# Patient Record
Sex: Male | Born: 1975 | Race: White | Hispanic: No | Marital: Single | Smoking: Former smoker
Health system: Southern US, Community
[De-identification: ages and names within clinical notes are randomized; demographics above are authoritative.]

## PROBLEM LIST (undated history)

## (undated) DIAGNOSIS — F329 Major depressive disorder, single episode, unspecified: Secondary | ICD-10-CM

## (undated) DIAGNOSIS — I10 Essential (primary) hypertension: Secondary | ICD-10-CM

## (undated) DIAGNOSIS — J449 Chronic obstructive pulmonary disease, unspecified: Secondary | ICD-10-CM

## (undated) DIAGNOSIS — K219 Gastro-esophageal reflux disease without esophagitis: Secondary | ICD-10-CM

## (undated) DIAGNOSIS — D649 Anemia, unspecified: Secondary | ICD-10-CM

## (undated) DIAGNOSIS — I82409 Acute embolism and thrombosis of unspecified deep veins of unspecified lower extremity: Secondary | ICD-10-CM

## (undated) DIAGNOSIS — F32A Depression, unspecified: Secondary | ICD-10-CM

## (undated) DIAGNOSIS — Z720 Tobacco use: Secondary | ICD-10-CM

## (undated) DIAGNOSIS — Z8719 Personal history of other diseases of the digestive system: Secondary | ICD-10-CM

## (undated) DIAGNOSIS — G2581 Restless legs syndrome: Secondary | ICD-10-CM

## (undated) DIAGNOSIS — Z87442 Personal history of urinary calculi: Secondary | ICD-10-CM

## (undated) DIAGNOSIS — N186 End stage renal disease: Secondary | ICD-10-CM

## (undated) DIAGNOSIS — I2699 Other pulmonary embolism without acute cor pulmonale: Secondary | ICD-10-CM

## (undated) DIAGNOSIS — F1010 Alcohol abuse, uncomplicated: Secondary | ICD-10-CM

## (undated) DIAGNOSIS — I712 Thoracic aortic aneurysm, without rupture: Secondary | ICD-10-CM

## (undated) DIAGNOSIS — J42 Unspecified chronic bronchitis: Secondary | ICD-10-CM

## (undated) DIAGNOSIS — Q613 Polycystic kidney, unspecified: Secondary | ICD-10-CM

## (undated) DIAGNOSIS — B20 Human immunodeficiency virus [HIV] disease: Secondary | ICD-10-CM

## (undated) DIAGNOSIS — G8929 Other chronic pain: Secondary | ICD-10-CM

## (undated) DIAGNOSIS — Z992 Dependence on renal dialysis: Secondary | ICD-10-CM

## (undated) DIAGNOSIS — R55 Syncope and collapse: Secondary | ICD-10-CM

## (undated) DIAGNOSIS — G629 Polyneuropathy, unspecified: Secondary | ICD-10-CM

## (undated) DIAGNOSIS — M549 Dorsalgia, unspecified: Secondary | ICD-10-CM

## (undated) HISTORY — DX: Polycystic kidney, unspecified: Q61.3

## (undated) HISTORY — PX: VASCULAR SURGERY: SHX849

## (undated) HISTORY — DX: Other pulmonary embolism without acute cor pulmonale: I26.99

## (undated) HISTORY — DX: Alcohol abuse, uncomplicated: F10.10

## (undated) HISTORY — DX: Anemia, unspecified: D64.9

## (undated) HISTORY — DX: Tobacco use: Z72.0

## (undated) HISTORY — DX: Human immunodeficiency virus (HIV) disease: B20

## (undated) HISTORY — PX: NEPHRECTOMY: SHX65

## (undated) HISTORY — DX: Essential (primary) hypertension: I10

## (undated) HISTORY — DX: Thoracic aortic aneurysm, without rupture: I71.2

---

## 1998-06-12 ENCOUNTER — Emergency Department (HOSPITAL_COMMUNITY): Admission: EM | Admit: 1998-06-12 | Discharge: 1998-06-12 | Payer: Self-pay | Admitting: Emergency Medicine

## 1998-08-24 ENCOUNTER — Emergency Department (HOSPITAL_COMMUNITY): Admission: EM | Admit: 1998-08-24 | Discharge: 1998-08-24 | Payer: Self-pay | Admitting: Emergency Medicine

## 1998-08-24 ENCOUNTER — Encounter: Payer: Self-pay | Admitting: Emergency Medicine

## 2000-06-01 ENCOUNTER — Emergency Department (HOSPITAL_COMMUNITY): Admission: EM | Admit: 2000-06-01 | Discharge: 2000-06-01 | Payer: Self-pay | Admitting: Emergency Medicine

## 2000-06-01 ENCOUNTER — Encounter: Payer: Self-pay | Admitting: Emergency Medicine

## 2000-06-13 ENCOUNTER — Encounter: Payer: Self-pay | Admitting: Emergency Medicine

## 2000-06-13 ENCOUNTER — Emergency Department (HOSPITAL_COMMUNITY): Admission: EM | Admit: 2000-06-13 | Discharge: 2000-06-13 | Payer: Self-pay | Admitting: Emergency Medicine

## 2000-11-04 ENCOUNTER — Emergency Department (HOSPITAL_COMMUNITY): Admission: EM | Admit: 2000-11-04 | Discharge: 2000-11-04 | Payer: Self-pay | Admitting: Emergency Medicine

## 2000-11-04 ENCOUNTER — Encounter: Payer: Self-pay | Admitting: Emergency Medicine

## 2001-06-11 ENCOUNTER — Emergency Department (HOSPITAL_COMMUNITY): Admission: EM | Admit: 2001-06-11 | Discharge: 2001-06-11 | Payer: Self-pay | Admitting: Emergency Medicine

## 2001-06-11 ENCOUNTER — Encounter: Payer: Self-pay | Admitting: Emergency Medicine

## 2001-06-22 ENCOUNTER — Emergency Department (HOSPITAL_COMMUNITY): Admission: EM | Admit: 2001-06-22 | Discharge: 2001-06-22 | Payer: Self-pay | Admitting: Emergency Medicine

## 2001-07-05 ENCOUNTER — Emergency Department (HOSPITAL_COMMUNITY): Admission: EM | Admit: 2001-07-05 | Discharge: 2001-07-05 | Payer: Self-pay | Admitting: Emergency Medicine

## 2001-07-21 ENCOUNTER — Encounter: Payer: Self-pay | Admitting: Emergency Medicine

## 2001-07-21 ENCOUNTER — Emergency Department (HOSPITAL_COMMUNITY): Admission: EM | Admit: 2001-07-21 | Discharge: 2001-07-21 | Payer: Self-pay | Admitting: Emergency Medicine

## 2003-11-15 ENCOUNTER — Emergency Department (HOSPITAL_COMMUNITY): Admission: EM | Admit: 2003-11-15 | Discharge: 2003-11-15 | Payer: Self-pay | Admitting: Emergency Medicine

## 2003-11-28 ENCOUNTER — Emergency Department (HOSPITAL_COMMUNITY): Admission: EM | Admit: 2003-11-28 | Discharge: 2003-11-28 | Payer: Self-pay | Admitting: Emergency Medicine

## 2005-10-19 ENCOUNTER — Emergency Department (HOSPITAL_COMMUNITY): Admission: EM | Admit: 2005-10-19 | Discharge: 2005-10-19 | Payer: Self-pay | Admitting: Emergency Medicine

## 2005-11-04 ENCOUNTER — Emergency Department (HOSPITAL_COMMUNITY): Admission: EM | Admit: 2005-11-04 | Discharge: 2005-11-04 | Payer: Self-pay | Admitting: Emergency Medicine

## 2007-10-28 ENCOUNTER — Emergency Department (HOSPITAL_COMMUNITY): Admission: EM | Admit: 2007-10-28 | Discharge: 2007-10-28 | Payer: Self-pay | Admitting: Emergency Medicine

## 2009-06-05 ENCOUNTER — Emergency Department (HOSPITAL_COMMUNITY): Admission: EM | Admit: 2009-06-05 | Discharge: 2009-06-05 | Payer: Self-pay | Admitting: Emergency Medicine

## 2009-07-01 ENCOUNTER — Emergency Department (HOSPITAL_COMMUNITY): Admission: EM | Admit: 2009-07-01 | Discharge: 2009-07-01 | Payer: Self-pay | Admitting: Emergency Medicine

## 2009-08-08 ENCOUNTER — Emergency Department (HOSPITAL_COMMUNITY): Admission: EM | Admit: 2009-08-08 | Discharge: 2009-08-08 | Payer: Self-pay | Admitting: Emergency Medicine

## 2010-02-28 ENCOUNTER — Ambulatory Visit: Payer: Self-pay | Admitting: Internal Medicine

## 2010-02-28 DIAGNOSIS — B20 Human immunodeficiency virus [HIV] disease: Secondary | ICD-10-CM

## 2010-02-28 LAB — CONVERTED CEMR LAB
ALT: 20 units/L (ref 0–53)
AST: 33 units/L (ref 0–37)
BUN: 30 mg/dL — ABNORMAL HIGH (ref 6–23)
Basophils Relative: 1 % (ref 0–1)
Bilirubin Urine: NEGATIVE
Calcium: 9.1 mg/dL (ref 8.4–10.5)
Chlamydia, Swab/Urine, PCR: NEGATIVE
Chloride: 101 meq/L (ref 96–112)
Cholesterol: 178 mg/dL (ref 0–200)
Creatinine, Ser: 1.84 mg/dL — ABNORMAL HIGH (ref 0.40–1.50)
Eosinophils Absolute: 0.1 10*3/uL (ref 0.0–0.7)
Eosinophils Relative: 1 % (ref 0–5)
HCT: 45.2 % (ref 39.0–52.0)
HCV Ab: NEGATIVE
HDL: 77 mg/dL (ref >39)
HIV 1 RNA Quant: 21300 copies/mL — ABNORMAL HIGH (ref <48)
HIV-2 Ab: UNDETERMINED — AB
HIV: REACTIVE
Hepatitis B Surface Ag: NEGATIVE
Lymphs Abs: 1.5 10*3/uL (ref 0.7–4.0)
MCHC: 33.8 g/dL (ref 30.0–36.0)
MCV: 98.7 fL (ref >=78.0)
Neutrophils Relative %: 60 % (ref 43–77)
Platelets: 168 10*3/uL (ref 150–400)
Protein, ur: >300 mg/dL — AB
RDW: 13.3 % (ref 11.5–15.5)
Total Bilirubin: 0.5 mg/dL (ref 0.3–1.2)
Total CHOL/HDL Ratio: 2.3
Urine Glucose: NEGATIVE mg/dL
Urobilinogen, UA: 1 (ref 0.0–1.0)
VLDL: 34 mg/dL (ref 0–40)

## 2010-03-07 ENCOUNTER — Emergency Department (HOSPITAL_COMMUNITY): Admission: EM | Admit: 2010-03-07 | Discharge: 2010-03-08 | Payer: Self-pay | Admitting: Emergency Medicine

## 2010-03-13 ENCOUNTER — Emergency Department (HOSPITAL_COMMUNITY): Admission: EM | Admit: 2010-03-13 | Discharge: 2010-03-13 | Payer: Self-pay | Admitting: Emergency Medicine

## 2010-03-16 DIAGNOSIS — B20 Human immunodeficiency virus [HIV] disease: Secondary | ICD-10-CM

## 2010-03-16 DIAGNOSIS — Z21 Asymptomatic human immunodeficiency virus [HIV] infection status: Secondary | ICD-10-CM

## 2010-03-16 HISTORY — DX: Human immunodeficiency virus (HIV) disease: B20

## 2010-03-16 HISTORY — DX: Asymptomatic human immunodeficiency virus (hiv) infection status: Z21

## 2010-03-23 ENCOUNTER — Ambulatory Visit: Payer: Self-pay | Admitting: Internal Medicine

## 2010-03-23 DIAGNOSIS — M25569 Pain in unspecified knee: Secondary | ICD-10-CM

## 2010-03-23 DIAGNOSIS — Q613 Polycystic kidney, unspecified: Secondary | ICD-10-CM

## 2010-03-27 ENCOUNTER — Encounter: Payer: Self-pay | Admitting: Internal Medicine

## 2010-04-30 ENCOUNTER — Encounter (INDEPENDENT_AMBULATORY_CARE_PROVIDER_SITE_OTHER): Payer: Self-pay | Admitting: *Deleted

## 2010-05-04 ENCOUNTER — Emergency Department (HOSPITAL_COMMUNITY): Admission: EM | Admit: 2010-05-04 | Discharge: 2010-05-04 | Payer: Self-pay | Admitting: Emergency Medicine

## 2010-05-16 ENCOUNTER — Encounter (INDEPENDENT_AMBULATORY_CARE_PROVIDER_SITE_OTHER): Payer: Self-pay | Admitting: *Deleted

## 2010-06-12 ENCOUNTER — Emergency Department (HOSPITAL_COMMUNITY): Admission: EM | Admit: 2010-06-12 | Discharge: 2010-06-12 | Payer: Self-pay | Admitting: Emergency Medicine

## 2010-06-16 ENCOUNTER — Emergency Department (HOSPITAL_COMMUNITY): Admission: EM | Admit: 2010-06-16 | Discharge: 2010-06-17 | Payer: Self-pay | Admitting: Emergency Medicine

## 2010-06-20 ENCOUNTER — Ambulatory Visit: Payer: Self-pay | Admitting: Internal Medicine

## 2010-06-20 ENCOUNTER — Telehealth: Payer: Self-pay

## 2010-06-20 LAB — CONVERTED CEMR LAB: HIV-1 RNA Quant, Log: 4.76 — ABNORMAL HIGH (ref <1.68)

## 2010-06-21 ENCOUNTER — Encounter: Payer: Self-pay | Admitting: Internal Medicine

## 2010-06-21 LAB — CONVERTED CEMR LAB
AST: 23 units/L (ref 0–37)
Alkaline Phosphatase: 65 units/L (ref 39–117)
Basophils Absolute: 0 10*3/uL (ref 0.0–0.1)
Basophils Relative: 0 % (ref 0–1)
Glucose, Bld: 84 mg/dL (ref 70–99)
Hemoglobin: 15.6 g/dL (ref 13.0–17.0)
MCHC: 33.8 g/dL (ref 30.0–36.0)
Monocytes Absolute: 0.7 10*3/uL (ref 0.1–1.0)
Neutro Abs: 2.9 10*3/uL (ref 1.7–7.7)
Neutrophils Relative %: 56 % (ref 43–77)
Platelets: 155 10*3/uL (ref 150–400)
RDW: 14.5 % (ref 11.5–15.5)
Sodium: 137 meq/L (ref 135–145)
Total Bilirubin: 0.6 mg/dL (ref 0.3–1.2)
Total Protein: 7.6 g/dL (ref 6.0–8.3)

## 2010-07-04 ENCOUNTER — Ambulatory Visit: Payer: Self-pay | Admitting: Internal Medicine

## 2010-07-16 ENCOUNTER — Ambulatory Visit (HOSPITAL_COMMUNITY): Admission: RE | Admit: 2010-07-16 | Discharge: 2010-07-16 | Payer: Self-pay | Admitting: Internal Medicine

## 2010-07-16 ENCOUNTER — Telehealth: Payer: Self-pay | Admitting: Internal Medicine

## 2010-07-23 ENCOUNTER — Telehealth: Payer: Self-pay | Admitting: Internal Medicine

## 2010-08-10 ENCOUNTER — Telehealth: Payer: Self-pay | Admitting: Internal Medicine

## 2010-08-13 ENCOUNTER — Ambulatory Visit: Payer: Self-pay | Admitting: Internal Medicine

## 2010-08-13 DIAGNOSIS — F322 Major depressive disorder, single episode, severe without psychotic features: Secondary | ICD-10-CM | POA: Insufficient documentation

## 2010-08-13 DIAGNOSIS — R21 Rash and other nonspecific skin eruption: Secondary | ICD-10-CM

## 2010-08-13 LAB — CONVERTED CEMR LAB

## 2010-08-14 ENCOUNTER — Encounter: Payer: Self-pay | Admitting: Internal Medicine

## 2010-08-15 ENCOUNTER — Telehealth (INDEPENDENT_AMBULATORY_CARE_PROVIDER_SITE_OTHER): Payer: Self-pay | Admitting: *Deleted

## 2010-08-29 ENCOUNTER — Telehealth: Payer: Self-pay | Admitting: Internal Medicine

## 2010-09-03 ENCOUNTER — Emergency Department (HOSPITAL_COMMUNITY): Admission: EM | Admit: 2010-09-03 | Discharge: 2010-09-03 | Payer: Self-pay | Admitting: Emergency Medicine

## 2010-09-03 ENCOUNTER — Telehealth: Payer: Self-pay | Admitting: Internal Medicine

## 2010-09-04 ENCOUNTER — Telehealth: Payer: Self-pay | Admitting: Internal Medicine

## 2010-09-07 ENCOUNTER — Telehealth: Payer: Self-pay | Admitting: Internal Medicine

## 2010-09-15 DIAGNOSIS — I712 Thoracic aortic aneurysm, without rupture, unspecified: Secondary | ICD-10-CM

## 2010-09-15 DIAGNOSIS — I2699 Other pulmonary embolism without acute cor pulmonale: Secondary | ICD-10-CM

## 2010-09-15 HISTORY — DX: Thoracic aortic aneurysm, without rupture: I71.2

## 2010-09-15 HISTORY — DX: Other pulmonary embolism without acute cor pulmonale: I26.99

## 2010-09-15 HISTORY — DX: Thoracic aortic aneurysm, without rupture, unspecified: I71.20

## 2010-09-22 ENCOUNTER — Inpatient Hospital Stay (HOSPITAL_COMMUNITY): Admission: AC | Admit: 2010-09-22 | Discharge: 2010-09-30 | Payer: Self-pay | Admitting: Emergency Medicine

## 2010-09-22 ENCOUNTER — Encounter: Payer: Self-pay | Admitting: Internal Medicine

## 2010-09-22 ENCOUNTER — Ambulatory Visit: Payer: Self-pay | Admitting: Cardiovascular Disease

## 2010-09-22 ENCOUNTER — Ambulatory Visit: Payer: Self-pay | Admitting: Internal Medicine

## 2010-09-23 ENCOUNTER — Encounter: Payer: Self-pay | Admitting: Internal Medicine

## 2010-09-27 ENCOUNTER — Telehealth: Payer: Self-pay | Admitting: Internal Medicine

## 2010-09-28 ENCOUNTER — Telehealth (INDEPENDENT_AMBULATORY_CARE_PROVIDER_SITE_OTHER): Payer: Self-pay | Admitting: *Deleted

## 2010-09-30 ENCOUNTER — Encounter: Payer: Self-pay | Admitting: Internal Medicine

## 2010-09-30 DIAGNOSIS — I2699 Other pulmonary embolism without acute cor pulmonale: Secondary | ICD-10-CM

## 2010-10-01 ENCOUNTER — Ambulatory Visit: Payer: Self-pay | Admitting: Internal Medicine

## 2010-10-01 DIAGNOSIS — I10 Essential (primary) hypertension: Secondary | ICD-10-CM

## 2010-10-01 DIAGNOSIS — I712 Thoracic aortic aneurysm, without rupture, unspecified: Secondary | ICD-10-CM | POA: Insufficient documentation

## 2010-10-01 DIAGNOSIS — F17200 Nicotine dependence, unspecified, uncomplicated: Secondary | ICD-10-CM

## 2010-10-01 LAB — CONVERTED CEMR LAB: INR: 2.9

## 2010-10-02 ENCOUNTER — Ambulatory Visit: Payer: Self-pay | Admitting: Internal Medicine

## 2010-10-02 LAB — CONVERTED CEMR LAB
HIV 1 RNA Quant: 173 copies/mL — ABNORMAL HIGH (ref <20)
HIV-1 RNA Quant, Log: 2.24 — ABNORMAL HIGH (ref <1.30)

## 2010-10-03 ENCOUNTER — Encounter: Payer: Self-pay | Admitting: Internal Medicine

## 2010-10-04 ENCOUNTER — Encounter: Payer: Self-pay | Admitting: Internal Medicine

## 2010-10-04 DIAGNOSIS — J984 Other disorders of lung: Secondary | ICD-10-CM

## 2010-10-04 LAB — CONVERTED CEMR LAB
ALT: 66 units/L — ABNORMAL HIGH (ref 0–53)
AST: 49 units/L — ABNORMAL HIGH (ref 0–37)
Albumin: 4.1 g/dL (ref 3.5–5.2)
Basophils Absolute: 0 10*3/uL (ref 0.0–0.1)
CO2: 22 meq/L (ref 19–32)
Calcium: 8.7 mg/dL (ref 8.4–10.5)
Chloride: 104 meq/L (ref 96–112)
Eosinophils Relative: 3 % (ref 0–5)
Lymphocytes Relative: 50 % — ABNORMAL HIGH (ref 12–46)
Lymphs Abs: 1.6 10*3/uL (ref 0.7–4.0)
Neutro Abs: 1.1 10*3/uL — ABNORMAL LOW (ref 1.7–7.7)
Neutrophils Relative %: 35 % — ABNORMAL LOW (ref 43–77)
Platelets: 235 10*3/uL (ref 150–400)
Potassium: 5.1 meq/L (ref 3.5–5.3)
RDW: 18.5 % — ABNORMAL HIGH (ref 11.5–15.5)
Total Protein: 7.7 g/dL (ref 6.0–8.3)
WBC: 3.2 10*3/uL — ABNORMAL LOW (ref 4.0–10.5)

## 2010-10-05 ENCOUNTER — Telehealth: Payer: Self-pay | Admitting: Licensed Clinical Social Worker

## 2010-10-08 ENCOUNTER — Telehealth (INDEPENDENT_AMBULATORY_CARE_PROVIDER_SITE_OTHER): Payer: Self-pay | Admitting: *Deleted

## 2010-10-08 ENCOUNTER — Ambulatory Visit: Payer: Self-pay | Admitting: Internal Medicine

## 2010-10-08 ENCOUNTER — Telehealth: Payer: Self-pay | Admitting: Internal Medicine

## 2010-10-08 ENCOUNTER — Encounter: Payer: Self-pay | Admitting: Internal Medicine

## 2010-10-09 ENCOUNTER — Encounter: Payer: Self-pay | Admitting: Licensed Clinical Social Worker

## 2010-10-16 ENCOUNTER — Ambulatory Visit: Payer: Self-pay | Admitting: Internal Medicine

## 2010-10-17 ENCOUNTER — Telehealth: Payer: Self-pay | Admitting: Internal Medicine

## 2010-10-26 ENCOUNTER — Telehealth: Payer: Self-pay | Admitting: Internal Medicine

## 2010-10-26 ENCOUNTER — Encounter: Payer: Self-pay | Admitting: Internal Medicine

## 2010-11-07 ENCOUNTER — Ambulatory Visit: Payer: Self-pay | Admitting: Internal Medicine

## 2010-11-07 DIAGNOSIS — S335XXA Sprain of ligaments of lumbar spine, initial encounter: Secondary | ICD-10-CM

## 2010-11-07 DIAGNOSIS — F1010 Alcohol abuse, uncomplicated: Secondary | ICD-10-CM | POA: Insufficient documentation

## 2010-11-07 DIAGNOSIS — K029 Dental caries, unspecified: Secondary | ICD-10-CM | POA: Insufficient documentation

## 2010-11-07 LAB — CONVERTED CEMR LAB: INR: 2.4

## 2010-11-12 ENCOUNTER — Ambulatory Visit: Payer: Self-pay | Admitting: Internal Medicine

## 2010-11-13 ENCOUNTER — Telehealth: Payer: Self-pay | Admitting: Internal Medicine

## 2010-11-23 ENCOUNTER — Encounter: Payer: Self-pay | Admitting: Internal Medicine

## 2010-12-03 ENCOUNTER — Ambulatory Visit: Payer: Self-pay | Admitting: Internal Medicine

## 2010-12-03 LAB — CONVERTED CEMR LAB

## 2010-12-07 ENCOUNTER — Emergency Department (HOSPITAL_COMMUNITY)
Admission: EM | Admit: 2010-12-07 | Discharge: 2010-12-07 | Payer: Self-pay | Source: Home / Self Care | Admitting: Emergency Medicine

## 2010-12-17 ENCOUNTER — Ambulatory Visit: Payer: Self-pay | Admitting: Internal Medicine

## 2010-12-17 DIAGNOSIS — N186 End stage renal disease: Secondary | ICD-10-CM | POA: Insufficient documentation

## 2010-12-19 ENCOUNTER — Telehealth (INDEPENDENT_AMBULATORY_CARE_PROVIDER_SITE_OTHER): Payer: Self-pay | Admitting: *Deleted

## 2010-12-23 LAB — CONVERTED CEMR LAB
BUN: 46 mg/dL — ABNORMAL HIGH (ref 6–23)
CO2: 23 meq/L (ref 19–32)
Creatinine, Urine: 56.5 mg/dL
Glucose, Bld: 100 mg/dL — ABNORMAL HIGH (ref 70–99)
Hemoglobin: 11.2 g/dL — ABNORMAL LOW (ref 13.0–17.0)
Lymphs Abs: 1.6 10*3/uL (ref 0.7–4.0)
MCV: 113.1 fL — ABNORMAL HIGH (ref 78.0–100.0)
Monocytes Absolute: 0.3 10*3/uL (ref 0.1–1.0)
Monocytes Relative: 8 % (ref 3–12)
Neutro Abs: 2.3 10*3/uL (ref 1.7–7.7)
Neutrophils Relative %: 54 % (ref 43–77)
RBC: 2.91 M/uL — ABNORMAL LOW (ref 4.22–5.81)
Sodium: 136 meq/L (ref 135–145)
Total Bilirubin: 0.3 mg/dL (ref 0.3–1.2)
Total Protein, Urine: 127
Total Protein: 7.4 g/dL (ref 6.0–8.3)
WBC: 4.3 10*3/uL (ref 4.0–10.5)

## 2010-12-28 ENCOUNTER — Encounter: Payer: Self-pay | Admitting: Internal Medicine

## 2011-01-03 ENCOUNTER — Other Ambulatory Visit: Payer: Self-pay | Admitting: Internal Medicine

## 2011-01-03 DIAGNOSIS — R911 Solitary pulmonary nodule: Secondary | ICD-10-CM

## 2011-01-05 DIAGNOSIS — I2699 Other pulmonary embolism without acute cor pulmonale: Secondary | ICD-10-CM

## 2011-01-05 DIAGNOSIS — Z7901 Long term (current) use of anticoagulants: Secondary | ICD-10-CM | POA: Insufficient documentation

## 2011-01-07 ENCOUNTER — Ambulatory Visit: Admission: RE | Admit: 2011-01-07 | Discharge: 2011-01-07 | Payer: Self-pay | Source: Home / Self Care

## 2011-01-07 LAB — CONVERTED CEMR LAB: INR: 3.4

## 2011-01-11 ENCOUNTER — Ambulatory Visit: Admission: RE | Admit: 2011-01-11 | Discharge: 2011-01-11 | Payer: Self-pay | Source: Home / Self Care

## 2011-01-16 LAB — CONVERTED CEMR LAB
BUN: 24 mg/dL — ABNORMAL HIGH (ref 6–23)
Chloride: 109 meq/L (ref 96–112)
Glucose, Bld: 94 mg/dL (ref 70–99)
Phosphorus: 4.5 mg/dL (ref 2.3–4.6)
Potassium: 5 meq/L (ref 3.5–5.3)
Sodium: 138 meq/L (ref 135–145)

## 2011-01-17 ENCOUNTER — Telehealth (INDEPENDENT_AMBULATORY_CARE_PROVIDER_SITE_OTHER): Payer: Self-pay | Admitting: *Deleted
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
## 2011-01-17 NOTE — Consult Note (Signed)
Summary: [facility]   Imported By: Bonner Puna 11/14/2010 15:41:32  _____________________________________________________________________  External Attachment:    Type:   Image     Comment:   External Document

## 2011-01-17 NOTE — Miscellaneous (Signed)
Summary: HIPAA Restrictions  HIPAA Restrictions   Imported By: Bonner Puna 03/23/2010 15:48:53  _____________________________________________________________________  External Attachment:    Type:   Image     Comment:   External Document

## 2011-01-17 NOTE — Miscellaneous (Signed)
Summary: clinical update/ryan white NCADAP apprv til 03/16/11  Clinical Lists Changes  Observations: Added new observation of AIDSDAP: Yes 2011 (05/16/2010 16:51)

## 2011-01-17 NOTE — Progress Notes (Signed)
Summary: regarding ADAP meds  Phone Note Call from Patient   Caller: Patient Summary of Call: Pt. called because he did not receive a call from [facility] about ADAP meds, explained to pt. that going foward they would be shipped to [facility] on [address].  Gave pt. pharmacy number and told him to call me back if he had any problems. Initial call taken by: Myrtis Hopping CMA [name]),  December 19, 2010 4:46 PM

## 2011-01-17 NOTE — Miscellaneous (Signed)
  Clinical Lists Changes  Observations: Added new observation of SOCIAL HX: Homeless since 8-9 months- todays date 09/21/10- as stopped job before 1 year due to his L knee problem as per above. Chronic alcoholic- used to drink a gallon of vodka/day but cut down to 1/5th of a gallon to a pint /day of vodka now. Smokes 1 PPD since 12-13 yrs now. Gets food stamps for food. Grandmother from mom side provides some money. Denies any cocaine use, but had marijuana last in July/11 (09/22/2010 3:53)      Complete Medication List: 1)  Combivir 150-300 Mg Tabs (Lamivudine-zidovudine) .... Take 1 tablet by mouth two times a day 2)  Sustiva 600 Mg Tabs (Efavirenz) .... Take 1 tablet by mouth at bedtime 3)  Vicodin 5-500 Mg Tabs (Hydrocodone-acetaminophen) .... Take 1 tablet by mouth every 8 hours as needed 4)  Campral 333 Mg Tbec (Acamprosate calcium) .... Take 1 tablet by mouth three times a day 5)  Celexa 20 Mg Tabs (Citalopram hydrobromide) .... Take 1 tablet by mouth once a day 6)  Zestril 10 Mg Tabs (Lisinopril) .... Take 1 tablet by mouth once a day 7)  Baclofen 20 Mg Tabs (Baclofen) .... Take 1 tablet by mouth once a day at bedtime 8)  Trazodone Hcl 100 Mg Tabs (Trazodone hcl) .... Take one tablet one to three times per day 9)  Claritin 10 Mg Tabs (Loratadine) .... Take 1 tablet by mouth once a day 10)  Clotrimazole 1 % Crea (Clotrimazole) .... Apply two times a day 11)  Triamcinolone Acetonide 0.1 % Crea (Triamcinolone acetonide) .... Apply two times a day   Social History: Homeless since 8-9 months- todays date 09/21/10- as stopped job before 1 year due to his L knee problem as per above. Chronic alcoholic- used to drink a gallon of vodka/day but cut down to 1/5th of a gallon to a pint /day of vodka now. Smokes 1 PPD since 12-13 yrs now. Gets food stamps for food. Grandmother from mom side provides some money. Denies any cocaine use, but had marijuana last in July/11

## 2011-01-17 NOTE — Progress Notes (Signed)
Summary: ncadap meds arrived for Aug--unable to reach pt  Phone Note Refill Request      Prescriptions: SUSTIVA 600 MG TABS (EFAVIRENZ) Take 1 tablet by mouth at bedtime  #30 x 0   Entered by:   Canary Brim  BS,CPht II,MPH   Authorized by:   Aldona Bar MD   Signed by:   Canary Brim  BS,CPht II,MPH on 08/10/2010   Method used:   Samples Given   RxID:   [id number] COMBIVIR 150-300 MG TABS (LAMIVUDINE-ZIDOVUDINE) Take 1 tablet by mouth two times a day  #60 x 0   Entered by:   Canary Brim  BS,CPht II,MPH   Authorized by:   Aldona Bar MD   Signed by:   Canary Brim  BS,CPht II,MPH on 08/10/2010   Method used:   Samples Given   RxID:   [id number]  Patient Assist Medication Verification: Medication name: combivir 150/300mg  RX # [id number] Tech approval:MLD  Patient Assist Medication Verification: Medication name:Sustiva 600mg  RX # [id number] Tech approval:MLD Tried to reach patient.  Was unable to leave a message . Canary Brim  BS,CPht II,MPH  August 10, 2010 12:17 PM

## 2011-01-17 NOTE — Progress Notes (Signed)
Summary: refill Vicodin  Phone Note Call from Patient   Caller: Patient Summary of Call: Pt.requesting refill of Vicodin.  Went to [facility] ortho clinic but did not have CD from MRI and scheduled another appt. for 5 weeks,but did not prescribe anything for pain.  Too early, last filled 08/13/10 Initial call taken by: Myrtis Hopping CMA [name]),  September 03, 2010 4:18 PM  Follow-up for Phone Call        Pt. said he has been having to take at least two per day for pain and thought it was a 10 day supply.  I told him it was as needed, but he is out. Follow-up by: Myrtis Hopping CMA [name]),  September 04, 2010 9:24 AM

## 2011-01-17 NOTE — Assessment & Plan Note (Signed)
Summary: new 042 intake    Infectious Disease New Patient Intake Referring MD/Agency: [facility]  Address: [place], [place]   Return Appointment Date: 03/23/2010  With Physician: Tomma Lightning Medical Records: [place] / Payor: No Insurance Employer: Unemployed       Do you have a Primary physician: No Are family members aware of patient's diagnosis?  If so, are they supportive? None Describe patient's current social support (family, friends, support groups): good  Medical History Medical Problems OTHER than HIV: Yes  Problems:  HIV INFECTION (ICD-042)  Medication Allergies: No   Medications: No   Medical Hx Comments: Pt reports he was diagnosed with polycystic kidneys at [facility]. 4 years ago.  Family History Kidney Disease:   Family Side: Paternal  Comments: grandfather   Tobacco use: current Amt: 1 packs per day. Counseled to quit/cut down: yes  Behavioral Health Assessment Have you ever been diagnosed with depression or mental illness? No  Do you drink alcohol? Yes Frequency: daily  Alcohol Beverage Type(s): 1/4 of a fifth daily  Do you use recreational drugs? No Drugs: previous hx of marijuana Do you feel you have a problem with drugs and/or alcohol? No   Have you ever been in a treatment facility for any addiction? No Behavioral Health Comments: Pt did ask about support groups.  THP info. given  HIV Intake Information When did you first test positive for HIV? 11/16/2009 Type of test Conducted: WB   Where was this test performed?  Name of Agency: [facility] Dept  City/State: [place] Was this your first time ever being tested or HIV? Yes Risk Factor(s) for HIV: Heterosexual contact  Method of Exposure to HIV: Heterosexual Intercourse Have you ever been hospitalized for any HIV-related condition? No  Have you ever been under the care of a physician for being HIV positive? No  Newly Diagnosed Patients Has a Disease  Intervention Specialist from the Health Department contacted the patient? Yes.   The patient has been informed that the [facility] will contact ALL newly reported cases. Health Department Contact:  [phone]   (SSN is needed for confirmation)  Reported Today: 12/22/2009 Health Department Contact:  [phone]            (SSN is needed for confirmation)  Person Reporting: [facility] Do you have any Non-HIV related medical conditions or other prior hospitalizations or surgeries? No  HIV Medications Information The patient is currently NOT taking any HIV medications.  Infection History  Patient has been diagnosed with the following opportunistic infections: Have you received literature/education prior to this visit about HIV/AIDS? Yes Do you understand the meaning of a Viral Load? No Do you understand the meaning of a CD4 count? No Initial CD4 Result: 553 Date: 12/22/2009 Lab Values Education/Handout Given Yes Medication Education/Handout Given Yes  Sexual History Are you in a current relationship? No Are you currently sexually active? No Safe Sex Counseling/Pamphlet Given  Evaluation and Follow-Up INTAKE CHECK LIST: HIV Education, Safe Sex Counseling, Case Management Referral, HIV Material Given  Prevention For Positives: 02/28/2010   Safe sex practices discussed with patient. Condoms offered. Are you in need of condoms at this time? No Our patient has been informed that condoms are always available in this clinic.   Brochure Provided for Above Organizations? Yes Name of Agency: [facility] Does patient have problems that warrant Social Worker referral? No             Prevention For Positives: 02/28/2010  Safe sex practices discussed with patient. Condoms offered.        02/28/2010   Patient was screened for substance abuse and depression. Referal was made as indicated.                     Immunization History:  Hepatitis B Immunization History:    Hepatitis B # 1:   historical (12/22/2009)  Influenza Immunization History:    Influenza:  historical (12/22/2009)  Hepatitis A Immunization History:    Hepatitis A # 1:  historical (12/22/2009)  Pneumovax Immunization History:    Pneumovax:  historical (12/22/2009)  PPD Results    Date of reading: 12/22/2009    Results: < 70mm    Interpretation: negative PPD Given by [facility]

## 2011-01-17 NOTE — Miscellaneous (Signed)
Summary: Orders Update  Clinical Lists Changes  Orders: Added new Referral order of Misc. Referral (Misc. Ref) - Signed 

## 2011-01-17 NOTE — Progress Notes (Signed)
  Phone Note Outgoing Call   Summary of Call: Najee was not at home.  I will try and get him next week.   Follow-up for Phone Call        Called again but patient was not at home.  I will send him a letter to call soc work re: smoking cessation.

## 2011-01-17 NOTE — Progress Notes (Signed)
Summary: ncadap meds arrived for Sept--left msg for pt to call office  Phone Note Refill Request      Prescriptions: SUSTIVA 600 MG TABS (EFAVIRENZ) Take 1 tablet by mouth at bedtime  #30 x 0   Entered by:   Canary Brim  BS,CPht II,MPH   Authorized by:   Aldona Bar MD   Signed by:   Canary Brim  BS,CPht II,MPH on 09/07/2010   Method used:   Samples Given   RxID:   [id number] COMBIVIR 150-300 MG TABS (LAMIVUDINE-ZIDOVUDINE) Take 1 tablet by mouth two times a day  #60 x 0   Entered by:   Canary Brim  BS,CPht II,MPH   Authorized by:   Aldona Bar MD   Signed by:   Canary Brim  BS,CPht II,MPH on 09/07/2010   Method used:   Samples Given   RxID:   [id number]  Patient Assist Medication Verification: Medication name:combivir 150/300mg  RX # [id number] Tech approval:MLD  Patient Assist Medication Verification: Medication name:Sustiva 600mg  RX # [id number] Tech approval:MLD Call placed to patient with message that assistance medications are ready for pick-up. Left message on patient's vm to call office Canary Brim  BS,CPht II,MPH  September 07, 2010 9:47 AM

## 2011-01-17 NOTE — Progress Notes (Signed)
Summary: seen at ED  Phone Note Call from Patient   Caller: Patient Summary of Call: Pt. ended up going to the [facility] ED for large abcess on left knee and it had to be packed.  Will return in a few days to have packing removed.  Wanted Dr. Tomma Lightning to know he was given Vicodin 5-325 mg. #10. Will print ED report. Initial call taken by: Myrtis Hopping CMA [name]),  September 04, 2010 3:36 PM

## 2011-01-17 NOTE — Miscellaneous (Signed)
Summary: Orders Update  Clinical Lists Changes  Orders: Added new Referral order of Nephrology Referral (Nephro) - Signed

## 2011-01-17 NOTE — Progress Notes (Signed)
Summary: ortho. referral  Phone Note Outgoing Call   Call placed by: Kennyth Lose Summary of Call: Called pt. to let him know about orthopedic appt. at [facility] clinic for 08/31/10 at 10:00 AM and also for him to speak with Dorian Pod case mngr. at [facility] if he needs help with transportation. Initial call taken by: Myrtis Hopping CMA [name]),  August 15, 2010 8:57 AM

## 2011-01-17 NOTE — Assessment & Plan Note (Signed)
Summary: side effects from meds/jc   CC:  pt. c/o nausea, diarrhea, and rash on palms, left knee, and and groin area morning after starting HIV meds.  History of Present Illness: Pt was hospitalized last week for severe depression. He feels better from that perspective.  He recently devloped a rash on his palms and his leg and groin area.  The rash on his palms is new and is not pruritic.  The rash on his legs and groin area has been there for a while but has worsened. The rashes were present before starting his Combivir and Sustiva which he just started a couple of days ago.  He also c/o some nausea but was started on meds for depression. No abdominal pain or fever.  Preventive Screening-Counseling & Management  Alcohol-Tobacco     Alcohol drinks/day: daily      Alcohol type: vodka     Smoking Status: current     Smoking Cessation Counseling: yes     Packs/Day: 0.5  Caffeine-Diet-Exercise     Caffeine use/day: coffee one a day     Does Patient Exercise: no  Safety-Violence-Falls     Seat Belt Use: yes     Helmet Use: yes      Sexual History:  n/a.        Drug Use:  never.     Updated Prior Medication List: COMBIVIR 150-300 MG TABS (LAMIVUDINE-ZIDOVUDINE) Take 1 tablet by mouth two times a day SUSTIVA 600 MG TABS (EFAVIRENZ) Take 1 tablet by mouth at bedtime VICODIN 5-500 MG TABS (HYDROCODONE-ACETAMINOPHEN) Take 1 tablet by mouth every 8 hours as needed CAMPRAL 333 MG TBEC (ACAMPROSATE CALCIUM) Take 1 tablet by mouth three times a day CELEXA 20 MG TABS (CITALOPRAM HYDROBROMIDE) Take 1 tablet by mouth once a day ZESTRIL 10 MG TABS (LISINOPRIL) Take 1 tablet by mouth once a day BACLOFEN 20 MG TABS (BACLOFEN) Take 1 tablet by mouth once a day at bedtime TRAZODONE HCL 100 MG TABS (TRAZODONE HCL) take one tablet one to three times per day CLARITIN 10 MG TABS (LORATADINE) Take 1 tablet by mouth once a day LOTRISONE 1-0.05 % CREA (CLOTRIMAZOLE-BETAMETHASONE) apply two times a  day  Current Allergies: No known allergies  Review of Systems  The patient denies anorexia, fever, weight loss, abdominal pain, melena, and hematochezia.    Vital Signs:  Patient profile:   35 year old male Height:      75 inches (190.50 cm) Weight:      191.8 pounds (87.18 kg) BMI:     24.06 Temp:     97.6 degrees F (36.44 degrees C) oral Pulse rate:   72 / minute BP sitting:   147 / 94  (left arm)  Vitals Entered By: Myrtis Hopping CMA [name]) (August 13, 2010 3:02 PM) CC: pt. c/o nausea, diarrhea, and rash on palms, left knee, and groin area morning after starting HIV meds Is Patient Diabetic? No Pain Assessment Patient in pain? yes     Location: left knee Intensity: 6 Type: aching Onset of pain  Constant Nutritional Status BMI of 19 -24 = normal Nutritional Status Detail appetite "ok"  Does patient need assistance? Functional Status Self care Ambulation Normal Comments pt. started HIV meds 08/08/10, [facility] 07/30/10-08/06/10   Physical Exam  General:  alert, well-developed, well-nourished, and well-hydrated.   Head:  normocephalic and atraumatic.   Mouth:  pharynx pink and moist.   Skin:  erythema of palms bilaterally raised erythematous rash  on leg and groin area   Impression & Recommendations:  Problem # 1:  SKIN RASH (ICD-782.1) looks fungal will treat with lotrisone check RPR His updated medication list for this problem includes:    Lotrisone 1-0.05 % Crea (Clotrimazole-betamethasone) .[name]... 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 Patient Level III [id number])  Problem # 4:  KNEE PAIN, LEFT (ICD-719.46) await referralto Ortho His updated medication list  for this problem includes:    Vicodin 5-500 Mg Tabs (Hydrocodone-acetaminophen) .[name]... Take 1 tablet by mouth every 8 hours as needed    Baclofen 20 Mg Tabs (Baclofen) .[name]... Take 1 tablet by mouth once a day at bedtime  Medications Added to Medication List This Visit: 1)  Campral 333 Mg Tbec (Acamprosate calcium) .... Take 1 tablet by mouth three times a day 2)  Celexa 20 Mg Tabs (Citalopram hydrobromide) .... Take 1 tablet by mouth once a day 3)  Zestril 10 Mg Tabs (Lisinopril) .... Take 1 tablet by mouth once a day 4)  Baclofen 20 Mg Tabs (Baclofen) .... Take 1 tablet by mouth once a day at bedtime 5)  Trazodone Hcl 100 Mg Tabs (Trazodone hcl) .... Take one tablet one to three times per day 6)  Claritin 10 Mg Tabs (Loratadine) .... Take 1 tablet by mouth once a day 7)  Lotrisone 1-0.05 % Crea (Clotrimazole-betamethasone) .... Apply two times a day  Patient Instructions: 1)  re-schedule lab app tfor 6 weeks from now (orders in lab) and f/u with me 2 weeks later Prescriptions: LOTRISONE 1-0.05 % CREA (CLOTRIMAZOLE-BETAMETHASONE) apply two times a day  #60gm x 0   Entered and Authorized by:   Aldona Bar MD   Signed by:   Aldona Bar MD on 08/13/2010   Method used:   Print then Give to Patient   RxID:   [id number] CLARITIN 10 MG TABS (LORATADINE) Take 1 tablet by mouth once a day  #30 x 0   Entered and Authorized by:   Aldona Bar MD   Signed by:   Aldona Bar MD on 08/13/2010   Method used:   Print then Give to Patient   RxID:   [id number] VICODIN 5-500 MG TABS (HYDROCODONE-ACETAMINOPHEN) Take 1 tablet by mouth every 8 hours as needed  #30 x 0   Entered and Authorized by:   Aldona Bar MD   Signed by:   Aldona Bar MD on 08/13/2010   Method used:   Print then Give to Patient   RxID:   [id number]
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
## 2011-01-17 NOTE — Miscellaneous (Signed)
Summary: Orders Update  Clinical Lists Changes  Problems: Added new problem of CHRONIC KIDNEY DISEASE STAGE IV (SEVERE) (ICD-585.4) Orders: Added new Test order of T-CMP with Estimated GFR ([phone]) - Signed Added new Test order of T-CBC w/Diff [phone]) - Signed Added new Test order of T-Phosphorus [id number]) - Signed Added new Test order of T-Urine Protein [phone]) - Signed Added new Test order of T-Urine Creatinine [id number]) - Signed     Process Orders Check Orders Results:     [facility]: [id number] not required for this insurance Tests Sent for requisitioning (December 23, 2010 10:46 PM):     12/17/2010: [facility] -- T-CMP with Estimated GFR [[id number]] (signed)     12/17/2010: [facility] -- T-CBC w/Diff [id number] (signed)     12/17/2010: [facility] -- T-Phosphorus [id number] (signed)     12/17/2010: [facility] -- T-Urine Protein [phone] (signed)     12/17/2010: [facility] -- T-Urine Creatinine [[id number]] (signed)   Labs not ordered by me. Will fax results to Dr. Clover Mealy.

## 2011-01-17 NOTE — Assessment & Plan Note (Signed)
Summary: 82MONTH F/U/VS   CC:  follow-up visit, lab results, and pt. c/o left leg pain.  History of Present Illness: Pt continues to have left knee pain.  It keeps popping in and out.  He is trying to get an orange card so he can get a MRI. He is ready to start meds.  Preventive Screening-Counseling & Management  Alcohol-Tobacco     Alcohol drinks/day: daily      Alcohol type: vodka     Smoking Status: current     Smoking Cessation Counseling: yes     Packs/Day: 0.5  Caffeine-Diet-Exercise     Caffeine use/day: coffee one a day     Does Patient Exercise: no  Safety-Violence-Falls     Seat Belt Use: yes     Helmet Use: yes      Sexual History:  n/a.        Drug Use:  never.    Comments: pt. declined condoms   Updated Prior Medication List: COMBIVIR 150-300 MG TABS (LAMIVUDINE-ZIDOVUDINE) Take 1 tablet by mouth two times a day SUSTIVA 600 MG TABS (EFAVIRENZ) Take 1 tablet by mouth at bedtime  Current Allergies (reviewed today): No known allergies  Review of Systems  The patient denies anorexia, fever, and weight loss.    Vital Signs:  Patient profile:   35 year old male Height:      75 inches (190.50 cm) Weight:      194.4 pounds (88.36 kg) BMI:     24.39 Temp:     97.9 degrees F (36.61 degrees C) oral Pulse rate:   76 / minute BP sitting:   137 / 95  (right arm)  Vitals Entered By: Myrtis Hopping CMA [name]) (July 04, 2010 10:15 AM) CC: follow-up visit, lab results, pt. c/o left leg pain Is Patient Diabetic? No Pain Assessment Patient in pain? yes     Location: left leg Intensity: 6 Type: aching Onset of pain  Constant Nutritional Status BMI of 19 -24 = normal Nutritional Status Detail appetite "ok"  Does patient need assistance? Functional Status Self care Ambulation Normal Comments pt. still waiting for MRI, has been approved for [employer], just needs to get Discount Card   Physical Exam  General:  alert, well-developed,  well-nourished, and well-hydrated.   Head:  normocephalic and atraumatic.   Mouth:  pharynx pink and moist.   Lungs:  normal breath sounds.          Medication Adherence: 07/04/2010   Adherence to medications reviewed with patient. Counseling to provide adequate adherence provided                                Impression & Recommendations:  Problem # 1:  HIV INFECTION (ICD-042) CD4ct down to 380.  Pt ready to start antiretroviral therapy.  Will avoid Viread due to history of kidney disease and abnormal creatinine. Pt does not want to be on epzicom.  will start Sustiva and Combivir.  Potential side effects discussed.  He will return in 6 weeks for labs. Diagnostics Reviewed:  HIV: REACTIVE (02/28/2010)   HIV-Western blot: Positive (02/28/2010)   CD4: 380 (06/21/2010)   WBC: 5.2 (06/21/2010)   Hgb: 15.6 (06/21/2010)   HCT: 46.1 (06/21/2010)   Platelets: 155 (06/21/2010) HIV genotype: * (02/28/2010)   HIV-1 RNA: 58200 (06/20/2010)   HBSAg: NEG (02/28/2010)  Orders: Est. Patient Level III ([id number])Future Orders: T-CD4SP ([facility]) (CD4SP) ... 08/15/2010 T-HIV  Viral Load [phone]) ... 08/15/2010 T-Comprehensive Metabolic Panel ([id number]) ... 08/15/2010 T-CBC w/Diff [id number]) ... 08/15/2010  Problem # 2:  KNEE PAIN, LEFT (ICD-719.46) awaiting MRI  Medications Added to Medication List This Visit: 1)  Combivir 150-300 Mg Tabs (Lamivudine-zidovudine) .... Take 1 tablet by mouth two times a day 2)  Sustiva 600 Mg Tabs (Efavirenz) .... Take 1 tablet by mouth at bedtime  Patient Instructions: 1)  Please schedule a follow-up appointment in 8 weeks, 2 weeks after labs.
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
## 2011-01-17 NOTE — Assessment & Plan Note (Signed)
Summary: COU/CH  Anticoagulant Therapy Managed by: Dorene Grebe. Ceasar Lund  PharmD [place] Attending: Bertha Stakes MD Indication 1: Pulmonary  embolus Indication 2: Encounter for therapeutic drug monitoring  V58.83 Start date: 09/22/2010 Duration: 6 months  Patient Assessment Reviewed by: [name] PharmD  December 03, 2010 Medication review: verified warfarin dosage & schedule,verified previous prescription medications, verified doses & any changes, verified new medications, reviewed OTC medications, reviewed OTC health products-vitamins supplements etc Complications: none Dietary changes: none   Health status changes: none   Lifestyle changes: none   Recent/future hospitalizations: none   Recent/future procedures: none   Recent/future dental: none Patient Assessment Part 2:  Have you MISSED ANY DOSES or CHANGED TABLETS?  No missed Warfarin doses or changed tablets.  Have you had any BRUISING or BLEEDING ( nose or gum bleeds,blood in urine or stool)?  No reported bruising or bleeding in nose, gums, urine, stool.  Have you STARTED or STOPPED any MEDICATIONS, including OTC meds,herbals or supplements?  No other medications or herbal supplements were started or stopped.  Have you CHANGED your DIET, especially green vegetables,or ALCOHOL intake?  No changes in diet or alcohol intake.  Have you had any ILLNESSES or HOSPITALIZATIONS?  No reported illnesses or hospitalizations  Have you had any signs of CLOTTING?(chest discomfort,dizziness,shortness of breath,arms tingling,slurred speech,swelling or redness in leg)    No chest discomfort, dizziness, shortness of breath, tingling in arm, slurred speech, swelling, or redness in leg.     Treatment  Target INR: 2.0-3.0 INR: 1.9  Date: 12/03/2010 Regimen In:  80.0mg /week INR reflects regimen in: 1.9  New  Tablet strength: : 5mg  Regimen Out:     Sunday: 2 Tablet     Monday: 3 Tablet     Tuesday: 2 Tablet     Wednesday: 3  Tablet     Thursday: 2 Tablet      Friday: 3 Tablet     Saturday: 2 Tablet Total Weekly: 85.0mg /week mg  Next INR Due: 12/17/2010 Adjusted by: Dorene Grebe. Elie Confer III PharmD CACP   Return to anticoagulation clinic:  12/17/2010 Time of next visit: 1500    Allergies: No Known Drug Allergies

## 2011-01-17 NOTE — Assessment & Plan Note (Signed)
Summary: 261/CFB  Anticoagulant Therapy Managed by: Dorene Grebe. Brian [name]  PharmD [place] Attending: Lynnae January MD, Benjamine Mola Indication 1: Pulmonary  embolus Indication 2: Encounter for therapeutic drug monitoring  V58.83 Start date: 09/22/2010 Duration: 6 months  Patient Assessment Reviewed by: [name] PharmD  October 01, 2010 Medication review: verified warfarin dosage & schedule,verified previous prescription medications, verified doses & any changes, verified new medications, reviewed OTC medications, reviewed OTC health products-vitamins supplements etc Complications: none Dietary changes: none   Health status changes: none   Lifestyle changes: none   Recent/future hospitalizations: none   Recent/future procedures: none   Recent/future dental: none Patient Assessment Part 2:  Have you MISSED ANY DOSES or CHANGED TABLETS?  No missed Warfarin doses or changed tablets.  Have you had any BRUISING or BLEEDING ( nose or gum bleeds,blood in urine or stool)?  No reported bruising or bleeding in nose, gums, urine, stool.  Have you STARTED or STOPPED any MEDICATIONS, including OTC meds,herbals or supplements?  No other medications or herbal supplements were started or stopped.  Have you CHANGED your DIET, especially green vegetables,or ALCOHOL intake?  No changes in diet or alcohol intake.  Have you had any ILLNESSES or HOSPITALIZATIONS?  No reported illnesses or hospitalizations  Have you had any signs of CLOTTING?(chest discomfort,dizziness,shortness of breath,arms tingling,slurred speech,swelling or redness in leg)    No chest discomfort, dizziness, shortness of breath, tingling in arm, slurred speech, swelling, or redness in leg.     Treatment  Target INR: 2.0-3.0 INR: 2.9  Date: 10/01/2010 INR reflects regimen in: 2.9  New  Tablet strength: : 5mg  Regimen Out:     Sunday: 2 & 1/2 Tablet     Monday: 2 Tablet     Tuesday: 2 & 1/2 Tablet     Wednesday: 2 Tablet  Thursday: 2 & 1/2 Tablet      Friday: 2 Tablet     Saturday: 2 & 1/2 Tablet Total Weekly: 80.0mg/week mg  Next INR Due: 10/08/2010 Adjusted by: Mikhala Kenan B. Khadijatou Borak III PharmD CACP   Return to anticoagulation clinic:  10/08/2010 Time of next visit: 1415    Allergies: No Known Drug Allergies Prescriptions: COUMADIN 5 MG TABS (WARFARIN SODIUM) take 3 tablets (15mg) at 6.00pm until your appointment with Dr. Dijon Cosens on 10/01/10 for dose adjustment  #90 x 1   Entered by:   Jay Amma Crear PharmD   Authorized by:   Elizabeth Butcher MD   Signed by:   Jay Simon Aaberg PharmD on 10/01/2010   Method used:   Electronically to        [id number]* (retail)       [address]       [place], [place]  [address]       Ph: [phone]       Fax: [phone]   RxID:   [id number] COUMADIN 5 MG TABS (WARFARIN SODIUM) take 3 tablets (15mg) at 6.00pm until your appointment with Dr. Marcell Chavarin on 10/01/10 for dose adjustment  #90 x 1   Entered by:   Jay Elveria Lauderbaugh PharmD   Authorized by:   Elizabeth Butcher MD   Signed by:   Jay Angelisa Winthrop PharmD on 10/01/2010   Method used:   Electronically to        [id number]* (retail)       [address]       [place], [place]  [address]       Ph: [phone]       Fax:  [phone]

## 2011-01-17 NOTE — Miscellaneous (Signed)
Summary: clinical update/ryan white NCADAP app completed  Clinical Lists Changes  Observations: Added new observation of CASE MGM: Dorian Pod [facility] (04/30/2010 12:51) Added new observation of RWTITLE: B (04/30/2010 12:51) Added new observation of AIDSDAP: Pending (04/30/2010 12:51) Added new observation of INCOMESOURCE: None (04/30/2010 12:51) Added new observation of HOUSEINCOME: 0  (04/30/2010 12:51) Added new observation of #CHILD<18 IN: No  (04/30/2010 12:51) Added new observation of FAMILYSIZE: 1  (04/30/2010 12:51) Added new observation of HOUSING: Unstable  (04/30/2010 12:51) Added new observation of FINASSESSDT: 04/30/2010  (04/30/2010 12:51) Added new observation of REC_MAIL: No  (04/30/2010 12:51) Added new observation of RW VITAL STA: Active  (04/30/2010 12:51) Added new observation of PATNTCOUNTY: [place]  (04/30/2010 12:51)

## 2011-01-17 NOTE — Progress Notes (Signed)
Summary: meds picked up  Phone Note Call from Patient   Caller: Patient Summary of Call: Pts. Mom, Hassan Rowan picked up pts. 042 meds, because pt. is currently in the hospital Initial call taken by: Myrtis Hopping CMA [name]),  September 28, 2010 3:29 PM

## 2011-01-17 NOTE — Miscellaneous (Signed)
Summary: Orders Update  Clinical Lists Changes  Orders: Added new Referral order of Orthopedic Referral (Ortho) - Signed

## 2011-01-17 NOTE — Progress Notes (Signed)
Summary: RX for Triamcinolone and Clotrimazole  ---- Converted from flag ---- ---- 08/29/2010 11:12 AM, Andree Moro wrote: Dr. Tomma Lightning, I spoke with you earlier about this patient and the anti-fungal cream he was prescribed.  It is Lotrisone, so would it be possible to have the prescription separated into the two different components? Thank you! Dorian Pod ------------------------------  Phone Note Other Incoming   Summary of Call: please change his lotrisone to clotrimazole cream and triamcinolone cream so he can get them on the $4 Rx plan and notify pt Initial call taken by: Aldona Bar MD,  August 29, 2010 2:56 PM    New/Updated Medications: CLOTRIMAZOLE 1 % CREA (CLOTRIMAZOLE) apply two times a day TRIAMCINOLONE ACETONIDE 0.1 % CREA (TRIAMCINOLONE ACETONIDE) apply two times a day Prescriptions: TRIAMCINOLONE ACETONIDE 0.1 % CREA (TRIAMCINOLONE ACETONIDE) apply two times a day  #60 gm x 3   Entered by:   Myrtis Hopping CMA ( [place])   Authorized by:   Aldona Bar MD   Signed by:   Myrtis Hopping CMA ( [place]) on 08/30/2010   Method used:   Electronically to        [facility] [phone]* (retail)       [place], [place]  [address]       Ph: [phone]       Fax: [phone]   RxIDPO:[id number] CLOTRIMAZOLE 1 % CREA (CLOTRIMAZOLE) apply two times a day  #60 gm x 3   Entered by:   Myrtis Hopping CMA ( [place])   Authorized by:   Aldona Bar MD   Signed by:   Myrtis Hopping CMA ( [place]) on 08/30/2010   Method used:   Electronically to        [facility] [phone]* (retail)       [address]       [place], [place]  [address]       Ph: [phone]       Fax: [phone]   RxIDTA:[id number]

## 2011-01-17 NOTE — Progress Notes (Signed)
Summary: c/o side effects from meds  Phone Note Call from Patient   Caller: Patient Summary of Call: Pt. called just started HIV meds two days ago c/o nausea, chills, diarrhea and rash on hands and groin area.  Gave pt. appt. for Monday 08/13/10.  Also said he has been hospitalized at behavioral health and put on several meds, told him to bring all of them with him. Initial call taken by: Myrtis Hopping CMA [name]),  August 10, 2010 4:45 PM

## 2011-01-17 NOTE — Letter (Signed)
Summary: Soc. Work  [facility]  [address]   [place], [place] [address]   Phone: [phone]  Fax: [phone]    10/09/2010     Brian [name] [address] [place], [place]  [address]  Dear [name],  Your doctor asked that I contact you to provide counseling to help you quit smoking.  Please call me at your earliest convenience so I can speak with you about various tips for quitting smoking and help you consider a plan for quitting.    My phone number is [phone].  I look forward to meeting you.   Sincerely,    Katy Fitch [facility] Smoking Cessation Counselor
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
## 2011-01-17 NOTE — Progress Notes (Signed)
Summary: Questions about RW card  Phone Note Other Incoming   Caller: patient Summary of Call: Pt here today for labs and has questions about his RW card . He is waiting for the card to [facility]. It looks like he has been approved and Verdis Frederickson may be waiting on the card from [facility].  Pt does not have a phone so he will call Verdis Frederickson on Thursday morning for status of card.  Orland Mustard RN  June 20, 2010 9:27 AM   Follow-up for Phone Call        card has been taken care of.  Deb Hill will interoffice me a hard copy of his card once she has inputed it into the EMR system. Follow-up by: Canary Brim  BS,CPht II,MPH,  June 27, 2010 4:55 PM

## 2011-01-17 NOTE — Assessment & Plan Note (Signed)
Summary: COU/CH  Anticoagulant Therapy Managed by: Dorene Grebe. Ceasar Lund  PharmD [place] Attending: Verneda Skill MD Indication 1: Pulmonary  embolus Indication 2: Encounter for therapeutic drug monitoring  V58.83 Start date: 09/22/2010 Duration: 6 months  Patient Assessment Reviewed by: [name] PharmD  December 17, 2010 Medication review: verified warfarin dosage & schedule,verified previous prescription medications, verified doses & any changes, verified new medications, reviewed OTC medications, reviewed OTC health products-vitamins supplements etc Complications: none Dietary changes: none   Health status changes: none   Lifestyle changes: none   Recent/future hospitalizations: none   Recent/future procedures: none   Recent/future dental: none Patient Assessment Part 2:  Have you MISSED ANY DOSES or CHANGED TABLETS?  No missed Warfarin doses or changed tablets.  Have you had any BRUISING or BLEEDING ( nose or gum bleeds,blood in urine or stool)?  No reported bruising or bleeding in nose, gums, urine, stool.  Have you STARTED or STOPPED any MEDICATIONS, including OTC meds,herbals or supplements?  No other medications or herbal supplements were started or stopped.  Have you CHANGED your DIET, especially green vegetables,or ALCOHOL intake?  No changes in diet or alcohol intake.  Have you had any ILLNESSES or HOSPITALIZATIONS?  No reported illnesses or hospitalizations  Have you had any signs of CLOTTING?(chest discomfort,dizziness,shortness of breath,arms tingling,slurred speech,swelling or redness in leg)    No chest discomfort, dizziness, shortness of breath, tingling in arm, slurred speech, swelling, or redness in leg.     Treatment  Target INR: 2.0-3.0 INR: 3.4  Date: 12/17/2010 Regimen In:  85.0mg /week INR reflects regimen in: 3.4  New  Tablet strength: : 5mg  Regimen Out:     Sunday: 2 & 1/2 Tablet     Monday: 2 Tablet     Tuesday: 2 & 1/2 Tablet  Wednesday: 2 Tablet     Thursday: 2 & 1/2 Tablet      Friday: 2 Tablet     Saturday: 2 & 1/2 Tablet Total Weekly: 80.0mg/week mg  Next INR Due: 01/07/2011 Adjusted by: Shylah Dossantos B. Harlym Gehling III PharmD CACP   Return to anticoagulation clinic:  01/07/2011 Time of next visit: 1545    Allergies: No Known Drug Allergies Prescriptions: COUMADIN 5 MG TABS (WARFARIN SODIUM) take 3 tablets (15mg) at 6.00pm until your appointment with Dr. Gretna Bergin on 10/01/10 for dose adjustment  #90 x 2   Entered by:   Jay Demarious Kapur PharmD   Authorized by:   Stewart Rogers MD   Signed by:   Jay Verdis Koval PharmD on 12/17/2010   Method used:   Electronically to        [id number]* (retail)       [address], [place]  [address]       Ph: [phone]       Fax: [phone]   RxID:   [id number]

## 2011-01-17 NOTE — Progress Notes (Signed)
Summary: [facility]   Phone Note Other Incoming   Caller: [facility]  Summary of Call: Pt has been a no show for several appointments.   He was a no show on yesterday and was called the day prior and said he was coming. They may  not be able to reschedule him again.  Myrtis Hopping CMA [name])  October 17, 2010 10:30 AM
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
## 2011-01-17 NOTE — Miscellaneous (Signed)
Summary: Orders Update/per Shirl Harris @[facility]  Clinical Lists Changes  Orders: Added new Test order of T-Renal Function Panel [phone]) - Signed     Process Orders Check Orders Results:     [facility]: [id number] not required for this insurance Order queued for requisitioning for [facility]: January 11, 2011 11:05 AM Tests Sent for requisitioning (January 11, 2011 11:35 AM):     01/11/2011: [facility] -- T-Renal Function Panel [phone] (signed)

## 2011-01-17 NOTE — Progress Notes (Signed)
Summary: [facility]/pt assist meds arrived for Aug  Phone Note Refill Request      Prescriptions: SUSTIVA 600 MG TABS (EFAVIRENZ) Take 1 tablet by mouth at bedtime  #30 x 0   Entered by:   Canary Brim  BS,CPht II,MPH   Authorized by:   Aldona Bar MD   Signed by:   Canary Brim  BS,CPht II,MPH on 07/16/2010   Method used:   Samples Given   RxID:   [id number] COMBIVIR 150-300 MG TABS (LAMIVUDINE-ZIDOVUDINE) Take 1 tablet by mouth two times a day  #60 x 0   Entered by:   Canary Brim  BS,CPht II,MPH   Authorized by:   Aldona Bar MD   Signed by:   Canary Brim  BS,CPht II,MPH on 07/16/2010   Method used:   Samples Given   RxID:   [id number]  Patient Assist Medication Verification: Medication name: combivir 150/300mg  RX # [id number] Tech approval:MLD  Patient Assist Medication Verification: Medication name:Sustiva 600mg  RX # [id number] Tech approval:MLD  Tried to contact patient.  The number we have on file is no longer valid at the time the call was placed. Canary Brim  BS,CPht II,MPH  July 16, 2010 10:21 AM

## 2011-01-17 NOTE — Progress Notes (Signed)
Summary: refill/gg  Phone Note Refill Request  on October 26, 2010 2:44 PM  Refills Requested: Medication #1:  VICODIN 5-500 MG TABS Take 1 tablet by mouth daily as needed for pain   Last Refilled: 10/08/2010 # pt has to take 3 a day.  He was told by kidney doctor not to take any OTC pills.    Method Requested: Telephone to Pharmacy Initial call taken by: Gevena Cotton RN,  October 26, 2010 2:44 PM  Follow-up for Phone Call        Was seen by Dr Wendee Beavers in Oct but need for chornic narcn not documented. Will have to deny rquest and ask pt to make appt if he feels he needs eval for ongoing narcs, Follow-up by: Larey Dresser MD,  October 29, 2010 3:27 PM

## 2011-01-17 NOTE — Progress Notes (Signed)
Summary: requesting labs    Caller: Patient Summary of Call: Pt. called and left message saying he was not due back for labs until 2/12, but that Nephrologist wanted lab work before next visit.  Called pt. to find out why Kidney Specialist could not order labs that are needed.  Left message to return my call Initial call taken by: Myrtis Hopping CMA [name]),  November 13, 2010 9:02 AM  Follow-up for Phone Call       Follow-up by: Aldona Bar MD,  November 13, 2010 4:01 PM
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
## 2011-01-17 NOTE — Medication Information (Signed)
Summary: Brian [name]   Imported By: Garlan Fillers 10/17/2010 14:32:50  _____________________________________________________________________  External Attachment:    Type:   Image     Comment:   External Document

## 2011-01-17 NOTE — Progress Notes (Signed)
Summary: requesting pain medication RX Vicodin  Phone Note Call from Patient   Caller: Patient Summary of Call: Gave pt. MRI results and will refer to [facility] ortho. clinic.  He was requesting something for pain called to [facility] at [facility] he has a discount card there. Initial call taken by: Myrtis Hopping CMA [name]),  July 23, 2010 12:31 PM  Follow-up for Phone Call        what has he tried Follow-up by: Aldona Bar MD,  July 23, 2010 2:11 PM  Additional Follow-up for Phone Call Additional follow up Details #1::        He said in the past he has been given Percocet at the hospital.  He was taken Vicodin also. Additional Follow-up by: Myrtis Hopping CMA [name]),  July 23, 2010 2:17 PM    Additional Follow-up for Phone Call Additional follow up Details #2::    vicodin #30 no refills Follow-up by: Aldona Bar MD,  July 23, 2010 2:46 PM  New/Updated Medications: VICODIN 5-500 MG TABS (HYDROCODONE-ACETAMINOPHEN) Take 1 tablet by mouth every 8 hours as needed Prescriptions: VICODIN 5-500 MG TABS (HYDROCODONE-ACETAMINOPHEN) Take 1 tablet by mouth every 8 hours as needed  #30 x 0   Entered by:   Myrtis Hopping CMA ( [place])   Authorized by:   Aldona Bar MD   Signed by:   Myrtis Hopping CMA ( [place]) on 07/23/2010   Method used:   Telephoned to ...       [facility] [address] [phone]* (retail)       [address]       [place], [place]  [address]       Ph: [phone] or [phone]       Fax: [phone]   RxID:   [id number]
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
## 2011-01-17 NOTE — Miscellaneous (Signed)
  Clinical Lists Changes  Observations: Added new observation of FAMILY HX: Mom has CAD. Grandfather had multiple MI's and also CABG Father has OSA (09/22/2010 3:55)      Complete Medication List: 1)  Combivir 150-300 Mg Tabs (Lamivudine-zidovudine) .... Take 1 tablet by mouth two times a day 2)  Sustiva 600 Mg Tabs (Efavirenz) .... Take 1 tablet by mouth at bedtime 3)  Vicodin 5-500 Mg Tabs (Hydrocodone-acetaminophen) .... Take 1 tablet by mouth every 8 hours as needed 4)  Campral 333 Mg Tbec (Acamprosate calcium) .... Take 1 tablet by mouth three times a day 5)  Celexa 20 Mg Tabs (Citalopram hydrobromide) .... Take 1 tablet by mouth once a day 6)  Zestril 10 Mg Tabs (Lisinopril) .... Take 1 tablet by mouth once a day 7)  Baclofen 20 Mg Tabs (Baclofen) .... Take 1 tablet by mouth once a day at bedtime 8)  Trazodone Hcl 100 Mg Tabs (Trazodone hcl) .... Take one tablet one to three times per day 9)  Claritin 10 Mg Tabs (Loratadine) .... Take 1 tablet by mouth once a day 10)  Clotrimazole 1 % Crea (Clotrimazole) .... Apply two times a day 11)  Triamcinolone Acetonide 0.1 % Crea (Triamcinolone acetonide) .... Apply two times a day   Family History: Mom has CAD. Grandfather had multiple MI's and also CABG Father has OSA

## 2011-01-17 NOTE — Miscellaneous (Signed)
Summary: HIPPA RESTRICTION  HIPPA RESTRICTION   Imported By: Garlan Fillers 03/02/2010 16:38:20  _____________________________________________________________________  External Attachment:    Type:   Image     Comment:   External Document

## 2011-01-17 NOTE — Progress Notes (Signed)
Summary: med refill/gp  Phone Note Refill Request Message from:  Patient on October 08, 2010 3:25 PM  Refills Requested: Medication #1:  PERCOCET 10-325 MG TABS take 1 tab every 4 to 6 hours as needed pain Pt. is here in the clinic  now; he saw [name].   Method Requested: Pick up at Office Initial call taken by: Morrison Old RN,  October 08, 2010 3:25 PM    New/Updated Medications: VICODIN 5-500 MG TABS (HYDROCODONE-ACETAMINOPHEN) Take 1 tablet by mouth daily as needed for pain Will change to vicodin. Given a written prescription.   Appended Document: med refill/gp Vicodin Rx was given to pt. yesterday.

## 2011-01-17 NOTE — Assessment & Plan Note (Addendum)
Summary: COU/CH  Anticoagulant Therapy Managed by: Dorene Grebe. Elie Confer III  PharmD [place] Attending: Lazarus Salines, Beth Indication 1: Pulmonary  embolus Indication 2: Encounter for therapeutic drug monitoring  V58.83 Start date: 09/22/2010 Duration: 6 months  Patient Assessment Reviewed by: [name] PharmD  January 07, 2011 Medication review: verified warfarin dosage & schedule,verified previous prescription medications, verified doses & any changes, verified new medications, reviewed OTC medications, reviewed OTC health products-vitamins supplements etc Complications: none Dietary changes: none   Health status changes: none   Lifestyle changes: none   Recent/future hospitalizations: none   Recent/future procedures: none   Recent/future dental: none Patient Assessment Part 2:  Have you MISSED ANY DOSES or CHANGED TABLETS?  No missed Warfarin doses or changed tablets.  Have you had any BRUISING or BLEEDING ( nose or gum bleeds,blood in urine or stool)?  No reported bruising or bleeding in nose, gums, urine, stool.  Have you STARTED or STOPPED any MEDICATIONS, including OTC meds,herbals or supplements?  No other medications or herbal supplements were started or stopped.  Have you CHANGED your DIET, especially green vegetables,or ALCOHOL intake?  No changes in diet or alcohol intake.  Have you had any ILLNESSES or HOSPITALIZATIONS?  No reported illnesses or hospitalizations  Have you had any signs of CLOTTING?(chest discomfort,dizziness,shortness of breath,arms tingling,slurred speech,swelling or redness in leg)    No chest discomfort, dizziness, shortness of breath, tingling in arm, slurred speech, swelling, or redness in leg.     Treatment  Target INR: 2.0-3.0 INR: 3.4  Date: 01/07/2011 Regimen In:  80.0mg /week INR reflects regimen in: 3.4  New  Tablet strength: : 5mg  Regimen Out:     Sunday: 2 Tablet     Monday: 2 & 1/2 Tablet     Tuesday: 2 Tablet     Wednesday: 2  Tablet     Thursday: 2 & 1/2 Tablet      Friday: 2 Tablet     Saturday: 2 Tablet Total Weekly: 75.0mg /week mg  Next INR Due: 01/28/2011 Adjusted by: Dorene Grebe. Elie Confer III PharmD CACP   Return to anticoagulation clinic:  01/28/2011 Time of next visit: 1500    Allergies: No Known Drug Allergies

## 2011-01-17 NOTE — Consult Note (Signed)
Summary: New Referral : [facility] Dept.  New Referral : [facility]   Imported By: Bonner Puna 04/03/2010 15:48:10  _____________________________________________________________________  External Attachment:    Type:   Image     Comment:   External Document

## 2011-01-17 NOTE — Progress Notes (Signed)
Summary: pt. notified ADAP meds arrived  Phone Note Outgoing Call   Call placed by: Kennyth Lose Summary of Call: Left message ADAP meds, Sustiva and Combiver are ready for pick up Initial call taken by: Myrtis Hopping CMA [name]),  October 08, 2010 2:46 PM

## 2011-01-17 NOTE — Assessment & Plan Note (Signed)
Summary: 2wk f/u [mkj]   CC:  follow-up visit, lab results, c/o sinus congestion starting to clear up, and pt. has stopped all mental health meds..  History of Present Illness: Pt feeling fairly well. He did not get into the rehab program yet.  He is currently living with his Mother. No missed doses. Pt has some sinus congestion but it has improved.  Preventive Screening-Counseling & Management  Alcohol-Tobacco     Alcohol drinks/day: weekends     Alcohol type: vodka     Smoking Status: current     Smoking Cessation Counseling: yes     Packs/Day: 0.5  Caffeine-Diet-Exercise     Caffeine use/day: coffee one a day     Does Patient Exercise: no  Hep-HIV-STD-Contraception     HIV Risk: risk noted  Safety-Violence-Falls     Seat Belt Use: yes     Helmet Use: yes      Sexual History:  n/a.        Drug Use:  never.    Comments: pt. given condoms   Updated Prior Medication List: COMBIVIR 150-300 MG TABS (LAMIVUDINE-ZIDOVUDINE) Take 1 tablet by mouth two times a day SUSTIVA 600 MG TABS (EFAVIRENZ) Take 1 tablet by mouth at bedtime CLOTRIMAZOLE 1 % CREA (CLOTRIMAZOLE) apply two times a day TRIAMCINOLONE ACETONIDE 0.1 % CREA (TRIAMCINOLONE ACETONIDE) apply two times a day VICODIN 5-500 MG TABS (HYDROCODONE-ACETAMINOPHEN) Take 1 tablet by mouth daily as needed for pain COUMADIN 5 MG TABS (WARFARIN SODIUM) take 3 tablets (15mg ) at 6.00pm until your appointment with Dr. Elie Confer on 10/01/10 for dose adjustment AMLODIPINE BESYLATE 10 MG TABS (AMLODIPINE BESYLATE) take one tab by mouth daily  Current Allergies (reviewed today): No known allergies  Past History:  Past Medical History: Last updated: 10/03/2010 Ascending thoracic fusiform aortic aneurysm      Incidental finding CT 10/11     4.2 x 4.2 cm     Needs CT scan 4/11  Hypercoag state     PE 10/11 - receiving 6 months coumadin     Hypercoag panel negative     H/O PE about 2004  Alcoholism  HIV - Dr Casilda Carls    Review  of Systems  The patient denies anorexia, fever, and weight loss.    Vital Signs:  Patient profile:   35 year old male Height:      75 inches (190.50 cm) Weight:      187.8 pounds (85.36 kg) BMI:     23.56 Temp:     97.8 degrees F (36.56 degrees C) oral Pulse rate:   89 / minute BP sitting:   135 / 88  (left arm)  Vitals Entered By: Myrtis Hopping CMA [name]) (October 16, 2010 2:56 PM) CC: follow-up visit, lab results, c/o sinus congestion starting to clear up, pt. has stopped all mental health meds. Is Patient Diabetic? No Pain Assessment Patient in pain? yes     Location: lower back, left knee Intensity: 4 Type: aching Onset of pain  Constant Nutritional Status BMI of 19 -24 = normal Nutritional Status Detail appetite "better"  Have you ever been in a relationship where you felt threatened, hurt or afraid?No   Does patient need assistance? Functional Status Self care Ambulation Normal Comments no missed doses of HAART meds per pt.   Physical Exam  General:  alert, well-developed, well-nourished, and well-hydrated.   Head:  normocephalic and atraumatic.   Mouth:  pharynx pink and moist.   Lungs:  normal breath sounds.  Impression & Recommendations:  Problem # 1:  HIV INFECTION (ICD-042) Pt.s most recent CD4ct was 580 and VL 173 .  Pt instructed to continue the current antiretroviral regimen.  Pt encouraged to take medication regularly and not miss doeses.  Pt will f/u in 3 months for repeat blood work and will see me 2 weeks later.  Third Hep B vaccine given.  Diagnostics Reviewed:  HIV: REACTIVE (02/28/2010)   HIV-Western blot: Positive (02/28/2010)   CD4: 580 (10/04/2010)   WBC: 3.2 (10/03/2010)   Hgb: 11.3 (10/03/2010)   HCT: 33.9 (10/03/2010)   Platelets: 235 (10/03/2010) HIV genotype: * (02/28/2010)   HIV-1 RNA: 173 (10/02/2010)   HBSAg: NEG (02/28/2010)  Orders: Est. Patient Level III (99213)Future Orders: T-CD4SP ([facility]) (CD4SP) ...  01/14/2011 T-HIV Viral Load [phone]) ... 01/14/2011 T-Comprehensive Metabolic Panel ([id number]) ... 01/14/2011 T-CBC w/Diff [id number]) ... 01/14/2011  Other Orders: Hepatitis B Vaccine >85yrs [id number]) Admin 1st Vaccine [id number])  Patient Instructions: 1)  Please schedule a follow-up appointment in 3 months, 2 weeks after labs.      Immunizations Administered:  Hepatitis B Vaccine # 3:    Vaccine Type: HepB Adult    Site: left deltoid    Mfr: Merck    Dose: 0.5 ml    Route: IM    Given by: Myrtis Hopping CMA ( [place])    Exp. Date: 12/01/2012    Lot #: [id number]    VIS given: 07/02/06 version given October 16, 2010.

## 2011-01-21 ENCOUNTER — Other Ambulatory Visit (INDEPENDENT_AMBULATORY_CARE_PROVIDER_SITE_OTHER): Payer: Self-pay

## 2011-01-21 ENCOUNTER — Encounter: Payer: Self-pay | Admitting: Adult Health

## 2011-01-21 ENCOUNTER — Other Ambulatory Visit: Payer: Self-pay | Admitting: Adult Health

## 2011-01-21 DIAGNOSIS — B20 Human immunodeficiency virus [HIV] disease: Secondary | ICD-10-CM

## 2011-01-21 LAB — CONVERTED CEMR LAB
HIV 1 RNA Quant: <20 copies/mL (ref <20)
HIV-1 RNA Quant, Log: <1.3 (ref <1.30)

## 2011-01-22 ENCOUNTER — Ambulatory Visit (HOSPITAL_COMMUNITY)
Admission: RE | Admit: 2011-01-22 | Discharge: 2011-01-22 | Disposition: A | Discharge status: Home / Self Care | Payer: Self-pay | Source: Ambulatory Visit | Attending: Internal Medicine | Admitting: Internal Medicine

## 2011-01-22 ENCOUNTER — Encounter (HOSPITAL_COMMUNITY): Payer: Self-pay

## 2011-01-22 DIAGNOSIS — J984 Other disorders of lung: Secondary | ICD-10-CM | POA: Insufficient documentation

## 2011-01-22 DIAGNOSIS — I77819 Aortic ectasia, unspecified site: Secondary | ICD-10-CM | POA: Insufficient documentation

## 2011-01-22 DIAGNOSIS — J438 Other emphysema: Secondary | ICD-10-CM | POA: Insufficient documentation

## 2011-01-22 DIAGNOSIS — Z86711 Personal history of pulmonary embolism: Secondary | ICD-10-CM | POA: Insufficient documentation

## 2011-01-22 DIAGNOSIS — R911 Solitary pulmonary nodule: Secondary | ICD-10-CM

## 2011-01-22 LAB — T-HELPER CELL (CD4) - (RCID CLINIC ONLY): CD4 T Cell Abs: 620 uL (ref 400–2700)

## 2011-01-23 NOTE — Assessment & Plan Note (Signed)
Summary: COU/CH  Anticoagulant Therapy Managed by: Dorene Grebe. Ceasar Lund  PharmD [place] Attending: Verneda Skill MD Indication 1: Pulmonary  embolus Indication 2: Encounter for therapeutic drug monitoring  V58.83 Start date: 09/22/2010 Duration: 6 months  Patient Assessment Reviewed by: [name] PharmD  January 15, 2011 Medication review: verified warfarin dosage & schedule,verified previous prescription medications, verified doses & any changes, verified new medications, reviewed OTC medications, reviewed OTC health products-vitamins supplements etc Complications: none Dietary changes: none   Health status changes: none   Lifestyle changes: none   Recent/future hospitalizations: none   Recent/future procedures: none   Recent/future dental: none Patient Assessment Part 2:  Have you MISSED ANY DOSES or CHANGED TABLETS?  No missed Warfarin doses or changed tablets.  Have you had any BRUISING or BLEEDING ( nose or gum bleeds,blood in urine or stool)?  No reported bruising or bleeding in nose, gums, urine, stool.  Have you STARTED or STOPPED any MEDICATIONS, including OTC meds,herbals or supplements?  No other medications or herbal supplements were started or stopped.  Have you CHANGED your DIET, especially green vegetables,or ALCOHOL intake?  No changes in diet or alcohol intake.  Have you had any ILLNESSES or HOSPITALIZATIONS?  No reported illnesses or hospitalizations  Have you had any signs of CLOTTING?(chest discomfort,dizziness,shortness of breath,arms tingling,slurred speech,swelling or redness in leg)    No chest discomfort, dizziness, shortness of breath, tingling in arm, slurred speech, swelling, or redness in leg.     Treatment  Target INR: 2.0-3.0 INR: 3.9  Date: 10/08/2010 Regimen In:  80.0mg /week INR reflects regimen in: 3.9  New  Tablet strength: : 5mg  Regimen Out:     Sunday: 2 Tablet     Monday: 2 Tablet     Tuesday: 2 Tablet     Wednesday: 2 & 1/2  Tablet     Thursday: 2 Tablet      Friday: 2 Tablet     Saturday: 2 Tablet Total Weekly: 72.5mg /week mg  Next INR Due: 01/28/2011 Adjusted by: Dorene Grebe. Elie Confer III PharmD CACP   Return to anticoagulation clinic:  01/28/2011 Time of next visit: 1000    Allergies: No Known Drug Allergies

## 2011-01-23 NOTE — Assessment & Plan Note (Signed)
Summary: COU/CH  Anticoagulant Therapy Managed by: Dorene Grebe. Ceasar Lund  PharmD [place] Attending: Verneda Skill MD Indication 1: Pulmonary  embolus Indication 2: Encounter for therapeutic drug monitoring  V58.83 Start date: 09/22/2010 Duration: 6 months  Patient Assessment Reviewed by: [name] PharmD  January 15, 2011 Medication review: verified warfarin dosage & schedule,verified previous prescription medications, verified doses & any changes, verified new medications, reviewed OTC medications, reviewed OTC health products-vitamins supplements etc Complications: none Dietary changes: none   Health status changes: none   Lifestyle changes: none   Recent/future hospitalizations: none   Recent/future procedures: none   Recent/future dental: none Patient Assessment Part 2:  Have you MISSED ANY DOSES or CHANGED TABLETS?  No missed Warfarin doses or changed tablets.  Have you had any BRUISING or BLEEDING ( nose or gum bleeds,blood in urine or stool)?  No reported bruising or bleeding in nose, gums, urine, stool.  Have you STARTED or STOPPED any MEDICATIONS, including OTC meds,herbals or supplements?  No other medications or herbal supplements were started or stopped.  Have you CHANGED your DIET, especially green vegetables,or ALCOHOL intake?  No changes in diet or alcohol intake.  Have you had any ILLNESSES or HOSPITALIZATIONS?  No reported illnesses or hospitalizations  Have you had any signs of CLOTTING?(chest discomfort,dizziness,shortness of breath,arms tingling,slurred speech,swelling or redness in leg)    No chest discomfort, dizziness, shortness of breath, tingling in arm, slurred speech, swelling, or redness in leg.     Treatment  Target INR: 2.0-3.0 INR: 2.1  Date: 11/12/2010 Regimen In:  80.0mg /week INR reflects regimen in: 2.1  New  Tablet strength: : 5mg  Regimen Out:     Sunday: 2 Tablet     Monday: 2 & 1/2 Tablet     Tuesday: 2 Tablet     Wednesday: 2  & 1/2 Tablet     Thursday: 2 Tablet      Friday: 2 & 1/2 Tablet     Saturday: 2 Tablet Total Weekly: 77.5mg /week mg  Next INR Due: 12/03/2010 Adjusted by: Dorene Grebe. Elie Confer III PharmD CACP   Return to anticoagulation clinic:  12/03/2010 Time of next visit: 1000    Allergies: No Known Drug Allergies

## 2011-01-23 NOTE — Progress Notes (Signed)
Summary: ADAP submitted and approved  Phone Note Outgoing Call   Action Taken: Phone Call Completed Reason for Call: Get patient information Summary of Call: ADAP application completed and submitted to [facility] by Andree Moro on 12/26/2010.  ADAP appoved 01/08/2011.

## 2011-01-28 ENCOUNTER — Ambulatory Visit (INDEPENDENT_AMBULATORY_CARE_PROVIDER_SITE_OTHER): Payer: Self-pay | Admitting: Pharmacist

## 2011-01-28 DIAGNOSIS — Z7901 Long term (current) use of anticoagulants: Secondary | ICD-10-CM

## 2011-01-28 DIAGNOSIS — I2699 Other pulmonary embolism without acute cor pulmonale: Secondary | ICD-10-CM

## 2011-01-28 LAB — PROTIME-INR: INR: 2.3 — AB (ref <=1.1)

## 2011-01-28 NOTE — Patient Instructions (Signed)
Patient instructed to take medications as defined in the Anti-coagulation Track section of this encounter.  Patient instructed to take today's dose.  Patient verbalized understanding of these instructions.    

## 2011-01-28 NOTE — Progress Notes (Signed)
Anti-Coagulation Progress Note  Brian [name] is a 35 y.o. male who is currently on an anti-coagulation regimen.    RECENT RESULTS: Recent results are below, the most recent result is correlated with a dose of 75 mg. per week: Lab Results  Component Value Date   INR 2.3* 01/28/2011   INR 3.4 01/07/2011   INR 3.4 12/17/2010    ANTI-COAG DOSE:   Latest dosing instructions   Total Sun Mon Tue Wed Thu Fri Sat   72.5 10 mg 12.5 mg 10 mg 12.5 mg 10 mg 7.5 mg 10 mg    (5 mg2) (5 mg2.5) (5 mg2) (5 mg2.5) (5 mg2) (5 mg1.5) (5 mg2)         ANTICOAG SUMMARY: Anticoagulation Episode Summary              Current INR goal 2.0-3.0 Next INR check 03/04/2011   INR from last check 2.3 (01/28/2011)     Weekly max dose (mg)  Target end date 03/23/2011   Indications PULMONARY EMBOLISM, Long term current use of anticoagulant   INR check location [facility] Preferred lab    Send INR reminders to [facility]   Comments        Provider Role Specialty Phone number   Larey Dresser  Internal Medicine [phone]        ANTICOAG TODAY: Anticoagulation Summary as of 01/28/2011              INR goal 2.0-3.0     Selected INR 2.3 (01/28/2011) Next INR check 03/04/2011   Weekly max dose (mg)  Target end date 03/23/2011   Indications PULMONARY EMBOLISM, Long term current use of anticoagulant    Anticoagulation Episode Summary              INR check location [facility] Preferred lab    Send INR reminders to [facility]   Comments        Provider Role Specialty Phone number   Larey Dresser  Internal Medicine [phone]        PATIENT INSTRUCTIONS: Patient Instructions  Patient instructed to take medications as defined in the Anti-coagulation Track section of this encounter.  Patient instructed to take today's dose.  Patient verbalized understanding of these instructions.        FOLLOW-UP Return in 5 weeks (on 03/04/2011).  Jorene Guest, III Pharm.D., CACP

## 2011-01-30 ENCOUNTER — Encounter: Payer: Self-pay | Admitting: Infectious Diseases

## 2011-01-31 LAB — CONVERTED CEMR LAB
ALT: 13 units/L (ref 0–53)
Albumin: 4 g/dL (ref 3.5–5.2)
Alkaline Phosphatase: 54 units/L (ref 39–117)
Basophils Absolute: 0 10*3/uL (ref 0.0–0.1)
Basophils Relative: 0 % (ref 0–1)
CO2: 22 meq/L (ref 19–32)
Eosinophils Absolute: 0.1 10*3/uL (ref 0.0–0.7)
Glucose, Bld: 108 mg/dL — ABNORMAL HIGH (ref 70–99)
MCHC: 35 g/dL (ref 30.0–36.0)
MCV: 110.8 fL — ABNORMAL HIGH (ref 78.0–100.0)
Monocytes Relative: 9 % (ref 3–12)
Neutro Abs: 2.6 10*3/uL (ref 1.7–7.7)
Neutrophils Relative %: 57 % (ref 43–77)
Platelets: 184 10*3/uL (ref 150–400)
Potassium: 4.8 meq/L (ref 3.5–5.3)
RBC: 2.68 M/uL — ABNORMAL LOW (ref 4.22–5.81)
Sodium: 138 meq/L (ref 135–145)
Total Protein: 7 g/dL (ref 6.0–8.3)

## 2011-02-04 ENCOUNTER — Ambulatory Visit (INDEPENDENT_AMBULATORY_CARE_PROVIDER_SITE_OTHER): Payer: Self-pay | Admitting: Adult Health

## 2011-02-04 ENCOUNTER — Ambulatory Visit: Payer: Self-pay | Admitting: Internal Medicine

## 2011-02-04 DIAGNOSIS — M25569 Pain in unspecified knee: Secondary | ICD-10-CM

## 2011-02-04 DIAGNOSIS — B20 Human immunodeficiency virus [HIV] disease: Secondary | ICD-10-CM

## 2011-02-06 NOTE — Consult Note (Signed)
Summary: [facility]   Imported By: Bonner Puna 01/28/2011 08:59:29  _____________________________________________________________________  External Attachment:    Type:   Image     Comment:   External Document

## 2011-02-07 ENCOUNTER — Ambulatory Visit (INDEPENDENT_AMBULATORY_CARE_PROVIDER_SITE_OTHER): Payer: Self-pay | Admitting: Internal Medicine

## 2011-02-07 ENCOUNTER — Encounter: Payer: Self-pay | Admitting: Internal Medicine

## 2011-02-07 VITALS — BP 129/83 | HR 92 | Temp 97.6°F | Ht 75.0 in | Wt 193.6 lb

## 2011-02-07 DIAGNOSIS — J984 Other disorders of lung: Secondary | ICD-10-CM

## 2011-02-07 DIAGNOSIS — I2699 Other pulmonary embolism without acute cor pulmonale: Secondary | ICD-10-CM

## 2011-02-07 DIAGNOSIS — B20 Human immunodeficiency virus [HIV] disease: Secondary | ICD-10-CM

## 2011-02-07 DIAGNOSIS — I10 Essential (primary) hypertension: Secondary | ICD-10-CM

## 2011-02-07 DIAGNOSIS — M25569 Pain in unspecified knee: Secondary | ICD-10-CM

## 2011-02-07 DIAGNOSIS — I712 Thoracic aortic aneurysm, without rupture: Secondary | ICD-10-CM

## 2011-02-07 DIAGNOSIS — F17200 Nicotine dependence, unspecified, uncomplicated: Secondary | ICD-10-CM

## 2011-02-07 DIAGNOSIS — S83519A Sprain of anterior cruciate ligament of unspecified knee, initial encounter: Secondary | ICD-10-CM | POA: Insufficient documentation

## 2011-02-07 DIAGNOSIS — N184 Chronic kidney disease, stage 4 (severe): Secondary | ICD-10-CM

## 2011-02-07 DIAGNOSIS — S83509A Sprain of unspecified cruciate ligament of unspecified knee, initial encounter: Secondary | ICD-10-CM

## 2011-02-07 MED ORDER — HYDROCODONE-ACETAMINOPHEN 5-500 MG PO TABS: 1.0000 | ORAL_TABLET | Freq: Two times a day (BID) | ORAL | Status: DC | PRN

## 2011-02-07 MED ORDER — EMTRICITABINE-TENOFOVIR DF 200-300 MG PO TABS: ORAL_TABLET | ORAL | Status: DC

## 2011-02-07 MED ORDER — LISINOPRIL 40 MG PO TABS: 40.0000 mg | ORAL_TABLET | Freq: Every day | ORAL | Status: DC

## 2011-02-07 NOTE — Assessment & Plan Note (Addendum)
Continue aggressive BP control. On follow up consider adding betablocker. Repeat CT Angio 6-12 months.

## 2011-02-07 NOTE — Patient Instructions (Signed)
Please return to clinic in 3 months. Take all medications as directed.

## 2011-02-07 NOTE — Progress Notes (Signed)
  Subjective:    Patient ID: Brian [name], male    DOB: [birth year], 35 y.o.   MRN: [id number]  HPI  35 yr old man with  Past Medical History  Diagnosis Date  . Chronic kidney disease, stage IV (severe)   . Hypertension   . Alcohol abuse   . Tobacco abuse   . Pulmonary nodule 10/11    Repeat CT Angio 01/2011>>Resolution of previously seen bibasilar pulmonary nodules.  These may have been related to some degree of pulmonary infarct given the large burden of pulmonary emboli seen previously..  . Thoracic aortic aneurysm 10/11    4cm fusiform aneurysm in ascending aorta found on evaluation during hospitalization (10/11)  Repeat CT Angio 2/12>> Stable dilatation of the ascending aorta when compared to the prior exam..  . Pulmonary embolism 10/11    Unprovoked. Hypercoag panel negative. Will receive 6 months anticoagulation.   . Polycystic kidney disease   . HIV (human immunodeficiency virus infection) 4/11   comes to the clinic complaining of chronic left knee pain of 8 years which in 07/2010 MRI left knee showed acl tear and stress fracture. Patient reports that he popped it out of place while taking a shower. Left knee pops in and out intermitently. Denies any swelling, erythema, fever or chills.   Kidney doctor changed norvasc to lisinopril 40mg  daily. Patient returns in April to follow up.  ID doctor changed combivir to truvada MWF.  Review of Systems  All other systems reviewed and are negative.       Objective:   Physical Exam  Constitutional: He is oriented to person, place, and time. He appears well-developed and well-nourished.  HENT:  Mouth/Throat: Oropharynx is clear and moist.  Eyes: EOM are normal. Pupils are equal, round, and reactive to light.  Neck: Neck supple.  Cardiovascular: Normal rate, regular rhythm and normal heart sounds.   Pulmonary/Chest: Effort normal and breath sounds normal.  Abdominal: Soft. Bowel sounds are normal.  Musculoskeletal:       Left  knee pain ttp medial, and anterior aspect. No swelling, or erythema  Neurological: He is alert and oriented to person, place, and time.  Psychiatric: He has a normal mood and affect.          Assessment & Plan:

## 2011-02-08 ENCOUNTER — Encounter (INDEPENDENT_AMBULATORY_CARE_PROVIDER_SITE_OTHER): Payer: Self-pay | Admitting: *Deleted

## 2011-02-08 NOTE — Assessment & Plan Note (Signed)
Stable. Continue coumadin for total 6 months treatment. Patient instructed to continue going to Dr. Elie Confer for INR check and titration of coumadin.

## 2011-02-08 NOTE — Assessment & Plan Note (Signed)
Discussed smoking cessation and methods that can be used to help quit.

## 2011-02-08 NOTE — Assessment & Plan Note (Signed)
Stable. Continue per ID.

## 2011-02-08 NOTE — Assessment & Plan Note (Signed)
At goal. Continue aggressive BP control. Recheck bmet on follow up.

## 2011-02-08 NOTE — Assessment & Plan Note (Signed)
Referred to Ortho as described above.

## 2011-02-08 NOTE — Assessment & Plan Note (Signed)
Stable. Continue management per Nephrology.

## 2011-02-08 NOTE — Assessment & Plan Note (Signed)
Resolved. CT angio 01/2011 showed no evidence of pulmonary nodule.

## 2011-02-08 NOTE — Assessment & Plan Note (Signed)
2/2 ACL Tear. Patient will be referred to Ortho for possible surgical intervention. Control pain with vicodin. Patient signed pain contract today. No evidence of infection (erythema, swelling)  on exam.

## 2011-02-12 NOTE — Miscellaneous (Signed)
  Clinical Lists Changes  Observations: Added new observation of HOUSING: nonpermanent (12/15/2010 11:36)

## 2011-02-12 NOTE — Miscellaneous (Signed)
Summary: [place] [facility]  [place] [facility]   Imported By: Bonner Puna 02/05/2011 15:11:28  _____________________________________________________________________  External Attachment:    Type:   Image     Comment:   External Document

## 2011-02-22 ENCOUNTER — Ambulatory Visit (HOSPITAL_COMMUNITY)
Admission: RE | Admit: 2011-02-22 | Discharge: 2011-02-22 | Disposition: A | Discharge status: Home / Self Care | Payer: Self-pay | Source: Ambulatory Visit | Attending: Orthopaedic Surgery | Admitting: Orthopaedic Surgery

## 2011-02-22 ENCOUNTER — Inpatient Hospital Stay (HOSPITAL_COMMUNITY)
Admission: EM | Admit: 2011-02-22 | Discharge: 2011-02-27 | DRG: 603 | Disposition: A | Discharge status: Home / Self Care | Payer: Self-pay | Attending: Internal Medicine | Admitting: Internal Medicine

## 2011-02-22 DIAGNOSIS — Z21 Asymptomatic human immunodeficiency virus [HIV] infection status: Secondary | ICD-10-CM | POA: Diagnosis present

## 2011-02-22 DIAGNOSIS — Z5900 Homelessness unspecified: Secondary | ICD-10-CM

## 2011-02-22 DIAGNOSIS — M7989 Other specified soft tissue disorders: Secondary | ICD-10-CM | POA: Insufficient documentation

## 2011-02-22 DIAGNOSIS — F1010 Alcohol abuse, uncomplicated: Secondary | ICD-10-CM | POA: Diagnosis present

## 2011-02-22 DIAGNOSIS — A4902 Methicillin resistant Staphylococcus aureus infection, unspecified site: Secondary | ICD-10-CM | POA: Diagnosis present

## 2011-02-22 DIAGNOSIS — L0390 Cellulitis, unspecified: Secondary | ICD-10-CM

## 2011-02-22 DIAGNOSIS — Z8614 Personal history of Methicillin resistant Staphylococcus aureus infection: Secondary | ICD-10-CM

## 2011-02-22 DIAGNOSIS — N184 Chronic kidney disease, stage 4 (severe): Secondary | ICD-10-CM | POA: Diagnosis present

## 2011-02-22 DIAGNOSIS — L03119 Cellulitis of unspecified part of limb: Principal | ICD-10-CM | POA: Diagnosis present

## 2011-02-22 DIAGNOSIS — Z7901 Long term (current) use of anticoagulants: Secondary | ICD-10-CM | POA: Insufficient documentation

## 2011-02-22 DIAGNOSIS — Z86711 Personal history of pulmonary embolism: Secondary | ICD-10-CM

## 2011-02-22 DIAGNOSIS — R490 Dysphonia: Secondary | ICD-10-CM | POA: Diagnosis present

## 2011-02-22 DIAGNOSIS — D638 Anemia in other chronic diseases classified elsewhere: Secondary | ICD-10-CM | POA: Diagnosis present

## 2011-02-22 DIAGNOSIS — M79609 Pain in unspecified limb: Secondary | ICD-10-CM

## 2011-02-22 DIAGNOSIS — L0291 Cutaneous abscess, unspecified: Secondary | ICD-10-CM

## 2011-02-22 DIAGNOSIS — F17200 Nicotine dependence, unspecified, uncomplicated: Secondary | ICD-10-CM | POA: Diagnosis present

## 2011-02-22 DIAGNOSIS — Q613 Polycystic kidney, unspecified: Secondary | ICD-10-CM

## 2011-02-22 DIAGNOSIS — I129 Hypertensive chronic kidney disease with stage 1 through stage 4 chronic kidney disease, or unspecified chronic kidney disease: Secondary | ICD-10-CM | POA: Diagnosis present

## 2011-02-22 DIAGNOSIS — Z86718 Personal history of other venous thrombosis and embolism: Secondary | ICD-10-CM

## 2011-02-22 DIAGNOSIS — L02419 Cutaneous abscess of limb, unspecified: Principal | ICD-10-CM | POA: Diagnosis present

## 2011-02-22 LAB — DIFFERENTIAL
Basophils Relative: 0 % (ref 0–1)
Eosinophils Absolute: 0.1 10*3/uL (ref 0.0–0.7)
Eosinophils Relative: 2 % (ref 0–5)
Lymphocytes Relative: 21 % (ref 12–46)
Neutro Abs: 4.8 10*3/uL (ref 1.7–7.7)

## 2011-02-22 LAB — CBC
HCT: 25.2 % — ABNORMAL LOW (ref 39.0–52.0)
Hemoglobin: 8.7 g/dL — ABNORMAL LOW (ref 13.0–17.0)
MCHC: 34.5 g/dL (ref 30.0–36.0)
MCV: 111.5 fL — ABNORMAL HIGH (ref 78.0–100.0)

## 2011-02-22 LAB — POCT I-STAT, CHEM 8
Calcium, Ion: 1.06 mmol/L — ABNORMAL LOW (ref 1.12–1.32)
Chloride: 110 mEq/L (ref 96–112)
Glucose, Bld: 99 mg/dL (ref 70–99)
HCT: 27 % — ABNORMAL LOW (ref 39.0–52.0)

## 2011-02-23 ENCOUNTER — Encounter: Payer: Self-pay | Admitting: Internal Medicine

## 2011-02-23 ENCOUNTER — Emergency Department (HOSPITAL_COMMUNITY): Payer: Self-pay

## 2011-02-23 LAB — CBC
MCH: 39.1 pg — ABNORMAL HIGH (ref 26.0–34.0)
MCV: 111.6 fL — ABNORMAL HIGH (ref 78.0–100.0)
Platelets: 127 10*3/uL — ABNORMAL LOW (ref 150–400)
RBC: 2.07 MIL/uL — ABNORMAL LOW (ref 4.22–5.81)

## 2011-02-23 LAB — COMPREHENSIVE METABOLIC PANEL
ALT: 15 U/L (ref 0–53)
Alkaline Phosphatase: 41 U/L (ref 39–117)
CO2: 23 mEq/L (ref 19–32)
Calcium: 8 mg/dL — ABNORMAL LOW (ref 8.4–10.5)
GFR calc non Af Amer: 22 mL/min — ABNORMAL LOW (ref >60)
Glucose, Bld: 115 mg/dL — ABNORMAL HIGH (ref 70–99)
Potassium: 5 mEq/L (ref 3.5–5.1)
Sodium: 133 mEq/L — ABNORMAL LOW (ref 135–145)

## 2011-02-23 NOTE — H&P (Signed)
Hospital Admission Note Date: 02/23/2011  Patient name: Brian [name] Medical record number: [id number] Date of birth: [birth year] Age: 35 y.o. Gender: male PCP: Ludwig Lean, MD  Medical Service:  Attending physician:  Larey Dresser, MD Resident (R2/R3): Emmaline Kluver, MD  Pager: [phone] Med Student (MS4): Doreatha Martin, MS4 Pager: [phone]  Chief Complaint: Left lower leg swelling and pain.  History of Present Illness: Pt is a 35 yo M with a history of HIV, CD4 620 (01/2011), and a history of DVT with PE, currently on coumadin presents to the ED with left leg pain and swelling.  Pt says that the leg has been very painful for two days, and that the swelling has gotten much worse over the past day.  He says that it is painful to the touch, warm, and red.  The pain is severe, and radiates from the distal foot area up to his knee and thigh.  He hasn't noticed any breaking of the skin or drainage in the foot, but did have a significantly sized boil on his L upper-inner thigh for over a week that recently ruptured and drained a significant amount of pus.  He has also noticed a more focal erythematous region on his upper L calf that has a small break in the skin, and has had a small amount of pustular drainage.  Walking and pressure make his symptoms worse, and he of note, has a history of ACL tear (unrepaired) and trauma to his left knee, that he was at an orthopaedic office visit yesterday to be evaluated for surgery, when they insisted he visit the ED for this pain and swelling.  He has not had any chest pain, SOB, nausea, fevers, chills associated with this.  There was no history of trauma to his LE.  He has had regular, non-bloody bowel movements.  He has a history of polycystic kidney disease, and had a bout of bloody urine a month ago that resolved, and has not had any blood, urinary pain, or difficulties since.    Current Outpatient Prescriptions  Medication Sig Dispense Refill  .  clotrimazole (LOTRIMIN) 1 % cream Apply topically 2 (two) times daily.        [name] efavirenz (SUSTIVA) 600 MG tablet Take 600 mg by mouth at bedtime.        [name] emtricitabine-tenofovir (TRUVADA) 200-300 MG per tablet Take one tablet by mouth Monday, Wednesday, and Friday  15 tablet  3  . HYDROcodone-acetaminophen (VICODIN) 5-500 MG per tablet Take 1 tablet by mouth 2 (two) times daily as needed for pain.  60 tablet  1  . lisinopril (PRINIVIL,ZESTRIL) 40 MG tablet Take 1 tablet (40 mg total) by mouth daily.  30 tablet  3  . triamcinolone (KENALOG) 0.1 % cream Apply topically 2 (two) times daily.        [name] warfarin (COUMADIN) 5 MG tablet Take 5 mg by mouth as directed.          Allergies: Review of patient's allergies indicates no known allergies.  Past Medical History  Diagnosis Date  . 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 Clover Mealy   . Hypertension   . Alcohol abuse   . Tobacco abuse   . Pulmonary nodule 10/11    Repeat CT Angio 01/2011>>Resolution of previously seen bibasilar pulmonary nodules.  These may have been related to some degree of pulmonary infarct given the large burden of pulmonary emboli seen previously..  . Thoracic aortic aneurysm 10/11  4cm fusiform aneurysm in ascending aorta found on evaluation during hospitalization (10/11)  Repeat CT Angio 2/12>> Stable dilatation of the ascending aorta when compared to the prior exam..  . Pulmonary embolism 10/11    Unprovoked. Hypercoag panel negative. Will receive 6 months anticoagulation.   . Polycystic kidney disease    History of MRSA infection   . HIV (human immunodeficiency virus infection) CD4 620 (01/2011) 4/11   L torn ACL, currently being evaluated by orthopaedics for possible surgery.     No past surgical history on file.  Family History  Problem Relation Age of Onset  . Coronary artery disease Mother   . Sleep apnea Diabetes Father   . Heart attack Maternal  Grandmother     History   Social History  . Marital Status: Single    Spouse Name: N/A    Number of Children: N/A  . Years of Education: N/A   Occupational History  . Dropped out of high school in 9th grade.  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    Social History Main Topics  . Smoking status: Current Everyday Smoker -- 0.5 packs/day for 13 years    Types: Cigarettes  . Smokeless tobacco: Not on file   Comment: See D. Tessitore  . Alcohol Use: Yes      Used to drink a gallon of vodka/day but cut down to1/2 gallon weekly now.   . Drug Use: Yes     Hx of Marijuana use.  [name] Sexually Active: Heterosexual, not currently sexually active   Other Topics Concern  .  Homeless, sometimes staying in shelter, and awaiting disability.   Social History Narrative   NCADAP apprv til 3/31/12Fax labs to [place] ( [place]  November 23, 2010 2:20 PMRyan White benefits approved: patient eligible for 100% discount for out patient labs and office visits.           Patient eligible for 100% discount for other services.Financial assistance [facility] and has [place]  July 09, 2010 6:10 PMDeborah [facility]  July 05, 2010 3:08 PMPT SAYS OK TO GIVE INFORMATION AND SPEAK TO [name], IN REFERENCE TO MEDICAL CARE.  EFFECTIVE 10-01-10 [name]    Review of Systems: Per HPI, otherwise a 10pt review of systems is negative.  Physical Exam: VS:  T: 98.5  HR: 92  R18  137/81  100% RA  General appearance: alert, cooperative and no distress Head: Normocephalic, without obvious abnormality, atraumatic Eyes: conjunctivae/corneas clear. PERRL, EOM's intact. Fundi benign. Neck: no adenopathy, no JVD, supple, symmetrical, trachea midline and thyroid not enlarged, symmetric, no tenderness/mass/nodules Back: negative Lungs: clear to auscultation bilaterally Heart: regular rate and  rhythm, S1, S2 normal, no murmur, click, rub or gallop Abdomen: abnormal findings:  mild tenderness in the LLQ Male genitalia: normal Extremities: edema in the left exremity, that is greatest at the ankle and decreases as it ascends up to the knee.  There is edema in the popliteal space as well as the distal thigh, that is mild.   and The left foot is warm to touch, erythematous, with pain on palpation and pressure.  The edema and erythema extends up the calf, decreasing in intensity as one moves proximally.  There are some very small puncal breaks in the skin on the medial L calf, without drainage.  There is a open 82mm diameter wound in the upper mid left  thigh, that has no drainage, pt said it did previously drain pus. Pulses: Pulses difficult to palpate in LE 2/2 swelling, otherwise 2+ radial, DP, and tib ant. Lymph nodes: Inguinal adenopathy: there are palpable, tender lymph nodes in the L inguinal canal Neurologic: Alert and oriented X 3, normal strength and tone. Normal symmetric reflexes. Normal coordination and gait  Lab results:   WBC                                      6.9               4.0-10.5         K/uL  RBC                                      2.26       l      4.22-5.81        MIL/uL  Hemoglobin (HGB)                         8.7        l      13.0-17.0        g/dL  Hematocrit (HCT)                         25.2       l      39.0-52.0        %  MCV                                      111.5      h      78.0-100.0       fL  MCH -                                    38.5       h      26.0-34.0        pg  MCHC                                     34.5              30.0-36.0        g/dL  RDW                                      13.1              11.5-15.5        %  Platelet Count (PLT)                     152               150-400          K/uL  Neutrophils, %  69                43-77            %  Lymphocytes, %                           21                12-46             %  Monocytes, %                             8                 3-12             %  Eosinophils, %                           2                 0-5              %  Basophils, %                             0                 0-1              %  Neutrophils, Absolute                    4.8               1.7-7.7          K/uL  Lymphocytes, Absolute                    1.4               0.7-4.0          K/uL  Monocytes, Absolute                      0.6               0.1-1.0          K/uL  Eosinophils, Absolute                    0.1               0.0-0.7          K/uL  Basophils, Absolute                      0.0               0.0-0.1          K/uL  Smear Review                             SEE NOTE.    MORPHOLOGY UNREMARKABLE   TCO2                                     14  0-100            mmol/L  Ionized Calcium                          1.06       l      1.12-1.32        mmol/L  Hemoglobin (HGB)                         9.2        l      13.0-17.0        g/dL  Hematocrit (HCT)                         27.0       l      39.0-52.0        %  Sodium (NA)                              134        l      135-145          mEq/L  Potassium (K)                            5.2        h      3.5-5.1          mEq/L  Chloride                                 110               96-112           mEq/L  Glucose                                  99                70-99            mg/dL  BUN                                      44         h      6-23             mg/dL  Creatinine                               3.9        h      0.4-1.5          Mg/dL    INR of 2.19  Imaging results:   Other results:  Negative LE dopplers for DVT or superficial VTE  Assessment & Plan by Problem:  Pt is a 35 yo M with a history of HIV, CD4 620 (01/2011), and a history of DVT with PE, and history of MRSA infection, currently on coumadin presents to the ED with left leg pain and swelling.  1.  Left leg pain/swelling  --  Pt has a history of DVT with PE last year (09/2010) and is currently completing 6 months of coumadin therapy, and is currently therapeutic with an INR of 2.19.  He had lower extremity dopplers performed in the ED that were negative for any DVT or superficial venous thrombosis, however did have enlarged lymph nodes in the left groin.  Additionally, pt also has a history of MRSA infection, and has had a recent history of a boil, and several skin breaks that have drained purulent material over the past week.  As the patient has had negative dopplers, and is therapeutic on coumadin, the likelihood of DVT is reduced.  This is likely LLE cellulitis, in the context of immunosuppression and a history of MRSA.  Unfortunately, he did not get blood cultures in the ED prior to empiric antibiotic treatment with Vancomycin and clindamycin, therefore blood cultures at this time are likely to be negative. -- empirically treat for LLE MRSA cellulitis with Vancomycin IV, pharmacy dosed -- L foot x-ray, and L knee x-ray to r/o osteomyelitis or fractures. -- may get MRI LLE to search for osteomyelitis if pt clinically decompensates (currently afebrile and without leukocytosis) -- continue morphine IV 2-4mg  q4hrs prn pain -- zofran 4mg  IV q4hrs prn nausea (pt not currently nauseated) -- place patient on contact precautions -- place patient on fall precautions -- will hold off on wound care at this point, but pt instructed to call for MD if he notices any more discharge so we can culture fluid from small opening in the skin. -- CBC, renal function panel, follow INR daily -- continue coumadin per pharmacy  2.  History of DVT/PE  -- pt has had a recent CT angio of the chest 01/22/2011 which showed resolution of nodules, and stable thoracic aortic aneurysm. -- continue coumadin per pharmacy -- daily PT/INR checks  3.  HIV infection, CD4 620 (01/2011) -- pt reports good compliance with home regimen -- continue home  regimen of sustiva 600mg  PO 1 tabled qhs, and truvada 200-300mg , 1 tablet MWF  4.  PSA - tobacco, alcohol, remote history of marijuana and other -- nicotine patch daily -- CIWA protocol (thiamine, folic acid, and ativan per protocol) -- social work consult to discuss smoking cessation  5.  Homelessness -- will have social work see pt to assist with d/c planning, and acquiring disability.  6.  HTN -- pt not currently hypertensive, will hold HTN medications as pt currently has infection, if needed, will restart patient on home dose of lisinopril  7.  Polycystic kidney disease, with CKD IV, baseline creatinine 1.8 -- I-STAT Cr is 3.9 which is above patient's baseline. Will recheck renal function, and hydrate if continues to be above patient's baseline, likely pre-renal but will pursue further work-up if necessary  -- will monitor with renal function panel -- all medications renally dosed if necessary -- renal diet  8.  VTE ppx -- pt currently on coumadin per above, will monitor INR  9. Anemia This is likely macrocytic anemia secondary to nutrition deficit as patient is homeless, and has hx of alcoholism. Last documented H&H was 10.4 and 29.7 respectively and it appears that patient's most recent baseline Hgb is around 11. Plan: -- check anemia panel -- continue to monitor H&H -- check FOBT   Doreatha Martin        [place]         PGY-2, [phone]     Attending Signature:

## 2011-02-24 LAB — PHOSPHORUS: Phosphorus: 5.9 mg/dL — ABNORMAL HIGH (ref 2.3–4.6)

## 2011-02-24 LAB — COMPREHENSIVE METABOLIC PANEL
ALT: 13 U/L (ref 0–53)
AST: 15 U/L (ref 0–37)
CO2: 19 mEq/L (ref 19–32)
Calcium: 8.5 mg/dL (ref 8.4–10.5)
Chloride: 107 mEq/L (ref 96–112)
GFR calc Af Amer: 27 mL/min — ABNORMAL LOW (ref >60)
GFR calc non Af Amer: 22 mL/min — ABNORMAL LOW (ref >60)
Sodium: 135 mEq/L (ref 135–145)
Total Bilirubin: 0.3 mg/dL (ref 0.3–1.2)

## 2011-02-24 LAB — CBC
HCT: 22.2 % — ABNORMAL LOW (ref 39.0–52.0)
Hemoglobin: 7.9 g/dL — ABNORMAL LOW (ref 13.0–17.0)
MCHC: 35.6 g/dL (ref 30.0–36.0)
MCV: 112.1 fL — ABNORMAL HIGH (ref 78.0–100.0)
RBC: 1.98 MIL/uL — ABNORMAL LOW (ref 4.22–5.81)
WBC: 4.4 10*3/uL (ref 4.0–10.5)

## 2011-02-24 LAB — FOLATE: Folate: 8.2 ng/mL

## 2011-02-25 DIAGNOSIS — Z86711 Personal history of pulmonary embolism: Secondary | ICD-10-CM

## 2011-02-25 DIAGNOSIS — M7989 Other specified soft tissue disorders: Secondary | ICD-10-CM

## 2011-02-25 DIAGNOSIS — L02419 Cutaneous abscess of limb, unspecified: Secondary | ICD-10-CM

## 2011-02-25 DIAGNOSIS — L03119 Cellulitis of unspecified part of limb: Secondary | ICD-10-CM

## 2011-02-25 LAB — COMPREHENSIVE METABOLIC PANEL
ALT: 14 U/L (ref 0–53)
AST: 17 U/L (ref 0–37)
BUN: 31 mg/dL — ABNORMAL HIGH (ref 6–23)
Calcium: 8.5 mg/dL (ref 8.4–10.5)
Chloride: 110 mEq/L (ref 96–112)
Creatinine, Ser: 3 mg/dL — ABNORMAL HIGH (ref 0.4–1.5)
Glucose, Bld: 90 mg/dL (ref 70–99)
Sodium: 135 mEq/L (ref 135–145)
Total Protein: 7.4 g/dL (ref 6.0–8.3)

## 2011-02-25 LAB — CBC
MCH: 38.9 pg — ABNORMAL HIGH (ref 26.0–34.0)
MCV: 110.6 fL — ABNORMAL HIGH (ref 78.0–100.0)
Platelets: 161 10*3/uL (ref 150–400)
RBC: 2.26 MIL/uL — ABNORMAL LOW (ref 4.22–5.81)
RDW: 12.7 % (ref 11.5–15.5)

## 2011-02-25 LAB — FUNGAL STAIN: Fungal Smear: NONE SEEN

## 2011-02-25 LAB — PROTIME-INR: Prothrombin Time: 23.1 seconds — ABNORMAL HIGH (ref 11.6–15.2)

## 2011-02-26 LAB — CBC
HCT: 23.2 % — ABNORMAL LOW (ref 39.0–52.0)
Hemoglobin: 8.3 g/dL — ABNORMAL LOW (ref 13.0–17.0)
MCH: 39.3 pg — ABNORMAL HIGH (ref 26.0–34.0)
MCHC: 35.8 g/dL (ref 30.0–36.0)
MCV: 110 fL — ABNORMAL HIGH (ref 78.0–100.0)
Platelets: 148 K/uL — ABNORMAL LOW (ref 150–400)
RBC: 2.11 MIL/uL — ABNORMAL LOW (ref 4.22–5.81)
RDW: 12.7 % (ref 11.5–15.5)
WBC: 3 K/uL — ABNORMAL LOW (ref 4.0–10.5)

## 2011-02-26 LAB — BASIC METABOLIC PANEL WITH GFR
BUN: 34 mg/dL — ABNORMAL HIGH (ref 6–23)
CO2: 19 meq/L (ref 19–32)
Calcium: 8.5 mg/dL (ref 8.4–10.5)
Chloride: 110 meq/L (ref 96–112)
Creatinine, Ser: 2.78 mg/dL — ABNORMAL HIGH (ref 0.4–1.5)
GFR calc non Af Amer: 26 mL/min — ABNORMAL LOW
Glucose, Bld: 94 mg/dL (ref 70–99)
Potassium: 5.6 meq/L — ABNORMAL HIGH (ref 3.5–5.1)
Sodium: 135 meq/L (ref 135–145)

## 2011-02-26 LAB — WOUND CULTURE

## 2011-02-26 LAB — PROTIME-INR: Prothrombin Time: 23.5 seconds — ABNORMAL HIGH (ref 11.6–15.2)

## 2011-02-27 ENCOUNTER — Encounter: Payer: Self-pay | Admitting: Internal Medicine

## 2011-02-27 DIAGNOSIS — L03119 Cellulitis of unspecified part of limb: Secondary | ICD-10-CM | POA: Insufficient documentation

## 2011-02-27 DIAGNOSIS — L02419 Cutaneous abscess of limb, unspecified: Secondary | ICD-10-CM

## 2011-02-27 LAB — BASIC METABOLIC PANEL
CO2: 20 mEq/L (ref 19–32)
Calcium: 8.4 mg/dL (ref 8.4–10.5)
Chloride: 110 mEq/L (ref 96–112)
GFR calc Af Amer: 29 mL/min — ABNORMAL LOW (ref >60)
Glucose, Bld: 88 mg/dL (ref 70–99)
Sodium: 136 mEq/L (ref 135–145)

## 2011-02-27 LAB — CBC
HCT: 32.6 % — ABNORMAL LOW (ref 39.0–52.0)
MCH: 38.4 pg — ABNORMAL HIGH (ref 26.0–34.0)
MCV: 90.1 fL (ref 78.0–100.0)
Platelets: 157 10*3/uL (ref 150–400)
RBC: 2.16 MIL/uL — ABNORMAL LOW (ref 4.22–5.81)
RDW: 12.7 % (ref 11.5–15.5)
RDW: 17.6 % — ABNORMAL HIGH (ref 11.5–15.5)
WBC: 3.1 10*3/uL — ABNORMAL LOW (ref 4.0–10.5)
WBC: 2.4 10*3/uL — ABNORMAL LOW (ref 4.0–10.5)

## 2011-02-27 LAB — PROTIME-INR
INR: 2.05 — ABNORMAL HIGH (ref 0.00–1.49)
Prothrombin Time: 22.7 seconds — ABNORMAL HIGH (ref 11.6–15.2)
Prothrombin Time: 23.3 seconds — ABNORMAL HIGH (ref 11.6–15.2)

## 2011-02-27 LAB — T-HELPER CELL (CD4) - (RCID CLINIC ONLY): CD4 % Helper T Cell: 36 % (ref 33–55)

## 2011-02-27 NOTE — Discharge Summary (Signed)
Date of admission: 02/23/2011  Date of Discharge: 02/27/2011  Brief Reason for Hospitalization:  1) Left Leg and foot cellulitis, wound cx MRSA positive, initially treated with IV Vanc and later transitioned to po doxy. 2) Acute on chronic renal insufficiency - pt with hx of polycystic kidney disease, and has follow up with Dr. Moshe Cipro in April. Pt was hydrated and renal function began to trend back to almost his baseline. Discussion about HD when pt follows up with renal. In meantime lisinopril will be stopped. 3) Anemia - secondary to chronic disease and iron deficiency. Pt was started on IV iron. FOBT negative. Baseline Hgb about 9-10. Hgb on discharge 8.3 4)Hx of Bilateral PE Oct. 2011 - continued coumadin, INR remained therapeutic 5) Homelessness - pt discharged to his mom's house to stay with for a few days 6)Hyperkalemia - likely secondary to acute on chronic renal failure, and lisinopril. Pt was instructed to stop taking lisinopril, and BP was stable, so no new medication was started.   Follow up Labs:  1)BMet for K and renal function 2)INR  Follow up issues:  1) Blood pressure - stopped Lisinopril. BP remained stable without any bp meds. Please start an alternative agent if blood pressure is high, one that is on [employer] formulary 2) Ensure cellulitis improved and that pt completed abx 3) Inadequate resources - pt is homeless.

## 2011-02-28 LAB — DIFFERENTIAL
Basophils Relative: 0 % (ref 0–1)
Eosinophils Absolute: 0 10*3/uL (ref 0.0–0.7)
Lymphs Abs: 1.6 10*3/uL (ref 0.7–4.0)
Monocytes Absolute: 0.4 10*3/uL (ref 0.1–1.0)
Monocytes Relative: 11 % (ref 3–12)

## 2011-02-28 LAB — BETA-2-GLYCOPROTEIN I ABS, IGG/M/A: Beta-2-Glycoprotein I IgA: 3 A Units (ref <20)

## 2011-02-28 LAB — COMPREHENSIVE METABOLIC PANEL
ALT: 14 U/L (ref 0–53)
Albumin: 3.1 g/dL — ABNORMAL LOW (ref 3.5–5.2)
Alkaline Phosphatase: 69 U/L (ref 39–117)
GFR calc Af Amer: 46 mL/min — ABNORMAL LOW (ref >60)
Potassium: 4.3 mEq/L (ref 3.5–5.1)
Sodium: 137 mEq/L (ref 135–145)
Total Protein: 6.8 g/dL (ref 6.0–8.3)

## 2011-02-28 LAB — PROTIME-INR
INR: 0.99 (ref 0.00–1.49)
INR: 1.03 (ref 0.00–1.49)
INR: 1.05 (ref 0.00–1.49)
INR: 1.12 (ref 0.00–1.49)
Prothrombin Time: 13.3 seconds (ref 11.6–15.2)
Prothrombin Time: 13.9 seconds (ref 11.6–15.2)
Prothrombin Time: 14.4 seconds (ref 11.6–15.2)
Prothrombin Time: 14.6 seconds (ref 11.6–15.2)
Prothrombin Time: 20.8 seconds — ABNORMAL HIGH (ref 11.6–15.2)

## 2011-02-28 LAB — PROTHROMBIN GENE MUTATION

## 2011-02-28 LAB — BASIC METABOLIC PANEL
BUN: 19 mg/dL (ref 6–23)
Calcium: 7.8 mg/dL — ABNORMAL LOW (ref 8.4–10.5)
Chloride: 105 mEq/L (ref 96–112)
Creatinine, Ser: 2.07 mg/dL — ABNORMAL HIGH (ref 0.4–1.5)
Creatinine, Ser: 2.22 mg/dL — ABNORMAL HIGH (ref 0.4–1.5)
GFR calc Af Amer: 41 mL/min — ABNORMAL LOW (ref >60)
GFR calc non Af Amer: 34 mL/min — ABNORMAL LOW (ref >60)
GFR calc non Af Amer: 37 mL/min — ABNORMAL LOW (ref >60)
GFR calc non Af Amer: 37 mL/min — ABNORMAL LOW (ref >60)
Potassium: 4.3 mEq/L (ref 3.5–5.1)
Potassium: 4.4 mEq/L (ref 3.5–5.1)
Sodium: 136 mEq/L (ref 135–145)

## 2011-02-28 LAB — POCT I-STAT, CHEM 8
BUN: 21 mg/dL (ref 6–23)
HCT: 36 % — ABNORMAL LOW (ref 39.0–52.0)
Hemoglobin: 12.2 g/dL — ABNORMAL LOW (ref 13.0–17.0)
Sodium: 139 mEq/L (ref 135–145)
TCO2: 22 mmol/L (ref 0–100)

## 2011-02-28 LAB — LUPUS ANTICOAGULANT PANEL
DRVVT: 49.6 secs — ABNORMAL HIGH (ref 36.2–44.3)
dRVVT Incubated 1:1 Mix: 41.8 secs (ref 36.2–44.3)

## 2011-02-28 LAB — BRAIN NATRIURETIC PEPTIDE: Pro B Natriuretic peptide (BNP): 138 pg/mL — ABNORMAL HIGH (ref 0.0–100.0)

## 2011-02-28 LAB — ANTITHROMBIN III: AntiThromb III Func: 119 % (ref 76–126)

## 2011-02-28 LAB — RAPID URINE DRUG SCREEN, HOSP PERFORMED
Amphetamines: NOT DETECTED
Tetrahydrocannabinol: NOT DETECTED

## 2011-02-28 LAB — CBC
HCT: 29.5 % — ABNORMAL LOW (ref 39.0–52.0)
Hemoglobin: 10.2 g/dL — ABNORMAL LOW (ref 13.0–17.0)
MCV: 90.1 fL (ref 78.0–100.0)
Platelets: 104 10*3/uL — ABNORMAL LOW (ref 150–400)
Platelets: 106 10*3/uL — ABNORMAL LOW (ref 150–400)
RBC: 3.24 MIL/uL — ABNORMAL LOW (ref 4.22–5.81)
RDW: 15.3 % (ref 11.5–15.5)
RDW: 15.8 % — ABNORMAL HIGH (ref 11.5–15.5)
WBC: 2.7 10*3/uL — ABNORMAL LOW (ref 4.0–10.5)
WBC: 2.7 10*3/uL — ABNORMAL LOW (ref 4.0–10.5)
WBC: 4.1 10*3/uL (ref 4.0–10.5)

## 2011-02-28 LAB — ETHANOL: Alcohol, Ethyl (B): 197 mg/dL — ABNORMAL HIGH (ref 0–10)

## 2011-02-28 LAB — PROTEIN S, TOTAL: Protein S Ag, Total: 122 % (ref 70–140)

## 2011-02-28 LAB — CARDIOLIPIN ANTIBODIES, IGG, IGM, IGA
Anticardiolipin IgG: 25 GPL U/mL — ABNORMAL HIGH (ref <23)
Anticardiolipin IgM: 1 MPL U/mL — ABNORMAL LOW (ref <11)

## 2011-03-03 LAB — T-HELPER CELL (CD4) - (RCID CLINIC ONLY)
CD4 % Helper T Cell: 26 % — ABNORMAL LOW (ref 33–55)
CD4 T Cell Abs: 380 uL — ABNORMAL LOW (ref 400–2700)

## 2011-03-04 ENCOUNTER — Other Ambulatory Visit: Payer: Self-pay | Admitting: Adult Health

## 2011-03-04 ENCOUNTER — Ambulatory Visit (INDEPENDENT_AMBULATORY_CARE_PROVIDER_SITE_OTHER): Payer: Self-pay | Admitting: Internal Medicine

## 2011-03-04 ENCOUNTER — Encounter: Payer: Self-pay | Admitting: Internal Medicine

## 2011-03-04 ENCOUNTER — Other Ambulatory Visit: Payer: Self-pay

## 2011-03-04 ENCOUNTER — Encounter: Payer: Self-pay | Admitting: Adult Health

## 2011-03-04 ENCOUNTER — Ambulatory Visit (INDEPENDENT_AMBULATORY_CARE_PROVIDER_SITE_OTHER): Payer: Self-pay | Admitting: Pharmacist

## 2011-03-04 DIAGNOSIS — B20 Human immunodeficiency virus [HIV] disease: Secondary | ICD-10-CM

## 2011-03-04 DIAGNOSIS — A4902 Methicillin resistant Staphylococcus aureus infection, unspecified site: Secondary | ICD-10-CM

## 2011-03-04 DIAGNOSIS — Z7901 Long term (current) use of anticoagulants: Secondary | ICD-10-CM

## 2011-03-04 DIAGNOSIS — S83509A Sprain of unspecified cruciate ligament of unspecified knee, initial encounter: Secondary | ICD-10-CM

## 2011-03-04 DIAGNOSIS — S83519A Sprain of anterior cruciate ligament of unspecified knee, initial encounter: Secondary | ICD-10-CM

## 2011-03-04 DIAGNOSIS — B9562 Methicillin resistant Staphylococcus aureus infection as the cause of diseases classified elsewhere: Secondary | ICD-10-CM

## 2011-03-04 DIAGNOSIS — I10 Essential (primary) hypertension: Secondary | ICD-10-CM

## 2011-03-04 DIAGNOSIS — I2699 Other pulmonary embolism without acute cor pulmonale: Secondary | ICD-10-CM

## 2011-03-04 DIAGNOSIS — N184 Chronic kidney disease, stage 4 (severe): Secondary | ICD-10-CM

## 2011-03-04 DIAGNOSIS — L0390 Cellulitis, unspecified: Secondary | ICD-10-CM

## 2011-03-04 LAB — BASIC METABOLIC PANEL
BUN: 32 mg/dL — ABNORMAL HIGH (ref 6–23)
Calcium: 8.8 mg/dL (ref 8.4–10.5)
Creatinine, Ser: 2.13 mg/dL — ABNORMAL HIGH (ref 0.4–1.5)
GFR calc Af Amer: 43 mL/min — ABNORMAL LOW (ref >60)

## 2011-03-04 LAB — URINE CULTURE: Colony Count: NO GROWTH

## 2011-03-04 LAB — CBC
MCHC: 35.7 g/dL (ref 30.0–36.0)
MCV: 97.8 fL (ref 78.0–100.0)
Platelets: 148 10*3/uL — ABNORMAL LOW (ref 150–400)
RBC: 4.63 MIL/uL (ref 4.22–5.81)
WBC: 5.7 10*3/uL (ref 4.0–10.5)

## 2011-03-04 LAB — DIFFERENTIAL
Basophils Relative: 0 % (ref 0–1)
Eosinophils Absolute: 0.2 10*3/uL (ref 0.0–0.7)
Lymphs Abs: 1.5 10*3/uL (ref 0.7–4.0)
Monocytes Relative: 15 % — ABNORMAL HIGH (ref 3–12)
Neutro Abs: 3.1 10*3/uL (ref 1.7–7.7)
Neutrophils Relative %: 55 % (ref 43–77)

## 2011-03-04 LAB — URINALYSIS, ROUTINE W REFLEX MICROSCOPIC
Ketones, ur: 15 mg/dL — AB
Nitrite: NEGATIVE
Protein, ur: >300 mg/dL — AB
Urobilinogen, UA: 0.2 mg/dL (ref 0.0–1.0)

## 2011-03-04 NOTE — Patient Instructions (Signed)
Make a follow up appointment in 1 week. Continue taking all medication as directed.

## 2011-03-04 NOTE — Patient Instructions (Signed)
Patient instructed to take medications as defined in the Anti-coagulation Track section of this encounter.  Patient instructed to take today's dose.  Patient verbalized understanding of these instructions.    

## 2011-03-04 NOTE — Assessment & Plan Note (Signed)
Stable. Continue coumadin until April/2012.

## 2011-03-04 NOTE — Assessment & Plan Note (Signed)
Patient to follow up with Ortho next week.

## 2011-03-04 NOTE — Assessment & Plan Note (Signed)
At goal with lisinopril. Continue current regimen. Check bmet.

## 2011-03-04 NOTE — Assessment & Plan Note (Addendum)
Sensitive to Tetracycline. Continue treatment for total 14 days of antibiotics. Physical exam shows improved but continued underlying infection. Patient needs close follow up. Reasses in one week for resolution of infection.
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
## 2011-03-04 NOTE — Progress Notes (Signed)
Anti-Coagulation Progress Note  Brian [name] is a 35 y.o. male who is currently on an anti-coagulation regimen.    RECENT RESULTS: Recent results are below, the most recent result is correlated with a dose of 77.5 mg. per week: Lab Results  Component Value Date   INR 2.7 03/04/2011   INR 1.98* 02/27/2011   INR 2.08* 02/26/2011    ANTI-COAG DOSE:   Latest dosing instructions   Total Sun Mon Tue Wed Thu Fri Sat   72.5 10 mg 12.5 mg 10 mg 12.5 mg 10 mg 7.5 mg 10 mg    (5 mg2) (5 mg2.5) (5 mg2) (5 mg2.5) (5 mg2) (5 mg1.5) (5 mg2)         ANTICOAG SUMMARY: Anticoagulation Episode Summary              Current INR goal 2.0-3.0 Next INR check 04/01/2011   INR from last check 2.7 (03/04/2011)     Weekly max dose (mg)  Target end date 03/23/2011   Indications PULMONARY EMBOLISM, Long term current use of anticoagulant   INR check location [facility]erred lab    Send INR reminders to [facility]   Comments        Provider Role Specialty Phone number   Larey Dresser  Internal Medicine [phone]        ANTICOAG TODAY: Anticoagulation Summary as of 03/04/2011              INR goal 2.0-3.0     Selected INR 2.7 (03/04/2011) Next INR check 04/01/2011   Weekly max dose (mg)  Target end date 03/23/2011   Indications PULMONARY EMBOLISM, Long term current use of anticoagulant    Anticoagulation Episode Summary              INR check location [facility] Preferred lab    Send INR reminders to [facility]   Comments        Provider Role Specialty Phone number   Larey Dresser  Internal Medicine [phone]        PATIENT INSTRUCTIONS: Patient Instructions  Patient instructed to take medications as defined in the Anti-coagulation Track section of this encounter.  Patient instructed to take today's dose.  Patient verbalized understanding of these instructions.        FOLLOW-UP Return in 4 weeks (on 04/01/2011) for Follow up INR.  Jorene Guest,  III Pharm.D., CACP

## 2011-03-04 NOTE — Assessment & Plan Note (Signed)
Check bmet today   

## 2011-03-05 ENCOUNTER — Encounter: Payer: Self-pay | Admitting: Adult Health

## 2011-03-05 LAB — T-HELPER CELL (CD4) - (RCID CLINIC ONLY)
CD4 % Helper T Cell: 41 % (ref 33–55)
CD4 T Cell Abs: 720 uL (ref 400–2700)

## 2011-03-05 LAB — CONVERTED CEMR LAB
ALT: 29 units/L (ref 0–53)
AST: 30 units/L (ref 0–37)
Alkaline Phosphatase: 54 units/L (ref 39–117)
Cholesterol: 126 mg/dL (ref 0–200)
Creatinine, Ser: 3.29 mg/dL — ABNORMAL HIGH (ref 0.40–1.50)
LDL Cholesterol: 63 mg/dL (ref 0–99)
Sodium: 136 meq/L (ref 135–145)
Total Bilirubin: 0.3 mg/dL (ref 0.3–1.2)
Total CHOL/HDL Ratio: 3.6
VLDL: 28 mg/dL (ref 0–40)
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
## 2011-03-06 LAB — HIV-1 RNA QUANT-NO REFLEX-BLD
HIV 1 RNA Quant: <20 copies/mL (ref <20)
HIV-1 RNA Quant, Log: <1.3 {Log} (ref <1.30)

## 2011-03-10 LAB — T-HELPER CELL (CD4) - (RCID CLINIC ONLY): CD4 T Cell Abs: 450 uL (ref 400–2700)

## 2011-03-12 ENCOUNTER — Encounter: Payer: Self-pay | Admitting: Internal Medicine

## 2011-03-12 ENCOUNTER — Ambulatory Visit (INDEPENDENT_AMBULATORY_CARE_PROVIDER_SITE_OTHER): Payer: Self-pay | Admitting: Internal Medicine

## 2011-03-12 VITALS — BP 114/71 | HR 74 | Temp 96.9°F | Ht 75.0 in | Wt 196.3 lb

## 2011-03-12 DIAGNOSIS — B9562 Methicillin resistant Staphylococcus aureus infection as the cause of diseases classified elsewhere: Secondary | ICD-10-CM

## 2011-03-12 DIAGNOSIS — A4902 Methicillin resistant Staphylococcus aureus infection, unspecified site: Secondary | ICD-10-CM

## 2011-03-12 DIAGNOSIS — L0291 Cutaneous abscess, unspecified: Secondary | ICD-10-CM

## 2011-03-12 MED ORDER — DOXYCYCLINE HYCLATE 100 MG PO CAPS: 100.0000 mg | ORAL_CAPSULE | Freq: Two times a day (BID) | ORAL | Status: AC

## 2011-03-12 MED ORDER — DOXYCYCLINE HYCLATE 50 MG PO CAPS: 50.0000 mg | ORAL_CAPSULE | Freq: Two times a day (BID) | ORAL | Status: DC

## 2011-03-12 NOTE — Assessment & Plan Note (Signed)
Improved but persistent lower left leg cellulitis. Will have patient continue Doxycycline for 7 more days. Follow up in 2 weeks.
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
## 2011-03-12 NOTE — Patient Instructions (Signed)
Make a follow up appointment in 2 weeks. Take all medication as directed.

## 2011-03-18 ENCOUNTER — Ambulatory Visit: Payer: Self-pay | Admitting: Adult Health

## 2011-03-27 ENCOUNTER — Ambulatory Visit (INDEPENDENT_AMBULATORY_CARE_PROVIDER_SITE_OTHER): Payer: Self-pay | Admitting: Internal Medicine

## 2011-03-27 ENCOUNTER — Encounter: Payer: Self-pay | Admitting: Internal Medicine

## 2011-03-27 VITALS — BP 138/88 | HR 93 | Temp 97.2°F | Ht 75.0 in | Wt 200.1 lb

## 2011-03-27 DIAGNOSIS — L02419 Cutaneous abscess of limb, unspecified: Secondary | ICD-10-CM

## 2011-03-27 DIAGNOSIS — L03119 Cellulitis of unspecified part of limb: Secondary | ICD-10-CM

## 2011-03-27 DIAGNOSIS — N184 Chronic kidney disease, stage 4 (severe): Secondary | ICD-10-CM

## 2011-03-27 LAB — IRON AND TIBC
%SAT: 43 % (ref 20–55)
Iron: 103 ug/dL (ref 42–165)
TIBC: 237 ug/dL (ref 215–435)
UIBC: 134 ug/dL

## 2011-03-27 LAB — FERRITIN: Ferritin: 300 ng/mL (ref 22–322)

## 2011-03-27 NOTE — Patient Instructions (Signed)
Return as needed. Your labwork when available will be faxed to [facility].

## 2011-03-27 NOTE — Assessment & Plan Note (Signed)
Need to get some labwork as per Dr. Harriette Bouillon from [facility].

## 2011-03-27 NOTE — Progress Notes (Signed)
  Subjective:    Patient ID: Brian [name], male    DOB: 06/05/1976, 35 y.o.   MRN: [id number]  HPI  Mr. who has a history of HIV disease, recent MRSA cellulitis, anterior cruciate ligament tear and chronic renal disease he is here for followup. He is known to complaint. His leg swelling has improved and isn't having pain in his leg area. He completed his antibiotic therapy.  Review of Systems  All other systems reviewed and are negative.       Objective:   Physical Exam  Constitutional: He is oriented to person, place, and time. He appears well-developed and well-nourished.  HENT:  Head: Normocephalic and atraumatic.  Right Ear: External ear normal.  Left Ear: External ear normal.  Mouth/Throat: Oropharynx is clear and moist.  Eyes: Conjunctivae and EOM are normal. Pupils are equal, round, and reactive to light. Right eye exhibits no discharge.  Neck: Normal range of motion. Neck supple.  Cardiovascular: Normal rate and regular rhythm.   Pulmonary/Chest: Effort normal and breath sounds normal. He has no wheezes. He has no rales.  Abdominal: Soft. Bowel sounds are normal. He exhibits no distension. There is no tenderness. There is no rebound and no guarding.  Musculoskeletal: Normal range of motion.  Neurological: He is alert and oriented to person, place, and time. He has normal reflexes.  Skin: Skin is warm.  Psychiatric: He has a normal mood and affect. His behavior is normal. Judgment and thought content normal.          Assessment & Plan:

## 2011-03-27 NOTE — Assessment & Plan Note (Signed)
His left leg cellulitis has now resolved. There is no edema. No further therapy is needed.

## 2011-03-28 LAB — PTH, INTACT AND CALCIUM
Calcium, Total (PTH): 7.9 mg/dL — ABNORMAL LOW (ref 8.4–10.5)
PTH: 153.1 pg/mL — ABNORMAL HIGH (ref 14.0–72.0)

## 2011-04-01 ENCOUNTER — Encounter: Payer: Self-pay | Admitting: Pharmacist

## 2011-04-01 ENCOUNTER — Ambulatory Visit (INDEPENDENT_AMBULATORY_CARE_PROVIDER_SITE_OTHER): Payer: Self-pay | Admitting: Pharmacist

## 2011-04-01 DIAGNOSIS — Z7901 Long term (current) use of anticoagulants: Secondary | ICD-10-CM

## 2011-04-01 DIAGNOSIS — I2699 Other pulmonary embolism without acute cor pulmonale: Secondary | ICD-10-CM

## 2011-04-01 LAB — POCT INR: INR: 3.7

## 2011-04-01 NOTE — Progress Notes (Signed)
Anti-Coagulation Progress Note  Brian [name] is a 35 y.o. male who is currently on an anti-coagulation regimen.  He is to undergo possibly a total joint replacement (knee) in May. Given he has come up to the 1 year anniversary of his initial index VTE---BUT, has a pending known high risk thrombogenic surgery pending--will elect (with discussion with patient) to CONTINUE warfarin up until time of surgery. He has been instructed to ask his orthopaedist the method by which the orthopaedist plans to prophylax postoperatively after the total knee replacement. Discussed with the patient the following options which he will discuss with the orthopaedist:  Warfarin post-op for 4 - 6 weeks; Lovenox for 10 days minimally after TJR; or rivaroxaban/Xarelto 10mg  po qd for 12 days postop after TKR.  RECENT RESULTS: Recent results are below, the most recent result is correlated with a dose of 77.5 mg. per week: Lab Results  Component Value Date   INR 3.7 04/01/2011   INR 2.7 03/04/2011   INR 1.98* 02/27/2011    ANTI-COAG DOSE:   Latest dosing instructions   Total Sun Mon Tue Wed Thu Fri Sat   75 10 mg 12.5 mg 10 mg 10 mg 12.5 mg 10 mg 10 mg    (5 mg2) (5 mg2.5) (5 mg2) (5 mg2) (5 mg2.5) (5 mg2) (5 mg2)         ANTICOAG SUMMARY: Anticoagulation Episode Summary              Current INR goal 2.0-3.0 Next INR check 04/22/2011   INR from last check 3.7! (04/01/2011)     Weekly max dose (mg)  Target end date 03/23/2011   Indications PULMONARY EMBOLISM, Long term current use of anticoagulant   INR check location [facility]erred [facility]    Send INR reminders to [facility]   Comments        Provider Role Specialty Phone number   Larey Dresser  Internal Medicine [phone]        ANTICOAG TODAY: Anticoagulation Summary as of 04/01/2011              INR goal 2.0-3.0     Selected INR 3.7! (04/01/2011) Next INR check 04/22/2011   Weekly max dose (mg)  Target end date 03/23/2011   Indications PULMONARY EMBOLISM, Long term current use of anticoagulant    Anticoagulation Episode Summary              IN[facility] check [facility] Preferred lab    Send INR reminders to [facility]   Comments        Provider Role Specialty Phone number   Larey Dresser  Internal Medicine [phone]        PATIENT INSTRUCTIONS: Patient Instructions  Patient instructed to take medications as defined in the Anti-coagulation Track section of this encounter.  Patient instructed to OMIT today's dose.  Patient verbalized understanding of these instructions.        FOLLOW-UP Return in 4 weeks (on 04/29/2011) for Follow up INR.  Brian [name], III Pharm.D., CACP

## 2011-04-01 NOTE — Patient Instructions (Signed)
Patient instructed to take medications as defined in the Anti-coagulation Track section of this encounter.  Patient instructed to OMIT today's dose.  Patient verbalized understanding of these instructions.    

## 2011-04-02 ENCOUNTER — Ambulatory Visit (INDEPENDENT_AMBULATORY_CARE_PROVIDER_SITE_OTHER): Payer: Self-pay | Admitting: Adult Health

## 2011-04-02 VITALS — BP 130/94 | HR 89 | Temp 97.9°F | Ht 75.0 in | Wt 196.0 lb

## 2011-04-02 DIAGNOSIS — B20 Human immunodeficiency virus [HIV] disease: Secondary | ICD-10-CM

## 2011-04-02 DIAGNOSIS — N184 Chronic kidney disease, stage 4 (severe): Secondary | ICD-10-CM

## 2011-04-02 LAB — CBC WITH DIFFERENTIAL/PLATELET
Basophils Absolute: 0 10*3/uL (ref 0.0–0.1)
Basophils Relative: 0 % (ref 0–1)
MCHC: 33.2 g/dL (ref 30.0–36.0)
Neutro Abs: 1.6 10*3/uL — ABNORMAL LOW (ref 1.7–7.7)
Neutrophils Relative %: 45 % (ref 43–77)
RDW: 12.4 % (ref 11.5–15.5)

## 2011-04-02 LAB — COMPREHENSIVE METABOLIC PANEL
ALT: 64 U/L — ABNORMAL HIGH (ref 0–53)
CO2: 20 mEq/L (ref 19–32)
Potassium: 5.4 mEq/L — ABNORMAL HIGH (ref 3.5–5.3)
Sodium: 137 mEq/L (ref 135–145)
Total Bilirubin: 0.4 mg/dL (ref 0.3–1.2)
Total Protein: 6.9 g/dL (ref 6.0–8.3)

## 2011-04-02 NOTE — Progress Notes (Signed)
Brian [name] returns to clinic for the first time in 3 months for followup of his HIV. Since that time. He has been admitted to the hospital with cellulitis of the left lower extremity. He was seen by orthopedics at some time prior to his admission and was found to have swelling of the left lower leg. He has yet to have surgery on his left knee as planned and is scheduled to return to orthopedics to have this evaluated. He has been adherent to his HIV medications with good tolerance. He takes his to about on Mondays, Wednesdays, and Fridays, and Sustiva daily. He complains of no problems with these medications. Currently he denies any swelling to that left lower leg and has had no problems since his treatment for his cellulitis. He still has pain in his left knee and will again have that evaluated by orthopedics. Otherwise, he has not had any other problems.   Review of systems Gen.: Denies chills, fever, fatigue, loss of appetite, malaise, sleep disorder, sweats, weakness, or weight loss. Eyes: Denies blurring discharge, double vision, eye irritation, eye pain, a [employer], itching, light sensitivity, read. I vision, loss. ENT: Denies decreased hearing, difficulty swallowing, ear discharge, ear ache hoarseness, nasal congestion, nosebleeds, postnasal drainage, tinnitus, sinus pressure, or sore throat. He does complain of having some loose teeth and chipped teeth in his oropharynx and some generalized need for dental care. CV:  denies bluish discolorations chest pain or discomfort, difficulty breathing at night, difficulty breathing while lying down thinking, fatigue, leg cramps with exertion, lightheadedness, near syncope, palpitations, shortness of breath with exertion, swelling of feet swelling of hands or weight gain. Respiratory: Denies chest discomfort, chest pain with inspiration. Denies cough, hemoptysis, excessive snoring, morning headaches, pleuritic chest pain, shortness of breath, productive sputum, or  wheezing. GI: Denies abdominal pain, but he stools, change in bowel habits, constipation, dark tarry stools, diarrhea, excessive appetite, gas, hemorrhoids, indigestion, loss of appetite, nausea, vomiting hematemesis, or jaundice. GU: Denies decreased libido, discharge, dysuria, erectile dysfunction, genital sores, hematuria, incontinence, nocturia, urinary frequency, or urinary hesitancy. MS: Remains with joint pain to the left knee with limited range of motion. Some joint redness to the left knee with some joint swelling to the left knee. Denies loss of strength. Low back pain mid back pain, muscle aches or muscle cramps. Denies muscle weakness or thoracic pain, but does complain of stiffness in the left knee joint. Derm: Denies change in color of skin changes, and nail beds, dryness excessive perspiration, flushing, hair loss, itching, skin lesions, rashes, or poor wound healing. Neuro: Denies brief paralysis, difficulty with concentration, coordination disturbances falling down. Headaches, inability to speak, memory loss, numbness, poor balance, seizures, tingling, tremors, visual disturbances, or weakness. Psych: Denies alternate hallucination, body or visual anxiety, depression, agitation, tearfulness, irritability, mental problems, panic attacks suicidal thoughts or plans.  Physical examination  Gen.: Well-developed, well-nourished, no acute distress. HEENT: Normocephalic, atraumatic, right and left ear normal limits, oropharynx is clear and moist, no oropharyngeal exudates noted. He has multiple loose teeth, and his oropharynx with poor dentition and some chipped teeth as well. There is a high level of plaque noted on all dental surfaces with some gingival hyperplasia. Eyes: PERRLA, conjunctiva normal, EOM F., no discharge. No scleral icterus. Neck: Full range of motion, supple, no thyromegaly. No tracheal deviation no stridor. No JVD. No cervical adenopathy. CV: Normal weight. Regular rhythm.  Heart sounds are normal. Intact distal pulses. No murmurs, gallops or rubs noted. Pulmonary/chest wall: Normal effort.  Breath sounds are clear to auscultation bilaterally. No respiratory distress. No wheezes no rales. No chest tenderness. Abdomen: Soft. Bowel sounds active. No distention. No tenderness. No rebound no guarding. No mass, no hepatosplenomegaly. MS: Range of motion is limited to the left knee with limited extension and flexion of the joint. Otherwise, there is no other edema or tenderness noted to any of the other joints or long bones. Extremities: No clubbing, no edema. No cyanosis intact pulses. Neuro: Alert, oriented x3. No cranial nerve deficits, nor abnormal coordination, no abnormal tone. Skin: Warm, dry, no erythema. No rash. No pallor. Psych: Mood and affect appear normal. Behavior appears normal. Thought content appropriate and judgment appears normal.  Assessment and plan  1. HIV. Labs drawn on 03/04/2011 show a CD4 count of 720 at 41% with a viral load of less than 20 copies per mL. Given that his most recent GFR by CG was at 38.4 and by MDRD at 25.5, we will continue his Truvada every Monday, Wednesday, and Friday, as well as continuing his Sustiva daily. We will have him return to clinic in approximately 10 weeks for repeat labs and a followup in 3 months.  2. Stage IV renal disease. He is continued to be followed by the renal service and has not yet been assessed to need hemodialysis. Dose adjustments for his current medications will be monitored on a regular basis and he is encouraged to continue his followup with renal at this time. Meanwhile, today we will obtain a repeat CBC and CMP.  3. Pre-odontoid disease. We recommend that he complete an application so he be seen by our dental services here and have his teeth evaluated and treated appropriately. He needs to followup with the MA to obtain these papers. He was instructed on this matter and verbally acknowledged.  4.  Left knee anterior cruciate ligament problems. He is instructed to continue his followup with orthopedic services as they are planning a future surgery date to repair his left knee.  He verbally acknowledged all information provided to him and verbally agreed to this plan of care.

## 2011-04-02 NOTE — Progress Notes (Signed)
  Subjective:    Patient ID: Brian [name], male    DOB: 1975/12/27, 34 y.o.   MRN: [id number]  HPI    Review of Systems     Objective:   Physical Exam        Assessment & Plan:

## 2011-04-21 ENCOUNTER — Other Ambulatory Visit: Payer: Self-pay | Admitting: Internal Medicine

## 2011-04-22 ENCOUNTER — Telehealth: Payer: Self-pay | Admitting: *Deleted

## 2011-04-22 NOTE — Telephone Encounter (Signed)
Pt called to ask why his pain med refill was only for 30 this month. Spoke to dr Loyola Mast, he states that he is expecting pt to have knee surgery soon and he would think ortho, dr Latanya Maudlin would be prescribing pain med at that point, i then spoke w/ pt again and he states that his surgery has been postponed for possibly 2-3 months due to the fact he is basically homeless and dr Latanya Maudlin will not do surg until pt has a stable home. He states he is working with several agencies to obtain housing.

## 2011-04-22 NOTE — Telephone Encounter (Signed)
Prescription telephoned in.

## 2011-04-23 NOTE — Telephone Encounter (Signed)
Pharmacy was called and notified to dispense Vicodin 5-500 mg 60 tablets for this month. Patient had not picked up medication.

## 2011-04-23 NOTE — Telephone Encounter (Signed)
Thank you for the extra information. As patient is not having surgery. Will have pharmacy dispense the other 30 tablets of Vicodin to complete his prescription for this month.

## 2011-04-29 ENCOUNTER — Ambulatory Visit: Payer: Self-pay

## 2011-04-29 ENCOUNTER — Ambulatory Visit (INDEPENDENT_AMBULATORY_CARE_PROVIDER_SITE_OTHER): Payer: Self-pay | Admitting: Pharmacist

## 2011-04-29 DIAGNOSIS — Z7901 Long term (current) use of anticoagulants: Secondary | ICD-10-CM

## 2011-04-29 DIAGNOSIS — I2699 Other pulmonary embolism without acute cor pulmonale: Secondary | ICD-10-CM

## 2011-04-29 DIAGNOSIS — B20 Human immunodeficiency virus [HIV] disease: Secondary | ICD-10-CM

## 2011-04-29 LAB — POCT INR: INR: 2.6

## 2011-04-29 MED ORDER — WARFARIN SODIUM 5 MG PO TABS: ORAL_TABLET | ORAL | Status: DC

## 2011-04-29 NOTE — Patient Instructions (Signed)
Patient instructed to take medications as defined in the Anti-coagulation Track section of this encounter.  Patient instructed to take today's dose.  Patient verbalized understanding of these instructions.    

## 2011-05-01 LAB — TB SKIN TEST
Induration: 0
TB Skin Test: NEGATIVE mm

## 2011-05-06 NOTE — Progress Notes (Signed)
Vital Signs:  Patient profile: 35 year old male Height:    75 inches (190.50 cm) Weight:    193.2 pounds (87.82 kg) BMI:  24.24 Temp:  97.9 degrees F (36.61 degrees C) oral Pulse rate: 121 / minute BP sitting: 112 / 83  (left arm)  Vitals Entered By: Janyce Llanos CMA (February 04, 2011 2:24 PM) CC: follow-up visit for labs. Pt states his knee is hurting, it popped out of place abt a week ago and has hurt every since Is Patient Diabetic? No Pain Assessment Patient in pain? yes     Location: knee Intensity: 6 Type: aching Onset of pain  1 week Nutritional Status BMI of 19 -24 = normal Nutritional Status Detail appetite "varies"  Have you ever been in a relationship where you felt threatened, hurt or afraid?No   Does patient need assistance?  Functional Status Self care Ambulation Normal Comments no missed doses   CC:  follow-up visit for labs. Pt states his knee is hurting and it popped out of place abt a week ago and has hurt every since.  History of Present Illness: ` presents for followup with chief complaint a right knee hurting. 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 Adherent to antiretrovirals with good tolerance and no complications.  Preventive Screening-Counseling & Management  Alcohol-Tobacco     Alcohol drinks/day: weekends     Alcohol type: vodka     Smoking Status: current     Smoking Cessation Counseling: yes     Packs/Day: 0.5     Year Quit: started back today  Caffeine-Diet-Exercise     Caffeine use/day: coffee one a day     Does Patient Exercise: no  Hep-HIV-STD-Contraception     HIV Risk: risk noted  Safety-Violence-Falls     Seat Belt Use: yes     Helmet Use: yes      Sexual History:  n/a.        Drug Use:  never.    Comments:  pt declined condoms today  Allergies (verified):  No Known Drug Allergies  Review of Systems   The patient denies anorexia, fever,  weight loss, weight gain, vision loss, decreased hearing, hoarseness, chest pain, syncope, dyspnea on exertion, peripheral edema, prolonged cough, headaches, hemoptysis, abdominal pain, melena, hematochezia, severe indigestion/heartburn, hematuria, incontinence, genital sores, muscle weakness, suspicious skin lesions, transient blindness, difficulty walking, depression, unusual weight change, abnormal bleeding, enlarged lymph nodes, angioedema, and testicular masses.          as per history of present illness  Physical Exam  General:  alert, well-developed, well-nourished, and well-hydrated.   Head:  normocephalic and atraumatic.   Eyes:  vision grossly intact, pupils equal, pupils round, and pupils reactive to light.   Ears:  R ear normal and L ear normal.   Mouth:  no gingival abnormalities, no dental plaque, pharynx pink and moist, and fair dentition.   Neck:  supple and full ROM.   Lungs:  normal breath sounds.   Heart:  normal rate and regular rhythm.   Abdomen:  soft, non-tender, and normal bowel sounds.   Msk:  decreased ROM, joint tenderness, and joint swelling.   all relative to right knee. Neurologic:  alert & oriented X3, cranial nerves II-XII intact, and gait normal.   Skin:  no rashes and no suspicious lesions.   Psych:  Oriented X3, memory intact for recent and remote, and normally interactive.  Impression & Recommendations:  Problem # 1:  HIV INFECTION (ICD-042)  CD4 count 620 at  29% with a viral load less than 20 copies per mL. Given long-standing history of dyslipidemia , and long-term use Combivir, the change in his therapy my be warranted. His GFR remains greater than 30 but less than 50. We will switch his Combivir to Truvada 1 tablet every Monday-Wednesday- Friday. Drug effects , side effects potential adverse drug reactions and toxicities were discussed in detail verbally acknowledged all information provided, and agreed with plan of care.    we will repeat labs in 4  weeks followup in 6 weeks to monitor toxicity clinical response. His updated medication list for this problem includes:    Doxycycline Hyclate 100 Mg Tabs (Doxycycline hyclate) .[name]... Take 1 tablet by mouth two times a day for 10 days.  Problem # 2:  KNEE PAIN, LEFT (ICD-719.46)  left knee pain associated with this mild, dislocation of the left knee. At this point , nonsteroidal therapy of ibuprofen 600 mg every 6 hours plus local moist heat with active range of motion should be adequate at this time. If symptoms worsen or dislocation occurs again , we will order x-rays. Verbally analysis information and agreed with plan of care.  Medications Added to Medication List This Visit: 1)  Combivir 150-300 Mg Tabs (Lamivudine-zidovudine) .... discontinue combivir 2)  Lipitor 40 Mg Tabs (Atorvastatin calcium) .... Once daily 3)  Truvada 200-300 Mg Tabs (Emtricitabine-tenofovir) .[name].. 1 tablet by mouth every monday-wednesday-friday  Other Orders: Influenza Vaccine NON MCR [id number]) Prescriptions: COMBIVIR 150-300 MG TABS (LAMIVUDINE-ZIDOVUDINE) DISCONTINUE COMBIVIR  #1 x 0  Entered and Authorized by: Marlow Baars NP  Signed by: Marlow Baars NP on 02/04/2011  Method used: Print then Give to Patient  RxID: [id number] TRUVADA 200-300 MG TABS (EMTRICITABINE-TENOFOVIR) 1 TABLET by mouth EVERY MONDAY-WEDNESDAY-FRIDAY  #15 x 5  Entered and Authorized by: Marlow Baars NP  Signed by: Marlow Baars NP on 02/04/2011  Method used: Print then Give to Patient  RxID: [id number]    Orders Added: 1)  Influenza Vaccine NON MCR [id number]   Immunizations Administered:  Influenza Vaccine # 1:    Vaccine Type: Fluvax Non-MCR    Site: left deltoid    Mfr: GlaxoSmithKline    Dose: 0.5 ml    Route: IM    Given by: Janyce Llanos CMA    Exp. Date: 06/15/2011    Lot #: [id number]    VIS given: 07/10/10 version given February 04, 2011.  Flu Vaccine Consent Questions:    Do you  have a history of severe allergic reactions to this vaccine? no    Any prior history of allergic reactions to egg and/or gelatin? no    Do you have a sensitivity to the preservative Thimersol? no    Do you have a past history of Guillan-Barre Syndrome? no    Do you currently have an acute febrile illness? no    Have you ever had a severe reaction to latex? no    Vaccine information given and explained to patient? yes   Immunizations Administered:  Influenza Vaccine # 1:    Vaccine Type: Fluvax Non-MCR    Site: left deltoid    Mfr: GlaxoSmithKline    Dose: 0.5 ml    Route: IM    Given by: Janyce Llanos CMA    Exp. Date: 06/15/2011    Lot #: [id number]    VIS given: 07/10/10 version given February 04, 2011.  Signed by Marlow Baars NP on 05/06/2011 at 8:12 AM  ________________________________________________________________________

## 2011-05-27 NOTE — Progress Notes (Signed)
Addended by: Truddie Crumble on: 05/27/2011 02:32 PM   Modules accepted: Orders

## 2011-05-29 ENCOUNTER — Other Ambulatory Visit: Payer: Self-pay | Admitting: *Deleted

## 2011-05-29 DIAGNOSIS — B9562 Methicillin resistant Staphylococcus aureus infection as the cause of diseases classified elsewhere: Secondary | ICD-10-CM

## 2011-05-29 NOTE — Telephone Encounter (Signed)
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  Wants refill on doxy. Pt states he has boils on side of leg and 2 on buttocks.   Onset 2 days ago He wants doxy from [facility] on [address].  Needs to be capsules !!  Pt # [id number]

## 2011-05-30 MED ORDER — DOXYCYCLINE HYCLATE 50 MG PO CAPS: 50.0000 mg | ORAL_CAPSULE | Freq: Two times a day (BID) | ORAL | Status: AC

## 2011-05-30 MED ORDER — HYDROCODONE-ACETAMINOPHEN 5-500 MG PO TABS: 1.0000 | ORAL_TABLET | Freq: Three times a day (TID) | ORAL | Status: DC | PRN

## 2011-05-30 NOTE — Telephone Encounter (Signed)
If symptoms worsen patient need to go to the ED or make an appointment in clinic.

## 2011-05-30 NOTE — Telephone Encounter (Signed)
Rx called in and pt informed 

## 2011-06-03 ENCOUNTER — Ambulatory Visit: Payer: Self-pay

## 2011-06-25 ENCOUNTER — Other Ambulatory Visit: Payer: Self-pay | Admitting: Internal Medicine

## 2011-06-25 NOTE — Telephone Encounter (Signed)
No narc contract No UDS Ran name through [employer] and OK Refill and sch appt with PCP in [place] and UDS MUST be done at next visit.

## 2011-06-26 NOTE — Telephone Encounter (Signed)
Vicodin rx called to [facility]; message sent to front desk for an appt.

## 2011-07-03 ENCOUNTER — Other Ambulatory Visit: Payer: Self-pay | Admitting: Infectious Diseases

## 2011-07-03 ENCOUNTER — Other Ambulatory Visit: Payer: Self-pay

## 2011-07-03 DIAGNOSIS — B20 Human immunodeficiency virus [HIV] disease: Secondary | ICD-10-CM

## 2011-07-04 LAB — CBC WITH DIFFERENTIAL/PLATELET
Basophils Absolute: 0 10*3/uL (ref 0.0–0.1)
Basophils Relative: 0 % (ref 0–1)
Eosinophils Absolute: 0.1 10*3/uL (ref 0.0–0.7)
MCH: 32.3 pg (ref 26.0–34.0)
MCHC: 33.2 g/dL (ref 30.0–36.0)
Neutro Abs: 2.7 10*3/uL (ref 1.7–7.7)
Neutrophils Relative %: 58 % (ref 43–77)
Platelets: 178 10*3/uL (ref 150–400)
RBC: 3.47 MIL/uL — ABNORMAL LOW (ref 4.22–5.81)

## 2011-07-04 LAB — COMPREHENSIVE METABOLIC PANEL
ALT: 28 U/L (ref 0–53)
AST: 25 U/L (ref 0–37)
Alkaline Phosphatase: 72 U/L (ref 39–117)
Potassium: 5.5 mEq/L — ABNORMAL HIGH (ref 3.5–5.3)
Sodium: 134 mEq/L — ABNORMAL LOW (ref 135–145)
Total Bilirubin: 0.3 mg/dL (ref 0.3–1.2)
Total Protein: 7.1 g/dL (ref 6.0–8.3)

## 2011-07-04 LAB — HIV-1 RNA QUANT-NO REFLEX-BLD: HIV-1 RNA Quant, Log: <1.3 {Log} (ref <1.30)

## 2011-07-12 ENCOUNTER — Other Ambulatory Visit: Payer: Self-pay | Admitting: Internal Medicine

## 2011-07-12 NOTE — Telephone Encounter (Signed)
Patient has appointment for August 6; please advise him that he needs to keep that appointment.

## 2011-07-12 NOTE — Telephone Encounter (Signed)
Vicodin 5/500mg  rx called to [facility]; pt was called and instructed to keep his next appt.

## 2011-07-16 ENCOUNTER — Other Ambulatory Visit: Payer: Self-pay | Admitting: Pharmacist

## 2011-07-16 DIAGNOSIS — Z7901 Long term (current) use of anticoagulants: Secondary | ICD-10-CM

## 2011-07-16 DIAGNOSIS — I2699 Other pulmonary embolism without acute cor pulmonale: Secondary | ICD-10-CM

## 2011-07-16 MED ORDER — WARFARIN SODIUM 5 MG PO TABS: ORAL_TABLET | ORAL | Status: DC

## 2011-07-17 ENCOUNTER — Encounter: Payer: Self-pay | Admitting: Adult Health

## 2011-07-17 ENCOUNTER — Ambulatory Visit (INDEPENDENT_AMBULATORY_CARE_PROVIDER_SITE_OTHER): Payer: Self-pay | Admitting: Adult Health

## 2011-07-17 ENCOUNTER — Ambulatory Visit: Payer: Self-pay

## 2011-07-17 DIAGNOSIS — Q613 Polycystic kidney, unspecified: Secondary | ICD-10-CM

## 2011-07-17 DIAGNOSIS — Z79899 Other long term (current) drug therapy: Secondary | ICD-10-CM

## 2011-07-17 DIAGNOSIS — B20 Human immunodeficiency virus [HIV] disease: Secondary | ICD-10-CM

## 2011-07-17 DIAGNOSIS — I10 Essential (primary) hypertension: Secondary | ICD-10-CM

## 2011-07-22 ENCOUNTER — Ambulatory Visit: Payer: Self-pay | Admitting: Internal Medicine

## 2011-07-29 ENCOUNTER — Other Ambulatory Visit: Payer: Self-pay | Admitting: Internal Medicine

## 2011-07-29 NOTE — Telephone Encounter (Signed)
No showed follow-up appointment on August 6.  Has an appointment on August 14 (tomorrow).  Last picked up 30 tablets on 07/13/2011.  It is unclear what his usual dose is and if this is to be given chronically.  He must show to his appointment tomorrow, submit to a UDS, and complete a pain contract if this medication is appropriate for long term use.

## 2011-07-30 ENCOUNTER — Ambulatory Visit (INDEPENDENT_AMBULATORY_CARE_PROVIDER_SITE_OTHER): Payer: Self-pay | Admitting: Internal Medicine

## 2011-07-30 ENCOUNTER — Encounter: Payer: Self-pay | Admitting: Internal Medicine

## 2011-07-30 VITALS — BP 117/73 | HR 83 | Temp 98.2°F | Ht 75.0 in | Wt 193.9 lb

## 2011-07-30 DIAGNOSIS — I10 Essential (primary) hypertension: Secondary | ICD-10-CM

## 2011-07-30 DIAGNOSIS — S83509A Sprain of unspecified cruciate ligament of unspecified knee, initial encounter: Secondary | ICD-10-CM

## 2011-07-30 DIAGNOSIS — S83519A Sprain of anterior cruciate ligament of unspecified knee, initial encounter: Secondary | ICD-10-CM

## 2011-07-30 DIAGNOSIS — G8929 Other chronic pain: Secondary | ICD-10-CM | POA: Insufficient documentation

## 2011-07-30 MED ORDER — HYDROCODONE-ACETAMINOPHEN 5-500 MG PO TABS: 1.0000 | ORAL_TABLET | Freq: Two times a day (BID) | ORAL | Status: DC | PRN

## 2011-07-30 NOTE — Assessment & Plan Note (Signed)
BP Readings from Last 3 Encounters:  07/30/11 117/73  07/17/11 116/84  04/02/11 [id number]    Basic Metabolic Panel:    Component Value Date/Time   NA 134* 07/03/2011 1512   K 5.5* 07/03/2011 1512   CL 106 07/03/2011 1512   CO2 18* 07/03/2011 1512   BUN 42* 07/03/2011 1512   CREATININE 3.10* 07/03/2011 1512   CREATININE 3.29* 03/04/2011 2052   GLUCOSE 87 07/03/2011 1512   CALCIUM 8.6 07/03/2011 1512   CALCIUM 7.9* 03/27/2011 1156    Assessment: Hypertension Control: controlled  Progress toward goals: at goal  Barriers to meeting goals: no barriers identified   Plan: Hypertension treatment:   - continue current medications, the patient follows with [facility]. - The patient is recommended to address need for her lisinopril refill with his kidney doctors, who manages medication in the setting of his stage 4 chronic kidney disease.

## 2011-07-30 NOTE — Patient Instructions (Signed)
   Please follow-up at the clinic in 2 months with your PCP, at which time we will reevaluate your knee pain, and see where you are in terms of the surgery.  Please follow-up in the clinic sooner if needed.  If you have been started on new medication(s), and you develop throat closing, tongue swelling, rash, please stop the medication and call the clinic at [phone] and go to the ER.  If you are diabetic, please bring your meter to your next visit.  If symptoms worsen, or new symptoms arise, please call the clinic or go to the ER.  Please bring all of your medications in a bag to your next visit.

## 2011-07-30 NOTE — Assessment & Plan Note (Addendum)
Patient has evidence of anterior cruciate ligament tear on MRI in August 2011, is also consistent with physical exam findings today. He has been evaluated by Dr. Rhona Raider, orthopedics, who recommended surgery he wants the patient is able to secure a home environment to allow for postoperative recovery for at least 4 months. At this time, the patient notes he will be meeting with the housing commissioned tomorrow in hopes to [place]. Given that the patient has organic reason for his pain, I feel comfortable refilling his pain medications today. We will sign a new pain contract today. - Refill given a Vicodin 5 500, #60. - Contract initiated to allow for Vicodin 5/500 mg every 12 hours when necessary, #60 per month. The details of the pain contract were explicitly discussed with the patient. He agrees to the terms of the contract and understands the implications of contract violation. - Will plan to have him f/u with Dr. Rhona Raider after getting his housing secured. - Urine drug screen to be checked today
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
## 2011-07-31 LAB — DRUGS OF ABUSE SCREEN W/O ALC, ROUTINE URINE
Amphetamine Screen, Ur: NEGATIVE
Benzodiazepines.: NEGATIVE
Cocaine Metabolites: NEGATIVE
Marijuana Metabolite: NEGATIVE
Opiate Screen, Urine: NEGATIVE
Phencyclidine (PCP): NEGATIVE
Propoxyphene: NEGATIVE

## 2011-08-02 ENCOUNTER — Other Ambulatory Visit: Payer: Self-pay | Admitting: Licensed Clinical Social Worker

## 2011-08-02 DIAGNOSIS — B20 Human immunodeficiency virus [HIV] disease: Secondary | ICD-10-CM

## 2011-08-02 MED ORDER — EMTRICITABINE-TENOFOVIR DF 200-300 MG PO TABS: ORAL_TABLET | ORAL | Status: DC

## 2011-08-05 ENCOUNTER — Ambulatory Visit (INDEPENDENT_AMBULATORY_CARE_PROVIDER_SITE_OTHER): Payer: Self-pay | Admitting: Pharmacist

## 2011-08-05 DIAGNOSIS — Z7901 Long term (current) use of anticoagulants: Secondary | ICD-10-CM

## 2011-08-05 DIAGNOSIS — I2699 Other pulmonary embolism without acute cor pulmonale: Secondary | ICD-10-CM

## 2011-08-05 MED ORDER — WARFARIN SODIUM 5 MG PO TABS: ORAL_TABLET | ORAL | Status: DC

## 2011-08-05 NOTE — Progress Notes (Signed)
Anti-Coagulation Progress Note  Brian [name] is a 35 y.o. male who is currently on an anti-coagulation regimen.    RECENT RESULTS: Recent results are below, the most recent result is correlated with a dose of 75 mg. per week: Lab Results  Component Value Date   INR 3.7 08/05/2011   INR 2.6 04/29/2011   INR 3.7 04/01/2011    ANTI-COAG DOSE:   Latest dosing instructions   Total Sun Mon Tue Wed Thu Fri Sat   70 10 mg 10 mg 10 mg 10 mg 10 mg 10 mg 10 mg    (5 mg2) (5 mg2) (5 mg2) (5 mg2) (5 mg2) (5 mg2) (5 mg2)         ANTICOAG SUMMARY: Anticoagulation Episode Summary              Current INR goal 2.0-3.0 Next INR check 09/02/2011   INR from last check 3.7! (08/05/2011)     Weekly max dose (mg)  Target end date 03/23/2011   Indications PULMONARY EMBOLISM, Long term current use of anticoagulant   INR check location [facility]erred lab    Send INR reminders to [facility]   Comments        Provider Role Specialty Phone number   Larey Dresser  Internal Medicine [phone]        ANTICOAG TODAY: Anticoagulation Summary as of 08/05/2011              INR goal 2.0-3.0     Selected INR 3.7! (08/05/2011) Next INR check 09/02/2011   Weekly max dose (mg)  Target end date 03/23/2011   Indications PULMONARY EMBOLISM, Long term current use of anticoagulant    Anticoagulation Episode Summary              INR check location [facility] Preferred lab    Send INR reminders to [facility]   Comments        Provider Role Specialty Phone number   Larey Dresser  Internal Medicine [phone]        PATIENT INSTRUCTIONS: Patient Instructions  Patient instructed to take medications as defined in the Anti-coagulation Track section of this encounter.  Patient instructed to take today's dose.  Patient verbalized understanding of these instructions.        FOLLOW-UP Return in 4 weeks (on 09/02/2011) for Follow up INR.  Jorene Guest, III Pharm.D., CACP

## 2011-08-05 NOTE — Patient Instructions (Signed)
Patient instructed to take medications as defined in the Anti-coagulation Track section of this encounter.  Patient instructed to take today's dose.  Patient verbalized understanding of these instructions.    

## 2011-08-08 ENCOUNTER — Inpatient Hospital Stay (HOSPITAL_COMMUNITY)
Admission: EM | Admit: 2011-08-08 | Discharge: 2011-08-13 | DRG: 603 | Disposition: A | Discharge status: Home / Self Care | Payer: Self-pay | Attending: Internal Medicine | Admitting: Internal Medicine

## 2011-08-08 DIAGNOSIS — L02419 Cutaneous abscess of limb, unspecified: Principal | ICD-10-CM | POA: Diagnosis present

## 2011-08-08 DIAGNOSIS — Z86718 Personal history of other venous thrombosis and embolism: Secondary | ICD-10-CM

## 2011-08-08 DIAGNOSIS — L03317 Cellulitis of buttock: Secondary | ICD-10-CM | POA: Diagnosis present

## 2011-08-08 DIAGNOSIS — N179 Acute kidney failure, unspecified: Secondary | ICD-10-CM

## 2011-08-08 DIAGNOSIS — Z7901 Long term (current) use of anticoagulants: Secondary | ICD-10-CM

## 2011-08-08 DIAGNOSIS — N184 Chronic kidney disease, stage 4 (severe): Secondary | ICD-10-CM | POA: Diagnosis present

## 2011-08-08 DIAGNOSIS — Z86711 Personal history of pulmonary embolism: Secondary | ICD-10-CM

## 2011-08-08 DIAGNOSIS — L0231 Cutaneous abscess of buttock: Secondary | ICD-10-CM | POA: Diagnosis present

## 2011-08-08 DIAGNOSIS — Q613 Polycystic kidney, unspecified: Secondary | ICD-10-CM

## 2011-08-08 DIAGNOSIS — E875 Hyperkalemia: Secondary | ICD-10-CM | POA: Diagnosis present

## 2011-08-08 DIAGNOSIS — Z21 Asymptomatic human immunodeficiency virus [HIV] infection status: Secondary | ICD-10-CM | POA: Diagnosis present

## 2011-08-08 DIAGNOSIS — I129 Hypertensive chronic kidney disease with stage 1 through stage 4 chronic kidney disease, or unspecified chronic kidney disease: Secondary | ICD-10-CM | POA: Diagnosis present

## 2011-08-08 DIAGNOSIS — D631 Anemia in chronic kidney disease: Secondary | ICD-10-CM | POA: Diagnosis present

## 2011-08-08 DIAGNOSIS — L03119 Cellulitis of unspecified part of limb: Secondary | ICD-10-CM

## 2011-08-08 DIAGNOSIS — N189 Chronic kidney disease, unspecified: Secondary | ICD-10-CM

## 2011-08-08 LAB — BASIC METABOLIC PANEL
Calcium: 9 mg/dL (ref 8.4–10.5)
GFR calc Af Amer: 24 mL/min — ABNORMAL LOW (ref >60)
GFR calc non Af Amer: 20 mL/min — ABNORMAL LOW (ref >60)
Potassium: 5.7 mEq/L — ABNORMAL HIGH (ref 3.5–5.1)
Sodium: 135 mEq/L (ref 135–145)

## 2011-08-08 LAB — DIFFERENTIAL
Basophils Absolute: 0 10*3/uL (ref 0.0–0.1)
Basophils Relative: 0 % (ref 0–1)
Eosinophils Absolute: 0.2 10*3/uL (ref 0.0–0.7)
Monocytes Absolute: 0.7 10*3/uL (ref 0.1–1.0)
Monocytes Relative: 7 % (ref 3–12)
Neutro Abs: 6.9 10*3/uL (ref 1.7–7.7)

## 2011-08-08 LAB — CBC
MCH: 32.7 pg (ref 26.0–34.0)
MCHC: 34.6 g/dL (ref 30.0–36.0)
Platelets: 206 10*3/uL (ref 150–400)
RDW: 12.2 % (ref 11.5–15.5)

## 2011-08-08 LAB — APTT: aPTT: 86 seconds — ABNORMAL HIGH (ref 24–37)

## 2011-08-08 LAB — PROTIME-INR
INR: 2.91 — ABNORMAL HIGH (ref 0.00–1.49)
Prothrombin Time: 30.9 seconds — ABNORMAL HIGH (ref 11.6–15.2)

## 2011-08-09 ENCOUNTER — Encounter: Payer: Self-pay | Admitting: Ophthalmology

## 2011-08-09 LAB — COMPREHENSIVE METABOLIC PANEL
Albumin: 3 g/dL — ABNORMAL LOW (ref 3.5–5.2)
Alkaline Phosphatase: 71 U/L (ref 39–117)
BUN: 38 mg/dL — ABNORMAL HIGH (ref 6–23)
CO2: 19 mEq/L (ref 19–32)
Chloride: 111 mEq/L (ref 96–112)
GFR calc non Af Amer: 21 mL/min — ABNORMAL LOW (ref >60)
Potassium: 5.3 mEq/L — ABNORMAL HIGH (ref 3.5–5.1)
Total Bilirubin: 0.1 mg/dL — ABNORMAL LOW (ref 0.3–1.2)

## 2011-08-09 LAB — CBC
HCT: 28.4 % — ABNORMAL LOW (ref 39.0–52.0)
Hemoglobin: 9.6 g/dL — ABNORMAL LOW (ref 13.0–17.0)
MCH: 32.1 pg (ref 26.0–34.0)
MCV: 95 fL (ref 78.0–100.0)
RBC: 2.99 MIL/uL — ABNORMAL LOW (ref 4.22–5.81)

## 2011-08-09 LAB — PROTIME-INR: Prothrombin Time: 28.2 seconds — ABNORMAL HIGH (ref 11.6–15.2)

## 2011-08-09 LAB — CARDIAC PANEL(CRET KIN+CKTOT+MB+TROPI): Total CK: 79 U/L (ref 7–232)

## 2011-08-09 LAB — CK TOTAL AND CKMB (NOT AT ARMC): Relative Index: INVALID (ref 0.0–2.5)

## 2011-08-09 LAB — MRSA PCR SCREENING: MRSA by PCR: POSITIVE — AB
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
## 2011-08-10 LAB — CBC
HCT: 28.9 % — ABNORMAL LOW (ref 39.0–52.0)
Hemoglobin: 9.6 g/dL — ABNORMAL LOW (ref 13.0–17.0)
MCV: 95.7 fL (ref 78.0–100.0)
WBC: 5.8 10*3/uL (ref 4.0–10.5)

## 2011-08-10 LAB — BASIC METABOLIC PANEL
BUN: 35 mg/dL — ABNORMAL HIGH (ref 6–23)
Chloride: 105 mEq/L (ref 96–112)
Glucose, Bld: 91 mg/dL (ref 70–99)
Potassium: 5.4 mEq/L — ABNORMAL HIGH (ref 3.5–5.1)

## 2011-08-11 LAB — BASIC METABOLIC PANEL
CO2: 21 mEq/L (ref 19–32)
Calcium: 8 mg/dL — ABNORMAL LOW (ref 8.4–10.5)
Chloride: 104 mEq/L (ref 96–112)
Potassium: 4.4 mEq/L (ref 3.5–5.1)
Sodium: 134 mEq/L — ABNORMAL LOW (ref 135–145)

## 2011-08-11 LAB — PROTIME-INR: INR: 2.18 — ABNORMAL HIGH (ref 0.00–1.49)

## 2011-08-12 DIAGNOSIS — L03119 Cellulitis of unspecified part of limb: Secondary | ICD-10-CM

## 2011-08-12 DIAGNOSIS — N179 Acute kidney failure, unspecified: Secondary | ICD-10-CM

## 2011-08-12 DIAGNOSIS — N189 Chronic kidney disease, unspecified: Secondary | ICD-10-CM

## 2011-08-12 DIAGNOSIS — L02419 Cutaneous abscess of limb, unspecified: Secondary | ICD-10-CM

## 2011-08-12 LAB — PROTIME-INR
INR: 2.07 — ABNORMAL HIGH (ref 0.00–1.49)
Prothrombin Time: 23.7 seconds — ABNORMAL HIGH (ref 11.6–15.2)

## 2011-08-13 LAB — PROTIME-INR
INR: 1.92 — ABNORMAL HIGH (ref 0.00–1.49)
Prothrombin Time: 22.3 seconds — ABNORMAL HIGH (ref 11.6–15.2)

## 2011-08-15 LAB — CULTURE, BLOOD (ROUTINE X 2)
Culture  Setup Time: 201208240130
Culture: NO GROWTH

## 2011-08-16 NOTE — Discharge Summary (Signed)
NAME:  Brian [name], Brian [name]                ACCOUNT NO.:  [id number]  MEDICAL RECORD NO.:  [id number]  LOCATION:  [id number]                         FACILITY:  [facility]  PHYSICIAN:  C. Milta Deiters, M.D.DATE OF BIRTH:  [birth year]  DATE OF ADMISSION:  08/08/2011 DATE OF DISCHARGE:  08/13/2011                              DISCHARGE SUMMARY   DISCHARGE DIAGNOSES:  Are as follows: 1. Left lower extremity cellulitis. 2. History of multiple MRSA, culture positive cellulitis. 3. Human immunodeficiency virus with a CD4 count of 660 in July 2012. 4. 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 5. Anemia of chronic disease secondary to chronic kidney disease,     baseline hemoglobin around 9. 6. Chronic hyperkalemia secondary to chronic kidney disease and     treatment with lisinopril, baseline potassium between 5.3 and 5.9. 7. Homelessness, expecting voucher from [employer].  Discharge medications are as follows: 1. Clindamycin 300 mg four times daily for 5 days. 2. Vicodin 5/325 one to two tablets by mouth every 6 hours as needed     for acute lower extremity pain for 5 days. 3. Lisinopril 40 mg 1 tablet by mouth daily at bedtime. 4. Sustiva 600 mg 1 tablet by mouth daily at bedtime. 5. Truvada 1 tablet by mouth every Monday, Wednesday, and Friday. 6. Vicodin 5/500 one tablet by mouth twice daily as needed for chronic     knee pain. 7. Warfarin 5 mg 2 tablets by mouth every evening.  DISPOSITION AND FOLLOWUP:  The patient is medically stable for safe discharge to temporary housing found by Social Work at [facility].  He is scheduled to receive a voucher from [facility] on Wednesday, August 14, 2011, for his on permanent housing.  He will follow up with Dr. Victorio Palm on September 02, 2011, at [facility].  He also reports a scheduled followup with Dr. Moshe Cipro, his  nephrologist, sometime at the beginning of September.  Brief admitting H and P and labs is as follows: Chief complaint:  Painful swollen right leg.  The patient is a 35 year old male with history of MRSA cellulitis in March of 2012, as well as recurrent homelessness with HIV last CD4 count of 660, who presents with a 2-day history of redness, swelling, warmth, and tenderness of the skin surrounding the sites of 2 boils on his right leg that appeared 4 days prior to admission.  He reports that he gets boils approximately once every week and he usually squeezes them, and lances them with a toothpick after washing his hands.  He did attempt to drain these boils, but was not able to.  He denies any cuts or scrapes to the area.  He noted white fluid in the larger of the two boils.  He reports chills and intense pain, tightness of his right leg with standing and weightbearing.  He reports no problems with compliance with HIV medications.  On arrival to the ED, his boils were incised and drained.  PHYSICAL EXAMINATION:  VITAL SIGNS:  At admission, temperature 97.5, heart rate 87, blood pressure 128/77, respiratory rate 18, O2  saturation 99% on room air. GENERAL:  A tall, long-haired man resting in bed in some distress. HEENT:  Pupils equal, round, and reactive to light.  Extraocular muscles intact.  No scleral icterus. CARDIAC:  Regular rate and rhythm.  No rubs, murmurs, or gallops. PULMONARY:  Clear to auscultation bilaterally, and moving normal volumes of air. ABDOMEN:  Soft, nontender, nondistended.  Bowel sounds present. EXTREMITIES:  Warm and well perfused.  No pedal edema, large area of erythema and induration and warmth surrounding central lance pustule on his right inner calf that extends posteriorly also extends to anterior shin, moderate serosanguineous drainage present on that sheath, smaller area of induration, rubor, calor, or surrounding pustule on the right inner thigh with  the moderate odor. NEUROLOGIC:  Alert and oriented x3.  Cranial nerves II to XII grossly intact.  White blood count 9.8, hemoglobin 10.4, hematocrit 30.1, MCV 94.7, platelets 206.  Sodium 135, potassium 5.7, chloride 105, CO2 of 21, BUN 43, creatinine 3.52, glucose 126, calcium 9, INR 2.91.  HOSPITAL COURSE:  As follows: 1. The patient has a history of recurrent cellulitis, this episode     began with several carbuncles along patient's right lower     extremity, his left buttocks, and on upper extremities.  The     carbuncles along his right calf worsened and were lanced in the     emergency department.  The skin surrounding these carbuncles was     erythematous, warm, and tender to touch.  The patient described     significant 8/10 throbbing pain with walking and 6-7/10 pain at     rest during hospitalization.  He was treated with p.r.n. IV     morphine and p.o. Vicodin.  To note, patient takes 2 tabs Vicodin     daily for chronic knee pain, unfortunately cultures were not     obtained in the ED, but he remained afebrile during     hospitalization.  He was treated with 5 days of renally dosed IV     vancomycin and discharged with a 5-day course of p.o. clindamycin.     I also discharged him with a 5-day supply of Vicodin for his acute     pain secondary to cellulitis.  Wound Care was consulted and noted     that he will need to pack his wounds until drainage stops, and on     the day of discharge drainage was still significant, [facility] will provide home health to teach him how to pack his wound. 2. Hyperkalemia.  The patient's potassium was elevated at admission,     but he was within his baseline range.  His hyperkalemia is likely     secondary to chronic kidney disease as well as secondary to     treatment with lisinopril.  Lisinopril was indicated given his     polycystic kidney disease and he has close followup with his     nephrologist Dr. Moshe Cipro, given that  his EKG was significant     for mildly peak T-waves.  The patient was treated with Kayexalate     until potassium level was normalized.  He denied symptoms of chest     pain and palpitations and there were no acute issues during     hospitalization.  He was counseled on a low-potassium diet prior to     discharge. 3. Stage IV chronic kidney disease.  He is being followed by Dr.  Moshe Cipro of [facility].  His creatinine has     been around 3 for the last 5 months as per [facility] records.  As noted     above, his chronic kidney disease is secondary to polycystic kidney     disease. 4. HIV.  The patient's last CD4 count in July 2012 was 660.  He is     being followed by Dr. Magdalen Spatz and reports his next follow up     with him is in about 3 months.  He reports compliance with HIV     medications.  There were no acute issues during hospitalization. 5. Given patient's history of polysubstance abuse, he was placed on     CIWA protocol during hospitalization.  He did not exhibit any     significant signs of withdrawal. 6. History of PE and DVT.  The patient has been treated with Coumadin     and is monitored by Dr. Johney Maine, outpatient.  As per [facility] records, I     believe that he is to be continued on Coumadin, so he was     discharged with his outpatient dose.  Vitals at discharge are as follows; temperature 97.4, pulse 79, respirations 20, blood pressure 117/77, saturating 96% on room air.  Discharge labs are as follows; INR 1.92, sodium 134, potassium 4.4, chloride 104, CO2 of 21, glucose 105, BUN 38, creatinine 3.22, calcium 8.  Blood cultures no growth to date x2.  His white blood count 5.8, hemoglobin 9.6, hematocrit 28.9, MCV 95.7, platelet count 155, vancomycin trough 39.4.  Random vancomycin level 43.2.    ______________________________ Pryor Ochoa, MD   ______________________________ C. Milta Deiters, M.D.    NK/MEDQ  D:  08/13/2011  T:  08/14/2011   Job:  [id number]  cc:   Louis Meckel, M.D. Marlow Baars, NP  Electronically Signed by Pryor Ochoa MD on 08/14/2011 12:27:21 PM Electronically Signed by Genelle Gather M.D. on 08/16/2011 05:11:36 PM

## 2011-09-02 ENCOUNTER — Ambulatory Visit (INDEPENDENT_AMBULATORY_CARE_PROVIDER_SITE_OTHER): Payer: Self-pay | Admitting: Pharmacist

## 2011-09-02 ENCOUNTER — Encounter: Payer: Self-pay | Admitting: Internal Medicine

## 2011-09-02 ENCOUNTER — Ambulatory Visit (INDEPENDENT_AMBULATORY_CARE_PROVIDER_SITE_OTHER): Payer: Self-pay | Admitting: Internal Medicine

## 2011-09-02 VITALS — BP 130/84 | HR 71 | Temp 97.5°F | Ht 75.0 in | Wt 200.0 lb

## 2011-09-02 DIAGNOSIS — Z23 Encounter for immunization: Secondary | ICD-10-CM

## 2011-09-02 DIAGNOSIS — I2699 Other pulmonary embolism without acute cor pulmonale: Secondary | ICD-10-CM

## 2011-09-02 DIAGNOSIS — L03119 Cellulitis of unspecified part of limb: Secondary | ICD-10-CM

## 2011-09-02 DIAGNOSIS — F17200 Nicotine dependence, unspecified, uncomplicated: Secondary | ICD-10-CM

## 2011-09-02 DIAGNOSIS — Z7901 Long term (current) use of anticoagulants: Secondary | ICD-10-CM

## 2011-09-02 DIAGNOSIS — L03115 Cellulitis of right lower limb: Secondary | ICD-10-CM

## 2011-09-02 LAB — POCT INR: INR: 2.3

## 2011-09-02 MED ORDER — INFLUENZA VIRUS VACCINE SPLIT IM INJ: 0.5000 mL | INJECTION | Freq: Once | INTRAMUSCULAR | Status: DC

## 2011-09-02 NOTE — Patient Instructions (Signed)
Patient instructed to take medications as defined in the Anti-coagulation Track section of this encounter.  Patient instructed to take today's dose.  Patient verbalized understanding of these instructions.    

## 2011-09-02 NOTE — Progress Notes (Signed)
Anti-Coagulation Progress Note  Brian [name] is a 35 y.o. male who is currently on an anti-coagulation regimen.    RECENT RESULTS: Recent results are below, the most recent result is correlated with a dose of 70 mg. per week: Lab Results  Component Value Date   INR 2.30 09/02/2011   INR 1.92* 08/13/2011   INR 2.07* 08/12/2011    ANTI-COAG DOSE:   Latest dosing instructions   Total Sun Mon Tue Wed Thu Fri Sat   70 10 mg 10 mg 10 mg 10 mg 10 mg 10 mg 10 mg    (5 mg2) (5 mg2) (5 mg2) (5 mg2) (5 mg2) (5 mg2) (5 mg2)         ANTICOAG SUMMARY: Anticoagulation Episode Summary              Current INR goal 2.0-3.0 Next INR check 09/30/2011   INR from last check 2.30 (09/02/2011)     Weekly max dose (mg)  Target end date 03/23/2011   Indications PULMONARY EMBOLISM, Long term current use of anticoagulant   INR check location [facility] Preferred lab    Send INR reminders to [facility]   Comments        Provider Role Specialty Phone number   Larey Dresser  Internal Medicine [phone]        ANTICOAG TODAY: Anticoagulation Summary as of 09/02/2011              INR goal 2.0-3.0     Selected INR 2.30 (09/02/2011) Next INR check 09/30/2011   Weekly max dose (mg)  Target end date 03/23/2011   Indications PULMONARY EMBOLISM, Long term current use of anticoagulant    Anticoagulation Episode Summary              INR check location [facility] Preferred lab    Send INR reminders to ANTICOAG IMP   Comments        Provider Role Specialty Phone number   Larey Dresser  Internal Medicine [phone]        PATIENT INSTRUCTIONS: Patient Instructions  Patient instructed to take medications as defined in the Anti-coagulation Track section of this encounter.  Patient instructed to take today's dose.  Patient verbalized understanding of these instructions.        FOLLOW-UP Return in 4 weeks (on 09/30/2011) for Follow up INR.  Jorene Guest, III Pharm.D.,  CACP
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
## 2011-09-02 NOTE — Assessment & Plan Note (Signed)
Patient is motivated to quit smoking. We discussed the idea of picking a quit date once he is ready.  He currently smokes 0.5 PPD.    Plan: Refer to social work for smoking cessation.  He notes he has quit for 1 month before with help of the patch.  He used the patch for the entire month, until he completed his supply, and started smoking again 2 days after supply complete.  He notes that the price of the patch is a constraint.
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
## 2011-09-02 NOTE — Patient Instructions (Signed)
-  Congratulations on your decision to quit smoking!!  The first step is making the decision, the next is picking you stop date!  I will refer you to our smoking cessation nurse to help with the process.  -Continue coumadin and be sure to follow up in our coumadin clinic.  -Follow up in November to meet your new primary care doctor!  In the meanwhile, be sure to follow up with your nephrologist and your ID doctor.  Thanks!

## 2011-09-02 NOTE — Assessment & Plan Note (Signed)
Patient was hospitalized in august for cellulitis of his right lower extremity.  He was discharged with 5 days of clindamycin, which he completed, as well as a few days of vicodin in addition to his current regimen, for which he has a pain contract.  Home health also helped with packing 2 of his wounds s/p I&D of 2 carbuncles.  Plan: resolved. No pain. No signs or symptoms of cellulitis.

## 2011-09-02 NOTE — Progress Notes (Signed)
Dr Victorio Palm discussed Mr Guardino's care with me and I agree with her note, assessment, and plan. The overview for anticoagulation on the problem list has been updated to reflect Dr Girtha Rm discussion of the risk / benefits of continuing coumadin.

## 2011-09-05 ENCOUNTER — Telehealth: Payer: Self-pay | Admitting: Licensed Clinical Social Worker

## 2011-09-05 NOTE — Telephone Encounter (Signed)
Spoke with Brian [name] about quitting smoking.  We will meet on Oct 15th after his Coumadin Clinic appmt at around 10:00 AM.  Also discussed group classes at [facility] which starts on 10/23 and runs until 12/4.  I will provide that information to Brian [name] when I meet with him.

## 2011-09-06 ENCOUNTER — Other Ambulatory Visit: Payer: Self-pay | Admitting: Licensed Clinical Social Worker

## 2011-09-06 DIAGNOSIS — B20 Human immunodeficiency virus [HIV] disease: Secondary | ICD-10-CM

## 2011-09-06 MED ORDER — EFAVIRENZ 600 MG PO TABS: 600.0000 mg | ORAL_TABLET | Freq: Every day | ORAL | Status: DC

## 2011-09-24 LAB — ETHANOL: Alcohol, Ethyl (B): 57 — ABNORMAL HIGH

## 2011-09-30 ENCOUNTER — Ambulatory Visit: Payer: Self-pay

## 2011-09-30 ENCOUNTER — Ambulatory Visit: Payer: Self-pay | Admitting: Licensed Clinical Social Worker

## 2011-09-30 NOTE — Telephone Encounter (Signed)
[facility] for coumadin appmt and also smoking cessation appmt.  Sending quitline info and group class info to home address.

## 2011-10-09 ENCOUNTER — Telehealth: Payer: Self-pay | Admitting: Internal Medicine

## 2011-10-09 ENCOUNTER — Other Ambulatory Visit: Payer: Self-pay | Admitting: Internal Medicine

## 2011-10-09 ENCOUNTER — Other Ambulatory Visit: Payer: Self-pay

## 2011-10-09 DIAGNOSIS — G8929 Other chronic pain: Secondary | ICD-10-CM

## 2011-10-09 NOTE — Telephone Encounter (Signed)
Last UDS inappropriate. Pls have pt come in to pick up script and get a UDS. Order placed. Pls ask him when his dose was and document. Thanks

## 2011-10-09 NOTE — Telephone Encounter (Signed)
Pt here to pick up prescription for Vicodin and to do UDS.  Pt said that he last took a Vicodin 4 days ago.  Sander Nephew, RN 10/09/2011 3:49 PM

## 2011-10-12 LAB — PRESCRIPTION ABUSE MONITORING 15P, URINE
Amphetamine/Meth: NEGATIVE NG/ML
Barbiturate Screen, Urine: NEGATIVE NG/ML
Benzodiazepine Screen, Urine: NEGATIVE NG/ML
Buprenorphine, Urine: NEGATIVE NG/ML
Cocaine Metabolites: NEGATIVE NG/ML
Creatinine, Urine: 78.1 MG/DL
Fentanyl, Ur: NEGATIVE NG/ML
Meperidine, Ur: NEGATIVE NG/ML
Methadone Screen, Urine: NEGATIVE NG/ML
Propoxyphene: NEGATIVE NG/ML
Tramadol Scrn, Ur: NEGATIVE NG/ML
Zolpidem, Urine: NEGATIVE NG/ML

## 2011-10-12 LAB — CANNABANOIDS (GC/LC/MS), URINE: THC-COOH (GC/LC/MS), ur confirm: NEGATIVE NG/ML

## 2011-10-14 ENCOUNTER — Ambulatory Visit (INDEPENDENT_AMBULATORY_CARE_PROVIDER_SITE_OTHER): Payer: Self-pay | Admitting: Pharmacist

## 2011-10-14 ENCOUNTER — Other Ambulatory Visit: Payer: Self-pay

## 2011-10-14 DIAGNOSIS — Z7901 Long term (current) use of anticoagulants: Secondary | ICD-10-CM

## 2011-10-14 DIAGNOSIS — N185 Chronic kidney disease, stage 5: Secondary | ICD-10-CM

## 2011-10-14 DIAGNOSIS — N184 Chronic kidney disease, stage 4 (severe): Secondary | ICD-10-CM

## 2011-10-14 DIAGNOSIS — Z86718 Personal history of other venous thrombosis and embolism: Secondary | ICD-10-CM | POA: Insufficient documentation

## 2011-10-14 DIAGNOSIS — I2699 Other pulmonary embolism without acute cor pulmonale: Secondary | ICD-10-CM

## 2011-10-14 DIAGNOSIS — D649 Anemia, unspecified: Secondary | ICD-10-CM

## 2011-10-14 LAB — IRON AND TIBC
Iron: 41 ug/dL — ABNORMAL LOW (ref 42–165)
UIBC: 216 ug/dL (ref 125–400)

## 2011-10-14 LAB — RENAL FUNCTION PANEL
BUN: 38 mg/dL — ABNORMAL HIGH (ref 6–23)
Calcium: 8.4 mg/dL (ref 8.4–10.5)
Creat: 3.03 mg/dL — ABNORMAL HIGH (ref 0.50–1.35)
Glucose, Bld: 73 mg/dL (ref 70–99)
Phosphorus: 3 mg/dL (ref 2.3–4.6)

## 2011-10-14 LAB — CBC
HCT: 34.4 % — ABNORMAL LOW (ref 39.0–52.0)
Hemoglobin: 11.1 g/dL — ABNORMAL LOW (ref 13.0–17.0)
MCH: 31.4 pg (ref 26.0–34.0)
MCHC: 32.3 g/dL (ref 30.0–36.0)
RDW: 13.2 % (ref 11.5–15.5)

## 2011-10-14 LAB — POCT INR: INR: 2.7

## 2011-10-14 NOTE — Progress Notes (Signed)
Anti-Coagulation Progress Note  Brian [name] is a 35 y.o. male who is currently on an anti-coagulation regimen.    RECENT RESULTS: Recent results are below, the most recent result is correlated with a dose of 70 mg. per week: Lab Results  Component Value Date   INR 2.7 10/14/2011   INR 2.30 09/02/2011   INR 1.92* 08/13/2011    ANTI-COAG DOSE:   Latest dosing instructions   Total Sun Mon Tue Wed Thu Fri Sat   70 10 mg 10 mg 10 mg 10 mg 10 mg 10 mg 10 mg    (5 mg2) (5 mg2) (5 mg2) (5 mg2) (5 mg2) (5 mg2) (5 mg2)         ANTICOAG SUMMARY: Anticoagulation Episode Summary              Current INR goal 2.0-3.0 Next INR check 11/11/2011   INR from last check 2.7 (10/14/2011)     Weekly max dose (mg)  Target end date Indefinite   Indications Long term current use of anticoagulant, PULMONARY EMBOLISM, History of DVT (deep vein thrombosis)   INR check location [facility] Preferred lab    Send INR reminders to ANTICOAG IMP   Comments Has had two episodes of VTE (1 index VTE and 1 subsequent bilateral PE). Indefinite therapy suggested.       Provider Role Specialty Phone number   Larey Dresser  Internal Medicine [phone]        ANTICOAG TODAY: Anticoagulation Summary as of 10/14/2011              INR goal 2.0-3.0     Selected INR 2.7 (10/14/2011) Next INR check 11/11/2011   Weekly max dose (mg)  Target end date Indefinite   Indications Long term current use of anticoagulant, PULMONARY EMBOLISM, History of DVT (deep vein thrombosis)    Anticoagulation Episode Summary              INR check location [facility] Preferred lab    Send INR reminders to ANTICOAG IMP   Comments Has had two episodes of VTE (1 index VTE and 1 subsequent bilateral PE). Indefinite therapy suggested.       Provider Role Specialty Phone number   Larey Dresser  Internal Medicine [phone]        PATIENT INSTRUCTIONS: Patient Instructions  Patient instructed to take  medications as defined in the Anti-coagulation Track section of this encounter.  Patient instructed to take today's dose.  Patient verbalized understanding of these instructions.        FOLLOW-UP Return in 4 weeks (on 11/11/2011) for [place] up INR.  Jorene Guest, III Pharm.D., CACP

## 2011-10-14 NOTE — Patient Instructions (Signed)
Patient instructed to take medications as defined in the Anti-coagulation Track section of this encounter.  Patient instructed to take today's dose.  Patient verbalized understanding of these instructions.    

## 2011-10-15 LAB — PTH, INTACT AND CALCIUM
Calcium, Total (PTH): 8.4 mg/dL (ref 8.4–10.5)
PTH: 198.9 pg/mL — ABNORMAL HIGH (ref 14.0–72.0)

## 2011-10-28 ENCOUNTER — Encounter: Payer: Self-pay | Admitting: Ophthalmology

## 2011-11-11 ENCOUNTER — Ambulatory Visit: Payer: Self-pay

## 2011-11-11 ENCOUNTER — Encounter: Payer: Self-pay | Admitting: Ophthalmology

## 2011-11-15 ENCOUNTER — Other Ambulatory Visit: Payer: Self-pay

## 2011-11-15 ENCOUNTER — Encounter: Payer: Self-pay | Admitting: Vascular Surgery

## 2011-11-15 DIAGNOSIS — Z01810 Encounter for preprocedural cardiovascular examination: Secondary | ICD-10-CM

## 2011-11-15 DIAGNOSIS — N184 Chronic kidney disease, stage 4 (severe): Secondary | ICD-10-CM

## 2011-11-16 ENCOUNTER — Other Ambulatory Visit: Payer: Self-pay | Admitting: Internal Medicine

## 2011-11-21 ENCOUNTER — Other Ambulatory Visit: Payer: Self-pay | Admitting: Infectious Diseases

## 2011-11-21 DIAGNOSIS — Z113 Encounter for screening for infections with a predominantly sexual mode of transmission: Secondary | ICD-10-CM

## 2011-11-21 DIAGNOSIS — B20 Human immunodeficiency virus [HIV] disease: Secondary | ICD-10-CM

## 2011-11-21 NOTE — Telephone Encounter (Signed)
Last UDS negative for opiates.  5 missed appointments since.  No controlled substance refills until next app't.

## 2011-11-25 ENCOUNTER — Other Ambulatory Visit: Payer: Self-pay

## 2011-11-25 ENCOUNTER — Other Ambulatory Visit: Payer: Self-pay | Admitting: Infectious Diseases

## 2011-11-25 DIAGNOSIS — B20 Human immunodeficiency virus [HIV] disease: Secondary | ICD-10-CM

## 2011-11-25 DIAGNOSIS — Z113 Encounter for screening for infections with a predominantly sexual mode of transmission: Secondary | ICD-10-CM

## 2011-11-25 LAB — COMPREHENSIVE METABOLIC PANEL
ALT: 29 U/L (ref 0–53)
AST: 32 U/L (ref 0–37)
Albumin: 4.2 g/dL (ref 3.5–5.2)
CO2: 21 mEq/L (ref 19–32)
Calcium: 8.6 mg/dL (ref 8.4–10.5)
Chloride: 105 mEq/L (ref 96–112)
Potassium: 5.6 mEq/L — ABNORMAL HIGH (ref 3.5–5.3)
Sodium: 137 mEq/L (ref 135–145)
Total Protein: 7.4 g/dL (ref 6.0–8.3)

## 2011-11-25 LAB — CBC WITH DIFFERENTIAL/PLATELET
Basophils Absolute: 0 10*3/uL (ref 0.0–0.1)
Eosinophils Relative: 3 % (ref 0–5)
Lymphocytes Relative: 22 % (ref 12–46)
Lymphs Abs: 1.8 10*3/uL (ref 0.7–4.0)
MCV: 95.1 fL (ref 78.0–100.0)
Neutro Abs: 5.5 10*3/uL (ref 1.7–7.7)
Neutrophils Relative %: 68 % (ref 43–77)
Platelets: 180 10*3/uL (ref 150–400)
RBC: 3.84 MIL/uL — ABNORMAL LOW (ref 4.22–5.81)
RDW: 12.8 % (ref 11.5–15.5)
WBC: 8.1 10*3/uL (ref 4.0–10.5)

## 2011-11-26 ENCOUNTER — Other Ambulatory Visit: Payer: Self-pay | Admitting: Infectious Diseases

## 2011-11-26 DIAGNOSIS — E875 Hyperkalemia: Secondary | ICD-10-CM

## 2011-11-26 LAB — T-HELPER CELL (CD4) - (RCID CLINIC ONLY): CD4 T Cell Abs: 840 uL (ref 400–2700)

## 2011-11-27 LAB — HIV-1 RNA QUANT-NO REFLEX-BLD: HIV 1 RNA Quant: <20 copies/mL (ref <20)

## 2011-11-29 ENCOUNTER — Other Ambulatory Visit: Payer: Self-pay

## 2011-11-29 ENCOUNTER — Telehealth: Payer: Self-pay | Admitting: *Deleted

## 2011-11-29 NOTE — Telephone Encounter (Signed)
Message copied by Georgena Spurling on Fri Nov 29, 2011  9:05 AM ------      Message from: HATCHER, JEFFREY C      Created: Thu Nov 28, 2011  5:04 PM      Regarding: K+       Brian [name] needs a repeat K+. thanks

## 2011-12-03 ENCOUNTER — Ambulatory Visit (INDEPENDENT_AMBULATORY_CARE_PROVIDER_SITE_OTHER): Payer: Self-pay | Admitting: Infectious Diseases

## 2011-12-03 ENCOUNTER — Encounter: Payer: Self-pay | Admitting: Infectious Diseases

## 2011-12-03 VITALS — BP 145/92 | HR 81 | Temp 97.8°F | Ht 75.0 in | Wt 205.0 lb

## 2011-12-03 DIAGNOSIS — Z113 Encounter for screening for infections with a predominantly sexual mode of transmission: Secondary | ICD-10-CM

## 2011-12-03 DIAGNOSIS — Z23 Encounter for immunization: Secondary | ICD-10-CM

## 2011-12-03 DIAGNOSIS — N184 Chronic kidney disease, stage 4 (severe): Secondary | ICD-10-CM

## 2011-12-03 DIAGNOSIS — R197 Diarrhea, unspecified: Secondary | ICD-10-CM | POA: Insufficient documentation

## 2011-12-03 DIAGNOSIS — F17200 Nicotine dependence, unspecified, uncomplicated: Secondary | ICD-10-CM

## 2011-12-03 DIAGNOSIS — K029 Dental caries, unspecified: Secondary | ICD-10-CM

## 2011-12-03 DIAGNOSIS — Z79899 Other long term (current) drug therapy: Secondary | ICD-10-CM

## 2011-12-03 DIAGNOSIS — B20 Human immunodeficiency virus [HIV] disease: Secondary | ICD-10-CM

## 2011-12-03 DIAGNOSIS — E875 Hyperkalemia: Secondary | ICD-10-CM

## 2011-12-03 MED ORDER — DIPHENOXYLATE-ATROPINE 2.5-0.025 MG PO TABS: 1.0000 | ORAL_TABLET | Freq: Two times a day (BID) | ORAL | Status: DC | PRN

## 2011-12-03 NOTE — Progress Notes (Signed)
Addended by: Dolan Amen D on: 12/03/2011 04:13 PM   Modules accepted: Orders

## 2011-12-03 NOTE — Assessment & Plan Note (Signed)
Will repeat his K+ today (previously elevated). Will ask Nephrology to consider alternate to ACE-I (as potential cause of diarrhea and increased K+)? My great appreciation to their partnership.

## 2011-12-03 NOTE — Assessment & Plan Note (Signed)
He is doing very well, CD4 is up, VL undetectable. He has gotten flu. Will get 2nd (late) hep A today. Offered condoms, refused. Will get him into our dental clinic. Will send him rx for diarrhea. See him back in 4-5 months with lab prior. His TRV is dosed appropriate for his Cr (every 72h), will watch.

## 2011-12-03 NOTE — Progress Notes (Signed)
  Subjective:    Patient ID: Brian [name], male    DOB: 10/19/1976, 35 y.o.   MRN: [id number]  HPI 35 yo M with hx of HIV+ since 2010, prev DVT and PE, on coumadin, ESRD (CrCl 22).  CD4 840, VL <20 (11-25-11). Taking EFV/TRV.  Has occasionally had bad diarrhea, for last 7-8 months. Relates this to starting TRV (MWF)/EFV (daily). Also changed to lisinopril at that same time- missed some doses and stool became more regular. Wt steady (210-198). Eating well. Last nephro eval 1 week ago (getting vein mapping done in preparation for HD).   Review of Systems  Respiratory: Negative for chest tightness and shortness of breath.        Has chest pain when he smokes too many cigarrettes. Has trouble with breathing at night when his sinuses get full.   Neurological: Negative for headaches.       Objective:   Physical Exam  Constitutional: He appears well-developed and well-nourished.  HENT:  Mouth/Throat: Abnormal dentition. Dental caries present. No oropharyngeal exudate.  Eyes: EOM are normal. Pupils are equal, round, and reactive to light.  Neck: Neck supple.  Cardiovascular: Normal rate, regular rhythm and normal heart sounds.   Pulmonary/Chest: Effort normal and breath sounds normal.  Abdominal: Soft. Bowel sounds are normal.  Musculoskeletal: He exhibits no edema.  Lymphadenopathy:    He has no cervical adenopathy.          Assessment & Plan:

## 2011-12-03 NOTE — Assessment & Plan Note (Signed)
He is encouraged to quit smoking, has cut down to 1/2ppd.

## 2011-12-03 NOTE — Assessment & Plan Note (Signed)
Will get him into dental clinic

## 2011-12-04 ENCOUNTER — Telehealth: Payer: Self-pay | Admitting: *Deleted

## 2011-12-04 ENCOUNTER — Other Ambulatory Visit: Payer: Self-pay | Admitting: *Deleted

## 2011-12-04 DIAGNOSIS — I10 Essential (primary) hypertension: Secondary | ICD-10-CM

## 2011-12-04 LAB — BASIC METABOLIC PANEL
Calcium: 8.3 mg/dL — ABNORMAL LOW (ref 8.4–10.5)
Glucose, Bld: 87 mg/dL (ref 70–99)
Potassium: 6.1 mEq/L — ABNORMAL HIGH (ref 3.5–5.3)
Sodium: 137 mEq/L (ref 135–145)

## 2011-12-04 NOTE — Telephone Encounter (Signed)
Call from Alinda Sierras at Dr. Donato Heinz office.  Changed Lisinopril to Amlodipine and sent order to IM to recheck patient's BMP next week. Myrtis Hopping CMA

## 2011-12-12 ENCOUNTER — Other Ambulatory Visit (INDEPENDENT_AMBULATORY_CARE_PROVIDER_SITE_OTHER): Payer: Self-pay | Admitting: *Deleted

## 2011-12-12 ENCOUNTER — Encounter: Payer: Self-pay | Admitting: Vascular Surgery

## 2011-12-12 ENCOUNTER — Ambulatory Visit (INDEPENDENT_AMBULATORY_CARE_PROVIDER_SITE_OTHER): Payer: Self-pay | Admitting: Vascular Surgery

## 2011-12-12 VITALS — BP 124/85 | HR 70 | Resp 16 | Ht 75.0 in | Wt 201.0 lb

## 2011-12-12 DIAGNOSIS — N184 Chronic kidney disease, stage 4 (severe): Secondary | ICD-10-CM

## 2011-12-12 DIAGNOSIS — N186 End stage renal disease: Secondary | ICD-10-CM
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
## 2011-12-20 ENCOUNTER — Encounter: Payer: Self-pay | Admitting: Ophthalmology

## 2011-12-20 ENCOUNTER — Ambulatory Visit (INDEPENDENT_AMBULATORY_CARE_PROVIDER_SITE_OTHER): Payer: Self-pay | Admitting: Ophthalmology

## 2011-12-20 ENCOUNTER — Telehealth: Payer: Self-pay | Admitting: Internal Medicine

## 2011-12-20 VITALS — BP 135/87 | HR 78 | Temp 97.0°F | Ht 75.5 in | Wt 207.2 lb

## 2011-12-20 DIAGNOSIS — N186 End stage renal disease: Secondary | ICD-10-CM

## 2011-12-20 DIAGNOSIS — R197 Diarrhea, unspecified: Secondary | ICD-10-CM

## 2011-12-20 DIAGNOSIS — Z5181 Encounter for therapeutic drug level monitoring: Secondary | ICD-10-CM

## 2011-12-20 DIAGNOSIS — M25569 Pain in unspecified knee: Secondary | ICD-10-CM

## 2011-12-20 DIAGNOSIS — B20 Human immunodeficiency virus [HIV] disease: Secondary | ICD-10-CM

## 2011-12-20 LAB — BASIC METABOLIC PANEL WITH GFR
CO2: 21 mEq/L (ref 19–32)
Calcium: 8.8 mg/dL (ref 8.4–10.5)
Chloride: 105 mEq/L (ref 96–112)
Glucose, Bld: 91 mg/dL (ref 70–99)
Sodium: 136 mEq/L (ref 135–145)

## 2011-12-20 MED ORDER — HYDROCODONE-ACETAMINOPHEN 5-500 MG PO TABS: 1.0000 | ORAL_TABLET | Freq: Two times a day (BID) | ORAL | Status: DC | PRN

## 2011-12-20 MED ORDER — DIPHENOXYLATE-ATROPINE 2.5-0.025 MG PO TABS: 1.0000 | ORAL_TABLET | Freq: Two times a day (BID) | ORAL | Status: DC | PRN

## 2011-12-20 MED ORDER — EFAVIRENZ 600 MG PO TABS: 600.0000 mg | ORAL_TABLET | Freq: Every day | ORAL | Status: DC

## 2011-12-20 MED ORDER — EMTRICITABINE-TENOFOVIR DF 200-300 MG PO TABS: ORAL_TABLET | ORAL | Status: DC

## 2011-12-20 NOTE — Patient Instructions (Signed)
Please follow up with vascular surgeon, Dr. Oneida Alar 2/28 1:15pm.

## 2011-12-20 NOTE — Telephone Encounter (Signed)
Lab called for critical results on  Potassium level of 6.4.  I called and left a message at 6:45PM for patient to call the on call resident back so we can tell him to go to the ED for further evaluation esp. EKG changes. I also informed NF resident as well.

## 2011-12-20 NOTE — Assessment & Plan Note (Signed)
Patient's creatinine was elevated further above 6.0 today so patient was called and told to come to ED for EKG. He is scheduled to follow up with vascular surgeon in February for fistula placement. He will likely need dialysis before this time possibly on an emergent basis.

## 2011-12-20 NOTE — Telephone Encounter (Signed)
Called Brian [name] at his [phone] and the number was not currently available. Earlier a message had been left for him to call back. His K was 6.5 on lab draw at clinic apt today. Spoke with his grandmother and was given the same number to contact him and told that he does not have another number to contact him at.

## 2011-12-20 NOTE — Assessment & Plan Note (Signed)
Refilled vicodin #60 tablets today and got a UDS. Patient had been without medication for 1 month so we will not expect the UDS to be positive for opiates.
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
## 2011-12-23 ENCOUNTER — Telehealth: Payer: Self-pay | Admitting: *Deleted

## 2011-12-23 NOTE — Telephone Encounter (Signed)
Call to and RTC from Alinda Sierras at [facility] to call pt to notify him of results of labs that were ordered per Dr. Moshe Cipro.  Alinda Sierras was faxed the results of the labs and will get them to Dr. Moshe Cipro.  Said that pt said that he does have problems getting messages.  Office had a number to try and reach pt through his grandmother.  Alinda Sierras will attempt to notify pt.  Sander Nephew, RN 12/23/2011 10:01 AM

## 2011-12-23 NOTE — Telephone Encounter (Signed)
Noted  

## 2011-12-25 ENCOUNTER — Other Ambulatory Visit: Payer: Self-pay | Admitting: Internal Medicine

## 2011-12-25 ENCOUNTER — Other Ambulatory Visit: Payer: Self-pay

## 2011-12-25 ENCOUNTER — Telehealth: Payer: Self-pay | Admitting: Internal Medicine

## 2011-12-25 DIAGNOSIS — E875 Hyperkalemia: Secondary | ICD-10-CM

## 2011-12-25 LAB — PRESCRIPTION ABUSE MONITORING 15P, URINE
Amphetamine/Meth: NEGATIVE NG/ML
Carisoprodol, Urine: NEGATIVE NG/ML
Cocaine Metabolites: NEGATIVE NG/ML
Creatinine, Urine: 60.8 MG/DL
Fentanyl, Ur: NEGATIVE NG/ML
Methadone Screen, Urine: NEGATIVE NG/ML
Opiate Screen, Urine: NEGATIVE NG/ML
Zolpidem, Urine: NEGATIVE NG/ML

## 2011-12-25 NOTE — Telephone Encounter (Signed)
Patient called saying he had been told by [facility] to come in for some labs.  I asked to speak to him. He had a dangerous K+ of 6.4 on1/4/13 and no one apparently had been able to get up with him until today. Therefore I told him to immediately go to the ED to get this evaluated and treated. I explained to him how dangerous this was. He voiced understanding and said that he would go now.

## 2011-12-25 NOTE — Telephone Encounter (Signed)
I received a request for renal labs to be done on pt from [place].  I called them because I was not sure why we got this. Pt is not here at this time.  They wanted [place] to draw labs because of cost to pt, He had called in earlier today and was asked to go to ED for evaluation and treatment of potassium of 6.4 that was drawn last Friday. We are happy to draw labs for pt and will see what Dr Moshe Cipro wants done.  Dr Moshe Cipro wants labs done in AM, she has also stopped lisinopril. I will call Alinda Sierras @ [phone] ( [phone]  ) with lab results.  Pt will be asked to come in tomorrow AM for stat labs.  Alinda Sierras will call him now. I will make appointment for labs.

## 2011-12-26 ENCOUNTER — Other Ambulatory Visit (INDEPENDENT_AMBULATORY_CARE_PROVIDER_SITE_OTHER): Payer: Self-pay

## 2011-12-26 DIAGNOSIS — E875 Hyperkalemia: Secondary | ICD-10-CM

## 2011-12-26 LAB — BASIC METABOLIC PANEL WITH GFR
Calcium: 9 mg/dL (ref 8.4–10.5)
Creat: 3.76 mg/dL — ABNORMAL HIGH (ref 0.50–1.35)
GFR, Est African American: 22 mL/min — ABNORMAL LOW
GFR, Est Non African American: 19 mL/min — ABNORMAL LOW

## 2011-12-26 NOTE — Telephone Encounter (Signed)
Pt came into clinic and had labs drawn stat.  Lab results called to Alinda Sierras, RN  at Dr Shelva Majestic office.  Potassium was 5.5 today. No action needed by [place], Pt has stopped lisinopril and instructed on low K diet by Alinda Sierras, [place] faxed to [facility]  Dr Nance Pew aware of above.

## 2011-12-27 NOTE — Procedures (Unsigned)
CEPHALIC VEIN MAPPING  INDICATION:  Preoperative vein mapping for AVF placement.  HISTORY: Chronic kidney disease, stage IV, HIV, hypertension.  EXAM: The right cephalic vein is compressible.  Diameter measurements range from 0.42 to 0.28 cm.  The right basilic vein is compressible.  Diameter measurements range from 0.74 to 0.49 cm.  The left cephalic vein is compressible.  Diameter measurements range from 0.36 to 0.26 cm.  The left basilic vein is compressible.  Diameter measurements range from 0.87 to 0.42 cm.  See attached worksheet for all measurements.  IMPRESSION:  Patent right and left cephalic and basilic veins with diameter measurements shown on the following worksheet.  ___________________________________________ Jessy Oto. Oneida Alar, MD  EM/MEDQ  D:  12/13/2011  T:  12/13/2011  Job:  [id number]

## 2012-01-03 ENCOUNTER — Encounter: Payer: Self-pay | Admitting: Ophthalmology

## 2012-01-05 ENCOUNTER — Other Ambulatory Visit: Payer: Self-pay | Admitting: Internal Medicine

## 2012-01-09 ENCOUNTER — Ambulatory Visit: Payer: Self-pay

## 2012-01-13 ENCOUNTER — Other Ambulatory Visit: Payer: Self-pay | Admitting: Pharmacist

## 2012-01-13 ENCOUNTER — Ambulatory Visit: Payer: Self-pay

## 2012-01-13 ENCOUNTER — Ambulatory Visit (INDEPENDENT_AMBULATORY_CARE_PROVIDER_SITE_OTHER): Payer: Self-pay | Admitting: Pharmacist

## 2012-01-13 DIAGNOSIS — Z86718 Personal history of other venous thrombosis and embolism: Secondary | ICD-10-CM

## 2012-01-13 DIAGNOSIS — Z7901 Long term (current) use of anticoagulants: Secondary | ICD-10-CM

## 2012-01-13 DIAGNOSIS — I2699 Other pulmonary embolism without acute cor pulmonale: Secondary | ICD-10-CM

## 2012-01-13 LAB — POCT INR: INR: 1.9

## 2012-01-13 MED ORDER — WARFARIN SODIUM 5 MG PO TABS: ORAL_TABLET | ORAL | Status: DC

## 2012-01-13 NOTE — Progress Notes (Signed)
Anti-Coagulation Progress Note  Brian [name] is a 36 y.o. male who is currently on an anti-coagulation regimen.    RECENT RESULTS: Recent results are below, the most recent result is correlated with a dose of 70 mg. per week: Lab Results  Component Value Date   INR 1.90 01/13/2012   INR 2.7 10/14/2011   INR 2.30 09/02/2011    ANTI-COAG DOSE:   Latest dosing instructions   Total Sun Mon Tue Wed Thu Fri Sat   75 10 mg 12.5 mg 10 mg 10 mg 12.5 mg 10 mg 10 mg    (5 mg2) (5 mg2.5) (5 mg2) (5 mg2) (5 mg2.5) (5 mg2) (5 mg2)         ANTICOAG SUMMARY: Anticoagulation Episode Summary              Current INR goal 2.0-3.0 Next INR check 02/10/2012   INR from last check 1.90! 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 Indefinite therapy suggested.       Provider Role Specialty Phone number   Larey Dresser, MD  Internal Medicine [phone]        ANTICOAG TODAY: Anticoagulation Summary as of 01/13/2012              INR goal 2.0-3.0     Selected INR 1.90! 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 Indefinite therapy suggested.       Provider Role Specialty Phone number   Larey Dresser, MD  Internal Medicine [phone]        PATIENT INSTRUCTIONS: Patient Instructions  Patient  instructed to take medications as defined in the Anti-coagulation Track section of this encounter.  Patient instructed to take today's dose.  Patient verbalized understanding of these instructions.        FOLLOW-UP Return in 4 weeks (on 02/10/2012) for Follow up INR.  Jorene Guest, III Pharm.D., CACP

## 2012-01-13 NOTE — Patient Instructions (Signed)
Patient instructed to take medications as defined in the Anti-coagulation Track section of this encounter.  Patient instructed to take today's dose.  Patient verbalized understanding of these instructions.    

## 2012-01-22 NOTE — Progress Notes (Signed)
Subjective:    Patient ID: Brian [name] is a 36 y.o. male.  Chief Complaint: HIV Follow-up Visit Brian [name] is here for follow-up of HIV infection. He is feeling better since his last visit.  He claims continued adherence to therapy with good tolerance and no complications. There are not additional complaints.   Data Review: Diagnostic studies reviewed.  Review of Systems - General ROS: positive for  - fatigue negative for - chills, fever or malaise Psychological ROS: negative for - anxiety, behavioral disorder, concentration difficulties, depression, memory difficulties or mood swings Ophthalmic ROS: negative ENT ROS: negative Respiratory ROS: no cough, shortness of breath, or wheezing Cardiovascular ROS: no chest pain or dyspnea on exertion Gastrointestinal ROS: no abdominal pain, change in bowel habits, or black or bloody stools Genito-Urinary ROS: no dysuria, trouble voiding, or hematuria Musculoskeletal ROS: negative Neurological ROS: no TIA or stroke symptoms Dermatological ROS: negative for rash and skin lesion changes  Objective:   General appearance: alert, cooperative and no distress Head: Normocephalic, without obvious abnormality, atraumatic Eyes: conjunctivae/corneas clear. PERRL, EOM's intact. Fundi benign. Ears: normal TM's and external ear canals both ears Throat: abnormal findings: dentition: poor Resp: clear to auscultation bilaterally Cardio: regular rate and rhythm, S1, S2 normal, no murmur, click, rub or gallop GI: soft, non-tender; bowel sounds normal; no masses,  no organomegaly Extremities: extremities normal, atraumatic, no cyanosis or edema Skin: Skin color, texture, turgor normal. No rashes or lesions Neurologic: Alert and oriented X 3, normal strength and tone. Normal symmetric reflexes. Normal coordination and gait Psych:  No vegetative signs or delusional behaviors noted.    Laboratory: From 07/03/2011 ,  CD4 count was 660 c/cmm @ 42  %. Viral load <20 copies/ml.  Serum sodium 134, potassium 5.5, CO2 18, BUN 42, creatinine 3.1. White count 4.7, hemoglobin 11.2, hematocrit 33.7, platelets 178,000   Assessment/Plan:   HIV INFECTION Clinically stable on current regimen. Continue present management.  Counseling provided on prevention of transmission of HIV. Condoms offered:  Refused Medication adherence discussed with patient.  Follow up visit in 4 months with labs 2 weeks prior to appointment. Patient verbally acknowledged information provided to them and agreed with plan of care.   POLYCYSTIC KIDNEY DISEASE Continue followup with nephrologist. HIV therapies dose according to GFR at every 72 hours.  ESSENTIAL HYPERTENSION BP today was 118/74, which shows good control. Continue present management, and followup with nephrology.     Aws Shere A. Magdalen Spatz, [place], [facility] [phone]  01/22/2012, 2:08 PM

## 2012-01-22 NOTE — Assessment & Plan Note (Signed)
BP today was 118/74, which shows good control. Continue present management, and followup with nephrology.

## 2012-01-22 NOTE — Assessment & Plan Note (Signed)
Clinically stable on current regimen. Continue present management.  Counseling provided on prevention of transmission of HIV. Condoms offered:  Refused Medication adherence discussed with patient.  Follow up visit in 4 months with labs 2 weeks prior to appointment. Patient verbally acknowledged information provided to them and agreed with plan of care.

## 2012-01-22 NOTE — Assessment & Plan Note (Signed)
Continue followup with nephrologist. HIV therapies dose according to GFR at every 72 hours.

## 2012-01-24 ENCOUNTER — Other Ambulatory Visit: Payer: Self-pay | Admitting: *Deleted

## 2012-01-24 NOTE — Telephone Encounter (Signed)
Patient should make appt to be seen. We don't want him to be on vicodin long term.

## 2012-01-27 ENCOUNTER — Other Ambulatory Visit: Payer: Self-pay | Admitting: *Deleted

## 2012-01-27 NOTE — Telephone Encounter (Signed)
Pt was called; message left on answering machine to call the clinic for an appt. Also refill request needs to be refuse in order to close this encounter   Thanks

## 2012-01-27 NOTE — Telephone Encounter (Signed)
Pt called again about vicodin refill.  He has been on vicodin for over one year and uses it instead of aleve.  Will you refill until next appointment ?

## 2012-01-27 NOTE — Telephone Encounter (Signed)
Pt calls and states he has been out of pain med for appr 5 days, he has appt scheduled for march, at last visit which was jan 2013 it states to return in 2 months, pt wants at least enough to last until his appt. Is this possible?

## 2012-01-28 MED ORDER — HYDROCODONE-ACETAMINOPHEN 5-500 MG PO TABS: 1.0000 | ORAL_TABLET | Freq: Two times a day (BID) | ORAL | Status: DC | PRN

## 2012-01-28 NOTE — Telephone Encounter (Signed)
Called to pharm 

## 2012-02-06 ENCOUNTER — Other Ambulatory Visit: Payer: Self-pay | Admitting: Infectious Diseases

## 2012-02-07 ENCOUNTER — Other Ambulatory Visit: Payer: Self-pay | Admitting: *Deleted

## 2012-02-07 DIAGNOSIS — R197 Diarrhea, unspecified: Secondary | ICD-10-CM

## 2012-02-07 MED ORDER — DIPHENOXYLATE-ATROPINE 2.5-0.025 MG PO TABS: 1.0000 | ORAL_TABLET | Freq: Two times a day (BID) | ORAL | Status: DC

## 2012-02-10 ENCOUNTER — Ambulatory Visit: Payer: Self-pay

## 2012-02-12 ENCOUNTER — Encounter: Payer: Self-pay | Admitting: Vascular Surgery

## 2012-02-13 ENCOUNTER — Ambulatory Visit (INDEPENDENT_AMBULATORY_CARE_PROVIDER_SITE_OTHER): Payer: Self-pay | Admitting: Vascular Surgery

## 2012-02-13 ENCOUNTER — Encounter (HOSPITAL_COMMUNITY): Payer: Self-pay | Admitting: Pharmacy Technician

## 2012-02-13 ENCOUNTER — Encounter: Payer: Self-pay | Admitting: Vascular Surgery

## 2012-02-13 VITALS — BP 140/95 | HR 87 | Resp 16 | Ht 74.0 in | Wt 213.0 lb

## 2012-02-13 DIAGNOSIS — N186 End stage renal disease: Secondary | ICD-10-CM
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
## 2012-02-14 ENCOUNTER — Encounter: Payer: Self-pay | Admitting: Ophthalmology

## 2012-02-14 ENCOUNTER — Ambulatory Visit (INDEPENDENT_AMBULATORY_CARE_PROVIDER_SITE_OTHER): Payer: Self-pay | Admitting: Ophthalmology

## 2012-02-14 DIAGNOSIS — M25569 Pain in unspecified knee: Secondary | ICD-10-CM

## 2012-02-14 MED ORDER — HYDROCODONE-ACETAMINOPHEN 5-500 MG PO TABS: 1.0000 | ORAL_TABLET | Freq: Two times a day (BID) | ORAL | Status: DC | PRN

## 2012-02-14 NOTE — Progress Notes (Signed)
Pre-op admission orders sent

## 2012-02-14 NOTE — Patient Instructions (Signed)
Please take your pain medicine as prescribed and do not use marijuana or any other substance.

## 2012-02-14 NOTE — Progress Notes (Signed)
Addended by: Richrd Prime on: 02/14/2012 04:42 PM   Modules accepted: Orders
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
## 2012-02-17 ENCOUNTER — Inpatient Hospital Stay (HOSPITAL_COMMUNITY)
Admission: AD | Admit: 2012-02-17 | Discharge: 2012-02-25 | DRG: 674 | Disposition: A | Discharge status: Home / Self Care | Payer: Medicaid Other | Source: Ambulatory Visit | Attending: Vascular Surgery | Admitting: Vascular Surgery

## 2012-02-17 ENCOUNTER — Encounter (HOSPITAL_COMMUNITY): Payer: Self-pay | Admitting: General Practice

## 2012-02-17 DIAGNOSIS — Z21 Asymptomatic human immunodeficiency virus [HIV] infection status: Secondary | ICD-10-CM | POA: Diagnosis present

## 2012-02-17 DIAGNOSIS — I712 Thoracic aortic aneurysm, without rupture, unspecified: Secondary | ICD-10-CM | POA: Diagnosis present

## 2012-02-17 DIAGNOSIS — N185 Chronic kidney disease, stage 5: Principal | ICD-10-CM | POA: Diagnosis present

## 2012-02-17 DIAGNOSIS — Z86711 Personal history of pulmonary embolism: Secondary | ICD-10-CM

## 2012-02-17 DIAGNOSIS — N2581 Secondary hyperparathyroidism of renal origin: Secondary | ICD-10-CM | POA: Diagnosis present

## 2012-02-17 DIAGNOSIS — Q613 Polycystic kidney, unspecified: Secondary | ICD-10-CM

## 2012-02-17 DIAGNOSIS — F1010 Alcohol abuse, uncomplicated: Secondary | ICD-10-CM | POA: Diagnosis present

## 2012-02-17 DIAGNOSIS — E119 Type 2 diabetes mellitus without complications: Secondary | ICD-10-CM | POA: Diagnosis present

## 2012-02-17 DIAGNOSIS — K219 Gastro-esophageal reflux disease without esophagitis: Secondary | ICD-10-CM | POA: Diagnosis present

## 2012-02-17 DIAGNOSIS — Z79899 Other long term (current) drug therapy: Secondary | ICD-10-CM

## 2012-02-17 DIAGNOSIS — I120 Hypertensive chronic kidney disease with stage 5 chronic kidney disease or end stage renal disease: Secondary | ICD-10-CM | POA: Diagnosis present

## 2012-02-17 DIAGNOSIS — Z7901 Long term (current) use of anticoagulants: Secondary | ICD-10-CM

## 2012-02-17 DIAGNOSIS — F17200 Nicotine dependence, unspecified, uncomplicated: Secondary | ICD-10-CM | POA: Diagnosis present

## 2012-02-17 DIAGNOSIS — D649 Anemia, unspecified: Secondary | ICD-10-CM | POA: Diagnosis present

## 2012-02-17 HISTORY — DX: Acute embolism and thrombosis of unspecified deep veins of unspecified lower extremity: I82.409

## 2012-02-17 HISTORY — DX: Other chronic pain: G89.29

## 2012-02-17 HISTORY — DX: Depression, unspecified: F32.A

## 2012-02-17 HISTORY — DX: Major depressive disorder, single episode, unspecified: F32.9

## 2012-02-17 HISTORY — DX: Dorsalgia, unspecified: M54.9

## 2012-02-17 LAB — MRSA PCR SCREENING: MRSA by PCR: POSITIVE — AB

## 2012-02-17 LAB — HEPARIN LEVEL (UNFRACTIONATED): Heparin Unfractionated: 0.27 IU/mL — ABNORMAL LOW (ref 0.30–0.70)

## 2012-02-17 MED ORDER — DEXTROSE 5 % IV SOLN
1.5000 g | INTRAVENOUS | Status: AC
  Administered 2012-02-18: 1.5 g via INTRAVENOUS
  Filled 2012-02-17: qty 1.5

## 2012-02-17 MED ORDER — PARICALCITOL 1 MCG PO CAPS
1.0000 ug | ORAL_CAPSULE | Freq: Every day | ORAL | Status: DC
  Administered 2012-02-17 – 2012-02-25 (×9): 1 ug via ORAL
  Filled 2012-02-17 (×10): qty 1

## 2012-02-17 MED ORDER — METOPROLOL TARTRATE 1 MG/ML IV SOLN
2.0000 mg | INTRAVENOUS | Status: DC | PRN
  Filled 2012-02-17: qty 5

## 2012-02-17 MED ORDER — OXYCODONE HCL 5 MG PO TABS: 5.0000 mg | ORAL_TABLET | ORAL | Status: DC | PRN

## 2012-02-17 MED ORDER — DIPHENOXYLATE-ATROPINE 2.5-0.025 MG PO TABS
1.0000 | ORAL_TABLET | Freq: Two times a day (BID) | ORAL | Status: DC
  Administered 2012-02-21: 1 via ORAL
  Filled 2012-02-17 (×3): qty 1

## 2012-02-17 MED ORDER — ONDANSETRON HCL 4 MG/2ML IJ SOLN: 4.0000 mg | Freq: Four times a day (QID) | INTRAMUSCULAR | Status: DC | PRN

## 2012-02-17 MED ORDER — HYDROCODONE-ACETAMINOPHEN 5-325 MG PO TABS
1.0000 | ORAL_TABLET | ORAL | Status: DC | PRN
  Administered 2012-02-17 – 2012-02-19 (×9): 2 via ORAL
  Administered 2012-02-20 (×2): 1 via ORAL
  Administered 2012-02-20 – 2012-02-25 (×17): 2 via ORAL
  Filled 2012-02-17 (×10): qty 2
  Filled 2012-02-17: qty 1
  Filled 2012-02-17 (×4): qty 2
  Filled 2012-02-17: qty 1
  Filled 2012-02-17 (×11): qty 2

## 2012-02-17 MED ORDER — PHENOL 1.4 % MT LIQD
1.0000 | OROMUCOSAL | Status: DC | PRN
  Filled 2012-02-17: qty 177

## 2012-02-17 MED ORDER — SODIUM CHLORIDE 0.9 % IJ SOLN: 3.0000 mL | Freq: Two times a day (BID) | INTRAMUSCULAR | Status: DC

## 2012-02-17 MED ORDER — HYDRALAZINE HCL 20 MG/ML IJ SOLN
10.0000 mg | INTRAMUSCULAR | Status: DC | PRN
  Filled 2012-02-17: qty 0.5

## 2012-02-17 MED ORDER — MUPIROCIN 2 % EX OINT
1.0000 "application " | TOPICAL_OINTMENT | Freq: Two times a day (BID) | CUTANEOUS | Status: AC
  Administered 2012-02-17 – 2012-02-22 (×10): 1 via NASAL
  Filled 2012-02-17 (×2): qty 22

## 2012-02-17 MED ORDER — AMLODIPINE BESYLATE 10 MG PO TABS
10.0000 mg | ORAL_TABLET | Freq: Every day | ORAL | Status: DC
  Administered 2012-02-17 – 2012-02-25 (×9): 10 mg via ORAL
  Filled 2012-02-17 (×9): qty 1

## 2012-02-17 MED ORDER — CHLORHEXIDINE GLUCONATE CLOTH 2 % EX PADS
6.0000 | MEDICATED_PAD | Freq: Every day | CUTANEOUS | Status: AC
  Administered 2012-02-18 – 2012-02-22 (×5): 6 via TOPICAL

## 2012-02-17 MED ORDER — SODIUM CHLORIDE 0.9 % IV SOLN: 250.0000 mL | INTRAVENOUS | Status: DC | PRN

## 2012-02-17 MED ORDER — NICOTINE 21 MG/24HR TD PT24
21.0000 mg | MEDICATED_PATCH | Freq: Every day | TRANSDERMAL | Status: DC
  Administered 2012-02-17 – 2012-02-25 (×9): 21 mg via TRANSDERMAL
  Filled 2012-02-17 (×9): qty 1

## 2012-02-17 MED ORDER — EFAVIRENZ 600 MG PO TABS
600.0000 mg | ORAL_TABLET | Freq: Every day | ORAL | Status: DC
Start: 2012-02-17 — End: 2012-02-25
  Administered 2012-02-17 – 2012-02-24 (×8): 600 mg via ORAL
  Filled 2012-02-17 (×9): qty 1

## 2012-02-17 MED ORDER — LABETALOL HCL 5 MG/ML IV SOLN
10.0000 mg | INTRAVENOUS | Status: DC | PRN
  Filled 2012-02-17: qty 4

## 2012-02-17 MED ORDER — HEPARIN (PORCINE) IN NACL 100-0.45 UNIT/ML-% IJ SOLN
1600.0000 [IU]/h | INTRAMUSCULAR | Status: DC
  Administered 2012-02-17: 1450 [IU]/h via INTRAVENOUS
  Administered 2012-02-18: 1600 [IU]/h via INTRAVENOUS
  Filled 2012-02-17 (×3): qty 250

## 2012-02-17 MED ORDER — SODIUM CHLORIDE 0.9 % IJ SOLN: 3.0000 mL | INTRAMUSCULAR | Status: DC | PRN

## 2012-02-17 MED ORDER — EMTRICITABINE-TENOFOVIR DF 200-300 MG PO TABS
1.0000 | ORAL_TABLET | Freq: Every day | ORAL | Status: DC
  Administered 2012-02-17: 1 via ORAL
  Filled 2012-02-17 (×2): qty 1

## 2012-02-17 NOTE — Progress Notes (Signed)
ANTICOAGULATION CONSULT NOTE - Follow Up Consult  Pharmacy Consult for heparin Indication: h/o PE  Labs:  Basename 02/17/12 2241 02/17/12 1301  HGB -- --  HCT -- --  PLT -- --  APTT -- --  LABPROT -- 16.4*  INR -- 1.30  HEPARINUNFRC 0.27* --  CREATININE -- --  CKTOTAL -- --  CKMB -- --  TROPONINI -- --   Assessment: 35yo male subtherapeutic on heparin with initial dosing for bridge prior to surgery.  Goal of Therapy:  Heparin level 0.3-0.7 units/ml   Plan:  Will increase heparin gtt by 1-2 units/kg/hr to 1600 units/hr and check level in 8hr.  Rogue Bussing PharmD BCPS 02/17/2012,11:31 PM

## 2012-02-17 NOTE — Assessment & Plan Note (Addendum)
Discussed with patient the terms of our pain contract. He agrees to abstain from marijuana from this point forward. Clarified with him that he is being given a second chance. Continue to check urine at next visit to ensure that he is taking medications and not diverting them. Did not draw UDS today since it is likely positive since he admits to smoking marijuana about 2 weeks ago.
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
## 2012-02-18 ENCOUNTER — Other Ambulatory Visit: Payer: Self-pay

## 2012-02-18 ENCOUNTER — Ambulatory Visit (HOSPITAL_COMMUNITY): Admission: RE | Admit: 2012-02-18 | Payer: Self-pay | Source: Ambulatory Visit | Admitting: Vascular Surgery

## 2012-02-18 ENCOUNTER — Encounter (HOSPITAL_COMMUNITY): Payer: Self-pay | Admitting: Anesthesiology

## 2012-02-18 ENCOUNTER — Encounter (HOSPITAL_COMMUNITY)
Admission: AD | Disposition: A | Discharge status: Home / Self Care | Payer: Self-pay | Source: Ambulatory Visit | Attending: Vascular Surgery

## 2012-02-18 ENCOUNTER — Inpatient Hospital Stay (HOSPITAL_COMMUNITY): Payer: Medicaid Other | Admitting: Anesthesiology

## 2012-02-18 DIAGNOSIS — N186 End stage renal disease: Secondary | ICD-10-CM

## 2012-02-18 HISTORY — PX: AV FISTULA PLACEMENT: SHX1204

## 2012-02-18 LAB — HEPARIN LEVEL (UNFRACTIONATED): Heparin Unfractionated: 0.41 IU/mL (ref 0.30–0.70)

## 2012-02-18 LAB — PROTIME-INR
INR: 1.1 (ref 0.00–1.49)
Prothrombin Time: 14.4 seconds (ref 11.6–15.2)

## 2012-02-18 LAB — CBC
Platelets: 177 10*3/uL (ref 150–400)
RDW: 12.9 % (ref 11.5–15.5)
WBC: 7.2 10*3/uL (ref 4.0–10.5)

## 2012-02-18 LAB — BASIC METABOLIC PANEL
Calcium: 8.5 mg/dL (ref 8.4–10.5)
Creatinine, Ser: 3.61 mg/dL — ABNORMAL HIGH (ref 0.50–1.35)
GFR calc Af Amer: 23 mL/min — ABNORMAL LOW (ref >90)

## 2012-02-18 LAB — GLUCOSE, CAPILLARY
Glucose-Capillary: 93 mg/dL (ref 70–99)
Glucose-Capillary: 92 mg/dL (ref 70–99)

## 2012-02-18 SURGERY — ARTERIOVENOUS (AV) FISTULA CREATION
Anesthesia: Monitor Anesthesia Care | Site: Arm Lower | Laterality: Right | Wound class: Clean

## 2012-02-18 MED ORDER — WARFARIN SODIUM 10 MG PO TABS: 10.0000 mg | ORAL_TABLET | Freq: Every day | ORAL | Status: DC

## 2012-02-18 MED ORDER — PROPOFOL 10 MG/ML IV EMUL
INTRAVENOUS | Status: DC | PRN
  Administered 2012-02-18: 50 ug/kg/min via INTRAVENOUS

## 2012-02-18 MED ORDER — 0.9 % SODIUM CHLORIDE (POUR BTL) OPTIME
TOPICAL | Status: DC | PRN
  Administered 2012-02-18: 1000 mL

## 2012-02-18 MED ORDER — WARFARIN SODIUM 10 MG PO TABS
10.0000 mg | ORAL_TABLET | Freq: Once | ORAL | Status: AC
  Administered 2012-02-18: 10 mg via ORAL
  Filled 2012-02-18: qty 1

## 2012-02-18 MED ORDER — SODIUM CHLORIDE 0.9 % IV SOLN
INTRAVENOUS | Status: DC | PRN
  Administered 2012-02-18: 15:00:00 via INTRAVENOUS

## 2012-02-18 MED ORDER — WARFARIN - PHARMACIST DOSING INPATIENT
Freq: Every day | Status: DC
  Filled 2012-02-18 (×4): qty 1

## 2012-02-18 MED ORDER — ONDANSETRON HCL 4 MG/2ML IJ SOLN: 4.0000 mg | Freq: Once | INTRAMUSCULAR | Status: AC | PRN

## 2012-02-18 MED ORDER — HEPARIN (PORCINE) IN NACL 100-0.45 UNIT/ML-% IJ SOLN
1800.0000 [IU]/h | INTRAMUSCULAR | Status: DC
  Administered 2012-02-19 – 2012-02-22 (×5): 1600 [IU]/h via INTRAVENOUS
  Administered 2012-02-23 – 2012-02-24 (×3): 1800 [IU]/h via INTRAVENOUS
  Filled 2012-02-18 (×14): qty 250

## 2012-02-18 MED ORDER — MIDAZOLAM HCL 5 MG/5ML IJ SOLN
INTRAMUSCULAR | Status: DC | PRN
  Administered 2012-02-18 (×2): 2 mg via INTRAVENOUS

## 2012-02-18 MED ORDER — FENTANYL CITRATE 0.05 MG/ML IJ SOLN
INTRAMUSCULAR | Status: DC | PRN
  Administered 2012-02-18: 150 ug via INTRAVENOUS
  Administered 2012-02-18 (×2): 100 ug via INTRAVENOUS

## 2012-02-18 MED ORDER — SODIUM CHLORIDE 0.9 % IR SOLN
Status: DC | PRN
  Administered 2012-02-18: 15:00:00

## 2012-02-18 MED ORDER — EMTRICITABINE-TENOFOVIR DF 200-300 MG PO TABS
1.0000 | ORAL_TABLET | ORAL | Status: DC
  Administered 2012-02-19 – 2012-02-24 (×3): 1 via ORAL
  Filled 2012-02-18 (×3): qty 1

## 2012-02-18 MED ORDER — PROTAMINE SULFATE 10 MG/ML IV SOLN
INTRAVENOUS | Status: DC | PRN
  Administered 2012-02-18: 30 mg via INTRAVENOUS

## 2012-02-18 MED ORDER — LIDOCAINE HCL (PF) 1 % IJ SOLN
INTRAMUSCULAR | Status: DC | PRN
  Administered 2012-02-18: 9 mL

## 2012-02-18 MED ORDER — SODIUM CHLORIDE 0.9 % IV SOLN
INTRAVENOUS | Status: DC
  Administered 2012-02-18: 13:00:00 via INTRAVENOUS
  Administered 2012-02-18 – 2012-02-24 (×3): 10 mL/h via INTRAVENOUS

## 2012-02-18 MED ORDER — DEXTROSE 5 % IV SOLN
INTRAVENOUS | Status: DC | PRN
  Administered 2012-02-18: 15:00:00 via INTRAVENOUS

## 2012-02-18 MED ORDER — ONDANSETRON HCL 4 MG/2ML IJ SOLN
INTRAMUSCULAR | Status: DC | PRN
  Administered 2012-02-18: 4 mg via INTRAVENOUS

## 2012-02-18 MED ORDER — HEPARIN SODIUM (PORCINE) 1000 UNIT/ML IJ SOLN
INTRAMUSCULAR | Status: DC | PRN
  Administered 2012-02-18: 7000 [IU] via INTRAVENOUS

## 2012-02-18 MED ORDER — HYDROMORPHONE HCL PF 1 MG/ML IJ SOLN
0.2500 mg | INTRAMUSCULAR | Status: DC | PRN
  Administered 2012-02-18 – 2012-02-20 (×4): 0.5 mg via INTRAVENOUS
  Filled 2012-02-18 (×2): qty 1

## 2012-02-18 MED ORDER — HYDROMORPHONE HCL PF 1 MG/ML IJ SOLN
INTRAMUSCULAR | Status: AC
  Filled 2012-02-18: qty 1

## 2012-02-18 SURGICAL SUPPLY — 35 items
ADH SKN CLS APL DERMABOND .7 (GAUZE/BANDAGES/DRESSINGS) ×1
CANISTER SUCTION 2500CC (MISCELLANEOUS) ×2 IMPLANT
CLIP TI MEDIUM 6 (CLIP) ×2 IMPLANT
CLIP TI WIDE RED SMALL 6 (CLIP) ×4 IMPLANT
CLOTH BEACON ORANGE TIMEOUT ST (SAFETY) ×2 IMPLANT
COVER PROBE W GEL 5X96 (DRAPES) ×2 IMPLANT
COVER SURGICAL LIGHT HANDLE (MISCELLANEOUS) ×4 IMPLANT
DECANTER SPIKE VIAL GLASS SM (MISCELLANEOUS) ×2 IMPLANT
DERMABOND ADVANCED (GAUZE/BANDAGES/DRESSINGS) ×1
DERMABOND ADVANCED .7 DNX12 (GAUZE/BANDAGES/DRESSINGS) ×1 IMPLANT
DRAIN PENROSE 1/2X12 LTX STRL (WOUND CARE) IMPLANT
ELECT REM PT RETURN 9FT ADLT (ELECTROSURGICAL) ×2
ELECTRODE REM PT RTRN 9FT ADLT (ELECTROSURGICAL) ×1 IMPLANT
GLOVE BIO SURGEON STRL SZ 6.5 (GLOVE) ×2 IMPLANT
GLOVE BIO SURGEON STRL SZ7.5 (GLOVE) ×2 IMPLANT
GLOVE BIOGEL PI IND STRL 6.5 (GLOVE) IMPLANT
GLOVE BIOGEL PI IND STRL 7.5 (GLOVE) ×1 IMPLANT
GLOVE BIOGEL PI INDICATOR 6.5 (GLOVE) ×1
GLOVE BIOGEL PI INDICATOR 7.5 (GLOVE) ×1
GOWN STRL NON-REIN LRG LVL3 (GOWN DISPOSABLE) ×4 IMPLANT
KIT BASIN OR (CUSTOM PROCEDURE TRAY) ×2 IMPLANT
KIT ROOM TURNOVER OR (KITS) ×2 IMPLANT
NS IRRIG 1000ML POUR BTL (IV SOLUTION) ×2 IMPLANT
PACK CV ACCESS (CUSTOM PROCEDURE TRAY) ×2 IMPLANT
PAD ARMBOARD 7.5X6 YLW CONV (MISCELLANEOUS) ×4 IMPLANT
SPONGE GAUZE 4X4 12PLY (GAUZE/BANDAGES/DRESSINGS) ×2 IMPLANT
SPONGE SURGIFOAM ABS GEL 100 (HEMOSTASIS) IMPLANT
SUT PROLENE 6 0 BV (SUTURE) ×2 IMPLANT
SUT VIC AB 3-0 SH 27 (SUTURE) ×2
SUT VIC AB 3-0 SH 27X BRD (SUTURE) ×1 IMPLANT
SUT VICRYL 4-0 PS2 18IN ABS (SUTURE) ×2 IMPLANT
TOWEL OR 17X24 6PK STRL BLUE (TOWEL DISPOSABLE) ×2 IMPLANT
TOWEL OR 17X26 10 PK STRL BLUE (TOWEL DISPOSABLE) ×2 IMPLANT
UNDERPAD 30X30 INCONTINENT (UNDERPADS AND DIAPERS) ×2 IMPLANT
WATER STERILE IRR 1000ML POUR (IV SOLUTION) ×2 IMPLANT
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
## 2012-02-18 NOTE — Op Note (Signed)
NAME: Brian [name]   MRN: [id number] DOB: 11/06/1976    DATE OF OPERATION: 02/18/2012  PREOP DIAGNOSIS: Chronic kidney disease  POSTOP DIAGNOSIS: Same  PROCEDURE: Right brachiocephalic AV fistula  SURGEON: Judeth Cornfield. Scot Dock, MD, FACS  ASSIST: Gerri Lins PA  ANESTHESIA: local with sedation   EBL: minimal  INDICATIONS: [name] is a 36 y.o. male who presents for new access.  FINDINGS: 123456 mm cephalic vein in the upper arm  TECHNIQUE: The patient was brought to the operating room and sedated by anesthesia. Right upper extremity was prepped and draped in the usual sterile fashion. After the skin was anesthetized with 1% lidocaine, a transverse incision was made just above the antecubital level. The cephalic vein was dissected free. The medial branch was ligated distally and irrigated a nicely with heparinized saline. The lateral branch was ligated. The brachial artery was dissected free beneath the fascia. The brachial artery was clamped proximally and distally after the patient was heparinized. A longitudinal arteriotomy was made. The vein was sewn end-to-side to the artery using continuous 6-0 Prolene suture. At the completion was an excellent thrill in the fistula and a palpable radial pulse. One more proximal branch was also ligated. Hemostasis was obtained in the wound and the heparin was partially reversed with protamine. The wounds closed the deep layer 3-0 Vicryl. The skin was closed with a 40 subcutaneous stitch. Dermabond was applied. The patient tolerated the procedure well and was transferred to the recovery room in stable condition. All needle and sponge counts were correct.   Deitra Mayo, MD, FACS [facility]  DATE OF DICTATION:   02/18/2012

## 2012-02-18 NOTE — Preoperative (Signed)
Beta Blockers   Reason not to administer Beta Blockers:Not Applicable 

## 2012-02-18 NOTE — Transfer of Care (Signed)
Immediate Anesthesia Transfer of Care Note  Patient: Brian [name]  Procedure(s) Performed: Procedure(s) (LRB): ARTERIOVENOUS (AV) FISTULA CREATION (Right)  Patient Location: PACU  Anesthesia Type: MAC  Level of Consciousness: awake, alert , oriented, patient cooperative and responds to stimulation  Airway & Oxygen Therapy: Patient Spontanous Breathing  Post-op Assessment: Report given to PACU RN, Post -op Vital signs reviewed and stable, Patient moving all extremities and Patient moving all extremities X 4  Post vital signs: Reviewed and stable  Complications: No apparent anesthesia complications

## 2012-02-18 NOTE — Anesthesia Postprocedure Evaluation (Signed)
  Anesthesia Post-op Note  Patient: Brian [name]  Procedure(s) Performed: Procedure(s) (LRB): ARTERIOVENOUS (AV) FISTULA CREATION (Right)  Patient Location: PACU  Anesthesia Type: General  Level of Consciousness: awake  Airway and Oxygen Therapy: Patient Spontanous Breathing  Post-op Pain: mild  Post-op Assessment: Post-op Vital signs reviewed  Post-op Vital Signs: stable  Complications: No apparent anesthesia complications

## 2012-02-18 NOTE — Interval H&P Note (Signed)
History and Physical Interval Note:  02/18/2012 2:20 PM  Brian [name]  has presented today for surgery, with the diagnosis of ESRD  The various methods of treatment have been discussed with the patient and family. After consideration of risks, benefits and other options for treatment, the patient has consented to: ARTERIOVENOUS (AV) FISTULA CREATION (Right) as a surgical intervention .  The patients' history has been reviewed, patient examined, no change in status, stable for surgery.  I have reviewed the patients' chart and labs.  Questions were answered to the patient's satisfaction.     Cara Thaxton S

## 2012-02-18 NOTE — Progress Notes (Addendum)
Pt returned from OR after having an RUA fistula placed. Has some dermabond over the incision with some swelling. Pt states his pain is a 7 out 10 with some throbbing. Bruitt and Thrill present. No drainage. VS stable. Diastolic BP elevated. Will cont to monitor.

## 2012-02-18 NOTE — Anesthesia Procedure Notes (Signed)
Procedure Name: MAC Date/Time: 02/18/2012 2:50 PM Performed by: Jacquiline Doe A Pre-anesthesia Checklist: Patient identified, Timeout performed, Emergency Drugs available, Suction available and Patient being monitored Patient Re-evaluated:Patient Re-evaluated prior to inductionOxygen Delivery Method: Simple face mask Preoxygenation: Pre-oxygenation with 100% oxygen Intubation Type: IV induction Placement Confirmation: positive ETCO2 Dental Injury: Teeth and Oropharynx as per pre-operative assessment

## 2012-02-18 NOTE — Anesthesia Preprocedure Evaluation (Addendum)
Anesthesia Evaluation  Patient identified by MRN, date of birth, ID band Patient awake    Reviewed: Allergy & Precautions, H&P , NPO status , Patient's Chart, lab work & pertinent test results  History of Anesthesia Complications Negative for: history of anesthetic complications  Airway Mallampati: I      Dental  (+) Poor Dentition   Pulmonary  breath sounds clear to auscultation        Cardiovascular hypertension,     Neuro/Psych    GI/Hepatic GERD-  ,(+)     substance abuse  alcohol use, cocaine use and marijuana use,   Endo/Other  Diabetes mellitus-  Renal/GU ESRFRenal disease     Musculoskeletal   Abdominal   Peds  Hematology  (+) HIV,   Anesthesia Other Findings   Reproductive/Obstetrics                          Anesthesia Physical Anesthesia Plan  ASA: III  Anesthesia Plan: MAC   Post-op Pain Management:    Induction: Intravenous  Airway Management Planned:   Additional Equipment:   Intra-op Plan:   Post-operative Plan:   Informed Consent: I have reviewed the patients History and Physical, chart, labs and discussed the procedure including the risks, benefits and alternatives for the proposed anesthesia with the patient or authorized representative who has indicated his/her understanding and acceptance.   Dental advisory given  Plan Discussed with:   Anesthesia Plan Comments:         Anesthesia Quick Evaluation
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
## 2012-02-19 ENCOUNTER — Encounter (HOSPITAL_COMMUNITY): Payer: Self-pay | Admitting: Vascular Surgery

## 2012-02-19 LAB — BASIC METABOLIC PANEL
BUN: 45 mg/dL — ABNORMAL HIGH (ref 6–23)
Calcium: 8.5 mg/dL (ref 8.4–10.5)
GFR calc Af Amer: 21 mL/min — ABNORMAL LOW (ref >90)
GFR calc non Af Amer: 18 mL/min — ABNORMAL LOW (ref >90)
Glucose, Bld: 94 mg/dL (ref 70–99)
Potassium: 4.5 mEq/L (ref 3.5–5.1)
Sodium: 136 mEq/L (ref 135–145)

## 2012-02-19 LAB — CBC
MCH: 31.5 pg (ref 26.0–34.0)
MCHC: 33.7 g/dL (ref 30.0–36.0)
Platelets: 143 10*3/uL — ABNORMAL LOW (ref 150–400)

## 2012-02-19 LAB — PROTIME-INR
INR: 1.04 (ref 0.00–1.49)
Prothrombin Time: 13.8 seconds (ref 11.6–15.2)

## 2012-02-19 LAB — GLUCOSE, CAPILLARY: Glucose-Capillary: 95 mg/dL (ref 70–99)

## 2012-02-19 MED ORDER — SODIUM BICARBONATE 650 MG PO TABS
650.0000 mg | ORAL_TABLET | Freq: Three times a day (TID) | ORAL | Status: DC
  Administered 2012-02-19 – 2012-02-25 (×18): 650 mg via ORAL
  Filled 2012-02-19 (×20): qty 1

## 2012-02-19 MED ORDER — WARFARIN SODIUM 7.5 MG PO TABS
15.0000 mg | ORAL_TABLET | Freq: Once | ORAL | Status: AC
  Administered 2012-02-19: 15 mg via ORAL
  Filled 2012-02-19: qty 2

## 2012-02-19 MED ORDER — WARFARIN - PHARMACIST DOSING INPATIENT
Freq: Every day | Status: DC
  Administered 2012-02-19 – 2012-02-24 (×5)
  Filled 2012-02-19 (×4): qty 1

## 2012-02-19 MED ORDER — WARFARIN SODIUM 10 MG PO TABS
10.0000 mg | ORAL_TABLET | Freq: Once | ORAL | Status: DC
  Filled 2012-02-19: qty 1

## 2012-02-19 MED ORDER — CARVEDILOL 12.5 MG PO TABS
12.5000 mg | ORAL_TABLET | Freq: Two times a day (BID) | ORAL | Status: DC
  Administered 2012-02-19 – 2012-02-21 (×4): 12.5 mg via ORAL
  Filled 2012-02-19 (×6): qty 1

## 2012-02-19 NOTE — Progress Notes (Addendum)
  VASCULAR AND VEIN SURGERY PROGRESS NOTE  POST-OP HEMODIALYSIS ACCESS  Date of Surgery: 02/17/2012 - 02/18/2012 Surgeon: Juliann Mule): Angelia Mould, MD 1 Day Post-Op Right Procedure(s): ARTERIOVENOUS (AV) FISTULA CREATION   HPI: Brian [name] is a 36 y.o. male who is 1 Day Post-Op creation/revision of right upper extremity Hemodialysis access. The patient denies symptoms of numbness; denies pain in the operative limb.   Significant Diagnostic Studies: CBC Lab Results  Component Value Date   WBC 6.2 02/19/2012   HGB 10.9* 02/19/2012   HCT 32.3* 02/19/2012   MCV 93.4 02/19/2012   PLT 143* 02/19/2012    BMET    Component Value Date/Time   NA 136 02/19/2012 0500   K 4.5 02/19/2012 0500   CL 109 02/19/2012 0500   CO2 18* 02/19/2012 0500   GLUCOSE 94 02/19/2012 0500   BUN 45* 02/19/2012 0500   CREATININE 3.86* 02/19/2012 0500   CREATININE 3.76* 12/26/2011 0859   CALCIUM 8.5 02/19/2012 0500   CALCIUM 8.4 10/14/2011 1432   GFRNONAA 18* 02/19/2012 0500   GFRAA 21* 02/19/2012 0500    COAG Lab Results  Component Value Date   INR 1.04 02/19/2012   INR 1.10 02/18/2012   INR 1.30 02/17/2012   No results found for this basename: PTT     I/O last 3 completed shifts: In: 1304.8 [P.O.:720; I.V.:584.8] Out: -   Physical Examination  Patient Vitals for the past 24 hrs:  BP Temp Temp src Pulse Resp SpO2 Height Weight  02/19/12 0531 122/91 mmHg 97.6 F (36.4 C) Oral 84  18  99 % - -  02/18/12 2115 137/96 mmHg 97.9 F (36.6 C) Oral 88  18  98 % 6\' 3"  (1.905 m) 211 lb 13.8 oz (96.1 kg)  02/18/12 1735 145/107 mmHg 97.7 F (36.5 C) Oral 78  17  98 % - -  02/18/12 1715 161/96 mmHg 97 F (36.1 C) - 81  11  97 % - -  02/18/12 1700 147/94 mmHg - - 74  12  98 % - -  02/18/12 1645 150/92 mmHg - - 69  8  99 % - -  02/18/12 1630 135/91 mmHg - - 72  9  98 % - -  02/18/12 1617 - - - 85  25  96 % - -  02/18/12 1616 122/94 mmHg - - - - - - -  02/18/12 1612 - 97.9 F (36.6 C) - - - - - -  02/18/12 1243 157/101  mmHg 98 F (36.7 C) Oral 92  21  92 % - -  02/18/12 0900 138/92 mmHg 97.8 F (36.6 C) Oral 77  20  98 % - -    right upper Incision is clean, dry, intact or healing well, skin color is normal, no cyanosis, jaundice, pallor or bruising, normal , hand grip is 5/5, sensation in digits is intact;  There is a good  thrill and good bruit in the right BC AV fistula.  Assessment/Plan Brian [name] is a 36 y.o. year old male who presents s/p creation/revision of right upper extremity Hemodialysis access. Follow-up in 4 weeks  The patient's access will be ready for use in 12 weeks. Pending INR 2.0-3.0 prior to D/C I ordered 10mg  po today. Laurence Slate [facility] 02/19/2012 8:04 AM    + thrill in fistula Incision clean Will d/c home when INR >2  Ruta Hinds, MD [facility] [place] Office: [phone] Pager: [phone]

## 2012-02-19 NOTE — Progress Notes (Signed)
ANTICOAGULATION CONSULT NOTE - Follow Up Consult  Pharmacy Consult for Heparin bridge to therapeutic INR with warfarin Indication: Hx PE  No Known Allergies  Patient Measurements: Height: 6\' 3"  (190.5 cm) Weight: 211 lb 13.8 oz (96.1 kg) (standing scale #1) IBW/kg (Calculated) : 84.5  Heparin Dosing Weight: 96.1 kg  Vital Signs: Temp: 97.6 F (36.4 C) (03/06 0531) Temp src: Oral (03/06 0531) BP: 122/91 mmHg (03/06 0531) Pulse Rate: 84  (03/06 0531)  Labs:  Basename 02/19/12 0500 02/18/12 0525 02/17/12 2241 02/17/12 1301  HGB 10.9* 12.4* -- --  HCT 32.3* 36.4* -- --  PLT 143* 177 -- --  APTT -- -- -- --  LABPROT 13.8 14.4 -- 16.4*  INR 1.04 1.10 -- 1.30  HEPARINUNFRC 0.31 0.41 0.27* --  CREATININE 3.86* 3.61* -- --  CKTOTAL -- -- -- --  CKMB -- -- -- --  TROPONINI -- -- -- --   Estimated Creatinine Clearance: 31.3 ml/min (by C-G formula based on Cr of 3.86).   Medications:  Prescriptions prior to admission  Medication Sig Dispense Refill  . amLODipine (NORVASC) 10 MG tablet Take 10 mg by mouth daily.        . diphenoxylate-atropine (LOMOTIL) 2.5-0.025 MG per tablet Take 1 tablet by mouth 2 (two) times daily.      [name] efavirenz (SUSTIVA) 600 MG tablet Take 600 mg by mouth at bedtime.      [name] emtricitabine-tenofovir (TRUVADA) 200-300 MG per tablet Take 1 tablet by mouth daily. Take one tablet on Mon, Wed, and Friday      . HYDROcodone-acetaminophen (VICODIN) 5-500 MG per tablet Take 1 tablet by mouth 2 (two) times daily as needed for pain.  60 tablet  0  . HYDROcodone-acetaminophen (VICODIN) 5-500 MG per tablet Take 1 tablet by mouth 2 (two) times daily as needed. For pain      . paricalcitol (ZEMPLAR) 1 MCG capsule Take 1 mcg by mouth daily.        [name] warfarin (COUMADIN) 5 MG tablet Take 10 mg by mouth daily. Last took on Saturday Night was told to stop for surgery       Scheduled:    . amLODipine  10 mg Oral Daily  . cefUROXime (ZINACEF)  IV  1.5 g Intravenous On Call  to OR  . Chlorhexidine Gluconate Cloth  6 each Topical Q0600  . diphenoxylate-atropine  1 tablet Oral BID  . efavirenz  600 mg Oral QHS  . emtricitabine-tenofovir  1 tablet Oral Q M,W,F  . HYDROmorphone      . mupirocin ointment  1 application Nasal BID  . nicotine  21 mg Transdermal Daily  . paricalcitol  1 mcg Oral Daily  . warfarin  10 mg Oral ONCE-1800  . warfarin  10 mg Oral ONCE-1800  . Warfarin - Pharmacist Dosing Inpatient   Does not apply q1800  . DISCONTD: emtricitabine-tenofovir  1 tablet Oral Daily  . DISCONTD: sodium chloride  3 mL Intravenous Q12H  . DISCONTD: warfarin  10 mg Oral Daily  . DISCONTD: Warfarin - Pharmacist Dosing Inpatient   Does not apply q1800    Assessment: 36 y.o. M to resume heparin bridge to therapeutic INR with warfarin for hx recurrent PE. The patient was on warfarin PTA at a dose of 10 mg daily. His last known dose was on 3/2 as he was instructed to hold for AVF placement on 3/5. Heparin was resumed on 3/5 by [facility] at a previously known therapeutic rate and is now  to be managed by pharmacy. Heparin level was therapeutic this a.m. (HL 0.31, goal of 0.3-0.7) and INR remains SUBtherapeutic after restarting warfarin last night (3/5) (INR 1.04, goal of 2-3)  Warfarin was resumed on 3/5, it will likely take several days to get patient therapeutic. Will boost warfarin today at 1.5x known home dose.  Goal of Therapy:  Heparin level 0.3-0.7, INR 2-3   Plan:  1. Warfarin 15 mg x 1 dose at 1800 today 2. Continue heparin at current drip rate of 1600 units/hr (16 ml/hr) 3. Will continue to monitor for any signs/symptoms of bleeding and will follow up with heparin level & PT/INR in the a.m.   Alycia Rossetti, PharmD, BCPS Clinical Pharmacist Pager: [phone] 02/19/2012 10:21 AM
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
## 2012-02-20 LAB — CBC
HCT: 31.2 % — ABNORMAL LOW (ref 39.0–52.0)
Hemoglobin: 10.6 g/dL — ABNORMAL LOW (ref 13.0–17.0)
MCHC: 34 g/dL (ref 30.0–36.0)
RBC: 3.32 MIL/uL — ABNORMAL LOW (ref 4.22–5.81)

## 2012-02-20 LAB — RENAL FUNCTION PANEL
CO2: 17 mEq/L — ABNORMAL LOW (ref 19–32)
Calcium: 7.7 mg/dL — ABNORMAL LOW (ref 8.4–10.5)
Creatinine, Ser: 3.9 mg/dL — ABNORMAL HIGH (ref 0.50–1.35)
GFR calc Af Amer: 21 mL/min — ABNORMAL LOW (ref >90)
Glucose, Bld: 87 mg/dL (ref 70–99)
Sodium: 136 mEq/L (ref 135–145)

## 2012-02-20 MED ORDER — WARFARIN SODIUM 7.5 MG PO TABS
15.0000 mg | ORAL_TABLET | Freq: Once | ORAL | Status: AC
  Administered 2012-02-20: 15 mg via ORAL
  Filled 2012-02-20: qty 2

## 2012-02-20 NOTE — Progress Notes (Signed)
Subjective: Interval History:  None POD2 post AVF with no steal Getting re-anticoagulated Objective:  Vital signs in last 24 hours:  Temp:  [97.9 F (36.6 C)-98.7 F (37.1 C)] 98.7 F (37.1 C) (03/07 0636) Pulse Rate:  [82-93] 93  (03/07 0900) Resp:  [19-20] 19  (03/07 0900) BP: (115-143)/(76-101) 132/97 mmHg (03/07 0900) SpO2:  [98 %] 98 % (03/07 0900) Weight change:   Intake/Output: I/O last 3 completed shifts: In: 1440 [P.O.:1440] Out: -   Exam unchanged, with continued good bruit and thrill in right AVF with warm hand, clear lungs, no rub, nontender abd and no edema lower ext's   Scheduled Medications Reviewed: . amLODipine  10 mg Oral Daily  . carvedilol  12.5 mg Oral BID WC  . Chlorhexidine Gluconate Cloth  6 each Topical Q0600  . diphenoxylate-atropine  1 tablet Oral BID  . efavirenz  600 mg Oral QHS  . emtricitabine-tenofovir  1 tablet Oral Q M,W,F  . mupirocin ointment  1 application Nasal BID  . nicotine  21 mg Transdermal Daily  . paricalcitol  1 mcg Oral Daily  . sodium bicarbonate  650 mg Oral TID  . warfarin  15 mg Oral ONCE-1800  . Warfarin - Pharmacist Dosing Inpatient   Does not apply q1800   Lab Results:  Basename 02/20/12 0640 02/19/12 0500 02/18/12 0525  WBC 5.2 6.2 7.2  HGB 10.6* 10.9* 12.4*  HCT 31.2* 32.3* 36.4*  PLT 133* 143* 177   BMET  Basename 02/20/12 0640 02/19/12 0500 02/18/12 0525  NA 136 136 136  K 4.8 4.5 4.6  CL 107 109 107  CO2 17* 18* 17*  GLUCOSE 87 94 87  BUN 48* 45* 47*  CREATININE 3.90* 3.86* 3.61*  CALCIUM 7.7* 8.5 8.5  PHOS 5.3* -- --   LFT  Basename 02/20/12 0640  PROT --  ALBUMIN 3.2*  AST --  ALT --  ALKPHOS --  BILITOT --  BILIDIR --  IBILI --   PT/INR  Basename 02/20/12 0640 02/19/12 0500  LABPROT 14.9 13.8  INR 1.15 1.04   Assessment/Plan: 1. CKD4  Not uremic. Does not need HD at present. Added oral sodium bicarb for acidosis  2. S/p left AVF per VVS POD2 3. HTN - not well controlled on  amlodipine monotherapy. Added carvedilol 12.5 BID (says needs med on the [facility] $4 list if he is to afford)Will adjust as needed. 4. HIV continue usual meds  5. Secondary HPT - continue zemplar 6. H/O DVT/PE - re-anticoagulation per primary svce.  7. Anemia - no need for ESA  8. EtOH and tobacco use - enforced abstinence in the hospital  9. Dispo - home when INR 2   LOS: 3 Lamar Meter B @TODAY @11 :02 AM

## 2012-02-20 NOTE — Progress Notes (Signed)
Assessment: 36 y.o. M to resume heparin bridge to therapeutic INR with warfarin for hx recurrent PE.   Anticoagulation: Hep bridge to therapeutic INR with warf for hx PE. HL 0.42, INR 1.15 << 1.04 << 1.1. H/H and Plt stable, no s/sx of bleeding noted. PTA dose was 10 mg daily. ---- coumadin 15mg  po x1; cont heparin at 1600 units/hr  Goal INR 2-3 Goal heparin level 0.3-0.7

## 2012-02-20 NOTE — Progress Notes (Signed)
[facility] [place]  Subjective  - POD #2 AVF   Objective 115/76 87 98.7 F (37.1 C) (Oral) 20 98%  Intake/Output Summary (Last 24 hours) at 02/20/12 0859 Last data filed at 02/20/12 [id number]  Gross per 24 hour  Intake   1200 ml  Output      0 ml  Net   1200 ml    Fistula with thrill   Assessment/Planning: DC home when INR 2    Brian [name] 02/20/2012 8:59 AM --  Laboratory Lab Results:  Basename 02/20/12 0640 02/19/12 0500  WBC 5.2 6.2  HGB 10.6* 10.9*  HCT 31.2* 32.3*  PLT 133* 143*   BMET  Basename 02/20/12 0640 02/19/12 0500  NA 136 136  K 4.8 4.5  CL 107 109  CO2 17* 18*  GLUCOSE 87 94  BUN 48* 45*  CREATININE 3.90* 3.86*  CALCIUM 7.7* 8.5    COAG Lab Results  Component Value Date   INR 1.15 02/20/2012   INR 1.04 02/19/2012   INR 1.10 02/18/2012   No results found for this basename: PTT    Antibiotics Anti-infectives     Start     Dose/Rate Route Frequency Ordered Stop   02/19/12 1000   emtricitabine-tenofovir (TRUVADA) 200-300 MG per tablet 1 tablet        1 tablet Oral Every M-W-F 02/18/12 1027     02/18/12 0600   cefUROXime (ZINACEF) 1.5 g in dextrose 5 % 50 mL IVPB        1.5 g 100 mL/hr over 30 Minutes Intravenous On call to O.R. 02/17/12 1153 02/18/12 1445   02/17/12 2200   efavirenz (SUSTIVA) tablet 600 mg        600 mg Oral Daily at bedtime 02/17/12 1404     02/17/12 1600   emtricitabine-tenofovir (TRUVADA) 200-300 MG per tablet 1 tablet  Status:  Discontinued        1 tablet Oral Daily 02/17/12 1404 02/18/12 1027

## 2012-02-21 LAB — RENAL FUNCTION PANEL
Albumin: 3.3 g/dL — ABNORMAL LOW (ref 3.5–5.2)
BUN: 50 mg/dL — ABNORMAL HIGH (ref 6–23)
Chloride: 106 mEq/L (ref 96–112)
GFR calc Af Amer: 21 mL/min — ABNORMAL LOW (ref >90)
GFR calc non Af Amer: 18 mL/min — ABNORMAL LOW (ref >90)
Potassium: 5.1 mEq/L (ref 3.5–5.1)
Sodium: 135 mEq/L (ref 135–145)

## 2012-02-21 LAB — PROTIME-INR: INR: 1.14 (ref 0.00–1.49)

## 2012-02-21 MED ORDER — CARVEDILOL 25 MG PO TABS
25.0000 mg | ORAL_TABLET | Freq: Two times a day (BID) | ORAL | Status: DC
  Administered 2012-02-21 – 2012-02-25 (×8): 25 mg via ORAL
  Filled 2012-02-21 (×10): qty 1

## 2012-02-21 MED ORDER — WARFARIN SODIUM 7.5 MG PO TABS
15.0000 mg | ORAL_TABLET | Freq: Once | ORAL | Status: AC
  Administered 2012-02-21: 15 mg via ORAL
  Filled 2012-02-21: qty 2

## 2012-02-21 NOTE — Progress Notes (Signed)
ANTICOAGULATION CONSULT NOTE - Follow Up Consult  Pharmacy Consult for Heparin / Coumadin Indication: hx PE  No Known Allergies  Patient Measurements: Height: 6\' 3"  (190.5 cm) Weight: 217 lb 2.5 oz (98.5 kg) (standing scale #1) IBW/kg (Calculated) : 84.5  Heparin Dosing Weight: 96.1 kg  Vital Signs: Temp: 98 F (36.7 C) (03/08 1000) Temp src: Oral (03/08 1000) BP: 144/92 mmHg (03/08 0836) Pulse Rate: 87  (03/08 0836)  Labs:  Basename 02/21/12 0540 02/20/12 0640 02/19/12 0500  HGB -- 10.6* 10.9*  HCT -- 31.2* 32.3*  PLT -- 133* 143*  APTT -- -- --  LABPROT 14.8 14.9 13.8  INR 1.14 1.15 1.04  HEPARINUNFRC 0.35 0.42 0.31  CREATININE 3.87* 3.90* 3.86*  CKTOTAL -- -- --  CKMB -- -- --  TROPONINI -- -- --   Estimated Creatinine Clearance: 31.2 ml/min (by C-G formula based on Cr of 3.87).   Medications:  Scheduled:    . amLODipine  10 mg Oral Daily  . carvedilol  12.5 mg Oral BID WC  . Chlorhexidine Gluconate Cloth  6 each Topical Q0600  . diphenoxylate-atropine  1 tablet Oral BID  . efavirenz  600 mg Oral QHS  . emtricitabine-tenofovir  1 tablet Oral Q M,W,F  . mupirocin ointment  1 application Nasal BID  . nicotine  21 mg Transdermal Daily  . paricalcitol  1 mcg Oral Daily  . sodium bicarbonate  650 mg Oral TID  . warfarin  15 mg Oral ONCE-1800  . Warfarin - Pharmacist Dosing Inpatient   Does not apply q1800   Infusions:    . sodium chloride 10 mL/hr (02/18/12 1831)  . heparin 1,600 Units/hr (02/21/12 0830)    Assessment: 36 yo M continues on therapeutic heparin bridging awaiting therapeutic INR on Coumadin.  Heparin level within goal.  INR essentially unchanged.    Goal of Therapy:  INR 2-3 Heparin level 0.3-0.7 units/ml   Plan:  Repeat Coumadin 15 mg PO x 1 tonight. Continue heparin at 1600 units/hr.   Daily heparin level, CBC, and PT/INR.  [employer], Pharm.D., BCPS Clinical Pharmacist Pager [phone]  02/21/2012,11:07 AM

## 2012-02-21 NOTE — Progress Notes (Signed)
Subjective: Interval History:  Feels fine Waiting on INR to increase Objective:  Vital signs in last 24 hours:  Temp:  [97.6 F (36.4 C)-98.2 F (36.8 C)] 98 F (36.7 C) (03/08 1000) Pulse Rate:  [75-87] 87  (03/08 0836) Resp:  [20] 20  (03/08 0523) BP: (119-150)/(84-92) 144/92 mmHg (03/08 0836) SpO2:  [96 %-99 %] 99 % (03/08 0523) Weight:  [98.5 kg (217 lb 2.5 oz)] 98.5 kg (217 lb 2.5 oz) (03/07 2150) Weight change:   Intake/Output: I/O last 3 completed shifts: In: 1995.3 [P.O.:1380; I.V.:615.3] Out: 2 [Stool:2]  Intake/Output this shift:   EXAM: Long haired gentleman NAD VS as above AVF right good bruit and thrill Clear lungs No rub Protuberant abdomen No edema   Lab Results:  Basename 02/20/12 0640 02/19/12 0500  WBC 5.2 6.2  HGB 10.6* 10.9*  HCT 31.2* 32.3*  PLT 133* 143*   BMET  Basename 02/21/12 0540 02/20/12 0640 02/19/12 0500  NA 135 136 136  K 5.1 4.8 4.5  CL 106 107 109  CO2 18* 17* 18*  GLUCOSE 86 87 94  BUN 50* 48* 45*  CREATININE 3.87* 3.90* 3.86*  CALCIUM 8.2* 7.7* 8.5  PHOS 5.1* 5.3* --   LFT  Basename 02/21/12 0540  PROT --  ALBUMIN 3.3*  AST --  ALT --  ALKPHOS --  BILITOT --  BILIDIR --  IBILI --   PT/INR  Basename 02/21/12 0540 02/20/12 0640  LABPROT 14.8 14.9  INR 1.14 1.15   Hepatitis Panel No results found for this basename: HEPBSAG,HCVAB,HEPAIGM,HEPBIGM in the last 72 hours PTH: Lab Results  Component Value Date   PTH 198.9* 10/14/2011   CALCIUM 8.2* 02/21/2012   CAION 1.06* 02/22/2011   PHOS 5.1* 02/21/2012  Assessment/Plan:  1. CKD4  Not uremic. Does not need HD at present. Added oral sodium bicarb for acidosis  2. S/p left AVF per VVS POD2  3. HTN - not well controlled on amlodipine monotherapy. Added carvedilol 12.5 BID (says needs med on the [facility] $4 list if he is to afford) Increase to 25 BID - BP better but not quite at goal 4. HIV continue usual meds  5. Secondary HPT - continue zemplar  6. H/O DVT/PE -  re-anticoagulation per primary svce.  7. Anemia - no need for ESA  8. EtOH and tobacco use - enforced abstinence in the hospital  9. Dispo - home when INR 2  Will see patient again on Monday.   LOS: 4 Brian [name] B @TODAY @2 :08 PM

## 2012-02-21 NOTE — Progress Notes (Addendum)
  VASCULAR AND VEIN SURGERY PROGRESS NOTE  Progress note  Date of Surgery: 02/17/2012 - 02/18/2012 Surgeon: Juliann Mule): Angelia Mould, MD 3 Days Post-Op Right Procedure(s): ARTERIOVENOUS (AV) FISTULA CREATION   HPI: Brian [name] is a 36 y.o. male AV fistula 02-18-12   Significant Diagnostic Studies: CBC    Component Value Date/Time   WBC 5.2 02/20/2012 0640   RBC 3.32* 02/20/2012 0640   HGB 10.6* 02/20/2012 0640   HCT 31.2* 02/20/2012 0640   PLT 133* 02/20/2012 0640   MCV 94.0 02/20/2012 0640   MCH 31.9 02/20/2012 0640   MCHC 34.0 02/20/2012 0640   RDW 12.8 02/20/2012 0640   LYMPHSABS 1.8 11/25/2011 0942   MONOABS 0.6 11/25/2011 0942   EOSABS 0.2 11/25/2011 0942   BASOSABS 0.0 11/25/2011 0942    BMET    Component Value Date/Time   NA 135 02/21/2012 0540   K 5.1 02/21/2012 0540   CL 106 02/21/2012 0540   CO2 18* 02/21/2012 0540   GLUCOSE 86 02/21/2012 0540   BUN 50* 02/21/2012 0540   CREATININE 3.87* 02/21/2012 0540   CREATININE 3.76* 12/26/2011 0859   CALCIUM 8.2* 02/21/2012 0540   CALCIUM 8.4 10/14/2011 1432   GFRNONAA 18* 02/21/2012 0540   GFRAA 21* 02/21/2012 0540    COAG Lab Results  Component Value Date   INR 1.14 02/21/2012   INR 1.15 02/20/2012   INR 1.04 02/19/2012   No results found for this basename: PTT     I/O last 3 completed shifts: In: 1995.3 [P.O.:1380; I.V.:615.3] Out: 2 [Stool:2]  Physical Examination  Patient Vitals for the past 24 hrs:  BP Temp Temp src Pulse Resp SpO2 Height Weight  02/21/12 0836 144/92 mmHg - - 87  - - - -  02/21/12 0523 135/84 mmHg 97.6 F (36.4 C) Oral 75  20  99 % - -  02/20/12 2150 150/84 mmHg 98.2 F (36.8 C) Oral 82  20  97 % 6\' 3"  (1.905 m) 217 lb 2.5 oz (98.5 kg)  02/20/12 1839 149/88 mmHg 97.7 F (36.5 C) Oral 86  20  96 % - -  02/20/12 1435 119/91 mmHg 97.8 F (36.6 C) Oral 83  20  97 % - -    Pt Incision is clean, dry, intact or healing well, skin color is normal, no cyanosis, jaundice, pallor or bruising, normal   Assessment/Plan  Brian [name] is a 37 y.o. year old male who is S/P Procedure(s):  ARTERIOVENOUS (AV) FISTULA CREATION Pending INR 2.0 or greater to D/C. Laurence Slate [facility] 02/21/2012 10:13 AM   Details as above home when INR therapeutic  Ruta Hinds, MD [facility] [place] Office: [phone] Pager: [phone]

## 2012-02-22 LAB — RENAL FUNCTION PANEL
Albumin: 3.8 g/dL (ref 3.5–5.2)
BUN: 52 mg/dL — ABNORMAL HIGH (ref 6–23)
Calcium: 8.8 mg/dL (ref 8.4–10.5)
Creatinine, Ser: 4.23 mg/dL — ABNORMAL HIGH (ref 0.50–1.35)
Phosphorus: 5 mg/dL — ABNORMAL HIGH (ref 2.3–4.6)

## 2012-02-22 LAB — PROTIME-INR
INR: 1.46 (ref 0.00–1.49)
Prothrombin Time: 18 seconds — ABNORMAL HIGH (ref 11.6–15.2)

## 2012-02-22 MED ORDER — WARFARIN SODIUM 7.5 MG PO TABS
15.0000 mg | ORAL_TABLET | Freq: Once | ORAL | Status: AC
  Administered 2012-02-22: 15 mg via ORAL
  Filled 2012-02-22: qty 2

## 2012-02-22 NOTE — Progress Notes (Signed)
ANTICOAGULATION CONSULT NOTE - Follow Up Consult  Pharmacy Consult for Heparin / Coumadin Indication: hx PE  Assessment: 36 yo male continues on IV heparin bridging awaiting therapeutic INR on Coumadin (hx/o recurrent PE).  Heparin level remains therapeutic at 0.42.  INR increased to 1.46-->1.14, but remains subtherapeutic. No bleeding issues reported.  Home coumadin dose: 10mg  daily  Pharmacy System-Based Medication Review: Infectious Disease: Hx/o HIV - On truvada (adjusted for renal function), sustiva. Viral load undetectable, CD4 840 in Dec '12 Afebrile, WBC ok Cardiovascular: Hx HTN - hypertensive, on norvasc, coreg  Nephrology: Hx CKD IV (baseline SCr 3.6-3.8) d/t polycystic kidney disease now s/p AVF placement on 3/5. SCr at baseline, CrCl~25-35 ml/min. Phos 5.1, Ca(uncorrected)~8.2. On zemplar  Hematology / Oncology: H/H 10.6/31.2 and Plt 133 (stable) Best Practices: Hep/Warf for VTE px, no SUP needed, home meds addressed  Goal of Therapy:  INR 2-3 Heparin level 0.3-0.7 units/ml   Plan:  -Continue heparin IV rate at 1600 units/hr -Coumadin 15 mg PO x 1 tonight. -Check INR & HL in AM -Daily CBC & HL  Brian [name]. Brian [name], PharmD  02/22/2012 9:58 AM   No Known Allergies  Patient Measurements: Height: 6\' 3"  (190.5 cm) Weight: 217 lb 2.5 oz (98.5 kg) (standing scale #1) IBW/kg (Calculated) : 84.5  Heparin Dosing Weight: 96.1 kg  Vital Signs: Temp: 98 F (36.7 C) (03/09 0930) Temp src: Oral (03/09 0930) BP: 142/93 mmHg (03/09 0930) Pulse Rate: 94  (03/09 0930)  Labs:  Basename 02/22/12 0820 02/21/12 0540 02/20/12 0640  HGB -- -- 10.6*  HCT -- -- 31.2*  PLT -- -- 133*  APTT -- -- --  LABPROT 18.0* 14.8 14.9  INR 1.46 1.14 1.15  HEPARINUNFRC 0.42 0.35 0.42  CREATININE -- 3.87* 3.90*  CKTOTAL -- -- --  CKMB -- -- --  TROPONINI -- -- --   Estimated Creatinine Clearance: 31.2 ml/min (by C-G formula based on Cr of 3.87).   Medications:  Scheduled:     .  amLODipine  10 mg Oral Daily  . carvedilol  25 mg Oral BID WC  . Chlorhexidine Gluconate Cloth  6 each Topical Q0600  . diphenoxylate-atropine  1 tablet Oral BID  . efavirenz  600 mg Oral QHS  . emtricitabine-tenofovir  1 tablet Oral Q M,W,F  . mupirocin ointment  1 application Nasal BID  . nicotine  21 mg Transdermal Daily  . paricalcitol  1 mcg Oral Daily  . sodium bicarbonate  650 mg Oral TID  . warfarin  15 mg Oral ONCE-1800  . Warfarin - Pharmacist Dosing Inpatient   Does not apply q1800  . DISCONTD: carvedilol  12.5 mg Oral BID WC   Infusions:     . sodium chloride 10 mL/hr (02/18/12 1831)  . heparin 1,600 Units/hr (02/21/12 0830)

## 2012-02-22 NOTE — Progress Notes (Signed)
[facility] [place]  Daily Progress Note  Assessment/Planning: POD #4 s/p R BC AVF   Awaiting therapeutic INR given this pt's financial limitations  AM INR pending: doubt it will be therapeutic given the previous trend  Subjective  - 4 Days Post-Op  "Want to go home"  Objective Filed Vitals:   02/21/12 1731 02/21/12 1800 02/21/12 2141 02/22/12 0612  BP: 159/92 133/85 133/87 134/68  Pulse: 80 88 75 75  Temp:  98.1 F (36.7 C) 97.7 F (36.5 C) 97.6 F (36.4 C)  TempSrc:  Oral Oral Oral  Resp:  18 19 19   Height:      Weight:      SpO2:  96% 95% 95%    Intake/Output Summary (Last 24 hours) at 02/22/12 0809 Last data filed at 02/21/12 1900  Gross per 24 hour  Intake    720 ml  Output      0 ml  Net    720 ml    PULM  CTAB CV  RRR GI  soft, NTND VASC  R antecubital incision c/d/i, warm hand, motor intact, sensation grossly intact  Laboratory CBC    Component Value Date/Time   WBC 5.2 02/20/2012 0640   HGB 10.6* 02/20/2012 0640   HCT 31.2* 02/20/2012 0640   PLT 133* 02/20/2012 0640    BMET    Component Value Date/Time   NA 135 02/21/2012 0540   K 5.1 02/21/2012 0540   CL 106 02/21/2012 0540   CO2 18* 02/21/2012 0540   GLUCOSE 86 02/21/2012 0540   BUN 50* 02/21/2012 0540   CREATININE 3.87* 02/21/2012 0540   CREATININE 3.76* 12/26/2011 0859   CALCIUM 8.2* 02/21/2012 0540   CALCIUM 8.4 10/14/2011 1432   GFRNONAA 18* 02/21/2012 0540   GFRAA 21* 02/21/2012 0540   Lab Results  Component Value Date   INR 1.14 02/21/2012   INR 1.15 02/20/2012   INR 1.04 02/19/2012    Adele Barthel, MD [facility] [place]: [phone] Pager: [phone]  02/22/2012, 8:09 AM

## 2012-02-23 LAB — BASIC METABOLIC PANEL
BUN: 54 mg/dL — ABNORMAL HIGH (ref 6–23)
Chloride: 106 mEq/L (ref 96–112)
Glucose, Bld: 118 mg/dL — ABNORMAL HIGH (ref 70–99)
Potassium: 5.6 mEq/L — ABNORMAL HIGH (ref 3.5–5.1)
Sodium: 135 mEq/L (ref 135–145)

## 2012-02-23 MED ORDER — SODIUM POLYSTYRENE SULFONATE 15 GM/60ML PO SUSP
30.0000 g | Freq: Once | ORAL | Status: AC
  Administered 2012-02-23: 30 g via ORAL
  Filled 2012-02-23: qty 120

## 2012-02-23 MED ORDER — WARFARIN SODIUM 7.5 MG PO TABS
15.0000 mg | ORAL_TABLET | Freq: Once | ORAL | Status: AC
  Administered 2012-02-23: 15 mg via ORAL
  Filled 2012-02-23: qty 2

## 2012-02-23 NOTE — Progress Notes (Signed)
[facility] [place]  Daily Progress Note  Assessment/Planning: POD #5 s/p R BC AVF   Awaiting therapeutic INR given this pt's financial limitations: probably 1-2 more days  Subjective  - 5 Days Post-Op  Sleepy  Objective Filed Vitals:   02/22/12 1316 02/22/12 1800 02/22/12 2129 02/23/12 0604  BP: 123/86 128/64 123/84 131/73  Pulse: 86 84 76 76  Temp: 98.2 F (36.8 C) 98.1 F (36.7 C) 97.7 F (36.5 C) 97.4 F (36.3 C)  TempSrc: Oral Oral Oral Oral  Resp: 18 19 20 18   Height:      Weight:   219 lb 2.2 oz (99.4 kg)   SpO2: 94% 95% 96% 99%    Intake/Output Summary (Last 24 hours) at 02/23/12 1007 Last data filed at 02/23/12 0500  Gross per 24 hour  Intake    930 ml  Output      0 ml  Net    930 ml    PULM  CTAB CV  RRR GI  soft, NTND VASC R antecubital incision c/d/i, warm hand, motor intact, sensation grossly intact  Laboratory CBC    Component Value Date/Time   WBC 5.2 02/20/2012 0640   HGB 10.6* 02/20/2012 0640   HCT 31.2* 02/20/2012 0640   PLT 133* 02/20/2012 0640    BMET    Component Value Date/Time   NA 136 02/22/2012 0928   K 5.4* 02/22/2012 0928   CL 105 02/22/2012 0928   CO2 19 02/22/2012 0928   GLUCOSE 129* 02/22/2012 0928   BUN 52* 02/22/2012 0928   CREATININE 4.23* 02/22/2012 0928   CREATININE 3.76* 12/26/2011 0859   CALCIUM 8.8 02/22/2012 0928   CALCIUM 8.4 10/14/2011 1432   GFRNONAA 17* 02/22/2012 0928   GFRAA 19* 02/22/2012 0928   INR Lab Results  Component Value Date   INR 1.61* 02/23/2012   INR 1.46 02/22/2012   INR 1.14 02/21/2012    Adele Barthel, MD [facility] Office: [phone] Pager: [phone]  02/23/2012, 10:07 AM

## 2012-02-23 NOTE — Progress Notes (Signed)
ANTICOAGULATION CONSULT NOTE - Follow Up Consult  Pharmacy Consult for Heparin / Coumadin Indication: hx PE  Assessment: 36 yo male continues on IV heparin bridging awaiting therapeutic INR on Coumadin (hx/o recurrent PE).  Heparin level today is slightly subtherapeutic at 0.28.  INR increased to 1.61-->1.46, but remains subtherapeutic. No bleeding issues reported.  Home coumadin dose: 10mg  daily  Pharmacy System-Based Medication Review: Infectious Disease: Hx/o HIV - On truvada (adjusted for renal function), sustiva. Viral load undetectable, CD4 840 in Dec '12 Afebrile, WBC ok Cardiovascular: Hx HTN - hypertensive, on norvasc, coreg Nephrology: Hx CKD IV (baseline SCr 3.6-3.8) d/t polycystic kidney disease now s/p AVF placement on 3/5. SCr up again to 4.23, CrCl~25-35 ml/min. Phos 5.1, Ca(corrected)~8.96. On zemplar, hyperkalemia Hematology / Oncology: H/H 10.6/31.2 and Plt 133 (stable) Best Practices: Hep/Warf for VTE px, no SUP needed, home meds addressed  Goal of Therapy:  INR 2-3 Heparin level 0.3-0.7 units/ml   Plan:  -Increase heparin IV rate to 1800 units/hr -Coumadin 15 mg PO x 1 tonight. -Check INR & HL in AM -Daily CBC & HL  Meriam Sprague. Marianna Fuss, PharmD  02/23/2012 8:07 AM   No Known Allergies  Patient Measurements: Height: 6\' 3"  (190.5 cm) Weight: 219 lb 2.2 oz (99.4 kg) IBW/kg (Calculated) : 84.5  Heparin Dosing Weight: 96.1 kg  Vital Signs: Temp: 97.4 F (36.3 C) (03/10 0604) Temp src: Oral (03/10 0604) BP: 131/73 mmHg (03/10 0604) Pulse Rate: 76  (03/10 0604)  Labs:  Basename 02/23/12 0630 02/22/12 0928 02/22/12 0820 02/21/12 0540  HGB -- -- -- --  HCT -- -- -- --  PLT -- -- -- --  APTT -- -- -- --  LABPROT 19.4* -- 18.0* 14.8  INR 1.61* -- 1.46 1.14  HEPARINUNFRC 0.28* -- 0.42 0.35  CREATININE -- 4.23* -- 3.87*  CKTOTAL -- -- -- --  CKMB -- -- -- --  TROPONINI -- -- -- --   Estimated Creatinine Clearance: 28.6 ml/min (by C-G formula based on Cr  of 4.23).   Medications:  Scheduled:     . amLODipine  10 mg Oral Daily  . carvedilol  25 mg Oral BID WC  . Chlorhexidine Gluconate Cloth  6 each Topical Q0600  . diphenoxylate-atropine  1 tablet Oral BID  . efavirenz  600 mg Oral QHS  . emtricitabine-tenofovir  1 tablet Oral Q M,W,F  . mupirocin ointment  1 application Nasal BID  . nicotine  21 mg Transdermal Daily  . paricalcitol  1 mcg Oral Daily  . sodium bicarbonate  650 mg Oral TID  . warfarin  15 mg Oral ONCE-1800  . Warfarin - Pharmacist Dosing Inpatient   Does not apply q1800   Infusions:     . sodium chloride 10 mL/hr (02/22/12 2232)  . heparin 1,600 Units/hr (02/22/12 1745)

## 2012-02-24 LAB — RENAL FUNCTION PANEL
Albumin: 3.2 g/dL — ABNORMAL LOW (ref 3.5–5.2)
CO2: 18 mEq/L — ABNORMAL LOW (ref 19–32)
Calcium: 8.3 mg/dL — ABNORMAL LOW (ref 8.4–10.5)
Chloride: 107 mEq/L (ref 96–112)
GFR calc Af Amer: 18 mL/min — ABNORMAL LOW (ref >90)
GFR calc non Af Amer: 15 mL/min — ABNORMAL LOW (ref >90)
Sodium: 135 mEq/L (ref 135–145)

## 2012-02-24 LAB — PROTIME-INR: INR: 1.85 — ABNORMAL HIGH (ref 0.00–1.49)

## 2012-02-24 LAB — HEPARIN LEVEL (UNFRACTIONATED): Heparin Unfractionated: 0.57 IU/mL (ref 0.30–0.70)

## 2012-02-24 LAB — CBC
Hemoglobin: 11.5 g/dL — ABNORMAL LOW (ref 13.0–17.0)
MCH: 31.7 pg (ref 26.0–34.0)
MCHC: 33.3 g/dL (ref 30.0–36.0)
Platelets: 154 10*3/uL (ref 150–400)
RBC: 3.63 MIL/uL — ABNORMAL LOW (ref 4.22–5.81)

## 2012-02-24 MED ORDER — SODIUM POLYSTYRENE SULFONATE 15 GM/60ML PO SUSP
30.0000 g | Freq: Once | ORAL | Status: AC
  Administered 2012-02-24: 30 g via ORAL
  Filled 2012-02-24: qty 120

## 2012-02-24 MED ORDER — FUROSEMIDE 80 MG PO TABS
80.0000 mg | ORAL_TABLET | Freq: Two times a day (BID) | ORAL | Status: DC
  Administered 2012-02-24 – 2012-02-25 (×2): 80 mg via ORAL
  Filled 2012-02-24 (×4): qty 1

## 2012-02-24 MED ORDER — WARFARIN SODIUM 10 MG PO TABS
17.5000 mg | ORAL_TABLET | Freq: Once | ORAL | Status: AC
  Administered 2012-02-24: 17.5 mg via ORAL
  Filled 2012-02-24: qty 1

## 2012-02-24 NOTE — Progress Notes (Signed)
[facility] Progress Note Subjective:  I just can'[name] eat a renal diet. There is nothing to eat.  Trying to get disability. Reports am nausea; vomits and then is ok. He pays cash for all meds  Objective Filed Vitals:   02/23/12 1826 02/23/12 2148 02/24/12 0555 02/24/12 1000  BP: 132/28 136/99 120/82 131/82  Pulse: 76 83 70 81  Temp: 97.6 F (36.4 C) 97.9 F (36.6 C) 97.4 F (36.3 C) 97.6 F (36.4 C)  TempSrc: Oral Oral Oral Oral  Resp: 19 18 18 18   Height:      Weight:  97.705 kg (215 lb 6.4 oz)    SpO2: 99% 99% 98% 96%   Physical Exam: General:  NAD sitting in bed watching TV Heart: RRR no rub Lungs: CTA Abdomen: protuberant + BS Extremities: tr LE edema Dialysis Access: left upper avf strong thrill and bruit Neuro: no asterixis  Problem/Plan: 1. CKD 4- CO2 not improved with addition of Na bicarbonate - will re-evaluate in the am to see if we need to increase dose; K 5.2; BUN/Cr stable; to follow up with Dr. Moshe [name] after d/c; has am nausea but not overtly uremic 2.- s/p left AVF POD 3 per [facility] 3. Anemia - Hgb 11.5 without ESA 4. Secondary hyperparathyroidism - phos slightly elevated; if this persists will add binder; on zemplar 5. HTN/volume - BP borderline goal, overall better than yesterday with increase in carvedilol to 25 bid; also on norvasc 10 6. Nutrition - on regular diet; really thinks he can'[name] eat renal diet; agreed to low K diet; consult for diet education 7. HIV - usual meds 8, Etoh and tobacco use - enforced abstinence in the hospital; tells me he drinks a pint every so often at baseline and 1/2 ppd 9. H/O DVT/PE - re-anticoagulation per primary svce; home when INR 2; 1.85 today; on relatively high dose as an outpt..  Additional Objective Labs: Basic Metabolic Panel:  Lab [id number] 02/23/12 1130 02/22/12 0928 02/21/12 0540  NA 135 135 136 --  K 5.2* 5.6* 5.4* --  CL 107 106 105 --  CO2 18* 20 19 --  GLUCOSE 83 118* 129* --  BUN 53*  54* 52* --  CREATININE 4.48* 4.48* 4.23* --  CALCIUM 8.3* 8.5 8.8 --  ALB -- -- -- --  PHOS 6.0* -- 5.0* 5.1*   Liver Function Tests:  Lab 02/24/12 0645 02/22/12 0928 02/21/12 0540  AST -- -- --  ALT -- -- --  ALKPHOS -- -- --  BILITOT -- -- --  PROT -- -- --  ALBUMIN 3.2* 3.8 3.3*  INR: Lab Results  Component Value Date   INR 1.85* 02/24/2012   INR 1.61* 02/23/2012   INR 1.46 02/22/2012    CBC:  Lab 02/24/12 1251 02/20/12 0640 02/19/12 0500 02/18/12 0525  WBC 4.9 5.2 6.2 --  NEUTROABS -- -- -- --  HGB 11.5* 10.6* 10.9* --  HCT 34.5* 31.2* 32.3* --  MCV 95.0 94.0 93.4 94.1  PLT 154 133* 143* --  CBG:  Lab 02/19/12 0742 02/18/12 1205 02/18/12 0749  GLUCAP 95 92 93  Medications:    . sodium chloride 10 mL/hr (02/24/12 1025)  . heparin 1,800 Units/hr (02/24/12 0135)      . amLODipine  10 mg Oral Daily  . carvedilol  25 mg Oral BID WC  . diphenoxylate-atropine  1 tablet Oral BID  . efavirenz  600 mg Oral QHS  . emtricitabine-tenofovir  1 tablet Oral Q M,W,F  .  nicotine  21 mg Transdermal Daily  . paricalcitol  1 mcg Oral Daily  . sodium bicarbonate  650 mg Oral TID  . sodium polystyrene  30 g Oral Once  . warfarin  15 mg Oral ONCE-1800  . warfarin  17.5 mg Oral ONCE-1800  . Warfarin - Pharmacist Dosing Inpatient   Does not apply q1800    I  have reviewed scheduled and prn medications.  Brian Jacobson, PA-C [place] [phone]  02/24/2012,2:02 PM  LOS: 7 days  I have seen and examined this patient and agree with plan .  He is asking for another dose of kayexalate as it helped his diarrhea.  Will give as K 5.2.  Will also start lasix to help with k excretion. Brian [name],Brian T,MD 02/24/2012 3:20 PM

## 2012-02-24 NOTE — Progress Notes (Signed)
Utilization review completed. Rozanna Boer, RN, BSN. 02/24/12

## 2012-02-24 NOTE — Plan of Care (Addendum)
Problem: Food- and Nutrition-Related Knowledge Deficit (NB-1.1) Goal: Nutrition education Formal process to instruct or train a patient/client in a skill or to impart knowledge to help patients/clients voluntarily manage or modify food choices and eating behavior to maintain or improve health.  Outcome: Completed/Met Date Met:  02/24/12 RD consult for Low Potassium diet education. Provided Food Pyramid for Healthy Eating with Kidney Disease handout; reviewed high potassium foods to limit. Questions answered. Reports a good appetite; was consuming 100% on a Regular diet prior to potassium restriction change. BMI = 27.2 kg/m2. No further nutrition intervention indicated at this time.  Lady Deutscher, RD, LDN Pager #: [phone]

## 2012-02-24 NOTE — Progress Notes (Signed)
CARE MANAGEMENT NOTE 02/24/2012   Financial counselor assisted patient with application for medicaid, now waiting for ID number.Will follow.

## 2012-02-24 NOTE — Progress Notes (Addendum)
  VASCULAR AND VEIN SURGERY PROGRESS NOTE  Progress note  Date of Surgery: 02/17/2012 - 02/18/2012  Surgeon: Juliann Mule): Angelia Mould, MD 6 Days Post-Op Right Procedure(s): ARTERIOVENOUS (AV) FISTULA CREATION   HPI: Brian [name] is a 36 y.o. male CKD.   Significant Diagnostic Studies: CBC    Component Value Date/Time   WBC 5.2 02/20/2012 0640   RBC 3.32* 02/20/2012 0640   HGB 10.6* 02/20/2012 0640   HCT 31.2* 02/20/2012 0640   PLT 133* 02/20/2012 0640   MCV 94.0 02/20/2012 0640   MCH 31.9 02/20/2012 0640   MCHC 34.0 02/20/2012 0640   RDW 12.8 02/20/2012 0640   LYMPHSABS 1.8 11/25/2011 0942   MONOABS 0.6 11/25/2011 0942   EOSABS 0.2 11/25/2011 0942   BASOSABS 0.0 11/25/2011 0942    BMET    Component Value Date/Time   NA 135 02/23/2012 1130   K 5.6* 02/23/2012 1130   CL 106 02/23/2012 1130   CO2 20 02/23/2012 1130   GLUCOSE 118* 02/23/2012 1130   BUN 54* 02/23/2012 1130   CREATININE 4.48* 02/23/2012 1130   CREATININE 3.76* 12/26/2011 0859   CALCIUM 8.5 02/23/2012 1130   CALCIUM 8.4 10/14/2011 1432   GFRNONAA 15* 02/23/2012 1130   GFRAA 18* 02/23/2012 1130    COAG Lab Results  Component Value Date   INR 1.61* 02/23/2012   INR 1.46 02/22/2012   INR 1.14 02/21/2012   No results found for this basename: PTT     I/O last 3 completed shifts: In: 1719.6 [P.O.:720; I.V.:999.6] Out: -   Physical Examination  Patient Vitals for the past 24 hrs:  BP Temp Temp src Pulse Resp SpO2 Weight  02/24/12 0555 120/82 mmHg 97.4 F (36.3 C) Oral 70  18  98 % -  02/23/12 2148 136/99 mmHg 97.9 F (36.6 C) Oral 83  18  99 % 215 lb 6.4 oz (97.705 kg)  02/23/12 1826 132/28 mmHg 97.6 F (36.4 C) Oral 76  19  99 % -  02/23/12 1245 129/90 mmHg 97.8 F (36.6 C) Oral 90  18  99 % -  02/23/12 1028 129/84 mmHg 97.8 F (36.6 C) Oral 77  19  98 % -    Pt Incision is clean, dry, intact or healing well, skin color is normal, no cyanosis, jaundice, pallor or bruising, normal thrill palpated at AV  fistula site.  Assessment/Plan  Brian [name] is a 36 y.o. year old male who is S/P Procedure(s):  ARTERIOVENOUS (AV) FISTULA CREATION Pending INR 2.0-3.0 prior to D/C Brian [name] [facility] 02/24/2012 7:36 AM   +thrill in fistula Should be able to go home tomorrow Needs follow up appt 4-6 weeks  Brian Hinds, MD [facility] [place]: [phone] Pager: [phone]

## 2012-02-24 NOTE — Progress Notes (Signed)
02/24/12  1643 Case Manager Note: Received call from [place] counselor- pt has been issued a medicaid number- [id number] Q.

## 2012-02-24 NOTE — Progress Notes (Signed)
ANTICOAGULATION CONSULT NOTE - Follow Up Consult  Pharmacy Consult for Heparin / Coumadin Indication: hx PE  Assessment: 36 yo male continues on IV heparin bridging awaiting therapeutic INR on Coumadin (hx/o recurrent PE).  Heparin level today is therapuetic at 0.57.  INR rising steadily (1.85), but remains subtherapeutic. No bleeding issues reported.  Home coumadin dose: 10mg  daily  Goal of Therapy:  INR 2-3 Heparin level 0.3-0.7 units/ml   Plan:  -Continue IV heparin at 1800 units/hr -Coumadin 17.5 mg PO x 1 tonight. -Check INR & HL in AM -Daily CBC & HL *MD - could check with case management, may qualify for 1-2 doses of Lovenox through patient assistance program.  Would need Lovenox 100 mg SQ q 24 hrs.  Likely to only need 1-2 doses until INR > 2.  Spoke to Dr. Elie Confer in IM clinic, he would prefer to see him this Thursday 3/14 for INR check.  Thanks!  [place] Crane, Pharm D 02/24/2012 11:39 AM    No Known Allergies  Patient Measurements: Height: 6\' 3"  (190.5 cm) Weight: 215 lb 6.4 oz (97.705 kg) (standing scale) IBW/kg (Calculated) : 84.5  Heparin Dosing Weight: 96.1 kg  Vital Signs: Temp: 97.6 F (36.4 C) (03/11 1000) Temp src: Oral (03/11 1000) BP: 131/82 mmHg (03/11 1000) Pulse Rate: 81  (03/11 1000)  Labs:  Basename 02/24/12 0645 02/23/12 1130 02/23/12 0630 02/22/12 0928 02/22/12 0820  HGB -- -- -- -- --  HCT -- -- -- -- --  PLT -- -- -- -- --  APTT -- -- -- -- --  LABPROT 21.7* -- 19.4* -- 18.0*  INR 1.85* -- 1.61* -- 1.46  HEPARINUNFRC 0.57 -- 0.28* -- 0.42  CREATININE 4.48* 4.48* -- 4.23* --  CKTOTAL -- -- -- -- --  CKMB -- -- -- -- --  TROPONINI -- -- -- -- --   Estimated Creatinine Clearance: 27 ml/min (by C-G formula based on Cr of 4.48).   Medications:  Scheduled:     . amLODipine  10 mg Oral Daily  . carvedilol  25 mg Oral BID WC  . diphenoxylate-atropine  1 tablet Oral BID  . efavirenz  600 mg Oral QHS  . emtricitabine-tenofovir  1  tablet Oral Q M,W,F  . nicotine  21 mg Transdermal Daily  . paricalcitol  1 mcg Oral Daily  . sodium bicarbonate  650 mg Oral TID  . sodium polystyrene  30 g Oral Once  . warfarin  15 mg Oral ONCE-1800  . Warfarin - Pharmacist Dosing Inpatient   Does not apply q1800   Infusions:     . sodium chloride 10 mL/hr (02/24/12 1025)  . heparin 1,800 Units/hr (02/24/12 0135)

## 2012-02-25 LAB — CBC
HCT: 31.4 % — ABNORMAL LOW (ref 39.0–52.0)
Hemoglobin: 10.6 g/dL — ABNORMAL LOW (ref 13.0–17.0)
MCHC: 33.8 g/dL (ref 30.0–36.0)
MCV: 94 fL (ref 78.0–100.0)
RDW: 12.9 % (ref 11.5–15.5)

## 2012-02-25 LAB — RENAL FUNCTION PANEL
Albumin: 3.6 g/dL (ref 3.5–5.2)
BUN: 56 mg/dL — ABNORMAL HIGH (ref 6–23)
CO2: 16 mEq/L — ABNORMAL LOW (ref 19–32)
Calcium: 8.2 mg/dL — ABNORMAL LOW (ref 8.4–10.5)
Chloride: 104 mEq/L (ref 96–112)
Creatinine, Ser: 4.48 mg/dL — ABNORMAL HIGH (ref 0.50–1.35)
GFR calc Af Amer: 18 mL/min — ABNORMAL LOW (ref >90)
GFR calc non Af Amer: 15 mL/min — ABNORMAL LOW (ref >90)
Glucose, Bld: 85 mg/dL (ref 70–99)
Phosphorus: 6.7 mg/dL — ABNORMAL HIGH (ref 2.3–4.6)
Potassium: 4.7 mEq/L (ref 3.5–5.1)
Sodium: 134 mEq/L — ABNORMAL LOW (ref 135–145)

## 2012-02-25 MED ORDER — SODIUM BICARBONATE 650 MG PO TABS: 1300.0000 mg | ORAL_TABLET | Freq: Two times a day (BID) | ORAL | Status: DC

## 2012-02-25 MED ORDER — SODIUM BICARBONATE 650 MG PO TABS
1300.0000 mg | ORAL_TABLET | Freq: Two times a day (BID) | ORAL | Status: DC
  Filled 2012-02-25: qty 2

## 2012-02-25 MED ORDER — CARVEDILOL 25 MG PO TABS: 25.0000 mg | ORAL_TABLET | Freq: Two times a day (BID) | ORAL | Status: DC

## 2012-02-25 MED ORDER — FUROSEMIDE 80 MG PO TABS: 80.0000 mg | ORAL_TABLET | Freq: Two times a day (BID) | ORAL | Status: DC

## 2012-02-25 MED ORDER — CALCIUM CARBONATE ANTACID 500 MG PO CHEW
400.0000 mg | CHEWABLE_TABLET | Freq: Three times a day (TID) | ORAL | Status: DC
  Filled 2012-02-25 (×3): qty 2

## 2012-02-25 MED ORDER — CALCIUM CARBONATE ANTACID 500 MG PO CHEW: 2.0000 | CHEWABLE_TABLET | Freq: Three times a day (TID) | ORAL | Status: DC
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
## 2012-02-25 NOTE — Progress Notes (Signed)
INR >2 Send home today Follow up 4-6 weeks to recheck fistula  Ruta Hinds, MD [facility] [place] Office: [phone] Pager: [phone]

## 2012-02-25 NOTE — Progress Notes (Signed)
S:no new CO O:BP 121/88  Pulse 88  Temp(Src) 98 F (36.7 C) (Oral)  Resp 19  Ht 6\' 3"  (1.905 m)  Wt 98.2 kg (216 lb 7.9 oz)  BMI 27.06 kg/m2  SpO2 96%  Intake/Output Summary (Last 24 hours) at 02/25/12 1133 Last data filed at 02/25/12 0859  Gross per 24 hour  Intake    960 ml  Output      0 ml  Net    960 ml   Weight change: 0.495 kg (1 lb 1.5 oz) IN:[id number] and alert CVS:RRR Resp:Clear [id number] Ext:no edema  + bruit AVF NEURO:CNI no asterixis      . amLODipine  10 mg Oral Daily  . calcium carbonate  400 mg of elemental calcium Oral TID WC  . carvedilol  25 mg Oral BID WC  . efavirenz  600 mg Oral QHS  . emtricitabine-tenofovir  1 tablet Oral Q M,W,F  . furosemide  80 mg Oral BID  . nicotine  21 mg Transdermal Daily  . paricalcitol  1 mcg Oral Daily  . sodium bicarbonate  1,300 mg Oral BID  . sodium polystyrene  30 g Oral Once  . warfarin  17.5 mg Oral ONCE-1800  . Warfarin - Pharmacist Dosing Inpatient   Does not apply q1800  . DISCONTD: diphenoxylate-atropine  1 tablet Oral BID  . DISCONTD: sodium bicarbonate  650 mg Oral TID   No results found. BMET    Component Value Date/Time   NA 134* 02/25/2012 0501   K 4.7 02/25/2012 0501   CL 104 02/25/2012 0501   CO2 16* 02/25/2012 0501   GLUCOSE 85 02/25/2012 0501   BUN 56* 02/25/2012 0501   CREATININE 4.48* 02/25/2012 0501   CREATININE 3.76* 12/26/2011 0859   CALCIUM 8.2* 02/25/2012 0501   CALCIUM 8.4 10/14/2011 1432   GFRNONAA 15* 02/25/2012 0501   GFRAA 18* 02/25/2012 0501   CBC    Component Value Date/Time   WBC 4.7 02/25/2012 0500   RBC 3.34* 02/25/2012 0500   HGB 10.6* 02/25/2012 0500   HCT 31.4* 02/25/2012 0500   PLT 138* 02/25/2012 0500   MCV 94.0 02/25/2012 0500   MCH 31.7 02/25/2012 0500   MCHC 33.8 02/25/2012 0500   RDW 12.9 02/25/2012 0500   LYMPHSABS 1.8 11/25/2011 0942   MONOABS 0.6 11/25/2011 0942   EOSABS 0.2 11/25/2011 0942   BASOSABS 0.0 11/25/2011 0942     Assessment:  1. CKD 5, stable 2.  HTN   Plan: Given script for lasix. Leave off coreg for now and follow BP as lasix may help   Brian [name],Brian [name]

## 2012-02-25 NOTE — Progress Notes (Addendum)
  VASCULAR AND VEIN SURGERY PROGRESS NOTE  Progress note  Date of Surgery: 02/17/2012 - 02/18/2012 Surgeon: Juliann Mule): Angelia Mould, MD 7 Days Post-Op Right Procedure(s): ARTERIOVENOUS (AV) FISTULA CREATION   HPI: Brian [name] is a 36 y.o. male CKD . The cause of renal failure is thought to be secondary to polycystic kidney disease     Significant Diagnostic Studies: CBC    Component Value Date/Time   WBC 4.7 02/25/2012 0500   RBC 3.34* 02/25/2012 0500   HGB 10.6* 02/25/2012 0500   HCT 31.4* 02/25/2012 0500   PLT 138* 02/25/2012 0500   MCV 94.0 02/25/2012 0500   MCH 31.7 02/25/2012 0500   MCHC 33.8 02/25/2012 0500   RDW 12.9 02/25/2012 0500   LYMPHSABS 1.8 11/25/2011 0942   MONOABS 0.6 11/25/2011 0942   EOSABS 0.2 11/25/2011 0942   BASOSABS 0.0 11/25/2011 0942    BMET    Component Value Date/Time   NA 134* 02/25/2012 0501   K 4.7 02/25/2012 0501   CL 104 02/25/2012 0501   CO2 16* 02/25/2012 0501   GLUCOSE 85 02/25/2012 0501   BUN 56* 02/25/2012 0501   CREATININE 4.48* 02/25/2012 0501   CREATININE 3.76* 12/26/2011 0859   CALCIUM 8.2* 02/25/2012 0501   CALCIUM 8.4 10/14/2011 1432   GFRNONAA 15* 02/25/2012 0501   GFRAA 18* 02/25/2012 0501    COAG Lab Results  Component Value Date   INR 2.01* 02/25/2012   INR 1.85* 02/24/2012   INR 1.61* 02/23/2012   No results found for this basename: PTT     I/O last 3 completed shifts: In: 1163.6 [P.O.:720; I.V.:443.6] Out: -   Physical Examination  Patient Vitals for the past 24 hrs:  BP Temp Temp src Pulse Resp SpO2 Weight  02/25/12 0501 109/72 mmHg 97.6 F (36.4 C) Oral 77  18  98 % -  02/24/12 2053 152/92 mmHg 98 F (36.7 C) Oral 80  18  98 % 216 lb 7.9 oz (98.2 kg)  02/24/12 1800 133/81 mmHg 98 F (36.7 C) Oral 78  18  95 % -  02/24/12 1400 146/86 mmHg 97.8 F (36.6 C) Oral 79  18  95 % -  02/24/12 1000 131/82 mmHg 97.6 F (36.4 C) Oral 81  18  96 % -    Pt Incision is clean, dry, intact or healing well, skin  color is normal, no cyanosis, jaundice, pallor or bruising, normal .  Palpable radial and ulnar pulses equal bil.  Lacks -5 degrees of full elbow extension.  Assessment/Plan  Brian [name] is a 36 y.o. year old male who is S/P Procedure(s):  ARTERIOVENOUS (AV) FISTULA CREATION F/U in the office in 4-6 weeks from surgery. His INR was 2.01 today he will continue taking 10mg  of coumadin daily and f/u with his primary care physician for INR cks. Laurence Slate [facility] 02/25/2012 7:21 AM

## 2012-03-02 ENCOUNTER — Ambulatory Visit (INDEPENDENT_AMBULATORY_CARE_PROVIDER_SITE_OTHER): Payer: Self-pay | Admitting: Pharmacist

## 2012-03-02 DIAGNOSIS — Z86718 Personal history of other venous thrombosis and embolism: Secondary | ICD-10-CM

## 2012-03-02 DIAGNOSIS — I2699 Other pulmonary embolism without acute cor pulmonale: Secondary | ICD-10-CM

## 2012-03-02 DIAGNOSIS — Z7901 Long term (current) use of anticoagulants: Secondary | ICD-10-CM

## 2012-03-02 NOTE — Progress Notes (Signed)
Anti-Coagulation Progress Note  KEELER MCWAIN is a 36 y.o. male who is currently on an anti-coagulation regimen.    RECENT RESULTS: Recent results are below, the most recent result is correlated with a dose of 85 mg. per week: Lab Results  Component Value Date   INR 3.40 03/02/2012   INR 2.01* 02/25/2012   INR 1.85* 02/24/2012    ANTI-COAG DOSE:   Latest dosing instructions   Total Sun Mon Tue Wed Thu Fri Sat   75 10 mg 12.5 mg 10 mg 10 mg 12.5 mg 10 mg 10 mg    (5 mg2) (5 mg2.5) (5 mg2) (5 mg2) (5 mg2.5) (5 mg2) (5 mg2)         ANTICOAG SUMMARY: Anticoagulation Episode Summary              Current INR goal 2.0-3.0 Next INR check 03/23/2012   INR from last check 3.40! (03/02/2012)     Weekly max dose (mg)  Target end date Indefinite   Indications Long term current use of anticoagulant, PULMONARY EMBOLISM, History of DVT (deep vein thrombosis)   INR check location [facility]erred lab    Send INR reminders to ANTICOAG IMP   Comments Has had two episodes of VTE (1 index VTE and 1 subsequent bilateral PE). Indefinite therapy suggested.       Provider Role Specialty Phone number   Bartholomew Crews, MD  Internal Medicine [phone]        ANTICOAG TODAY: Anticoagulation Summary as of 03/02/2012              INR goal 2.0-3.0     Selected INR 3.40! (03/02/2012) Next INR check 03/23/2012   Weekly max dose (mg)  Target end date Indefinite   Indications Long term current use of anticoagulant, PULMONARY EMBOLISM, History of DVT (deep vein thrombosis)    Anticoagulation Episode Summary              INR check location [facility] Preferred lab    Send INR reminders to ANTICOAG IMP   Comments Has had two episodes of VTE (1 index VTE and 1 subsequent bilateral PE). Indefinite therapy suggested.       Provider Role Specialty Phone number   Bartholomew Crews, MD  Internal Medicine [phone]        PATIENT INSTRUCTIONS: Patient Instructions  Patient  instructed to take medications as defined in the Anti-coagulation Track section of this encounter.  Patient instructed to take today's dose.  Patient verbalized understanding of these instructions.        FOLLOW-UP Return in 3 weeks (on 03/23/2012) for Follow up INR.  Jorene Guest, III Pharm.D., CACP

## 2012-03-02 NOTE — Patient Instructions (Signed)
Patient instructed to take medications as defined in the Anti-coagulation Track section of this encounter.  Patient instructed to take today's dose.  Patient verbalized understanding of these instructions.    

## 2012-03-16 ENCOUNTER — Encounter: Payer: Self-pay | Admitting: Internal Medicine

## 2012-03-19 ENCOUNTER — Other Ambulatory Visit: Payer: Self-pay

## 2012-03-19 DIAGNOSIS — Z113 Encounter for screening for infections with a predominantly sexual mode of transmission: Secondary | ICD-10-CM

## 2012-03-19 DIAGNOSIS — B20 Human immunodeficiency virus [HIV] disease: Secondary | ICD-10-CM

## 2012-03-19 DIAGNOSIS — Z79899 Other long term (current) drug therapy: Secondary | ICD-10-CM

## 2012-03-20 LAB — T-HELPER CELL (CD4) - (RCID CLINIC ONLY): CD4 T Cell Abs: 720 uL (ref 400–2700)

## 2012-03-22 ENCOUNTER — Inpatient Hospital Stay (HOSPITAL_COMMUNITY)
Admission: EM | Admit: 2012-03-22 | Discharge: 2012-03-27 | DRG: 696 | Disposition: A | Discharge status: Home / Self Care | Payer: Medicaid Other | Attending: Internal Medicine | Admitting: Internal Medicine

## 2012-03-22 ENCOUNTER — Encounter (HOSPITAL_COMMUNITY): Payer: Self-pay

## 2012-03-22 ENCOUNTER — Emergency Department (HOSPITAL_COMMUNITY): Payer: Medicaid Other

## 2012-03-22 DIAGNOSIS — N184 Chronic kidney disease, stage 4 (severe): Secondary | ICD-10-CM | POA: Diagnosis present

## 2012-03-22 DIAGNOSIS — Z86718 Personal history of other venous thrombosis and embolism: Secondary | ICD-10-CM

## 2012-03-22 DIAGNOSIS — Z7901 Long term (current) use of anticoagulants: Secondary | ICD-10-CM

## 2012-03-22 DIAGNOSIS — R197 Diarrhea, unspecified: Secondary | ICD-10-CM | POA: Diagnosis present

## 2012-03-22 DIAGNOSIS — I10 Essential (primary) hypertension: Secondary | ICD-10-CM | POA: Diagnosis present

## 2012-03-22 DIAGNOSIS — N289 Disorder of kidney and ureter, unspecified: Secondary | ICD-10-CM

## 2012-03-22 DIAGNOSIS — N179 Acute kidney failure, unspecified: Secondary | ICD-10-CM | POA: Diagnosis present

## 2012-03-22 DIAGNOSIS — F1010 Alcohol abuse, uncomplicated: Secondary | ICD-10-CM | POA: Diagnosis present

## 2012-03-22 DIAGNOSIS — R319 Hematuria, unspecified: Secondary | ICD-10-CM

## 2012-03-22 DIAGNOSIS — Z21 Asymptomatic human immunodeficiency virus [HIV] infection status: Secondary | ICD-10-CM | POA: Diagnosis present

## 2012-03-22 DIAGNOSIS — R310 Gross hematuria: Principal | ICD-10-CM | POA: Diagnosis present

## 2012-03-22 DIAGNOSIS — B20 Human immunodeficiency virus [HIV] disease: Secondary | ICD-10-CM | POA: Diagnosis present

## 2012-03-22 DIAGNOSIS — R791 Abnormal coagulation profile: Secondary | ICD-10-CM | POA: Diagnosis present

## 2012-03-22 DIAGNOSIS — Q613 Polycystic kidney, unspecified: Secondary | ICD-10-CM

## 2012-03-22 DIAGNOSIS — R21 Rash and other nonspecific skin eruption: Secondary | ICD-10-CM | POA: Diagnosis not present

## 2012-03-22 DIAGNOSIS — I129 Hypertensive chronic kidney disease with stage 1 through stage 4 chronic kidney disease, or unspecified chronic kidney disease: Secondary | ICD-10-CM | POA: Diagnosis present

## 2012-03-22 DIAGNOSIS — D649 Anemia, unspecified: Secondary | ICD-10-CM | POA: Diagnosis present

## 2012-03-22 DIAGNOSIS — F17200 Nicotine dependence, unspecified, uncomplicated: Secondary | ICD-10-CM | POA: Diagnosis present

## 2012-03-22 HISTORY — DX: Personal history of other diseases of the digestive system: Z87.19

## 2012-03-22 LAB — BASIC METABOLIC PANEL
BUN: 67 mg/dL — ABNORMAL HIGH (ref 6–23)
Creatinine, Ser: 5.88 mg/dL — ABNORMAL HIGH (ref 0.50–1.35)
GFR calc Af Amer: 13 mL/min — ABNORMAL LOW (ref >90)
GFR calc non Af Amer: 11 mL/min — ABNORMAL LOW (ref >90)
Glucose, Bld: 112 mg/dL — ABNORMAL HIGH (ref 70–99)

## 2012-03-22 LAB — URINALYSIS, ROUTINE W REFLEX MICROSCOPIC
Nitrite: NEGATIVE
Specific Gravity, Urine: 1.015 (ref 1.005–1.030)
Urobilinogen, UA: 0.2 mg/dL (ref 0.0–1.0)

## 2012-03-22 LAB — CBC
HCT: 29.3 % — ABNORMAL LOW (ref 39.0–52.0)
MCH: 31.5 pg (ref 26.0–34.0)
MCHC: 36.2 g/dL — ABNORMAL HIGH (ref 30.0–36.0)
MCV: 87.2 fL (ref 78.0–100.0)
Platelets: 195 10*3/uL (ref 150–400)
RDW: 12.1 % (ref 11.5–15.5)
WBC: 10.5 10*3/uL (ref 4.0–10.5)

## 2012-03-22 LAB — DIFFERENTIAL
Basophils Absolute: 0 10*3/uL (ref 0.0–0.1)
Basophils Relative: 0 % (ref 0–1)
Eosinophils Absolute: 0 10*3/uL (ref 0.0–0.7)
Eosinophils Relative: 0 % (ref 0–5)
Monocytes Absolute: 1.2 10*3/uL — ABNORMAL HIGH (ref 0.1–1.0)

## 2012-03-22 LAB — URINE MICROSCOPIC-ADD ON

## 2012-03-22 LAB — CREATININE, URINE, RANDOM: Creatinine, Urine: 63.7 mg/dL

## 2012-03-22 LAB — PHOSPHORUS: Phosphorus: 6.5 mg/dL — ABNORMAL HIGH (ref 2.3–4.6)

## 2012-03-22 LAB — HEPATIC FUNCTION PANEL
Alkaline Phosphatase: 73 U/L (ref 39–117)
Bilirubin, Direct: <0.1 mg/dL (ref 0.0–0.3)
Total Bilirubin: 0.2 mg/dL — ABNORMAL LOW (ref 0.3–1.2)

## 2012-03-22 LAB — ABO/RH: ABO/RH(D): A POS

## 2012-03-22 LAB — OSMOLALITY, URINE: Osmolality, Ur: 262 mOsm/kg — ABNORMAL LOW (ref 390–1090)

## 2012-03-22 LAB — PROTIME-INR: Prothrombin Time: 66.3 seconds — ABNORMAL HIGH (ref 11.6–15.2)

## 2012-03-22 MED ORDER — ALUM & MAG HYDROXIDE-SIMETH 200-200-20 MG/5ML PO SUSP
30.0000 mL | Freq: Four times a day (QID) | ORAL | Status: DC | PRN
Start: 2012-03-22 — End: 2012-03-27
  Filled 2012-03-22: qty 30

## 2012-03-22 MED ORDER — ACETAMINOPHEN 325 MG PO TABS: 650.0000 mg | ORAL_TABLET | Freq: Four times a day (QID) | ORAL | Status: DC | PRN

## 2012-03-22 MED ORDER — SODIUM CHLORIDE 0.9 % IV SOLN
Freq: Once | INTRAVENOUS | Status: AC
  Administered 2012-03-22: 14:00:00 via INTRAVENOUS

## 2012-03-22 MED ORDER — CHLORHEXIDINE GLUCONATE 0.12 % MT SOLN
15.0000 mL | Freq: Two times a day (BID) | OROMUCOSAL | Status: DC
  Administered 2012-03-22 – 2012-03-25 (×7): 15 mL via OROMUCOSAL
  Filled 2012-03-22 (×8): qty 15

## 2012-03-22 MED ORDER — SODIUM CHLORIDE 0.9 % IJ SOLN
3.0000 mL | Freq: Two times a day (BID) | INTRAMUSCULAR | Status: DC
  Administered 2012-03-22 – 2012-03-27 (×7): 3 mL via INTRAVENOUS

## 2012-03-22 MED ORDER — EMTRICITABINE-TENOFOVIR DF 200-300 MG PO TABS
1.0000 | ORAL_TABLET | ORAL | Status: DC
  Administered 2012-03-23 – 2012-03-25 (×2): 1 via ORAL
  Filled 2012-03-22 (×3): qty 1

## 2012-03-22 MED ORDER — SODIUM BICARBONATE 650 MG PO TABS
1300.0000 mg | ORAL_TABLET | Freq: Two times a day (BID) | ORAL | Status: DC
  Administered 2012-03-22 – 2012-03-27 (×10): 1300 mg via ORAL
  Filled 2012-03-22 (×11): qty 2

## 2012-03-22 MED ORDER — VITAMIN K1 10 MG/ML IJ SOLN
5.0000 mg | Freq: Once | INTRAMUSCULAR | Status: DC
  Filled 2012-03-22 (×2): qty 0.5

## 2012-03-22 MED ORDER — SODIUM CHLORIDE 0.9 % IV SOLN
INTRAVENOUS | Status: DC
  Administered 2012-03-22 – 2012-03-23 (×2): via INTRAVENOUS

## 2012-03-22 MED ORDER — AMLODIPINE BESYLATE 10 MG PO TABS
10.0000 mg | ORAL_TABLET | Freq: Every day | ORAL | Status: DC
  Administered 2012-03-23 – 2012-03-27 (×5): 10 mg via ORAL
  Filled 2012-03-22 (×5): qty 1

## 2012-03-22 MED ORDER — PARICALCITOL 1 MCG PO CAPS
1.0000 ug | ORAL_CAPSULE | ORAL | Status: DC
  Administered 2012-03-23 – 2012-03-27 (×3): 1 ug via ORAL
  Filled 2012-03-22 (×3): qty 1

## 2012-03-22 MED ORDER — BIOTENE DRY MOUTH MT LIQD
15.0000 mL | Freq: Two times a day (BID) | OROMUCOSAL | Status: DC
  Administered 2012-03-22 – 2012-03-27 (×8): 15 mL via OROMUCOSAL

## 2012-03-22 MED ORDER — EFAVIRENZ 600 MG PO TABS
600.0000 mg | ORAL_TABLET | Freq: Every day | ORAL | Status: DC
  Administered 2012-03-22 – 2012-03-26 (×5): 600 mg via ORAL
  Filled 2012-03-22 (×6): qty 1

## 2012-03-22 MED ORDER — HYDROCODONE-ACETAMINOPHEN 5-325 MG PO TABS
1.0000 | ORAL_TABLET | ORAL | Status: DC | PRN
  Administered 2012-03-23: 2 via ORAL
  Filled 2012-03-22 (×2): qty 2

## 2012-03-22 MED ORDER — MORPHINE SULFATE 2 MG/ML IJ SOLN
2.0000 mg | INTRAMUSCULAR | Status: DC | PRN
  Administered 2012-03-22 – 2012-03-23 (×3): 4 mg via INTRAVENOUS
  Filled 2012-03-22: qty 2
  Filled 2012-03-22: qty 1
  Filled 2012-03-22: qty 2
  Filled 2012-03-22: qty 1

## 2012-03-22 MED ORDER — WARFARIN - PHARMACIST DOSING INPATIENT: Freq: Every day | Status: DC

## 2012-03-22 MED ORDER — ONDANSETRON HCL 4 MG/2ML IJ SOLN: 4.0000 mg | Freq: Four times a day (QID) | INTRAMUSCULAR | Status: DC | PRN

## 2012-03-22 MED ORDER — HYDROMORPHONE HCL PF 1 MG/ML IJ SOLN
1.0000 mg | Freq: Once | INTRAMUSCULAR | Status: AC
  Administered 2012-03-22: 1 mg via INTRAVENOUS
  Filled 2012-03-22: qty 1

## 2012-03-22 MED ORDER — ONDANSETRON HCL 4 MG PO TABS: 4.0000 mg | ORAL_TABLET | Freq: Four times a day (QID) | ORAL | Status: DC | PRN

## 2012-03-22 MED ORDER — ACETAMINOPHEN 650 MG RE SUPP: 650.0000 mg | Freq: Four times a day (QID) | RECTAL | Status: DC | PRN
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
## 2012-03-22 NOTE — ED Notes (Addendum)
Critical INR reported from lab. 7.4. MD notified

## 2012-03-22 NOTE — ED Notes (Signed)
Patient presents with hematuria, left flank pain since Friday this past week with intermittent diarrhea. Patient unable to eat  or sleep over past few nights.
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
## 2012-03-23 ENCOUNTER — Ambulatory Visit: Payer: Self-pay

## 2012-03-23 LAB — CBC
HCT: 25.8 % — ABNORMAL LOW (ref 39.0–52.0)
HCT: 25.8 % — ABNORMAL LOW (ref 39.0–52.0)
MCH: 31.6 pg (ref 26.0–34.0)
MCHC: 35.3 g/dL (ref 30.0–36.0)
MCHC: 35.7 g/dL (ref 30.0–36.0)
MCV: 89 fL (ref 78.0–100.0)
MCV: 88.7 fL (ref 78.0–100.0)
Platelets: 181 10*3/uL (ref 150–400)
RDW: 12.4 % (ref 11.5–15.5)
RDW: 12.3 % (ref 11.5–15.5)

## 2012-03-23 LAB — BASIC METABOLIC PANEL
BUN: 58 mg/dL — ABNORMAL HIGH (ref 6–23)
CO2: 21 mEq/L (ref 19–32)
Chloride: 92 mEq/L — ABNORMAL LOW (ref 96–112)
Creatinine, Ser: 5.21 mg/dL — ABNORMAL HIGH (ref 0.50–1.35)
GFR calc Af Amer: 15 mL/min — ABNORMAL LOW (ref >90)

## 2012-03-23 LAB — PROTIME-INR: INR: 2.69 — ABNORMAL HIGH (ref 0.00–1.49)

## 2012-03-23 MED ORDER — WARFARIN SODIUM 2 MG PO TABS
2.0000 mg | ORAL_TABLET | Freq: Once | ORAL | Status: AC
  Administered 2012-03-23: 2 mg via ORAL
  Filled 2012-03-23: qty 1

## 2012-03-23 MED ORDER — NICOTINE 21 MG/24HR TD PT24
21.0000 mg | MEDICATED_PATCH | Freq: Every day | TRANSDERMAL | Status: DC
  Administered 2012-03-23 – 2012-03-27 (×6): 21 mg via TRANSDERMAL
  Filled 2012-03-23 (×6): qty 1

## 2012-03-23 MED ORDER — POTASSIUM CHLORIDE CRYS ER 20 MEQ PO TBCR: 40.0000 meq | EXTENDED_RELEASE_TABLET | Freq: Three times a day (TID) | ORAL | Status: DC

## 2012-03-23 MED ORDER — HYDROMORPHONE HCL PF 1 MG/ML IJ SOLN
1.0000 mg | INTRAMUSCULAR | Status: DC | PRN
  Administered 2012-03-23 – 2012-03-27 (×20): 1 mg via INTRAVENOUS
  Filled 2012-03-23 (×21): qty 1

## 2012-03-23 MED ORDER — WARFARIN VIDEO: Freq: Once | Status: DC

## 2012-03-23 MED ORDER — POTASSIUM CHLORIDE CRYS ER 20 MEQ PO TBCR
40.0000 meq | EXTENDED_RELEASE_TABLET | Freq: Three times a day (TID) | ORAL | Status: DC
  Administered 2012-03-23: 40 meq via ORAL
  Filled 2012-03-23: qty 2

## 2012-03-23 MED ORDER — COUMADIN BOOK
Freq: Once | Status: AC
  Administered 2012-03-23: 11:00:00
  Filled 2012-03-23 (×2): qty 1

## 2012-03-23 NOTE — Progress Notes (Signed)
Pt was given the coumadin booklet but refused to look at the video stating he has been on coumadin for a year and does not need any further information on it. Will cont to monitor.

## 2012-03-23 NOTE — Progress Notes (Signed)
Subjective: 3 units of FFP overnight.  Still has gross hematuria.  Abdominal pain still present unless completely still.  No diarrhea overnight.  Objective: Vital signs in last 24 hours: Filed Vitals:   03/23/12 0710 03/23/12 0810 03/23/12 1000 03/23/12 1250  BP: 149/82 134/82 128/102 136/92  Pulse: 102 105 102 120  Temp: 98.5 F (36.9 C) 98.5 F (36.9 C) 98 F (36.7 C) 98.8 F (37.1 C)  TempSrc: Oral Oral Oral Oral  Resp: 18 18 20 20   Height:      Weight:      SpO2:   98%    Weight change:   Intake/Output Summary (Last 24 hours) at 03/23/12 1334 Last data filed at 03/23/12 1330  Gross per 24 hour  Intake 1726.83 ml  Output   1900 ml  Net -173.17 ml   Physical Exam: GEN: NAD.  Alert and oriented x 3.  Pleasant, conversant, and cooperative to exam. RESP:  CTAB, no w/r/r CARDIOVASCULAR: RRR, S1, S2, no m/r/g ABDOMEN: persistent TTP in LLQ, also in LUQ (not as bad), no distention, + rebound EXT: warm and dry. No edema in b/l LE  Lab Results: Basic Metabolic Panel:  Lab 0000000 0917 03/22/12 1707 03/22/12 1326  NA 127* -- 128*  K 3.2* -- 3.7  CL 92* -- 89*  CO2 21 -- 20  GLUCOSE 123* -- 112*  BUN 58* -- 67*  CREATININE 5.21* -- 5.88*  CALCIUM 7.8* -- 8.1*  MG -- -- --  PHOS -- 6.5* --   Liver Function Tests:  Lab 03/22/12 1707  AST 16  ALT 13  ALKPHOS 73  BILITOT 0.2*  PROT 7.4  ALBUMIN 3.0*   CBC:  Lab 03/23/12 0917 03/22/12 1326  WBC 5.6 10.5  NEUTROABS -- 7.7  HGB 9.1* 10.6*  HCT 25.8* 29.3*  MCV 89.0 87.2  PLT 163 195   Coagulation:  Lab 03/23/12 0917 03/22/12 1326  LABPROT 30.7* 66.3*  INR 2.89* 7.74*   Urine Drug Screen: Drugs of Abuse     Component Value Date/Time   LABOPIA NEGATIVE 12/20/2011 1655   LABOPIA POSITIVE* 09/22/2010 [place] 12/20/2011 1655   COCAINSCRNUR NONE DETECTED 09/22/2010 1140   LABBENZ NEGATIVE 12/20/2011 1655   LABBENZ NEG 07/30/2011 1656   LABBENZ POSITIVE* 09/22/2010 1140   AMPHETMU NEG  07/30/2011 1656   AMPHETMU NONE DETECTED 09/22/2010 1140   THCU NEGATIVE 12/20/2011 1655   THCU NONE DETECTED 09/22/2010 1140   LABBARB NEGATIVE 12/20/2011 1655   LABBARB  Value: NONE DETECTED        DRUG SCREEN FOR MEDICAL PURPOSES ONLY.  IF CONFIRMATION IS NEEDED FOR ANY PURPOSE, NOTIFY LAB WITHIN 5 DAYS.        LOWEST DETECTABLE LIMITS FOR URINE DRUG SCREEN Drug Class       Cutoff (ng/mL) Amphetamine      1000 Barbiturate      200 Benzodiazepine   [id number] Tricyclics       [id number] Opiates          300 Cocaine          300 THC              50 09/22/2010 1140    Urinalysis:  Lab 03/22/12 1041  COLORURINE RED*  LABSPEC 1.015  PHURINE 5.5  GLUCOSEU NEGATIVE  HGBUR LARGE*  BILIRUBINUR MODERATE*  KETONESUR 15*  PROTEINUR >300*  UROBILINOGEN 0.2  NITRITE NEGATIVE  LEUKOCYTESUR SMALL*    Micro Results: Recent Results (from the  past 240 hour(s))  MRSA PCR SCREENING     Status: Normal   Collection Time   03/22/12  5:12 PM      Component Value Range Status Comment   MRSA by PCR NEGATIVE  NEGATIVE  Final    Studies/Results: Ct Abdomen Pelvis Wo Contrast  03/22/2012  *RADIOLOGY REPORT*  Clinical Data: Left flank pain, hematuria  CT ABDOMEN AND PELVIS WITHOUT CONTRAST  Technique:  Multidetector CT imaging of the abdomen and pelvis was performed following the standard protocol without intravenous contrast.  Comparison: 09/21/2010  Findings: Lung bases shows patchy atelectasis or infiltrate left lower lobe posteriorly.  Innumerable hepatic cysts are again noted without significant change from prior exam. Small left pleural effusion.  Again noted markedly enlarged bilateral kidneys with polycystic kidney disease.  The largest cyst in the lower pole of the right kidney anterior aspect measures 9.5 cm.  The largest cyst in the mid pole of the left kidney measures 6.6 cm.  Scattered bilateral calcifications are again noted.  The largest of calcified calculus mid pole of the left kidney measures 6 mm.  Largest calcified  calculus in upper pole of the right kidney measures 0.5 mm.  There is mild left perinephric stranding.  No hydronephrosis or hydroureter.  Small amount of stranding / fluid is noted left flank region just behind the descending colon best seen in axial image 57.  Bilateral visualized ureter is unremarkable without evidence of calcified calculi.  No calcified calculi are noted within the urinary bladder.  Again noted bilateral some of the cysts with high density material content probable proteinaceous material without significant change from prior exam.  On the coronal images right kidney measures 24.1 x 13.8 cm.  The left kidney measures 24.2 x 14.8 cm.  Linear calcifications in the peripheral wall of the cyst in the lower pole of the right kidney is stable.  Sagittal images of the spine shows no destructive bony lesions. Stable mild compression deformity upper endplate of L2 vertebral body.  No small bowel obstruction.  No aortic aneurysm.  Small amount of stranding noted anterior to the left psoas muscle in the left pelvis.  IMPRESSION:  1. Left lung base posterior atelectasis or infiltrate.  Small left pleural effusion. 2.  Again noted innumerable hepatic cysts grossly stable from prior exam.  3.  Again noted bilateral mild enlarged polycystic kidneys. Scattered parenchymal calcification bilateral renal are stable from prior exam.  The largest calcification in the in the mid pole of the left kidney measures 6 mm.  Largest calcification mid pole of the right kidney measures 6.5 mm.  There is mild left posterolateral perinephric stranding.  A small amount of fluid is noted in the left flank paracolic gutter.  There is small amount of stranding anterior to left psoas muscle. 4.  No hydronephrosis or hydroureter.  No definite calcified ureteral calculi are noted. 5.  No calcified calculi are noted within urinary bladder. 6.  Stable mild compression deformity upper endplate of the L2 vertebral body.  Original Report  Authenticated By: Lahoma Crocker, M.D.   Medications: I have reviewed the patient's current medications. Scheduled Meds:   . sodium chloride   Intravenous Once  . amLODipine  10 mg Oral Daily  . antiseptic oral rinse  15 mL Mouth Rinse q12n4p  . chlorhexidine  15 mL Mouth Rinse BID  . coumadin book   Does not apply Once  . efavirenz  600 mg Oral QHS  . emtricitabine-tenofovir  1 tablet Oral  3 times weekly  .  HYDROmorphone (DILAUDID) injection  1 mg Intravenous Once  . nicotine  21 mg Transdermal Daily  . paricalcitol  1 mcg Oral Q M,W,F  . sodium bicarbonate  1,300 mg Oral BID  . sodium chloride  3 mL Intravenous Q12H  . warfarin  2 mg Oral ONCE-1800  . warfarin   Does not apply Once  . Warfarin - Pharmacist Dosing Inpatient   Does not apply q1800  . DISCONTD: phytonadione (VITAMIN K) IV  5 mg Intravenous Once   Continuous Infusions:   . sodium chloride 50 mL/hr at 03/22/12 1856   PRN Meds:.acetaminophen, acetaminophen, alum & mag hydroxide-simeth, HYDROcodone-acetaminophen, HYDROmorphone (DILAUDID) injection, ondansetron (ZOFRAN) IV, ondansetron, DISCONTD:  morphine injection  Assessment/Plan: # Gross Hematuria  Pt with h/o PCKD p/w 3 days progressive hematuria amidst supratherapeutic INR of 7.7. Down to 2.8 after 3 units FFP, but still having gross hematuria today.  CT shows perinephric stranding on L and small fluid around L psoas. Pt has abdominal tenderness on L. Vitals stable, hemoglobin dropped to 9.1.  Given con't bleeding, will give additional FFP and consult urology.  Could all be 2/2 ruptured cyst, unsure if clot in ureter would cause colicky pain, but seems less likely given elevated INR. - 1 more unit FFP (will be total of 4) - urology consultation - consider MRI to further evaluate renal lesion in L kidney (? 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  Was 7.7 on admission, down to 2.8 after 3 units FFP.  Diarrheal illness  possibly led to increased INR, as pt reports good compliance. - 1 more unit FFP  - no vitamin K at this time  - recheck INR after FFP  # Abdominal Pain  Con't LLQ pain with some rebound.  Unclear if a ruptured cyst could account for all CT findings.  No leukocytosis, no UTI. CT abdomen shows perinephic stranding and fluid around psoas on L. Ddx includes gastroenteritis, ruptured renal cyst, no stones seen on CT. - urology consult  - con't to monitor  - c diff pending, but diarrhea has resolved - morphine, vicodin  # AKI on PCKD  Recently s/p R AVF placement for eventual initiation of dialysis. Has had kidney problems since 2006. Cr still above 5 but trending down.  Baseline around 4-4.5.  - bmet in a.m.  - con't amlodipine  - hold lasix  # Elevated Anion Gap  Improved to 14, likely 2/2 CKD.  Lactate wnl. - con't gentle fluids - trend bmet   # HIV  Well controlled.  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 Was stable in 2012, is overdue for f/u exam in 2013.  - consider CT chest, but need to consider contrast given AKI/CKD  # Ppx  - scd's   LOS: 1 day   [facility], BEN 03/23/2012, 1:34 PM

## 2012-03-23 NOTE — Progress Notes (Signed)
CM received order for medication assistance, however, per notes of last visit , pt will qualified for medicaid and had a pending medicaid number. CM will check with pt to make sure he is aware of Medicaid or has a card so that he can purchase medication.  Jasmine Pang RN MPH Case Manager [phone]
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
## 2012-03-23 NOTE — H&P (Signed)
IM Attending  14 man with severe polycystic disease of kidneys and liver.  Admitted for 3 days hematuria and severe LLQ pain.  Has had diarrhea.  No UTI sx.  Recent fistula placement for HD.  Baseline creat = 4.5.  Also has HIV with CD4 = 720 on ART. Has LLQ tenderness w/o guarding or rebound.  Nl temp and BP.  Hgb = 10.6 and WBC = 10.5.  Creat up to 5.9 Imaging shows astounding collection of cysts and probable free fluid in left abd and pelvis. On warfarin and reports compliance.  Admitted with INR of 7.7 -- ? due to poor PO and diarrhea. Imp:  Presumably has a ruptured cyst and free bleeding.  We are correcting INR with FFP and closely following hgb and V.S.  If pain resolves and hgb remains stable, this may not require further w/u or Rx.  Otherwise may need to consult urology.
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
## 2012-03-24 LAB — BASIC METABOLIC PANEL
CO2: 19 mEq/L (ref 19–32)
Calcium: 7.5 mg/dL — ABNORMAL LOW (ref 8.4–10.5)
Chloride: 99 mEq/L (ref 96–112)
GFR calc Af Amer: 17 mL/min — ABNORMAL LOW (ref >90)
GFR calc non Af Amer: 15 mL/min — ABNORMAL LOW (ref >90)
Glucose, Bld: 97 mg/dL (ref 70–99)

## 2012-03-24 LAB — PREPARE FRESH FROZEN PLASMA
Unit division: 0
Unit division: 0

## 2012-03-24 LAB — CBC
HCT: 26.1 % — ABNORMAL LOW (ref 39.0–52.0)
Hemoglobin: 8.9 g/dL — ABNORMAL LOW (ref 13.0–17.0)
MCH: 30.8 pg (ref 26.0–34.0)
MCV: 90.3 fL (ref 78.0–100.0)
Platelets: 196 10*3/uL (ref 150–400)
RBC: 2.89 MIL/uL — ABNORMAL LOW (ref 4.22–5.81)
WBC: 5.5 10*3/uL (ref 4.0–10.5)

## 2012-03-24 LAB — PROTIME-INR: INR: 2.28 — ABNORMAL HIGH (ref 0.00–1.49)

## 2012-03-24 LAB — URINE CULTURE
Colony Count: NO GROWTH
Culture  Setup Time: 201304081854

## 2012-03-24 MED ORDER — WHITE PETROLATUM GEL
Status: AC
  Administered 2012-03-24: 11:00:00
  Filled 2012-03-24: qty 5

## 2012-03-24 MED ORDER — WARFARIN SODIUM 1 MG PO TABS
1.0000 mg | ORAL_TABLET | Freq: Once | ORAL | Status: AC
  Administered 2012-03-24: 1 mg via ORAL
  Filled 2012-03-24: qty 1
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
## 2012-03-24 NOTE — Progress Notes (Signed)
Subjective: 3 units of FFP overnight.  Still has gross hematuria.  Abdominal pain still present unless completely still.  No diarrhea overnight.  Objective: Vital signs in last 24 hours: Filed Vitals:   03/23/12 1605 03/23/12 1800 03/23/12 2118 03/24/12 0645  BP: 137/82 136/79 154/84 144/89  Pulse: 103 103 108 111  Temp: 98.7 F (37.1 C) 98.5 F (36.9 C) 99.3 F (37.4 C) 98.7 F (37.1 C)  TempSrc: Oral Oral Oral Oral  Resp: 20 20 20 19   Height:      Weight:      SpO2:  96% 93% 96%   Weight change:   Intake/Output Summary (Last 24 hours) at 03/24/12 0937 Last data filed at 03/24/12 0700  Gross per 24 hour  Intake    960 ml  Output   2400 ml  Net  -1440 ml   Physical Exam: GEN: NAD.  Alert and oriented x 3.  Pleasant, conversant, and cooperative to exam. RESP:  CTAB, no w/r/r CARDIOVASCULAR: tachycardic rate, reg rhythm, S1, S2, no m/r/g ABDOMEN: persistent TTP in LLQ, also in LUQ (not as bad), no distention, + rebound EXT: warm and dry. No edema in b/l LE  Lab Results: Basic Metabolic Panel:  Lab [id number] 0610 03/23/12 0917 03/22/12 1707  NA 133* 127* --  K 3.6 3.2* --  CL 99 92* --  CO2 19 21 --  GLUCOSE 97 123* --  BUN 54* 58* --  CREATININE 4.70* 5.21* --  CALCIUM 7.5* 7.8* --  MG -- -- --  PHOS -- -- 6.5*   Liver Function Tests:  Lab 03/22/12 1707  AST 16  ALT 13  ALKPHOS 73  BILITOT 0.2*  PROT 7.4  ALBUMIN 3.0*   CBC:  Lab 03/24/12 0610 03/23/12 1706 03/22/12 1326  WBC 5.5 5.8 --  NEUTROABS -- -- 7.7  HGB 8.9* 9.2* --  HCT 26.1* 25.8* --  MCV 90.3 88.7 --  PLT 196 181 --   Coagulation:  Lab 03/24/12 0610 03/23/12 1706 03/23/12 0917 03/22/12 1326  LABPROT 29.1* 29.0* 30.7* 66.3*  INR 2.70* 2.69* 2.89* 7.74*   Urine Drug Screen: Drugs of Abuse     Component Value Date/Time   LABOPIA NEGATIVE 12/20/2011 1655   LABOPIA POSITIVE* 09/22/2010 [place] 12/20/2011 1655   COCAINSCRNUR NONE DETECTED 09/22/2010 1140   LABBENZ  NEGATIVE 12/20/2011 1655   LABBENZ NEG 07/30/2011 1656   LABBENZ POSITIVE* 09/22/2010 1140   AMPHETMU NEG 07/30/2011 1656   AMPHETMU NONE DETECTED 09/22/2010 1140   THCU NEGATIVE 12/20/2011 1655   THCU NONE DETECTED 09/22/2010 1140   LABBARB NEGATIVE 12/20/2011 1655   LABBARB  Value: NONE DETECTED        DRUG SCREEN FOR MEDICAL PURPOSES ONLY.  IF CONFIRMATION IS NEEDED FOR ANY PURPOSE, NOTIFY LAB WITHIN 5 DAYS.        LOWEST DETECTABLE LIMITS FOR URINE DRUG SCREEN Drug Class       Cutoff (ng/mL) Amphetamine      1000 Barbiturate      200 Benzodiazepine   [id number] Tricyclics       [id number] Opiates          300 Cocaine          300 THC              50 09/22/2010 1140    Urinalysis:  Lab 03/22/12 1041  COLORURINE RED*  LABSPEC 1.015  PHURINE 5.5  GLUCOSEU NEGATIVE  HGBUR LARGE*  BILIRUBINUR MODERATE*  KETONESUR 15*  PROTEINUR >300*  UROBILINOGEN 0.2  NITRITE NEGATIVE  LEUKOCYTESUR SMALL*    Micro Results: Recent Results (from the past 240 hour(s))  MRSA PCR SCREENING     Status: Normal   Collection Time   03/22/12  5:12 PM      Component Value Range Status Comment   MRSA by PCR NEGATIVE  NEGATIVE  Final   CLOSTRIDIUM DIFFICILE BY PCR     Status: Normal   Collection Time   03/23/12 12:13 PM      Component Value Range Status Comment   C difficile by pcr NEGATIVE  NEGATIVE  Final    Studies/Results: Ct Abdomen Pelvis Wo Contrast  03/22/2012  *RADIOLOGY REPORT*  Clinical Data: Left flank pain, hematuria  CT ABDOMEN AND PELVIS WITHOUT CONTRAST  Technique:  Multidetector CT imaging of the abdomen and pelvis was performed following the standard protocol without intravenous contrast.  Comparison: 09/21/2010  Findings: Lung bases shows patchy atelectasis or infiltrate left lower lobe posteriorly.  Innumerable hepatic cysts are again noted without significant change from prior exam. Small left pleural effusion.  Again noted markedly enlarged bilateral kidneys with polycystic kidney disease.  The largest cyst in the  lower pole of the right kidney anterior aspect measures 9.5 cm.  The largest cyst in the mid pole of the left kidney measures 6.6 cm.  Scattered bilateral calcifications are again noted.  The largest of calcified calculus mid pole of the left kidney measures 6 mm.  Largest calcified calculus in upper pole of the right kidney measures 0.5 mm.  There is mild left perinephric stranding.  No hydronephrosis or hydroureter.  Small amount of stranding / fluid is noted left flank region just behind the descending colon best seen in axial image 57.  Bilateral visualized ureter is unremarkable without evidence of calcified calculi.  No calcified calculi are noted within the urinary bladder.  Again noted bilateral some of the cysts with high density material content probable proteinaceous material without significant change from prior exam.  On the coronal images right kidney measures 24.1 x 13.8 cm.  The left kidney measures 24.2 x 14.8 cm.  Linear calcifications in the peripheral wall of the cyst in the lower pole of the right kidney is stable.  Sagittal images of the spine shows no destructive bony lesions. Stable mild compression deformity upper endplate of L2 vertebral body.  No small bowel obstruction.  No aortic aneurysm.  Small amount of stranding noted anterior to the left psoas muscle in the left pelvis.  IMPRESSION:  1. Left lung base posterior atelectasis or infiltrate.  Small left pleural effusion. 2.  Again noted innumerable hepatic cysts grossly stable from prior exam.  3.  Again noted bilateral mild enlarged polycystic kidneys. Scattered parenchymal calcification bilateral renal are stable from prior exam.  The largest calcification in the in the mid pole of the left kidney measures 6 mm.  Largest calcification mid pole of the right kidney measures 6.5 mm.  There is mild left posterolateral perinephric stranding.  A small amount of fluid is noted in the left flank paracolic gutter.  There is small amount of  stranding anterior to left psoas muscle. 4.  No hydronephrosis or hydroureter.  No definite calcified ureteral calculi are noted. 5.  No calcified calculi are noted within urinary bladder. 6.  Stable mild compression deformity upper endplate of the L2 vertebral body.  Original Report Authenticated By: Lahoma Crocker, M.D.   Medications: I have reviewed the patient's current  medications. Scheduled Meds:    . amLODipine  10 mg Oral Daily  . antiseptic oral rinse  15 mL Mouth Rinse q12n4p  . chlorhexidine  15 mL Mouth Rinse BID  . coumadin book   Does not apply Once  . efavirenz  600 mg Oral QHS  . emtricitabine-tenofovir  1 tablet Oral 3 times weekly  . nicotine  21 mg Transdermal Daily  . paricalcitol  1 mcg Oral Q M,W,F  . sodium bicarbonate  1,300 mg Oral BID  . sodium chloride  3 mL Intravenous Q12H  . warfarin  2 mg Oral ONCE-1800  . warfarin   Does not apply Once  . Warfarin - Pharmacist Dosing Inpatient   Does not apply q1800  . DISCONTD: potassium chloride  40 mEq Oral TID  . DISCONTD: potassium chloride  40 mEq Oral TID   Continuous Infusions:    . sodium chloride 50 mL/hr at 03/23/12 2220   PRN Meds:.acetaminophen, acetaminophen, alum & mag hydroxide-simeth, HYDROcodone-acetaminophen, HYDROmorphone (DILAUDID) injection, ondansetron (ZOFRAN) IV, ondansetron, DISCONTD:  morphine injection  Assessment/Plan: # Gross Hematuria  Pt with h/o PCKD p/w 3 days progressive hematuria amidst supratherapeutic INR of 7.7. Down to 2.7 after 3 units FFP, but still having gross hematuria.  CT shows perinephric stranding on L and small fluid around L psoas. Pt has abdominal tenderness on L. Vitals stable, hemoglobin stable around 9.  Had given 1 additional FFP yesterday for total of 4.  Urology saw pt yesterday and agreed with medical management. Could all be 2/2 ruptured cyst, unsure if clot in ureter would cause colicky pain, but seems less likely given elevated INR and lack of hydroureter. - 1  more unit FFP (will be total of 5) - appreciate urology recommendation - consider MRI to further evaluate renal lesion in L kidney (? 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  Was 7.7 on admission, down to 2.7.  Goal is closer to two.  Want to keep therapeutic given recent AVF placement and h/o clots (DVT and PE).  Still unclear why INR spiked in the first place. - 1 more unit FFP  - no vitamin K at this time  - recheck INR after FFP  # Abdominal Pain  Con't LLQ pain with some rebound.  Unclear if a ruptured cyst could account for all CT findings.  No leukocytosis, no UTI. CT abdomen shows perinephic stranding and fluid around psoas on L. Ddx includes gastroenteritis, ruptured renal cyst, no stones seen on CT. - con't to monitor  - c diff negative - morphine, vicodin - consider general surgery consult? - consider repeat CT scan?  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 Has had kidney problems since 2006. Cr trending down.  Baseline around 4-4.5.  - bmet in a.m.  - con't amlodipine  - hold lasix  # Elevated Anion Gap  15 today, up from 14, likely 2/2 CKD.  Lactate wnl. - con't PO intake - trend bmet   # HIV  Well controlled.  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 Was stable in 2012, is overdue for f/u exam in 2013.  - consider CT chest, but need to consider contrast given AKI/CKD  # Ppx  - scd's   LOS: 2 days   [facility], BEN 03/24/2012, 9:37 AM

## 2012-03-24 NOTE — Progress Notes (Signed)
Pt refused FFP at this time, pt wants to wait until after lunch so that he can get his IV pain medicine prior to the start of the FFP transfusion. Will try again at that time.

## 2012-03-25 DIAGNOSIS — R310 Gross hematuria: Principal | ICD-10-CM

## 2012-03-25 DIAGNOSIS — R109 Unspecified abdominal pain: Secondary | ICD-10-CM

## 2012-03-25 LAB — BASIC METABOLIC PANEL
CO2: 22 mEq/L (ref 19–32)
Chloride: 99 mEq/L (ref 96–112)
Creatinine, Ser: 4.49 mg/dL — ABNORMAL HIGH (ref 0.50–1.35)
GFR calc Af Amer: 18 mL/min — ABNORMAL LOW (ref >90)
Potassium: 4.1 mEq/L (ref 3.5–5.1)
Sodium: 136 mEq/L (ref 135–145)

## 2012-03-25 LAB — CBC
MCV: 92 fL (ref 78.0–100.0)
Platelets: 228 10*3/uL (ref 150–400)
RBC: 2.89 MIL/uL — ABNORMAL LOW (ref 4.22–5.81)
WBC: 4.6 10*3/uL (ref 4.0–10.5)

## 2012-03-25 LAB — PREPARE FRESH FROZEN PLASMA: Unit division: 0

## 2012-03-25 LAB — PROTIME-INR: INR: 1.8 — ABNORMAL HIGH (ref 0.00–1.49)

## 2012-03-25 MED ORDER — HYDROCORTISONE 0.5 % EX CREA
TOPICAL_CREAM | Freq: Three times a day (TID) | CUTANEOUS | Status: DC
  Administered 2012-03-25 – 2012-03-27 (×7): via TOPICAL
  Filled 2012-03-25 (×2): qty 28.35

## 2012-03-25 MED ORDER — DIPHENHYDRAMINE HCL 25 MG PO CAPS
25.0000 mg | ORAL_CAPSULE | Freq: Four times a day (QID) | ORAL | Status: DC | PRN
  Administered 2012-03-25: 25 mg via ORAL
  Filled 2012-03-25: qty 1

## 2012-03-25 MED ORDER — WARFARIN SODIUM 2.5 MG PO TABS
2.5000 mg | ORAL_TABLET | Freq: Once | ORAL | Status: AC
  Administered 2012-03-25: 2.5 mg via ORAL
  Filled 2012-03-25: qty 1

## 2012-03-25 NOTE — Progress Notes (Signed)
IM Attending  Slightly better but focal pain and gross hematuria persists.  Hgb is stable at 9 and INR is down to 1.8.  Temp and VS are OK.  We may be close to being able to monitor this safely as out-patient.

## 2012-03-25 NOTE — Progress Notes (Signed)
Subjective: 1 more unit of FFP yesterday. Still has gross hematuria.  Abdominal pain still present unless completely still but somewhat imrpoved.  Pt also c/o nonpruritic rash around b/l buttock and lumbar spine.  Just noticed it today.  Objective: Vital signs in last 24 hours: Filed Vitals:   03/24/12 1545 03/24/12 1643 03/24/12 2156 03/25/12 0552  BP: 129/82 135/87 133/82 143/79  Pulse: 93 98 86 102  Temp: 98.3 F (36.8 C) 98.8 F (37.1 C) 98.8 F (37.1 C) 98.9 F (37.2 C)  TempSrc: Oral Oral Oral Oral  Resp: 18 18 19 20   Height:   6\' 4"  (1.93 m)   Weight:   212 lb 15.4 oz (96.6 kg)   SpO2:  95% 92% 96%   Weight change:   Intake/Output Summary (Last 24 hours) at 03/25/12 0741 Last data filed at 03/25/12 0552  Gross per 24 hour  Intake   1303 ml  Output   1875 ml  Net   -572 ml   Physical Exam: GEN: NAD.  Alert and oriented x 3.  Pleasant, conversant, and cooperative to exam. RESP:  CTAB, no w/r/r CARDIOVASCULAR: RRR, S1, S2, no m/r/g ABDOMEN: persistent TTP in LLQ, also in LUQ (not as bad), no distention, + rebound EXT: warm and dry. No edema in b/l LE Skin: raised linear lesions on b/l buttock and lumbar spine, no exudate.    Lab Results: Basic Metabolic Panel:  Lab [id number] 0605 03/24/12 0610 03/22/12 1707  NA 136 133* --  K 4.1 3.6 --  CL 99 99 --  CO2 22 19 --  GLUCOSE 100* 97 --  BUN 52* 54* --  CREATININE 4.49* 4.70* --  CALCIUM 8.0* 7.5* --  MG -- -- --  PHOS -- -- 6.5*   Liver Function Tests:  Lab 03/22/12 1707  AST 16  ALT 13  ALKPHOS 73  BILITOT 0.2*  PROT 7.4  ALBUMIN 3.0*   CBC:  Lab 03/25/12 0605 03/24/12 0610 03/22/12 1326  WBC 4.6 5.5 --  NEUTROABS -- -- 7.7  HGB 9.0* 8.9* --  HCT 26.6* 26.1* --  MCV 92.0 90.3 --  PLT 228 196 --   Coagulation:  Lab 03/25/12 0605 03/24/12 1613 03/24/12 0610 03/23/12 1706  LABPROT 21.2* 25.5* 29.1* 29.0*  INR 1.80* 2.28* 2.70* 2.69*   Urine Drug Screen: Drugs of Abuse     Component Value  Date/Time   LABOPIA NEGATIVE 12/20/2011 1655   LABOPIA POSITIVE* 09/22/2010 [place] 12/20/2011 1655   COCAINSCRNUR NONE DETECTED 09/22/2010 1140   LABBENZ NEGATIVE 12/20/2011 1655   LABBENZ NEG 07/30/2011 1656   LABBENZ POSITIVE* 09/22/2010 1140   AMPHETMU NEG 07/30/2011 1656   AMPHETMU NONE DETECTED 09/22/2010 1140   THCU NEGATIVE 12/20/2011 1655   THCU NONE DETECTED 09/22/2010 1140   LABBARB NEGATIVE 12/20/2011 1655   LABBARB  Value: NONE DETECTED        DRUG SCREEN FOR MEDICAL PURPOSES ONLY.  IF CONFIRMATION IS NEEDED FOR ANY PURPOSE, NOTIFY LAB WITHIN 5 DAYS.        LOWEST DETECTABLE LIMITS FOR URINE DRUG SCREEN Drug Class       Cutoff (ng/mL) Amphetamine      1000 Barbiturate      200 Benzodiazepine   [id number] Tricyclics       [id number] Opiates          300 Cocaine          300 THC  50 09/22/2010 1140    Urinalysis:  Lab 03/22/12 1041  COLORURINE RED*  LABSPEC 1.015  PHURINE 5.5  GLUCOSEU NEGATIVE  HGBUR LARGE*  BILIRUBINUR MODERATE*  KETONESUR 15*  PROTEINUR >300*  UROBILINOGEN 0.2  NITRITE NEGATIVE  LEUKOCYTESUR SMALL*    Micro Results: Recent Results (from the past 240 hour(s))  URINE CULTURE     Status: Normal   Collection Time   03/22/12 10:41 AM      Component Value Range Status Comment   Specimen Description URINE, RANDOM   Final    Special Requests NONE   Final    Culture  Setup Time [id number]   Final    Colony Count NO GROWTH   Final    Culture NO GROWTH   Final    Report Status 03/24/2012 FINAL   Final   MRSA PCR SCREENING     Status: Normal   Collection Time   03/22/12  5:12 PM      Component Value Range Status Comment   MRSA by PCR NEGATIVE  NEGATIVE  Final   CLOSTRIDIUM DIFFICILE BY PCR     Status: Normal   Collection Time   03/23/12 12:13 PM      Component Value Range Status Comment   C difficile by pcr NEGATIVE  NEGATIVE  Final    Studies/Results: No results found. Medications: I have reviewed the patient's current medications. Scheduled  Meds:    . amLODipine  10 mg Oral Daily  . antiseptic oral rinse  15 mL Mouth Rinse q12n4p  . chlorhexidine  15 mL Mouth Rinse BID  . efavirenz  600 mg Oral QHS  . emtricitabine-tenofovir  1 tablet Oral 3 times weekly  . nicotine  21 mg Transdermal Daily  . paricalcitol  1 mcg Oral Q M,W,F  . sodium bicarbonate  1,300 mg Oral BID  . sodium chloride  3 mL Intravenous Q12H  . warfarin  1 mg Oral ONCE-1800  . warfarin   Does not apply Once  . Warfarin - Pharmacist Dosing Inpatient   Does not apply q1800  . white petrolatum       Continuous Infusions:    . DISCONTD: sodium chloride Stopped (03/24/12 0952)   PRN Meds:.acetaminophen, acetaminophen, alum & mag hydroxide-simeth, HYDROcodone-acetaminophen, HYDROmorphone (DILAUDID) injection, ondansetron (ZOFRAN) IV, ondansetron  Assessment/Plan: # Gross Hematuria  Got one more unit of FFP yesterday, INR now down to 1.80.  Hb stable, but pt still with gross hematuria. Could all be 2/2 ruptured cyst, urology agrees with medical management. 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  Was 7.7 on admission, now 1.8.  Want to keep therapeutic given recent AVF placement and h/o clots (DVT and PE).  Still unclear why INR spiked in the first place. - consider 1 more unit FFP given persistent hematuria and abdominal pain.  # Abdominal Pain  Con't LLQ pain, though mildly improved.  Unclear if a ruptured cyst could account for all CT findings.  No leukocytosis, no UTI. CT abdomen shows perinephic stranding and fluid around psoas on L. Ddx includes gastroenteritis, ruptured renal cyst, no stones seen on CT. - con't to monitor  - c diff negative - morphine, vicodin - consider general surgery consult? - consider repeat CT scan?  # Rash Nonpruritic lesions on b/l buttock and lumbar spine are linear, erythematous, and well demarcated.  Looks like hypersensitivity  reaction, possibly to sheets, as pt does  not wear underwear.  No new meds, no abx.  Not excoriated.  Does not appear to be related to underlying process. - shower - benadryl - topical hydrocortisone  # AKI on PCKD  Recently s/p R AVF placement for eventual initiation of dialysis. Has had kidney problems since 2006. Cr trending down.  Baseline around 4-4.5.  - bmet in a.m.  - con't amlodipine  - hold lasix, consider restarting at lower dose and titrating up  # Elevated Anion Gap  Stable at 15,, likely 2/2 CKD.  Lactate wnl. - con't PO intake - trend bmet   # HIV  Well controlled.  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 Was stable in 2012, is overdue for f/u exam in 2013.  - consider CT chest, but need to consider contrast given AKI/CKD  # Ppx  - scd's   LOS: 3 days   [facility], BEN 03/25/2012, 7:41 AM
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
## 2012-03-26 LAB — BASIC METABOLIC PANEL
CO2: 21 mEq/L (ref 19–32)
Chloride: 100 mEq/L (ref 96–112)
Glucose, Bld: 94 mg/dL (ref 70–99)
Potassium: 4.2 mEq/L (ref 3.5–5.1)
Sodium: 134 mEq/L — ABNORMAL LOW (ref 135–145)

## 2012-03-26 LAB — CBC
Hemoglobin: 8.3 g/dL — ABNORMAL LOW (ref 13.0–17.0)
Platelets: 249 10*3/uL (ref 150–400)
RBC: 2.7 MIL/uL — ABNORMAL LOW (ref 4.22–5.81)
WBC: 4.9 10*3/uL (ref 4.0–10.5)

## 2012-03-26 LAB — PROTIME-INR
INR: 1.52 — ABNORMAL HIGH (ref 0.00–1.49)
Prothrombin Time: 18.6 seconds — ABNORMAL HIGH (ref 11.6–15.2)

## 2012-03-26 MED ORDER — WARFARIN SODIUM 2.5 MG PO TABS
2.5000 mg | ORAL_TABLET | Freq: Once | ORAL | Status: AC
  Administered 2012-03-26: 2.5 mg via ORAL
  Filled 2012-03-26: qty 1

## 2012-03-26 NOTE — Progress Notes (Signed)
Subjective: Still with gross hematuria.  Abdominal pain improving.  Rash also improving.  Objective: Vital signs in last 24 hours: Filed Vitals:   03/25/12 0943 03/25/12 1800 03/26/12 0016 03/26/12 0545  BP: 135/87 120/82 133/85 134/81  Pulse: 98 102 95 86  Temp: 98 F (36.7 C) 98 F (36.7 C) 98 F (36.7 C) 97.8 F (36.6 C)  TempSrc: Oral Oral Oral Oral  Resp: 20 20 20 18   Height:   6\' 4"  (1.93 m)   Weight:   212 lb 11.9 oz (96.5 kg)   SpO2: 98% 98% 93% 94%   Weight change: -3.5 oz (-0.1 kg)  Intake/Output Summary (Last 24 hours) at 03/26/12 0824 Last data filed at 03/26/12 0415  Gross per 24 hour  Intake    840 ml  Output   2950 ml  Net  -2110 ml   Physical Exam: GEN: NAD.  Alert and oriented x 3.  Pleasant, conversant, and cooperative to exam. RESP:  CTAB, no w/r/r CARDIOVASCULAR: RRR, S1, S2, no m/r/g ABDOMEN: improving TTP in LLQ, also in LUQ (not as bad), no distention EXT: warm and dry. No edema in b/l LE Skin: raised linear lesions on b/l buttock and lumbar spine are much improved, now faint  Lab Results: Basic Metabolic Panel:  Lab [id number] 0550 03/25/12 0605 03/22/12 1707  NA 134* 136 --  K 4.2 4.1 --  CL 100 99 --  CO2 21 22 --  GLUCOSE 94 100* --  BUN 50* 52* --  CREATININE 4.07* 4.49* --  CALCIUM 7.5* 8.0* --  MG -- -- --  PHOS -- -- 6.5*   Liver Function Tests:  Lab 03/22/12 1707  AST 16  ALT 13  ALKPHOS 73  BILITOT 0.2*  PROT 7.4  ALBUMIN 3.0*   CBC:  Lab 03/26/12 0550 03/25/12 0605 03/22/12 1326  WBC 4.9 4.6 --  NEUTROABS -- -- 7.7  HGB 8.3* 9.0* --  HCT 24.7* 26.6* --  MCV 91.5 92.0 --  PLT 249 228 --   Coagulation:  Lab 03/26/12 0550 03/25/12 0605 03/24/12 1613 03/24/12 0610  LABPROT 18.6* 21.2* 25.5* 29.1*  INR 1.52* 1.80* 2.28* 2.70*   Urinalysis:  Lab 03/22/12 1041  COLORURINE RED*  LABSPEC 1.015  PHURINE 5.5  GLUCOSEU NEGATIVE  HGBUR LARGE*  BILIRUBINUR MODERATE*  KETONESUR 15*  PROTEINUR >300*  UROBILINOGEN  0.2  NITRITE NEGATIVE  LEUKOCYTESUR SMALL*    Micro Results: Recent Results (from the past 240 hour(s))  URINE CULTURE     Status: Normal   Collection Time   03/22/12 10:41 AM      Component Value Range Status Comment   Specimen Description URINE, RANDOM   Final    Special Requests NONE   Final    Culture  Setup Time [id number]   Final    Colony Count NO GROWTH   Final    Culture NO GROWTH   Final    Report Status 03/24/2012 FINAL   Final   MRSA PCR SCREENING     Status: Normal   Collection Time   03/22/12  5:12 PM      Component Value Range Status Comment   MRSA by PCR NEGATIVE  NEGATIVE  Final   CLOSTRIDIUM DIFFICILE BY PCR     Status: Normal   Collection Time   03/23/12 12:13 PM      Component Value Range Status Comment   C difficile by pcr NEGATIVE  NEGATIVE  Final    Studies/Results: No results found.  Medications: I have reviewed the patient's current medications. Scheduled Meds:    . amLODipine  10 mg Oral Daily  . antiseptic oral rinse  15 mL Mouth Rinse q12n4p  . efavirenz  600 mg Oral QHS  . emtricitabine-tenofovir  1 tablet Oral 3 times weekly  . hydrocortisone cream   Topical TID  . nicotine  21 mg Transdermal Daily  . paricalcitol  1 mcg Oral Q M,W,F  . sodium bicarbonate  1,300 mg Oral BID  . sodium chloride  3 mL Intravenous Q12H  . warfarin  2.5 mg Oral ONCE-1800  . warfarin   Does not apply Once  . Warfarin - Pharmacist Dosing Inpatient   Does not apply q1800  . DISCONTD: chlorhexidine  15 mL Mouth Rinse BID   Continuous Infusions:   PRN Meds:.acetaminophen, acetaminophen, alum & mag hydroxide-simeth, diphenhydrAMINE, HYDROcodone-acetaminophen, HYDROmorphone (DILAUDID) injection, ondansetron (ZOFRAN) IV, ondansetron  Assessment/Plan: # Gross Hematuria  INR now down to 1.50 off coumadin.  Hb stable, but down slightly today. Could all be 2/2 ruptured cyst, urology agrees with medical management.  Would like to have INR therapeutic given recent AVF  placement and h/o clots, but would like to see Hb remain stable. 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  Was 7.7 on admission, now 1.5.  Want to keep therapeutic given recent AVF placement and h/o clots (DVT and PE).  Still unclear why INR spiked in the first place. - discuss when to restart coumadin  # Abdominal Pain  Improving LLQ pain.  No leukocytosis, no UTI. CT abdomen shows perinephic stranding and fluid around psoas on L. Ddx includes gastroenteritis, ruptured renal cyst, no stones seen on CT. - con't to monitor  - c diff negative - morphine, vicodin  # Rash Nonpruritic lesions on b/l buttock and lumbar spine are linear, erythematous, and well demarcated.  Much improved today after benadryl and hydrocortisone cream.  Looks like hypersensitivity reaction, possibly to sheets, as pt does not wear underwear. 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 Has had kidney problems since 2006. Cr now at baseline - bmet in a.m.  - con't amlodipine  - hold lasix, consider restarting at lower dose and titrating up  # Elevated Anion Gap  Stable, likely 2/2 CKD.  Lactate wnl. - con't PO intake - trend bmet   # HIV  Well controlled.  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 Was stable in 2012, is overdue for f/u exam in 2013.  - consider CT chest, but need to consider contrast given AKI/CKD  # Ppx  - scd's   LOS: 4 days   [facility], BEN 03/26/2012, 8:24 AM
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
## 2012-03-26 NOTE — Progress Notes (Signed)
Case Manager continues to follow for d/c needs. Will assist with any needs.  Jasmine Pang RN MPH Case Manager [phone]

## 2012-03-27 LAB — PROTIME-INR
INR: 1.39 (ref 0.00–1.49)
Prothrombin Time: 17.3 seconds — ABNORMAL HIGH (ref 11.6–15.2)

## 2012-03-27 LAB — CBC
Hemoglobin: 8.4 g/dL — ABNORMAL LOW (ref 13.0–17.0)
MCH: 30.7 pg (ref 26.0–34.0)
MCHC: 33.3 g/dL (ref 30.0–36.0)
RDW: 12.7 % (ref 11.5–15.5)

## 2012-03-27 LAB — BASIC METABOLIC PANEL
BUN: 55 mg/dL — ABNORMAL HIGH (ref 6–23)
Calcium: 8 mg/dL — ABNORMAL LOW (ref 8.4–10.5)
GFR calc Af Amer: 19 mL/min — ABNORMAL LOW (ref >90)
GFR calc non Af Amer: 16 mL/min — ABNORMAL LOW (ref >90)
Glucose, Bld: 90 mg/dL (ref 70–99)
Potassium: 4.5 mEq/L (ref 3.5–5.1)
Sodium: 137 mEq/L (ref 135–145)

## 2012-03-27 MED ORDER — WARFARIN SODIUM 5 MG PO TABS: 5.0000 mg | ORAL_TABLET | Freq: Every day | ORAL | Status: DC

## 2012-03-27 MED ORDER — WARFARIN SODIUM 5 MG PO TABS
5.0000 mg | ORAL_TABLET | Freq: Once | ORAL | Status: DC
  Filled 2012-03-27: qty 1

## 2012-03-27 MED ORDER — OXYCODONE-ACETAMINOPHEN 10-325 MG PO TABS: 1.0000 | ORAL_TABLET | Freq: Four times a day (QID) | ORAL | Status: DC | PRN

## 2012-03-27 NOTE — Progress Notes (Signed)
ANTICOAGULATION CONSULT NOTE - Follow Up Consult  Pharmacy Consult for coumadin Indication: Hx DVT/PE  No Known Allergies  Patient Measurements: Height: 6\' 4"  (193 cm) Weight: 212 lb 11.9 oz (96.5 kg) IBW/kg (Calculated) : 86.8  Heparin Dosing Weight:   Vital Signs: Temp: 97.6 F (36.4 C) (04/12 0937) Temp src: Oral (04/12 0505) BP: 117/75 mmHg (04/12 0937) Pulse Rate: 95  (04/12 0937)  Labs:  Basename 03/27/12 0625 03/26/12 0550 03/25/12 0605  HGB 8.4* 8.3* --  HCT 25.2* 24.7* 26.6*  PLT 281 249 228  APTT -- -- --  LABPROT 17.3* 18.6* 21.2*  INR 1.39 1.52* 1.80*  HEPARINUNFRC -- -- --  CREATININE 4.32* 4.07* 4.49*  CKTOTAL -- -- --  CKMB -- -- --  TROPONINI -- -- --   Estimated Creatinine Clearance: 28.7 ml/min (by C-G formula based on Cr of 4.32).  Assessment: Patient is a 36 y.o M on coumadin for hx PE/DVT.  He stated that his hematuria is similar to yesterday. Medical team suspects hematuria may be secondary to ruptured cyst and plan on continuing anticoagulation for now due to recent AVF placement and history of clots.  CBC is table today.  INR continues to decrease to 1.39.  Patient stated that he was on coumadin 10mg  daily except 15mg  on Wednesdays regimen at home.  He also reported that prior to admission he had a GI bug, had poor PO intake, and was taking additional doses of his medications because he though he was throwing them up.  This may explain his supratherapeutic INR on admission.  Goal of Therapy:  INR 2-3   Plan:  Plan:  1) coumadin 5mg  POx1 today   Camaryn Lumbert P 03/27/2012,12:04 PM

## 2012-03-27 NOTE — Discharge Summary (Signed)
Internal [facility] Discharge Note  Name: Brian [name] MRN: [id number] DOB: September 06, 1976 36 y.o.  Date of Admission: 03/22/2012 10:25 AM Date of Discharge: 03/27/2012 Attending Physician: Milta Deiters, MD  Discharge Diagnosis: Principal Problem:  Hematuria, gross Active Problems:  HIV INFECTION  ESSENTIAL HYPERTENSION  POLYCYSTIC KIDNEY DISEASE  Diarrhea  Supratherapeutic INR  Acute-on-chronic kidney injury   Discharge Medications: Medication List  As of 03/27/2012  2:34 PM   STOP taking these medications         HYDROcodone-acetaminophen 5-500 MG per tablet         TAKE these medications         amLODipine 10 MG tablet   Commonly known as: NORVASC   Take 10 mg by mouth daily.      efavirenz 600 MG tablet   Commonly known as: SUSTIVA   Take 600 mg by mouth at bedtime.      emtricitabine-tenofovir 200-300 MG per tablet   Commonly known as: TRUVADA   Take 1 tablet by mouth daily. Take one tablet on Mon, Wed, and Friday      furosemide 80 MG tablet   Commonly known as: LASIX   Take 160 mg by mouth 2 (two) times daily.      oxyCODONE-acetaminophen 10-325 MG per tablet   Commonly known as: PERCOCET   Take 1-2 tablets by mouth every 6 (six) hours as needed for pain.      paricalcitol 1 MCG capsule   Commonly known as: ZEMPLAR   Take 1 mcg by mouth every Monday, Wednesday, and Friday.      sodium bicarbonate 650 MG tablet   Take 1,300 mg by mouth 2 (two) times daily.      warfarin 5 MG tablet   Commonly known as: COUMADIN   Take 5 mg by mouth as directed.      warfarin 5 MG tablet   Commonly known as: COUMADIN   Take 1 tablet (5 mg total) by mouth daily. Take 5mg  daily until you see Dr. Elie Confer on Monday.            Disposition and follow-up:   Mr.[name] was discharged from [facility] in Stable condition.  PMD: 1. Check CBC.  If pt's CBC has dropped, he may need readmission or urology evaluation.  His Hb  has been stable, however if it becomes problematic, he may need a nephrectomy.  2. See how pt is doing with many f/u appts.  He is seeing Dr. Elie Confer for INR, surgery for AVF f/u, ID for HIV, and [place] for general care.  3. has thoracic aortic aneurysm that is followed annually. He is due for this year his CT scan. He did not get one during admission because of acute kidney injury. This can be considered in the next few months and should probably be ordered. Unclear if her requires contrast to monitor the aneurysm, which may complicate things in the setting of is pending end stage renal disease.  Follow-up Appointments: Follow-up Information    Follow up with [name], PHARMD on 03/30/2012. (11:15am)    Contact information:   [place] [phone]       Follow up with Princess Perna, NP on 04/01/2012. (2:00pm)    Contact information:   [place] [place] [phone]       Follow up with Scharlene Gloss, MD on 04/02/2012. (3:15pm)    Contact information:  [address] [place] [phone]       Follow up with Milana Obey, MD on 04/01/2012. (10:45am)    Contact information:   [place] [phone]         Discharge Orders    Future Appointments: Provider: Department: Dept Phone: Center:   03/30/2012 11:15 AM [facility] [id number] [facility]   04/01/2012 10:45 AM Heinz Knuckles, MD [facility] [phone] [facility]   04/01/2012 2:00 PM Princess Perna, NP [facility] [phone] [facility]   04/02/2012 3:15 PM Thayer Headings, MD [facility] [phone] [facility]   04/10/2012 2:15 PM Hans Eden, MD [facility] [phone] [facility]     Future Orders Please Complete By Expires   Increase activity slowly         Consultations: Treatment Team:  Fredricka Bonine, MD  Procedures Performed:  Ct Abdomen  Pelvis Wo Contrast  03/22/2012  *RADIOLOGY REPORT*  Clinical Data: Left flank pain, hematuria  CT ABDOMEN AND PELVIS WITHOUT CONTRAST  Technique:  Multidetector CT imaging of the abdomen and pelvis was performed following the standard protocol without intravenous contrast.  Comparison: 09/21/2010  Findings: Lung bases shows patchy atelectasis or infiltrate left lower lobe posteriorly.  Innumerable hepatic cysts are again noted without significant change from prior exam. Small left pleural effusion.  Again noted markedly enlarged bilateral kidneys with polycystic kidney disease.  The largest cyst in the lower pole of the right kidney anterior aspect measures 9.5 cm.  The largest cyst in the mid pole of the left kidney measures 6.6 cm.  Scattered bilateral calcifications are again noted.  The largest of calcified calculus mid pole of the left kidney measures 6 mm.  Largest calcified calculus in upper pole of the right kidney measures 0.5 mm.  There is mild left perinephric stranding.  No hydronephrosis or hydroureter.  Small amount of stranding / fluid is noted left flank region just behind the descending colon best seen in axial image 57.  Bilateral visualized ureter is unremarkable without evidence of calcified calculi.  No calcified calculi are noted within the urinary bladder.  Again noted bilateral some of the cysts with high density material content probable proteinaceous material without significant change from prior exam.  On the coronal images right kidney measures 24.1 x 13.8 cm.  The left kidney measures 24.2 x 14.8 cm.  Linear calcifications in the peripheral wall of the cyst in the lower pole of the right kidney is stable.  Sagittal images of the spine shows no destructive bony lesions. Stable mild compression deformity upper endplate of L2 vertebral body.  No small bowel obstruction.  No aortic aneurysm.  Small amount of stranding noted anterior to the left psoas muscle in the left pelvis.  IMPRESSION:   1. Left lung base posterior atelectasis or infiltrate.  Small left pleural effusion. 2.  Again noted innumerable hepatic cysts grossly stable from prior exam.  3.  Again noted bilateral mild enlarged polycystic kidneys. Scattered parenchymal calcification bilateral renal are stable from prior exam.  The largest calcification in the in the mid pole of the left kidney measures 6 mm.  Largest calcification mid pole of the right kidney measures 6.5 mm.  There is mild left posterolateral perinephric stranding.  A small amount of fluid is noted in the left flank paracolic gutter.  There is small amount of stranding anterior to left psoas muscle. 4.  No hydronephrosis or hydroureter.  No definite calcified ureteral calculi are noted. 5.  No calcified calculi are noted within urinary bladder. 6.  Stable mild compression deformity upper endplate of the L2 vertebral body.  Original Report Authenticated By: Lahoma Crocker, M.D.    Admission HPI: Pt is a 36 yo M with a h/o PCKD and CKD, HIV (CD4 720 in 03/2012, VL<20 in 11/2011), chronic anemia, h/o PE and DVT on chronic coumadin p/w hematuria. Pt reports 3 days of hematuria without dysuria or urinary frequency. His urine slowly turned bright red over the past three days.  He also has LLQ abdominal pain, watery diarrhea w/o blood (greater than 10 episodes per day), and subjective fevers over a similar duration. Defecation improves his abdominal pain, which feels like a fullness that is about to pop. He has a decreased appetite but is drinking fluids ok.  He denies N/V, shortness of breath, but described a small chest discomfort yesterday when lying on his side.  Notably, pt was last seen in anticoagulation clinic and found to have an INR of 3.4, and his dose was reduced.   Hospital Course by problem list: # Gross Hematuria  Patient presented with gross hematuria for a few days in the setting of an INR of 7.7. The patient was given FFP and his INR eventually normalized.  His hematuria decreased slightly but did not stop during the duration of admission. It was thought secondary to his PCKD and possibly a ruptured cyst.  Urology was consulted and agreed with supportive care. The patient's hemoglobin was stable at discharge. He will followup with internal medicine clinic for recheck of CBC. If he has worsening anemia, he may need evaluation by urology for possible nephrectomy. Coumadin should be continued in this patient with a history of clotting because of his recent fistula placement. When considering stopping Coumadin, it was felt that the risks of losing the fistula outweighed the benefit of stopping hematuria.  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 INR was 7.7 on admission, possibly secondary to a diarrheal illness. The patient reported good compliance with his Coumadin. He was given a total of 5 units of FFP and his INR normalized. His Coumadin was restarted at discharge, and he will followup with the anticoagulation clinic. He was discharged on a slightly lower dose of Coumadin that can be adjusted as an outpatient.    # Abdominal Pain  Patient presented with LLQ pain that improved over the course of admission with supportive care.. No leukocytosis, no UTI. CT abdomen showed perinephic stranding and fluid around psoas on L. Ddx included gastroenteritis, ruptured renal cyst. Urology was consulted and felt that it ruptured cyst would explain the patient's hematuria abdominal pain. If either anemia or pain became debilitating, nephrectomy could be considered.  # Rash  Partway through the admission, patient had nonpruritic lesions on b/l buttock and lumbar spine were linear, erythematous, and well demarcated. There is soft with Benadryl and topical hydrocortisone. Most likely a hypersensitivity reaction.   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 Has had kidney problems since 2006. Initially  presented with a elevation in creatinine had normalized with gentle fluids. His Lasix was restarted at discharge.  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 Was stable in 2012, is overdue for f/u exam in 2013.  Did not get a CT this admission because of her AKI.  #  HIV Meds were continued.   Discharge Vitals:  BP 112/68  Pulse 98  Temp(Src) 98.1 F (36.7 C) (Oral)  Resp 17  Ht 6\' 4"  (1.93 m)  Wt 212 lb 11.9 oz (96.5 kg)  BMI 25.90 kg/m2  SpO2 96%  Discharge Labs:  Results for orders placed during the hospital encounter of 03/22/12 (from the past 24 hour(s))  PROTIME-INR     Status: Abnormal   Collection Time   03/27/12  6:25 AM      Component Value Range   Prothrombin Time 17.3 (*) 11.6 - 15.2 (seconds)   INR 1.39  0.00 - 1.49   CBC     Status: Abnormal   Collection Time   03/27/12  6:25 AM      Component Value Range   WBC 4.7  4.0 - 10.5 (K/uL)   RBC 2.74 (*) 4.22 - 5.81 (MIL/uL)   Hemoglobin 8.4 (*) 13.0 - 17.0 (g/dL)   HCT 25.2 (*) 39.0 - 52.0 (%)   MCV 92.0  78.0 - 100.0 (fL)   MCH 30.7  26.0 - 34.0 (pg)   MCHC 33.3  30.0 - 36.0 (g/dL)   RDW 12.7  11.5 - 15.5 (%)   Platelets 281  150 - 400 (K/uL)  BASIC METABOLIC PANEL     Status: Abnormal   Collection Time   03/27/12  6:25 AM      Component Value Range   Sodium 137  135 - 145 (mEq/L)   Potassium 4.5  3.5 - 5.1 (mEq/L)   Chloride 102  96 - 112 (mEq/L)   CO2 21  19 - 32 (mEq/L)   Glucose, Bld 90  70 - 99 (mg/dL)   BUN 55 (*) 6 - 23 (mg/dL)   Creatinine, Ser 4.32 (*) 0.50 - 1.35 (mg/dL)   Calcium 8.0 (*) 8.4 - 10.5 (mg/dL)   GFR calc non Af Amer 16 (*) >90 (mL/min)   GFR calc Af Amer 19 (*) >90 (mL/min)    SignedDorthula Nettles, BEN 03/27/2012, 2:34 PM

## 2012-03-27 NOTE — Progress Notes (Signed)
   CARE MANAGEMENT NOTE 03/27/2012  Patient:  MADDON, KINNEMAN   Account Number:  [id number]  Date Initiated:  03/23/2012  Documentation initiated by:  Mariyah Upshaw  Subjective/Objective Assessment:   Request for assistance with medication.     Action/Plan:   Noted per last admission that pt had applied for Medicaid with assistance of finace consellor and has pending number will check with pt re card and ability to purchase medications.   Anticipated DC Date:  03/28/2012   Anticipated DC Plan:  HOME/SELF CARE         Choice offered to / List presented to:             Status of service:  Completed, signed off Medicare Important Message given?   (If response is "NO", the following Medicare IM given date fields will be blank) Date Medicare IM given:   Date Additional Medicare IM given:    Discharge Disposition:  HOME/SELF CARE  Per UR Regulation:    If discussed at Long Length of Stay Meetings, dates discussed:    Comments:

## 2012-03-27 NOTE — Progress Notes (Signed)
Subjective: Still with gross hematuria.  Abdominal pain improving.  Rash subsided.  Objective: Vital signs in last 24 hours: Filed Vitals:   03/26/12 1800 03/26/12 2100 03/27/12 0505 03/27/12 0937  BP: 125/84 132/79 123/81 117/75  Pulse: 90 95 80 95  Temp: 98 F (36.7 C) 98.6 F (37 C) 97.9 F (36.6 C) 97.6 F (36.4 C)  TempSrc: Oral Oral Oral   Resp: 20 20 18 18   Height:      Weight:      SpO2: 98% 98% 98% 97%   Weight change:   Intake/Output Summary (Last 24 hours) at 03/27/12 1352 Last data filed at 03/27/12 0937  Gross per 24 hour  Intake    480 ml  Output   3125 ml  Net  -2645 ml   Physical Exam: GEN: NAD.  Alert and oriented x 3.  Pleasant, conversant, and cooperative to exam. RESP:  CTAB, no w/r/r CARDIOVASCULAR: RRR, S1, S2, no m/r/g ABDOMEN: improving TTP in LLQ, also in LUQ (not as bad), no distention EXT: warm and dry. No edema in b/l LE  Lab Results: Basic Metabolic Panel:  Lab [id number] 03/26/12 0550 03/22/12 1707  NA 137 134* --  K 4.5 4.2 --  CL 102 100 --  CO2 21 21 --  GLUCOSE 90 94 --  BUN 55* 50* --  CREATININE 4.32* 4.07* --  CALCIUM 8.0* 7.5* --  MG -- -- --  PHOS -- -- 6.5*   Liver Function Tests:  Lab 03/22/12 1707  AST 16  ALT 13  ALKPHOS 73  BILITOT 0.2*  PROT 7.4  ALBUMIN 3.0*   CBC:  Lab 03/27/12 0625 03/26/12 0550 03/22/12 1326  WBC 4.7 4.9 --  NEUTROABS -- -- 7.7  HGB 8.4* 8.3* --  HCT 25.2* 24.7* --  MCV 92.0 91.5 --  PLT 281 249 --   Coagulation:  Lab 03/27/12 0625 03/26/12 0550 03/25/12 0605 03/24/12 1613  LABPROT 17.3* 18.6* 21.2* 25.5*  INR 1.39 1.52* 1.80* 2.28*   Urinalysis:  Lab 03/22/12 1041  COLORURINE RED*  LABSPEC 1.015  PHURINE 5.5  GLUCOSEU NEGATIVE  HGBUR LARGE*  BILIRUBINUR MODERATE*  KETONESUR 15*  PROTEINUR >300*  UROBILINOGEN 0.2  NITRITE NEGATIVE  LEUKOCYTESUR SMALL*    Micro Results: Recent Results (from the past 240 hour(s))  URINE CULTURE     Status: Normal   Collection Time   03/22/12 10:41 AM      Component Value Range Status Comment   Specimen Description URINE, RANDOM   Final    Special Requests NONE   Final    Culture  Setup Time [id number]   Final    Colony Count NO GROWTH   Final    Culture NO GROWTH   Final    Report Status 03/24/2012 FINAL   Final   MRSA PCR SCREENING     Status: Normal   Collection Time   03/22/12  5:12 PM      Component Value Range Status Comment   MRSA by PCR NEGATIVE  NEGATIVE  Final   CLOSTRIDIUM DIFFICILE BY PCR     Status: Normal   Collection Time   03/23/12 12:13 PM      Component Value Range Status Comment   C difficile by pcr NEGATIVE  NEGATIVE  Final    Studies/Results: No results found. Medications: I have reviewed the patient's current medications. Scheduled Meds:    . amLODipine  10 mg Oral Daily  . antiseptic oral rinse  15 mL  Mouth Rinse q12n4p  . efavirenz  600 mg Oral QHS  . emtricitabine-tenofovir  1 tablet Oral 3 times weekly  . hydrocortisone cream   Topical TID  . nicotine  21 mg Transdermal Daily  . paricalcitol  1 mcg Oral Q M,W,F  . sodium bicarbonate  1,300 mg Oral BID  . sodium chloride  3 mL Intravenous Q12H  . warfarin  2.5 mg Oral ONCE-1800  . warfarin  5 mg Oral ONCE-1800  . warfarin   Does not apply Once  . Warfarin - Pharmacist Dosing Inpatient   Does not apply q1800   Continuous Infusions:   PRN Meds:.acetaminophen, acetaminophen, alum & mag hydroxide-simeth, diphenhydrAMINE, HYDROcodone-acetaminophen, HYDROmorphone (DILAUDID) injection, ondansetron (ZOFRAN) IV, ondansetron  Assessment/Plan: # Gross Hematuria  INR now down to 1.39 off coumadin.  Hb stable. Could all be 2/2 ruptured cyst, urology agrees with medical management.  Would like to have INR therapeutic given recent AVF placement and h/o clots, but would like to see Hb remain stable.  Urology agrees c supportive care unless Hb drops or pain increases.  Will d/c pt with close f/u.  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  Was 7.7 on admission, now 1.39.  Want to keep therapeutic given recent AVF placement and h/o clots (DVT and PE).  Still unclear why INR spiked in the first place. - con't coumadin  # Abdominal Pain  Improving LLQ pain.  No leukocytosis, no UTI. CT abdomen shows perinephic stranding and fluid around psoas on L. Ddx includes gastroenteritis, ruptured renal cyst, no stones seen on CT. - con't to monitor  - c diff negative - morphine, vicodin  # Rash Nonpruritic lesions on b/l buttock and lumbar spine are linear, erythematous, and well demarcated.  Now resolved.  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 Has had kidney problems since 2006. Cr now at baseline - bmet in a.m.  - con't amlodipine  - hold lasix, consider restarting at lower dose and titrating up  # Elevated Anion Gap  Stable, likely 2/2 CKD.  Lactate wnl. - con't PO intake - trend bmet   # HIV  Well controlled.  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 Was stable in 2012, is overdue for f/u exam in 2013.  - consider CT chest, but need to consider contrast given AKI/CKD  # Ppx  - scd's   LOS: 5 days   [facility], BEN 03/27/2012, 1:52 PM

## 2012-03-27 NOTE — Progress Notes (Signed)
Discussed discharge instructions/medications with pt. Pt showed no barriers to discharge. Assessment unchanged from morning. IV removed. Pt discharged to home with mother.

## 2012-03-30 ENCOUNTER — Ambulatory Visit (INDEPENDENT_AMBULATORY_CARE_PROVIDER_SITE_OTHER): Payer: Self-pay | Admitting: Pharmacist

## 2012-03-30 DIAGNOSIS — Z86718 Personal history of other venous thrombosis and embolism: Secondary | ICD-10-CM

## 2012-03-30 DIAGNOSIS — I2699 Other pulmonary embolism without acute cor pulmonale: Secondary | ICD-10-CM

## 2012-03-30 DIAGNOSIS — Z7901 Long term (current) use of anticoagulants: Secondary | ICD-10-CM

## 2012-03-30 NOTE — Patient Instructions (Signed)
Patient instructed to take medications as defined in the Anti-coagulation Track section of this encounter.  Patient instructed to take today's dose.  Patient verbalized understanding of these instructions.    

## 2012-03-30 NOTE — Progress Notes (Signed)
Anti-Coagulation Progress Note  Brian [name] is a 36 y.o. male who is currently on an anti-coagulation regimen.    RECENT RESULTS: Recent results are below, the most recent result is correlated with a dose of 75 mg. per week PRIOR to hospitalization:  Upon hospitalization--INR noted to be >7.0 with hematuria. Workup revealed polycystic kidney disease (prior problem) with potential of bleeding from one of the renal cysts. INR was attributed to 3 days PTA of diarrhea as per hospital admission note. He has been ONLY on 5mg  qd of warfarin SINCE discharge. Will re-escalate his dosing--still LESS than that prior to admission--but more consistent with previous documented dosing requirements. Patient to be seen in [facility] on 17-APR-13 by PCP for post-hospital follow up. Lab Results  Component Value Date   INR 1.90 03/30/2012   INR 1.39 03/27/2012   INR 1.52* 03/26/2012    ANTI-COAG DOSE:   Latest dosing instructions   Total Sun Mon Tue Wed Thu Fri Sat   62.5 10 mg 10 mg 7.5 mg 10 mg 7.5 mg 10 mg 7.5 mg    (5 mg2) (5 mg2) (5 mg1.5) (5 mg2) (5 mg1.5) (5 mg2) (5 mg1.5)         ANTICOAG SUMMARY: Anticoagulation Episode Summary              Current INR goal 2.0-3.0 Next INR check 04/06/2012   INR from last check 1.90! (03/30/2012)     Weekly max dose (mg)  Target end date Indefinite   Indications Long term current use of anticoagulant, PULMONARY EMBOLISM, History of DVT (deep vein thrombosis)   INR check location [facility] Preferred lab    Send INR reminders to ANTICOAG IMP   Comments Has had two episodes of VTE (1 index VTE and 1 subsequent bilateral PE). Indefinite therapy suggested.       Provider Role Specialty Phone number   Bartholomew Crews, MD  Internal Medicine [phone]        ANTICOAG TODAY: Anticoagulation Summary as of 03/30/2012              INR goal 2.0-3.0     Selected INR 1.90! (03/30/2012) Next INR check 04/06/2012   Weekly max dose (mg)  Target end date  Indefinite   Indications Long term current use of anticoagulant, PULMONARY EMBOLISM, History of DVT (deep vein thrombosis)    Anticoagulation Episode Summary              INR check location [facility] Preferred lab    Send INR reminders to ANTICOAG IMP   Comments Has had two episodes of VTE (1 index VTE and 1 subsequent bilateral PE). Indefinite therapy suggested.       Provider Role Specialty Phone number   Bartholomew Crews, MD  Internal Medicine [phone]        PATIENT INSTRUCTIONS: Patient Instructions  Patient instructed to take medications as defined in the Anti-coagulation Track section of this encounter.  Patient instructed to take today's dose.  Patient verbalized understanding of these instructions.        FOLLOW-UP Return in 7 days (on 04/06/2012) for Follow up INR.  Jorene Guest, III Pharm.D., CACP

## 2012-03-31 ENCOUNTER — Encounter: Payer: Self-pay | Admitting: Neurosurgery

## 2012-04-01 ENCOUNTER — Ambulatory Visit (INDEPENDENT_AMBULATORY_CARE_PROVIDER_SITE_OTHER): Payer: Self-pay | Admitting: Neurosurgery

## 2012-04-01 ENCOUNTER — Encounter: Payer: Self-pay | Admitting: Neurosurgery

## 2012-04-01 ENCOUNTER — Ambulatory Visit (INDEPENDENT_AMBULATORY_CARE_PROVIDER_SITE_OTHER): Payer: Self-pay | Admitting: Internal Medicine

## 2012-04-01 ENCOUNTER — Encounter: Payer: Self-pay | Admitting: Internal Medicine

## 2012-04-01 VITALS — BP 135/81 | HR 97 | Temp 97.0°F | Ht 76.0 in | Wt 214.1 lb

## 2012-04-01 VITALS — BP 125/83 | HR 97 | Resp 16 | Ht 75.0 in | Wt 213.0 lb

## 2012-04-01 DIAGNOSIS — I719 Aortic aneurysm of unspecified site, without rupture: Secondary | ICD-10-CM

## 2012-04-01 DIAGNOSIS — Q613 Polycystic kidney, unspecified: Secondary | ICD-10-CM

## 2012-04-01 DIAGNOSIS — N186 End stage renal disease: Secondary | ICD-10-CM

## 2012-04-01 DIAGNOSIS — Z716 Tobacco abuse counseling: Secondary | ICD-10-CM

## 2012-04-01 DIAGNOSIS — D649 Anemia, unspecified: Secondary | ICD-10-CM

## 2012-04-01 DIAGNOSIS — N184 Chronic kidney disease, stage 4 (severe): Secondary | ICD-10-CM

## 2012-04-01 LAB — CBC
MCH: 30.7 pg (ref 26.0–34.0)
MCV: 92.1 fL (ref 78.0–100.0)
Platelets: 410 10*3/uL — ABNORMAL HIGH (ref 150–400)
RDW: 12.9 % (ref 11.5–15.5)
WBC: 5.9 10*3/uL (ref 4.0–10.5)

## 2012-04-01 LAB — BASIC METABOLIC PANEL
Calcium: 8.4 mg/dL (ref 8.4–10.5)
Creat: 5.31 mg/dL — ABNORMAL HIGH (ref 0.50–1.35)
Sodium: 135 mEq/L (ref 135–145)

## 2012-04-01 MED ORDER — VARENICLINE TARTRATE 1 MG PO TABS: 1.0000 mg | ORAL_TABLET | Freq: Two times a day (BID) | ORAL | Status: DC

## 2012-04-01 MED ORDER — VARENICLINE TARTRATE 0.5 MG PO TABS: 0.5000 mg | ORAL_TABLET | Freq: Two times a day (BID) | ORAL | Status: AC

## 2012-04-01 MED ORDER — NICOTINE 14 MG/24HR TD PT24: 1.0000 | MEDICATED_PATCH | TRANSDERMAL | Status: DC
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
## 2012-04-01 NOTE — Patient Instructions (Signed)
Please, STOP SMOKING!!!!! Please, follow up with a [place] Smoking cessation program and pick up a prescription for a Nicoderm card.. Please, follow up with Dr. Clover Mealy

## 2012-04-01 NOTE — Progress Notes (Signed)
Subjective:     Patient ID: Brian [name], male   DOB: [birth year], 36 y.o.   MRN: [id number]  HPI: 36 year old male patient who was seen for new access per Dr. Scot Dock. He underwent a right brachiocephalic AV fistula [id number], he is now 6 weeks status post this procedure and doing well. He is not on dialysis yet but is still followed by renal to make a determination.   Review of Systems: 12 point review of systems is notable for the difficulties described above otherwise unremarkable     Objective:   Physical Exam: Well-developed well-nourished 36 year old male seen for end-stage renal disease and new right brachiocephalic AV fistula. There is a palpable thrill throughout the fistula, surgical wound is well healed there is no redness or drainage, patient reports no pain. Vital signs are stable, patient reports no new medical problems.     Assessment:     Assessment as above, right AV fistula with good thrill well healed from surgery.    Plan:     Spoke with Dr. Scot Dock regarding this patient, patient may start dialysis when ready and I will make a determination as to when to use this fistula, his followup-year-old be when necessary, his questions were encouraged and answered.  Beatris Ship ANP  Clinic M.D.: Scot Dock

## 2012-04-02 ENCOUNTER — Encounter: Payer: Self-pay | Admitting: Internal Medicine

## 2012-04-02 ENCOUNTER — Ambulatory Visit (INDEPENDENT_AMBULATORY_CARE_PROVIDER_SITE_OTHER): Payer: Self-pay | Admitting: Internal Medicine

## 2012-04-02 VITALS — BP 146/86 | HR 106 | Temp 97.8°F | Ht 75.0 in | Wt 211.0 lb

## 2012-04-02 DIAGNOSIS — K029 Dental caries, unspecified: Secondary | ICD-10-CM

## 2012-04-02 DIAGNOSIS — B20 Human immunodeficiency virus [HIV] disease: Secondary | ICD-10-CM

## 2012-04-02 NOTE — Assessment & Plan Note (Signed)
He continues to do well despite all his other medical issues. He continues to have excellent compliance. He will remain on 3 times a week Truvada with daily Sustiva. He does not use condoms with all sexual activity. He will return to clinic in 4 months. He was reminded to calm see [place] or internal medicine for any other acute issues.

## 2012-04-02 NOTE — Progress Notes (Signed)
  Subjective:    Patient ID: Brian [name], male    DOB: 1976-09-10, 36 y.o.   MRN: [id number]  HPI He comes in here for routine follow up of HIV. He did have his labs done on April 4 which show a continued undetectable viral load and CD4 count of 720. Since his labs he was hospitalized for hematuria secondary to supratherapeutic INR. He did see his primary physician yesterday and his repeat hemoglobin is improved. He otherwise endorsed his continued good compliance with his antiretroviral therapy which includes Sustiva daily and 3 times a week Truvada.  He is also followed by nephrology for stage IV chronic kidney disease from his underlying polycystic kidney disease. He does have a fistula that was placed in anticipation of dialysis about 7 weeks ago.   Review of Systems  Constitutional: Negative for fever, chills, fatigue and unexpected weight change.  HENT: Negative for sore throat and trouble swallowing.   Respiratory: Negative for cough, shortness of breath and wheezing.   Cardiovascular: Negative for chest pain, palpitations and leg swelling.  Gastrointestinal: Negative for nausea, abdominal pain and diarrhea.  Genitourinary: Negative for dysuria, urgency, decreased urine volume and difficulty urinating.  Musculoskeletal: Negative for myalgias and arthralgias.  Skin: Negative for rash.  Neurological: Negative for dizziness, weakness and headaches.  Hematological: Negative for adenopathy.  Psychiatric/Behavioral: Negative for dysphoric mood. The patient is not nervous/anxious.        Objective:   Physical Exam  Constitutional: He appears well-developed and well-nourished. No distress.  HENT:  Mouth/Throat: Oropharynx is clear and moist. No oropharyngeal exudate.  Cardiovascular: Normal rate, regular rhythm and normal heart sounds.  Exam reveals no gallop and no friction rub.   No murmur heard. Pulmonary/Chest: Effort normal and breath sounds normal. No respiratory distress. He  has no wheezes. He has no rales.  Abdominal: Soft. Bowel sounds are normal. He exhibits no distension. There is no tenderness. There is no rebound.  Lymphadenopathy:    He has no cervical adenopathy.  Skin: No rash noted.  Psychiatric: He has a normal mood and affect. His behavior is normal.          Assessment & Plan:

## 2012-04-02 NOTE — Assessment & Plan Note (Signed)
He is scheduled for our dental clinic.

## 2012-04-03 ENCOUNTER — Telehealth: Payer: Self-pay | Admitting: *Deleted

## 2012-04-03 NOTE — Telephone Encounter (Signed)
Pt came for an appointment concerned that his [employer] will no longer cover visits at [employer].  Pt is in the process of applying for Assistance.  Pt said that inially he was not charged with the [employer].  Then it was $10 and at the last visit he was asked for $ 90.  Pt is unemployed and does not have an income.  Pt was unable to be seen.  Office was called spoke to Maudie Mercury said that the [employer] is not valid at their office.  [facility]'s [employer] is.  Pt was thought to have had an [employer] from [employer].  Pt's [employer] is from [employer] and his [place] is invalid at [employer] according [employer].  Pt should continue his visits and when asked to pay the $ 90.00 should pay whatever he can.  Sander Nephew, RN 04/03/2012 10:45 AM.

## 2012-04-06 ENCOUNTER — Ambulatory Visit (INDEPENDENT_AMBULATORY_CARE_PROVIDER_SITE_OTHER): Payer: Self-pay | Admitting: Pharmacist

## 2012-04-06 DIAGNOSIS — I2699 Other pulmonary embolism without acute cor pulmonale: Secondary | ICD-10-CM

## 2012-04-06 DIAGNOSIS — Z86718 Personal history of other venous thrombosis and embolism: Secondary | ICD-10-CM

## 2012-04-06 DIAGNOSIS — Z7901 Long term (current) use of anticoagulants: Secondary | ICD-10-CM

## 2012-04-06 NOTE — Progress Notes (Signed)
Patient instructed to take medications as defined in the Anti-coagulation Track section of this encounter.  Patient instructed to OMIT today's dose.  Patient verbalized understanding of these instructions.    

## 2012-04-06 NOTE — Patient Instructions (Signed)
Patient instructed to take medications as defined in the Anti-coagulation Track section of this encounter.  Patient instructed to OMIT today's dose.  Patient verbalized understanding of these instructions.    

## 2012-04-10 ENCOUNTER — Ambulatory Visit (INDEPENDENT_AMBULATORY_CARE_PROVIDER_SITE_OTHER): Payer: Self-pay | Admitting: Ophthalmology

## 2012-04-10 ENCOUNTER — Other Ambulatory Visit: Payer: Self-pay | Admitting: Internal Medicine

## 2012-04-10 ENCOUNTER — Encounter: Payer: Self-pay | Admitting: Ophthalmology

## 2012-04-10 VITALS — BP 165/105 | HR 104 | Temp 97.0°F | Ht 75.0 in | Wt 214.6 lb

## 2012-04-10 DIAGNOSIS — R310 Gross hematuria: Secondary | ICD-10-CM

## 2012-04-10 DIAGNOSIS — R109 Unspecified abdominal pain: Secondary | ICD-10-CM

## 2012-04-10 DIAGNOSIS — N184 Chronic kidney disease, stage 4 (severe): Secondary | ICD-10-CM

## 2012-04-10 DIAGNOSIS — Q613 Polycystic kidney, unspecified: Secondary | ICD-10-CM

## 2012-04-10 LAB — URINALYSIS, ROUTINE W REFLEX MICROSCOPIC
Nitrite: NEGATIVE
Protein, ur: 300 mg/dL — AB
Specific Gravity, Urine: 1.013 (ref 1.005–1.030)
Urobilinogen, UA: 0.2 mg/dL (ref 0.0–1.0)

## 2012-04-10 LAB — URINALYSIS, MICROSCOPIC ONLY: Squamous Epithelial / LPF: NONE SEEN

## 2012-04-10 MED ORDER — OXYCODONE-ACETAMINOPHEN 10-325 MG PO TABS: 1.0000 | ORAL_TABLET | Freq: Four times a day (QID) | ORAL | Status: DC | PRN

## 2012-04-10 MED ORDER — AMLODIPINE BESYLATE 10 MG PO TABS: 10.0000 mg | ORAL_TABLET | Freq: Every day | ORAL | Status: DC

## 2012-04-10 NOTE — Patient Instructions (Signed)
Take 1 percocet every 6 hours as needed for pain. Will try to arrange appt with Dr. Junious Silk (urology).
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
## 2012-04-15 DIAGNOSIS — G8929 Other chronic pain: Secondary | ICD-10-CM | POA: Insufficient documentation

## 2012-04-15 NOTE — Assessment & Plan Note (Addendum)
Patient's pain is likely due to stretching of the renal capsule or compression of other internal structures due to extremely enlarged left kidney due to polycystic kidney disease. Patient may need surgical nephrectomy after he is able to be started on dialysis. It was thought that it was due to rupture of a renal cyst but it has been present for several days so this seems less likely at this point. Attempted to refer patient to urology but [facility] program is not accepting new referrals at this point. Patient already has referral with [facility] so we have asked him to contact them and arrange to be seen there. I prescribed him #30 of percocet.

## 2012-04-15 NOTE — Assessment & Plan Note (Signed)
Patient's UA and urinalysis were collected today, he continues to have hematuria with RBCs too numerous to count. His Hgb was 10.1 on 4/18. Please check CBC on next visit to ensure he is not bleeding to a significant extent. From his history his hematuria sounds improved.

## 2012-04-15 NOTE — Assessment & Plan Note (Addendum)
Patient had AV fistula placed 02/18/12 but has not matured as of yet. He reports it will be several more weeks until he will be a dialysis candidate. Patient's creatinine was last 4.3 On 4/12. We should measure it at his next visit. His creatinine was 3.0 one year ago and it was 3.5 at the beginning of this year and it increased during the month of March.

## 2012-04-20 ENCOUNTER — Ambulatory Visit: Payer: Self-pay

## 2012-04-21 ENCOUNTER — Other Ambulatory Visit: Payer: Self-pay | Admitting: *Deleted

## 2012-04-21 MED ORDER — OXYCODONE-ACETAMINOPHEN 10-325 MG PO TABS: 1.0000 | ORAL_TABLET | Freq: Four times a day (QID) | ORAL | Status: DC | PRN

## 2012-04-21 NOTE — Telephone Encounter (Signed)
I approved the prescription since he will not be seen until 1 week from now.

## 2012-04-21 NOTE — Telephone Encounter (Signed)
Will pick up written Rx   04/22/12 AM

## 2012-04-27 ENCOUNTER — Ambulatory Visit (INDEPENDENT_AMBULATORY_CARE_PROVIDER_SITE_OTHER): Payer: Self-pay | Admitting: Pharmacist

## 2012-04-27 ENCOUNTER — Ambulatory Visit (INDEPENDENT_AMBULATORY_CARE_PROVIDER_SITE_OTHER): Payer: Self-pay | Admitting: Ophthalmology

## 2012-04-27 ENCOUNTER — Encounter: Payer: Self-pay | Admitting: Ophthalmology

## 2012-04-27 VITALS — BP 144/88 | HR 89 | Temp 97.4°F | Ht 74.5 in | Wt 214.4 lb

## 2012-04-27 DIAGNOSIS — I2699 Other pulmonary embolism without acute cor pulmonale: Secondary | ICD-10-CM

## 2012-04-27 DIAGNOSIS — N186 End stage renal disease: Secondary | ICD-10-CM

## 2012-04-27 DIAGNOSIS — N184 Chronic kidney disease, stage 4 (severe): Secondary | ICD-10-CM

## 2012-04-27 DIAGNOSIS — R109 Unspecified abdominal pain: Secondary | ICD-10-CM

## 2012-04-27 DIAGNOSIS — Z86718 Personal history of other venous thrombosis and embolism: Secondary | ICD-10-CM

## 2012-04-27 DIAGNOSIS — Z7901 Long term (current) use of anticoagulants: Secondary | ICD-10-CM

## 2012-04-27 LAB — RENAL FUNCTION PANEL
BUN: 45 mg/dL — ABNORMAL HIGH (ref 6–23)
CO2: 21 mEq/L (ref 19–32)
Chloride: 104 mEq/L (ref 96–112)
Glucose, Bld: 81 mg/dL (ref 70–99)
Phosphorus: 4.2 mg/dL (ref 2.3–4.6)
Potassium: 4.9 mEq/L (ref 3.5–5.3)

## 2012-04-27 MED ORDER — OXYCODONE-ACETAMINOPHEN 10-325 MG PO TABS: 1.0000 | ORAL_TABLET | Freq: Four times a day (QID) | ORAL | Status: DC | PRN
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
## 2012-04-27 NOTE — Progress Notes (Signed)
Anti-Coagulation Progress Note  Brian [name] is a 36 y.o. male who is currently on an anti-coagulation regimen.    RECENT RESULTS: Recent results are below, the most recent result is correlated with a dose of 57.5 mg. per week: Lab Results  Component Value Date   INR 4.00 04/27/2012   INR 3.70 04/06/2012   INR 1.90 03/30/2012    ANTI-COAG DOSE:   Latest dosing instructions   Total Sun Mon Tue Wed Thu Fri Sat   55 7.5 mg 7.5 mg 7.5 mg 10 mg 7.5 mg 7.5 mg 7.5 mg    (5 mg1.5) (5 mg1.5) (5 mg1.5) (5 mg2) (5 mg1.5) (5 mg1.5) (5 mg1.5)         ANTICOAG SUMMARY: Anticoagulation Episode Summary              Current INR goal 2.0-3.0 Next INR check 05/18/2012   INR from last check 4.00! (04/27/2012)     Weekly max dose (mg)  Target end date Indefinite   Indications Long term current use of anticoagulant, PULMONARY EMBOLISM, History of DVT (deep vein thrombosis)   [facility]    Send INR reminders to ANTICOAG [facility]   Comments Has had two episodes of VTE (1 index VTE and 1 subsequent bilateral PE). Indefinite therapy suggested.       Provider Role Specialty Phone number   Bartholomew Crews, MD  Internal Medicine [phone]        ANTICOAG TODAY: Anticoagulation Summary as of 04/27/2012              INR goal 2.0-3.0     Selected INR 4.00! (04/27/2012) Next INR check 05/18/2012   Weekly max dose (mg)  Target end date Indefinite   Indications Long term current use of anticoagulant, PULMONARY EMBOLISM, History of DVT (deep vein thrombosis)    Anticoagulation Episode Summary              [facility]erred [facility]    Send INR reminders to ANTICOAG IMP   Comments Has had two episodes of VTE (1 index VTE and 1 subsequent bilateral PE). Indefinite therapy suggested.       Provider Role Specialty Phone number   Bartholomew Crews, MD  Internal Medicine [phone]        PATIENT INSTRUCTIONS: Patient Instructions    Patient instructed to take medications as defined in the Anti-coagulation Track section of this encounter.  Patient instructed to take today's dose.  Patient verbalized understanding of these instructions.        FOLLOW-UP Return in 3 weeks (on 05/18/2012) for Follow up INR.  Jorene Guest, III Pharm.D., CACP

## 2012-04-27 NOTE — Assessment & Plan Note (Addendum)
Patient had stopped sodium bicarb and lasix he was put on hospital discharge. He is not complaining of shortness of breath or having worsened edema and his potassium remains normal so I will allow him to stay off of these medications until he is seen by Dr. Clover Mealy. His creatinine is slightly improved at 4.67 from our last reading 3 weeks ago at 5.31. He will see [facility] in 1 week. Will print lab results and progress notes and send to their office.

## 2012-04-27 NOTE — Patient Instructions (Signed)
Patient instructed to take medications as defined in the Anti-coagulation Track section of this encounter.  Patient instructed to take today's dose.  Patient verbalized understanding of these instructions.    

## 2012-04-28 LAB — CBC
HCT: 34.2 % — ABNORMAL LOW (ref 39.0–52.0)
Hemoglobin: 11.1 g/dL — ABNORMAL LOW (ref 13.0–17.0)
MCHC: 32.5 g/dL (ref 30.0–36.0)
MCV: 93.2 fL (ref 78.0–100.0)

## 2012-04-28 NOTE — Assessment & Plan Note (Signed)
Patient's pain is reasonably well controlled on 10mg  of percocet 2-3 times a day. He will likely need nephrectomy in the future since his kidneys are massively enlarged and stretching the renal capsule.

## 2012-04-28 NOTE — Assessment & Plan Note (Signed)
Patient had stopped sodium bicarb and lasix he was put on hospital discharge. He is not complaining of shortness of breath or having worsened edema and his potassium remains normal so I will allow him to stay off of these medications until he is seen by Dr. Clover Mealy. His creatinine is slightly improved at 4.67 from our last reading 3 weeks ago at 5.31. He will see [facility] in 1 week. Will print lab results and progress notes and send to their office.

## 2012-05-04 NOTE — Progress Notes (Signed)
Addended by: Hulan Fray on: 05/04/2012 04:27 PM   Modules accepted: Orders

## 2012-05-13 ENCOUNTER — Other Ambulatory Visit: Payer: Self-pay | Admitting: *Deleted

## 2012-05-13 NOTE — Telephone Encounter (Signed)
Dr. Linus Salmons last saw him at [place] [facility]. We do not prescribe his HIV medications.

## 2012-05-14 ENCOUNTER — Other Ambulatory Visit: Payer: Self-pay | Admitting: *Deleted

## 2012-05-14 DIAGNOSIS — Z3A42 42 weeks gestation of pregnancy: Secondary | ICD-10-CM

## 2012-05-14 DIAGNOSIS — B20 Human immunodeficiency virus [HIV] disease: Secondary | ICD-10-CM

## 2012-05-14 MED ORDER — EFAVIRENZ 600 MG PO TABS: 600.0000 mg | ORAL_TABLET | Freq: Every day | ORAL | Status: DC

## 2012-05-14 NOTE — Telephone Encounter (Signed)
Pharmacy called and needed this Rx approved. Checked patient med list and ok'd the refills per provider last note.

## 2012-05-14 NOTE — Telephone Encounter (Signed)
Pharmacy aware to send to [facility] per Dr Marcello Moores.

## 2012-05-18 ENCOUNTER — Ambulatory Visit (INDEPENDENT_AMBULATORY_CARE_PROVIDER_SITE_OTHER): Payer: Medicaid Other | Admitting: Pharmacist

## 2012-05-18 DIAGNOSIS — I2699 Other pulmonary embolism without acute cor pulmonale: Secondary | ICD-10-CM

## 2012-05-18 DIAGNOSIS — Z7901 Long term (current) use of anticoagulants: Secondary | ICD-10-CM

## 2012-05-18 DIAGNOSIS — Z86718 Personal history of other venous thrombosis and embolism: Secondary | ICD-10-CM

## 2012-05-18 LAB — POCT INR: INR: 2

## 2012-05-18 NOTE — Patient Instructions (Signed)
Patient instructed to take medications as defined in the Anti-coagulation Track section of this encounter.  Patient instructed to take today's dose.  Patient verbalized understanding of these instructions.    

## 2012-05-18 NOTE — Progress Notes (Signed)
Anti-Coagulation Progress Note  Brian [name] is a 36 y.o. male who is currently on an anti-coagulation regimen.    RECENT RESULTS: Recent results are below, the most recent result is correlated with a dose of 55 mg. per week: Lab Results  Component Value Date   INR 2.0 05/18/2012   INR 4.00 04/27/2012   INR 3.70 04/06/2012    ANTI-COAG DOSE:   Latest dosing instructions   Total Sun Mon Tue Wed Thu Fri Sat   57.5 7.5 mg 10 mg 7.5 mg 7.5 mg 10 mg 7.5 mg 7.5 mg    (5 mg1.5) (5 mg2) (5 mg1.5) (5 mg1.5) (5 mg2) (5 mg1.5) (5 mg1.5)         ANTICOAG SUMMARY: Anticoagulation Episode Summary              Current INR goal 2.0-3.0 Next INR check 06/08/2012   INR from last check 2.0 (05/18/2012)     Weekly max dose (mg)  Target end date Indefinite   Indications Long term current use of anticoagulant, PULMONARY EMBOLISM, History of DVT (deep vein thrombosis)   [facility]    Send INR reminders to [facility]   Comments Has had two episodes of VTE (1 index VTE and 1 subsequent bilateral PE). Indefinite therapy suggested.       Provider Role Specialty Phone number   Bartholomew Crews, MD  Internal Medicine [phone]        ANTICOAG TODAY: Anticoagulation Summary as of 05/18/2012              INR goal 2.0-3.0     Selected INR 2.0 (05/18/2012) Next INR check 06/08/2012   Weekly max dose (mg)  Target end date Indefinite   Indications Long term current use of anticoagulant, PULMONARY EMBOLISM, History of DVT (deep vein thrombosis)    Anticoagulation Episode Summary              IN[facility]    Send INR reminders to [facility]   Comments Has had two episodes of VTE (1 index VTE and 1 subsequent bilateral PE). Indefinite therapy suggested.       Provider Role Specialty Phone number   Bartholomew Crews, MD  Internal Medicine [phone]        PATIENT INSTRUCTIONS: Patient Instructions  Patient  instructed to take medications as defined in the Anti-coagulation Track section of this encounter.  Patient instructed to take today's dose.  Patient verbalized understanding of these instructions.        FOLLOW-UP Return in 3 weeks (on 06/08/2012) for Follow up INR at 2:45PM.  Jorene Guest, III Pharm.D., CACP

## 2012-06-05 ENCOUNTER — Encounter: Payer: Self-pay | Admitting: Ophthalmology

## 2012-06-05 ENCOUNTER — Other Ambulatory Visit: Payer: Self-pay | Admitting: *Deleted

## 2012-06-05 MED ORDER — OXYCODONE-ACETAMINOPHEN 10-325 MG PO TABS: 1.0000 | ORAL_TABLET | Freq: Four times a day (QID) | ORAL | Status: DC | PRN

## 2012-06-05 NOTE — Telephone Encounter (Signed)
Wants to pick up written Rx 06/08/12.

## 2012-06-08 ENCOUNTER — Ambulatory Visit (INDEPENDENT_AMBULATORY_CARE_PROVIDER_SITE_OTHER): Payer: Medicaid Other | Admitting: Pharmacist

## 2012-06-08 ENCOUNTER — Telehealth: Payer: Self-pay | Admitting: *Deleted

## 2012-06-08 ENCOUNTER — Other Ambulatory Visit: Payer: Self-pay | Admitting: Internal Medicine

## 2012-06-08 ENCOUNTER — Other Ambulatory Visit: Payer: Medicaid Other

## 2012-06-08 DIAGNOSIS — G8929 Other chronic pain: Secondary | ICD-10-CM

## 2012-06-08 DIAGNOSIS — I2699 Other pulmonary embolism without acute cor pulmonale: Secondary | ICD-10-CM

## 2012-06-08 DIAGNOSIS — Z7901 Long term (current) use of anticoagulants: Secondary | ICD-10-CM

## 2012-06-08 DIAGNOSIS — Z86718 Personal history of other venous thrombosis and embolism: Secondary | ICD-10-CM

## 2012-06-08 MED ORDER — OXYCODONE-ACETAMINOPHEN 10-325 MG PO TABS: 1.0000 | ORAL_TABLET | Freq: Four times a day (QID) | ORAL | Status: DC | PRN

## 2012-06-08 NOTE — Patient Instructions (Signed)
Patient instructed to take medications as defined in the Anti-coagulation Track section of this encounter.  Patient instructed to take today's dose.  Patient verbalized understanding of these instructions.    

## 2012-06-08 NOTE — Telephone Encounter (Signed)
Pt here to pick-up Percocet rx.  UDS obtained. Stated last dose was taken 6/19.

## 2012-06-08 NOTE — Progress Notes (Signed)
Dr Marcello Moores authorized oxycodone refill but Rx was never handed off to RN. Many red flags listed in FYI - inappropriate UDS's , THC use, no shows. Ran Index database and OK. Last refill was 5/13 - was for 90 day supply. Will ask triage to inquire as to last dose, obtain UDS, gave refill for #90 days. New PCP will need to address this issue.

## 2012-06-08 NOTE — Telephone Encounter (Signed)
Thank you. Since last dose was the 19th will be negative for opioids. Has had THC in past. We need to get a UDS in future when he is on it. Cont to monitor closely due to red flags in the past.

## 2012-06-08 NOTE — Progress Notes (Signed)
Anti-Coagulation Progress Note  Brian [name] is a 36 y.o. male who is currently on an anti-coagulation regimen.    RECENT RESULTS: Recent results are below, the most recent result is correlated with a dose of 57.5 mg. per week: Lab Results  Component Value Date   INR 2.40 06/08/2012   INR 2.0 05/18/2012   INR 4.00 04/27/2012    ANTI-COAG DOSE:   Latest dosing instructions   Total Sun Mon Tue Wed Thu Fri Sat   57.5 7.5 mg 10 mg 7.5 mg 7.5 mg 10 mg 7.5 mg 7.5 mg    (5 mg1.5) (5 mg2) (5 mg1.5) (5 mg1.5) (5 mg2) (5 mg1.5) (5 mg1.5)         ANTICOAG SUMMARY: Anticoagulation Episode Summary              Current INR goal 2.0-3.0 Next INR check 07/06/2012   INR from last check 2.40 (06/08/2012)     Weekly max dose (mg)  Target end date Indefinite   Indications Long term current use of anticoagulant, PULMONARY EMBOLISM, History of DVT (deep vein thrombosis)   [facility] location [facility] Preferred [facility]    Send INR reminders to ANTICOAG [facility]   Comments Has had two episodes of VTE (1 index VTE and 1 subsequent bilateral PE). Indefinite therapy suggested.       Provider Role Specialty Phone number   Bartholomew Crews, MD  Internal Medicine [phone]        ANTICOAG TODAY: Anticoagulation Summary as of 06/08/2012              INR goal 2.0-3.0     Selected INR 2.40 (06/08/2012) Next INR check 07/06/2012   Weekly max dose (mg)  Target end date Indefinite   Indications Long term current use of anticoagulant, PULMONARY EMBOLISM, History of DVT (deep vein thrombosis)    Anticoagulation Episode Summary              [facility]erred [facility]    Send INR reminders to ANTICOAG [facility]   Comments Has had two episodes of VTE (1 index VTE and 1 subsequent bilateral PE). Indefinite therapy suggested.       Provider Role Specialty Phone number   Bartholomew Crews, MD  Internal Medicine [phone]        PATIENT INSTRUCTIONS: Patient Instructions    Patient instructed to take medications as defined in the Anti-coagulation Track section of this encounter.  Patient instructed to take today's dose.  Patient verbalized understanding of these instructions.        FOLLOW-UP Return in 4 weeks (on 07/06/2012) for Follow up INR at 2;30PM.  Jorene Guest, III Pharm.D., CACP

## 2012-06-09 LAB — PRESCRIPTION ABUSE MONITORING 15P, URINE
Barbiturate Screen, Urine: NEGATIVE ng/mL
Benzodiazepine Screen, Urine: NEGATIVE ng/mL
Cannabinoid Scrn, Ur: POSITIVE ng/mL — ABNORMAL HIGH
Carisoprodol, Urine: NEGATIVE ng/mL
Fentanyl, Ur: NEGATIVE ng/mL
Meperidine, Ur: NEGATIVE ng/mL
Opiate Screen, Urine: NEGATIVE ng/mL
Tramadol Scrn, Ur: NEGATIVE ng/mL
Zolpidem, Urine: NEGATIVE ng/mL

## 2012-06-10 ENCOUNTER — Encounter: Payer: Self-pay | Admitting: Internal Medicine

## 2012-06-10 LAB — CANNABANOIDS (GC/LC/MS), URINE: THC-COOH (GC/LC/MS), ur confirm: 39 ng/mL

## 2012-06-10 NOTE — Progress Notes (Signed)
UDS again + THC. Negative for opioids bc out of for several days. Several red flags non of which appear to have neem addressed with the pt. This is not an appropriate conversation to have over phone. Has Dr Elie Confer appt on the 22nd so will ask that we sch an appt with him in [facility] on the 22nd to address.

## 2012-06-23 ENCOUNTER — Other Ambulatory Visit: Payer: Self-pay | Admitting: Ophthalmology

## 2012-07-06 ENCOUNTER — Ambulatory Visit (INDEPENDENT_AMBULATORY_CARE_PROVIDER_SITE_OTHER): Payer: Medicaid Other | Admitting: Internal Medicine

## 2012-07-06 ENCOUNTER — Encounter: Payer: Self-pay | Admitting: Internal Medicine

## 2012-07-06 ENCOUNTER — Ambulatory Visit (INDEPENDENT_AMBULATORY_CARE_PROVIDER_SITE_OTHER): Payer: Medicaid Other | Admitting: Pharmacist

## 2012-07-06 VITALS — BP 143/86 | HR 102 | Temp 97.1°F | Ht 74.0 in | Wt 208.5 lb

## 2012-07-06 DIAGNOSIS — Z7901 Long term (current) use of anticoagulants: Secondary | ICD-10-CM

## 2012-07-06 DIAGNOSIS — I2699 Other pulmonary embolism without acute cor pulmonale: Secondary | ICD-10-CM

## 2012-07-06 DIAGNOSIS — R21 Rash and other nonspecific skin eruption: Secondary | ICD-10-CM

## 2012-07-06 DIAGNOSIS — Q613 Polycystic kidney, unspecified: Secondary | ICD-10-CM

## 2012-07-06 DIAGNOSIS — Z86718 Personal history of other venous thrombosis and embolism: Secondary | ICD-10-CM

## 2012-07-06 DIAGNOSIS — I10 Essential (primary) hypertension: Secondary | ICD-10-CM

## 2012-07-06 MED ORDER — OXYCODONE-ACETAMINOPHEN 10-325 MG PO TABS: 1.0000 | ORAL_TABLET | Freq: Four times a day (QID) | ORAL | Status: DC | PRN

## 2012-07-06 NOTE — Assessment & Plan Note (Signed)
Patient is currently taking Coumadin. His INR was monitored by Dr. Elie Confer. His recent INR was 4.7 today. He does not have any signs of recurrent PE or DVT. We'll followup.

## 2012-07-06 NOTE — Patient Instructions (Signed)
Patient instructed to take medications as defined in the Anti-coagulation Track section of this encounter.  Patient instructed to OMIT today's dose.  Patient verbalized understanding of these instructions.    

## 2012-07-06 NOTE — Progress Notes (Signed)
Anti-Coagulation Progress Note  Brian [name] is a 36 y.o. male who is currently on an anti-coagulation regimen.    RECENT RESULTS: Recent results are below, the most recent result is correlated with a dose of 57.5 mg. per week: Lab Results  Component Value Date   INR 4.70 07/06/2012   INR 2.40 06/08/2012   INR 2.0 05/18/2012    ANTI-COAG DOSE:   Latest dosing instructions   Total Sun Mon Tue Wed Thu Fri Sat   50 7.5 mg 7.5 mg 7.5 mg 5 mg 7.5 mg 7.5 mg 7.5 mg    (5 mg1.5) (5 mg1.5) (5 mg1.5) (5 mg1) (5 mg1.5) (5 mg1.5) (5 mg1.5)         ANTICOAG SUMMARY: Anticoagulation Episode Summary              Current INR goal 2.0-3.0 Next INR check 07/20/2012   INR from last check 4.70! 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 Indefinite therapy suggested.       Provider Role Specialty Phone number   Bartholomew Crews, MD  Internal Medicine [phone]        ANTICOAG TODAY: Anticoagulation Summary as of 07/06/2012              INR goal 2.0-3.0     Selected INR 4.70! (07/06/2012) Next INR check 07/20/2012   Weekly max dose (mg)  Target end date Indefinite   Indications Long term current use of anticoagulant, PULMONARY EMBOLISM, History of DVT (deep vein thrombosis)    Anticoagulation Episode Summary              [facility]    Send INR reminders to [facility]AG [facility]   Comments Has had two episodes of VTE (1 index VTE and 1 subsequent bilateral PE). Indefinite therapy suggested.       Provider Role Specialty Phone number   Bartholomew Crews, MD  Internal Medicine [phone]        PATIENT INSTRUCTIONS: Patient Instructions    Patient instructed to take medications as defined in the Anti-coagulation Track section of this encounter.  Patient instructed to OMIT today's dose.  Patient verbalized understanding of these instructions.        FOLLOW-UP Return in 2 weeks (on 07/20/2012) for Follow up INR at 2:15PM.  Jorene Guest, III Pharm.D., CACP

## 2012-07-06 NOTE — Assessment & Plan Note (Signed)
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 He has been followed by in [facility]. His kidney function has been progressively getting worse. He already had fistula placed in the right arm. He has an appointment on 07/16/12 in [facility]. Today he reports having severe pain in right flank area. His pain was controlled well with Percocet in the past, however patient violated pain contract. Most recent UDS showed THC positive on 5/13. He explained it that it was because he had poor appetite and tried marijuana for his appetite. I discussed with Dr. Murlean Caller. Since patient has organic disease, we decided to give patient a chance and let him sign an another pain contract. He was educated about the consequences for further violating pain contract. He understood and agreed to follow the rule. Patient was given a prescription of Percocet 90 pills per month.

## 2012-07-06 NOTE — Patient Instructions (Signed)
1. Please take all medications as prescribed.  3. If you have worsening of your symptoms or new symptoms arise, please call the clinic [phone]), or go to the ER immediately if symptoms are severe.

## 2012-07-06 NOTE — Assessment & Plan Note (Addendum)
Patient's skin rash looks like a locally bruise although he denies any injury. There is no signs of infection. Patient doesn't have any sensation change over the rash. Patient has HIV, however the current rash doesn't look like a typical lesion for Kaposi sarcoma.   -Will observe for one month. If not resolve, will consider to do biopsy to rule out Kaposi sarcoma or give referral to dermatology for further evaluation.
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
## 2012-07-06 NOTE — Assessment & Plan Note (Signed)
Patient blood pressure is 143/86. Slight elevation of Bp may be due to his flank pain. Will continue current regimen and followup.

## 2012-07-07 LAB — RENAL FUNCTION PANEL
BUN: 48 mg/dL — ABNORMAL HIGH (ref 6–23)
CO2: 23 mEq/L (ref 19–32)
Chloride: 102 mEq/L (ref 96–112)
Creat: 5.08 mg/dL — ABNORMAL HIGH (ref 0.50–1.35)

## 2012-07-07 LAB — FERRITIN: Ferritin: 34 ng/mL (ref 22–322)

## 2012-07-07 LAB — IRON AND TIBC
%SAT: 10 % — ABNORMAL LOW (ref 20–55)
TIBC: 343 ug/dL (ref 215–435)

## 2012-07-07 LAB — PARATHYROID HORMONE, INTACT (NO CA): PTH: 262.9 pg/mL — ABNORMAL HIGH (ref 14.0–72.0)

## 2012-07-07 NOTE — Progress Notes (Signed)
Agree with plan 

## 2012-07-07 NOTE — Progress Notes (Signed)
Results of blood work ordered by Dr. Shirl Harris of [facility] faxed to [phone], Attention: Jan on 07-07-12 by [facility], at 1443pm.

## 2012-07-20 ENCOUNTER — Ambulatory Visit (INDEPENDENT_AMBULATORY_CARE_PROVIDER_SITE_OTHER): Payer: Medicaid Other | Admitting: Pharmacist

## 2012-07-20 DIAGNOSIS — Z86718 Personal history of other venous thrombosis and embolism: Secondary | ICD-10-CM

## 2012-07-20 DIAGNOSIS — I2699 Other pulmonary embolism without acute cor pulmonale: Secondary | ICD-10-CM

## 2012-07-20 DIAGNOSIS — Z7901 Long term (current) use of anticoagulants: Secondary | ICD-10-CM

## 2012-07-20 NOTE — Patient Instructions (Signed)
Patient instructed to take medications as defined in the Anti-coagulation Track section of this encounter.  Patient instructed to take today's dose.  Patient verbalized understanding of these instructions.    

## 2012-07-20 NOTE — Progress Notes (Signed)
Anti-Coagulation Progress Note  Brian [name] is a 36 y.o. male who is currently on an anti-coagulation regimen.    RECENT RESULTS: Recent results are below, the most recent result is correlated with a dose of 50 mg. per week: Lab Results  Component Value Date   INR 3.00 07/20/2012   INR 4.70 07/06/2012   INR 2.40 06/08/2012    ANTI-COAG DOSE:   Latest dosing instructions   Total Sun Mon Tue Wed Thu Fri Sat   47.5 7.5 mg 5 mg 7.5 mg 7.5 mg 5 mg 7.5 mg 7.5 mg    (5 mg1.5) (5 mg1) (5 mg1.5) (5 mg1.5) (5 mg1) (5 mg1.5) (5 mg1.5)         ANTICOAG SUMMARY: Anticoagulation Episode Summary              Current INR goal 2.0-3.0 Next INR check 08/10/2012   INR from last check 3.00 (07/20/2012)     Weekly max dose (mg)  Target end date Indefinite   Indications Long term current use of anticoagulant, PULMONARY EMBOLISM, History of DVT (deep vein thrombosis)   [facility]    Send INR reminders to ANTICOAG [facility]   Comments Has had two episodes of VTE (1 index VTE and 1 subsequent bilateral PE). Indefinite therapy suggested.       Provider Role Specialty Phone number   Bartholomew Crews, MD  Internal Medicine [phone]        ANTICOAG TODAY: Anticoagulation Summary as of 07/20/2012              INR goal 2.0-3.0     Selected INR 3.00 (07/20/2012) Next INR check 08/10/2012   Weekly max dose (mg)  Target end date Indefinite   Indications Long term current use of anticoagulant, PULMONARY EMBOLISM, History of DVT (deep vein thrombosis)    Anticoagulation Episode Summary              [facility]erred [facility]    Send INR reminders to ANTICOAG IMP   Comments Has had two episodes of VTE (1 index VTE and 1 subsequent bilateral PE). Indefinite therapy suggested.       Provider Role Specialty Phone number   Bartholomew Crews, MD  Internal Medicine [phone]        PATIENT INSTRUCTIONS: Patient Instructions  Patient  instructed to take medications as defined in the Anti-coagulation Track section of this encounter.  Patient instructed to take today's dose.  Patient verbalized understanding of these instructions.        FOLLOW-UP Return in 3 weeks (on 08/10/2012) for Follow up INR at 2:45PM.  Jorene Guest, III Pharm.D., CACP

## 2012-07-21 ENCOUNTER — Other Ambulatory Visit (INDEPENDENT_AMBULATORY_CARE_PROVIDER_SITE_OTHER): Payer: Medicaid Other

## 2012-07-21 DIAGNOSIS — B20 Human immunodeficiency virus [HIV] disease: Secondary | ICD-10-CM

## 2012-07-21 LAB — COMPREHENSIVE METABOLIC PANEL
Albumin: 3.6 g/dL (ref 3.5–5.2)
Alkaline Phosphatase: 74 U/L (ref 39–117)
BUN: 47 mg/dL — ABNORMAL HIGH (ref 6–23)
CO2: 18 mEq/L — ABNORMAL LOW (ref 19–32)
Glucose, Bld: 92 mg/dL (ref 70–99)
Potassium: 5.1 mEq/L (ref 3.5–5.3)
Total Bilirubin: 0.3 mg/dL (ref 0.3–1.2)
Total Protein: 7.4 g/dL (ref 6.0–8.3)

## 2012-07-21 LAB — CBC WITH DIFFERENTIAL/PLATELET
Eosinophils Absolute: 0.1 10*3/uL (ref 0.0–0.7)
Hemoglobin: 11.3 g/dL — ABNORMAL LOW (ref 13.0–17.0)
Lymphocytes Relative: 23 % (ref 12–46)
Lymphs Abs: 1.8 10*3/uL (ref 0.7–4.0)
MCH: 29.1 pg (ref 26.0–34.0)
Monocytes Relative: 9 % (ref 3–12)
Neutrophils Relative %: 65 % (ref 43–77)
Platelets: 320 10*3/uL (ref 150–400)
RBC: 3.88 MIL/uL — ABNORMAL LOW (ref 4.22–5.81)
WBC: 7.7 10*3/uL (ref 4.0–10.5)

## 2012-07-22 LAB — HIV-1 RNA QUANT-NO REFLEX-BLD
HIV 1 RNA Quant: <20 copies/mL (ref <20)
HIV-1 RNA Quant, Log: <1.3 {Log} (ref <1.30)

## 2012-07-23 LAB — T-HELPER CELL (CD4) - (RCID CLINIC ONLY)
CD4 % Helper T Cell: 44 % (ref 33–55)
CD4 T Cell Abs: 840 uL (ref 400–2700)

## 2012-08-04 ENCOUNTER — Ambulatory Visit: Payer: Self-pay | Admitting: Internal Medicine

## 2012-08-07 ENCOUNTER — Encounter: Payer: Medicaid Other | Admitting: Internal Medicine

## 2012-08-10 ENCOUNTER — Ambulatory Visit: Payer: Medicaid Other

## 2012-08-14 ENCOUNTER — Encounter: Payer: Self-pay | Admitting: Internal Medicine

## 2012-08-14 ENCOUNTER — Ambulatory Visit (INDEPENDENT_AMBULATORY_CARE_PROVIDER_SITE_OTHER): Payer: Medicaid Other | Admitting: Internal Medicine

## 2012-08-14 VITALS — BP 154/92 | HR 104 | Temp 97.3°F | Ht 76.0 in | Wt 207.8 lb

## 2012-08-14 DIAGNOSIS — Q613 Polycystic kidney, unspecified: Secondary | ICD-10-CM

## 2012-08-14 DIAGNOSIS — I129 Hypertensive chronic kidney disease with stage 1 through stage 4 chronic kidney disease, or unspecified chronic kidney disease: Secondary | ICD-10-CM

## 2012-08-14 DIAGNOSIS — N184 Chronic kidney disease, stage 4 (severe): Secondary | ICD-10-CM

## 2012-08-14 MED ORDER — MIRTAZAPINE 15 MG PO TABS: 15.0000 mg | ORAL_TABLET | Freq: Every day | ORAL | Status: DC

## 2012-08-14 MED ORDER — OXYCODONE-ACETAMINOPHEN 10-325 MG PO TABS: 1.0000 | ORAL_TABLET | Freq: Four times a day (QID) | ORAL | Status: DC | PRN
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
## 2012-08-15 LAB — PRESCRIPTION ABUSE MONITORING 15P, URINE
Amphetamine/Meth: NEGATIVE ng/mL
Barbiturate Screen, Urine: NEGATIVE ng/mL
Benzodiazepine Screen, Urine: NEGATIVE ng/mL
Buprenorphine, Urine: NEGATIVE ng/mL
Carisoprodol, Urine: NEGATIVE ng/mL
Fentanyl, Ur: NEGATIVE ng/mL
Meperidine, Ur: NEGATIVE ng/mL
Tramadol Scrn, Ur: NEGATIVE ng/mL

## 2012-08-19 LAB — OPIATES/OPIOIDS (LC/MS-MS)
Heroin (6-AM), UR: NEGATIVE ng/mL
Hydrocodone: NEGATIVE ng/mL
Oxycodone, ur: 164 ng/mL
Oxymorphone: 258 ng/mL

## 2012-08-20 ENCOUNTER — Ambulatory Visit (INDEPENDENT_AMBULATORY_CARE_PROVIDER_SITE_OTHER): Payer: Medicaid Other | Admitting: Internal Medicine

## 2012-08-20 ENCOUNTER — Encounter: Payer: Self-pay | Admitting: Internal Medicine

## 2012-08-20 VITALS — BP 134/78 | HR 103 | Temp 97.7°F | Ht 75.0 in | Wt 205.0 lb

## 2012-08-20 DIAGNOSIS — B20 Human immunodeficiency virus [HIV] disease: Secondary | ICD-10-CM

## 2012-08-20 DIAGNOSIS — Z21 Asymptomatic human immunodeficiency virus [HIV] infection status: Secondary | ICD-10-CM

## 2012-08-20 MED ORDER — LAMIVUDINE 100 MG PO TABS: 100.0000 mg | ORAL_TABLET | Freq: Every day | ORAL | Status: DC

## 2012-08-20 MED ORDER — ZIDOVUDINE 300 MG PO TABS: 300.0000 mg | ORAL_TABLET | Freq: Two times a day (BID) | ORAL | Status: DC

## 2012-08-20 MED ORDER — EFAVIRENZ 600 MG PO TABS: 600.0000 mg | ORAL_TABLET | Freq: Every day | ORAL | Status: DC

## 2012-08-20 NOTE — Assessment & Plan Note (Addendum)
He continues to take his medicine well but I am concerned with his continued poor renal function. I did discuss this with the patient and have decided to change his regimen. He will continue with Sustiva and I will change him to lamivudine which will be 150 mg x1 day and then 100 mg a day along with zidovudine at 300 mg twice a day. If he does get on dialysis, I will reduce his zidovudine 300 mg a day in his community and will need to be changed as well  He will return in 6 weeks for labs and a followup visit to assure good compliance and to ensure his hemoglobin is stable her

## 2012-08-20 NOTE — Progress Notes (Signed)
  Subjective:    Patient ID: NHIA HEYMANN, male    DOB: 31-Aug-1976, 36 y.o.   MRN: [id number]  HPI He comes in for routine follow up of his HIV. He is continued on Sustiva and Truvada every Monday Wednesday and Friday. He has good compliance and has had no new issues with his medications. He is being evaluated for nephrectomy for his polycystic kidney disease and possibility of dialysis though he tells me he is somewhat resistant. He does have a fistula that has been placed.   Review of Systems  Constitutional: Negative for fever and fatigue.  HENT: Negative for sore throat and trouble swallowing.   Respiratory: Negative for cough and shortness of breath.   Cardiovascular: Negative for leg swelling.  Gastrointestinal: Negative for nausea, abdominal pain and diarrhea.  Genitourinary: Positive for hematuria and flank pain.       Pain from his cysts  Musculoskeletal: Negative for myalgias and arthralgias.  Skin: Negative for rash.  Neurological: Negative for dizziness and headaches.       Objective:   Physical Exam  Constitutional: He appears well-developed and well-nourished. No distress.  HENT:  Mouth/Throat: Oropharynx is clear and moist. No oropharyngeal exudate.  Cardiovascular: Normal rate, regular rhythm and normal heart sounds.  Exam reveals no gallop and no friction rub.   No murmur heard. Pulmonary/Chest: Effort normal and breath sounds normal. No respiratory distress. He has no wheezes. He has no rales.  Abdominal: Soft. Bowel sounds are normal. He exhibits no distension. There is no tenderness. There is no rebound.  Lymphadenopathy:    He has no cervical adenopathy.          Assessment & Plan:

## 2012-08-24 ENCOUNTER — Ambulatory Visit (INDEPENDENT_AMBULATORY_CARE_PROVIDER_SITE_OTHER): Payer: Medicaid Other | Admitting: Pharmacist

## 2012-08-24 DIAGNOSIS — Z7901 Long term (current) use of anticoagulants: Secondary | ICD-10-CM

## 2012-08-24 DIAGNOSIS — Z86718 Personal history of other venous thrombosis and embolism: Secondary | ICD-10-CM

## 2012-08-24 DIAGNOSIS — I2699 Other pulmonary embolism without acute cor pulmonale: Secondary | ICD-10-CM

## 2012-08-24 LAB — POCT INR: INR: 5.2

## 2012-08-24 NOTE — Patient Instructions (Signed)
Patient instructed to take medications as defined in the Anti-coagulation Track section of this encounter.  Patient instructed to OMIT today's dose.  Patient verbalized understanding of these instructions.    

## 2012-08-24 NOTE — Progress Notes (Signed)
Anti-Coagulation Progress Note  [name] is a 36 y.o. male who is currently on an anti-coagulation regimen.    RECENT RESULTS: Recent results are below, the most recent result is correlated with a dose of 47.5 mg. per week: Lab Results  Component Value Date   INR 5.20 08/24/2012   INR 3.00 07/20/2012   INR 4.70 07/06/2012    ANTI-COAG DOSE:   Latest dosing instructions   Total Sun Mon Tue Wed Thu Fri Sat   42.5 7.5 mg 5 mg 7.5 mg 5 mg 5 mg 7.5 mg 5 mg    (5 mg1.5) (5 mg1) (5 mg1.5) (5 mg1) (5 mg1) (5 mg1.5) (5 mg1)         ANTICOAG SUMMARY: Anticoagulation Episode Summary              Current INR goal 2.0-3.0 Next INR check 09/07/2012   INR from last check 5.20! (08/24/2012)     Weekly max dose (mg)  Target end date Indefinite   Indications History of DVT (deep vein thrombosis) (Resolved), Encounter for long-term (current) use of anticoagulants   INR check location [facility] lab    Send INR reminders to    Comments             ANTICOAG TODAY: Anticoagulation Summary as of 08/24/2012              INR goal 2.0-3.0     Selected INR 5.20! (08/24/2012) Next INR check 09/07/2012   Weekly max dose (mg)  Target end date Indefinite   Indications History of DVT (deep vein thrombosis) (Resolved), Encounter for long-term (current) use of anticoagulants    Anticoagulation Episode Summary              INR check location [facility]erred lab    Send INR reminders to    Comments             PATIENT INSTRUCTIONS: Patient Instructions  Patient instructed to take medications as defined in the Anti-coagulation Track section of this encounter.  Patient instructed to OMIT today's dose.  Patient verbalized understanding of these instructions.        FOLLOW-UP Return in 2 weeks (on 09/07/2012) for Follow up INR at 1430h.  Jorene Guest, III Pharm.D., CACP

## 2012-09-07 ENCOUNTER — Ambulatory Visit (INDEPENDENT_AMBULATORY_CARE_PROVIDER_SITE_OTHER): Payer: Medicaid Other | Admitting: Pharmacist

## 2012-09-07 ENCOUNTER — Other Ambulatory Visit: Payer: Self-pay

## 2012-09-07 ENCOUNTER — Ambulatory Visit (INDEPENDENT_AMBULATORY_CARE_PROVIDER_SITE_OTHER): Payer: Medicaid Other | Admitting: Internal Medicine

## 2012-09-07 ENCOUNTER — Emergency Department (HOSPITAL_COMMUNITY): Payer: Medicaid Other

## 2012-09-07 ENCOUNTER — Encounter (HOSPITAL_COMMUNITY): Payer: Self-pay | Admitting: Family Medicine

## 2012-09-07 ENCOUNTER — Inpatient Hospital Stay (HOSPITAL_COMMUNITY)
Admission: EM | Admit: 2012-09-07 | Discharge: 2012-09-09 | DRG: 690 | Disposition: A | Discharge status: Home / Self Care | Payer: Medicaid Other | Attending: Internal Medicine | Admitting: Internal Medicine

## 2012-09-07 ENCOUNTER — Encounter: Payer: Self-pay | Admitting: Internal Medicine

## 2012-09-07 VITALS — BP 146/89 | HR 102 | Temp 96.9°F | Ht 75.0 in | Wt 204.2 lb

## 2012-09-07 DIAGNOSIS — N10 Acute tubulo-interstitial nephritis: Secondary | ICD-10-CM

## 2012-09-07 DIAGNOSIS — Q613 Polycystic kidney, unspecified: Secondary | ICD-10-CM

## 2012-09-07 DIAGNOSIS — I2699 Other pulmonary embolism without acute cor pulmonale: Secondary | ICD-10-CM

## 2012-09-07 DIAGNOSIS — N189 Chronic kidney disease, unspecified: Secondary | ICD-10-CM

## 2012-09-07 DIAGNOSIS — B20 Human immunodeficiency virus [HIV] disease: Secondary | ICD-10-CM | POA: Diagnosis present

## 2012-09-07 DIAGNOSIS — Z86718 Personal history of other venous thrombosis and embolism: Secondary | ICD-10-CM

## 2012-09-07 DIAGNOSIS — I10 Essential (primary) hypertension: Secondary | ICD-10-CM | POA: Diagnosis present

## 2012-09-07 DIAGNOSIS — Z86711 Personal history of pulmonary embolism: Secondary | ICD-10-CM

## 2012-09-07 DIAGNOSIS — N12 Tubulo-interstitial nephritis, not specified as acute or chronic: Secondary | ICD-10-CM

## 2012-09-07 DIAGNOSIS — D638 Anemia in other chronic diseases classified elsewhere: Secondary | ICD-10-CM | POA: Diagnosis present

## 2012-09-07 DIAGNOSIS — N184 Chronic kidney disease, stage 4 (severe): Secondary | ICD-10-CM

## 2012-09-07 DIAGNOSIS — G8929 Other chronic pain: Secondary | ICD-10-CM | POA: Diagnosis present

## 2012-09-07 DIAGNOSIS — Z7901 Long term (current) use of anticoagulants: Secondary | ICD-10-CM

## 2012-09-07 DIAGNOSIS — R310 Gross hematuria: Secondary | ICD-10-CM

## 2012-09-07 DIAGNOSIS — N186 End stage renal disease: Secondary | ICD-10-CM | POA: Diagnosis present

## 2012-09-07 DIAGNOSIS — M549 Dorsalgia, unspecified: Secondary | ICD-10-CM | POA: Diagnosis present

## 2012-09-07 DIAGNOSIS — Z21 Asymptomatic human immunodeficiency virus [HIV] infection status: Secondary | ICD-10-CM | POA: Diagnosis present

## 2012-09-07 LAB — URINALYSIS, ROUTINE W REFLEX MICROSCOPIC
Glucose, UA: NEGATIVE mg/dL
Ketones, ur: 15 mg/dL — AB
Protein, ur: >300 mg/dL — AB
Urobilinogen, UA: 1 mg/dL (ref 0.0–1.0)

## 2012-09-07 LAB — CBC WITH DIFFERENTIAL/PLATELET
Basophils Absolute: 0 10*3/uL (ref 0.0–0.1)
Eosinophils Absolute: 0.1 10*3/uL (ref 0.0–0.7)
Eosinophils Relative: 2 % (ref 0–5)
HCT: 30 % — ABNORMAL LOW (ref 39.0–52.0)
Lymphocytes Relative: 35 % (ref 12–46)
MCH: 28.5 pg (ref 26.0–34.0)
MCHC: 32.7 g/dL (ref 30.0–36.0)
MCV: 87.2 fL (ref 78.0–100.0)
Monocytes Absolute: 0.5 10*3/uL (ref 0.1–1.0)
RDW: 14.4 % (ref 11.5–15.5)
WBC: 6.2 10*3/uL (ref 4.0–10.5)

## 2012-09-07 LAB — BASIC METABOLIC PANEL
BUN: 62 mg/dL — ABNORMAL HIGH (ref 6–23)
CO2: 17 mEq/L — ABNORMAL LOW (ref 19–32)
Calcium: 8.6 mg/dL (ref 8.4–10.5)
Creatinine, Ser: 6.86 mg/dL — ABNORMAL HIGH (ref 0.50–1.35)
GFR calc non Af Amer: 9 mL/min — ABNORMAL LOW (ref >90)
Glucose, Bld: 92 mg/dL (ref 70–99)

## 2012-09-07 LAB — URINE MICROSCOPIC-ADD ON

## 2012-09-07 MED ORDER — NICOTINE 14 MG/24HR TD PT24
14.0000 mg | MEDICATED_PATCH | Freq: Once | TRANSDERMAL | Status: AC
  Administered 2012-09-08: 14 mg via TRANSDERMAL
  Filled 2012-09-07: qty 1

## 2012-09-07 MED ORDER — LIDOCAINE HCL (PF) 1 % IJ SOLN
INTRAMUSCULAR | Status: AC
  Administered 2012-09-07: 0.9 mL
  Filled 2012-09-07: qty 5

## 2012-09-07 MED ORDER — LIDOCAINE HCL (PF) 1 % IJ SOLN
INTRAMUSCULAR | Status: AC
  Filled 2012-09-07: qty 5

## 2012-09-07 MED ORDER — DEXTROSE 5 % IV SOLN
1.0000 g | Freq: Once | INTRAVENOUS | Status: AC
  Administered 2012-09-08: 1 g via INTRAVENOUS
  Filled 2012-09-07: qty 10

## 2012-09-07 MED ORDER — AZITHROMYCIN 250 MG PO TABS
1000.0000 mg | ORAL_TABLET | Freq: Once | ORAL | Status: AC
  Administered 2012-09-07: 1000 mg via ORAL
  Filled 2012-09-07: qty 4

## 2012-09-07 MED ORDER — CEFTRIAXONE SODIUM 250 MG IJ SOLR
250.0000 mg | Freq: Once | INTRAMUSCULAR | Status: AC
  Administered 2012-09-07: 250 mg via INTRAMUSCULAR
  Filled 2012-09-07: qty 250

## 2012-09-07 MED ORDER — SODIUM POLYSTYRENE SULFONATE 15 GM/60ML PO SUSP
30.0000 g | Freq: Once | ORAL | Status: AC
  Administered 2012-09-08: 30 g via ORAL
  Filled 2012-09-07: qty 60

## 2012-09-07 NOTE — Assessment & Plan Note (Signed)
INR 2.3 therapeutic level 09/07/2012

## 2012-09-07 NOTE — Progress Notes (Signed)
  Subjective:    Patient ID: Brian [name], male    DOB: [birth year], 36 y.o.   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  Pt states that he can not give a sample of urine right now but has brought in a ziplock bag of 1 day old urine which is the color of mud.  Furthermore, he is declining catheterization or physical exam because he has an "unusual penis that curves and has the pee hole on the shaft". He is inquiring if he "can just get antibiotics for a UTI".  Of note, Nephrology has recommended dialysis for which he has a fistula and possible kidney transplant which the patient is resistant to agreeing to at this time. In addition he had an appointment with urology 08/28/2012 which he did not keep.  Review of Systems  Constitutional: Positive for fever. Negative for chills and fatigue.  Cardiovascular: Negative for palpitations.  Genitourinary: Positive for hematuria, flank pain, discharge and difficulty urinating. Negative for frequency, penile swelling, scrotal swelling, genital sores, penile pain and testicular pain.  Musculoskeletal: Positive for back pain.  Neurological: Negative for weakness and numbness.  Hematological: Does not bruise/bleed easily.       Objective:   Physical Exam  Constitutional: He is oriented to person, place, and time. He appears well-developed and well-nourished.  HENT:  Head: Normocephalic and atraumatic.  Eyes: Conjunctivae normal and EOM are normal. Pupils are equal, round, and reactive to light.  Cardiovascular: Normal heart sounds.   Tachycardia present.   Abdominal: Soft.  Genitourinary:     Musculoskeletal: He exhibits tenderness.       Back:  Neurological: He is alert and oriented to person, place, and time.  Skin: Skin is warm and dry.  Psychiatric: His mood appears anxious.          Assessment & Plan:  -Acute pyelonephritis,, complicated: in setting of polycystic kidney disease. --pt is unable to void and refuses in/out cath due to his penile and abnormality which he states causes great pain --would like to get urinalysis, urine culture, bllod culture, BMET to access BUN/Cr, CBC for leukocytosis and CT of abdomen -will send to ED for further evaluation

## 2012-09-07 NOTE — Progress Notes (Signed)
Anti-Coagulation Progress Note  Brian [name] is a 36 y.o. male who is currently on an anti-coagulation regimen.    RECENT RESULTS: Recent results are below, the most recent result is correlated with a dose of 42.5 mg. per week:  Patient is to be seen by Dr. Michail Sermon today. I have discussed with Dr. Lynnae January who agrees that patient needs to be seen today as opposed to waiting for the Friday appointment. Lab Results  Component Value Date   INR 2.30 09/07/2012   INR 5.20 08/24/2012   INR 3.00 07/20/2012    ANTI-COAG DOSE:   Latest dosing instructions   Total Sun Mon Tue Wed Thu Fri Sat   42.5 7.5 mg 5 mg 7.5 mg 5 mg 5 mg 7.5 mg 5 mg    (5 mg1.5) (5 mg1) (5 mg1.5) (5 mg1) (5 mg1) (5 mg1.5) (5 mg1)         ANTICOAG SUMMARY: Anticoagulation Episode Summary              Current INR goal 2.0-3.0 Next INR check 09/28/2012   INR from last check 2.30 (09/07/2012)     Weekly max dose (mg)  Target end date Indefinite   Indications History of DVT (deep vein thrombosis) (Resolved), Encounter for long-term (current) use of anticoagulants   [facility] check location [facility] Pref[facility]    Send INR reminders to    Comments             ANTICOAG TODAY: Anticoagulation Summary as of 09/07/2012              INR goal 2.0-3.0     Selected INR 2.30 (09/07/2012) Next INR check 09/28/2012   Weekly max dose (mg)  Target end date Indefinite   Indications History of DVT (deep vein thrombosis) (Resolved), Encounter for long-term (current) use of anticoagulants    Anticoagulation Episode Summary              INR check location [facility]    Send INR reminders to    Comments             PATIENT INSTRUCTIONS: Patient Instructions  Patient instructed to take medications as defined in the Anti-coagulation Track section of this encounter.  Patient instructed to take today's dose.  Patient verbalized understanding of these instructions.        FOLLOW-UP Return for  Follow up INR at 2:45PM.  Jorene Guest, III Pharm.D., CACP
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
## 2012-09-07 NOTE — Assessment & Plan Note (Signed)
Pt last CT 4/13 shows largest cysts 9cm diameter and filling much of upper abdomen.

## 2012-09-07 NOTE — ED Notes (Addendum)
Patient unable to urinate, will call when able

## 2012-09-07 NOTE — Assessment & Plan Note (Signed)
Baseline creatinine 4-5 with BUN 40-50.

## 2012-09-07 NOTE — Patient Instructions (Signed)
Patient instructed to take medications as defined in the Anti-coagulation Track section of this encounter.  Patient instructed to take today's dose.  Patient verbalized understanding of these instructions.    

## 2012-09-07 NOTE — ED Notes (Signed)
Per pt sts has white thick and blood tinged penile discharge that comes after he has slept all night. sts once he clears it he is able to pee. sts no sexual contact in 2 years.

## 2012-09-07 NOTE — ED Notes (Signed)
Unable to given urine specimen; water given; urine cup at bedside

## 2012-09-07 NOTE — Assessment & Plan Note (Addendum)
Occuring for ~5 months with clots. On Coumadin with INR today 2.3.  Pt was referred to urology by nephrologist for surgical consult secondary to hemorrhagic cyst rupture producing hematuria. Was suppose to get further evaluation by Urology 08/28/2012 but did not keep appointment.

## 2012-09-08 ENCOUNTER — Encounter (HOSPITAL_COMMUNITY): Payer: Self-pay

## 2012-09-08 DIAGNOSIS — B20 Human immunodeficiency virus [HIV] disease: Secondary | ICD-10-CM

## 2012-09-08 DIAGNOSIS — N10 Acute tubulo-interstitial nephritis: Principal | ICD-10-CM

## 2012-09-08 DIAGNOSIS — N186 End stage renal disease: Secondary | ICD-10-CM

## 2012-09-08 DIAGNOSIS — Q613 Polycystic kidney, unspecified: Secondary | ICD-10-CM

## 2012-09-08 DIAGNOSIS — M549 Dorsalgia, unspecified: Secondary | ICD-10-CM

## 2012-09-08 LAB — RENAL FUNCTION PANEL
CO2: 14 mEq/L — ABNORMAL LOW (ref 19–32)
Calcium: 8.3 mg/dL — ABNORMAL LOW (ref 8.4–10.5)
Chloride: 101 mEq/L (ref 96–112)
GFR calc Af Amer: 11 mL/min — ABNORMAL LOW (ref >90)
GFR calc non Af Amer: 10 mL/min — ABNORMAL LOW (ref >90)
Sodium: 134 mEq/L — ABNORMAL LOW (ref 135–145)

## 2012-09-08 LAB — CBC
HCT: 28.2 % — ABNORMAL LOW (ref 39.0–52.0)
Hemoglobin: 9.1 g/dL — ABNORMAL LOW (ref 13.0–17.0)
WBC: 5.8 10*3/uL (ref 4.0–10.5)

## 2012-09-08 LAB — PROTIME-INR: INR: 2.3 — ABNORMAL HIGH (ref 0.00–1.49)

## 2012-09-08 MED ORDER — EFAVIRENZ 600 MG PO TABS
600.0000 mg | ORAL_TABLET | Freq: Every day | ORAL | Status: DC
  Administered 2012-09-08 (×2): 600 mg via ORAL
  Filled 2012-09-08 (×3): qty 1

## 2012-09-08 MED ORDER — OXYCODONE HCL 5 MG PO TABS
5.0000 mg | ORAL_TABLET | Freq: Three times a day (TID) | ORAL | Status: DC | PRN
  Administered 2012-09-08 – 2012-09-09 (×4): 5 mg via ORAL
  Filled 2012-09-08 (×4): qty 1

## 2012-09-08 MED ORDER — EMTRICITABINE-TENOFOVIR DF 200-300 MG PO TABS
1.0000 | ORAL_TABLET | Freq: Every day | ORAL | Status: DC
  Administered 2012-09-08: 1 via ORAL
  Filled 2012-09-08: qty 1

## 2012-09-08 MED ORDER — LAMIVUDINE 10 MG/ML PO SOLN
100.0000 mg | Freq: Every day | ORAL | Status: DC
  Administered 2012-09-08 – 2012-09-09 (×2): 100 mg via ORAL
  Filled 2012-09-08 (×2): qty 10

## 2012-09-08 MED ORDER — CIPROFLOXACIN HCL 500 MG PO TABS
500.0000 mg | ORAL_TABLET | Freq: Every day | ORAL | Status: DC
  Administered 2012-09-08: 500 mg via ORAL
  Filled 2012-09-08 (×2): qty 1

## 2012-09-08 MED ORDER — KIDNEY FAILURE BOOK
Freq: Once | Status: AC
  Administered 2012-09-08: 15:00:00
  Filled 2012-09-08: qty 1

## 2012-09-08 MED ORDER — AMLODIPINE BESYLATE 10 MG PO TABS
10.0000 mg | ORAL_TABLET | Freq: Every day | ORAL | Status: DC
  Administered 2012-09-08: 10 mg via ORAL
  Filled 2012-09-08: qty 1

## 2012-09-08 MED ORDER — SODIUM BICARBONATE 650 MG PO TABS
1300.0000 mg | ORAL_TABLET | Freq: Three times a day (TID) | ORAL | Status: DC
  Administered 2012-09-08 – 2012-09-09 (×3): 1300 mg via ORAL
  Filled 2012-09-08 (×7): qty 2

## 2012-09-08 MED ORDER — CIPROFLOXACIN IN D5W 400 MG/200ML IV SOLN
400.0000 mg | Freq: Every day | INTRAVENOUS | Status: DC
  Administered 2012-09-08: 400 mg via INTRAVENOUS
  Filled 2012-09-08 (×2): qty 200

## 2012-09-08 MED ORDER — MORPHINE SULFATE 2 MG/ML IJ SOLN
2.0000 mg | INTRAMUSCULAR | Status: DC | PRN
  Administered 2012-09-08 (×4): 4 mg via INTRAVENOUS
  Administered 2012-09-09: 2 mg via INTRAVENOUS
  Administered 2012-09-09 (×4): 4 mg via INTRAVENOUS
  Filled 2012-09-08 (×4): qty 2
  Filled 2012-09-08: qty 1
  Filled 2012-09-08 (×4): qty 2

## 2012-09-08 MED ORDER — MORPHINE SULFATE 2 MG/ML IJ SOLN
1.0000 mg | INTRAMUSCULAR | Status: DC | PRN
  Administered 2012-09-08 (×2): 1 mg via INTRAVENOUS
  Filled 2012-09-08 (×2): qty 1

## 2012-09-08 MED ORDER — LAMIVUDINE 100 MG PO TABS: 100.0000 mg | ORAL_TABLET | Freq: Every day | ORAL | Status: DC

## 2012-09-08 MED ORDER — PROMETHAZINE HCL 25 MG PO TABS: 25.0000 mg | ORAL_TABLET | Freq: Four times a day (QID) | ORAL | Status: DC | PRN

## 2012-09-08 MED ORDER — ZIDOVUDINE 100 MG PO CAPS
300.0000 mg | ORAL_CAPSULE | Freq: Two times a day (BID) | ORAL | Status: DC
  Administered 2012-09-08 – 2012-09-09 (×2): 300 mg via ORAL
  Filled 2012-09-08 (×3): qty 3

## 2012-09-08 MED ORDER — WARFARIN - PHYSICIAN DOSING INPATIENT
Freq: Every day | Status: DC
  Administered 2012-09-08: 17:00:00

## 2012-09-08 MED ORDER — AMLODIPINE BESYLATE 10 MG PO TABS
10.0000 mg | ORAL_TABLET | Freq: Every day | ORAL | Status: DC
  Filled 2012-09-08: qty 1

## 2012-09-08 MED ORDER — OXYCODONE-ACETAMINOPHEN 5-325 MG PO TABS
1.0000 | ORAL_TABLET | Freq: Three times a day (TID) | ORAL | Status: DC | PRN
  Administered 2012-09-08 – 2012-09-09 (×4): 1 via ORAL
  Filled 2012-09-08 (×4): qty 1

## 2012-09-08 MED ORDER — WARFARIN SODIUM 5 MG PO TABS
5.0000 mg | ORAL_TABLET | ORAL | Status: DC
  Filled 2012-09-08: qty 1

## 2012-09-08 MED ORDER — WARFARIN SODIUM 7.5 MG PO TABS
7.5000 mg | ORAL_TABLET | ORAL | Status: DC
  Administered 2012-09-08: 7.5 mg via ORAL
  Filled 2012-09-08 (×2): qty 1

## 2012-09-08 MED ORDER — ZIDOVUDINE 300 MG PO TABS: 300.0000 mg | ORAL_TABLET | Freq: Two times a day (BID) | ORAL | Status: DC

## 2012-09-08 MED ORDER — OXYCODONE-ACETAMINOPHEN 10-325 MG PO TABS: 1.0000 | ORAL_TABLET | Freq: Three times a day (TID) | ORAL | Status: DC | PRN

## 2012-09-08 MED ORDER — EMTRICITABINE-TENOFOVIR DF 200-300 MG PO TABS: 1.0000 | ORAL_TABLET | ORAL | Status: DC

## 2012-09-08 MED ORDER — ACETAMINOPHEN 650 MG RE SUPP: 650.0000 mg | Freq: Four times a day (QID) | RECTAL | Status: DC | PRN

## 2012-09-08 MED ORDER — WARFARIN SODIUM 5 MG PO TABS: 5.0000 mg | ORAL_TABLET | Freq: Every day | ORAL | Status: DC

## 2012-09-08 MED ORDER — ACETAMINOPHEN 325 MG PO TABS: 650.0000 mg | ORAL_TABLET | Freq: Four times a day (QID) | ORAL | Status: DC | PRN

## 2012-09-08 MED ORDER — MIRTAZAPINE 15 MG PO TABS
15.0000 mg | ORAL_TABLET | Freq: Every day | ORAL | Status: DC
  Administered 2012-09-08 (×2): 15 mg via ORAL
  Filled 2012-09-08 (×3): qty 1

## 2012-09-08 MED ORDER — INFLUENZA VIRUS VACC SPLIT PF IM SUSP
0.5000 mL | Freq: Once | INTRAMUSCULAR | Status: AC
  Administered 2012-09-08: 0.5 mL via INTRAMUSCULAR
  Filled 2012-09-08 (×2): qty 0.5

## 2012-09-08 NOTE — Progress Notes (Signed)
ANTIBIOTIC CONSULT NOTE - FOLLOW UP  Pharmacy Consult for cipro Indication: pyelonephritis  Assessment: 36 year old male with pyelonephritis, antibiotic therapy will continue with cipro but will change to po today. Patient's crcl noted to be ~27ml/min, will adjust dosing accordingly.  Goal of Therapy:  Eradication of infection  Plan:  Change cipro to 500mg  po q 24h  No Known Allergies  Vital Signs: Temp: 97.5 F (36.4 C) (09/24 1351) Temp src: Oral (09/24 0909) BP: 130/81 mmHg (09/24 1351) Pulse Rate: 85  (09/24 1351) Intake/Output from previous day: 09/23 0701 - 09/24 0700 In: 240 [P.O.:240] Out: 400 [Urine:400] Intake/Output from this shift: Total I/O In: 480 [P.O.:480] Out: -   Labs:  Basename 09/08/12 0530 09/07/12 1950  WBC 5.8 6.2  HGB 9.1* 9.8*  PLT 296 279  LABCREA -- --  CREATININE 6.60* 6.86*   Estimated Creatinine Clearance: 18.6 ml/min (by C-G formula based on Cr of 6.6). No results found for this basename: VANCOTROUGH:2,VANCOPEAK:2,VANCORANDOM:2,GENTTROUGH:2,GENTPEAK:2,GENTRANDOM:2,TOBRATROUGH:2,TOBRAPEAK:2,TOBRARND:2,AMIKACINPEAK:2,AMIKACINTROU:2,AMIKACIN:2, in the last 72 hours   Microbiology: Recent Results (from the past 720 hour(s))  MRSA PCR SCREENING     Status: Abnormal   Collection Time   09/08/12  2:31 AM      Component Value Range Status Comment   MRSA by PCR POSITIVE (*) NEGATIVE Final     Anti-infectives     Start     Dose/Rate Route Frequency Ordered Stop   09/11/12 1000   emtricitabine-tenofovir (TRUVADA) 200-300 MG per tablet 1 tablet  Status:  Discontinued        1 tablet Oral every 72 hours 09/08/12 1008 09/08/12 1347   09/08/12 2200   ciprofloxacin (CIPRO) tablet 500 mg        500 mg Oral Daily at bedtime 09/08/12 1714     09/08/12 2000   zidovudine (RETROVIR) capsule 300 mg        300 mg Oral 2 times daily 09/08/12 1347     09/08/12 1600   lamiVUDine (EPIVIR) 10 MG/ML solution 100 mg        100 mg Oral Daily 09/08/12  1347     09/08/12 1000   lamivudine (EPIVIR) tablet 100 mg  Status:  Discontinued        100 mg Oral Daily 09/08/12 0208 09/08/12 0221   09/08/12 1000   emtricitabine-tenofovir (TRUVADA) 200-300 MG per tablet 1 tablet  Status:  Discontinued        1 tablet Oral Daily 09/08/12 0114 09/08/12 1003   09/08/12 0300   efavirenz (SUSTIVA) tablet 600 mg        600 mg Oral Daily at bedtime 09/08/12 0039     09/08/12 0300   ciprofloxacin (CIPRO) IVPB 400 mg  Status:  Discontinued     Comments: Use Cipro 400 mg IV q24h for CrCl < 30 mL/min      400 mg 200 mL/hr over 60 Minutes Intravenous Daily at bedtime 09/08/12 0208 09/08/12 1714   09/08/12 0215   zidovudine (RETROVIR) tablet 300 mg  Status:  Discontinued        300 mg Oral 2 times daily 09/08/12 0208 09/08/12 0224   09/07/12 2300   cefTRIAXone (ROCEPHIN) 1 g in dextrose 5 % 50 mL IVPB        1 g 100 mL/hr over 30 Minutes Intravenous  Once 09/07/12 2257 09/08/12 0147   09/07/12 2130   cefTRIAXone (ROCEPHIN) injection 250 mg        250 mg Intramuscular  Once 09/07/12 2124 09/07/12 2234  09/07/12 2130   azithromycin (ZITHROMAX) tablet 1,000 mg        1,000 mg Oral  Once 09/07/12 2124 09/07/12 2235          Georgina Peer 09/08/2012,5:15 PM

## 2012-09-08 NOTE — Progress Notes (Signed)
Truvada ordered as daily but pt's has stage IV renal failure. I have adjusted the dose to reflect his renal function.   Thanks
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
## 2012-09-08 NOTE — H&P (Signed)
Internal Medicine Teaching Service Attending Note Date: 09/08/2012  Patient name: Brian [name]  Medical record number: [id number]  Date of birth: 21-Aug-1976   I have seen and evaluated [name] and discussed their care with the Residency Team.    Mr. Freet is a 36yo man with Stage 4 CKD due to adult PCKD, HIV who presented with a 6 day history of right sided flank pain.  He notes that the pain is 9/10 in the right flank and is worsened by movement and with inspiration.  Associated symptoms include slimy urethral discharge which cause some decreased urine stream in the morning.  Also in his urine, he noticed clots and white pus like material.  His urine is intermittently red-brown and this is due to chronic hematuria (likely from PCKD?).  He has chronic left sided flank pain, however, this time it is on the left and much worse.  Further associated symptoms include subjective fevers, chills, decreased PO intake and appetite.    He specifically denied nausea, vomiting, abdominal distention, edema, chest pain, difficulty breathing.   PMH/PSH/PFSH/Meds/Allergies per resident note.   Physical Exam: Blood pressure 139/89, pulse 105, temperature 98.2 F (36.8 C), temperature source Oral, resp. rate 16, height 6\' 4"  (1.93 m), weight 201 lb 4.5 oz (91.3 kg), SpO2 95.00%. General appearance: alert, cooperative, appears stated age and no distress Head: Normocephalic, without obvious abnormality, atraumatic Eyes: no incterus, conjunctivae clear, EOMI Lungs: clear to auscultation bilaterally Heart: regular rate and rhythm and S1, S2 normal Abdomen: somewhat distended (noted to be patients baseline) but soft.  TTP lightly in the RUQ and RLQ.  NT in Left side.  +BS throughout.   Extremities: No edema, AV fistula in RUE, + thrill Skin: No rash or lesion noted Neurologic: Grossly normal  Lab results: Results for orders placed during the hospital encounter of 09/07/12 (from the past 24 hour(s))  BASIC  METABOLIC PANEL     Status: Abnormal   Collection Time   09/07/12  7:50 PM      Component Value Range   Sodium 134 (*) 135 - 145 mEq/L   Potassium 5.2 (*) 3.5 - 5.1 mEq/L   Chloride 103  96 - 112 mEq/L   CO2 17 (*) 19 - 32 mEq/L   Glucose, Bld 92  70 - 99 mg/dL   BUN 62 (*) 6 - 23 mg/dL   Creatinine, Ser 6.86 (*) 0.50 - 1.35 mg/dL   Calcium 8.6  8.4 - 10.5 mg/dL   GFR calc non Af Amer 9 (*) >90 mL/min   GFR calc Af Amer 11 (*) >90 mL/min  MAGNESIUM     Status: Abnormal   Collection Time   09/07/12  7:50 PM      Component Value Range   Magnesium 2.6 (*) 1.5 - 2.5 mg/dL  CBC WITH DIFFERENTIAL     Status: Abnormal   Collection Time   09/07/12  7:50 PM      Component Value Range   WBC 6.2  4.0 - 10.5 K/uL   RBC 3.44 (*) 4.22 - 5.81 MIL/uL   Hemoglobin 9.8 (*) 13.0 - 17.0 g/dL   HCT 30.0 (*) 39.0 - 52.0 %   MCV 87.2  78.0 - 100.0 fL   MCH 28.5  26.0 - 34.0 pg   MCHC 32.7  30.0 - 36.0 g/dL   RDW 14.4  11.5 - 15.5 %   Platelets 279  150 - 400 K/uL   Neutrophils Relative 54  43 -  77 %   Neutro Abs 3.3  1.7 - 7.7 K/uL   Lymphocytes Relative 35  12 - 46 %   Lymphs Abs 2.2  0.7 - 4.0 K/uL   Monocytes Relative 8  3 - 12 %   Monocytes Absolute 0.5  0.1 - 1.0 K/uL   Eosinophils Relative 2  0 - 5 %   Eosinophils Absolute 0.1  0.0 - 0.7 K/uL   Basophils Relative 1  0 - 1 %   Basophils Absolute 0.0  0.0 - 0.1 K/uL  URINALYSIS, ROUTINE W REFLEX MICROSCOPIC     Status: Abnormal   Collection Time   09/07/12  9:38 PM      Component Value Range   Color, Urine RED (*) YELLOW   APPearance TURBID (*) CLEAR   Specific Gravity, Urine 1.014  1.005 - 1.030   pH 6.5  5.0 - 8.0   Glucose, UA NEGATIVE  NEGATIVE mg/dL   Hgb urine dipstick LARGE (*) NEGATIVE   Bilirubin Urine MODERATE (*) NEGATIVE   Ketones, ur 15 (*) NEGATIVE mg/dL   Protein, ur >300 (*) NEGATIVE mg/dL   Urobilinogen, UA 1.0  0.0 - 1.0 mg/dL   Nitrite POSITIVE (*) NEGATIVE   Leukocytes, UA LARGE (*) NEGATIVE  URINE  MICROSCOPIC-ADD ON     Status: Abnormal   Collection Time   09/07/12  9:38 PM      Component Value Range   Squamous Epithelial / LPF RARE  RARE   WBC, UA TOO NUMEROUS TO COUNT  <3 WBC/hpf   RBC / HPF TOO NUMEROUS TO COUNT  <3 RBC/hpf   Bacteria, UA MANY (*) RARE  MRSA PCR SCREENING     Status: Abnormal   Collection Time   09/08/12  2:31 AM      Component Value Range   MRSA by PCR POSITIVE (*) NEGATIVE  CBC     Status: Abnormal   Collection Time   09/08/12  5:30 AM      Component Value Range   WBC 5.8  4.0 - 10.5 K/uL   RBC 3.25 (*) 4.22 - 5.81 MIL/uL   Hemoglobin 9.1 (*) 13.0 - 17.0 g/dL   HCT 28.2 (*) 39.0 - 52.0 %   MCV 86.8  78.0 - 100.0 fL   MCH 28.0  26.0 - 34.0 pg   MCHC 32.3  30.0 - 36.0 g/dL   RDW 14.4  11.5 - 15.5 %   Platelets 296  150 - 400 K/uL  PROTIME-INR     Status: Abnormal   Collection Time   09/08/12  5:30 AM      Component Value Range   Prothrombin Time 24.3 (*) 11.6 - 15.2 seconds   INR 2.30 (*) 0.00 - 1.49  RENAL FUNCTION PANEL     Status: Abnormal   Collection Time   09/08/12  5:30 AM      Component Value Range   Sodium 134 (*) 135 - 145 mEq/L   Potassium 4.1  3.5 - 5.1 mEq/L   Chloride 101  96 - 112 mEq/L   CO2 14 (*) 19 - 32 mEq/L   Glucose, Bld 81  70 - 99 mg/dL   BUN 61 (*) 6 - 23 mg/dL   Creatinine, Ser 6.60 (*) 0.50 - 1.35 mg/dL   Calcium 8.3 (*) 8.4 - 10.5 mg/dL   Phosphorus 6.4 (*) 2.3 - 4.6 mg/dL   Albumin 3.0 (*) 3.5 - 5.2 g/dL   GFR calc non Af Amer 10 (*) >90  mL/min   GFR calc Af Amer 11 (*) >90 mL/min    Imaging results:  US Renal  09/07/2012  *RADIOLOGY REPORT*  Clinical Data: Penile discharge.  RENAL/URINARY TRACT ULTRASOUND COMPLETE  Comparison:  Abdominal CT 04/11/2012  Findings:  Right Kidney:  There are innumerable cysts throughout the right kidney.  The right kidney is markedly enlarged, roughly measuring 20 cm in length.  Many of the cysts are complex and echogenic. Findings are consistent with polycystic kidney disease.  There  is a large complex echogenic structure in the mid kidney that measures 5.1 x 5.5 cm.  This structure is likely a complex hemorrhagic cyst. Renal collecting system is difficult to evaluate.  Largest complex cystic structure is in the mid pole and roughly measuring up to 7.7 cm.  Left Kidney:  Left kidney is markedly enlarged, measuring up to 18.3 cm.  Similar to the right side, there are innumerable cysts and many are complex.  The largest cyst in the left kidney measures up to 5.0 cm.  Bladder:  Fluid in the urinary bladder and evidence for bilateral ureteral jets.  Prostate measures 4.7 x 4.3 x 2.1 cm.  IMPRESSION: Both kidneys are enlarged with innumerable complex cysts.  Findings are consistent with polycystic kidney disease.  There are bilateral ureteral jets in the bladder.  No evidence for obstruction.   Original Report Authenticated By: Markus Daft, M.D.     Assessment and Plan: I agree with the formulated Assessment and Plan with the following changes:   1. Pyelonephritis - Patient with UA consistent with urinary tract infection and new right sided pain in the CVA area and abdomen, concerning for pyelo.  He has numerous renal cysts and likely a hemorrhagic cyst per imaging - He is on IV Antibiotics - Trend WBC and fever (WBC wnl and no fever since being in house) - UC and BC X 2 pending  2. ESRD - Cr worsened to 6.86 in this acute illness - Nephrology consult to evaluate - PO hydration - Strict I/Os - Monitor BMP daily  Other chronic issues as per resident note.   Sid Falcon, MD 9/24/20131:36 PM

## 2012-09-08 NOTE — Progress Notes (Signed)
Patient complaining of ''bug bites" to right lower back and left forearm. Patient has multiple small whelps on back and arm. MD paged, will continue to monitor.
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
## 2012-09-09 ENCOUNTER — Encounter (HOSPITAL_COMMUNITY): Payer: Self-pay

## 2012-09-09 DIAGNOSIS — I120 Hypertensive chronic kidney disease with stage 5 chronic kidney disease or end stage renal disease: Secondary | ICD-10-CM

## 2012-09-09 LAB — URINE CULTURE: Colony Count: NO GROWTH

## 2012-09-09 LAB — RENAL FUNCTION PANEL
BUN: 53 mg/dL — ABNORMAL HIGH (ref 6–23)
CO2: 16 mEq/L — ABNORMAL LOW (ref 19–32)
Calcium: 8.5 mg/dL (ref 8.4–10.5)
Creatinine, Ser: 6.51 mg/dL — ABNORMAL HIGH (ref 0.50–1.35)
GFR calc non Af Amer: 10 mL/min — ABNORMAL LOW (ref >90)

## 2012-09-09 LAB — HEPATIC FUNCTION PANEL
ALT: 11 U/L (ref 0–53)
AST: 12 U/L (ref 0–37)
Alkaline Phosphatase: 71 U/L (ref 39–117)
Bilirubin, Direct: <0.1 mg/dL (ref 0.0–0.3)
Total Bilirubin: 0.1 mg/dL — ABNORMAL LOW (ref 0.3–1.2)

## 2012-09-09 LAB — CBC
MCH: 28.6 pg (ref 26.0–34.0)
MCHC: 32.9 g/dL (ref 30.0–36.0)
Platelets: 259 10*3/uL (ref 150–400)
RBC: 3.25 MIL/uL — ABNORMAL LOW (ref 4.22–5.81)
RDW: 14.4 % (ref 11.5–15.5)

## 2012-09-09 LAB — GC/CHLAMYDIA PROBE AMP, URINE: Chlamydia, Swab/Urine, PCR: NEGATIVE

## 2012-09-09 MED ORDER — NICOTINE 14 MG/24HR TD PT24
14.0000 mg | MEDICATED_PATCH | Freq: Every day | TRANSDERMAL | Status: DC
  Administered 2012-09-09: 14 mg via TRANSDERMAL
  Filled 2012-09-09: qty 1

## 2012-09-09 MED ORDER — CIPROFLOXACIN HCL 500 MG PO TABS: 500.0000 mg | ORAL_TABLET | Freq: Two times a day (BID) | ORAL | Status: DC

## 2012-09-09 MED ORDER — CIPROFLOXACIN HCL 500 MG PO TABS
500.0000 mg | ORAL_TABLET | Freq: Once | ORAL | Status: AC
  Administered 2012-09-09: 500 mg via ORAL
  Filled 2012-09-09: qty 1

## 2012-09-09 MED ORDER — MUPIROCIN 2 % EX OINT
1.0000 "application " | TOPICAL_OINTMENT | Freq: Two times a day (BID) | CUTANEOUS | Status: DC
  Administered 2012-09-09: 1 via NASAL
  Filled 2012-09-09: qty 22

## 2012-09-09 MED ORDER — CALCIUM ACETATE 667 MG PO CAPS
667.0000 mg | ORAL_CAPSULE | Freq: Three times a day (TID) | ORAL | Status: DC
  Filled 2012-09-09 (×3): qty 1

## 2012-09-09 MED ORDER — CHLORHEXIDINE GLUCONATE CLOTH 2 % EX PADS
6.0000 | MEDICATED_PAD | Freq: Every day | CUTANEOUS | Status: DC
  Administered 2012-09-09: 6 via TOPICAL

## 2012-09-09 MED ORDER — SODIUM BICARBONATE 650 MG PO TABS: 1300.0000 mg | ORAL_TABLET | Freq: Three times a day (TID) | ORAL | Status: DC

## 2012-09-09 NOTE — Progress Notes (Signed)
PA Student Daily Progress Note [facility] Subjective: Pt states he is feeling a little bit better. Still having right-sided flank pain that is a 8/10. No mucous with urination this morning. Tolerating PO. Denies any new complaints.   Objective:  Filed Vitals:   09/08/12 1351 09/08/12 1749 09/08/12 2215 09/09/12 0539  BP: 130/81 133/82 121/77 123/82  Pulse: 85 96 93 91  Temp: 97.5 F (36.4 C) 97.5 F (36.4 C) 97.9 F (36.6 C) 97.7 F (36.5 C)  TempSrc:   Oral Oral  Resp: 17 17 18 18   Height:      Weight:   90.2 kg (198 lb 13.7 oz)   SpO2: 100% 95% 97% 98%   Physical Exam: General appearance: alert, cooperative, appears older than stated age, sitting comfortably in bed. NAD  Head: Normocephalic, without obvious abnormality, atraumatic  Eyes: conjunctivae/corneas clear. PERRL, EOM's intact.  Back: symmetric, no curvature. ROM normal. No CVA tenderness.  Lungs: diminished breath sounds bilaterally  Chest wall: right sided chest wall tenderness  Heart: regular rate and rhythm, S1, S2 normal, no murmur, click, rub or gallop  Abdomen: Bowel sounds present in all four quadrants, tenderness to light and deep palpation in RUQ and RLQ. No suprapubic tenderness. Mildly distended.  Male genitalia: deferred due to pts wishes  Extremities: extremities normal, atraumatic, no cyanosis or edema. 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   1. Acute Pyelonephritis- Pt complains of R sided flank pain for 6 days, UA and micro suggest infection, pt is afebrile and white count normal. Pt is tolerating PO cipro  - Monitor WBC   - G & C cultures pending  2. Stage 5 CKD 2/2 APCKD- Pts baseline Cr is 5's and BUN 40-50. Today they are elevated at  Cr 6.51 and BUN 53. Pts GFR is down to 9 from a previous of 16. CT of kidneys showed increase in cyst numbers since 4/13. LFTs are WNL (AST 12, ALT 1, direct bili <.1), Phos is elevated at 7.5.  Pt is having a hard time understanding the depth of his kidney disease.   - Continue monitoring pts CBC and CMET   - will continue to evaluate for HD Not uremic and no indic for dialysis.  F/U with Dr. Hillery Hunter.  Cont Na bicarb. 3. HIV: Pts is on Truvada and last CD4 count on 8/6 was 840. His VL was undetectable at that time.   - Will continue per Infectious Disease recommendations   4. Chronic Anemia- pts baseline Hg is around 10 and is stable today at 9.3   - Will continue to monitor his CBC   5. 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 Elie Confer for INR checks. Last INR was 2.3 on 9/23.   - Will continue per primary team recommendations    Labs: Basic Metabolic Panel:  Lab [id number] 0630 09/08/12 0530 09/07/12 1950  NA 135 134* 134*  K 3.8 4.1 5.2*  CL 101 101 103  CO2 16* 14* 17*  GLUCOSE 82 81 92  BUN 53* 61* 62*  CREATININE 6.51* 6.60* 6.86*  CALCIUM 8.5 8.3* 8.6  ALB -- -- --  PHOS 7.5* 6.4* --   Liver Function Tests:  Lab 09/09/12  0630 09/08/12 0530  AST 12 --  ALT 11 --  ALKPHOS 71 --  BILITOT 0.1* --  PROT 7.3 --  ALBUMIN 3.0*3.0* 3.0*    CBC:  Lab 09/09/12 0630 09/08/12 0530 09/07/12 1950  WBC 5.5 5.8 6.2  NEUTROABS -- -- 3.3  HGB 9.3* 9.1* 9.8*  HCT 28.3* 28.2* 30.0*  MCV 87.1 86.8 87.2  PLT 259 296 279   Blood Culture    Component Value Date/Time   SDES URINE, RANDOM 03/22/2012 1041   SPECREQUEST NONE 03/22/2012 1041   CULT NO GROWTH 03/22/2012 1041   REPTSTATUS 03/24/2012 FINAL 03/22/2012 1041    Micro Results: Recent Results (from the past 240 hour(s))  MRSA PCR SCREENING     Status: Abnormal   Collection Time   09/08/12  2:31 AM      Component Value Range Status Comment   MRSA by PCR POSITIVE (*) NEGATIVE Final    Studies/Results: US  Renal  09/07/2012  *RADIOLOGY REPORT*  Clinical Data: Penile discharge.  RENAL/URINARY TRACT ULTRASOUND COMPLETE  Comparison:  Abdominal CT 04/11/2012  Findings:  Right Kidney:  There are innumerable cysts throughout the right kidney.  The right kidney is markedly enlarged, roughly measuring 20 cm in length.  Many of the cysts are complex and echogenic. Findings are consistent with polycystic kidney disease.  There is a large complex echogenic structure in the mid kidney that measures 5.1 x 5.5 cm.  This structure is likely a complex hemorrhagic cyst. Renal collecting system is difficult to evaluate.  Largest complex cystic structure is in the mid pole and roughly measuring up to 7.7 cm.  Left Kidney:  Left kidney is markedly enlarged, measuring up to 18.3 cm.  Similar to the right side, there are innumerable cysts and many are complex.  The largest cyst in the left kidney measures up to 5.0 cm.  Bladder:  Fluid in the urinary bladder and evidence for bilateral ureteral jets.  Prostate measures 4.7 x 4.3 x 2.1 cm.  IMPRESSION: Both kidneys are enlarged with innumerable complex cysts.  Findings are consistent with polycystic kidney disease.  There are bilateral ureteral jets in the bladder.  No evidence for obstruction.   Original Report Authenticated By: Markus Daft, M.D.    Medications: Scheduled Meds:   . amLODipine  10 mg Oral QHS  . ciprofloxacin  500 mg Oral QHS  . efavirenz  600 mg Oral QHS  . influenza  inactive virus vaccine  0.5 mL Intramuscular Once  . kidney failure book   Does not apply Once  . lamiVUDine  100 mg Oral Daily  . lidocaine      . mirtazapine  15 mg Oral QHS  . nicotine  14 mg Transdermal Once  . sodium bicarbonate  1,300 mg Oral TID  . warfarin  5 mg Oral Custom  . warfarin  7.5 mg Oral Custom  . Warfarin - Physician Dosing Inpatient   Does not apply q1800  . zidovudine  300 mg Oral BID  . DISCONTD: amLODipine  10 mg Oral Daily  . DISCONTD: ciprofloxacin  400 mg  Intravenous QHS  . DISCONTD: emtricitabine-tenofovir  1 tablet Oral Daily  . DISCONTD: emtricitabine-tenofovir  1 tablet Oral Q72H   Continuous Infusions:  PRN Meds:.acetaminophen, acetaminophen, morphine injection, oxyCODONE, oxyCODONE-acetaminophen, promethazine, DISCONTD:  morphine injection   This is a Careers information officer Note.  The care of the patient was discussed with Dr. Jimmy Footman and the assessment and plan formulated with their assistance.  Please see  their attached note for official documentation of the daily encounter.  Brian Maw, MA, PA-S2 09/09/2012, 8:07 AM  I have seen and examined this patient and agree with the plan of care  Seen and eval, will make sure has F/U.  No indic for dialysis.  Needs ^ bicarb..  Will S/o for now .  Brian [name] L 09/09/2012, 11:31 AM

## 2012-09-09 NOTE — Discharge Summary (Signed)
Patient Name:  Brian RUDDEN MRN: [id number]  PCP: [name], MD DOB:  21-Feb-1976       Date of Admission:  09/07/2012  Date of Discharge:  09/09/2012      Attending Physician: Sid Falcon, MD         DISCHARGE DIAGNOSES: 1.   Acute complicated pyelonephritis:  Diagnosed by urinalysis. Ciprofloxacin for 14 days. GC/Chlamydia pending. 2.   ESRD:  GFR 10. 1300mg  sodium bicarbonate TID recommended by nephrology consult. 3.   HIV:  Efavirenz 600mg  qHS, lamivudine 100mg  daily, and zidovudine 300mg  BID. 4.   VTE history:  INR therapeutic.  5.   Hypertension:  Stable.   DISPOSITION AND FOLLOW-UP: Brian [name] is to follow-up with the listed providers as detailed below, at which time, the following should be addressed:   1. Follow-up visits: 1. INTERNAL MEDICINE: 1. Follow up on GC/Chlamydia urine probe. 2. NEPHROLOGY: 1. Assess need for dialysis. 2. Assess adequacy of sodium bicarbonate and phosphorus level.  2. Labs / imaging needed: 1. BMET  3. Pending labs/ test needing follow-up: 1. GC/Chlamydia urine probe  Follow-up Information    Follow up with Louis Meckel, MD. On 09/15/2012. (NEPHROLOGY - Your appointment is at 3:45 on Tuesday, October 1.)    Contact information:   [place] [address] [phone]       Follow up with [name], MD. On 09/11/2012. (INTERNAL MEDICINE - Your appointment is at 3:15 on Friday, September 27.)    Contact information:   [address] [place] [address] [phone]         Discharge Orders    Future Appointments: Provider: Department: Dept Phone: Center:   09/11/2012 3:15 PM [name], MD [facility]   09/21/2012 2:45 PM [facility] [phone] [facility]   09/28/2012 2:45 PM [facility] [id number] [facility]   10/08/2012 10:15 AM Thayer Headings, MD [facility] [phone] [facility]     Future Orders  Please Complete By Expires   Diet - low sodium heart healthy      Increase activity slowly      Call MD for:  temperature >100.4      Call MD for:  persistant nausea and vomiting          DISCHARGE MEDICATIONS:   Medication List     As of 09/09/2012  2:20 PM    TAKE these medications         amLODipine 10 MG tablet   Commonly known as: NORVASC   Take 1 tablet (10 mg total) by mouth daily.      ciprofloxacin 500 MG tablet   Commonly known as: CIPRO   Take 1 tablet (500 mg total) by mouth 2 (two) times daily.      efavirenz 600 MG tablet   Commonly known as: SUSTIVA   Take 1 tablet (600 mg total) by mouth at bedtime. Take with AZT and 3TC      lamivudine 100 MG tablet   Commonly known as: EPIVIR   Take 100 mg by mouth daily.      mirtazapine 15 MG tablet   Commonly known as: REMERON   Take 1 tablet (15 mg total) by mouth at bedtime.      oxyCODONE-acetaminophen 10-325 MG per tablet   Commonly known as: PERCOCET   Take 1 tablet by mouth every 8 (eight) hours  as needed. For pain      promethazine 25 MG tablet   Commonly known as: PHENERGAN   Take 25 mg by mouth every 6 (six) hours as needed. For nausea      sodium bicarbonate 650 MG tablet   Take 2 tablets (1,300 mg total) by mouth 3 (three) times daily.      warfarin 5 MG tablet   Commonly known as: COUMADIN   Take 5-7.5 mg by mouth at bedtime. Sun, Tues, and Fri-1.5 tabs (total 7.5 mg), All other days-1 tab (total 5 mg)      zidovudine 300 MG tablet   Commonly known as: RETROVIR   Take 1 tablet (300 mg total) by mouth 2 (two) times daily.         CONSULTS:  1.   Nephrology   PROCEDURES PERFORMED:  US Renal 09/07/2012  FINDINGS:  Right Kidney:  There are innumerable cysts throughout the right kidney.  The right kidney is markedly enlarged, roughly measuring 20 cm in length.  Many of the cysts are complex and echogenic. Findings are consistent with polycystic kidney disease.  There is a large complex  echogenic structure in the mid kidney that measures 5.1 x 5.5 cm.  This structure is likely a complex hemorrhagic cyst. Renal collecting system is difficult to evaluate.  Largest complex cystic structure is in the mid pole and roughly measuring up to 7.7 cm.  Left Kidney:  Left kidney is markedly enlarged, measuring up to 18.3 cm.  Similar to the right side, there are innumerable cysts and many are complex.  The largest cyst in the left kidney measures up to 5.0 cm.  Bladder:  Fluid in the urinary bladder and evidence for bilateral ureteral jets.  Prostate measures 4.7 x 4.3 x 2.1 cm.   IMPRESSION: Both kidneys are enlarged with innumerable complex cysts.  Findings are consistent with polycystic kidney disease.  There are bilateral ureteral jets in the bladder.  No evidence for obstruction.      ADMISSION DATA: H&P: 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 Patient states that the pain is 9/10, located on his right flank, also associated with right anterior rib pain, worse with movement, worse with deep inspiration. Over the past 6 days, he is also had a slimy urethral discharge that clogs his urine stream every morning upon waking. The discharge is a mix of red clots and white pus-looking material. His urine is sometimes clear, sometimes a red-brown color. Patient has baseline hematuria about 2-3 weeks out of every month as well as chronic L sided flank pain. He does not remember having R sided pain like this before. He endorses fever, chills, decreased by mouth intake, decreased appetite. No new sexual partners, patient has been celibate for 2 years since HIV diagnosis.  He is concerned that he has a UTI. He did bring in a sample of urine to the clinic today because he states he is usually only able to urinate once or twice a  day, usually in the morning.  Denies nausea, vomiting, lower extremity edema, chest pain, shortness of breath. Patient states that he lives by himself in a studio apartment. Reports compliance with HIV meds.  Physical Exam: Vitals: Blood pressure 150/99, pulse 100, temperature 98 F (36.7 C), resp. rate 18, SpO2 98.00%.  General:  No acute distress, alert and oriented x 3  HEENT: PERRL, EOMI, no lymphadenopathy, slightly dry mucous membranes, poor dentition  Cardiovascular: Regular rate and rhythm, no murmurs, rubs or gallops  Respiratory: Clear to auscultation bilaterally, no wheezes, rales, or rhonchi  Abdomen: Soft, tender in the suprapubic region, positive bowel sounds  Back: Right CVA is exquisitely tender  Extremities: Right upper extremity AV fistula noted. + Thrill, bruit. Warm and well-perfused, no clubbing, cyanosis, or edema.  Skin: Warm, dry, no rashes  Neuro: Not anxious appearing, no depressed mood, normal affect  Labs: Basic Metabolic Panel:  Basename  09/07/12 1950   NA  134*   K  5.2*   CL  103   CO2  17*   GLUCOSE  92   BUN  62*   CREATININE  6.86*   CALCIUM  8.6   MG  2.6*   PHOS  --    CBC:  Basename  09/07/12 1950   WBC  6.2   NEUTROABS  3.3   HGB  9.8*   HCT  30.0*   MCV  87.2   PLT  279    Coagulation:  Basename  09/07/12 1513   LABPROT  --   INR  2.30    Urinalysis:  Basename  09/07/12 2138   COLORURINE  RED*   LABSPEC  1.014   PHURINE  6.5   GLUCOSEU  NEGATIVE   HGBUR  LARGE*   BILIRUBINUR  MODERATE*   KETONESUR  15*   PROTEINUR  >300*   UROBILINOGEN  1.0   NITRITE  POSITIVE*   LEUKOCYTESUR  LARGE*    Microscopic: many bacteria, tntc wbc, tntc rbc   HOSPITAL COURSE:  1.   Acute complicated pyelonephritis:  Admission urinalysis was positive for nitrates and had large leukocyte esterase.  Complicated by adult polycystic kidney disease and end-stage renal disease. There were many bacteria and many leukocytes. His urine culture has  subsequently been negative, but we will treat for 14 days total for acute complicated pyelonephritis. GC and chlamydia urine probe is still pending.  Ciprofloxacin 500mg  BID for 11 more days (14 days total).  2.   ESRD:  Creatinine is 6.51 today; GFR is 10. Bicarbonate remains low at 16 despite oral sodium bicarbonate. The potassium has corrected and is 3.8. Phosphorus remains elevated at 7.5, but calcium is normal at 8.5. Patient continues to want to postpone nephrectomy and dialysis. He follows up with Dr. Moshe Cipro on Friday. Oral sodium bicarbonate, 1300mg  TID continued at discharge at nephrology's recommendation.   3.   HIV:  CD4 count was 840 in August. Recently switched from Truvada to lamivudine and zidovudine by Dr. Linus Salmons.  Kept on efavirenz 600mg  qHS, lamivudine 100mg  daily, and zidovudine 300mg  BID.  4.   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 INR is 2.39. Continue current home warfarin regimen and follow up with Coumadin clinic as scheduled.   5.   Hypertension:  Blood pressures well controlled during hospitalization. Continue amlodipine 10mg  daily.   DISCHARGE DATA: Vital Signs: BP 123/82  Pulse 91  Temp 97.7 F (36.5 C) (Oral)  Resp 18  Ht 6\' 4"  (1.93 m)  Wt 198 lb 13.7 oz (90.2 kg)  BMI 24.21 kg/m2  SpO2 98%  Labs: Results for orders placed during the hospital encounter of 09/07/12 (from the past 24 hour(s))  RENAL FUNCTION PANEL     Status: Abnormal   Collection Time   09/09/12  6:30 AM  Component Value Range   Sodium 135  135 - 145 mEq/L   Potassium 3.8  3.5 - 5.1 mEq/L   Chloride 101  96 - 112 mEq/L   CO2 16 (*) 19 - 32 mEq/L   Glucose, Bld 82  70 - 99 mg/dL   BUN 53 (*) 6 - 23 mg/dL   Creatinine, Ser 6.51 (*) 0.50 - 1.35 mg/dL   Calcium 8.5  8.4 - 10.5 mg/dL   Phosphorus 7.5 (*) 2.3 - 4.6 mg/dL   Albumin 3.0 (*) 3.5 - 5.2 g/dL   GFR calc non Af Amer 10 (*) >90 mL/min   GFR calc Af Amer 11 (*) >90 mL/min  HEPATIC FUNCTION PANEL      Status: Abnormal   Collection Time   09/09/12  6:30 AM      Component Value Range   Total Protein 7.3  6.0 - 8.3 g/dL   Albumin 3.0 (*) 3.5 - 5.2 g/dL   AST 12  0 - 37 U/L   ALT 11  0 - 53 U/L   Alkaline Phosphatase 71  39 - 117 U/L   Total Bilirubin 0.1 (*) 0.3 - 1.2 mg/dL   Bilirubin, Direct <0.1  0.0 - 0.3 mg/dL   Indirect Bilirubin NOT CALCULATED  0.3 - 0.9 mg/dL  CBC     Status: Abnormal   Collection Time   09/09/12  6:30 AM      Component Value Range   WBC 5.5  4.0 - 10.5 K/uL   RBC 3.25 (*) 4.22 - 5.81 MIL/uL   Hemoglobin 9.3 (*) 13.0 - 17.0 g/dL   HCT 28.3 (*) 39.0 - 52.0 %   MCV 87.1  78.0 - 100.0 fL   MCH 28.6  26.0 - 34.0 pg   MCHC 32.9  30.0 - 36.0 g/dL   RDW 14.4  11.5 - 15.5 %   Platelets 259  150 - 400 K/uL  MAGNESIUM     Status: Normal   Collection Time   09/09/12  6:30 AM      Component Value Range   Magnesium 2.3  1.5 - 2.5 mg/dL  PROTIME-INR     Status: Abnormal   Collection Time   09/09/12  9:00 AM      Component Value Range   Prothrombin Time 25.0 (*) 11.6 - 15.2 seconds   INR 2.39 (*) 0.00 - 1.49    Time spent on discharge: 32 minutes   Signed by:  Shann Medal. Juleen China, MD PGY-I, Internal Medicine  09/09/2012, 2:20 PM

## 2012-09-09 NOTE — Progress Notes (Signed)
Subjective:    Interval Events:  Right flank pain is improved.  Patient has not seen gross hematuria or clots in his urine.  His urine is dark.  No urethral discharge.    Objective:    Vital Signs:   Temp:  [97.5 F (36.4 C)-97.9 F (36.6 C)] 97.7 F (36.5 C) (09/25 0539) Pulse Rate:  [91-96] 91  (09/25 0539) Resp:  [17-18] 18  (09/25 0539) BP: (121-133)/(77-82) 123/82 mmHg (09/25 0539) SpO2:  [95 %-98 %] 98 % (09/25 0539) Weight:  [198 lb 13.7 oz (90.2 kg)] 198 lb 13.7 oz (90.2 kg) 2023/09/22 2215) Last BM Date: 2012-09-21   Weights: 24-hour Weight change: 2 lb 6.8 oz (1.1 kg)  Filed Weights   2012-09-21 0157 September 21, 2012 1343 2012-09-21 2215  Weight: 201 lb 4.5 oz (91.3 kg) 203 lb 11.3 oz (92.4 kg) 198 lb 13.7 oz (90.2 kg)    Intake/Output:   Intake/Output Summary (Last 24 hours) at 09/09/12 1420 Last data filed at 2012/09/21 1749  Gross per 24 hour  Intake    120 ml  Output      0 ml  Net    120 ml      Physical Exam: General appearance: alert, cooperative and no distress Resp: clear to auscultation bilaterally Cardio: regular rate and rhythm GI: normal findings: bowel sounds normal, no masses palpable and soft, non-tender and abnormal findings:  Right CVA tenderness    Labs: Basic Metabolic Panel:  Lab [id number] 0630 2012-09-21 0530 09/07/12 1950  NA 135 134* 134*  K 3.8 4.1 5.2*  CL 101 101 103  CO2 16* 14* 17*  GLUCOSE 82 81 92  BUN 53* 61* 62*  CREATININE 6.51* 6.60* 6.86*  CALCIUM 8.5 8.3* 8.6  MG 2.3 -- 2.6*  PHOS 7.5* 6.4* --   Liver Function Tests:  Lab 09/09/12 0630 2012-09-21 0530  AST 12 --  ALT 11 --  ALKPHOS 71 --  BILITOT 0.1* --  PROT 7.3 --  ALBUMIN 3.0*3.0* 3.0*   CBC:  Lab 09/09/12 0630 2012/09/21 0530 09/07/12 1950  WBC 5.5 5.8 6.2  NEUTROABS -- -- 3.3  HGB 9.3* 9.1* 9.8*  HCT 28.3* 28.2* 30.0*  MCV 87.1 86.8 87.2  PLT 259 296 279   Coagulation Studies:  Basename 09/09/12 0900 September 21, 2012 0530 09/07/12 1513  LABPROT 25.0* 24.3* --    INR 2.39* 2.30* 2.30   Microbiology: Results for orders placed during the hospital encounter of 09/07/12  URINE CULTURE     Status: Normal   Collection Time   09/07/12  9:38 PM      Component Value Range Status Comment   Specimen Description URINE, CLEAN CATCH   Final    Special Requests NONE   Final    Culture  Setup Time 09/07/2012 22:55   Final    Colony Count NO GROWTH   Final    Culture NO GROWTH   Final    Report Status 09/09/2012 FINAL   Final   CULTURE, BLOOD (ROUTINE X 2)     Status: Normal (Preliminary result)   Collection Time   09/07/12 11:51 PM      Component Value Range Status Comment   Specimen Description BLOOD RIGHT ARM   Final    Special Requests BOTTLES DRAWN AEROBIC ONLY [place]   Final    Culture  Setup Time Sep 21, 2012 06:09   Final    Culture     Final    Value:  BLOOD CULTURE RECEIVED NO GROWTH TO DATE CULTURE WILL BE HELD FOR 5 DAYS BEFORE ISSUING A FINAL NEGATIVE REPORT   Report Status PENDING   Incomplete   CULTURE, BLOOD (ROUTINE X 2)     Status: Normal (Preliminary result)   Collection Time   09/07/12 11:55 PM      Component Value Range Status Comment   Specimen Description BLOOD LEFT WRIST   Final    Special Requests BOTTLES DRAWN AEROBIC AND ANAEROBIC 5CC EA   Final    Culture  Setup Time 09/08/2012 06:09   Final    Culture     Final    Value:        BLOOD CULTURE RECEIVED NO GROWTH TO DATE CULTURE WILL BE HELD FOR 5 DAYS BEFORE ISSUING A FINAL NEGATIVE REPORT   Report Status PENDING   Incomplete   MRSA PCR SCREENING     Status: Abnormal   Collection Time   09/08/12  2:31 AM      Component Value Range Status Comment   MRSA by PCR POSITIVE (*) NEGATIVE Final      Medications:    Infusions:     Scheduled Medications:    . amLODipine  10 mg Oral QHS  . calcium acetate  667 mg Oral TID WC  . Chlorhexidine Gluconate Cloth  6 each Topical Q0600  . ciprofloxacin  500 mg Oral QHS  . efavirenz  600 mg Oral QHS  . kidney failure book   Does  not apply Once  . lamiVUDine  100 mg Oral Daily  . mirtazapine  15 mg Oral QHS  . mupirocin ointment  1 application Nasal BID  . nicotine  14 mg Transdermal Once  . nicotine  14 mg Transdermal Daily  . sodium bicarbonate  1,300 mg Oral TID  . warfarin  5 mg Oral Custom  . warfarin  7.5 mg Oral Custom  . Warfarin - Physician Dosing Inpatient   Does not apply q1800  . zidovudine  300 mg Oral BID  . DISCONTD: ciprofloxacin  400 mg Intravenous QHS     PRN Medications: acetaminophen, acetaminophen, morphine injection, oxyCODONE, oxyCODONE-acetaminophen, promethazine   Assessment/ Plan:    1.   Acute complicated pyelonephritis:  Admission urinalysis was positive for nitrates and had large leukocyte esterase.  There were many bacteria and many leukocytes.  His urine culture has subsequently been negative, but we will treat for 14 days total for acute complicated pyelonephritis.  GC and chlamydia urine probe is still pending. - Ciprofloxacin 500mg  BID for 11 more days (14 days total) - GC and chlamydia urine probe pending  2.   ESRD:  Creatinine is 6.51 today; GFR is 10.  Bicarbonate remains low at 16 despite oral sodium bicarbonate.  The potassium has corrected and is 3.8.  Phosphorus remains elevated at 7.5, but calcium is normal at 8.5.  Patient continues to want to postpone nephrectomy and dialysis.  He follows up with Dr. Moshe Cipro on Friday. - Continue oral sodium bicarbonate, 1300mg  TID  3.   HIV:  CD4 count was 840 in August.  Recently switched from Truvada to lamivudine and zidovudine by Dr. Linus Salmons. - Continue efavirenz 600mg  qHS - Continue lamivudine 100mg  daily - Continue zidovudine 300mg  BID  4.   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  INR is 2.39.  Continue current home warfarin regimen and follow up with Coumadin clinic as scheduled.  5.   Hypertension:  Blood pressures well controlled during hospitalization.  Continue amlodipine 10mg  daily.  6.    Disposition:  Discharge home   Length of Stay: 2 days   Signed by:  Shann Medal. Juleen China, MD PGY-I, Internal Medicine Pager [phone]  09/09/2012, 2:20 PM

## 2012-09-09 NOTE — Progress Notes (Signed)
PHARMACIST - PHYSICIAN COMMUNICATION DR:  [name] CONCERNING: Pharmacy Care Issues Regarding Warfarin Labs  RECOMMENDATION (Action Taken): A baseline and daily protime for three days has been ordered to meet the [facility] Patient safety goal and comply with the current [place].   The Pharmacy will defer all warfarin dose order changes and follow up of lab results to the prescriber unless an additional order to initiate a "pharmacy Coumadin consult" is placed.  DESCRIPTION:  While hospitalized, to be in compliance with [facility] Patient Safety Goals, all patients on warfarin must have a baseline and/or current protime prior to the administration of warfarin. Pharmacy has received your order for warfarin without these required laboratory assessments.   Alycia Rossetti, PharmD, BCPS 09/09/2012 8:20 AM

## 2012-09-10 ENCOUNTER — Telehealth: Payer: Self-pay | Admitting: Internal Medicine

## 2012-09-10 MED ORDER — CIPROFLOXACIN HCL 500 MG PO TABS: 500.0000 mg | ORAL_TABLET | Freq: Every day | ORAL | Status: AC

## 2012-09-10 NOTE — Telephone Encounter (Signed)
Called patient and instructed him to take 1 500mg  ciprofloxacin tablet ONCE a day rather than twice.  Instructed him to take for 11 days.

## 2012-09-10 NOTE — Progress Notes (Signed)
Retro ur ins review.

## 2012-09-10 NOTE — Discharge Summary (Signed)
Update to above -   GC/Chlamydia urine probe negative.   Signed  [name], EMILY

## 2012-09-11 ENCOUNTER — Ambulatory Visit (INDEPENDENT_AMBULATORY_CARE_PROVIDER_SITE_OTHER): Payer: Medicaid Other | Admitting: Internal Medicine

## 2012-09-11 ENCOUNTER — Encounter: Payer: Self-pay | Admitting: Internal Medicine

## 2012-09-11 VITALS — BP 143/89 | HR 95 | Temp 97.8°F | Ht 76.0 in | Wt 206.2 lb

## 2012-09-11 DIAGNOSIS — N10 Acute tubulo-interstitial nephritis: Secondary | ICD-10-CM

## 2012-09-11 DIAGNOSIS — N390 Urinary tract infection, site not specified: Secondary | ICD-10-CM

## 2012-09-11 MED ORDER — OXYCODONE-ACETAMINOPHEN 10-325 MG PO TABS: 1.0000 | ORAL_TABLET | Freq: Three times a day (TID) | ORAL | Status: DC | PRN
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
## 2012-09-14 LAB — CULTURE, BLOOD (ROUTINE X 2): Culture: NO GROWTH

## 2012-09-14 NOTE — Progress Notes (Signed)
INTERNAL MEDICINE TEACHING ATTENDING ADDENDUM - Dominic Pea, DO: I personally saw and evaluated Brian [name] in this clinic visit in conjunction with the resident, Dr. Algis Liming. I have discussed patient's plan of care with medical resident during this visit. I have confirmed the physical exam findings and have read and agree with the clinic note including the plan.

## 2012-09-15 LAB — HLA B*5701: HLA B 5701: NEGATIVE

## 2012-09-17 ENCOUNTER — Other Ambulatory Visit: Payer: Self-pay | Admitting: Internal Medicine

## 2012-09-17 MED ORDER — AMLODIPINE BESYLATE 10 MG PO TABS: 10.0000 mg | ORAL_TABLET | Freq: Every day | ORAL | Status: DC

## 2012-09-17 MED ORDER — OXYCODONE-ACETAMINOPHEN 10-325 MG PO TABS: 1.0000 | ORAL_TABLET | Freq: Three times a day (TID) | ORAL | Status: DC | PRN

## 2012-09-21 ENCOUNTER — Other Ambulatory Visit: Payer: Medicaid Other

## 2012-09-21 DIAGNOSIS — B20 Human immunodeficiency virus [HIV] disease: Secondary | ICD-10-CM

## 2012-09-22 LAB — CBC WITH DIFFERENTIAL/PLATELET
Basophils Relative: 1 % (ref 0–1)
Eosinophils Relative: 2 % (ref 0–5)
HCT: 29.2 % — ABNORMAL LOW (ref 39.0–52.0)
Hemoglobin: 9.5 g/dL — ABNORMAL LOW (ref 13.0–17.0)
MCH: 27.6 pg (ref 26.0–34.0)
MCHC: 32.5 g/dL (ref 30.0–36.0)
MCV: 84.9 fL (ref 78.0–100.0)
Monocytes Absolute: 0.3 10*3/uL (ref 0.1–1.0)
Monocytes Relative: 7 % (ref 3–12)
Neutro Abs: 2.5 10*3/uL (ref 1.7–7.7)

## 2012-09-22 LAB — COMPLETE METABOLIC PANEL WITH GFR
Albumin: 3.7 g/dL (ref 3.5–5.2)
Alkaline Phosphatase: 55 U/L (ref 39–117)
BUN: 48 mg/dL — ABNORMAL HIGH (ref 6–23)
Calcium: 8.1 mg/dL — ABNORMAL LOW (ref 8.4–10.5)
Creat: 5.58 mg/dL — ABNORMAL HIGH (ref 0.50–1.35)
GFR, Est Non African American: 12 mL/min — ABNORMAL LOW
Glucose, Bld: 91 mg/dL (ref 70–99)
Potassium: 5.5 mEq/L — ABNORMAL HIGH (ref 3.5–5.3)

## 2012-09-22 LAB — T-HELPER CELL (CD4) - (RCID CLINIC ONLY): CD4 T Cell Abs: 770 uL (ref 400–2700)

## 2012-09-28 ENCOUNTER — Ambulatory Visit: Payer: Medicaid Other

## 2012-10-08 ENCOUNTER — Encounter: Payer: Self-pay | Admitting: Internal Medicine

## 2012-10-08 ENCOUNTER — Ambulatory Visit (INDEPENDENT_AMBULATORY_CARE_PROVIDER_SITE_OTHER): Payer: Medicaid Other | Admitting: Internal Medicine

## 2012-10-08 VITALS — BP 165/93 | HR 94 | Temp 97.5°F | Ht 75.0 in | Wt 200.0 lb

## 2012-10-08 DIAGNOSIS — B20 Human immunodeficiency virus [HIV] disease: Secondary | ICD-10-CM

## 2012-10-08 NOTE — Assessment & Plan Note (Signed)
He does take his medicine well and has a continued undetectable virus. If he does start dialysis, I will need to change his regimen. I may consider weekly tenofovir along with the daily lamuvidine and Sustiva.  He knows to call and make an earlier appointment if he does start dialysis soon,otherwise I will see him in 4 months.

## 2012-10-08 NOTE — Progress Notes (Signed)
  Subjective:    Patient ID: Brian [name], male    DOB: 22-Nov-1976, 36 y.o.   MRN: [id number]  HPI He comes in for followup of his HIV. He is on Sustiva with zidovudine twice a day and lamuvidine daily.  He reports good compliance and does have a continued undetectable viral load. His GFR remains minimal and he has been recommended to start dialysis but is resistant to starting at this time.  He does have an appointment with his nephrologist in one week.   Review of Systems  Constitutional: Negative for fever and fatigue.  Respiratory: Negative for cough and shortness of breath.   Genitourinary: Positive for flank pain. Negative for frequency and difficulty urinating.  Musculoskeletal: Negative for myalgias, joint swelling and arthralgias.       Objective:   Physical Exam  Constitutional: He appears well-developed and well-nourished. No distress.  Cardiovascular: Normal rate, regular rhythm and normal heart sounds.  Exam reveals no gallop and no friction rub.   No murmur heard. Pulmonary/Chest: Effort normal and breath sounds normal. No respiratory distress. He has no wheezes. He has no rales.  Abdominal: Soft. Bowel sounds are normal. He exhibits no distension. There is no tenderness. There is no rebound.          Assessment & Plan:

## 2012-10-12 ENCOUNTER — Ambulatory Visit (INDEPENDENT_AMBULATORY_CARE_PROVIDER_SITE_OTHER): Payer: Medicaid Other | Admitting: Pharmacist

## 2012-10-12 DIAGNOSIS — Z7901 Long term (current) use of anticoagulants: Secondary | ICD-10-CM

## 2012-10-12 DIAGNOSIS — Z86718 Personal history of other venous thrombosis and embolism: Secondary | ICD-10-CM

## 2012-10-12 DIAGNOSIS — I2699 Other pulmonary embolism without acute cor pulmonale: Secondary | ICD-10-CM

## 2012-10-12 NOTE — Progress Notes (Signed)
Anti-Coagulation Progress Note  Brian [name] is a 36 y.o. male who is currently on an anti-coagulation regimen.    RECENT RESULTS: Recent results are below, the most recent result is correlated with a dose of 42.5 mg. per week: Lab Results  Component Value Date   INR 3.90 10/12/2012   INR 2.39* 09/09/2012   INR 2.30* 09/08/2012    ANTI-COAG DOSE:   Latest dosing instructions   Total Sun Mon Tue Wed Thu Fri Sat   35 5 mg 5 mg 5 mg 5 mg 5 mg 5 mg 5 mg    (5 mg1) (5 mg1) (5 mg1) (5 mg1) (5 mg1) (5 mg1) (5 mg1)         ANTICOAG SUMMARY: Anticoagulation Episode Summary              Current INR goal 2.0-3.0 Next INR check 11/02/2012   INR from last check 3.90! (10/12/2012)     Weekly max dose (mg)  Target end date Indefinite   Indications History of DVT (deep vein thrombosis) (Resolved), Long term (current) use of anticoagulants   [facility] check [facility]    Send INR reminders to    Comments             ANTICOAG TODAY: Anticoagulation Summary as of 10/12/2012              INR goal 2.0-3.0     Selected INR 3.90! (10/12/2012) Next INR check 11/02/2012   Weekly max dose (mg)  Target end date Indefinite   Indications History of DVT (deep vein thrombosis) (Resolved), Long term (current) use of anticoagulants    Anticoagulation Episode Summary              IN[facility] check [facility]erred lab    Send INR reminders to    Comments             PATIENT INSTRUCTIONS: Patient Instructions  Patient instructed to take medications as defined in the Anti-coagulation Track section of this encounter.  Patient instructed to take today's dose.  Patient verbalized understanding of these instructions.        FOLLOW-UP Return in 3 weeks (on 11/02/2012) for Follow up INR at 1400h.  Jorene Guest, III Pharm.D., CACP

## 2012-10-12 NOTE — Patient Instructions (Signed)
Patient instructed to take medications as defined in the Anti-coagulation Track section of this encounter.  Patient instructed to take today's dose.  Patient verbalized understanding of these instructions.    

## 2012-10-14 ENCOUNTER — Other Ambulatory Visit: Payer: Self-pay | Admitting: *Deleted

## 2012-10-14 NOTE — Telephone Encounter (Signed)
Pt has 5 pills left - needs #90 - pt states has always gotten 90.

## 2012-10-15 MED ORDER — OXYCODONE-ACETAMINOPHEN 10-325 MG PO TABS: 1.0000 | ORAL_TABLET | Freq: Three times a day (TID) | ORAL | Status: DC | PRN

## 2012-10-22 ENCOUNTER — Telehealth: Payer: Self-pay | Admitting: Infectious Disease

## 2012-10-22 DIAGNOSIS — B20 Human immunodeficiency virus [HIV] disease: Secondary | ICD-10-CM

## 2012-10-22 MED ORDER — ZIDOVUDINE 300 MG PO TABS: 300.0000 mg | ORAL_TABLET | Freq: Two times a day (BID) | ORAL | Status: DC

## 2012-10-22 MED ORDER — LAMIVUDINE 100 MG PO TABS: 100.0000 mg | ORAL_TABLET | Freq: Every day | ORAL | Status: DC

## 2012-10-22 MED ORDER — EFAVIRENZ 600 MG PO TABS: 600.0000 mg | ORAL_TABLET | Freq: Every day | ORAL | Status: DC

## 2012-10-22 NOTE — Telephone Encounter (Signed)
Received phone call from pt (paged by opoerator to his phone directly) he  had asked [facility] for refills [facility] 2 days ago. Had not yet received word and only had 2 pills left.  Sent refils of sustiva, AZT lamivudine with 11 refills to [facility] on [address]

## 2012-10-23 ENCOUNTER — Other Ambulatory Visit: Payer: Self-pay | Admitting: *Deleted

## 2012-10-23 DIAGNOSIS — B20 Human immunodeficiency virus [HIV] disease: Secondary | ICD-10-CM

## 2012-10-23 MED ORDER — EFAVIRENZ 600 MG PO TABS: 600.0000 mg | ORAL_TABLET | Freq: Every day | ORAL | Status: DC

## 2012-10-23 NOTE — Telephone Encounter (Signed)
The Sustiva was actually set to phone in, so I recent it electronically. Brian [name]

## 2012-10-23 NOTE — Telephone Encounter (Signed)
GOod thing is he is COMPUlSIVE about making sure meds are filled

## 2012-11-02 ENCOUNTER — Ambulatory Visit (INDEPENDENT_AMBULATORY_CARE_PROVIDER_SITE_OTHER): Payer: Medicaid Other | Admitting: Pharmacist

## 2012-11-02 DIAGNOSIS — I2699 Other pulmonary embolism without acute cor pulmonale: Secondary | ICD-10-CM

## 2012-11-02 DIAGNOSIS — Z86718 Personal history of other venous thrombosis and embolism: Secondary | ICD-10-CM

## 2012-11-02 DIAGNOSIS — Z7901 Long term (current) use of anticoagulants: Secondary | ICD-10-CM

## 2012-11-02 LAB — POCT INR: INR: 1.5

## 2012-11-02 MED ORDER — WARFARIN SODIUM 5 MG PO TABS: ORAL_TABLET | ORAL | Status: DC

## 2012-11-02 NOTE — Patient Instructions (Signed)
Patient instructed to take medications as defined in the Anti-coagulation Track section of this encounter.  Patient instructed to take today's dose.  Patient verbalized understanding of these instructions.    

## 2012-11-02 NOTE — Progress Notes (Signed)
Anti-Coagulation Progress Note  Brian [name] is a 36 y.o. male who is currently on an anti-coagulation regimen.    RECENT RESULTS: Recent results are below, the most recent result is correlated with a dose of 35 mg. per week: Lab Results  Component Value Date   INR 1.50 11/02/2012   INR 3.90 10/12/2012   INR 2.39* 09/09/2012    ANTI-COAG DOSE:   Latest dosing instructions   Total Sun Mon Tue Wed Thu Fri Sat   40 5 mg 7.5 mg 5 mg 5 mg 7.5 mg 5 mg 5 mg    (5 mg1) (5 mg1.5) (5 mg1) (5 mg1) (5 mg1.5) (5 mg1) (5 mg1)         ANTICOAG SUMMARY: Anticoagulation Episode Summary              Current INR goal 2.0-3.0 Next INR check 11/16/2012   INR from last check 1.50! (11/02/2012)     Weekly max dose (mg)  Target end date Indefinite   Indications History of DVT (deep vein thrombosis) (Resolved) [V12.51], Long term (current) use of anticoagulants [V58.61]   IN[facility] check location [facility]    Send INR reminders to    Comments             ANTICOAG TODAY: Anticoagulation Summary as of 11/02/2012              INR goal 2.0-3.0     Selected INR 1.50! (11/02/2012) Next INR check 11/16/2012   Weekly max dose (mg)  Target end date Indefinite   Indications History of DVT (deep vein thrombosis) (Resolved) [V12.51], Long term (current) use of anticoagulants [V58.61]    Anticoagulation Episode Summary              INR check location [facility] lab    Send INR reminders to    Comments             PATIENT INSTRUCTIONS: Patient Instructions  Patient instructed to take medications as defined in the Anti-coagulation Track section of this encounter.  Patient instructed to take today's dose.  Patient verbalized understanding of these instructions.        FOLLOW-UP Return in 2 weeks (on 11/16/2012) for [place] up INR at 2:15PM.  Jorene Guest, III Pharm.D., CACP

## 2012-11-16 ENCOUNTER — Ambulatory Visit: Payer: Medicaid Other

## 2012-11-16 ENCOUNTER — Other Ambulatory Visit: Payer: Self-pay | Admitting: *Deleted

## 2012-11-16 MED ORDER — OXYCODONE-ACETAMINOPHEN 10-325 MG PO TABS: 1.0000 | ORAL_TABLET | Freq: Three times a day (TID) | ORAL | Status: DC | PRN

## 2012-11-19 ENCOUNTER — Other Ambulatory Visit: Payer: Self-pay | Admitting: Internal Medicine

## 2012-11-19 MED ORDER — OXYCODONE-ACETAMINOPHEN 10-325 MG PO TABS: 1.0000 | ORAL_TABLET | Freq: Three times a day (TID) | ORAL | Status: DC | PRN

## 2012-11-30 ENCOUNTER — Ambulatory Visit (INDEPENDENT_AMBULATORY_CARE_PROVIDER_SITE_OTHER): Payer: Medicaid Other | Admitting: Pharmacist

## 2012-11-30 DIAGNOSIS — I2699 Other pulmonary embolism without acute cor pulmonale: Secondary | ICD-10-CM

## 2012-11-30 DIAGNOSIS — Z86718 Personal history of other venous thrombosis and embolism: Secondary | ICD-10-CM

## 2012-11-30 DIAGNOSIS — Z7901 Long term (current) use of anticoagulants: Secondary | ICD-10-CM

## 2012-11-30 NOTE — Patient Instructions (Signed)
Patient instructed to take medications as defined in the Anti-coagulation Track section of this encounter.  Patient instructed to take today's dose.  Patient verbalized understanding of these instructions.    

## 2012-11-30 NOTE — Progress Notes (Signed)
Anti-Coagulation Progress Note  Brian [name] is a 35 y.o. male who is currently on an anti-coagulation regimen.    RECENT RESULTS: Recent results are below, the most recent result is correlated with a dose of 40 mg. per week: Lab Results  Component Value Date   INR 2.70 11/30/2012   INR 1.50 11/02/2012   INR 3.90 10/12/2012    ANTI-COAG DOSE:   Latest dosing instructions   Total Sun Mon Tue Wed Thu Fri Sat   40 5 mg 7.5 mg 5 mg 5 mg 7.5 mg 5 mg 5 mg    (5 mg1) (5 mg1.5) (5 mg1) (5 mg1) (5 mg1.5) (5 mg1) (5 mg1)         ANTICOAG SUMMARY: Anticoagulation Episode Summary              Current INR goal 2.0-3.0 Next INR check 01/11/2013   INR from last check 2.70 (11/30/2012)     Weekly max dose (mg)  Target end date Indefinite   Indications History of DVT (deep vein thrombosis) (Resolved) [V12.51], Long term (current) use of anticoagulants [V58.61]   INR check location [facility]    Send INR reminders to    Comments             ANTICOAG TODAY: Anticoagulation Summary as of 11/30/2012              INR goal 2.0-3.0     Selected INR 2.70 (11/30/2012) Next INR check 01/11/2013   Weekly max dose (mg)  Target end date Indefinite   Indications History of DVT (deep vein thrombosis) (Resolved) [V12.51], Long term (current) use of anticoagulants [V58.61]    Anticoagulation Episode Summary              INR check location [facility] lab    Send INR reminders to    Comments             PATIENT INSTRUCTIONS: Patient Instructions  Patient instructed to take medications as defined in the Anti-coagulation Track section of this encounter.  Patient instructed to take today's dose.  Patient verbalized understanding of these instructions.        FOLLOW-UP Return in 6 weeks (on 01/11/2013) for Follow up INR at 3:15PM.  Jorene Guest, III Pharm.D., CACP

## 2012-12-17 ENCOUNTER — Encounter: Payer: Self-pay | Admitting: *Deleted

## 2012-12-21 ENCOUNTER — Other Ambulatory Visit: Payer: Self-pay | Admitting: *Deleted

## 2012-12-21 MED ORDER — OXYCODONE-ACETAMINOPHEN 10-325 MG PO TABS: 1.0000 | ORAL_TABLET | Freq: Three times a day (TID) | ORAL | Status: DC | PRN

## 2012-12-21 NOTE — Telephone Encounter (Signed)
Last refill 12/5

## 2012-12-21 NOTE — Telephone Encounter (Signed)
Pt informed Rx is ready 

## 2013-01-06 NOTE — Progress Notes (Signed)
Note reviewed.  Agree with plan by Dr. Elie Confer for anti-coagulation.  I did not personally see the patient.   Signed  [name], EMILY

## 2013-01-11 ENCOUNTER — Ambulatory Visit (INDEPENDENT_AMBULATORY_CARE_PROVIDER_SITE_OTHER): Payer: Medicaid Other | Admitting: Pharmacist

## 2013-01-11 DIAGNOSIS — Z7901 Long term (current) use of anticoagulants: Secondary | ICD-10-CM

## 2013-01-11 DIAGNOSIS — I2699 Other pulmonary embolism without acute cor pulmonale: Secondary | ICD-10-CM

## 2013-01-11 DIAGNOSIS — Z86718 Personal history of other venous thrombosis and embolism: Secondary | ICD-10-CM

## 2013-01-11 NOTE — Patient Instructions (Signed)
Patient instructed to take medications as defined in the Anti-coagulation Track section of this encounter.  Patient instructed to take today's dose.  Patient verbalized understanding of these instructions.    

## 2013-01-11 NOTE — Progress Notes (Signed)
Anti-Coagulation Progress Note  Brian [name] is a 37 y.o. male who is currently on an anti-coagulation regimen.    RECENT RESULTS: Recent results are below, the most recent result is correlated with a dose of 40 mg. per week: Lab Results  Component Value Date   INR 2.00 01/11/2013   INR 2.70 11/30/2012   INR 1.50 11/02/2012    ANTI-COAG DOSE:   Latest dosing instructions   Total Sun Mon Tue Wed Thu Fri Sat   42.5 5 mg 7.5 mg 5 mg 7.5 mg 5 mg 7.5 mg 5 mg    (5 mg1) (5 mg1.5) (5 mg1) (5 mg1.5) (5 mg1) (5 mg1.5) (5 mg1)         ANTICOAG SUMMARY: Anticoagulation Episode Summary              Current INR goal 2.0-3.0 Next INR check 02/08/2013   INR from last check 2.00 (01/11/2013)     Weekly max dose (mg)  Target end date Indefinite   Indications History of DVT (deep vein thrombosis) (Resolved) [V12.51], Long term (current) use of anticoagulants [V58.61]   INR check location [facility]    Send INR reminders to    Comments             ANTICOAG TODAY: Anticoagulation Summary as of 01/11/2013              INR goal 2.0-3.0     Selected INR 2.00 (01/11/2013) Next INR check 02/08/2013   Weekly max dose (mg)  Target end date Indefinite   Indications History of DVT (deep vein thrombosis) (Resolved) [V12.51], Long term (current) use of anticoagulants [V58.61]    Anticoagulation Episode Summary              INR check location [facility]    Send INR reminders to    Comments             PATIENT INSTRUCTIONS: Patient Instructions  Patient instructed to take medications as defined in the Anti-coagulation Track section of this encounter.  Patient instructed to take today's dose.  Patient verbalized understanding of these instructions.        FOLLOW-UP Return in 4 weeks (on 02/08/2013) for Follow up INR at 3:15PM.  Jorene Guest, III Pharm.D., CACP

## 2013-01-22 ENCOUNTER — Other Ambulatory Visit: Payer: Self-pay | Admitting: *Deleted

## 2013-01-22 MED ORDER — OXYCODONE-ACETAMINOPHEN 10-325 MG PO TABS: 1.0000 | ORAL_TABLET | Freq: Three times a day (TID) | ORAL | Status: DC | PRN

## 2013-01-28 ENCOUNTER — Other Ambulatory Visit: Payer: Medicaid Other

## 2013-02-02 ENCOUNTER — Other Ambulatory Visit: Payer: Medicaid Other

## 2013-02-02 DIAGNOSIS — B20 Human immunodeficiency virus [HIV] disease: Secondary | ICD-10-CM

## 2013-02-03 LAB — T-HELPER CELL (CD4) - (RCID CLINIC ONLY): CD4 T Cell Abs: 680 uL (ref 400–2700)

## 2013-02-03 LAB — HIV-1 RNA QUANT-NO REFLEX-BLD: HIV-1 RNA Quant, Log: <1.3 {Log} (ref <1.30)

## 2013-02-08 ENCOUNTER — Ambulatory Visit (INDEPENDENT_AMBULATORY_CARE_PROVIDER_SITE_OTHER): Payer: Medicaid Other | Admitting: Pharmacist

## 2013-02-08 DIAGNOSIS — Z7901 Long term (current) use of anticoagulants: Secondary | ICD-10-CM

## 2013-02-08 MED ORDER — WARFARIN SODIUM 5 MG PO TABS: ORAL_TABLET | ORAL | Status: DC

## 2013-02-08 NOTE — Patient Instructions (Addendum)
Patient instructed to take medications as defined in the Anti-coagulation Track section of this encounter.  Patient instructed to take today's dose.  Patient verbalized understanding of these instructions.    

## 2013-02-08 NOTE — Progress Notes (Signed)
Anti-Coagulation Progress Note  Brian [name] is a 37 y.o. male who is currently on an anti-coagulation regimen.    RECENT RESULTS: Recent results are below, the most recent result is correlated with a dose of 42.5 mg. per week: Lab Results  Component Value Date   INR 2.20 02/08/2013   INR 2.00 01/11/2013   INR 2.70 11/30/2012    ANTI-COAG DOSE: Anticoagulation Dose Instructions as of 02/08/2013     Dorene Grebe Tue Wed Thu Fri Sat   New Dose 5 mg 7.5 mg 5 mg 7.5 mg 5 mg 7.5 mg 5 mg       ANTICOAG SUMMARY: Anticoagulation Episode Summary   Current INR goal 2.0-3.0  Next INR check 03/08/2013  INR from last check 2.20 (02/08/2013)  Weekly max dose   Target end date Indefinite  [facility] location [facility]  Preferred lab   Send INR reminders to    Indications  History of DVT (deep vein thrombosis) (Resolved) [V12.51] Long term (current) use of anticoagulants [V58.61]        Comments         ANTICOAG TODAY: Anticoagulation Summary as of 02/08/2013   INR goal 2.0-3.0  Selected INR 2.20 (02/08/2013)  Next INR check 03/08/2013  Target end date Indefinite   Indications  History of DVT (deep vein thrombosis) (Resolved) [V12.51] Long term (current) use of anticoagulants [V58.61]      Anticoagulation Episode Summary   [facility] location [facility]   Preferred lab    Send INR reminders to    Comments       PATIENT INSTRUCTIONS: Patient Instructions  Patient instructed to take medications as defined in the Anti-coagulation Track section of this encounter.  Patient instructed to take today's dose.  Patient verbalized understanding of these instructions.       FOLLOW-UP Return in 4 weeks (on 03/08/2013) for Follow up INR at 2:45PM.  Jorene Guest, III Pharm.D., CACP

## 2013-02-11 ENCOUNTER — Encounter: Payer: Self-pay | Admitting: Internal Medicine

## 2013-02-11 ENCOUNTER — Ambulatory Visit (INDEPENDENT_AMBULATORY_CARE_PROVIDER_SITE_OTHER): Payer: Medicaid Other | Admitting: Internal Medicine

## 2013-02-11 VITALS — BP 158/95 | HR 101 | Temp 97.6°F | Ht 76.0 in | Wt 196.0 lb

## 2013-02-11 DIAGNOSIS — B20 Human immunodeficiency virus [HIV] disease: Secondary | ICD-10-CM

## 2013-02-11 NOTE — Progress Notes (Signed)
  Subjective:    Patient ID: Brian [name], male    DOB: 04/05/1976, 37 y.o.   MRN: [id number]  HPI  He comes in for followup of his HIV. He is on Sustiva with zidovudine twice a day and lamuvidine daily.  He reports good compliance and does have a continued undetectable viral load. His GFR remains minimal and he has been recommended to start dialysis but is resistant to starting at this time. He continues to see nephrology and has a fistula in place.   Review of Systems  Constitutional: Negative for fever and fatigue.  Respiratory: Negative for cough and shortness of breath.   Genitourinary: Positive for hematuria. Negative for frequency, flank pain and difficulty urinating.  Musculoskeletal: Negative for myalgias, joint swelling and arthralgias.       Objective:   Physical Exam  Constitutional: He appears well-developed and well-nourished. No distress.  Cardiovascular: Normal rate, regular rhythm and normal heart sounds.  Exam reveals no gallop and no friction rub.   No murmur heard. Pulmonary/Chest: Effort normal and breath sounds normal. No respiratory distress. He has no wheezes. He has no rales.  Abdominal: Soft. Bowel sounds are normal. He exhibits no distension. There is no tenderness. There is no rebound.          Assessment & Plan:

## 2013-02-11 NOTE — Assessment & Plan Note (Signed)
No medication changes at this time. He does have renally dosed medications and these will need to be changed once he is on dialysis or if his kidneys continue to deteriorate further. I will check him in about 3 months again.

## 2013-02-22 ENCOUNTER — Other Ambulatory Visit: Payer: Self-pay | Admitting: *Deleted

## 2013-02-25 MED ORDER — OXYCODONE-ACETAMINOPHEN 10-325 MG PO TABS: 1.0000 | ORAL_TABLET | Freq: Three times a day (TID) | ORAL | Status: DC | PRN

## 2013-02-25 NOTE — Telephone Encounter (Signed)
Pt informed Rx is ready 

## 2013-02-25 NOTE — Telephone Encounter (Signed)
LAst seen 08/2012 and then no showed next two appt. Will give 30 day supply and pt needs to make appt before next refill is given.

## 2013-02-25 NOTE — Telephone Encounter (Signed)
Unable to reach Dr Algis Liming, pt calling again for pain med. Last filled 2/7

## 2013-03-08 ENCOUNTER — Ambulatory Visit (INDEPENDENT_AMBULATORY_CARE_PROVIDER_SITE_OTHER): Payer: Medicaid Other | Admitting: Pharmacist

## 2013-03-08 DIAGNOSIS — Z7901 Long term (current) use of anticoagulants: Secondary | ICD-10-CM

## 2013-03-08 LAB — POCT INR: INR: 1.5

## 2013-03-08 NOTE — Patient Instructions (Signed)
Patient instructed to take medications as defined in the Anti-coagulation Track section of this encounter.  Patient instructed to take today's dose.  Patient verbalized understanding of these instructions.    

## 2013-03-08 NOTE — Progress Notes (Signed)
Anti-Coagulation Progress Note  [name] is a 37 y.o. male who is currently on an anti-coagulation regimen.    RECENT RESULTS: Recent results are below, the most recent result is correlated with a dose of 42.5 mg. per week: Lab Results  Component Value Date   INR 1.50 03/08/2013   INR 2.20 02/08/2013   INR 2.00 01/11/2013    ANTI-COAG DOSE: Anticoagulation Dose Instructions as of 03/08/2013     Dorene Grebe Tue Wed Thu Fri Sat   New Dose 5 mg 7.5 mg 7.5 mg 7.5 mg 7.5 mg 7.5 mg 7.5 mg       ANTICOAG SUMMARY: Anticoagulation Episode Summary   Current INR goal 2.0-3.0  Next INR check 03/29/2013  INR from last check 1.50! (03/08/2013)  Weekly max dose   Target end date Indefinite  [facility]  Preferred lab   Send INR reminders to    Indications  History of DVT (deep vein thrombosis) (Resolved) [V12.51] Long term (current) use of anticoagulants [V58.61]        Comments         ANTICOAG TODAY: Anticoagulation Summary as of 03/08/2013   INR goal 2.0-3.0  Selected INR 1.50! (03/08/2013)  Next INR check 03/29/2013  Target end date Indefinite   Indications  History of DVT (deep vein thrombosis) (Resolved) [V12.51] Long term (current) use of anticoagulants [V58.61]      Anticoagulation Episode Summary   [facility] check location [facility]   Preferred lab    Send INR reminders to    Comments       PATIENT INSTRUCTIONS: Patient Instructions  Patient instructed to take medications as defined in the Anti-coagulation Track section of this encounter.  Patient instructed to take today's dose.  Patient verbalized understanding of these instructions.       FOLLOW-UP Return in 3 weeks (on 03/29/2013) for Follow up INR at 2:45PM.  Jorene Guest, III Pharm.D., CACP

## 2013-03-18 NOTE — Progress Notes (Signed)
I have reviewed Dr. Gladstone Pih note.  I agree with plan as documented.

## 2013-03-25 ENCOUNTER — Other Ambulatory Visit (HOSPITAL_COMMUNITY): Payer: Self-pay | Admitting: *Deleted

## 2013-03-26 ENCOUNTER — Ambulatory Visit (INDEPENDENT_AMBULATORY_CARE_PROVIDER_SITE_OTHER): Payer: Medicaid Other | Admitting: Internal Medicine

## 2013-03-26 ENCOUNTER — Encounter (HOSPITAL_COMMUNITY)
Admission: RE | Admit: 2013-03-26 | Discharge: 2013-03-26 | Disposition: A | Discharge status: Home / Self Care | Payer: Medicaid Other | Source: Ambulatory Visit | Attending: Nephrology | Admitting: Nephrology

## 2013-03-26 ENCOUNTER — Encounter: Payer: Self-pay | Admitting: Internal Medicine

## 2013-03-26 VITALS — BP 145/91 | HR 107 | Temp 97.1°F | Ht 75.0 in | Wt 199.4 lb

## 2013-03-26 DIAGNOSIS — Q613 Polycystic kidney, unspecified: Secondary | ICD-10-CM

## 2013-03-26 DIAGNOSIS — Z21 Asymptomatic human immunodeficiency virus [HIV] infection status: Secondary | ICD-10-CM | POA: Insufficient documentation

## 2013-03-26 DIAGNOSIS — N186 End stage renal disease: Secondary | ICD-10-CM | POA: Insufficient documentation

## 2013-03-26 DIAGNOSIS — D638 Anemia in other chronic diseases classified elsewhere: Secondary | ICD-10-CM | POA: Insufficient documentation

## 2013-03-26 DIAGNOSIS — K469 Unspecified abdominal hernia without obstruction or gangrene: Secondary | ICD-10-CM

## 2013-03-26 MED ORDER — FERUMOXYTOL INJECTION 510 MG/17 ML
INTRAVENOUS | Status: AC
  Administered 2013-03-26: 510 mg
  Filled 2013-03-26: qty 17

## 2013-03-26 MED ORDER — OXYCODONE-ACETAMINOPHEN 10-325 MG PO TABS: 1.0000 | ORAL_TABLET | Freq: Three times a day (TID) | ORAL | Status: DC | PRN

## 2013-03-26 MED ORDER — SODIUM CHLORIDE 0.9 % IV SOLN: INTRAVENOUS | Status: DC

## 2013-03-26 MED ORDER — FERUMOXYTOL INJECTION 510 MG/17 ML: 510.0000 mg | INTRAVENOUS | Status: DC

## 2013-03-26 NOTE — Assessment & Plan Note (Addendum)
Assessment: Follows with [facility] regularly. He currently denies any new urinary symptoms. He is on chronic pain medication for his pain. He is requesting refills.  Plan:      Will refill Percocet as per pain contract.  Check UDS - last Percocet was this AM.

## 2013-03-26 NOTE — Assessment & Plan Note (Addendum)
Assessment: Physical Exam findings are concerning for abdominal wall defect and hernia. This area is reducible in nature. He is not having severe pain, blood in stools to suggest strangulation.  Plan:      Will refer to general surgery for continued evaluation. The patient was informed of the red flag symptoms that should prompt his immediate return for reevaluation - such as, but not limited to the following: worsening abdominal pain, persistent nausea or vomiting, constipation, fevers, generalized malaise. He verbally expresses understanding of the information provided.
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
## 2013-03-26 NOTE — Patient Instructions (Signed)
General Instructions:  Please follow-up at the clinic in 1 month with your PCP, at which time we will reevaluate your pain, abdominal discomfort - OR, please follow-up in the clinic sooner if needed.  There have not been changes in your medications.    If you have been started on new medication(s), and you develop symptoms concerning for allergic reaction, including, but not limited to, throat closing, tongue swelling, rash, please stop the medication immediately and call the clinic at [phone], and go to the ER.  If you are diabetic, please bring your meter to your next visit.  If symptoms worsen, or new symptoms arise, please call the clinic or go to the ER.  PLEASE BRING ALL OF YOUR MEDICATIONS  IN A BAG TO YOUR NEXT APPOINTMENT

## 2013-03-27 LAB — PRESCRIPTION ABUSE MONITORING 15P, URINE
Benzodiazepine Screen, Urine: NEGATIVE ng/mL
Carisoprodol, Urine: NEGATIVE ng/mL
Cocaine Metabolites: NEGATIVE ng/mL
Opiate Screen, Urine: NEGATIVE ng/mL
Tramadol Scrn, Ur: NEGATIVE ng/mL
Zolpidem, Urine: NEGATIVE ng/mL

## 2013-03-29 ENCOUNTER — Ambulatory Visit (INDEPENDENT_AMBULATORY_CARE_PROVIDER_SITE_OTHER): Payer: Self-pay | Admitting: General Surgery

## 2013-03-29 ENCOUNTER — Ambulatory Visit: Payer: Medicaid Other

## 2013-03-29 NOTE — Progress Notes (Signed)
Case discussed with Dr. Guy Sandifer at time of visit. We reviewed the resident's history and exam and pertinent patient test results. I agree with the assessment, diagnosis, and plan of care documented in the resident's note.

## 2013-03-30 LAB — OXYCODONE, URINE (LC/MS-MS): Oxymorphone: 340 ng/mL

## 2013-03-30 NOTE — Progress Notes (Signed)
Quick Note:  UDS appropriate. ______

## 2013-04-02 ENCOUNTER — Encounter (HOSPITAL_COMMUNITY)
Admission: RE | Admit: 2013-04-02 | Discharge: 2013-04-02 | Disposition: A | Discharge status: Home / Self Care | Payer: Medicaid Other | Source: Ambulatory Visit | Attending: Nephrology | Admitting: Nephrology

## 2013-04-02 MED ORDER — FERUMOXYTOL INJECTION 510 MG/17 ML
510.0000 mg | INTRAVENOUS | Status: AC
  Administered 2013-04-02: 510 mg via INTRAVENOUS

## 2013-04-02 MED ORDER — SODIUM CHLORIDE 0.9 % IV SOLN
INTRAVENOUS | Status: AC
  Administered 2013-04-02: 14:00:00 via INTRAVENOUS

## 2013-04-14 NOTE — Addendum Note (Signed)
Addended by: Hulan Fray on: 04/14/2013 07:49 AM   Modules accepted: Orders

## 2013-04-19 ENCOUNTER — Encounter (INDEPENDENT_AMBULATORY_CARE_PROVIDER_SITE_OTHER): Payer: Self-pay | Admitting: General Surgery

## 2013-04-29 ENCOUNTER — Other Ambulatory Visit: Payer: Self-pay | Admitting: *Deleted

## 2013-04-29 ENCOUNTER — Other Ambulatory Visit: Payer: Medicaid Other

## 2013-04-29 NOTE — Telephone Encounter (Signed)
Last refill 4/11 Call when ready @ [phone]

## 2013-04-30 ENCOUNTER — Other Ambulatory Visit: Payer: Self-pay | Admitting: Internal Medicine

## 2013-04-30 MED ORDER — OXYCODONE-ACETAMINOPHEN 10-325 MG PO TABS: 1.0000 | ORAL_TABLET | Freq: Three times a day (TID) | ORAL | Status: DC | PRN

## 2013-04-30 NOTE — Telephone Encounter (Signed)
Pt informed Rx is ready 

## 2013-05-03 ENCOUNTER — Ambulatory Visit (INDEPENDENT_AMBULATORY_CARE_PROVIDER_SITE_OTHER): Payer: Medicaid Other | Admitting: Pharmacist

## 2013-05-03 DIAGNOSIS — Z7901 Long term (current) use of anticoagulants: Secondary | ICD-10-CM

## 2013-05-03 NOTE — Patient Instructions (Signed)
Patient instructed to take medications as defined in the Anti-coagulation Track section of this encounter.  Patient instructed to take today's dose.  Patient verbalized understanding of these instructions.    

## 2013-05-03 NOTE — Progress Notes (Signed)
Anti-Coagulation Progress Note  Brian [name] is a 37 y.o. male who is currently on an anti-coagulation regimen.    RECENT RESULTS: Recent results are below, the most recent result is correlated with a dose of 47.5 mg. per week: Lab Results  Component Value Date   INR 3.00 05/03/2013   INR 1.50 03/08/2013   INR 2.20 02/08/2013    ANTI-COAG DOSE: Anticoagulation Dose Instructions as of 05/03/2013     Dorene Grebe Tue Wed Thu Fri Sat   New Dose 7.5 mg 5 mg 7.5 mg 5 mg 7.5 mg 5 mg 7.5 mg       ANTICOAG SUMMARY: Anticoagulation Episode Summary   Current INR goal 2.0-3.0  Next INR check 05/31/2013  INR from last check 3.00 (05/03/2013)  Weekly max dose   Target end date Indefinite  [facility]  Preferred lab   Send INR reminders to    Indications  History of DVT (deep vein thrombosis) (Resolved) [V12.51] Long term (current) use of anticoagulants [V58.61]        Comments         ANTICOAG TODAY: Anticoagulation Summary as of 05/03/2013   INR goal 2.0-3.0  Selected INR 3.00 (05/03/2013)  Next INR check 05/31/2013  Target end date Indefinite   Indications  History of DVT (deep vein thrombosis) (Resolved) [V12.51] Long term (current) use of anticoagulants [V58.61]      Anticoagulation Episode Summary   [facility] check location [facility]   Preferred lab    Send INR reminders to    Comments       PATIENT INSTRUCTIONS: Patient Instructions  Patient instructed to take medications as defined in the Anti-coagulation Track section of this encounter.  Patient instructed to take today's dose.  Patient verbalized understanding of these instructions.       FOLLOW-UP Return in 4 weeks (on 05/31/2013) for Follow up INR at 2:45PM.  Jorene Guest, III Pharm.D., CACP

## 2013-05-07 ENCOUNTER — Encounter: Payer: Self-pay | Admitting: Internal Medicine

## 2013-05-07 ENCOUNTER — Ambulatory Visit (INDEPENDENT_AMBULATORY_CARE_PROVIDER_SITE_OTHER): Payer: Medicaid Other | Admitting: Internal Medicine

## 2013-05-07 VITALS — BP 141/84 | HR 97 | Temp 97.6°F | Ht 75.0 in | Wt 194.3 lb

## 2013-05-07 DIAGNOSIS — H60399 Other infective otitis externa, unspecified ear: Secondary | ICD-10-CM

## 2013-05-07 DIAGNOSIS — Q612 Polycystic kidney, adult type: Secondary | ICD-10-CM

## 2013-05-07 DIAGNOSIS — H60392 Other infective otitis externa, left ear: Secondary | ICD-10-CM

## 2013-05-07 DIAGNOSIS — K429 Umbilical hernia without obstruction or gangrene: Secondary | ICD-10-CM

## 2013-05-07 DIAGNOSIS — G2581 Restless legs syndrome: Secondary | ICD-10-CM

## 2013-05-07 MED ORDER — CIPROFLOXACIN-DEXAMETHASONE 0.3-0.1 % OT SUSP: 4.0000 [drp] | Freq: Two times a day (BID) | OTIC | Status: DC

## 2013-05-07 MED ORDER — ALBUTEROL SULFATE HFA 108 (90 BASE) MCG/ACT IN AERS: 2.0000 | INHALATION_SPRAY | Freq: Four times a day (QID) | RESPIRATORY_TRACT | Status: DC | PRN

## 2013-05-07 MED ORDER — PRAMIPEXOLE DIHYDROCHLORIDE 0.25 MG PO TABS: 0.2500 mg | ORAL_TABLET | Freq: Every day | ORAL | Status: DC
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
## 2013-05-13 ENCOUNTER — Ambulatory Visit: Payer: Medicaid Other | Admitting: Internal Medicine

## 2013-05-13 ENCOUNTER — Telehealth: Payer: Self-pay | Admitting: *Deleted

## 2013-05-13 NOTE — Telephone Encounter (Signed)
Called and left patient a voice mail to call the clinic to reschedule his lab and MD visit, he no showed both. Myrtis Hopping

## 2013-05-19 ENCOUNTER — Emergency Department (HOSPITAL_COMMUNITY)
Admission: EM | Admit: 2013-05-19 | Discharge: 2013-05-19 | Disposition: A | Discharge status: Home / Self Care | Payer: Medicaid Other | Attending: Emergency Medicine | Admitting: Emergency Medicine

## 2013-05-19 ENCOUNTER — Encounter (HOSPITAL_COMMUNITY): Payer: Self-pay | Admitting: Emergency Medicine

## 2013-05-19 ENCOUNTER — Other Ambulatory Visit: Payer: Medicaid Other

## 2013-05-19 ENCOUNTER — Other Ambulatory Visit: Payer: Self-pay | Admitting: Infectious Disease

## 2013-05-19 ENCOUNTER — Emergency Department (HOSPITAL_COMMUNITY): Payer: Medicaid Other

## 2013-05-19 DIAGNOSIS — Z86718 Personal history of other venous thrombosis and embolism: Secondary | ICD-10-CM | POA: Insufficient documentation

## 2013-05-19 DIAGNOSIS — B20 Human immunodeficiency virus [HIV] disease: Secondary | ICD-10-CM

## 2013-05-19 DIAGNOSIS — Z8669 Personal history of other diseases of the nervous system and sense organs: Secondary | ICD-10-CM | POA: Insufficient documentation

## 2013-05-19 DIAGNOSIS — Z21 Asymptomatic human immunodeficiency virus [HIV] infection status: Secondary | ICD-10-CM | POA: Insufficient documentation

## 2013-05-19 DIAGNOSIS — I120 Hypertensive chronic kidney disease with stage 5 chronic kidney disease or end stage renal disease: Secondary | ICD-10-CM | POA: Insufficient documentation

## 2013-05-19 DIAGNOSIS — Z79899 Other long term (current) drug therapy: Secondary | ICD-10-CM | POA: Insufficient documentation

## 2013-05-19 DIAGNOSIS — Z8709 Personal history of other diseases of the respiratory system: Secondary | ICD-10-CM | POA: Insufficient documentation

## 2013-05-19 DIAGNOSIS — Z862 Personal history of diseases of the blood and blood-forming organs and certain disorders involving the immune mechanism: Secondary | ICD-10-CM | POA: Insufficient documentation

## 2013-05-19 DIAGNOSIS — Z8659 Personal history of other mental and behavioral disorders: Secondary | ICD-10-CM | POA: Insufficient documentation

## 2013-05-19 DIAGNOSIS — R109 Unspecified abdominal pain: Secondary | ICD-10-CM | POA: Insufficient documentation

## 2013-05-19 DIAGNOSIS — N186 End stage renal disease: Secondary | ICD-10-CM | POA: Insufficient documentation

## 2013-05-19 DIAGNOSIS — Z7901 Long term (current) use of anticoagulants: Secondary | ICD-10-CM | POA: Insufficient documentation

## 2013-05-19 DIAGNOSIS — E079 Disorder of thyroid, unspecified: Secondary | ICD-10-CM | POA: Insufficient documentation

## 2013-05-19 DIAGNOSIS — Z8673 Personal history of transient ischemic attack (TIA), and cerebral infarction without residual deficits: Secondary | ICD-10-CM | POA: Insufficient documentation

## 2013-05-19 DIAGNOSIS — F1010 Alcohol abuse, uncomplicated: Secondary | ICD-10-CM | POA: Insufficient documentation

## 2013-05-19 DIAGNOSIS — Z8701 Personal history of pneumonia (recurrent): Secondary | ICD-10-CM | POA: Insufficient documentation

## 2013-05-19 DIAGNOSIS — Q613 Polycystic kidney, unspecified: Secondary | ICD-10-CM | POA: Insufficient documentation

## 2013-05-19 DIAGNOSIS — Z8719 Personal history of other diseases of the digestive system: Secondary | ICD-10-CM | POA: Insufficient documentation

## 2013-05-19 DIAGNOSIS — Z8739 Personal history of other diseases of the musculoskeletal system and connective tissue: Secondary | ICD-10-CM | POA: Insufficient documentation

## 2013-05-19 DIAGNOSIS — F17200 Nicotine dependence, unspecified, uncomplicated: Secondary | ICD-10-CM | POA: Insufficient documentation

## 2013-05-19 DIAGNOSIS — N390 Urinary tract infection, site not specified: Secondary | ICD-10-CM | POA: Insufficient documentation

## 2013-05-19 LAB — CBC WITH DIFFERENTIAL/PLATELET
Basophils Absolute: 0 10*3/uL (ref 0.0–0.1)
Lymphocytes Relative: 24 % (ref 12–46)
Monocytes Relative: 8 % (ref 3–12)
Platelets: 158 10*3/uL (ref 150–400)
RDW: 13.1 % (ref 11.5–15.5)
WBC: 6.6 10*3/uL (ref 4.0–10.5)

## 2013-05-19 LAB — URINALYSIS, ROUTINE W REFLEX MICROSCOPIC
Bilirubin Urine: NEGATIVE
Glucose, UA: NEGATIVE mg/dL
Ketones, ur: 15 mg/dL — AB
pH: 5.5 (ref 5.0–8.0)

## 2013-05-19 LAB — COMPREHENSIVE METABOLIC PANEL
AST: 13 U/L (ref 0–37)
Albumin: 3.9 g/dL (ref 3.5–5.2)
Alkaline Phosphatase: 53 U/L (ref 39–117)
Chloride: 99 mEq/L (ref 96–112)
Potassium: 4.4 mEq/L (ref 3.5–5.1)
Total Bilirubin: 0.3 mg/dL (ref 0.3–1.2)

## 2013-05-19 LAB — URINE MICROSCOPIC-ADD ON

## 2013-05-19 MED ORDER — CIPROFLOXACIN HCL 250 MG PO TABS: 250.0000 mg | ORAL_TABLET | Freq: Two times a day (BID) | ORAL | Status: DC

## 2013-05-19 MED ORDER — SODIUM BICARBONATE 650 MG PO TABS: 650.0000 mg | ORAL_TABLET | Freq: Three times a day (TID) | ORAL | Status: DC

## 2013-05-19 MED ORDER — HYDROMORPHONE HCL PF 2 MG/ML IJ SOLN
2.0000 mg | Freq: Once | INTRAMUSCULAR | Status: AC
  Administered 2013-05-19: 2 mg via INTRAMUSCULAR
  Filled 2013-05-19: qty 1

## 2013-05-19 NOTE — ED Notes (Signed)
Pt drinking vit water at triage (orange0 told not to have anything to else to eat or drink till seen by dr

## 2013-05-19 NOTE — ED Notes (Signed)
Rt flank pain started to get bad  About 30 hours ago  States  Needs diaysis but does not like needles has shunt that they have not used   In rt arm

## 2013-05-19 NOTE — ED Notes (Signed)
Patient transported to CT 

## 2013-05-19 NOTE — ED Notes (Signed)
Delo MD at bedside.

## 2013-05-19 NOTE — ED Notes (Signed)
Reports onset right flank pain x 30 hrs. +nausea, no emesis. Denies urinary frequency, dysuria, hematuria. States has poly cystic kidney disease

## 2013-05-19 NOTE — ED Notes (Signed)
Pt states understanding of discharge instructions 
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
## 2013-05-20 LAB — URINE CULTURE

## 2013-05-20 LAB — T-HELPER CELL (CD4) - (RCID CLINIC ONLY): CD4 % Helper T Cell: 37 % (ref 33–55)

## 2013-05-20 LAB — HIV-1 RNA QUANT-NO REFLEX-BLD
HIV 1 RNA Quant: <20 copies/mL (ref <20)
HIV-1 RNA Quant, Log: <1.3 {Log} (ref <1.30)

## 2013-05-31 ENCOUNTER — Encounter: Payer: Self-pay | Admitting: Internal Medicine

## 2013-05-31 ENCOUNTER — Other Ambulatory Visit: Payer: Self-pay | Admitting: *Deleted

## 2013-05-31 ENCOUNTER — Ambulatory Visit (INDEPENDENT_AMBULATORY_CARE_PROVIDER_SITE_OTHER): Payer: Medicaid Other | Admitting: Pharmacist

## 2013-05-31 ENCOUNTER — Other Ambulatory Visit: Payer: Self-pay | Admitting: Internal Medicine

## 2013-05-31 ENCOUNTER — Ambulatory Visit (HOSPITAL_COMMUNITY)
Admission: RE | Admit: 2013-05-31 | Discharge: 2013-05-31 | Disposition: A | Discharge status: Home / Self Care | Payer: Medicaid Other | Source: Ambulatory Visit | Attending: Internal Medicine | Admitting: Internal Medicine

## 2013-05-31 ENCOUNTER — Other Ambulatory Visit: Payer: Self-pay | Admitting: Interventional Radiology

## 2013-05-31 ENCOUNTER — Ambulatory Visit (INDEPENDENT_AMBULATORY_CARE_PROVIDER_SITE_OTHER): Payer: Medicaid Other | Admitting: Internal Medicine

## 2013-05-31 VITALS — BP 126/76 | HR 104 | Temp 99.0°F | Ht 75.0 in | Wt 185.1 lb

## 2013-05-31 DIAGNOSIS — M7989 Other specified soft tissue disorders: Secondary | ICD-10-CM | POA: Insufficient documentation

## 2013-05-31 DIAGNOSIS — M25572 Pain in left ankle and joints of left foot: Secondary | ICD-10-CM

## 2013-05-31 DIAGNOSIS — Z7901 Long term (current) use of anticoagulants: Secondary | ICD-10-CM

## 2013-05-31 DIAGNOSIS — M25579 Pain in unspecified ankle and joints of unspecified foot: Secondary | ICD-10-CM

## 2013-05-31 DIAGNOSIS — M25476 Effusion, unspecified foot: Secondary | ICD-10-CM | POA: Insufficient documentation

## 2013-05-31 DIAGNOSIS — I10 Essential (primary) hypertension: Secondary | ICD-10-CM

## 2013-05-31 DIAGNOSIS — M25473 Effusion, unspecified ankle: Secondary | ICD-10-CM | POA: Insufficient documentation

## 2013-05-31 MED ORDER — OXYCODONE-ACETAMINOPHEN 10-325 MG PO TABS: 1.0000 | ORAL_TABLET | Freq: Three times a day (TID) | ORAL | Status: DC | PRN

## 2013-05-31 NOTE — Assessment & Plan Note (Signed)
BP Readings from Last 3 Encounters:  05/31/13 126/76  05/19/13 115/71  05/07/13 141/84    Lab Results  Component Value Date   NA 131* 05/19/2013   K 4.4 05/19/2013   CREATININE 8.13* 05/19/2013    Assessment: Blood pressure control: controlled Progress toward BP goal:  at goal Comments: Well-controlled, despite pain  Plan: Medications:  continue current medications Educational resources provided:   Self management tools provided:   Other plans: Recheck at next visit

## 2013-05-31 NOTE — Progress Notes (Signed)
HPI The patient is a 37 y.o. 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  The patient notes a 2-day history of left ankle pain.  Two days ago, he notes awakening to find his left ankle ankle swollen, and painful, worse with weight-bearing.  The patient has been trying to elevate the area, and has tried heat and cold, with no significant relief.  The patient has taken percocet for the pain, for which he has a pain contract.  He notes no preceding trauma, but does note that the day prior to pain onset he walked for 1 mile, which is more than he usually exercises.  The patient has a FH of gout (father), but no personal history of gout.  The patient notes chills, but no fevers.   ROS: General: no fevers, changes in weight, changes in appetite Skin: no rash HEENT: no blurry vision, hearing changes, sore throat Pulm: no dyspnea, coughing, wheezing CV: no chest pain, palpitations, shortness of breath Abd: no abdominal pain, nausea/vomiting, diarrhea/constipation GU: no dysuria, hematuria, polyuria Ext: see HPI Neuro: no weakness, numbness, or tingling  Filed Vitals:   05/31/13 1532  BP: 126/76  Pulse: 104  Temp: 99 F (37.2 C)    PEX General: alert, cooperative, appears uncomfortable HEENT: pupils equal round and reactive to light, vision grossly intact, oropharynx clear and non-erythematous  Neck: supple, no lymphadenopathy Lungs: clear to ascultation bilaterally, normal work of respiration, no wheezes, rales, ronchi Heart: regular rate and rhythm, no murmurs, gallops, or rubs Abdomen: soft, non-tender, non-distended, normal bowel sounds Extremities: left ankle with 1+ pitting edema, diffusely and significantly tender to palpation of the entire ankle.  No erythema, purulence, or fluctuance Neurologic: alert & oriented X3, cranial nerves II-XII intact, strength grossly intact though limited by pain, sensation  intact to light touch  Current Outpatient Prescriptions on File Prior to Visit  Medication Sig Dispense Refill  . albuterol (PROVENTIL HFA;VENTOLIN HFA) 108 (90 BASE) MCG/ACT inhaler Inhale 2 puffs into the lungs every 6 (six) hours as needed for wheezing.  1 Inhaler  2  . amLODipine (NORVASC) 10 MG tablet Take 1 tablet (10 mg total) by mouth daily.  60 tablet  4  . calcitRIOL (ROCALTROL) 0.25 MCG capsule Take 0.25 mcg by mouth daily.      . ciprofloxacin (CIPRO) 250 MG tablet Take 1 tablet (250 mg total) by mouth every 12 (twelve) hours.  6 tablet  0  . efavirenz (SUSTIVA) 600 MG tablet Take 1 tablet (600 mg total) by mouth at bedtime. Take with AZT and 3TC  30 tablet  11  . lamivudine (EPIVIR) 100 MG tablet Take 1 tablet (100 mg total) by mouth daily.  30 tablet  11  . oxyCODONE-acetaminophen (PERCOCET) 10-325 MG per tablet Take 1 tablet by mouth every 8 (eight) hours as needed for pain. For pain  90 tablet  0  . pramipexole (MIRAPEX) 0.25 MG tablet Take 1 tablet (0.25 mg total) by mouth at bedtime.  30 tablet  2  . sodium bicarbonate 650 MG tablet Take 1 tablet (650 mg total) by mouth 3 (three) times daily.  90 tablet  0  . warfarin (COUMADIN) 5 MG tablet Take 5-7.5 mg by mouth daily. Takes 7.5mg  on mon, wed and fri each week Takes 5mg  all other days      . zidovudine (RETROVIR) 300 MG tablet Take 1 tablet (300 mg total) by mouth 2 (  two) times daily.  60 tablet  11  . [DISCONTINUED] carvedilol (COREG) 25 MG tablet Take 1 tablet (25 mg total) by mouth 2 (two) times daily with a meal.  60 tablet  0   No current facility-administered medications on file prior to visit.    Assessment/Plan

## 2013-05-31 NOTE — Progress Notes (Signed)
I discussed patient with resident Dr. Owens Shark, and I agree with the plans as outlined in his note.  As per his discussion with IR today, they do not feel that his warfarin needs to be reversed for him to undergo ankle arthrocentesis.

## 2013-05-31 NOTE — Patient Instructions (Signed)
Patient instructed to take medications as defined in the Anti-coagulation Track section of this encounter.  Patient instructed to take today's dose.  Patient verbalized understanding of these instructions.    

## 2013-05-31 NOTE — Assessment & Plan Note (Addendum)
The patient notes a 2-day history of left ankle pain, with no preceding trauma.  Differential includes gout (characteristic pain, FH of gout) vs hemarthrosis (therapeutic INR on warfarin for prior DVT's), less likely infected joint (no fever, no erythema).  The case was discussed with Dr. Marinda Elk, as well as with the IR department. -will send patient for plain x-ray of left ankle today -patient has an appointment tomorrow morning at 10:15 for IR drainage of the area -will send aspirated fluid for cell count with differential, culture, and crystal analysis -will treat accordingly based on lab results.  In the meantime, percocet for pain -greatly, greatly appreciate the assistance of the IR and Radiology department!  Addendum:  Today, the patient presented to IR.  I received a page that the department did not feel comfortable with joint aspiration with an INR of 2.7, and they recommended holding warfarin for 4 days, or reversing the INR.  Other options include checking a CRP, WBC count, and MRI of the ankle to further risk stratify our differential diagnoses of gout vs septic joint vs hemarthrosis.  After discussion with Dr. Marinda Elk, we decided that we were concerned that obtaining these tests and studies would delay diagnosis too long, which would be harmful if this was a septic joint.  As such, I called the patient at 14:30 on 6/17, and asked him to present for direct admission, for reversal of anticoagulation, evaluation by orthopedics, and joint aspiration.  The patient noted that he could not get transportation to the hospital, and did not want to call an ambulance.  I explained the risks of delaying diagnosis and treatment if this truly were a septic joint, including sepsis, loss of his joint, or even death.  The patient expressed understanding, but again stated he couldn't present until tomorrow morning.  The patient has been made a clinic appointment at 8:15 tomorrow morning, at which time he can be  re-evaluated, and most likely admitted.  The patient was told to hold his warfarin for tonight.  Elnora Morrison 06/01/2013, 2:42 PM

## 2013-05-31 NOTE — Patient Instructions (Addendum)
General Instructions: Your joint pain could represent a few different things, including blood in the joint, gout, or an infection. -you have an appointment tomorrow (Tuesday, June 16th), at 10:15 am at Radiology (1st floor, [facility]), to drain some fluid from the area for analysis. -once we receive the results of this procedure, we can treat your pain accordingly. -before you get this procedure, we need an x-ray of your ankle.  This is a walk-in procedure that you can do this evening, which is also in Radiology, on the 1st floor of this hospital. -for your pain today, we have refilled your oxycodone-acetaminophen  Please return for a follow-up visit in 1 week, to ensure your pain has resolved.   Treatment Goals:  Goals (1 Years of Data) as of 05/31/13   None      Progress Toward Treatment Goals:  Treatment Goal 05/31/2013  Blood pressure at goal    Self Care Goals & Plans:  Self Care Goal 05/31/2013  Manage my medications take my medicines as prescribed; bring my medications to every visit; refill my medications on time  Eat healthy foods drink diet soda or water instead of juice or soda  Stop smoking go to the [employer] ([email or web address]       Care Management & Community Referrals:  Referral 05/31/2013  Referrals made for care management support none needed

## 2013-05-31 NOTE — Progress Notes (Signed)
Anti-Coagulation Progress Note  Brian [name] is a 37 y.o. male who is currently on an anti-coagulation regimen.    RECENT RESULTS: Recent results are below, the most recent result is correlated with a dose of 45 mg. per week: Lab Results  Component Value Date   INR 2.70 05/31/2013   INR 3.00 05/03/2013   INR 1.50 03/08/2013    ANTI-COAG DOSE: Anticoagulation Dose Instructions as of 05/31/2013     Dorene Grebe Tue Wed Thu Fri Sat   New Dose 7.5 mg 5 mg 7.5 mg 5 mg 7.5 mg 5 mg 7.5 mg       ANTICOAG SUMMARY: Anticoagulation Episode Summary   Current INR goal 2.0-3.0  Next INR check 06/28/2013  INR from last check 2.70 (05/31/2013)  Weekly max dose   Target end date Indefinite  [facility]  Preferred lab   Send INR reminders to    Indications  History of DVT (deep vein thrombosis) (Resolved) [V12.51] Long term (current) use of anticoagulants [V58.61]        Comments         ANTICOAG TODAY: Anticoagulation Summary as of 05/31/2013   INR goal 2.0-3.0  Selected INR 2.70 (05/31/2013)  Next INR check 06/28/2013  Target end date Indefinite   Indications  History of DVT (deep vein thrombosis) (Resolved) [V12.51] Long term (current) use of anticoagulants [V58.61]      Anticoagulation Episode Summary   [facility]   Preferred lab    Send INR reminders to    Comments       PATIENT INSTRUCTIONS: Patient Instructions  Patient instructed to take medications as defined in the Anti-coagulation Track section of this encounter.  Patient instructed to take today's dose.  Patient verbalized understanding of these instructions.       FOLLOW-UP Return in 4 weeks (on 06/28/2013) for Follow up INR at [place], III Pharm.D., CACP

## 2013-06-01 ENCOUNTER — Telehealth: Payer: Self-pay | Admitting: *Deleted

## 2013-06-01 ENCOUNTER — Ambulatory Visit (HOSPITAL_COMMUNITY)
Admission: RE | Admit: 2013-06-01 | Discharge: 2013-06-01 | Disposition: A | Discharge status: Home / Self Care | Payer: Medicaid Other | Source: Ambulatory Visit | Attending: Internal Medicine | Admitting: Internal Medicine

## 2013-06-01 DIAGNOSIS — M25572 Pain in left ankle and joints of left foot: Secondary | ICD-10-CM

## 2013-06-01 NOTE — Telephone Encounter (Signed)
Pt calls and states he came in radiology this am and was told he could not have procedure done because of being on coumadin, he states he is worried about blood clots and doesn't know what to do. States he cannot come back to office today due to transportation issues. appt is made w/ dr brown at [facility] 6/18. He is ask if he becomes worse or more concerned to please go to ED or urg care, he is agreeable

## 2013-06-01 NOTE — Telephone Encounter (Signed)
I spoke with the patient.  Please see my addendum to progress note 6/16.  The patient will present to clinic tomorrow morning.

## 2013-06-01 NOTE — Telephone Encounter (Signed)
review 

## 2013-06-02 ENCOUNTER — Encounter: Payer: Self-pay | Admitting: Internal Medicine

## 2013-06-02 ENCOUNTER — Observation Stay (HOSPITAL_COMMUNITY): Payer: Medicaid Other

## 2013-06-02 ENCOUNTER — Inpatient Hospital Stay (HOSPITAL_COMMUNITY)
Admission: AD | Admit: 2013-06-02 | Discharge: 2013-06-04 | DRG: 553 | Disposition: A | Discharge status: Home / Self Care | Payer: Medicaid Other | Source: Ambulatory Visit | Attending: Internal Medicine | Admitting: Internal Medicine

## 2013-06-02 ENCOUNTER — Ambulatory Visit (INDEPENDENT_AMBULATORY_CARE_PROVIDER_SITE_OTHER): Payer: Medicaid Other | Admitting: Internal Medicine

## 2013-06-02 ENCOUNTER — Encounter (HOSPITAL_COMMUNITY): Payer: Self-pay | Admitting: Internal Medicine

## 2013-06-02 ENCOUNTER — Ambulatory Visit: Payer: Medicaid Other | Admitting: Internal Medicine

## 2013-06-02 ENCOUNTER — Ambulatory Visit (HOSPITAL_COMMUNITY)
Admission: RE | Admit: 2013-06-02 | Discharge: 2013-06-02 | Disposition: A | Discharge status: Home / Self Care | Payer: Medicaid Other | Source: Ambulatory Visit | Attending: Internal Medicine | Admitting: Internal Medicine

## 2013-06-02 VITALS — BP 138/89 | HR 105 | Temp 97.3°F | Ht 75.0 in | Wt 186.9 lb

## 2013-06-02 DIAGNOSIS — I10 Essential (primary) hypertension: Secondary | ICD-10-CM | POA: Diagnosis present

## 2013-06-02 DIAGNOSIS — Z86711 Personal history of pulmonary embolism: Secondary | ICD-10-CM

## 2013-06-02 DIAGNOSIS — R000 Tachycardia, unspecified: Secondary | ICD-10-CM | POA: Diagnosis present

## 2013-06-02 DIAGNOSIS — F1210 Cannabis abuse, uncomplicated: Secondary | ICD-10-CM | POA: Diagnosis present

## 2013-06-02 DIAGNOSIS — M25572 Pain in left ankle and joints of left foot: Secondary | ICD-10-CM | POA: Diagnosis present

## 2013-06-02 DIAGNOSIS — M25072 Hemarthrosis, left ankle: Secondary | ICD-10-CM

## 2013-06-02 DIAGNOSIS — Z21 Asymptomatic human immunodeficiency virus [HIV] infection status: Secondary | ICD-10-CM | POA: Diagnosis present

## 2013-06-02 DIAGNOSIS — E079 Disorder of thyroid, unspecified: Secondary | ICD-10-CM | POA: Diagnosis present

## 2013-06-02 DIAGNOSIS — Z79899 Other long term (current) drug therapy: Secondary | ICD-10-CM

## 2013-06-02 DIAGNOSIS — I120 Hypertensive chronic kidney disease with stage 5 chronic kidney disease or end stage renal disease: Secondary | ICD-10-CM | POA: Diagnosis present

## 2013-06-02 DIAGNOSIS — Z7901 Long term (current) use of anticoagulants: Secondary | ICD-10-CM

## 2013-06-02 DIAGNOSIS — N186 End stage renal disease: Secondary | ICD-10-CM

## 2013-06-02 DIAGNOSIS — F17200 Nicotine dependence, unspecified, uncomplicated: Secondary | ICD-10-CM | POA: Diagnosis present

## 2013-06-02 DIAGNOSIS — F3289 Other specified depressive episodes: Secondary | ICD-10-CM | POA: Diagnosis present

## 2013-06-02 DIAGNOSIS — Q613 Polycystic kidney, unspecified: Secondary | ICD-10-CM

## 2013-06-02 DIAGNOSIS — F1010 Alcohol abuse, uncomplicated: Secondary | ICD-10-CM | POA: Diagnosis present

## 2013-06-02 DIAGNOSIS — B20 Human immunodeficiency virus [HIV] disease: Secondary | ICD-10-CM | POA: Diagnosis present

## 2013-06-02 DIAGNOSIS — M25579 Pain in unspecified ankle and joints of unspecified foot: Secondary | ICD-10-CM

## 2013-06-02 DIAGNOSIS — M25073 Hemarthrosis, unspecified ankle: Secondary | ICD-10-CM | POA: Diagnosis present

## 2013-06-02 DIAGNOSIS — M25076 Hemarthrosis, unspecified foot: Principal | ICD-10-CM | POA: Diagnosis present

## 2013-06-02 DIAGNOSIS — F329 Major depressive disorder, single episode, unspecified: Secondary | ICD-10-CM | POA: Diagnosis present

## 2013-06-02 DIAGNOSIS — G8929 Other chronic pain: Secondary | ICD-10-CM | POA: Diagnosis present

## 2013-06-02 DIAGNOSIS — F411 Generalized anxiety disorder: Secondary | ICD-10-CM | POA: Diagnosis present

## 2013-06-02 DIAGNOSIS — Z86718 Personal history of other venous thrombosis and embolism: Secondary | ICD-10-CM

## 2013-06-02 LAB — SYNOVIAL CELL COUNT + DIFF, W/ CRYSTALS
Eosinophils-Synovial: 0 % (ref 0–1)
Lymphocytes-Synovial Fld: 20 % (ref 0–20)
Neutrophil, Synovial: 37 % — ABNORMAL HIGH (ref 0–25)
WBC, Synovial: 1031 /mm3 — ABNORMAL HIGH (ref 0–200)

## 2013-06-02 LAB — RENAL FUNCTION PANEL
Albumin: 3.8 g/dL (ref 3.5–5.2)
BUN: 73 mg/dL — ABNORMAL HIGH (ref 6–23)
CO2: 22 mEq/L (ref 19–32)
Chloride: 99 mEq/L (ref 96–112)
Creat: 7.87 mg/dL — ABNORMAL HIGH (ref 0.50–1.35)
Glucose, Bld: 89 mg/dL (ref 70–99)

## 2013-06-02 LAB — CBC WITH DIFFERENTIAL/PLATELET
Eosinophils Absolute: 0.1 10*3/uL (ref 0.0–0.7)
Eosinophils Relative: 1 % (ref 0–5)
HCT: 26.4 % — ABNORMAL LOW (ref 39.0–52.0)
Lymphocytes Relative: 19 % (ref 12–46)
Lymphs Abs: 1.4 10*3/uL (ref 0.7–4.0)
MCH: 38.6 pg — ABNORMAL HIGH (ref 26.0–34.0)
MCV: 111.9 fL — ABNORMAL HIGH (ref 78.0–100.0)
Monocytes Absolute: 0.6 10*3/uL (ref 0.1–1.0)
Monocytes Relative: 8 % (ref 3–12)
Platelets: 185 10*3/uL (ref 150–400)
RBC: 2.36 MIL/uL — ABNORMAL LOW (ref 4.22–5.81)
WBC: 7.5 10*3/uL (ref 4.0–10.5)

## 2013-06-02 LAB — GRAM STAIN

## 2013-06-02 LAB — MRSA PCR SCREENING: MRSA by PCR: NEGATIVE

## 2013-06-02 MED ORDER — PRAMIPEXOLE DIHYDROCHLORIDE 0.25 MG PO TABS
0.2500 mg | ORAL_TABLET | Freq: Every day | ORAL | Status: DC
  Administered 2013-06-02 – 2013-06-03 (×2): 0.25 mg via ORAL
  Filled 2013-06-02 (×3): qty 1

## 2013-06-02 MED ORDER — OXYCODONE-ACETAMINOPHEN 10-325 MG PO TABS: 1.0000 | ORAL_TABLET | ORAL | Status: DC | PRN

## 2013-06-02 MED ORDER — EFAVIRENZ 600 MG PO TABS
600.0000 mg | ORAL_TABLET | Freq: Every day | ORAL | Status: DC
  Administered 2013-06-02 – 2013-06-03 (×2): 600 mg via ORAL
  Filled 2013-06-02 (×3): qty 1

## 2013-06-02 MED ORDER — LAMIVUDINE 100 MG PO TABS
100.0000 mg | ORAL_TABLET | Freq: Every day | ORAL | Status: DC
  Filled 2013-06-02: qty 1

## 2013-06-02 MED ORDER — OXYCODONE-ACETAMINOPHEN 5-325 MG PO TABS
1.0000 | ORAL_TABLET | ORAL | Status: DC | PRN
  Administered 2013-06-02 – 2013-06-04 (×3): 1 via ORAL
  Filled 2013-06-02 (×3): qty 1

## 2013-06-02 MED ORDER — SODIUM CHLORIDE 0.9 % IJ SOLN
3.0000 mL | Freq: Two times a day (BID) | INTRAMUSCULAR | Status: DC
  Administered 2013-06-04: 3 mL via INTRAVENOUS

## 2013-06-02 MED ORDER — OXYCODONE HCL 5 MG PO TABS
5.0000 mg | ORAL_TABLET | ORAL | Status: DC | PRN
  Administered 2013-06-02: 5 mg via ORAL
  Filled 2013-06-02 (×2): qty 1

## 2013-06-02 MED ORDER — AMLODIPINE BESYLATE 10 MG PO TABS
10.0000 mg | ORAL_TABLET | Freq: Every day | ORAL | Status: DC
  Administered 2013-06-02 – 2013-06-04 (×3): 10 mg via ORAL
  Filled 2013-06-02 (×3): qty 1

## 2013-06-02 MED ORDER — ONDANSETRON HCL 4 MG PO TABS: 4.0000 mg | ORAL_TABLET | Freq: Four times a day (QID) | ORAL | Status: DC | PRN

## 2013-06-02 MED ORDER — VANCOMYCIN HCL IN DEXTROSE 1-5 GM/200ML-% IV SOLN
1000.0000 mg | INTRAVENOUS | Status: DC
  Administered 2013-06-02: 1000 mg via INTRAVENOUS
  Filled 2013-06-02 (×2): qty 200

## 2013-06-02 MED ORDER — SODIUM CHLORIDE 0.9 % IV SOLN
250.0000 mL | INTRAVENOUS | Status: DC | PRN
  Administered 2013-06-02 – 2013-06-03 (×2): 250 mL via INTRAVENOUS

## 2013-06-02 MED ORDER — CALCITRIOL 0.25 MCG PO CAPS
0.2500 ug | ORAL_CAPSULE | Freq: Every day | ORAL | Status: DC
  Administered 2013-06-03 – 2013-06-04 (×2): 0.25 ug via ORAL
  Filled 2013-06-02 (×3): qty 1

## 2013-06-02 MED ORDER — ONDANSETRON HCL 4 MG/2ML IJ SOLN: 4.0000 mg | Freq: Four times a day (QID) | INTRAMUSCULAR | Status: DC | PRN

## 2013-06-02 MED ORDER — MORPHINE SULFATE 4 MG/ML IJ SOLN
4.0000 mg | INTRAMUSCULAR | Status: DC | PRN
  Administered 2013-06-02 – 2013-06-04 (×10): 4 mg via INTRAVENOUS
  Filled 2013-06-02 (×10): qty 1

## 2013-06-02 MED ORDER — SODIUM BICARBONATE 650 MG PO TABS
650.0000 mg | ORAL_TABLET | Freq: Three times a day (TID) | ORAL | Status: DC
  Administered 2013-06-02 – 2013-06-04 (×7): 650 mg via ORAL
  Filled 2013-06-02: qty 1
  Filled 2013-06-02: qty 2
  Filled 2013-06-02 (×5): qty 1

## 2013-06-02 MED ORDER — LAMIVUDINE 10 MG/ML PO SOLN
100.0000 mg | Freq: Every day | ORAL | Status: DC
  Administered 2013-06-02 – 2013-06-04 (×3): 100 mg via ORAL
  Filled 2013-06-02 (×3): qty 10

## 2013-06-02 MED ORDER — SODIUM CHLORIDE 0.9 % IJ SOLN: 3.0000 mL | INTRAMUSCULAR | Status: DC | PRN

## 2013-06-02 MED ORDER — DEXTROSE 5 % IV SOLN
1.0000 g | INTRAVENOUS | Status: DC
  Administered 2013-06-02: 1 g via INTRAVENOUS
  Filled 2013-06-02 (×2): qty 10

## 2013-06-02 MED ORDER — ZIDOVUDINE 100 MG PO CAPS
300.0000 mg | ORAL_CAPSULE | Freq: Two times a day (BID) | ORAL | Status: DC
  Administered 2013-06-02 – 2013-06-04 (×4): 300 mg via ORAL
  Filled 2013-06-02 (×6): qty 3

## 2013-06-02 NOTE — Progress Notes (Signed)
HPI The patient is a 37 y.o. 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  The patient notes a 4-day history of left ankle pain.  Four days ago, he awoke with a swollen, painful left ankle, which was worse with weight-bearing.  The patient tried applying heat and cold with no significant relief.  Percocet takes away most of the pain.  He notes no preceding trauma, though he does note walking 1 mile on the day prior to pain onset, which is more than he usually walks.  The patient has a FH of gout (father), but no personal history.  He notes chills, but no fevers.  The patient was seen in clinic 2 days ago, and his ankle was thought to be likely gout, though cannot exclude hemarthrosis vs septic joint.  He was scheduled for arthrocentesis yesterday with IR, but the procedure was unable to be performed due to his INR.  The patient was advised to present for admission, but refused, due to transportation issues, but agreed to present to clinic for re-evaluation this morning.  Today, pain is unchanged, though he notes that he has been able to walk on the ankle a little more than he was 2 days ago.  Still chills, but no fever.  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  ROS: General: no fevers, changes in weight, changes in appetite Skin: no rash HEENT: no blurry vision, hearing changes, sore throat Pulm: no dyspnea, coughing, wheezing CV: no chest pain, palpitations, shortness of breath Abd: no abdominal pain, nausea/vomiting, diarrhea/constipation GU: no dysuria, hematuria, polyuria Ext: see HPI Neuro: no weakness, numbness, or tingling  Filed Vitals:   06/02/13 0825  BP: 138/89  Pulse: 105  Temp: 97.3 F (36.3 C)    PEX General: alert, cooperative, appears uncomfortable HEENT: pupils equal round and reactive to light, vision grossly intact, oropharynx clear and  non-erythematous  Neck: supple, no lymphadenopathy Lungs: clear to ascultation bilaterally, normal work of respiration, no wheezes, rales, ronchi Heart: regular rate and rhythm, no murmurs, gallops, or rubs Abdomen: soft, non-tender, non-distended, normal bowel sounds  Extremities: left ankle with 1+ pitting edema, diffusely and significantly tender to palpation of the entire ankle. No erythema, purulence, or fluctuance Neurologic: alert & oriented X3, cranial nerves II-XII intact, strength grossly intact though limited by pain, sensation intact to light touch   Current Outpatient Prescriptions on File Prior to Visit  Medication Sig Dispense Refill  . albuterol (PROVENTIL HFA;VENTOLIN HFA) 108 (90 BASE) MCG/ACT inhaler Inhale 2 puffs into the lungs every 6 (six) hours as needed for wheezing.  1 Inhaler  2  . amLODipine (NORVASC) 10 MG tablet Take 1 tablet (10 mg total) by mouth daily.  60 tablet  4  . calcitRIOL (ROCALTROL) 0.25 MCG capsule Take 0.25 mcg by mouth daily.      . ciprofloxacin (CIPRO) 250 MG tablet Take 1 tablet (250 mg total) by mouth every 12 (twelve) hours.  6 tablet  0  . efavirenz (SUSTIVA) 600 MG tablet Take 1 tablet (600 mg total) by mouth at bedtime. Take with AZT and 3TC  30 tablet  11  . lamivudine (EPIVIR) 100 MG tablet Take 1 tablet (100 mg total) by mouth daily.  30 tablet  11  . oxyCODONE-acetaminophen (PERCOCET) 10-325 MG per tablet Take 1 tablet by mouth every 8 (eight) hours as needed for pain. For  pain  90 tablet  0  . pramipexole (MIRAPEX) 0.25 MG tablet Take 1 tablet (0.25 mg total) by mouth at bedtime.  30 tablet  2  . sodium bicarbonate 650 MG tablet Take 1 tablet (650 mg total) by mouth 3 (three) times daily.  90 tablet  0  . warfarin (COUMADIN) 5 MG tablet Take 5-7.5 mg by mouth daily. Takes 7.5mg  on mon, wed and fri each week Takes 5mg  all other days      . zidovudine (RETROVIR) 300 MG tablet Take 1 tablet (300 mg total) by mouth 2 (two) times daily.  60  tablet  11  . [DISCONTINUED] carvedilol (COREG) 25 MG tablet Take 1 tablet (25 mg total) by mouth 2 (two) times daily with a meal.  60 tablet  0   No current facility-administered medications on file prior to visit.    Assessment/Plan
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
## 2013-06-02 NOTE — Assessment & Plan Note (Addendum)
The patient continues to note left ankle pain, which has now been present for 4 days.  He is able to walk on the joint more, but states the pain is relatively unchanged.  Patient is afebrile.  X-ray from 6/16 shows joint effusion.  Differential still includes gout (characteristic pain, FH of gout) vs hemarthrosis (therapeutic INR on warfarin for prior DVT's), but cannot rule out infected joint (significant pain).  The case was discussed with Dr. [name].  Since we cannot rule out septic joint, we will admit for an observation admission, for arthrocentesis, either by Orthopedics or Interventional Radiology.  Of note, the patient did not take his warfarin last night, per my instructions. -we recommend Orthopedic consult -continue to hold warfarin, consider a small dose of vit K (maybe 2.5 mg) to reverse INR -consider checking cbc, crp -consider continuing percocet for pain

## 2013-06-02 NOTE — Progress Notes (Signed)
Results of labs faxed to Dr. Moshe Cipro at [facility] by [facility] on 06/02/13 at 11:45am.
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
## 2013-06-03 LAB — BASIC METABOLIC PANEL
CO2: 18 mEq/L — ABNORMAL LOW (ref 19–32)
Calcium: 8.2 mg/dL — ABNORMAL LOW (ref 8.4–10.5)
Creatinine, Ser: 7.75 mg/dL — ABNORMAL HIGH (ref 0.50–1.35)
GFR calc non Af Amer: 8 mL/min — ABNORMAL LOW (ref >90)
Glucose, Bld: 80 mg/dL (ref 70–99)

## 2013-06-03 MED ORDER — DEXTROSE 5 % IV SOLN
2.0000 g | INTRAVENOUS | Status: DC
  Administered 2013-06-03: 2 g via INTRAVENOUS
  Filled 2013-06-03 (×2): qty 2

## 2013-06-03 MED ORDER — NEPRO/CARBSTEADY PO LIQD
237.0000 mL | ORAL | Status: DC
  Administered 2013-06-03 – 2013-06-04 (×2): 237 mL via ORAL
  Filled 2013-06-03 (×3): qty 237

## 2013-06-03 MED ORDER — WARFARIN - PHARMACIST DOSING INPATIENT: Freq: Every day | Status: DC

## 2013-06-03 MED ORDER — WARFARIN SODIUM 7.5 MG PO TABS
7.5000 mg | ORAL_TABLET | Freq: Once | ORAL | Status: AC
  Administered 2013-06-03: 7.5 mg via ORAL
  Filled 2013-06-03: qty 1
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
## 2013-06-03 NOTE — Progress Notes (Signed)
Pharmacy student rounding with [facility], asked to consult for re-initiation of warfarin therapy for Brian [name], a 37 year old male patient who was admitted to the hospital on 02-June-2013 from the [facility] with a chief complaint of left ankle pain. Approximately 39mL of bloody fluid (described as turbid and red) was aspirated from the ankle joint, which could be the result of a traumatic stick or possible hemarthrosis. The gram stain of this collected synovial fluid showed moderate WBC present (1031 WBC), with both PMN and mononuclear organisms seen.  This is most likely due to septic/bacterial arthritis. 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 Patient's INR was 2.7 at his [facility] visit on 16-June 2014 on a dosage of 45mg  of warfarin per week.   Have evaluated Brian [name]'s baseline laboratory values as documented in Brian [name]'s H&P exam.  POCT- INR- 2.5 (02-June-2013 at 0933) PT-INR 2.06 (03-June-2013 at 0550)  Weighing the competing risks of additional VTEs with holding warfarin verses the lower risk of possible hemarthrosis, we recommend to re-initiate warfarin therapy today (03-June-2013) on his previous dosing recommendations and schedule.  (See below)   Sunday Monday Tuesday Wednesday Thursday Friday Saturday   7.5mg  5mg  7.5mg  5mg  7.5mg  5mg  7.5mg    Total=45mg /week  INR Goal=2-3 (VTE PPX)  Warfarin dosage adjustment will be necessary if he is initiated on trimethoprim-sulfamethoxazole or a floroquinolone antibiotic. These adjustments can be made by Dr. Elie Confer before discharge (if the patient is discharged on Friday, 04-June-2013) or at the patient's follow-up appointment on Monday, 07-June-2013 (if the patient is discharged over the weekend, 21-06-June-2013)  After discharge, patient requires follow up with Dr. Elie Confer in the [facility]. If discharged prior to Sunday (06-June-2013), an appointment needs to be scheduled for next Monday (07-June-2013). It is of the upmost importance that  the patient is seen at this scheduled appointment, so that any needed adjustments can be made to his current anticoagulation therapy.    Monitor Inpatient:    INR Daily (Goal=2-3)   Headache/Dizziness/Weakness   Hematuria   Hematemesis   Hemoptysis   Melena   Hematochezia   Any other signs of bleeding     Swelling/Pain  Instruct Patient to Monitor for the Following at Discharge:   Fever    Erythema at Site   Headache/Dizziness/Weakness   Hematuria   Hematemesis   Hemoptysis   Melena   Hematochezia   Any other signs of bleeding     Swelling/Pain  Would also recommend status post discharge INR within 5 days.   Completed By: Philomena Doheny, [place], Doctor of Pharmacy Candidate [phone]

## 2013-06-03 NOTE — H&P (Signed)
Internal Medicine Teaching Service Attending Note Date: 06/03/2013  Patient name: Brian [name]  Medical record number: [id number]  Date of birth: 03/20/1976   Mr. 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 He complains of cramping in left leg. He was tachycardic, and has had Tmax of 77F.   Synovial fluid analysis so far shows:   Results for SASHA, HAUER (MRN [id number]) as of 06/03/2013 15:28  Ref. Range 06/02/2013 15:16  Color, Synovial  YELLOW  RED (A)  Appearance-Synovial  CLEAR  TURBID (A)  Crystals, Fluid No range found NO CRYSTALS SEEN  WBC, Synovial 0-200 /cu mm 1031 (H)  Neutrophil, Synovial 0-25 % 37 (H)  Lymphocytes-Synovial Fld 0-20 % 20  Monocyte-Macrophage-Synovial Fluid 50-90 % 43 (L)  Eosinophils-Synovial  0-1 % 0   Left foot exam: Left ankle very tender to palpation, though better than yesterday. 1= pitting edema. ROM extremely limitied. Did not appreciate erythema.  Right ankle - normal.  Dorsalis Pedis and Post Tibial pulses are intact bilaterally    Assessment and Plan   I have read the note by Dr. Obie Dredge and I agree with his exam and assessment, with these additions to his chain of thought :-   Septic Arthritis versus hemarthrosis- He was on vancomycin and ceftriaxone empiric therapy. We will discontinue ceftriaxone and start ceftazidime for broader coverage given HIV. In the setting of inconclusive results, we will obtain ortho and ID consults. If this hemarthrosis, we would manage conservatively (immbilization, pain control, and aspiration that has already been done).    Since there seems to be no active bleed, and the swelling has been fairly stable, we will restart coumadin which was on hold for arthrocentesis.   Rest chronic issues per Dr. Jill Alexanders note. [name] 06/03/2013, 5:21 PM.

## 2013-06-03 NOTE — Progress Notes (Signed)
Subjective: Pt had L ankle arthrocentesis yesterday evening. He has kept LLE elevated since then and has not ambulated. Pain is improved with oxycodone but not completely resolved. Denies fevers/chills, pain in any other joints. He did provide additional history that he has been having frequent cramps in his LLE for the past few months. The cramps have resolved after the ankle swelling started 5 days ago. No other complaints.   Objective: Vital signs in last 24 hours: Filed Vitals:   06/02/13 1556 06/02/13 1746 06/02/13 2248 06/03/13 0505  BP: 137/86 124/79 124/86 119/77  Pulse: 106 93 98 89  Temp: 97.9 F (36.6 C) 98.1 F (36.7 C) 98.4 F (36.9 C) 98.1 F (36.7 C)  TempSrc: Oral Oral Oral Oral  Resp: 18 18 18 18   Height:      Weight:      SpO2: 98% 95% 98% 97%    Intake/Output Summary (Last 24 hours) at 06/03/13 1103 Last data filed at 06/03/13 0951  Gross per 24 hour  Intake   1535 ml  Output   1400 ml  Net    135 ml    Constitutional: Pt with L ankle propped up on pillow with overlying ice pack, lying in bed in NAD. Alert, cooperative. Head: Normocephalic and atraumatic  Eyes: EOMI, conjunctivae normal, No scleral icterus.  Cardiovascular: RRR, 4/6 systolic murmur loudest at RUSB, prominent PMI  Pulmonary/Chest: normal respiratory effort, CTAB  Abdominal: Soft. Non-tender, non-distended, bowel sounds are normal, no masses, organomegaly, or guarding present.  Extremities: Left ankle s/p arthrocentesis with tegaderm in place and minimal dried blood underneath. TTP in posterior lateral and posterior medial sides of ankle. No fluid collection or edema appreciated. ROM limited by pain but pt is able to move toes. No noticeable erythema or increased warmth compared to R ankle. DP and PT pulses 2+ bilaterally.   Neurological: A&O x3, cranial nerve II-XII are grossly intact, no focal motor deficit, sensory intact to light touch bilaterally.  Skin: Warm, dry and intact. No visible  rashes.   Lab Results: Basic Metabolic Panel:  Recent Labs Lab 06/02/13 0925 06/03/13 0550  NA 136 137  K 5.5* 4.6  CL 99 102  CO2 22 18*  GLUCOSE 89 80  BUN 73* 73*  CREATININE 7.87* 7.75*  CALCIUM 8.2* 8.2*  PHOS 6.9*  --    Liver Function Tests:  Recent Labs Lab 06/02/13 0925  ALBUMIN 3.8   CBC:  Recent Labs Lab 06/02/13 0925  WBC 7.5  NEUTROABS 5.4  HGB 9.1*  HCT 26.4*  MCV 111.9*  PLT 185   Coagulation:  Recent Labs Lab 05/31/13 1518 06/02/13 0933 06/03/13 0550  LABPROT  --   --  22.4*  INR 2.70 2.5 2.06*   Misc. Labs: Synovial Fluid: Red and turbid, no crystals, WBC 1031, PMNs 37, Lymphs 20, Monos 43, Eos 0  Micro Results: Recent Results (from the past 240 hour(s))  MRSA PCR SCREENING     Status: None   Collection Time    06/02/13 11:04 AM      Result Value Range Status   MRSA by PCR NEGATIVE  NEGATIVE Final   Comment:            The GeneXpert MRSA Assay (FDA     approved for NASAL specimens     only), is one component of a     comprehensive MRSA colonization     surveillance program. It is not     intended to diagnose MRSA  infection nor to guide or     monitor treatment for     MRSA infections.  CULTURE, BLOOD (ROUTINE X 2)     Status: None   Collection Time    06/02/13 11:50 AM      Result Value Range Status   Specimen Description BLOOD LEFT ARM   Final   Special Requests BOTTLES DRAWN AEROBIC ONLY [place]   Final   Culture  Setup Time 06/02/2013 18:09   Final   Culture     Final   Value:        BLOOD CULTURE RECEIVED NO GROWTH TO DATE CULTURE WILL BE HELD FOR 5 DAYS BEFORE ISSUING A FINAL NEGATIVE REPORT   Report Status PENDING   Incomplete  CULTURE, BLOOD (ROUTINE X 2)     Status: None   Collection Time    06/02/13 11:57 AM      Result Value Range Status   Specimen Description BLOOD LEFT FOREARM   Final   Special Requests BOTTLES DRAWN AEROBIC ONLY [facility]   Final   Culture  Setup Time 06/02/2013 18:10   Final   Culture      Final   Value:        BLOOD CULTURE RECEIVED NO GROWTH TO DATE CULTURE WILL BE HELD FOR 5 DAYS BEFORE ISSUING A FINAL NEGATIVE REPORT   Report Status PENDING   Incomplete  BODY FLUID CULTURE     Status: None   Collection Time    06/02/13  3:16 PM      Result Value Range Status   Specimen Description SYNOVIAL FLUID ANKLE LEFT   Final   Special Requests NONE   Final   Gram Stain     Final   Value: MODERATE WBC PRESENT,BOTH PMN AND MONONUCLEAR     NO ORGANISMS SEEN     Performed at [facility]   Culture NO GROWTH 1 DAY   Final   Report Status PENDING   Incomplete  GRAM STAIN     Status: None   Collection Time    06/02/13  3:16 PM      Result Value Range Status   Specimen Description SYNOVIAL FLUID ANKLE LEFT   Final   Special Requests NONE   Final   Gram Stain     Final   Value: MODERATE WBC PRESENT,BOTH PMN AND MONONUCLEAR     NO ORGANISMS SEEN   Report Status 06/02/2013 FINAL   Final   Studies/Results: Dg Fluoro Guide Ndl Plc/bx  06/02/2013   *RADIOLOGY REPORT*  Clinical Data:  Ankle swelling  LEFT ANKLE ASPIRATION UNDER FLUOROSCOPY  Comparison:  05/31/13  Technique: Written informed consent was obtained.  Overlying skin prepped with Betadine, draped in the usual sterile fashion, and infiltrated locally with buffered Lidocaine.  22 gauge needle advanced to the dorsal medial margin of the left tibiotalor. Next, approximately 6 ml of bloody fluid was aspirated from the ankle joint.  Sample was submitted to laboratory for analysis.  Fluoroscopy Time: 3 minutes and 10 seconds  IMPRESSION: Technically successful left ankle aspiration .   Original Report Authenticated By: Kerby Moors, M.D.   Medications: I have reviewed the patient's current medications. Scheduled Meds: . amLODipine  10 mg Oral Daily  . calcitRIOL  0.25 mcg Oral Daily  . cefTRIAXone (ROCEPHIN)  IV  1 g Intravenous Q24H  . efavirenz  600 mg Oral QHS  . lamiVUDine  100 mg Oral Daily  . pramipexole  0.25 mg Oral  QHS  .  sodium bicarbonate  650 mg Oral TID  . sodium chloride  3 mL Intravenous Q12H  . vancomycin  1,000 mg Intravenous Q48H  . zidovudine  300 mg Oral BID   PRN Meds:.sodium chloride, morphine injection, ondansetron (ZOFRAN) IV, ondansetron, oxyCODONE, oxyCODONE-acetaminophen, sodium chloride   Assessment/Plan: Brian [name] is a 37 year old man with a left ankle swelling that is concerning for septic arthritis.   1. Left ankle swelling: Now s/p arthrocentesis with appropriate tenderness on exam. Differential currently includes septic arthritis vs hemarthrosis. Synovial fluid with only 1031 WBC but given his HIV and immunosuppression he could still have a septic joint. Gram stain of synovial fluid showing moderate WBC, both PMNs and mononuclear but no organisms. Synovial culture pending. Blood cultures neg x1 day. INR 2.06 today  - Continue to follow cultures - Continue Vancomycin. Switch Ceftriaxone to Ceftazidime for pseudomonas coverage. - Pain control with oxycodone q4hr prn  2. 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 Moshe Cipro and has a fistula in place that is mature. They have discussed starting dialysis and but pt is hopeful for a transplant.  - Continue Calcitriol, Bicarb tabs  - Followup appointment with Dr. Moshe Cipro on June 30th  3. 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 INR 2.06 today.  - Resuming coumadin today per pharmacy. Goal INR 2-3.   4. HIV: Well controlled at home on his current medications.  - Continue efavirenz, lamivudine, zidovudine  5. HTN: BPs overnight 119-138/77-89.  - Continue home amlodipine 10mg  qd  Dispo: Disposition is deferred at this time, awaiting improvement of current medical problems. Anticipated discharge in approximately 2-3 day(s).  The patient does have a current PCP [name], MD) and does need an [facility] hospital follow-up appointment after discharge.  The patient does not  have transportation limitations that hinder transportation to clinic appointments.   This is a Careers information officer Note.  The care of the patient was discussed with Dr. Eula Fried and Dr. Ellwood Dense and the assessment and plan formulated with their assistance.  Please see their attached note for official documentation of the daily encounter.   LOS: 1 day   Brian [name], Med Student 06/03/2013, 11:03 AM

## 2013-06-03 NOTE — Progress Notes (Signed)
I have seen the patient and reviewed the daily progress note by [name], MS 3 and discussed the care of the patient with them.  See below for documentation of my findings, assessment, and plans.  Subjective: Brian [name] was seen and examined at bedside this morning.  He reports improved swelling of his ankle today since aspiration, however still has bouts of throbbing ankle pain intermittently.  He reports prior to noticing the swelling, he did have painful left leg cramps intermittently over the past month for unknown reason and that he would need to walk them off until they went away.  He also reports that his left leg is where he has had prior DVTs but has never had cramps before this.  Currently he denies any cramps, headaches, chest pain, N/V/D, abdominal pain, dysuria, fever, chills, weakness, or any other joint pain at this time. He has not been sexually active since his HIV diagnosis.    Objective: Vital signs in last 24 hours: Filed Vitals:   06/02/13 2248 06/03/13 0505 06/03/13 0815 06/03/13 1356  BP: 124/86 119/77  130/92  Pulse: 98 89  97  Temp: 98.4 F (36.9 C) 98.1 F (36.7 C)  97.8 F (36.6 C)  TempSrc: Oral Oral  Oral  Resp: 18 18  18   Height:      Weight:   186 lb 13.4 oz (84.75 kg)   SpO2: 98% 97%  100%   Weight change:   Intake/Output Summary (Last 24 hours) at 06/03/13 1450 Last data filed at 06/03/13 0951  Gross per 24 hour  Intake   1535 ml  Output   1400 ml  Net    135 ml   Vitals reviewed. General: resting in bed, foot elevated on pillow, NAD HEENT: PERRL, EOMI, no scleral icterus Cardiac: RRR, blowing murmur Pulm: clear to auscultation bilaterally, no wheezes, rales, or rhonchi Abd: soft, nontender, nondistended, BS present Ext: warm and well perfused, improved left ankle edema, tender to palpation left ankle region, +2dp b/l, left arm fistula with palpable thrill and bruit, good ROM of b/l upper extremities and RLE extremity, slightly limited in  ROM of left foot due to pain in ankle. Neuro: alert and oriented X3, cranial nerves II-XII grossly intact, strength and sensation to light touch equal in bilateral upper and lower extremities  Lab Results: Reviewed and documented in Electronic Record Micro Results: Reviewed and documented in Electronic Record Studies/Results: Reviewed and documented in Electronic Record Medications: I have reviewed the patient's current medications. Scheduled Meds: . amLODipine  10 mg Oral Daily  . calcitRIOL  0.25 mcg Oral Daily  . cefTAZidime (FORTAZ)  IV  2 g Intravenous Q24H  . efavirenz  600 mg Oral QHS  . lamiVUDine  100 mg Oral Daily  . pramipexole  0.25 mg Oral QHS  . sodium bicarbonate  650 mg Oral TID  . sodium chloride  3 mL Intravenous Q12H  . vancomycin  1,000 mg Intravenous Q48H  . warfarin  7.5 mg Oral ONCE-1800  . Warfarin - Pharmacist Dosing Inpatient   Does not apply q1800  . zidovudine  300 mg Oral BID   Continuous Infusions:  PRN Meds:.sodium chloride, morphine injection, ondansetron (ZOFRAN) IV, ondansetron, oxyCODONE, oxyCODONE-acetaminophen, sodium chloride Assessment/Plan: Mr. [name] is a 37 year old man with a left ankle swelling admitted for ?  septic arthritis.   1. Left ankle swelling x 4 day history of left ankle swelling that is exquisitely tender to palpation and with any ROM. He  denies a history of gout or previous injury to the ankle though he states that he was told he has no cartilage in his ankles from years of skateboarding as a younger man. His father has a history of gout. Differential diagnosis includes septic arthritis vs gout vs hemarthrosis secondary to coumadin. S/p arthrocentesis 06/02/13.  INR on admission 2.5, coumadin held, down to 2.06 today.  Arthrocentesis: red, turbid, NO CRYSTALS, 1031 wbc, 27 neutrophils, 20 lymphycytes, and 43 monocyte-macrophages.  No organisms seen on gram stain.   - continue IV antibiotics empirically: ceftazidime and vancomycin  given immunocompromise with HIV and possible pseudomonas.   - resume coumadin per pharmacy - f/u synovial fluid cultures--NGTD  - Pain control with Oxycodone and morphine  - Zofran for nausea  - will try to discuss with ID as well, possibility of other organisms growing in HIV patient with negative gram stain and culture, possible mycoplasma? - appreciate ortho input, Dr. Tamera Punt was kind enough to discuss case with me on the phone today: low suspicion for infected joint given wbc count negative gram stain and no growth on culture yet, although difficult to assess in HIV patient, but afebrile and no leukocytosis.  CRP could provide more information.  Possibly hemarthrosis given coumadin and INR, since drainage, management would be ice, elevation, and dressing with controlling INR goal closer to 2.  Continue to monitor culture's for now.  Dr. Tamera Punt will try to see patient tomorrow.  2. 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 Moshe Cipro and has a fistula in place that is mature. They have discussed starting dialysis and the patient is still hesitant. He is hopeful for a kidney transplant but we discussed the timeline of listing to transplant and that he would likely not escape the need for dialysis while he waits for a transplant.  - Cr 7.75 today - Continue Calcitriol, Bicarb tabs  - follow up renal as outpatient  3. 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 Will restart today.  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   -resume coumadin per pharmacy   4. HIV: Well controlled at home on his current medications. CD4 count 580 and HIV 1 RNA quant <20 05/19/13.  On home regimen: Sustiva, Epivir, and Retrovir.   -continue home medications  -f/u ID outpatient  5. HTN: on home regimen: norvasc 10mg  qd.  BP on admission  138/89.   -Continue home medication  Norvasc   6. VTE: resume coumadin per pharmacy  Dispo: Disposition is deferred at this time, awaiting improvement of current medical problems. Anticipated discharge in approximately 1-2 day(s).   The patient does have a current PCP [name], MD) and does need an [facility] hospital follow-up appointment after discharge.  The patient does not have transportation limitations that hinder transportation to clinic appointments.  Services Needed at time of discharge: Y = Yes, Blank = No PT:   OT:   RN:   Equipment:   Other:     LOS: 1 day   Jerene Pitch, MD 06/03/2013, 2:50 PM

## 2013-06-04 DIAGNOSIS — B20 Human immunodeficiency virus [HIV] disease: Secondary | ICD-10-CM

## 2013-06-04 DIAGNOSIS — Z7901 Long term (current) use of anticoagulants: Secondary | ICD-10-CM

## 2013-06-04 DIAGNOSIS — M25073 Hemarthrosis, unspecified ankle: Principal | ICD-10-CM

## 2013-06-04 DIAGNOSIS — I10 Essential (primary) hypertension: Secondary | ICD-10-CM

## 2013-06-04 DIAGNOSIS — N186 End stage renal disease: Secondary | ICD-10-CM

## 2013-06-04 LAB — BASIC METABOLIC PANEL
BUN: 73 mg/dL — ABNORMAL HIGH (ref 6–23)
Chloride: 101 mEq/L (ref 96–112)
GFR calc Af Amer: 8 mL/min — ABNORMAL LOW (ref >90)
GFR calc non Af Amer: 7 mL/min — ABNORMAL LOW (ref >90)
Glucose, Bld: 94 mg/dL (ref 70–99)
Potassium: 4.4 mEq/L (ref 3.5–5.1)
Sodium: 137 mEq/L (ref 135–145)

## 2013-06-04 LAB — PROTIME-INR
INR: 1.85 — ABNORMAL HIGH (ref 0.00–1.49)
Prothrombin Time: 20.7 seconds — ABNORMAL HIGH (ref 11.6–15.2)

## 2013-06-04 LAB — CBC WITH DIFFERENTIAL/PLATELET
Basophils Absolute: 0 10*3/uL (ref 0.0–0.1)
Basophils Relative: 0 % (ref 0–1)
Eosinophils Absolute: 0.1 10*3/uL (ref 0.0–0.7)
HCT: 24.8 % — ABNORMAL LOW (ref 39.0–52.0)
Hemoglobin: 8.5 g/dL — ABNORMAL LOW (ref 13.0–17.0)
Lymphocytes Relative: 40 % (ref 12–46)
Monocytes Relative: 9 % (ref 3–12)
Neutro Abs: 1.9 10*3/uL (ref 1.7–7.7)
Neutrophils Relative %: 48 % (ref 43–77)
WBC: 4 10*3/uL (ref 4.0–10.5)

## 2013-06-04 MED ORDER — WARFARIN SODIUM 7.5 MG PO TABS
7.5000 mg | ORAL_TABLET | Freq: Once | ORAL | Status: DC
  Filled 2013-06-04: qty 1

## 2013-06-04 MED ORDER — NEPRO/CARBSTEADY PO LIQD: 237.0000 mL | ORAL | Status: DC

## 2013-06-04 NOTE — Evaluation (Signed)
Physical Therapy Evaluation Patient Details Name: Brian [name] MRN: [id number] DOB: [birth year] Today's Date: 06/04/2013 Time: [id number] PT Time Calculation (min): 35 min  PT Assessment / Plan / Recommendation Clinical Impression  37 y.o. male admitted to [facility] for sudden onset left ankle pain, on anticoagulant.  L ankle s/p aspiration and pt currently recieving antibiotics.  He presents today limited by left ankle pain, but able to mobilize well enought with RW to go home.  He also demonstrated the ability to go up and down stairs (at times the elevator at his apartment doesn't work).  He has no PT f/u needed, but will need a RW for home use.      PT Assessment  Patient needs continued PT services    Follow Up Recommendations  No PT follow up    Does the patient have the potential to tolerate intense rehabilitation     NA  Barriers to Discharge None None    Equipment Recommendations  Rolling walker with 5" wheels    Recommendations for Other Services   none  Frequency Min 5X/week    Precautions / Restrictions Restrictions Weight Bearing Restrictions: No   Pertinent Vitals/Pain 7/10 pain after practicing the stairs, ice pack re-applied, leg elevated and RN made aware.        Mobility  Bed Mobility Bed Mobility: Supine to Sit;Sitting - Scoot to Edge of Bed;Sit to Supine Supine to Sit: 6: Modified independent (Device/Increase time);With rails;HOB elevated Sitting - Scoot to Edge of Bed: 6: Modified independent (Device/Increase time);With rail Sit to Supine: 6: Modified independent (Device/Increase time);With rail;HOB elevated Details for Bed Mobility Assistance: Pt used railing, but I don't feel he was relying on them to get sit<->supine Transfers Transfers: Sit to Stand;Stand to Sit Sit to Stand: 5: Supervision;From elevated surface;With upper extremity assist;From bed;With armrests;From chair/3-in-1 Stand to Sit: 5: Supervision;With upper extremity assist;With armrests;To  chair/3-in-1;To bed Details for Transfer Assistance: supervision for safety due to sore left ankle Ambulation/Gait Ambulation/Gait Assistance: 5: Supervision Ambulation Distance (Feet): 15 Feet (x2) Assistive device: Rolling walker Ambulation/Gait Assistance Details: took steps with limited WB left leg and hopped with NWB L leg (after doing stairs due to pain).   Gait Pattern: Step-to pattern Stairs: Yes Stairs Assistance: 4: Min guard Stair Management Technique: One rail Left;Step to pattern;Forwards;With walker (walker folded and in his right hand) Number of Stairs: 9        PT Diagnosis: Difficulty walking;Abnormality of gait;Generalized weakness;Acute pain  PT Problem List: Decreased strength;Decreased range of motion;Decreased activity tolerance;Decreased balance;Decreased mobility;Decreased knowledge of use of DME;Pain PT Treatment Interventions: DME instruction;Gait training;Stair training;Functional mobility training;Therapeutic activities;Therapeutic exercise;Balance training;Neuromuscular re-education;Patient/family education;Modalities   PT Goals Acute Rehab PT Goals PT Goal Formulation: With patient Time For Goal Achievement: 06/11/13 Potential to Achieve Goals: Good Pt will go Sit to Stand: with modified independence PT Goal: Sit to Stand - Progress: Goal set today Pt will go Stand to Sit: with modified independence PT Goal: Stand to Sit - Progress: Goal set today Pt will Ambulate: 51 - 150 feet;with modified independence PT Goal: Ambulate - Progress: Goal set today Pt will Go Up / Down Stairs: Flight;with modified independence;with rail(s);with rolling walker PT Goal: Up/Down Stairs - Progress: Goal set today  Visit Information  Last PT Received On: 06/04/13 Assistance Needed: +1    Subjective Data  Subjective: Per pt one of the doctors told him to keep it NWB.  Spoke with RN and re-read through chart.  No mention of keeping it  NWB.  Ortho MD stated, "Ice, rest,  slow increase activity as tolerated." Patient Stated Goal: to go home, decreased left ankle pain.     Prior Functioning  Home Living Lives With: Alone Available Help at Discharge: Family;Available PRN/intermittently Type of Home: Apartment Home Access: Elevator;Stairs to enter (when the elvator works (it doesn't always work)) Technical brewer of Steps: 20 Entrance Stairs-Rails: Left Home Layout: One level Home Adaptive Equipment: None Prior Function Level of Independence: Independent Able to Take Stairs?: Yes Driving: No Communication Communication: No difficulties    Cognition  Cognition Arousal/Alertness: Awake/alert Behavior During Therapy: WFL for tasks assessed/performed Overall Cognitive Status: Within Functional Limits for tasks assessed    Extremity/Trunk Assessment Right Upper Extremity Assessment RUE ROM/Strength/Tone: [facility] for tasks assessed Left Upper Extremity Assessment LUE ROM/Strength/Tone: WFL for tasks assessed Right Lower Extremity Assessment RLE ROM/Strength/Tone: WFL for tasks assessed (per pt report bad bil knees "arthritis") Left Lower Extremity Assessment LLE ROM/Strength/Tone: Deficits LLE ROM/Strength/Tone Deficits: ankle 3-/5 due to pain, knee and hip functionally WNL      End of Session PT - End of Session Activity Tolerance: Patient limited by pain Patient left: in bed;with call bell/phone within reach;with nursing in room Nurse Communication: Mobility status;Weight bearing status;Other (comment) (need for RW for discharge.  )    Wells Guiles B. [place], [place], DPT [phone]   06/04/2013, 12:50 PM

## 2013-06-04 NOTE — Progress Notes (Signed)
Subjective: 24 hr events: Coumadin was restarted yesterday. Ceftriaxone was switched to ceftazidime to ensure pseudomonas coverage.  Pt reports that pain is improved this morning and he is having less throbbing. He is still keeping his leg elevated and has not walked although he has been up to the chair. He reports sleeping well last night. No fevers, chills, or other complaints.   Objective: Vital signs in last 24 hours: Filed Vitals:   06/03/13 0815 06/03/13 1356 06/03/13 2142 06/04/13 0610  BP:  130/92 127/84 143/99  Pulse:  97 100 98  Temp:  97.8 F (36.6 C) 97.9 F (36.6 C) 98.2 F (36.8 C)  TempSrc:  Oral Oral Oral  Resp:  18 18 20   Height:      Weight: 84.75 kg (186 lb 13.4 oz)   82.237 kg (181 lb 4.8 oz)  SpO2:  100% 98% 99%   Weight change: 0.381 kg (13.4 oz)  Intake/Output Summary (Last 24 hours) at 06/04/13 1119 Last data filed at 06/04/13 0600  Gross per 24 hour  Intake   1330 ml  Output   1490 ml  Net   -160 ml    Constitutional: Pt with L ankle propped up on bed, sitting in bed in NAD. Alert, cooperative.  Head: Normocephalic and atraumatic  Eyes: EOMI, conjunctivae normal, No scleral icterus.  Cardiovascular: RRR, 4/6 systolic murmur loudest at RUSB, prominent PMI  Pulmonary/Chest: normal respiratory effort, CTAB  Abdominal: Soft. Non-tender, non-distended, bowel sounds are normal, no masses, organomegaly, or guarding present.  Extremities: Left ankle s/p arthrocentesis with tegaderm in place and minimal dried blood underneath. TTP in posterior lateral and posterior medial sides of ankle. No fluid collection or edema appreciated. ROM improved from previous exam with minimal ability to dorsiflex. No noticeable erythema or increased warmth compared to R ankle. DP and PT pulses 2+ bilaterally.  Neurological: A&O x3, cranial nerve II-XII are grossly intact, no focal motor deficit, sensory intact to light touch bilaterally.  Skin: Warm, dry and intact. No visible  rashes.   Lab Results: Basic Metabolic Panel:  Recent Labs Lab 06/02/13 0925 06/03/13 0550 06/04/13 0545  NA 136 137 137  K 5.5* 4.6 4.4  CL 99 102 101  CO2 22 18* 20  GLUCOSE 89 80 94  BUN 73* 73* 73*  CREATININE 7.87* 7.75* 8.28*  CALCIUM 8.2* 8.2* 8.3*  PHOS 6.9*  --   --    Liver Function Tests:  Recent Labs Lab 06/02/13 0925  ALBUMIN 3.8   CBC:  Recent Labs Lab 06/02/13 0925 06/04/13 0545  WBC 7.5 4.0  NEUTROABS 5.4 1.9  HGB 9.1* 8.5*  HCT 26.4* 24.8*  MCV 111.9* 110.2*  PLT 185 128*   Coagulation:  Recent Labs Lab 05/31/13 1518 06/02/13 0933 06/03/13 0550 06/04/13 0545  LABPROT  --   --  22.4* 20.7*  INR 2.70 2.5 2.06* 1.85*   Misc. Labs: Synovial Fluid: Red and turbid, no crystals, WBC 1031, PMNs 37, Lymphs 20, Monos 43, Eos 0  Micro Results: MRSA PCR negative Blood cultures x2 negative x2 days Synovial fluid culture negative x1 day Synovial fluid gram stain with moderate WBC present, both PMN and mononuclear, no organisms seen GC/CT RNA negative  Studies/Results: Dg Fluoro Guide Ndl Plc/bx  06/02/2013   *RADIOLOGY REPORT*  Clinical Data:  Ankle swelling  LEFT ANKLE ASPIRATION UNDER FLUOROSCOPY  Comparison:  05/31/13  Technique: Written informed consent was obtained.  Overlying skin prepped with Betadine, draped in the usual sterile fashion, and  infiltrated locally with buffered Lidocaine.  22 gauge needle advanced to the dorsal medial margin of the left tibiotalor. Next, approximately 6 ml of bloody fluid was aspirated from the ankle joint.  Sample was submitted to laboratory for analysis.  Fluoroscopy Time: 3 minutes and 10 seconds  IMPRESSION: Technically successful left ankle aspiration .   Original Report Authenticated By: Kerby Moors, M.D.   Medications: I have reviewed the patient's current medications. Scheduled Meds: . amLODipine  10 mg Oral Daily  . calcitRIOL  0.25 mcg Oral Daily  . cefTAZidime (FORTAZ)  IV  2 g Intravenous Q24H   . efavirenz  600 mg Oral QHS  . feeding supplement (NEPRO CARB STEADY)  237 mL Oral Q24H  . lamiVUDine  100 mg Oral Daily  . pramipexole  0.25 mg Oral QHS  . sodium bicarbonate  650 mg Oral TID  . sodium chloride  3 mL Intravenous Q12H  . vancomycin  1,000 mg Intravenous Q48H  . warfarin  7.5 mg Oral ONCE-1800  . Warfarin - Pharmacist Dosing Inpatient   Does not apply q1800  . zidovudine  300 mg Oral BID   PRN Meds:.sodium chloride, morphine injection, ondansetron (ZOFRAN) IV, ondansetron, oxyCODONE, oxyCODONE-acetaminophen, sodium chloride   Assessment/Plan: Brian [name] is a 37 year old man with a left ankle swelling that is concerning for septic arthritis.   1. Left ankle swelling: Now s/p arthrocentesis with appropriate tenderness on exam. Gram stain of synovial fluid showing moderate WBC, both PMNs and mononuclear but no organisms. Synovial culture negative x1 day. Blood cultures neg x2 days. INR 1.85 today after resuming coumadin yesterday evening. Pt was evaluated by ortho yesterday. Per ortho, this is most likely a mild hemarthrosis after increased activity. No clinical concern for infection. - Per ortho, continue treating for hemarthrosis and inflammation. Ice, rest, slow increase in activity as tolerated.  - Continue to follow cultures  - Pain control with oxycodone q4hr prn  - Will discontinue Vancomycin and Ceftazidime. - Pt had drop in platelets from 185 to 128, possibly secondary to ceftaz. He will follow-up on Monday in the clinic for labs. - PT consult today. Pt lives alone and is not able to use crutches secondary to AV fistula on R arm. He cannot yet bear weight on his L ankle.  2. 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 Moshe Cipro and has a fistula in place that is mature. They have discussed starting dialysis and but pt is hopeful for a transplant.  - Continue Calcitriol, Bicarb tabs  - Followup appointment with Dr. Moshe Cipro on June 30th     3. 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 INR 1.85 today after resuming coumadin yesterday. - Goal INR 2-3 per pharmacy   4. HIV: Well controlled at home on his current medications.  - Continue efavirenz, lamivudine, zidovudine   5. HTN: BPs overnight 127-143/84-99.  - Continue home amlodipine 10mg  qd    Dispo: Discharge likely today, pending PT evaluation. The patient does have a current PCP [name], MD) and does need an [facility] hospital follow-up appointment after discharge.  The patient does not have transportation limitations that hinder transportation to clinic appointments.   This is a Careers information officer Note.  The care of the patient was discussed with Dr. Eula Fried, Dr. Doug Sou, and Dr. Ellwood Dense and the assessment and plan formulated with their assistance.  Please see their attached note for official documentation of the  daily encounter.   LOS: 2 days   [name], Med Student 06/04/2013, 11:19 AM

## 2013-06-04 NOTE — Progress Notes (Signed)
  I have seen and examined the patient, and reviewed the daily progress note by [name], MS 3 and discussed the care of the patient with them. Please see my progress note from 06/03/2013 for further details regarding assessment and plan.    Signed:  Jerene Pitch, MD 06/04/2013, 8:49 AM

## 2013-06-04 NOTE — Progress Notes (Signed)
Internal Medicine Teaching Service Attending Note Date: 06/04/2013  Patient name: Brian [name]  Medical record number: [id number]  Date of birth: [birth year]   Patient appears much better today. The swelling in his left ankle is down and it almost appears normal. He does complain of pain in his left foot, however, it is much better than before. He does not have any new complaints.    Recent Labs Lab 06/02/13 0925 06/04/13 0545  HGB 9.1* 8.5*  HCT 26.4* 24.8*  WBC 7.5 4.0  PLT 185 128*    Assessment and Plan  Labs reviewed, drop in hgb and platelets noted. Ortho recommendations appreciated. In the light of improving symptoms, at this point, we will dc antibiotics and discharge the patient home with rolling walked per PT advice to avoid putting pressure on his left foot. We will advise ice, rest, and elevation and treat this as hemarthrosis. The patient has an appointment with Dr. Elie Confer on Monday 6/23 when he will repeat his INR check and CBC check.    [name] 06/04/2013, 2:38 PM.

## 2013-06-04 NOTE — Progress Notes (Signed)
Case discussed with Dr. Owens Shark immediately after the resident saw the patient.  We reviewed the resident's history and exam and pertinent patient test results.  I agree with the assessment, diagnosis and plan of care documented in the resident's note.  I agree with Dr. Owens Shark that septic arthritis should be ruled out.  As IR requested reversal of INR, it would be most efficient to admit for an observation period, reverse INR and have the procedure.

## 2013-06-04 NOTE — Progress Notes (Addendum)
I have seen the patient and reviewed the daily progress note by Valora Corporal MS 3 and discussed the care of the patient with them.  See below for documentation of my findings, assessment, and plans.  Subjective: Brian [name] was seen and examined at bedside.  He reports improvement in his pain and swelling again today.  He is able to move his left foot more every day but still has pain with weight bearing.  He was seen by physical therapy today who recommended a rolling walker for home.  Objective: Vital signs in last 24 hours: Filed Vitals:   06/03/13 0815 06/03/13 1356 06/03/13 2142 06/04/13 0610  BP:  130/92 127/84 143/99  Pulse:  97 100 98  Temp:  97.8 F (36.6 C) 97.9 F (36.6 C) 98.2 F (36.8 C)  TempSrc:  Oral Oral Oral  Resp:  18 18 20   Height:      Weight: 186 lb 13.4 oz (84.75 kg)   181 lb 4.8 oz (82.237 kg)  SpO2:  100% 98% 99%   Weight change: 13.4 oz (0.381 kg)  Intake/Output Summary (Last 24 hours) at 06/04/13 1338 Last data filed at 06/04/13 0600  Gross per 24 hour  Intake   1210 ml  Output   1490 ml  Net   -280 ml   Vitals reviewed.  General: resting in bed, foot elevated on pillow, NAD  HEENT: PERRL, EOMI, no scleral icterus  Cardiac: RRR, blowing murmur  Pulm: clear to auscultation bilaterally, no wheezes, rales, or rhonchi  Abd: soft, nontender, nondistended, BS present  Ext: warm and well perfused, resolved left ankle edema, tender to palpation left ankle region, +2dp b/l, left arm fistula with palpable thrill and bruit, good ROM of b/l upper extremities and RLE extremity, slightly limited in ROM of left foot due to pain in ankle but improved dorsi and plantar flexion and rotation.  Neuro: alert and oriented X3, cranial nerves II-XII grossly intact, strength 4/5 LLE due to ankle pain, 5/5 all other extremities, sensation to light touch equal in bilateral upper and lower extremities  Lab Results: Reviewed and documented in Electronic Record Micro  Results: Reviewed and documented in Electronic Record Studies/Results: Reviewed and documented in Electronic Record Medications: I have reviewed the patient's current medications. Scheduled Meds: . amLODipine  10 mg Oral Daily  . calcitRIOL  0.25 mcg Oral Daily  . cefTAZidime (FORTAZ)  IV  2 g Intravenous Q24H  . efavirenz  600 mg Oral QHS  . feeding supplement (NEPRO CARB STEADY)  237 mL Oral Q24H  . lamiVUDine  100 mg Oral Daily  . pramipexole  0.25 mg Oral QHS  . sodium bicarbonate  650 mg Oral TID  . sodium chloride  3 mL Intravenous Q12H  . vancomycin  1,000 mg Intravenous Q48H  . warfarin  7.5 mg Oral ONCE-1800  . Warfarin - Pharmacist Dosing Inpatient   Does not apply q1800  . zidovudine  300 mg Oral BID   Continuous Infusions:  PRN Meds:.sodium chloride, morphine injection, ondansetron (ZOFRAN) IV, ondansetron, oxyCODONE, oxyCODONE-acetaminophen, sodium chloride Assessment/Plan: Brian [name] is a 37 year old man with a left ankle swelling admitted for ? septic arthritis.   1. Left ankle swelling x 4 day history of left ankle swelling that was exquisitely tender to palpation and with any ROM. Likely secondary to hemarthrosis since on coumadin.  Resolved s/p arthrocentesis 06/02/13.  He denies a history of gout or previous injury to the ankle though he states that he  was told he has no cartilage in his ankles from years of skateboarding as a younger man. His father has a history of gout. Differential diagnosis includes septic arthritis (unlikely as per ortho) vs gout (no crystals).  INR on admission 2.5, coumadin held, down to 2.06 today. Arthrocentesis: red, turbid, NO CRYSTALS, 1031 wbc, 27 neutrophils, 20 lymphycytes, and 43 monocyte-macrophages. No organisms seen on gram stain.  - continue IV antibiotics empirically: ceftazidime and vancomycin given immunocompromise with HIV and possible pseudomonas.  - resume coumadin per pharmacy  - f/u synovial fluid cultures 06/02/13--NGTD  -  Pain control with Oxycodone - discussed with Dr. Graylon Good, ID, also does not believe this to be septic joint, agree with current management.   - appreciate ortho input, per Dr. Tamera Punt: likely secondary to mild hemarthrosis not consistent with septic joint.  Recommends ice, rest, and slow increase in activity as tolerated. Follow up cultures -rolling walker per PT  2. 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 Moshe Cipro and has a fistula in place that is mature. They have discussed starting dialysis and the patient is still hesitant. He is hopeful for a kidney transplant but we discussed the timeline of listing to transplant and that he would likely not escape the need for dialysis while he waits for a transplant.  - Cr 7.75 today  - Continue Calcitriol, Bicarb tabs  - follow up renal as outpatient   3. 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 Will restart today. 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  -resume coumadin per pharmacy  -will need to follo  4. HIV: Well controlled at home on his current medications. CD4 count 580 and HIV 1 RNA quant <20 05/19/13. On home regimen: Sustiva, Epivir, and Retrovir.  -continue home medications  -f/u ID outpatient   5. HTN: on home regimen: norvasc 10mg  qd. BP on admission 138/89.  -Continue home medication Norvasc   6. VTE: resume coumadin per pharmacy   Dispo: d/c home today  The patient does have a current PCP [name], MD) and does need an [facility] hospital follow-up appointment after discharge.  The patient does not have transportation limitations that hinder transportation to clinic appointments.   Services Needed at time of discharge: Y = Yes, Blank = No  PT:  Rolling walker  OT:    RN:    Equipment:    Other:       LOS: 2 days   Jerene Pitch, MD 06/04/2013, 1:38 PM
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
## 2013-06-04 NOTE — Progress Notes (Signed)
Discharge Note. All discharge instructions reviewed with pt along with follow up appointments. Pt called to reschedule one of his appointments. Pt reported he feels ready to go home and that he is glad the swelling of his ankle has decreased. Pt worked with PT today was able to go up steps and did not have an increase in swelling during that activity. Pt encouraged to use a tobacco cessation program however pt declined wanting to do that. Pt's paperwork did not have MyChart code listed so this Probation officer called and got a code for pt to be able to access MyChart.  Pt thanked staff for working with him and was discharged home.
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
## 2013-06-05 LAB — BODY FLUID CULTURE: Culture: NO GROWTH

## 2013-06-06 DIAGNOSIS — M25073 Hemarthrosis, unspecified ankle: Secondary | ICD-10-CM | POA: Diagnosis present

## 2013-06-06 NOTE — Discharge Summary (Signed)
Name: Brian [name] MRN: [id number] DOB: 1976/09/13 37 y.o. PCP: [name], MD  Date of Admission: 06/02/2013 10:35 AM Date of Discharge: 06/06/2013 Attending Physician: Dr. Ellwood Dense Discharge Diagnosis:  Principal Problem:   Hemarthrosis involving ankle joint Active Problems:   HIV INFECTION   Long term current use of anticoagulant   ESRD (end stage renal disease)   ESSENTIAL HYPERTENSION  Discharge Medications:   Medication List    TAKE these medications       albuterol 108 (90 BASE) MCG/ACT inhaler  Commonly known as:  PROVENTIL HFA;VENTOLIN HFA  Inhale 2 puffs into the lungs every 6 (six) hours as needed for wheezing.     amLODipine 10 MG tablet  Commonly known as:  NORVASC  Take 1 tablet (10 mg total) by mouth daily.     calcitRIOL 0.25 MCG capsule  Commonly known as:  ROCALTROL  Take 0.25 mcg by mouth daily.     efavirenz 600 MG tablet  Commonly known as:  SUSTIVA  Take 1 tablet (600 mg total) by mouth at bedtime. Take with AZT and 3TC     feeding supplement (NEPRO CARB STEADY) Liqd  Take 237 mLs by mouth daily.     lamivudine 100 MG tablet  Commonly known as:  EPIVIR  Take 1 tablet (100 mg total) by mouth daily.     oxyCODONE-acetaminophen 10-325 MG per tablet  Commonly known as:  PERCOCET  Take 1 tablet by mouth every 8 (eight) hours as needed for pain. For pain     pramipexole 0.25 MG tablet  Commonly known as:  MIRAPEX  Take 1 tablet (0.25 mg total) by mouth at bedtime.     sodium bicarbonate 650 MG tablet  Take 1 tablet (650 mg total) by mouth 3 (three) times daily.     warfarin 5 MG tablet  Commonly known as:  COUMADIN  Take 5-7.5 mg by mouth daily. Takes 7.5mg  on mon, wed and fri each week  Takes 5mg  all other days     zidovudine 300 MG tablet  Commonly known as:  RETROVIR  Take 1 tablet (300 mg total) by mouth 2 (two) times daily.       Disposition and follow-up:   [name] was discharged from [facility]  in Stable condition.  At the hospital follow up visit please address:  1.  Anemia--Hb down to 8.5 on discharge from 10.4 two weeks prior.  Platelets down to 128 on discharge. 2.  Hemarthrosis L ankle--monitor for swelling or worsening of pain.  Consider cam boot if still pain while walking with rolling walker support per PT.  3.  HIV--follow up with ID clinic  4.  CKD with hx of polycystic kidney disease--follow up with Dr. Moshe Cipro  2.  Labs / imaging needed at time of follow-up: cbc, renal function  3.  Pending labs/ test needing follow-up: blood cx x2 from 06/02/13--NGTD  Follow-up Appointments:     Follow-up Information   Follow up with [name], [facility] On 06/07/2013. (@11am , and have cbc checked)    Contact information:   [name] - [place] [address] [phone]       Follow up with [name], MD On 06/11/2013. (@145pm )    Contact information:   [address] [place] [address] [phone]       Schedule an appointment as soon as possible for a visit with GOLDSBOROUGH,KELLIE A, MD.   Contact information:   [address]  [place] [address] [phone]       Schedule an appointment as soon as possible for a visit with Scharlene Gloss, MD.   Contact information:   [place]. [place] [address] [phone]      Discharge Instructions: Discharge Orders   Future Appointments Provider Department Dept Phone   06/07/2013 11:00 AM Imp-Imcr Lab [place] [phone]   06/07/2013 11:30 AM Imp-Imcr Coumadin [place] [phone]   06/10/2013 1:30 PM Madilyn Hook, DO [facility] [place]   06/11/2013 1:45 PM [name], MD [facility] [phone]   Future Orders Complete By Expires     Diet - low sodium heart healthy  As directed     Increase activity slowly  As directed     Comments:      Use rolling walker    No wound care  As  directed     Comments:      Ice to ankle, rest, and increase activity slowly as tolerated      Consultations: Treatment Team:  Nita Sells, MD orthopedics  Procedures Performed:  Ct Abdomen Pelvis Wo Contrast  05/19/2013   *RADIOLOGY REPORT*  Clinical Data: Right-sided flank pain, history of polycystic kidney disease.  CT ABDOMEN AND PELVIS WITHOUT CONTRAST  Technique:  Multidetector CT imaging of the abdomen and pelvis was performed following the standard protocol without intravenous contrast.  Comparison: 03/22/2012  Findings: Minimal dependent bibasilar atelectasis.  No pleural effusion.  Small pericardial effusion is noted.  Redemonstrated are innumerable hepatic and renal cysts with diffuse renal enlargement of the areas of internal hyperdensity with and within some of the cysts indicating hemorrhage.  Adrenal glands are not well visualized appear unremarkable their visualized aspects.  Spleen, pancreas, and gallbladder are normal.  No radiopaque renal or ureteral calculus is identified.  There is a small amount of right perinephric fluid is difficult to measure accurately due to slice selection differences in volume averaging but measures approximately 26 HU.  Since the prior exam, many of the cysts have developed internal hyperdensity and there is no single cyst but can be identified as a newly dominant hypodense lesion due to multiplicity of findings.  No hydronephrosis.  No bowel wall thickening or focal segmental dilatation.  The appendix is not identified there is no secondary evidence for acute appendicitis.  Trace pelvic free fluid is present.  No acute osseous abnormality.  IMPRESSION: Innumerable hepatic and renal cysts compatible with the provided history of polycystic kidney disease.  Multiple renal cysts have developed internal hyperdensity as compared to the prior study, suggesting internal hemorrhage.  There is no new dominant lesion on either side that can be distinguished  from other surrounding lesions.  There is new right perinephric fluid which measures just higher than expected for simple fluid, and it is possible the patient's right flank pain is due to cyst rupture or less likely forniceal rupture in the absence of urothelial system dilatation.  No radiopaque renal or ureteral calculus.   Original Report Authenticated By: Conchita Paris, M.D.   Dg Ankle Complete Left  05/31/2013   *RADIOLOGY REPORT*  Clinical Data: 37 year old male with left ankle pain and swelling. No known injury.  LEFT ANKLE COMPLETE - 3+ VIEW  Comparison: Left foot series 02/23/2011.  Findings: Positive joint effusion.  Calcaneus appears stable and intact.  The mortise view is oblique.  Mortise joint alignment within normal limits.  Talar dome intact.  No acute fracture identified.  IMPRESSION: Positive for ankle joint effusion. No acute fracture or dislocation identified about the left ankle.   Original Report Authenticated By: Roselyn Reef, M.D.   Dg Fluoro Guide Ndl Plc/bx  06/02/2013   *RADIOLOGY REPORT*  Clinical Data:  Ankle swelling  LEFT ANKLE ASPIRATION UNDER FLUOROSCOPY  Comparison:  05/31/13  Technique: Written informed consent was obtained.  Overlying skin prepped with Betadine, draped in the usual sterile fashion, and infiltrated locally with buffered Lidocaine.  22 gauge needle advanced to the dorsal medial margin of the left tibiotalor. Next, approximately 6 ml of bloody fluid was aspirated from the ankle joint.  Sample was submitted to laboratory for analysis.  Fluoroscopy Time: 3 minutes and 10 seconds  IMPRESSION: Technically successful left ankle aspiration .   Original Report Authenticated By: Kerby Moors, M.D.   Admission HPI: Mr. Lundie is a 37 year old man who presented to the [facility] today with a complaint of 4 days of left ankle swelling and pain. He states that he noticed a rather sudden onset of left ankle pain and swelling 4 days ago after walking about 2 miles the day prior. He  denies any trauma to the ankle, twisting, or pain prior to or during the walk. He has never had anything like this before but has a family history of Gout in his father. He states that he was told he had a mild fever 2 days ago when he initially presented to clinic with a documented temperature of 32F. He was initially going to have an arthrocentesis done by IR yesterday but they refused the day of because of his INR of 2.7 the day prior. He returned today after holding his coumadin last night.  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 He is followed by Dr. Moshe Cipro and they are discussing the initiation of dialysis. He states that he has an appointment on the 30th of June at [facility] for evaluation for transplant listing.   Hospital Course by problem list:   Hemarthrosis involving L ankle joint--improved.  Presented with left ankle swelling x 4 day that was tender to palpation and with any ROM. Likely secondary to hemarthrosis since on coumadin. Resolved swelling and improved pain during hospital course including s/p arthrocentesis 06/02/13.  Arthrocentesis: red, turbid, NO CRYSTALS, 1031 wbc, 27 neutrophils, 20 lymphycytes, and 43 monocyte-macrophages. No organisms seen on gram stain. He denied a history of gout or previous injury to the ankle though he states that he was told he has no cartilage in his ankles from years of skateboarding as a younger man. His father has a history of gout. Orthopedics was consulted, Dr. Ann Held, thought to likely not be septic arthritis.  Synovial fluid from left ankle culture showed no growth x3 days.  No crystal seen on fluid analysis suggestive of gout.  INR on admission was 2.5 and coumadin was initially held then resumed during hospital course.  Initially, he was started on empirical treatment with Ceftazidime and Vancomycin and then transitioned off all antibiotics.  Pain was controlled during admission.  Will need ice to area, rest,  and slow increase in activity as tolerated with assistance of rolling walker (per PT) as per recommendations by ortho.  Will need to follow up with pcp.      End stage renal disease not yet on dialysis: Worsening renal function.  Cr 8.28 on discharge.  He is followed  by Dr. Moshe Cipro and has a fistula in place that is mature. Hx of polycystic kidney disease.  They have apparently discussed starting dialysis and the patient is still hesitant. He is hopeful for a kidney transplant.  Continued on home medicatins including  Calcitriol and Bicarb tabs with follow up advised with renal and pcp.      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 Follow up with coumadin clinic and with PCP.      HIV: Well controlled at home on his current medications. CD4 count 580 and HIV 1 RNA quant <20 05/19/13. On home regimen: Sustiva, Epivir, and Retrovir. Follows with Dr. Linus Salmons as an outpatient.  Recommended to follow up with pcp and ID clinic.       HTN: on home regimen: norvasc 10mg  qd. BP on admission 138/89. Continued on home medication during admission and hospital course.  Follow up with PCP.  Hx of polycystic kidney disease.    Discharge Vitals:   BP 122/81  Pulse 98  Temp(Src) 98.2 F (36.8 C) (Oral)  Resp 18  Ht 6\' 3"  (1.905 m)  Wt 181 lb 4.8 oz (82.237 kg)  BMI 22.66 kg/m2  SpO2 98%  Discharge Labs:  Basic Metabolic Panel:  Recent Labs Lab 06/02/13 0925 06/03/13 0550 06/04/13 0545  NA 136 137 137  K 5.5* 4.6 4.4  CL 99 102 101  CO2 22 18* 20  GLUCOSE 89 80 94  BUN 73* 73* 73*  CREATININE 7.87* 7.75* 8.28*  CALCIUM 8.2* 8.2* 8.3*  PHOS 6.9*  --   --    Liver Function Tests:  Recent Labs Lab 06/02/13 0925  ALBUMIN 3.8   CBC:  Recent Labs Lab 06/02/13 0925 06/04/13 0545  WBC 7.5 4.0  NEUTROABS 5.4 1.9  HGB 9.1* 8.5*  HCT 26.4* 24.8*  MCV 111.9* 110.2*  PLT 185 128*     Coagulation:  Recent Labs Lab 05/31/13 1518 06/02/13 0933 06/03/13 0550 06/04/13 0545  LABPROT  --   --  22.4* 20.7*  INR 2.70 2.5 2.06* 1.85*   Urine Drug Screen: Drugs of Abuse     Component Value Date/Time   LABOPIA NEG 03/26/2013 1519   LABOPIA POSITIVE* 09/22/2010 1140   COCAINSCRNUR NEG 03/26/2013 1519   COCAINSCRNUR NONE DETECTED 09/22/2010 1140   LABBENZ NEG 03/26/2013 1519   LABBENZ NEG 07/30/2011 1656   LABBENZ POSITIVE* 09/22/2010 1140   AMPHETMU NEG 07/30/2011 1656   AMPHETMU NONE DETECTED 09/22/2010 1140   THCU NEG 03/26/2013 1519   THCU NONE DETECTED 09/22/2010 1140   LABBARB NEG 03/26/2013 1519   LABBARB  Value: NONE DETECTED        DRUG SCREEN FOR MEDICAL PURPOSES ONLY.  IF CONFIRMATION IS NEEDED FOR ANY PURPOSE, NOTIFY LAB WITHIN 5 DAYS.        LOWEST DETECTABLE LIMITS FOR URINE DRUG SCREEN Drug Class       Cutoff (ng/mL) Amphetamine      1000 Barbiturate      200 Benzodiazepine   [id number] Tricyclics       [id number] Opiates          300 Cocaine          300 THC              50 09/22/2010 1140    Signed: Jerene Pitch, MD 06/06/2013, 2:22 PM   Time Spent on Discharge: 35 minutes Services  Ordered on Discharge: none Equipment Ordered on Discharge: rolling walker

## 2013-06-07 ENCOUNTER — Ambulatory Visit (INDEPENDENT_AMBULATORY_CARE_PROVIDER_SITE_OTHER): Payer: Medicaid Other | Admitting: Pharmacist

## 2013-06-07 ENCOUNTER — Other Ambulatory Visit (INDEPENDENT_AMBULATORY_CARE_PROVIDER_SITE_OTHER): Payer: Medicaid Other

## 2013-06-07 DIAGNOSIS — Z7901 Long term (current) use of anticoagulants: Secondary | ICD-10-CM

## 2013-06-07 DIAGNOSIS — N186 End stage renal disease: Secondary | ICD-10-CM

## 2013-06-07 LAB — CBC WITH DIFFERENTIAL/PLATELET
HCT: 24.4 % — ABNORMAL LOW (ref 39.0–52.0)
Hemoglobin: 8.5 g/dL — ABNORMAL LOW (ref 13.0–17.0)
Lymphocytes Relative: 26 % (ref 12–46)
Lymphs Abs: 1.8 10*3/uL (ref 0.7–4.0)
MCHC: 34.8 g/dL (ref 30.0–36.0)
Monocytes Absolute: 0.3 10*3/uL (ref 0.1–1.0)
Monocytes Relative: 5 % (ref 3–12)
Neutro Abs: 4.7 10*3/uL (ref 1.7–7.7)
Neutrophils Relative %: 68 % (ref 43–77)
RBC: 2.25 MIL/uL — ABNORMAL LOW (ref 4.22–5.81)

## 2013-06-07 NOTE — Patient Instructions (Signed)
Patient instructed to take medications as defined in the Anti-coagulation Track section of this encounter.  Patient instructed to take today's dose.  Patient verbalized understanding of these instructions.    

## 2013-06-07 NOTE — Progress Notes (Signed)
Anti-Coagulation Progress Note  Brian [name] is a 37 y.o. male who is currently on an anti-coagulation regimen.    RECENT RESULTS: Recent results are below, the most recent result is correlated with a dose of 45 mg. per week: Lab Results  Component Value Date   INR 2.30 06/07/2013   INR 1.85* 06/04/2013   INR 2.06* 06/03/2013    ANTI-COAG DOSE: Anticoagulation Dose Instructions as of 06/07/2013     Dorene Grebe Tue Wed Thu Fri Sat   New Dose 7.5 mg 5 mg 7.5 mg 5 mg 7.5 mg 5 mg 7.5 mg       ANTICOAG SUMMARY: Anticoagulation Episode Summary   Current INR goal    Next INR check 06/14/2013   INR from last check 2.30 (06/07/2013)   Weekly max dose    Target end date    INR check location    Preferred lab    Send INR reminders to       Comments         ANTICOAG TODAY: Anticoagulation Summary as of 06/07/2013   INR goal    Selected INR 2.30 (06/07/2013)   Next INR check 06/14/2013   Target end date     Anticoagulation Episode Summary   INR check location    Preferred lab    Send INR reminders to    Comments       PATIENT INSTRUCTIONS: Patient Instructions  Patient instructed to take medications as defined in the Anti-coagulation Track section of this encounter.  Patient instructed to take today's dose.  Patient verbalized understanding of these instructions.       FOLLOW-UP Return in 7 days (on 06/14/2013) for Follow up INR at [place], [facility] Pharm.D., CACP

## 2013-06-08 LAB — RENAL FUNCTION PANEL
BUN: 68 mg/dL — ABNORMAL HIGH (ref 6–23)
CO2: 26 mEq/L (ref 19–32)
Calcium: 8.7 mg/dL (ref 8.4–10.5)
Chloride: 98 mEq/L (ref 96–112)
Glucose, Bld: 96 mg/dL (ref 70–99)
Potassium: 4.7 mEq/L (ref 3.5–5.3)
Sodium: 135 mEq/L (ref 135–145)

## 2013-06-08 LAB — CULTURE, BLOOD (ROUTINE X 2)
Culture: NO GROWTH
Culture: NO GROWTH

## 2013-06-08 NOTE — Discharge Summary (Signed)
Internal Medicine Teaching Service Attending Note Date: 06/08/2013  Patient name: Brian [name]  Medical record number: [id number]  Date of birth: 1976-08-05    I evaluated the patient on the day of discharge and discussed the discharge plan with my resident team. I agree with the discharge documentation and disposition.    Thanks [name] 06/08/2013, 8:56 AM

## 2013-06-08 NOTE — Progress Notes (Signed)
I have reviewed Dr. Gladstone Pih note and agree with his plan.  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

## 2013-06-10 ENCOUNTER — Ambulatory Visit (INDEPENDENT_AMBULATORY_CARE_PROVIDER_SITE_OTHER): Payer: Self-pay | Admitting: General Surgery

## 2013-06-11 ENCOUNTER — Ambulatory Visit (INDEPENDENT_AMBULATORY_CARE_PROVIDER_SITE_OTHER): Payer: Medicaid Other | Admitting: Internal Medicine

## 2013-06-11 ENCOUNTER — Encounter: Payer: Self-pay | Admitting: Internal Medicine

## 2013-06-11 ENCOUNTER — Ambulatory Visit: Payer: Medicaid Other

## 2013-06-11 VITALS — BP 129/73 | HR 101 | Temp 98.3°F | Ht 74.0 in | Wt 183.8 lb

## 2013-06-11 DIAGNOSIS — Z09 Encounter for follow-up examination after completed treatment for conditions other than malignant neoplasm: Secondary | ICD-10-CM

## 2013-06-11 DIAGNOSIS — Q613 Polycystic kidney, unspecified: Secondary | ICD-10-CM

## 2013-06-11 DIAGNOSIS — M25072 Hemarthrosis, left ankle: Secondary | ICD-10-CM

## 2013-06-11 DIAGNOSIS — M25073 Hemarthrosis, unspecified ankle: Secondary | ICD-10-CM

## 2013-06-11 NOTE — Progress Notes (Signed)
Subjective:   Patient ID: Brian [name] male   DOB: 1976/12/16 37 y.o.   MRN: [id number]  HPI: [name] is a 37 y.o. past mental history of polycystic kidney disease, long-term anticoagulation for unprovoked DVTs, hypertension, and HIV who presents for hospital followup regarding ankle swelling. Patient feels that he left confused and was not told the results of the synovial culture and therefore he was concerned that he had an infection and needed antibiotics. Patient denied any recurrent fevers or chills increasing pain or inability to ambulate on the left ankle since his discharge. Patient has been elevating his ankle and noticed decreased swelling no gross discharge or erythema.    Past Medical History  Diagnosis Date  . Hypertension   . Alcohol abuse   . Tobacco abuse   . Pulmonary nodule 10/11    Repeat CT Angio 01/2011>>Resolution of previously seen bibasilar pulmonary nodules.  These may have been related to some degree of pulmonary infarct given the large burden of pulmonary emboli seen previously  . Thoracic aortic aneurysm 10/11    4cm fusiform aneurysm in ascending aorta found on evaluation during hospitalization (10/11)  Repeat CT Angio 2/12>> Stable dilatation of the ascending aorta when compared to the prior exam. Patient will be due for yearly CT 01/2012  . Pulmonary embolism 10/11    Provoked 2/2 Brian [name]. Hypercoag panel negative. Will receive 6 months anticoagulation.  Had prior provoked PE in 2004.  [name] HIV (human immunodeficiency virus infection) 4/11  . Anemia   . Thyroid disease   . DVT, lower extremity early 2000's    left  . Neuromuscular disorder   . Chronic back pain     "crushed vertebra  in upper back; pinched nerve in lower back S/P Brian [name] age 45"  . Depression   . Shortness of breath on exertion     "sometimes laying down also"  . Chronic kidney disease, stage IV (severe)     02/17/12 "no dialysis yet"  . Polycystic kidney disease   . Pneumonia   . H/O  hiatal hernia   . Anxiety   . THORACIC AORTIC ANEURYSM 10/01/2010   Current Outpatient Prescriptions  Medication Sig Dispense Refill  . albuterol (PROVENTIL HFA;VENTOLIN HFA) 108 (90 BASE) MCG/ACT inhaler Inhale 2 puffs into the lungs every 6 (six) hours as needed for wheezing.  1 Inhaler  2  . amLODipine (NORVASC) 10 MG tablet Take 1 tablet (10 mg total) by mouth daily.  60 tablet  4  . calcitRIOL (ROCALTROL) 0.25 MCG capsule Take 0.25 mcg by mouth daily.      [name] efavirenz (SUSTIVA) 600 MG tablet Take 1 tablet (600 mg total) by mouth at bedtime. Take with AZT and 3TC  30 tablet  11  . lamivudine (EPIVIR) 100 MG tablet Take 1 tablet (100 mg total) by mouth daily.  30 tablet  11  . Nutritional Supplements (FEEDING SUPPLEMENT, NEPRO CARB STEADY,) LIQD Take 237 mLs by mouth daily.    0  . oxyCODONE-acetaminophen (PERCOCET) 10-325 MG per tablet Take 1 tablet by mouth every 8 (eight) hours as needed for pain. For pain  90 tablet  0  . pramipexole (MIRAPEX) 0.25 MG tablet Take 1 tablet (0.25 mg total) by mouth at bedtime.  30 tablet  2  . sodium bicarbonate 650 MG tablet Take 1 tablet (650 mg total) by mouth 3 (three) times daily.  90 tablet  0  . warfarin (COUMADIN) 5 MG tablet Take 5-7.5 mg by mouth  daily. Takes 7.5mg  on mon, wed and fri each week Takes 5mg  all other days      . zidovudine (RETROVIR) 300 MG tablet Take 1 tablet (300 mg total) by mouth 2 (two) times daily.  60 tablet  11  . [DISCONTINUED] carvedilol (COREG) 25 MG tablet Take 1 tablet (25 mg total) by mouth 2 (two) times daily with a meal.  60 tablet  0   No current facility-administered medications for this visit.   Family History  Problem Relation Age of Onset  . Coronary artery disease Mother   . Malignant hyperthermia Mother   . Heart disease Mother   . Hyperlipidemia Mother   . Hypertension Mother   . Sleep apnea Father   . Malignant hyperthermia Father   . Diabetes Father   . Hyperlipidemia Father   . Hypertension  Father   . Heart attack Maternal Grandmother   . Malignant hyperthermia Sister   . Malignant hyperthermia Brother    History   Social History  . Marital Status: Single    Spouse Name: N/A    Number of Children: N/A  . Years of Education: N/A   Occupational History  .     Social History Main Topics  . Smoking status: Current Every Day Smoker -- 0.50 packs/day for 24 years    Types: Cigarettes  . Smokeless tobacco: Never Used     Comment: as tried to quit. Still trying.  Nothing has helped so far  . Alcohol Use: 0.0 oz/week     Comment: 02/17/12 Used to drink a gallon of vodka/day but cut down to 2-3 shots/month  . Drug Use: Yes    Special: Marijuana  . Sexually Active: Not Currently    Birth Control/ Protection: Condom     Comment: pt. given condoms   Other Topics Concern  . None   Social History Narrative   [employer] apprv til 03/16/11   Fax labs to [name])  November 23, 2010 2:20 PM      [name] benefits approved: patient eligible for 100% discount for out patient labs and office visits.              Patient eligible for 100% discount for other services.   Financial assistance approved for 100% discount at [facility] and has [facility] card   [facility]  July 09, 2010 6:10 PM      [name]  July 05, 2010 3:08 PM      PT SAYS OK TO GIVE INFORMATION AND SPEAK TO Brian [name] [name], IN REFERENCE TO MEDICAL CARE.  EFFECTIVE 10-01-10 [name]      Applied for disability, is appealing denial   Review of Systems: Constitutional: Denies fever, chills, diaphoresis, appetite change and fatigue.  Respiratory: Denies SOB, DOE, cough, chest tightness,  and wheezing.   Cardiovascular: Denies chest pain, palpitations and leg swelling.  Musculoskeletal: Denies myalgias, back pain, joint swelling, arthralgias and gait problem.  Skin: Denies pallor, rash and wound.  Neurological: Denies dizziness, seizures, syncope, weakness,  light-headedness, numbness and headaches.  Hematological: Denies adenopathy. Easy bruising, personal or family bleeding history    Objective:  Physical Exam: Filed Vitals:   06/11/13 1335  BP: 129/73  Pulse: 101  Temp: 98.3 F (36.8 C)  TempSrc: Oral  Height: 6\' 2"  (1.88 m)  Weight: 183 lb 12.8 oz (83.371 kg)  SpO2: 98%   General: sitting in chair, comfortable HEENT: PERRL, EOMI, no scleral  icterus Cardiac: RRR, no rubs, murmurs or gallops Pulm: clear to auscultation bilaterally, moving normal volumes of air Abd: soft, nontender, nondistended, BS present Ext: warm and well perfused, no pedal edema, left ankle slightly swollen, no visible gross bleeding, tegaderm covering puncture site w/o erythema/drainage well healing granulation tissue Neuro: alert and oriented X3, cranial nerves II-XII grossly intact   Assessment & Plan:  1. Left ankle hemarthrosis: Pt is greatly improving symptomatically and on physical exam today. Patient has no signs of active infection some Bactroban was given.  2. PCKD: patient has his followup with [place] [facility] for kidney transplant evaluation on 06/14/13. Pt already has established care with [facility] urology and nephrology where recs for starting HD have been made. Pt is very reluctant at this point and has high anxiety regarding HD and surgery.  Pt was discussed with Dr. Lynnae January

## 2013-06-14 ENCOUNTER — Ambulatory Visit: Payer: Medicaid Other

## 2013-06-14 DIAGNOSIS — K469 Unspecified abdominal hernia without obstruction or gangrene: Secondary | ICD-10-CM | POA: Insufficient documentation

## 2013-06-14 DIAGNOSIS — Z7682 Awaiting organ transplant status: Secondary | ICD-10-CM | POA: Insufficient documentation

## 2013-06-14 DIAGNOSIS — I719 Aortic aneurysm of unspecified site, without rupture: Secondary | ICD-10-CM | POA: Insufficient documentation

## 2013-06-14 NOTE — Progress Notes (Signed)
Case discussed with Dr. Sadek soon after the resident saw the patient.  We reviewed the resident's history and exam and pertinent patient test results.  I agree with the assessment, diagnosis, and plan of care documented in the resident's note. 

## 2013-06-15 ENCOUNTER — Other Ambulatory Visit: Payer: Self-pay | Admitting: Internal Medicine

## 2013-06-15 DIAGNOSIS — F17200 Nicotine dependence, unspecified, uncomplicated: Secondary | ICD-10-CM | POA: Insufficient documentation

## 2013-06-30 ENCOUNTER — Other Ambulatory Visit: Payer: Self-pay | Admitting: *Deleted

## 2013-06-30 MED ORDER — OXYCODONE-ACETAMINOPHEN 10-325 MG PO TABS: 1.0000 | ORAL_TABLET | Freq: Three times a day (TID) | ORAL | Status: DC | PRN

## 2013-06-30 NOTE — Telephone Encounter (Signed)
Last filled 6/16 Call when ready @ [phone]

## 2013-07-01 NOTE — Telephone Encounter (Signed)
Rx ready, pt informed

## 2013-07-05 ENCOUNTER — Ambulatory Visit: Payer: Medicaid Other

## 2013-07-08 ENCOUNTER — Ambulatory Visit (INDEPENDENT_AMBULATORY_CARE_PROVIDER_SITE_OTHER): Payer: Medicaid Other | Admitting: General Surgery

## 2013-07-09 NOTE — Addendum Note (Signed)
Addended by: Hulan Fray on: 07/09/2013 03:45 PM   Modules accepted: Orders

## 2013-07-12 ENCOUNTER — Ambulatory Visit (INDEPENDENT_AMBULATORY_CARE_PROVIDER_SITE_OTHER): Payer: Medicaid Other | Admitting: Pharmacist

## 2013-07-12 DIAGNOSIS — Z7901 Long term (current) use of anticoagulants: Secondary | ICD-10-CM

## 2013-07-12 LAB — POCT INR: INR: 2.6

## 2013-07-12 NOTE — Patient Instructions (Signed)
Patient instructed to take medications as defined in the Anti-coagulation Track section of this encounter.  Patient instructed to take today's dose.  Patient verbalized understanding of these instructions.    

## 2013-07-12 NOTE — Progress Notes (Signed)
Anti-Coagulation Progress Note  Brian [name] is a 37 y.o. male who is currently on an anti-coagulation regimen.    RECENT RESULTS: Recent results are below, the most recent result is correlated with a dose of 45 mg. per week: Lab Results  Component Value Date   INR 2.60 07/12/2013   INR 2.30 06/07/2013   INR 1.85* 06/04/2013    ANTI-COAG DOSE: Anticoagulation Dose Instructions as of 07/12/2013     Dorene Grebe Tue Wed Thu Fri Sat   New Dose 7.5 mg 5 mg 7.5 mg 5 mg 7.5 mg 5 mg 7.5 mg       ANTICOAG SUMMARY: Anticoagulation Episode Summary   Current INR goal    Next INR check 08/09/2013   INR from last check 2.60 (07/12/2013)   Weekly max dose    Target end date    INR check location    Preferred lab    Send INR reminders to       Comments         ANTICOAG TODAY: Anticoagulation Summary as of 07/12/2013   INR goal    Selected INR 2.60 (07/12/2013)   Next INR check 08/09/2013   Target end date     Anticoagulation Episode Summary   INR check location    Preferred lab    Send INR reminders to    Comments       PATIENT INSTRUCTIONS: Patient Instructions  Patient instructed to take medications as defined in the Anti-coagulation Track section of this encounter.  Patient instructed to take today's dose.  Patient verbalized understanding of these instructions.       FOLLOW-UP Return in 4 weeks (on 08/09/2013) for Follow up INR at 2:30PM.  Jorene Guest, III Pharm.D., CACP  45

## 2013-07-20 ENCOUNTER — Ambulatory Visit (INDEPENDENT_AMBULATORY_CARE_PROVIDER_SITE_OTHER): Payer: Medicaid Other | Admitting: Internal Medicine

## 2013-07-20 ENCOUNTER — Encounter: Payer: Self-pay | Admitting: Internal Medicine

## 2013-07-20 VITALS — BP 120/77 | HR 109 | Temp 98.3°F | Ht 75.0 in | Wt 187.0 lb

## 2013-07-20 DIAGNOSIS — B20 Human immunodeficiency virus [HIV] disease: Secondary | ICD-10-CM

## 2013-07-20 DIAGNOSIS — K649 Unspecified hemorrhoids: Secondary | ICD-10-CM

## 2013-07-20 MED ORDER — HYDROCORTISONE ACETATE 25 MG RE SUPP: 25.0000 mg | Freq: Two times a day (BID) | RECTAL | Status: DC | PRN

## 2013-07-20 MED ORDER — LAMIVUDINE 100 MG PO TABS: 50.0000 mg | ORAL_TABLET | Freq: Every day | ORAL | Status: DC

## 2013-07-20 MED ORDER — ZIDOVUDINE 300 MG PO TABS: 300.0000 mg | ORAL_TABLET | Freq: Every day | ORAL | Status: DC

## 2013-07-20 NOTE — Progress Notes (Signed)
  Subjective:    Patient ID: Brian [name], male    DOB: 1976/03/05, 36 y.o.   MRN: [id number]  HPI He comes in for routine followup. He has missed previous visit but otherwise is doing well. He is now seriously considering dialysis and is planning to start in the next month. He has been hospitalized to 2 ankle swelling but no other new issues. He does have multiple complaints likely related to his kidneys. Some weight loss, no diarrhea. He does have a bump on his peri rectal area   Review of Systems  Constitutional: Positive for fatigue. Negative for fever.  HENT: Negative for sore throat and trouble swallowing.   Respiratory: Negative for cough and shortness of breath.   Cardiovascular: Positive for leg swelling. Negative for chest pain.  Gastrointestinal: Negative for nausea and diarrhea.  Genitourinary:       Bump on rectum  Musculoskeletal: Negative for myalgias and arthralgias.  Skin: Negative for rash.  Neurological: Negative for dizziness, light-headedness and headaches.  Hematological: Negative for adenopathy.  Psychiatric/Behavioral: Negative for dysphoric mood.       Objective:   Physical Exam        Assessment & Plan:

## 2013-07-20 NOTE — Assessment & Plan Note (Signed)
His perirectal bumps did not look like warts, more consistent with hemorrhoids. I will give him an Anusol suppository to

## 2013-07-20 NOTE — Assessment & Plan Note (Signed)
He is doing well with his HIV. He will need to change his medication doses once he does start dialysis by changing his zidovudine to 300 mg daily and lamivudine to 50 mg daily. He is going to call when he does start and I will send his prescriptions empirically to the pharmacy.

## 2013-07-29 ENCOUNTER — Ambulatory Visit (INDEPENDENT_AMBULATORY_CARE_PROVIDER_SITE_OTHER): Payer: Medicaid Other | Admitting: Internal Medicine

## 2013-07-29 ENCOUNTER — Encounter: Payer: Self-pay | Admitting: Internal Medicine

## 2013-07-29 ENCOUNTER — Inpatient Hospital Stay (HOSPITAL_COMMUNITY)
Admission: AD | Admit: 2013-07-29 | Discharge: 2013-08-07 | DRG: 974 | Disposition: A | Discharge status: Home / Self Care | Payer: Medicaid Other | Source: Ambulatory Visit | Attending: Internal Medicine | Admitting: Internal Medicine

## 2013-07-29 ENCOUNTER — Ambulatory Visit (HOSPITAL_COMMUNITY)
Admission: RE | Admit: 2013-07-29 | Discharge: 2013-07-29 | Disposition: A | Discharge status: Home / Self Care | Payer: Medicaid Other | Source: Ambulatory Visit | Attending: Internal Medicine | Admitting: Internal Medicine

## 2013-07-29 ENCOUNTER — Telehealth: Payer: Self-pay | Admitting: *Deleted

## 2013-07-29 VITALS — BP 128/75 | HR 118 | Temp 99.1°F | Ht 74.0 in | Wt 190.8 lb

## 2013-07-29 DIAGNOSIS — N179 Acute kidney failure, unspecified: Secondary | ICD-10-CM | POA: Diagnosis present

## 2013-07-29 DIAGNOSIS — E872 Acidosis, unspecified: Secondary | ICD-10-CM

## 2013-07-29 DIAGNOSIS — K922 Gastrointestinal hemorrhage, unspecified: Secondary | ICD-10-CM | POA: Diagnosis present

## 2013-07-29 DIAGNOSIS — J069 Acute upper respiratory infection, unspecified: Secondary | ICD-10-CM

## 2013-07-29 DIAGNOSIS — I120 Hypertensive chronic kidney disease with stage 5 chronic kidney disease or end stage renal disease: Secondary | ICD-10-CM | POA: Diagnosis present

## 2013-07-29 DIAGNOSIS — Z515 Encounter for palliative care: Secondary | ICD-10-CM

## 2013-07-29 DIAGNOSIS — D599 Acquired hemolytic anemia, unspecified: Secondary | ICD-10-CM | POA: Diagnosis present

## 2013-07-29 DIAGNOSIS — K649 Unspecified hemorrhoids: Secondary | ICD-10-CM | POA: Diagnosis present

## 2013-07-29 DIAGNOSIS — B199 Unspecified viral hepatitis without hepatic coma: Secondary | ICD-10-CM | POA: Diagnosis present

## 2013-07-29 DIAGNOSIS — R042 Hemoptysis: Secondary | ICD-10-CM

## 2013-07-29 DIAGNOSIS — K469 Unspecified abdominal hernia without obstruction or gangrene: Secondary | ICD-10-CM

## 2013-07-29 DIAGNOSIS — E871 Hypo-osmolality and hyponatremia: Secondary | ICD-10-CM

## 2013-07-29 DIAGNOSIS — D61811 Other drug-induced pancytopenia: Secondary | ICD-10-CM

## 2013-07-29 DIAGNOSIS — D61818 Other pancytopenia: Secondary | ICD-10-CM

## 2013-07-29 DIAGNOSIS — I10 Essential (primary) hypertension: Secondary | ICD-10-CM

## 2013-07-29 DIAGNOSIS — D539 Nutritional anemia, unspecified: Secondary | ICD-10-CM

## 2013-07-29 DIAGNOSIS — N186 End stage renal disease: Secondary | ICD-10-CM

## 2013-07-29 DIAGNOSIS — N2581 Secondary hyperparathyroidism of renal origin: Secondary | ICD-10-CM | POA: Diagnosis present

## 2013-07-29 DIAGNOSIS — D691 Qualitative platelet defects: Secondary | ICD-10-CM | POA: Diagnosis not present

## 2013-07-29 DIAGNOSIS — Q613 Polycystic kidney, unspecified: Secondary | ICD-10-CM

## 2013-07-29 DIAGNOSIS — B59 Pneumocystosis: Secondary | ICD-10-CM

## 2013-07-29 DIAGNOSIS — N5089 Other specified disorders of the male genital organs: Secondary | ICD-10-CM

## 2013-07-29 DIAGNOSIS — Z7901 Long term (current) use of anticoagulants: Secondary | ICD-10-CM

## 2013-07-29 DIAGNOSIS — J189 Pneumonia, unspecified organism: Secondary | ICD-10-CM

## 2013-07-29 DIAGNOSIS — Z992 Dependence on renal dialysis: Secondary | ICD-10-CM

## 2013-07-29 DIAGNOSIS — R310 Gross hematuria: Secondary | ICD-10-CM

## 2013-07-29 DIAGNOSIS — B20 Human immunodeficiency virus [HIV] disease: Principal | ICD-10-CM

## 2013-07-29 LAB — CBC WITH DIFFERENTIAL/PLATELET
Basophils Absolute: 0 10*3/uL (ref 0.0–0.1)
HCT: 19.8 % — ABNORMAL LOW (ref 39.0–52.0)
Hemoglobin: 6.8 g/dL — CL (ref 13.0–17.0)
Lymphocytes Relative: 13 % (ref 12–46)
Lymphs Abs: 0.8 10*3/uL (ref 0.7–4.0)
MCV: 108.8 fL — ABNORMAL HIGH (ref 78.0–100.0)
Monocytes Absolute: 0.2 10*3/uL (ref 0.1–1.0)
Neutro Abs: 5.1 10*3/uL (ref 1.7–7.7)
RBC: 1.82 MIL/uL — ABNORMAL LOW (ref 4.22–5.81)
RDW: 13.5 % (ref 11.5–15.5)
WBC: 6.1 10*3/uL (ref 4.0–10.5)

## 2013-07-29 LAB — PREPARE RBC (CROSSMATCH)

## 2013-07-29 LAB — BASIC METABOLIC PANEL WITH GFR
Chloride: 89 mEq/L — ABNORMAL LOW (ref 96–112)
GFR, Est African American: 6 mL/min — ABNORMAL LOW
GFR, Est Non African American: 5 mL/min — ABNORMAL LOW
Glucose, Bld: 116 mg/dL — ABNORMAL HIGH (ref 70–99)
Potassium: 4.9 mEq/L (ref 3.5–5.3)
Sodium: 126 mEq/L — ABNORMAL LOW (ref 135–145)

## 2013-07-29 LAB — OCCULT BLOOD X 1 CARD TO LAB, STOOL: Fecal Occult Bld: POSITIVE — AB

## 2013-07-29 MED ORDER — EFAVIRENZ 600 MG PO TABS
600.0000 mg | ORAL_TABLET | Freq: Every day | ORAL | Status: DC
  Administered 2013-07-29 – 2013-08-06 (×9): 600 mg via ORAL
  Filled 2013-07-29 (×10): qty 1

## 2013-07-29 MED ORDER — LAMIVUDINE 10 MG/ML PO SOLN
100.0000 mg | Freq: Every day | ORAL | Status: DC
  Administered 2013-07-30 – 2013-07-31 (×3): 100 mg via ORAL
  Filled 2013-07-29 (×5): qty 10

## 2013-07-29 MED ORDER — HYDROCODONE-ACETAMINOPHEN 5-325 MG PO TABS: 1.0000 | ORAL_TABLET | ORAL | Status: DC | PRN

## 2013-07-29 MED ORDER — LAMIVUDINE 100 MG PO TABS: 100.0000 mg | ORAL_TABLET | Freq: Every day | ORAL | Status: DC

## 2013-07-29 MED ORDER — ZIDOVUDINE 300 MG PO TABS: 300.0000 mg | ORAL_TABLET | Freq: Every day | ORAL | Status: DC

## 2013-07-29 MED ORDER — SODIUM BICARBONATE 8.4 % IV SOLN
INTRAVENOUS | Status: DC
  Administered 2013-07-30 – 2013-08-02 (×4): via INTRAVENOUS
  Filled 2013-07-29 (×12): qty 1000

## 2013-07-29 MED ORDER — AZITHROMYCIN 500 MG PO TABS
500.0000 mg | ORAL_TABLET | ORAL | Status: DC
  Administered 2013-07-29 – 2013-08-01 (×4): 500 mg via ORAL
  Filled 2013-07-29 (×5): qty 1

## 2013-07-29 MED ORDER — ALBUTEROL SULFATE HFA 108 (90 BASE) MCG/ACT IN AERS
2.0000 | INHALATION_SPRAY | Freq: Four times a day (QID) | RESPIRATORY_TRACT | Status: DC | PRN
  Filled 2013-07-29: qty 6.7

## 2013-07-29 MED ORDER — ONDANSETRON HCL 4 MG PO TABS
4.0000 mg | ORAL_TABLET | Freq: Four times a day (QID) | ORAL | Status: DC | PRN
  Administered 2013-07-29: 4 mg via ORAL
  Filled 2013-07-29: qty 1

## 2013-07-29 MED ORDER — ACETAMINOPHEN 650 MG RE SUPP: 650.0000 mg | Freq: Four times a day (QID) | RECTAL | Status: DC | PRN

## 2013-07-29 MED ORDER — DEXTROSE 5 % IV SOLN
1.0000 g | INTRAVENOUS | Status: DC
  Administered 2013-07-30: 1 g via INTRAVENOUS
  Filled 2013-07-29 (×2): qty 10

## 2013-07-29 MED ORDER — ZIDOVUDINE 100 MG PO CAPS
300.0000 mg | ORAL_CAPSULE | Freq: Every day | ORAL | Status: DC
  Administered 2013-07-30 – 2013-08-03 (×6): 300 mg via ORAL
  Filled 2013-07-29 (×7): qty 3

## 2013-07-29 MED ORDER — ONDANSETRON HCL 4 MG/2ML IJ SOLN: 4.0000 mg | Freq: Four times a day (QID) | INTRAMUSCULAR | Status: DC | PRN

## 2013-07-29 MED ORDER — ACETAMINOPHEN 325 MG PO TABS
650.0000 mg | ORAL_TABLET | Freq: Four times a day (QID) | ORAL | Status: DC | PRN
  Administered 2013-07-29: 650 mg via ORAL
  Filled 2013-07-29: qty 2
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
## 2013-07-29 NOTE — Telephone Encounter (Signed)
Pt calls and states he has for several days had congestion, productive cough of bloody sputum, diarrhea, pt states he feels "so bad". appt given for 1330 dr gill. He is ask to go to ED or urg care if he becomes worse
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
## 2013-07-30 DIAGNOSIS — I120 Hypertensive chronic kidney disease with stage 5 chronic kidney disease or end stage renal disease: Secondary | ICD-10-CM

## 2013-07-30 LAB — EXPECTORATED SPUTUM ASSESSMENT W GRAM STAIN, RFLX TO RESP C

## 2013-07-30 LAB — COMPREHENSIVE METABOLIC PANEL
BUN: 116 mg/dL — ABNORMAL HIGH (ref 6–23)
CO2: 13 mEq/L — ABNORMAL LOW (ref 19–32)
Calcium: 8.2 mg/dL — ABNORMAL LOW (ref 8.4–10.5)
Chloride: 89 mEq/L — ABNORMAL LOW (ref 96–112)
Creatinine, Ser: 11.54 mg/dL — ABNORMAL HIGH (ref 0.50–1.35)
GFR calc Af Amer: 6 mL/min — ABNORMAL LOW (ref >90)
GFR calc non Af Amer: 5 mL/min — ABNORMAL LOW (ref >90)
Glucose, Bld: 132 mg/dL — ABNORMAL HIGH (ref 70–99)
Total Bilirubin: 0.4 mg/dL (ref 0.3–1.2)

## 2013-07-30 LAB — HEPATIC FUNCTION PANEL
ALT: 75 U/L — ABNORMAL HIGH (ref 0–53)
AST: 141 U/L — ABNORMAL HIGH (ref 0–37)
Total Protein: 6.6 g/dL (ref 6.0–8.3)

## 2013-07-30 LAB — HAPTOGLOBIN: Haptoglobin: 103 mg/dL (ref 45–215)

## 2013-07-30 LAB — HEPATITIS PANEL, ACUTE
HCV Ab: NEGATIVE
Hep A IgM: NEGATIVE
Hep B C IgM: NEGATIVE
Hepatitis B Surface Ag: NEGATIVE

## 2013-07-30 LAB — FOLATE: Folate: 16 ng/mL

## 2013-07-30 LAB — BASIC METABOLIC PANEL
BUN: 119 mg/dL — ABNORMAL HIGH (ref 6–23)
CO2: 14 mEq/L — ABNORMAL LOW (ref 19–32)
Calcium: 8.2 mg/dL — ABNORMAL LOW (ref 8.4–10.5)
Chloride: 86 mEq/L — ABNORMAL LOW (ref 96–112)
Creatinine, Ser: 11.34 mg/dL — ABNORMAL HIGH (ref 0.50–1.35)

## 2013-07-30 LAB — CBC
HCT: 21.7 % — ABNORMAL LOW (ref 39.0–52.0)
Hemoglobin: 7.4 g/dL — ABNORMAL LOW (ref 13.0–17.0)
MCH: 35.9 pg — ABNORMAL HIGH (ref 26.0–34.0)
MCH: 36.4 pg — ABNORMAL HIGH (ref 26.0–34.0)
MCV: 103.8 fL — ABNORMAL HIGH (ref 78.0–100.0)
Platelets: 98 10*3/uL — ABNORMAL LOW (ref 150–400)
Platelets: 96 10*3/uL — ABNORMAL LOW (ref 150–400)
RBC: 2.06 MIL/uL — ABNORMAL LOW (ref 4.22–5.81)
RDW: 16.3 % — ABNORMAL HIGH (ref 11.5–15.5)
WBC: 5.6 10*3/uL (ref 4.0–10.5)
WBC: 5.9 10*3/uL (ref 4.0–10.5)

## 2013-07-30 LAB — IRON AND TIBC
Saturation Ratios: 7 % — ABNORMAL LOW (ref 20–55)
TIBC: 212 ug/dL — ABNORMAL LOW (ref 215–435)

## 2013-07-30 LAB — VITAMIN B12: Vitamin B-12: 482 pg/mL (ref 211–911)

## 2013-07-30 MED ORDER — ONDANSETRON HCL 4 MG/2ML IJ SOLN: 4.0000 mg | Freq: Four times a day (QID) | INTRAMUSCULAR | Status: DC | PRN

## 2013-07-30 MED ORDER — OXYCODONE HCL 5 MG PO TABS
5.0000 mg | ORAL_TABLET | Freq: Four times a day (QID) | ORAL | Status: DC | PRN
  Administered 2013-07-30: 10 mg via ORAL

## 2013-07-30 MED ORDER — OXYCODONE HCL 5 MG PO TABS
5.0000 mg | ORAL_TABLET | Freq: Four times a day (QID) | ORAL | Status: DC | PRN
  Administered 2013-07-30 – 2013-08-06 (×18): 10 mg via ORAL
  Filled 2013-07-30 (×17): qty 2

## 2013-07-30 MED ORDER — ONDANSETRON HCL 4 MG PO TABS: 4.0000 mg | ORAL_TABLET | Freq: Four times a day (QID) | ORAL | Status: DC | PRN

## 2013-07-30 MED ORDER — NICOTINE 14 MG/24HR TD PT24
14.0000 mg | MEDICATED_PATCH | Freq: Every day | TRANSDERMAL | Status: DC
  Administered 2013-07-30 – 2013-08-06 (×8): 14 mg via TRANSDERMAL
  Filled 2013-07-30 (×9): qty 1

## 2013-07-30 MED ORDER — OXYCODONE HCL 5 MG PO TABS
ORAL_TABLET | ORAL | Status: AC
  Filled 2013-07-30: qty 2

## 2013-07-30 MED ORDER — IBUPROFEN 600 MG PO TABS
600.0000 mg | ORAL_TABLET | Freq: Four times a day (QID) | ORAL | Status: DC | PRN
  Administered 2013-07-30: 600 mg via ORAL
  Filled 2013-07-30: qty 1

## 2013-07-30 MED ORDER — VANCOMYCIN HCL 10 G IV SOLR
1750.0000 mg | Freq: Once | INTRAVENOUS | Status: AC
  Administered 2013-07-30: 1750 mg via INTRAVENOUS
  Filled 2013-07-30: qty 1750

## 2013-07-30 MED ORDER — OXYCODONE-ACETAMINOPHEN 5-325 MG PO TABS
2.0000 | ORAL_TABLET | Freq: Three times a day (TID) | ORAL | Status: DC | PRN
  Administered 2013-07-30: 2 via ORAL
  Filled 2013-07-30: qty 2

## 2013-07-30 MED ORDER — ALBUTEROL SULFATE HFA 108 (90 BASE) MCG/ACT IN AERS
2.0000 | INHALATION_SPRAY | Freq: Four times a day (QID) | RESPIRATORY_TRACT | Status: DC | PRN
  Administered 2013-08-04: 2 via RESPIRATORY_TRACT
  Filled 2013-07-30 (×2): qty 6.7

## 2013-07-30 MED ORDER — IBUPROFEN 600 MG PO TABS
600.0000 mg | ORAL_TABLET | Freq: Four times a day (QID) | ORAL | Status: DC | PRN
  Administered 2013-07-31 – 2013-08-02 (×4): 600 mg via ORAL
  Filled 2013-07-30 (×6): qty 1

## 2013-07-30 MED ORDER — DEXTROSE 5 % IV SOLN
500.0000 mg | INTRAVENOUS | Status: DC
  Administered 2013-07-30 – 2013-08-04 (×6): 500 mg via INTRAVENOUS
  Filled 2013-07-30 (×6): qty 0.5

## 2013-07-30 NOTE — Progress Notes (Signed)
Utilization review completed. Genella Bas, RN, BSN. 

## 2013-07-30 NOTE — Consult Note (Signed)
I have seen and examined this patient and agree with the plan of care. Patient refuses to start dialysis. Recommend palliative care consult and medical management Sherril Croon 07/30/2013, 2:38 PM

## 2013-07-30 NOTE — H&P (Signed)
Date: 07/30/2013  Brian [name] name: Brian [name]  Medical record number: [id number]  Date of birth: 01-Jun-1976   I have seen and evaluated Brian [name] and discussed their care with the Residency Team.  Brian [name] is a 37yo M with HIV, CD 4 count 580/VL<20 also has ESRD 2/2 PCKD not currently on HD, he presents with fevers, hemoptysis and GI symptoms of N/V which preceded his pulmonary symptoms. cxr has new infiltreates concerning for bacterial process but also mTB. He was found to be anemic as well due to concern for UGIB. He received 1 units of blood and is now normalized. HE was admitted for worsening anemia 2/2 GIB, AKI and pneumonia. He was started on CaP tx with CTX and Azithro. He was seen by renal team for evaluation of HD, but Brian [name] is adamant in not pursuing dialysis. Thus palliatvie care consultation was recommended.  Physical Exam: Blood pressure 111/80, pulse 116, temperature 100 F (37.8 C), temperature source Oral, resp. rate 20, height 6\' 2"  (1.88 m), weight 195 lb 3.2 oz (88.542 kg), SpO2 93.00%. Physical Exam  Constitutional: He is oriented to person, place, and time. He appears well-developed and well-nourished. No distress.  HENT:  Mouth/Throat: Oropharynx is clear and moist. No oropharyngeal exudate.  Cardiovascular: Normal rate, regular rhythm and normal heart sounds. Exam reveals no gallop and no friction rub.  No murmur heard.  Pulmonary/Chest: Effort normal and breath sounds normal. No respiratory distress. He has no wheezes. Decrease BS at bases Abdominal: firm. Bowel sounds are normal. He exhibits no distension. There is no tenderness.  Lymphadenopathy:  He has no cervical adenopathy.  Neurological: He is alert and oriented to person, place, and time.  Skin: Skin is warm and dry. No rash noted. No erythema.  Psychiatric: He has a normal mood and affect. His behavior is normal.    Lab results: Results for orders placed during the hospital encounter of 07/29/13  (from the past 24 hour(s))  OCCULT BLOOD X 1 CARD TO LAB, STOOL     Status: Abnormal   Collection Time    07/29/13 10:34 PM      Result Value Range   Fecal Occult Bld POSITIVE (*) NEGATIVE  PREPARE RBC (CROSSMATCH)     Status: None   Collection Time    07/29/13 10:50 PM      Result Value Range   Order Confirmation ORDER PROCESSED BY BLOOD BANK    TYPE AND SCREEN     Status: None   Collection Time    07/29/13 10:50 PM      Result Value Range   ABO/RH(D) A POS     Antibody Screen NEG     Sample Expiration 08/01/2013     Unit Number [id number]     Blood Component Type RED CELLS,LR     Unit division 00     Status of Unit ISSUED     Transfusion Status OK TO TRANSFUSE     Crossmatch Result Compatible    VITAMIN B12     Status: None   Collection Time    07/29/13 11:10 PM      Result Value Range   Vitamin B-12 482  211 - 911 pg/mL  FOLATE     Status: None   Collection Time    07/29/13 11:10 PM      Result Value Range   Folate 16.0    IRON AND TIBC     Status: Abnormal   Collection Time    07/29/13  11:10 PM      Result Value Range   Iron 14 (*) 42 - 135 ug/dL   TIBC 212 (*) 215 - 435 ug/dL   Saturation Ratios 7 (*) 20 - 55 %   UIBC 198  125 - 400 ug/dL  FERRITIN     Status: Abnormal   Collection Time    07/29/13 11:10 PM      Result Value Range   Ferritin 784 (*) 22 - 322 ng/mL  RETICULOCYTES     Status: Abnormal   Collection Time    07/29/13 11:10 PM      Result Value Range   Retic Ct Pct 0.8  0.4 - 3.1 %   RBC. 1.79 (*) 4.22 - 5.81 MIL/uL   Retic Count, Manual 14.3 (*) 19.0 - 186.0 K/uL  LACTATE DEHYDROGENASE     Status: Abnormal   Collection Time    07/29/13 11:10 PM      Result Value Range   LDH 538 (*) 94 - 250 U/L  HAPTOGLOBIN     Status: None   Collection Time    07/29/13 11:10 PM      Result Value Range   Haptoglobin 103  45 - 215 mg/dL  HEPATIC FUNCTION PANEL     Status: Abnormal   Collection Time    07/29/13 11:10 PM      Result Value Range    Total Protein 6.6  6.0 - 8.3 g/dL   Albumin 2.9 (*) 3.5 - 5.2 g/dL   AST 141 (*) 0 - 37 U/L   ALT 75 (*) 0 - 53 U/L   Alkaline Phosphatase 42  39 - 117 U/L   Total Bilirubin 0.3  0.3 - 1.2 mg/dL   Bilirubin, Direct 0.2  0.0 - 0.3 mg/dL   Indirect Bilirubin 0.1 (*) 0.3 - 0.9 mg/dL  HEPATITIS PANEL, ACUTE     Status: None   Collection Time    07/29/13 11:10 PM      Result Value Range   Hepatitis B Surface Ag NEGATIVE  NEGATIVE   HCV Ab NEGATIVE  NEGATIVE   Hep A IgM NEGATIVE  NEGATIVE   Hep B C IgM NEGATIVE  NEGATIVE  CULTURE, EXPECTORATED SPUTUM-ASSESSMENT     Status: None   Collection Time    07/30/13 12:45 AM      Result Value Range   Specimen Description SPUTUM     Special Requests NONE     Sputum evaluation       Value: MICROSCOPIC FINDINGS SUGGEST THAT THIS SPECIMEN IS NOT REPRESENTATIVE OF LOWER RESPIRATORY SECRETIONS. PLEASE RECOLLECT.     CALLED TO MOORE,K RN 07/30/2013 0347 JORDANS   Report Status 07/30/2013 FINAL    COMPREHENSIVE METABOLIC PANEL     Status: Abnormal   Collection Time    07/30/13  5:00 AM      Result Value Range   Sodium 126 (*) 135 - 145 mEq/L   Potassium 5.3 (*) 3.5 - 5.1 mEq/L   Chloride 89 (*) 96 - 112 mEq/L   CO2 13 (*) 19 - 32 mEq/L   Glucose, Bld 132 (*) 70 - 99 mg/dL   BUN 116 (*) 6 - 23 mg/dL   Creatinine, Ser 11.54 (*) 0.50 - 1.35 mg/dL   Calcium 8.2 (*) 8.4 - 10.5 mg/dL   Total Protein 6.1  6.0 - 8.3 g/dL   Albumin 2.7 (*) 3.5 - 5.2 g/dL   AST 225 (*) 0 - 37 U/L   ALT 115 (*)  0 - 53 U/L   Alkaline Phosphatase 42  39 - 117 U/L   Total Bilirubin 0.4  0.3 - 1.2 mg/dL   GFR calc non Af Amer 5 (*) >90 mL/min   GFR calc Af Amer 6 (*) >90 mL/min  CBC     Status: Abnormal   Collection Time    07/30/13  6:30 AM      Result Value Range   WBC 5.6  4.0 - 10.5 K/uL   RBC 2.06 (*) 4.22 - 5.81 MIL/uL   Hemoglobin 7.4 (*) 13.0 - 17.0 g/dL   HCT 21.9 (*) 39.0 - 52.0 %   MCV 106.3 (*) 78.0 - 100.0 fL   MCH 35.9 (*) 26.0 - 34.0 pg   MCHC 33.8   30.0 - 36.0 g/dL   RDW 16.2 (*) 11.5 - 15.5 %   Platelets 98 (*) 150 - 400 K/uL  STREP PNEUMONIAE URINARY ANTIGEN     Status: None   Collection Time    07/30/13 12:14 PM      Result Value Range   Strep Pneumo Urinary Antigen NEGATIVE  NEGATIVE  CULTURE, EXPECTORATED SPUTUM-ASSESSMENT     Status: None   Collection Time    07/30/13  3:08 PM      Result Value Range   Specimen Description SPUTUM     Special Requests NONE     Sputum evaluation       Value: MICROSCOPIC FINDINGS SUGGEST THAT THIS SPECIMEN IS NOT REPRESENTATIVE OF LOWER RESPIRATORY SECRETIONS. PLEASE RECOLLECT.     NOTIFIED A TONEY 1605 07/30/13 A BROWNING   Report Status 07/30/2013 FINAL    CBC     Status: Abnormal   Collection Time    07/30/13  4:57 PM      Result Value Range   WBC 5.9  4.0 - 10.5 K/uL   RBC 2.09 (*) 4.22 - 5.81 MIL/uL   Hemoglobin 7.6 (*) 13.0 - 17.0 g/dL   HCT 21.7 (*) 39.0 - 52.0 %   MCV 103.8 (*) 78.0 - 100.0 fL   MCH 36.4 (*) 26.0 - 34.0 pg   MCHC 35.0  30.0 - 36.0 g/dL   RDW 16.3 (*) 11.5 - 15.5 %   Platelets 96 (*) 150 - 400 K/uL  BASIC METABOLIC PANEL     Status: Abnormal   Collection Time    07/30/13  4:57 PM      Result Value Range   Sodium 125 (*) 135 - 145 mEq/L   Potassium 5.0  3.5 - 5.1 mEq/L   Chloride 86 (*) 96 - 112 mEq/L   CO2 14 (*) 19 - 32 mEq/L   Glucose, Bld 139 (*) 70 - 99 mg/dL   BUN 119 (*) 6 - 23 mg/dL   Creatinine, Ser 11.34 (*) 0.50 - 1.35 mg/dL   Calcium 8.2 (*) 8.4 - 10.5 mg/dL   GFR calc non Af Amer 5 (*) >90 mL/min   GFR calc Af Amer 6 (*) >90 mL/min    Imaging results:  Dg Chest 2 View  07/29/2013   *RADIOLOGY REPORT*  Clinical Data: Fever, night sweats, cough, congestion, hemoptysis. HIV.  Smoker.  CHEST - 2 VIEW  Comparison: Chest CT 01/22/2011.  Findings: There is air space consolidation in the superior segment right lower lobe.  There may be involvement of the posterior segment right upper lobe as well.  Lungs are otherwise clear.  No pleural fluid.   Heart size normal.  Trachea is midline.  IMPRESSION: Airspace consolidation  in the superior segment right lower lobe and possibly posterior segment right upper lobe.  The findings are most consistent with pneumonia, possibly even active tuberculosis. These results will be called to the ordering clinician or representative by the Radiologist Assistant, and communication documented in the PACS Dashboard.   Original Report Authenticated By: [name], M.D.    Assessment and Plan: I have seen and evaluated the Brian [name] as outlined above. I agree with the formulated Assessment and Plan as detailed in the residents' admission note, with the following changes:   Agree with plan as oultined by Dr. Lucila Maine and Ronnald Ramp. We will rule out for mTB with 3 AFB sputusms. WE will aslo have Brian [name] be assessed by palliative care team to ensure he understands whant no further dialysis means.  Carlyle Basques, MD 8/15/201410:26 PM

## 2013-07-30 NOTE — Progress Notes (Signed)
Thank you for consulting the Palliative Medicine Team at [facility] to meet your patient's and family's needs.  The reason that you asked [place] to see your patient is for a GOC - Goals of Care. Pt declining HD We have scheduled your patient for a meeting Sat 8/16 @  12 noon  BG No other family members per pt - grandmother doesn't drive.  Your patient is able to participate.  Unk Pinto RN,  PMT RN Liaison Call PMT phone [phone]

## 2013-07-30 NOTE — Progress Notes (Signed)
Subjective: [name] is a 37 y.o. man with history of HIV (followed by Dr. 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  Patient seen at bedside this AM. He is still not feeling great, but says he is improved. Afebrile on exam. Still with cough. Denies any recent nausea, vomiting, chills, SOB, or chest pain. No recent production of phlegm or presence of hemoptysis.   Patient presented on admission w/ Cr of 11.54. Renal was consulted and suggests starting HD, but the patient declines. The team had a long discussion w/ him about his need to start HD, but he has a fear of needles and does not want to go to HD every other day. Patient said he still needs some time to think about starting HD.  On admission, the patient presented w/ Hb of 6.8. He was given 1 unit of PRBCs and his Hb went up to 7.4. Patient is stable at this time.  Objective: Vital signs in last 24 hours: Filed Vitals:   07/30/13 0339 07/30/13 0418 07/30/13 1400 07/30/13 1647  BP: 113/78 110/78 111/80   Pulse: 105 97 116   Temp: 98.8 F (37.1 C) 99.4 F (37.4 C) 98.3 F (36.8 C) 100 F (37.8 C)  TempSrc: Oral Oral Oral Oral  Resp: 18 18 20    Height:      Weight:      SpO2: 96%  93%    Weight change:   Intake/Output Summary (Last 24 hours) at 07/30/13 1721 Last data filed at 07/30/13 0418  Gross per 24 hour  Intake    545 ml  Output    300 ml  Net    245 ml   Physical Exam: General: Alert, cooperative, and in no apparent distress HEENT: Vision grossly intact, oropharynx clear and non-erythematous  Neck: Full range of motion without pain, supple, no lymphadenopathy or carotid bruits Lungs: Clear to ascultation bilaterally. Some mild wheezes and crackles in both lungs on auscultation.  Heart: Regular rate and rhythm, no murmurs, gallops, or  rubs Abdomen: Distended, with tenderness to palpation. Mass palpated in R LQ, most likely part of his PCK. Liver palpated on exam. Bowel sounds present. Extremities: No cyanosis, clubbing, or edema Neurologic: Alert & oriented X3, cranial nerves II-XII intact, strength grossly intact, sensation intact to light touch  Lab Results: Basic Metabolic Panel:  Recent Labs Lab 07/29/13 1418 07/30/13 0500  NA 126* 126*  K 4.9 5.3*  CL 89* 89*  CO2 14* 13*  GLUCOSE 116* 132*  BUN 107* 116*  CREATININE 11.01* 11.54*  CALCIUM 8.3* 8.2*   Liver Function Tests:  Recent Labs Lab 07/29/13 2310 07/30/13 0500  AST 141* 225*  ALT 75* 115*  ALKPHOS 42 42  BILITOT 0.3 0.4  PROT 6.6 6.1  ALBUMIN 2.9* 2.7*   No results found for this basename: LIPASE, AMYLASE,  in the last 168 hours No results found for this basename: AMMONIA,  in the last 168 hours CBC:  Recent Labs Lab 07/29/13 1418 07/30/13 0630  WBC 6.1 5.6  NEUTROABS 5.1  --   HGB 6.8* 7.4*  HCT 19.8* 21.9*  MCV 108.8* 106.3*  PLT 104* 98*   Coagulation:  Recent Labs Lab 07/29/13 1429  INR 3.8   Anemia Panel:  Recent Labs Lab 07/29/13 2310  VITAMINB12 482  FOLATE 16.0  FERRITIN 784*  TIBC 212*  IRON 14*  RETICCTPCT 0.8   Urine Drug Screen: Drugs of Abuse     Component Value Date/Time   LABOPIA NEG 03/26/2013 1519   LABOPIA POSITIVE* 09/22/2010 1140   COCAINSCRNUR NEG 03/26/2013 1519   COCAINSCRNUR NONE DETECTED 09/22/2010 1140   LABBENZ NEG 03/26/2013 1519   LABBENZ NEG 07/30/2011 1656   LABBENZ POSITIVE* 09/22/2010 1140   AMPHETMU NEG 07/30/2011 1656   AMPHETMU NONE DETECTED 09/22/2010 1140   THCU NEG 03/26/2013 1519   THCU NONE DETECTED 09/22/2010 1140   LABBARB NEG 03/26/2013 1519   LABBARB  Value: NONE DETECTED        DRUG SCREEN FOR MEDICAL PURPOSES ONLY.  IF CONFIRMATION IS NEEDED FOR ANY PURPOSE, NOTIFY LAB WITHIN 5 DAYS.        LOWEST DETECTABLE LIMITS FOR URINE DRUG SCREEN Drug Class       Cutoff (ng/mL)  Amphetamine      1000 Barbiturate      200 Benzodiazepine   [id number] Tricyclics       [id number] Opiates          300 Cocaine          300 THC              50 09/22/2010 1140    Micro Results: Recent Results (from the past 240 hour(s))  CULTURE, BLOOD (SINGLE)     Status: None   Collection Time    07/29/13  2:18 PM      Result Value Range Status   Preliminary Report Blood Culture received; No Growth to date;   Preliminary   Preliminary Report Culture will be held for 5 days before issuing   Preliminary   Preliminary Report a Final Negative report.   Preliminary  MRSA PCR SCREENING     Status: None   Collection Time    07/29/13  6:15 PM      Result Value Range Status   MRSA by PCR NEGATIVE  NEGATIVE Final   Comment:            The GeneXpert MRSA Assay (FDA     approved for NASAL specimens     only), is one component of a     comprehensive MRSA colonization     surveillance program. It is not     intended to diagnose MRSA     infection nor to guide or     monitor treatment for     MRSA infections.  CULTURE, EXPECTORATED SPUTUM-ASSESSMENT     Status: None   Collection Time    07/30/13 12:45 AM      Result Value Range Status   Specimen Description SPUTUM   Final   Special Requests NONE   Final   Sputum evaluation     Final   Value: MICROSCOPIC FINDINGS SUGGEST THAT THIS SPECIMEN IS NOT REPRESENTATIVE OF LOWER RESPIRATORY SECRETIONS. PLEASE RECOLLECT.     CALLED TO MOORE,K RN 07/30/2013 0347 JORDANS   Report Status 07/30/2013 FINAL   Final  CULTURE, EXPECTORATED SPUTUM-ASSESSMENT     Status: None   Collection Time    07/30/13  3:08 PM      Result Value Range Status   Specimen Description SPUTUM   Final   Special Requests NONE   Final   Sputum evaluation     Final   Value: MICROSCOPIC FINDINGS SUGGEST THAT THIS SPECIMEN IS NOT REPRESENTATIVE OF LOWER RESPIRATORY SECRETIONS. PLEASE RECOLLECT.     NOTIFIED A  TONEY 1605 07/30/13 A BROWNING   Report Status 07/30/2013 FINAL   Final    Studies/Results: Dg Chest 2 View  07/29/2013   *RADIOLOGY REPORT*  Clinical Data: Fever, night sweats, cough, congestion, hemoptysis. HIV.  Smoker.  CHEST - 2 VIEW  Comparison: Chest CT 01/22/2011.  Findings: There is air space consolidation in the superior segment right lower lobe.  There may be involvement of the posterior segment right upper lobe as well.  Lungs are otherwise clear.  No pleural fluid.  Heart size normal.  Trachea is midline.  IMPRESSION: Airspace consolidation in the superior segment right lower lobe and possibly posterior segment right upper lobe.  The findings are most consistent with pneumonia, possibly even active tuberculosis. These results will be called to the ordering clinician or representative by the Radiologist Assistant, and communication documented in the PACS Dashboard.   Original Report Authenticated By: [name], M.D.   Medications: I have reviewed the patient's current medications. Scheduled Meds: . azithromycin  500 mg Oral Q24H  . ceFEPime (MAXIPIME) IV  500 mg Intravenous Q24H  . efavirenz  600 mg Oral QHS  . lamiVUDine  100 mg Oral Daily  . nicotine  14 mg Transdermal QPC supper  . oxyCODONE      . vancomycin  1,750 mg Intravenous Once  . zidovudine  300 mg Oral Daily   Continuous Infusions: . dextrose 5 % 1,000 mL with sodium bicarbonate 150 mEq infusion 75 mL/hr at 07/30/13 0000   PRN Meds:.albuterol, ibuprofen, ondansetron (ZOFRAN) IV, ondansetron, oxyCODONE Assessment/Plan:  #Community acquired pneumonia - History of acute development of productive cough with CXR showing lobar consolidation are most suggestive of CAP, however, the patient was recently hospitalized in 05/2013, therefre this must be treated as HCAP. No WBC, but a left shift is present (85%). Borderline fevers noted (100.3-101.3 F). He endorses hemoptysis, however he describes the "chunks of blood" in his sputum as more brown than bright red. We have instructed him to save any  sputum he produces to tease out whether this represents true hemoptysis vs. purulent sputum production. He is supra therapeutic on his coumadin (INR 3.8), so hemoptysis simply 2/2 his CAP/HCAP is also possible. Tuberculosis is also on the differential. The location of his consolidation on CXR is appropriate for active TB. However, the acuity of his symptoms is less consistent with this diagnosis; he appears to have been well prior to Sunday, with no history of insidious low-grade fever, night sweats, weight loss, cough. His risk factors for TB include HIV infection and recent semi-homelessness 2/2 his building being condemned (though he has not been sleeping on the street or in shelters). He has never been incarcerated, denies travel, denies sick contacts.  For now, the patient is in a negative pressure room.  - AFB sputum culture, q8h x3 - Antibiotic coverage changed; d/c'ed Rocephin, and started Vancomycin 1750 mg IVPB once, plus Cefipime 500 mg IVPB qd for coverage of HCAP as patient was hospitalized recently in 05/2011. - Follow up blood and sputum cultures  - Legionella antigen still pending; strep pneumo antigen -ve - Albuterol inhaler prn wheezing  - Oxycodone 5-10mg  q6h prn pain  - Zofran prn nausea   #Macrocytic anemia - Hgb 6.8, MCV 108.8 on admission. Original differential diagnoses included acute blood loss (possible sources include sputum, stool, w/ +ve FOBT, urine, or internal hemorrhage as patient with multiple cysts in kidneys and liver, with some noted to have internal hyperdensities on CT 05/19/13 suggesting hemorrhage). Other causes  of macrocytic anemia on the differential include drug induced anemia (on zidovidine), liver disease, alcohol abuse, folate or B12 deficiency, hemolytic anemia. Hb has stabilized at this time, currently 7.6, increased from 7.4. Received 1 unit of PBC's earlier this AM b/c his Hb was 6.8. Folate and B12 were both wnl. Anemia panel points more towards a mixed  pictures at this time including liver disease, history of alcohol abuse, ACD, and iron deficiency. Iron 14, TIBC 212, and Ferritin 784.  -Continue monitoring CBC, next in AM.  -Transfuse another unit of PRBC's if significant decline in Hb.  #Transaminitis - AST/ALT 141/75, from 13/7 on 05/19/13. Patient does have a history of alcohol abuse, and his AST/ALT ratio are consistent with this, however he denies any recent alcohol abuse secondary to his recent illness for the past week. 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 He has physical exam findings suggestive of liver disease including asterixis and hepatomegaly. His bilirubin is not elevated. However, he may have reduced synthetic function given thrombocytopenia (104), elevated INR (3.8), low albumin (2.9).  - Hepatitis serologies still pending; acute viral hepatitis could also explain his GI symptoms  - Avoiding medications with tylenol  #Electrolyte abnormalities/ESRD- BMP shows hyponatremia and metabolic acidosis (AG 23) in this patient with vomiting and ESRD. We are limited in terms of IVF treatment options given ESRD.  - Sodium bicarb 179meq infusion @ 75cc/hr was given this AM, per renal recs   #Supratherapeutic INR - 3.8 on admission. Likely 2/2 decreased po intake combined with ?decreased hepatic synthetic function. May have contributed to his possible bleeding issue at this time.  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 Still making urine. Dialysis was planned for next month, but we may want to accelerate this. He has a mature AV fistula.  - Renal on board. Patient is refusing to start HD at this time. He says he would rather slowly decline in health rather than start HD d/t his fear of needles and refusal to comply with an outpatient regimen. Palliative care was consulted to discuss end of life care with him if he is refusing to start HD, given his poor state currently and  poor outcome.  #HTN - BP stable. - Holding home norvasc   #HIV - Stable. Followed by Dr. Linus Salmons, last CD4 580, viral load <20 in 6/14.  - Continue lamividine, zidovidine, efavirenz  Dispo: Disposition is deferred at this time, awaiting improvement of current medical problems.  Anticipated discharge in approximately 3-4 day(s).   The patient does have a current PCP [name], MD) and does need an [facility] hospital follow-up appointment after discharge.  The patient does not have transportation limitations that hinder transportation to clinic appointments.  .Services Needed at time of discharge: Y = Yes, Blank = No PT:   OT:   RN:   Equipment:   Other:     LOS: 1 day   Luanne Bras, MD 07/30/2013, 5:21 PM Pager: [phone]
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
## 2013-07-31 ENCOUNTER — Encounter (HOSPITAL_COMMUNITY): Payer: Self-pay

## 2013-07-31 ENCOUNTER — Encounter: Payer: Self-pay | Admitting: Internal Medicine

## 2013-07-31 DIAGNOSIS — B20 Human immunodeficiency virus [HIV] disease: Principal | ICD-10-CM

## 2013-07-31 DIAGNOSIS — N186 End stage renal disease: Secondary | ICD-10-CM

## 2013-07-31 DIAGNOSIS — E871 Hypo-osmolality and hyponatremia: Secondary | ICD-10-CM

## 2013-07-31 LAB — COMPREHENSIVE METABOLIC PANEL
AST: 301 U/L — ABNORMAL HIGH (ref 0–37)
Albumin: 2.4 g/dL — ABNORMAL LOW (ref 3.5–5.2)
Calcium: 7.9 mg/dL — ABNORMAL LOW (ref 8.4–10.5)
Chloride: 86 mEq/L — ABNORMAL LOW (ref 96–112)
Creatinine, Ser: 11.71 mg/dL — ABNORMAL HIGH (ref 0.50–1.35)
Total Bilirubin: 0.4 mg/dL (ref 0.3–1.2)

## 2013-07-31 LAB — TYPE AND SCREEN: ABO/RH(D): A POS

## 2013-07-31 LAB — CBC
Hemoglobin: 7.3 g/dL — ABNORMAL LOW (ref 13.0–17.0)
MCH: 36.9 pg — ABNORMAL HIGH (ref 26.0–34.0)
MCV: 103 fL — ABNORMAL HIGH (ref 78.0–100.0)
MCV: 101 fL — ABNORMAL HIGH (ref 78.0–100.0)
Platelets: 93 10*3/uL — ABNORMAL LOW (ref 150–400)
RBC: 1.98 MIL/uL — ABNORMAL LOW (ref 4.22–5.81)
RDW: 15.9 % — ABNORMAL HIGH (ref 11.5–15.5)
WBC: 4.6 10*3/uL (ref 4.0–10.5)

## 2013-07-31 LAB — LEGIONELLA ANTIGEN, URINE: Legionella Antigen, Urine: NEGATIVE

## 2013-07-31 MED ORDER — DARBEPOETIN ALFA-POLYSORBATE 100 MCG/0.5ML IJ SOLN
100.0000 ug | Freq: Once | INTRAMUSCULAR | Status: AC
  Administered 2013-07-31: 100 ug via SUBCUTANEOUS
  Filled 2013-07-31: qty 0.5

## 2013-07-31 MED ORDER — POLYETHYLENE GLYCOL 3350 17 G PO PACK
17.0000 g | PACK | Freq: Every day | ORAL | Status: DC
  Administered 2013-07-31 – 2013-08-06 (×7): 17 g via ORAL
  Filled 2013-07-31 (×8): qty 1

## 2013-07-31 MED ORDER — SODIUM BICARBONATE 650 MG PO TABS
650.0000 mg | ORAL_TABLET | Freq: Three times a day (TID) | ORAL | Status: DC
  Administered 2013-07-31 – 2013-08-02 (×5): 650 mg via ORAL
  Filled 2013-07-31 (×8): qty 1

## 2013-07-31 MED ORDER — SODIUM BICARBONATE 650 MG PO TABS: 650.0000 mg | ORAL_TABLET | Freq: Three times a day (TID) | ORAL | Status: DC

## 2013-07-31 NOTE — Progress Notes (Signed)
Patient stated he wanted a laxative d/t his abdomen distention. Patient stated a laxative usually helps. MD notified. Will give Miralax daily per MD order.

## 2013-07-31 NOTE — Consult Note (Signed)
Progress Note from the Palliative Medicine Team at [facility]   37 year old male with a history of HIV, ESRD, hypertension, DVTs in the past who was admitted from the clinic due to complaints of fever hemoptysis nausea vomiting. Patient on admission was found to have creatinine of 11.54 with BUN of 123. Nephrology was consulted and they recommended hemodialysis. Patient already has AV fistula in right arm. But patient refused hemodialysis due to his fear of needles. Goals of care meeting was done with the patient for discussion on hemodialysis, CODE STATUS.    Objective: No Known Allergies Scheduled Meds: . azithromycin  500 mg Oral Q24H  . ceFEPime (MAXIPIME) IV  500 mg Intravenous Q24H  . darbepoetin (ARANESP) injection - DIALYSIS  100 mcg Subcutaneous Once  . efavirenz  600 mg Oral QHS  . lamiVUDine  100 mg Oral Daily  . nicotine  14 mg Transdermal QPC supper  . sodium bicarbonate  650 mg Oral TID  . zidovudine  300 mg Oral Daily   Continuous Infusions: . dextrose 5 % 1,000 mL with sodium bicarbonate 150 mEq infusion 75 mL/hr at 07/30/13 2237   PRN Meds:.albuterol, ibuprofen, ondansetron (ZOFRAN) IV, ondansetron, oxyCODONE  BP 116/82  Pulse 111  Temp(Src) 99.4 F (37.4 C) (Oral)  Resp 20  Ht 6\' 2"  (1.88 m)  Wt 88.678 kg (195 lb 8 oz)  BMI 25.09 kg/m2  SpO2 95%      Intake/Output Summary (Last 24 hours) at 07/31/13 1435 Last data filed at 07/31/13 1000  Gross per 24 hour  Intake 1162.5 ml  Output    250 ml  Net  912.5 ml        Physical Exam:  General: Appearing no acute distress HEENT:  NCAT Chest:   Clear bilaterally CVS: S1-S2 regular Abdomen: Soft nontender Ext: No edema Neuro: Alert oriented x3, has decision-making capacity  Labs: CBC    Component Value Date/Time   WBC 4.6 07/31/2013 0545   RBC 1.97* 07/31/2013 0545   RBC 1.79* 07/29/2013 2310   HGB 7.2* 07/31/2013 0545   HCT 20.3* 07/31/2013 0545   PLT 93* 07/31/2013 0545   MCV 103.0* 07/31/2013  0545   MCH 36.5* 07/31/2013 0545   MCHC 35.5 07/31/2013 0545   RDW 15.9* 07/31/2013 0545   LYMPHSABS 0.8 07/29/2013 1418   MONOABS 0.2 07/29/2013 1418   EOSABS 0.0 07/29/2013 1418   BASOSABS 0.0 07/29/2013 1418    BMET    Component Value Date/Time   NA 123* 07/31/2013 0545   K 4.9 07/31/2013 0545   CL 86* 07/31/2013 0545   CO2 14* 07/31/2013 0545   GLUCOSE 132* 07/31/2013 0545   BUN 123* 07/31/2013 0545   CREATININE 11.71* 07/31/2013 0545   CREATININE 11.01* 07/29/2013 1418   CALCIUM 7.9* 07/31/2013 0545   CALCIUM 8.4 10/14/2011 1432   GFRNONAA 5* 07/31/2013 0545   GFRAA 6* 07/31/2013 0545    CMP     Component Value Date/Time   NA 123* 07/31/2013 0545   K 4.9 07/31/2013 0545   CL 86* 07/31/2013 0545   CO2 14* 07/31/2013 0545   GLUCOSE 132* 07/31/2013 0545   BUN 123* 07/31/2013 0545   CREATININE 11.71* 07/31/2013 0545   CREATININE 11.01* 07/29/2013 1418   CALCIUM 7.9* 07/31/2013 0545   CALCIUM 8.4 10/14/2011 1432   PROT 5.5* 07/31/2013 0545   ALBUMIN 2.4* 07/31/2013 0545   AST 301* 07/31/2013 0545   ALT 170* 07/31/2013 0545   ALKPHOS 38* 07/31/2013 0545   BILITOT  0.4 07/31/2013 0545   GFRNONAA 5* 07/31/2013 0545   GFRAA 6* 07/31/2013 0545         1     Assessment and Plan:  End-stage renal disease HIV History of DVT Community acquired pneumonia Hypertension  Discussion- Had a long discussion with the patient regarding end-stage renal disease, hemodialysis. Patient tells me that he has been following nephrology as outpatient, and wanted to consider peritoneal dialysis as he was all-time scared of needles. Discussed with him in detail the need for hemodialysis at this time, and also discussed the complications of not undergoing hemodialysis including uremia, electrolyte abnormalities, mental status changes including death. Patient does not want to opt for comfort measures at this time, and wants to get hemodialysis in a.m. He would like to get regular diet. Discussed with Dr. Jonnie Finner  in detail, patient will get hemodialysis in a.m.  Recommendations #1 change the diet to regular #2 hemodialysis in a.m.     Patient Documents Completed or Given: Document Given Completed  Advanced Directives Pkt    MOST    DNR    Gone from My Sight    Hard Choices      Time In Time Out Total Time Spent with Patient Total Overall Time   12 PM   1 PM   60 minutes   90 minutes    Greater than 50%  of this time was spent counseling and coordinating care related to the above assessment and plan.     1

## 2013-07-31 NOTE — Progress Notes (Signed)
Subjective: [name] is a 37 y.o. man with history of HIV (followed by Dr. 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  Patient seen at bedside this AM. Says he is feeling better today. Still complains of some abdominal pain, but no nausea or vomiting. Has some SOB and mild chest pain that he attributes to his lungs. No hemoptysis, melena, hematuria, or other signs of overt blood loss.   Patient presented on admission w/ Cr of 11.54, today it is 11.71 . Renal was consulted and suggests starting HD, but the patient still declines, saying he will still have to think about it. The team had a long discussion w/ him about his need to start HD, but he has a fear of needles and does not want to go to HD every other day. Scheduled to see palliative care this afternoon to discuss further goals of care.  On admission, the patient presented w/ Hb of 6.8. He was given 1 unit of PRBCs and his Hb went up to 7.4. Today his Hb is 7.2. He shows some worsening thrombocytopenia, decreased from 104 at admission to 93 today. Will continue to monitor for signs of bleeding. Patient is stable at this time.  Still not able to get any significant sputum for AFB and bacterial culture. Encouraged patient to produce some proper sputum and told him we will have to induce him w/ suction if none this AM.  Objective: Vital signs in last 24 hours: Filed Vitals:   07/30/13 1400 07/30/13 1647 07/30/13 2246 07/31/13 0621  BP: 111/80  102/72 106/63  Pulse: 116   91  Temp: 98.3 F (36.8 C) 100 F (37.8 C)  97.7 F (36.5 C)  TempSrc: Oral Oral  Oral  Resp: 20  20 20   Height:   6\' 2"  (1.88 m)   Weight:   195 lb 8 oz (88.678 kg)   SpO2: 93%  93% 93%   Weight change: 4.8 oz (0.136 kg)  Intake/Output Summary (Last 24 hours) at 07/31/13 1012 Last data  filed at 07/31/13 [id number]  Gross per 24 hour  Intake  802.5 ml  Output    450 ml  Net  352.5 ml   Physical Exam: General: Alert, cooperative, and in no apparent distress HEENT: Vision grossly intact, oropharynx clear and non-erythematous  Neck: Full range of motion without pain, supple, no lymphadenopathy or carotid bruits Lungs: Clear to ascultation bilaterally. Some mild wheezes and crackles in both lungs on auscultation.  Heart: Regular rate and rhythm, no murmurs, gallops, or rubs Abdomen: Distended, with tenderness to palpation. Mass palpated in R LQ, most likely part of his PCK. Liver palpated on exam. Bowel sounds present. Extremities: No cyanosis, clubbing, or edema Neurologic: Alert & oriented X3, cranial nerves II-XII intact, strength grossly intact, sensation intact to light touch  Lab Results: Basic Metabolic Panel:  Recent Labs Lab 07/30/13 1657 07/31/13 0545  NA 125* 123*  K 5.0 4.9  CL 86* 86*  CO2 14* 14*  GLUCOSE 139* 132*  BUN 119* 123*  CREATININE 11.34* 11.71*  CALCIUM 8.2* 7.9*   Liver Function Tests:  Recent Labs Lab 07/30/13 0500 07/31/13 0545  AST 225* 301*  ALT 115* 170*  ALKPHOS 42 38*  BILITOT 0.4 0.4  PROT 6.1 5.5*  ALBUMIN 2.7* 2.4*   No results found  for this basename: LIPASE, AMYLASE,  in the last 168 hours No results found for this basename: AMMONIA,  in the last 168 hours CBC:  Recent Labs Lab 07/29/13 1418  07/30/13 1657 07/31/13 0545  WBC 6.1  < > 5.9 4.6  NEUTROABS 5.1  --   --   --   HGB 6.8*  < > 7.6* 7.2*  HCT 19.8*  < > 21.7* 20.3*  MCV 108.8*  < > 103.8* 103.0*  PLT 104*  < > 96* 93*  < > = values in this interval not displayed. Coagulation:  Recent Labs Lab 07/29/13 1429  INR 3.8   Anemia Panel:  Recent Labs Lab 07/29/13 2310  VITAMINB12 482  FOLATE 16.0  FERRITIN 784*  TIBC 212*  IRON 14*  RETICCTPCT 0.8   Urine Drug Screen: Drugs of Abuse     Component Value Date/Time   LABOPIA NEG 03/26/2013  1519   LABOPIA POSITIVE* 09/22/2010 1140   COCAINSCRNUR NEG 03/26/2013 1519   COCAINSCRNUR NONE DETECTED 09/22/2010 1140   LABBENZ NEG 03/26/2013 1519   LABBENZ NEG 07/30/2011 1656   LABBENZ POSITIVE* 09/22/2010 1140   AMPHETMU NEG 07/30/2011 1656   AMPHETMU NONE DETECTED 09/22/2010 1140   THCU NEG 03/26/2013 1519   THCU NONE DETECTED 09/22/2010 1140   LABBARB NEG 03/26/2013 1519   LABBARB  Value: NONE DETECTED        DRUG SCREEN FOR MEDICAL PURPOSES ONLY.  IF CONFIRMATION IS NEEDED FOR ANY PURPOSE, NOTIFY LAB WITHIN 5 DAYS.        LOWEST DETECTABLE LIMITS FOR URINE DRUG SCREEN Drug Class       Cutoff (ng/mL) Amphetamine      1000 Barbiturate      200 Benzodiazepine   [id number] Tricyclics       [id number] Opiates          300 Cocaine          300 THC              50 09/22/2010 1140    Micro Results: Recent Results (from the past 240 hour(s))  CULTURE, BLOOD (SINGLE)     Status: None   Collection Time    07/29/13  2:18 PM      Result Value Range Status   Preliminary Report Blood Culture received; No Growth to date;   Preliminary   Preliminary Report Culture will be held for 5 days before issuing   Preliminary   Preliminary Report a Final Negative report.   Preliminary  MRSA PCR SCREENING     Status: None   Collection Time    07/29/13  6:15 PM      Result Value Range Status   MRSA by PCR NEGATIVE  NEGATIVE Final   Comment:            The GeneXpert MRSA Assay (FDA     approved for NASAL specimens     only), is one component of a     comprehensive MRSA colonization     surveillance program. It is not     intended to diagnose MRSA     infection nor to guide or     monitor treatment for     MRSA infections.  CULTURE, EXPECTORATED SPUTUM-ASSESSMENT     Status: None   Collection Time    07/30/13 12:45 AM      Result Value Range Status   Specimen Description SPUTUM   Final   Special Requests NONE   Final  Sputum evaluation     Final   Value: MICROSCOPIC FINDINGS SUGGEST THAT THIS SPECIMEN IS NOT  REPRESENTATIVE OF LOWER RESPIRATORY SECRETIONS. PLEASE RECOLLECT.     CALLED TO MOORE,K RN 07/30/2013 0347 JORDANS   Report Status 07/30/2013 FINAL   Final  CULTURE, EXPECTORATED SPUTUM-ASSESSMENT     Status: None   Collection Time    07/30/13  3:08 PM      Result Value Range Status   Specimen Description SPUTUM   Final   Special Requests NONE   Final   Sputum evaluation     Final   Value: MICROSCOPIC FINDINGS SUGGEST THAT THIS SPECIMEN IS NOT REPRESENTATIVE OF LOWER RESPIRATORY SECRETIONS. PLEASE RECOLLECT.     NOTIFIED A TONEY 1605 07/30/13 A BROWNING   Report Status 07/30/2013 FINAL   Final  CULTURE, EXPECTORATED SPUTUM-ASSESSMENT     Status: None   Collection Time    07/30/13  8:41 PM      Result Value Range Status   Specimen Description SPUTUM   Final   Special Requests NONE   Final   Sputum evaluation     Final   Value: MICROSCOPIC FINDINGS SUGGEST THAT THIS SPECIMEN IS NOT REPRESENTATIVE OF LOWER RESPIRATORY SECRETIONS. PLEASE RECOLLECT.     Osborne Casco RN 2235 07/30/13 A BROWNING   Report Status 07/30/2013 FINAL   Final   Studies/Results: Dg Chest 2 View  07/29/2013   *RADIOLOGY REPORT*  Clinical Data: Fever, night sweats, cough, congestion, hemoptysis. HIV.  Smoker.  CHEST - 2 VIEW  Comparison: Chest CT 01/22/2011.  Findings: There is air space consolidation in the superior segment right lower lobe.  There may be involvement of the posterior segment right upper lobe as well.  Lungs are otherwise clear.  No pleural fluid.  Heart size normal.  Trachea is midline.  IMPRESSION: Airspace consolidation in the superior segment right lower lobe and possibly posterior segment right upper lobe.  The findings are most consistent with pneumonia, possibly even active tuberculosis. These results will be called to the ordering clinician or representative by the Radiologist Assistant, and communication documented in the PACS Dashboard.   Original Report Authenticated By: [name], M.D.     Medications: I have reviewed the patient's current medications. Scheduled Meds: . azithromycin  500 mg Oral Q24H  . ceFEPime (MAXIPIME) IV  500 mg Intravenous Q24H  . efavirenz  600 mg Oral QHS  . lamiVUDine  100 mg Oral Daily  . nicotine  14 mg Transdermal QPC supper  . zidovudine  300 mg Oral Daily   Continuous Infusions: . dextrose 5 % 1,000 mL with sodium bicarbonate 150 mEq infusion 75 mL/hr at 07/30/13 2237   PRN Meds:.albuterol, ibuprofen, ondansetron (ZOFRAN) IV, ondansetron, oxyCODONE Assessment/Plan:  #CAP vs. HCAP - History of acute development of productive cough with CXR showing lobar consolidation are most suggestive of CAP, however, the patient was recently hospitalized in 05/2013, therefre this must be treated as HCAP. No WBC elevation, but a left shift was present on admission (85%). Borderline fevers noted (100.3-101.3 F) intially. Last fever was low grade at 100, yesterday (07/31/13) around 5:00 PM.  He endorses hemoptysis at home, however he describes the "chunks of blood" in his sputum as more brown than bright red. We have instructed him to save any sputum he produces to tease out whether this represents true hemoptysis vs. purulent sputum production. Will need to induce sputum production if still nothing this afternoon. He is supra therapeutic on his coumadin (INR  3.8), so hemoptysis simply 2/2 his CAP/HCAP is also possible. Tuberculosis is also a possibility. The location of his consolidation on CXR is appropriate for active TB. However, the acuity of his symptoms is less consistent with this diagnosis; he appears to have been well prior to Sunday, with no history of insidious low-grade fever, night sweats, weight loss, cough. His risk factors for TB include HIV infection and recent semi-homelessness 2/2 his building being condemned (though he has not been sleeping on the street or in shelters). He has never been incarcerated, denies travel, denies sick contacts.  For now,  the patient is still in a negative pressure room. Will reassess given if clinically improved on current antibiotic regimen. - AFB sputum culture, q8h x3; still w/ no proper sputum sample. - Started on Azithromycin 500 mg po qd, Vancomycin 1750 mg IVPB once (loading dose), plus Cefipime 500 mg IVPB qd for coverage of HCAP as patient was hospitalized recently in 05/2011. - Follow up blood and sputum cultures  - Legionella antigen still pending; strep pneumo antigen -ve - Albuterol inhaler prn wheezing  - Oxycodone 5-10mg  q6h prn pain  - Zofran prn nausea   #Macrocytic anemia - Hgb 6.8, MCV 108.8 on admission. Original differential diagnoses included acute blood loss (possible sources include sputum, stool, w/ +ve FOBT, urine, or internal hemorrhage as patient with multiple cysts in kidneys and liver, with some noted to have internal hyperdensities on CT 05/19/13 suggesting hemorrhage). Other causes of macrocytic anemia on the differential include drug induced anemia (on zidovidine), liver disease, alcohol abuse (patient claims he has not had alcohol in more than 3 months), folate or B12 deficiency (normal), hemolytic anemia (ruled out). Hb has stabilized at this time, currently 7.2. Last was 7.6. Received 1 unit of PBC's yesterday in the AM b/c his Hb was 6.8. Folate and B12 were both wnl. Anemia panel points more towards a mixed pictures at this time including liver disease w/ significant polycystic findings, ACD, and iron deficiency. Iron 14, TIBC 212, and Ferritin 784.  - Continue monitoring CBC, next in PM.  - Transfuse another unit of PRBC's if significant decline in Hb.  #Thrombocytopenia- Patient was admitted w/ platelets of 104, today they are 44. It is most likely that the patient has poor platelet production as well as poor platelet function 2/2 liver disease and significant azotemia. Patient is still refusing HD at this time, despite explanation of its usefulness for his clinical situation.  -  Continue to monitor for bleeding, watching for further decrease in platelets and decrease in Hb. - Also possible that we are seeing some DIC. This is unclear at this time as the patient has been on Coumadin at home, resulting in a supratherapeutic INR of 3.8 on admission. - Repeat PT/PTT/INR and CBC in PM.  #Transaminitis - AST/ALT 141/75, from 13/7 on 05/19/13. Patient does have a history of alcohol abuse, and his AST/ALT ratio are consistent with this, however he denies any recent alcohol abuse (none w/ in last several months) secondary to nausea. 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 He has physical exam findings suggestive of liver disease including asterixis and hepatomegaly. His bilirubin is not elevated. However, he most likely has reduced synthetic function given thrombocytopenia (93), elevated INR (3.8), and low albumin (2.9) on admission. Today his AST is 301, ALT 170, albumin 2.4, total protein 5.5, and total bili 0.4.  - Hepatitis serologies all negative. - Repeat CMET in  AM. - Avoiding medications with tylenol.  #Electrolyte abnormalities- BMP shows hyponatremia (123 today) and metabolic acidosis (AG 23) in this patient with vomiting and ESRD. We are limited in terms of IVF treatment options given ESRD.  - Sodium bicarb 163meq infusion @ 75cc/hr started yesterday, per renal recs.  #Supratherapeutic INR - 3.8 on admission. Likely 2/2 decreased po intake combined with ?decreased hepatic synthetic function. May have contributed to his possible bleeding issue at this time.  - Holding coumadin still at this point. Repeat coags in PM.  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 BUN 123. GFR via [place] Gault is 10.61. Still making urine. Dialysis was planned for next month, but patient is declining at this time. He has a mature AV fistula.  -Renal on board. Patient is refusing to start HD. He says he would rather slowly  decline in health rather than start HD d/t his fear of needles and refusal to comply with an outpatient regimen. Palliative care was consulted to discuss end of life care with him if he is refusing to start HD, given his poor state currently and poor outcome. His severe azotemia is most likely contributing to much of his clinical picture, given his platelet dysfunction, liver disease, anemia, and overall clinical picture.  #HTN - BP stable. 106/63 this AM - Holding home norvasc   #HIV - Stable. Followed by Dr. Linus Salmons, last CD4 580, viral load <20 in 6/14.  - Continue lamividine, zidovidine, efavirenz  Dispo: Disposition is deferred at this time, awaiting improvement of current medical problems.  Anticipated discharge in approximately 3-4 day(s).   The patient does have a current PCP [name], MD) and does need an [facility] hospital follow-up appointment after discharge.  The patient does not have transportation limitations that hinder transportation to clinic appointments.  .Services Needed at time of discharge: Y = Yes, Blank = No PT:   OT:   RN:   Equipment:   Other:     LOS: 2 days   Luanne Bras, MD 07/31/2013, 10:12 AM Pager: [phone]

## 2013-07-31 NOTE — Progress Notes (Signed)
[name] ROUNDING NOTE   Subjective:   Interval History: still coughing up blood. No specific complaints. Still refuses to allow dialysis due to fear of needles and anticipatory fear of coming to his treatments  Objective:  Vital signs in last 24 hours:  Temp:  [97.3 F (36.3 C)-100 F (37.8 C)] 97.3 F (36.3 C) (08/16 0900) Pulse Rate:  [91-116] 116 (08/16 0900) Resp:  [20] 20 (08/16 0900) BP: (102-111)/(63-80) 106/76 mmHg (08/16 0900) SpO2:  [93 %-94 %] 94 % (08/16 0900) Weight:  [88.678 kg (195 lb 8 oz)] 88.678 kg (195 lb 8 oz) (08/15 2246)  Weight change: 0.136 kg (4.8 oz) Filed Weights   07/29/13 2036 07/30/13 2246  Weight: 88.542 kg (195 lb 3.2 oz) 88.678 kg (195 lb 8 oz)    Intake/Output: I/O last 3 completed shifts: In: 620 [P.O.:120; I.V.:150; Blood:350] Out: 750 [Urine:750]   Intake/Output this shift:  Total I/O In: 1087.5 [P.O.:360; I.V.:727.5] Out: -   CVS- RRR RS- CTA ABD- BS present soft non-distended EXT- no edema   Basic Metabolic Panel:  Recent Labs Lab 07/29/13 1418 07/30/13 0500 07/30/13 1657 07/31/13 0545  NA 126* 126* 125* 123*  K 4.9 5.3* 5.0 4.9  CL 89* 89* 86* 86*  CO2 14* 13* 14* 14*  GLUCOSE 116* 132* 139* 132*  BUN 107* 116* 119* 123*  CREATININE 11.01* 11.54* 11.34* 11.71*  CALCIUM 8.3* 8.2* 8.2* 7.9*    Liver Function Tests:  Recent Labs Lab 07/29/13 2310 07/30/13 0500 07/31/13 0545  AST 141* 225* 301*  ALT 75* 115* 170*  ALKPHOS 42 42 38*  BILITOT 0.3 0.4 0.4  PROT 6.6 6.1 5.5*  ALBUMIN 2.9* 2.7* 2.4*   No results found for this basename: LIPASE, AMYLASE,  in the last 168 hours No results found for this basename: AMMONIA,  in the last 168 hours  CBC:  Recent Labs Lab 07/29/13 1418 07/30/13 0630 07/30/13 1657 07/31/13 0545  WBC 6.1 5.6 5.9 4.6  NEUTROABS 5.1  --   --   --   HGB 6.8* 7.4* 7.6* 7.2*  HCT 19.8* 21.9* 21.7* 20.3*  MCV 108.8* 106.3* 103.8* 103.0*  PLT 104* 98* 96* 93*     Cardiac Enzymes: No results found for this basename: CKTOTAL, CKMB, CKMBINDEX, TROPONINI,  in the last 168 hours  BNP: No components found with this basename: POCBNP,   CBG: No results found for this basename: GLUCAP,  in the last 168 hours  Microbiology: Results for orders placed during the hospital encounter of 07/29/13  MRSA PCR SCREENING     Status: None   Collection Time    07/29/13  6:15 PM      Result Value Range Status   MRSA by PCR NEGATIVE  NEGATIVE Final   Comment:            The GeneXpert MRSA Assay (FDA     approved for NASAL specimens     only), is one component of a     comprehensive MRSA colonization     surveillance program. It is not     intended to diagnose MRSA     infection nor to guide or     monitor treatment for     MRSA infections.  CULTURE, EXPECTORATED SPUTUM-ASSESSMENT     Status: None   Collection Time    07/30/13 12:45 AM      Result Value Range Status   Specimen Description SPUTUM   Final   Special Requests NONE  Final   Sputum evaluation     Final   Value: MICROSCOPIC FINDINGS SUGGEST THAT THIS SPECIMEN IS NOT REPRESENTATIVE OF LOWER RESPIRATORY SECRETIONS. PLEASE RECOLLECT.     CALLED TO MOORE,K RN 07/30/2013 0347 JORDANS   Report Status 07/30/2013 FINAL   Final  CULTURE, EXPECTORATED SPUTUM-ASSESSMENT     Status: None   Collection Time    07/30/13  3:08 PM      Result Value Range Status   Specimen Description SPUTUM   Final   Special Requests NONE   Final   Sputum evaluation     Final   Value: MICROSCOPIC FINDINGS SUGGEST THAT THIS SPECIMEN IS NOT REPRESENTATIVE OF LOWER RESPIRATORY SECRETIONS. PLEASE RECOLLECT.     NOTIFIED A TONEY 1605 07/30/13 A BROWNING   Report Status 07/30/2013 FINAL   Final  CULTURE, EXPECTORATED SPUTUM-ASSESSMENT     Status: None   Collection Time    07/30/13  8:41 PM      Result Value Range Status   Specimen Description SPUTUM   Final   Special Requests NONE   Final   Sputum evaluation     Final    Value: MICROSCOPIC FINDINGS SUGGEST THAT THIS SPECIMEN IS NOT REPRESENTATIVE OF LOWER RESPIRATORY SECRETIONS. PLEASE RECOLLECT.     Osborne Casco RN 2235 07/30/13 A BROWNING   Report Status 07/30/2013 FINAL   Final    Coagulation Studies:  Recent Labs  07/29/13 1429  INR 3.8    Urinalysis: No results found for this basename: COLORURINE, APPERANCEUR, LABSPEC, PHURINE, GLUCOSEU, HGBUR, BILIRUBINUR, KETONESUR, PROTEINUR, UROBILINOGEN, NITRITE, LEUKOCYTESUR,  in the last 72 hours    Imaging: Dg Chest 2 View  07/29/2013   *RADIOLOGY REPORT*  Clinical Data: Fever, night sweats, cough, congestion, hemoptysis. HIV.  Smoker.  CHEST - 2 VIEW  Comparison: Chest CT 01/22/2011.  Findings: There is air space consolidation in the superior segment right lower lobe.  There may be involvement of the posterior segment right upper lobe as well.  Lungs are otherwise clear.  No pleural fluid.  Heart size normal.  Trachea is midline.  IMPRESSION: Airspace consolidation in the superior segment right lower lobe and possibly posterior segment right upper lobe.  The findings are most consistent with pneumonia, possibly even active tuberculosis. These results will be called to the ordering clinician or representative by the Radiologist Assistant, and communication documented in the PACS Dashboard.   Original Report Authenticated By: [name], M.D.     Medications:   . dextrose 5 % 1,000 mL with sodium bicarbonate 150 mEq infusion 75 mL/hr at 07/30/13 2237   . azithromycin  500 mg Oral Q24H  . ceFEPime (MAXIPIME) IV  500 mg Intravenous Q24H  . efavirenz  600 mg Oral QHS  . lamiVUDine  100 mg Oral Daily  . nicotine  14 mg Transdermal QPC supper  . zidovudine  300 mg Oral Daily   albuterol, ibuprofen, ondansetron (ZOFRAN) IV, ondansetron, oxyCODONE  Assessment/ Plan:   37 y.o. 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 Followed by Dr Moshe Cipro in [place].  1. CKD 5. Patient refusing to start dialysis. Hospice referral made.  2. Anemia transfuse will start aranesp  3. Metabolic acidosis IV bicarbonate and start PO bicarbonate  A very unfortunate situation although from a nephrology perspective, there is very little I can do except  remind [name] that we would endeavor to keep him as comfortable as possible whether his decision is to attempt dialysis or to seek palliative care.      LOS: 2 Shenetta Schnackenberg W @TODAY @12 :24 PM

## 2013-07-31 NOTE — Progress Notes (Signed)
I saw and evaluated the patient.  I personally confirmed the key portions of the history and exam documented by Dr. Stann Mainland and I reviewed pertinent patient test results.  The assessment, diagnosis, and plan were formulated together and I agree with the documentation in the resident's note.  Pt is appropriate for admission.

## 2013-07-31 NOTE — Progress Notes (Signed)
Patient met with Dr Darrick Meigs, they have discussed hemodialysis and patient is now agreeable to try dialysis, but not until tomorrow after a "decent breakfast".  Pt examined and discussed issue of dialysis with him directly, will write orders for first dialysis tomorrow after breakfast.  No heparin (^INR 3.8, on coumadin for VTE), transfuse 1 unit prbc w HD tomorrow, UF 1-2kg as tolerated.   Kelly Splinter  MD Pager [phone]    Cell  [phone] 07/31/2013, 12:42 PM

## 2013-08-01 DIAGNOSIS — Z992 Dependence on renal dialysis: Secondary | ICD-10-CM

## 2013-08-01 LAB — COMPREHENSIVE METABOLIC PANEL
ALT: 258 U/L — ABNORMAL HIGH (ref 0–53)
AST: 430 U/L — ABNORMAL HIGH (ref 0–37)
CO2: 17 mEq/L — ABNORMAL LOW (ref 19–32)
Calcium: 8 mg/dL — ABNORMAL LOW (ref 8.4–10.5)
Chloride: 83 mEq/L — ABNORMAL LOW (ref 96–112)
GFR calc non Af Amer: 5 mL/min — ABNORMAL LOW (ref >90)
Sodium: 125 mEq/L — ABNORMAL LOW (ref 135–145)
Total Bilirubin: 0.4 mg/dL (ref 0.3–1.2)

## 2013-08-01 LAB — CBC WITH DIFFERENTIAL/PLATELET
Basophils Absolute: 0 10*3/uL (ref 0.0–0.1)
Lymphocytes Relative: 10 % — ABNORMAL LOW (ref 12–46)
Neutro Abs: 3.7 10*3/uL (ref 1.7–7.7)
Platelets: 99 10*3/uL — ABNORMAL LOW (ref 150–400)
RDW: 15.1 % (ref 11.5–15.5)
WBC: 4.1 10*3/uL (ref 4.0–10.5)

## 2013-08-01 LAB — PROTIME-INR: INR: 4.09 — ABNORMAL HIGH (ref 0.00–1.49)

## 2013-08-01 MED ORDER — PENTAFLUOROPROP-TETRAFLUOROETH EX AERO: 1.0000 "application " | INHALATION_SPRAY | CUTANEOUS | Status: DC | PRN

## 2013-08-01 MED ORDER — LIDOCAINE HCL (PF) 1 % IJ SOLN: 5.0000 mL | INTRAMUSCULAR | Status: DC | PRN

## 2013-08-01 MED ORDER — LIDOCAINE-PRILOCAINE 2.5-2.5 % EX CREA: 1.0000 "application " | TOPICAL_CREAM | CUTANEOUS | Status: DC | PRN

## 2013-08-01 MED ORDER — NEPRO/CARBSTEADY PO LIQD: 237.0000 mL | ORAL | Status: DC | PRN

## 2013-08-01 MED ORDER — VANCOMYCIN HCL 500 MG IV SOLR
500.0000 mg | Freq: Once | INTRAVENOUS | Status: AC
  Administered 2013-08-01: 500 mg via INTRAVENOUS
  Filled 2013-08-01: qty 500

## 2013-08-01 MED ORDER — SODIUM CHLORIDE 0.9 % IV SOLN: 100.0000 mL | INTRAVENOUS | Status: DC | PRN

## 2013-08-01 MED ORDER — LAMIVUDINE 10 MG/ML PO SOLN
50.0000 mg | Freq: Every day | ORAL | Status: DC
  Administered 2013-08-01 – 2013-08-06 (×6): 50 mg via ORAL
  Filled 2013-08-01 (×7): qty 5

## 2013-08-01 MED ORDER — HEPARIN SODIUM (PORCINE) 1000 UNIT/ML DIALYSIS
1000.0000 [IU] | INTRAMUSCULAR | Status: DC | PRN
  Filled 2013-08-01: qty 1

## 2013-08-01 MED ORDER — ALTEPLASE 2 MG IJ SOLR
2.0000 mg | Freq: Once | INTRAMUSCULAR | Status: AC | PRN
  Filled 2013-08-01: qty 2

## 2013-08-01 NOTE — Procedures (Signed)
I have seen and examined this patient and agree with the plan of care . Patient now agrees to dialysis. He is currently receiving his first treatment and tolerating it well. Will make outpatient plans for dialysis in AM.  [facility] W 08/01/2013, 10:40 AM
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
## 2013-08-01 NOTE — Progress Notes (Signed)
Subjective: [name] is a 37 y.o. man with history of HIV (followed by Dr. 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  - Patient finally agreed to do the HD. Will receive his first HD treatment today.  - No fever since 07/30/13. - INR 4.75. Coumadin still on hold - Hgb 7.2-->7.3. (Transfused one unit of blood). - Decreased his lamivudine dose by half per pharmacy. - AFB smear negative, culture pending - Cre:  Recent Labs Lab 07/29/13 1418 07/30/13 0500 07/30/13 1657 07/31/13 0545 08/01/13 0500  CREATININE 11.01* 11.54* 11.34* 11.71* 11.75*   Objective: Vital signs in last 24 hours: Filed Vitals:   08/01/13 1130 08/01/13 1148 08/01/13 1208 08/01/13 1232  BP: 105/73 111/74 120/78 123/78  Pulse: 94 110 85 94  Temp:    98 F (36.7 C)  TempSrc:    Oral  Resp: 16 16 16 16   Height:      Weight:      SpO2:    95%   Weight change: 4 lb 0.3 oz (1.822 kg)  Intake/Output Summary (Last 24 hours) at 08/01/13 1642 Last data filed at 08/01/13 1232  Gross per 24 hour  Intake 1847.5 ml  Output   1006 ml  Net  841.5 ml   Physical Exam: General: Alert, cooperative, and in no apparent distress HEENT: Vision grossly intact, oropharynx clear and non-erythematous  Neck: Full range of motion without pain, supple, no lymphadenopathy or carotid bruits Lungs: Clear to ascultation bilaterally. Some mild wheezes and crackles in both lungs on auscultation.  Heart: Regular rate and rhythm, no murmurs, gallops, or rubs Abdomen: Distended, with tenderness to palpation. Mass palpated in R LQ, most likely part of his PCK. Liver palpated on exam. Bowel sounds present. Extremities: No cyanosis, clubbing, or edema Neurologic: Alert & oriented X3, cranial nerves II-XII intact, strength grossly intact, sensation intact to light  touch  Lab Results: Basic Metabolic Panel:  Recent Labs Lab 07/31/13 0545 08/01/13 0500  NA 123* 125*  K 4.9 3.8  CL 86* 83*  CO2 14* 17*  GLUCOSE 132* 151*  BUN 123* 130*  CREATININE 11.71* 11.75*  CALCIUM 7.9* 8.0*   Liver Function Tests:  Recent Labs Lab 07/31/13 0545 08/01/13 0500  AST 301* 430*  ALT 170* 258*  ALKPHOS 38* 57  BILITOT 0.4 0.4  PROT 5.5* 5.8*  ALBUMIN 2.4* 2.4*   No results found for this basename: LIPASE, AMYLASE,  in the last 168 hours No results found for this basename: AMMONIA,  in the last 168 hours CBC:  Recent Labs Lab 07/29/13 1418  07/31/13 1632 08/01/13 0500  WBC 6.1  < > 6.0 4.1  NEUTROABS 5.1  --   --  3.7  HGB 6.8*  < > 7.3* 7.5*  HCT 19.8*  < > 20.0* 20.8*  MCV 108.8*  < > 101.0* 101.0*  PLT 104*  < > 108* 99*  < > = values in this interval not displayed. Coagulation:  Recent Labs Lab 07/29/13 1429 07/31/13 1632 08/01/13 0830  LABPROT  --  42.7* 38.1*  INR 3.8 4.75* 4.09*   Anemia Panel:  Recent Labs Lab 07/29/13 2310  VITAMINB12 482  FOLATE 16.0  FERRITIN 784*  TIBC 212*  IRON 14*  RETICCTPCT 0.8   Urine Drug Screen: Drugs of Abuse  Component Value Date/Time   LABOPIA NEG 03/26/2013 1519   LABOPIA POSITIVE* 09/22/2010 1140   COCAINSCRNUR NEG 03/26/2013 1519   COCAINSCRNUR NONE DETECTED 09/22/2010 1140   LABBENZ NEG 03/26/2013 1519   LABBENZ NEG 07/30/2011 1656   LABBENZ POSITIVE* 09/22/2010 1140   AMPHETMU NEG 07/30/2011 1656   AMPHETMU NONE DETECTED 09/22/2010 1140   THCU NEG 03/26/2013 1519   THCU NONE DETECTED 09/22/2010 1140   LABBARB NEG 03/26/2013 1519   LABBARB  Value: NONE DETECTED        DRUG SCREEN FOR MEDICAL PURPOSES ONLY.  IF CONFIRMATION IS NEEDED FOR ANY PURPOSE, NOTIFY LAB WITHIN 5 DAYS.        LOWEST DETECTABLE LIMITS FOR URINE DRUG SCREEN Drug Class       Cutoff (ng/mL) Amphetamine      1000 Barbiturate      200 Benzodiazepine   [id number] Tricyclics       [id number] Opiates          300 Cocaine           300 THC              50 09/22/2010 1140    Micro Results: Recent Results (from the past 240 hour(s))  CULTURE, BLOOD (SINGLE)     Status: None   Collection Time    07/29/13  2:18 PM      Result Value Range Status   Preliminary Report Blood Culture received; No Growth to date;   Preliminary   Preliminary Report Culture will be held for 5 days before issuing   Preliminary   Preliminary Report a Final Negative report.   Preliminary  MRSA PCR SCREENING     Status: None   Collection Time    07/29/13  6:15 PM      Result Value Range Status   MRSA by PCR NEGATIVE  NEGATIVE Final   Comment:            The GeneXpert MRSA Assay (FDA     approved for NASAL specimens     only), is one component of a     comprehensive MRSA colonization     surveillance program. It is not     intended to diagnose MRSA     infection nor to guide or     monitor treatment for     MRSA infections.  CULTURE, BLOOD (ROUTINE X 2)     Status: None   Collection Time    07/29/13 11:10 PM      Result Value Range Status   Specimen Description BLOOD LEFT ARM   Final   Special Requests BOTTLES DRAWN AEROBIC AND ANAEROBIC 10CC   Final   Culture  Setup Time     Final   Value: 07/30/2013 06:02     Performed at [facility]   Culture     Final   Value:        BLOOD CULTURE RECEIVED NO GROWTH TO DATE CULTURE WILL BE HELD FOR 5 DAYS BEFORE ISSUING A FINAL NEGATIVE REPORT     Performed at [facility]   Report Status PENDING   Incomplete  CULTURE, BLOOD (ROUTINE X 2)     Status: None   Collection Time    07/29/13 11:16 PM      Result Value Range Status   Specimen Description BLOOD LEFT HAND   Final   Special Requests BOTTLES DRAWN AEROBIC AND ANAEROBIC 5CC   Final   Culture  Setup Time  Final   Value: 07/30/2013 06:02     Performed at [facility]   Culture     Final   Value:        BLOOD CULTURE RECEIVED NO GROWTH TO DATE CULTURE WILL BE HELD FOR 5 DAYS BEFORE ISSUING A FINAL NEGATIVE  REPORT     Performed at [facility]   Report Status PENDING   Incomplete  CULTURE, EXPECTORATED SPUTUM-ASSESSMENT     Status: None   Collection Time    07/30/13 12:45 AM      Result Value Range Status   Specimen Description SPUTUM   Final   Special Requests NONE   Final   Sputum evaluation     Final   Value: MICROSCOPIC FINDINGS SUGGEST THAT THIS SPECIMEN IS NOT REPRESENTATIVE OF LOWER RESPIRATORY SECRETIONS. PLEASE RECOLLECT.     CALLED TO MOORE,K RN 07/30/2013 0347 JORDANS   Report Status 07/30/2013 FINAL   Final  CULTURE, EXPECTORATED SPUTUM-ASSESSMENT     Status: None   Collection Time    07/30/13  3:08 PM      Result Value Range Status   Specimen Description SPUTUM   Final   Special Requests NONE   Final   Sputum evaluation     Final   Value: MICROSCOPIC FINDINGS SUGGEST THAT THIS SPECIMEN IS NOT REPRESENTATIVE OF LOWER RESPIRATORY SECRETIONS. PLEASE RECOLLECT.     NOTIFIED A TONEY 1605 07/30/13 A BROWNING   Report Status 07/30/2013 FINAL   Final  AFB CULTURE WITH SMEAR     Status: None   Collection Time    07/30/13  3:08 PM      Result Value Range Status   Specimen Description SPUTUM   Final   Special Requests NONE   Final   ACID FAST SMEAR     Final   Value: NO ACID FAST BACILLI SEEN     Performed at [facility]   Culture     Final   Value: CULTURE WILL BE EXAMINED FOR 6 WEEKS BEFORE ISSUING A FINAL REPORT     Performed at [facility]   Report Status PENDING   Incomplete  CULTURE, EXPECTORATED SPUTUM-ASSESSMENT     Status: None   Collection Time    07/30/13  8:41 PM      Result Value Range Status   Specimen Description SPUTUM   Final   Special Requests NONE   Final   Sputum evaluation     Final   Value: MICROSCOPIC FINDINGS SUGGEST THAT THIS SPECIMEN IS NOT REPRESENTATIVE OF LOWER RESPIRATORY SECRETIONS. PLEASE RECOLLECT.     Osborne Casco RN 2235 07/30/13 A BROWNING   Report Status 07/30/2013 FINAL   Final  AFB CULTURE WITH SMEAR      Status: None   Collection Time    07/30/13  8:41 PM      Result Value Range Status   Specimen Description SPUTUM   Final   Special Requests Immunocompromised   Final   ACID FAST SMEAR     Final   Value: NO ACID FAST BACILLI SEEN     Performed at [facility]   Culture     Final   Value: CULTURE WILL BE EXAMINED FOR 6 WEEKS BEFORE ISSUING A FINAL REPORT     Performed at [facility]   Report Status PENDING   Incomplete  AFB CULTURE WITH SMEAR     Status: None   Collection Time    07/31/13  2:44  PM      Result Value Range Status   Specimen Description SPU   Final   Special Requests Immunocompromised   Final   ACID FAST SMEAR     Final   Value: NO ACID FAST BACILLI SEEN     Performed at [facility]   Culture     Final   Value: CULTURE WILL BE EXAMINED FOR 6 WEEKS BEFORE ISSUING A FINAL REPORT     Performed at [facility]   Report Status PENDING   Incomplete  CULTURE, RESPIRATORY (NON-EXPECTORATED)     Status: None   Collection Time    07/31/13  2:44 PM      Result Value Range Status   Specimen Description SPUTUM   Final   Special Requests NONE   Final   Gram Stain PENDING   Incomplete   Culture     Final   Value: Culture reincubated for better growth     Performed at [facility]   Report Status PENDING   Incomplete   Studies/Results: No results found. Medications: I have reviewed the patient's current medications. Scheduled Meds: . azithromycin  500 mg Oral Q24H  . ceFEPime (MAXIPIME) IV  500 mg Intravenous Q24H  . efavirenz  600 mg Oral QHS  . lamiVUDine  50 mg Oral Daily  . nicotine  14 mg Transdermal QPC supper  . polyethylene glycol  17 g Oral Daily  . sodium bicarbonate  650 mg Oral TID  . zidovudine  300 mg Oral Daily   Continuous Infusions: . dextrose 5 % 1,000 mL with sodium bicarbonate 150 mEq infusion 75 mL/hr at 07/31/13 2223   PRN Meds:.sodium chloride, sodium chloride, albuterol, alteplase, feeding supplement  (NEPRO CARB STEADY), heparin, ibuprofen, lidocaine (PF), lidocaine-prilocaine, ondansetron (ZOFRAN) IV, ondansetron, oxyCODONE, pentafluoroprop-tetrafluoroeth Assessment/Plan:  #CAP vs. HCAP - History of acute development of productive cough with CXR showing lobar consolidation are most suggestive of CAP, however, the patient was recently hospitalized in 05/2013, therefre this must be treated as HCAP. No WBC elevation, but a left shift was present on admission (85%). Borderline fevers noted (100.3-101.3 F) intially. Last fever was low grade at 100  (07/31/13) around 5:00 PM.  He endorses hemoptysis at home, however he describes the "chunks of blood" in his sputum as more brown than bright red. We have instructed him to save any sputum he produces to tease out whether this represents true hemoptysis vs. purulent sputum production. He is supra therapeutic on his coumadin (INR 3.8), so hemoptysis simply 2/2 his CAP/HCAP is also possible. Tuberculosis is also a possibility. The location of his consolidation on CXR is appropriate for active TB. However, the acuity of his symptoms is less consistent with this diagnosis; he appears to have been well prior to Sunday, with no history of insidious low-grade fever, night sweats, weight loss, cough. His risk factors for TB include HIV infection and recent semi-homelessness 2/2 his building being condemned (though he has not been sleeping on the street or in shelters). He has never been incarcerated, denies travel, denies sick contacts.  For now, the patient is still in a negative pressure room. Will reassess given if clinically improved on current antibiotic regimen. AFB smear negative and culture pending. Patient has been afebrile since 8/15. He is clinicaly improving. Blood culure no growth so far. Legionella antigen and strep pneumo antigen -ve.   - Patient is on Azithromycin 500 mg po qd; IV Vancomycin and Cefipime for HCAP.  - will  narrow down antibiotics tomorrow.  -  Follow up blood and sputum cultures - Albuterol inhaler prn wheezing  - Oxycodone 5-10mg  q6h prn pain  - Zofran prn nausea   #Macrocytic anemia - Hgb 6.8, MCV 108.8 on admission. Original differential diagnoses included acute blood loss (possible sources include sputum, stool, w/ +ve FOBT, urine, or internal hemorrhage as patient with multiple cysts in kidneys and liver, with some noted to have internal hyperdensities on CT 05/19/13 suggesting hemorrhage). Other causes of macrocytic anemia on the differential include drug induced anemia (on zidovidine), liver disease, alcohol abuse (patient claims he has not had alcohol in more than 3 months), folate or B12 deficiency (normal), hemolytic anemia (ruled out). Hb has stabilized at this time, currently 7.2. Last was 7.6. Received 1 unit of PBC's yesterday in the AM b/c his Hb was 6.8. Folate and B12 were both wnl. Anemia panel points more towards a mixed pictures at this time including liver disease w/ significant polycystic findings, ACD, and iron deficiency. Iron 14, TIBC 212, and Ferritin 784.   Recent Labs Lab 07/30/13 0630 07/30/13 1657 07/31/13 0545 07/31/13 1632 08/01/13 0500  HGB 7.4* 7.6* 7.2* 7.3* 7.5*   - Continue monitoring CBC.   #Thrombocytopenia- Patient was admitted w/ platelets of 104, today they are 108. It is most likely that the patient has poor platelet production as well as poor platelet function 2/2 liver disease and significant azotemia.  - Continue to monitor for bleeding, watching for further decrease in platelets and decrease in Hb.  #Transaminitis - AST/ALT 141/75, from 13/7 on 05/19/13. Patient does have a history of alcohol abuse, and his AST/ALT ratio are consistent with this, however he denies any recent alcohol abuse (none w/ in last several months) secondary to nausea. 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 He has physical exam findings suggestive of liver disease including  asterixis and hepatomegaly. His bilirubin is not elevated. However, he most likely has reduced synthetic function given thrombocytopenia (93), elevated INR, and low albumin (2.9) on admission. 8/16:  his AST is 340, ALT 258, albumin 2.4, total bili 0.4.  - Hepatitis serologies all negative. - Avoiding medications with tylenol.  #Electrolyte abnormalities- BMP shows hyponatremia (125 today) and metabolic acidosis (AG 25) in this patient with vomiting and ESRD. We are limited in terms of IVF treatment options given ESRD.  -patient will receive HD .  #Supratherapeutic INR - 3.8 on admission. Likely 2/2 decreased po intake combined with ?decreased hepatic synthetic function. May have contributed to his possible bleeding issue at this time.  Recent Labs Lab 07/29/13 1429 07/31/13 1632 08/01/13 0830  INR 3.8 4.75* 4.09*   - Holding coumadin still at this point. Repeat coags in AM.  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 BUN 123. GFR via [place]croft Gault is 10.61. Still making urine. Patient initially refused HD, but agreed to receive HD today. Renal will start HD today.   -Appreciate renal consult in managing our pt.   #HTN - BP stable. 94/63 this AM - Holding home norvasc   #HIV - Stable. Followed by Dr. Linus [name], last CD4 580, viral load <20 in 6/14.  - Continue lamividine, zidovidine, efavirenz - decreased lamividine dose by half per pharmacy  Dispo: Disposition is deferred at this time, awaiting improvement of current medical problems.  Anticipated discharge in approximately 2 to 3 day(s).   The patient does have a current PCP [name],  MD) and does need an [facility] hospital follow-up appointment after discharge.  The patient does not have transportation limitations that hinder transportation to clinic appointments.  .Services Needed at time of discharge: Y = Yes, Blank = No PT:   OT:   RN:   Equipment:   Other:     LOS: 3 days   Ivor Costa,  MD 08/01/2013, 4:42 PM Pager: [phone]

## 2013-08-02 DIAGNOSIS — J189 Pneumonia, unspecified organism: Secondary | ICD-10-CM

## 2013-08-02 DIAGNOSIS — Z515 Encounter for palliative care: Secondary | ICD-10-CM

## 2013-08-02 DIAGNOSIS — D539 Nutritional anemia, unspecified: Secondary | ICD-10-CM

## 2013-08-02 DIAGNOSIS — E872 Acidosis: Secondary | ICD-10-CM

## 2013-08-02 DIAGNOSIS — N186 End stage renal disease: Secondary | ICD-10-CM

## 2013-08-02 DIAGNOSIS — B20 Human immunodeficiency virus [HIV] disease: Secondary | ICD-10-CM

## 2013-08-02 LAB — HEPATIC FUNCTION PANEL
Alkaline Phosphatase: 74 U/L (ref 39–117)
Indirect Bilirubin: 0.2 mg/dL — ABNORMAL LOW (ref 0.3–0.9)
Total Protein: 5.7 g/dL — ABNORMAL LOW (ref 6.0–8.3)

## 2013-08-02 LAB — CBC
HCT: 19.4 % — ABNORMAL LOW (ref 39.0–52.0)
Hemoglobin: 6.5 g/dL — CL (ref 13.0–17.0)
Hemoglobin: 7.1 g/dL — ABNORMAL LOW (ref 13.0–17.0)
MCH: 36.7 pg — ABNORMAL HIGH (ref 26.0–34.0)
MCH: 36.4 pg — ABNORMAL HIGH (ref 26.0–34.0)
MCHC: 36.3 g/dL — ABNORMAL HIGH (ref 30.0–36.0)
MCHC: 36.6 g/dL — ABNORMAL HIGH (ref 30.0–36.0)
Platelets: 84 10*3/uL — ABNORMAL LOW (ref 150–400)

## 2013-08-02 LAB — RENAL FUNCTION PANEL
Albumin: 2.1 g/dL — ABNORMAL LOW (ref 3.5–5.2)
Calcium: 7.5 mg/dL — ABNORMAL LOW (ref 8.4–10.5)
GFR calc Af Amer: 8 mL/min — ABNORMAL LOW (ref >90)
GFR calc non Af Amer: 7 mL/min — ABNORMAL LOW (ref >90)
Phosphorus: 7.6 mg/dL — ABNORMAL HIGH (ref 2.3–4.6)
Potassium: 3.2 mEq/L — ABNORMAL LOW (ref 3.5–5.1)
Sodium: 127 mEq/L — ABNORMAL LOW (ref 135–145)

## 2013-08-02 LAB — PREPARE RBC (CROSSMATCH)

## 2013-08-02 LAB — PROTIME-INR
INR: 2.27 — ABNORMAL HIGH (ref 0.00–1.49)
Prothrombin Time: 24.3 seconds — ABNORMAL HIGH (ref 11.6–15.2)

## 2013-08-02 MED ORDER — PENTAFLUOROPROP-TETRAFLUOROETH EX AERO: 1.0000 "application " | INHALATION_SPRAY | CUTANEOUS | Status: DC | PRN

## 2013-08-02 MED ORDER — LIDOCAINE-PRILOCAINE 2.5-2.5 % EX CREA: 1.0000 "application " | TOPICAL_CREAM | CUTANEOUS | Status: DC | PRN

## 2013-08-02 MED ORDER — HEPARIN SODIUM (PORCINE) 1000 UNIT/ML DIALYSIS: 20.0000 [IU]/kg | INTRAMUSCULAR | Status: DC | PRN

## 2013-08-02 MED ORDER — NEPRO/CARBSTEADY PO LIQD: 237.0000 mL | ORAL | Status: DC | PRN

## 2013-08-02 MED ORDER — CALCIUM ACETATE 667 MG PO CAPS
1334.0000 mg | ORAL_CAPSULE | Freq: Three times a day (TID) | ORAL | Status: DC
  Administered 2013-08-02 – 2013-08-07 (×11): 1334 mg via ORAL
  Filled 2013-08-02 (×19): qty 2

## 2013-08-02 MED ORDER — LIDOCAINE HCL (PF) 1 % IJ SOLN: 5.0000 mL | INTRAMUSCULAR | Status: DC | PRN

## 2013-08-02 MED ORDER — ALTEPLASE 2 MG IJ SOLR: 2.0000 mg | Freq: Once | INTRAMUSCULAR | Status: DC | PRN

## 2013-08-02 MED ORDER — SODIUM CHLORIDE 0.9 % IV SOLN: 100.0000 mL | INTRAVENOUS | Status: DC | PRN

## 2013-08-02 MED ORDER — HEPARIN SODIUM (PORCINE) 1000 UNIT/ML DIALYSIS: 1000.0000 [IU] | INTRAMUSCULAR | Status: DC | PRN

## 2013-08-02 MED ORDER — SODIUM CHLORIDE 0.9 % IV SOLN
125.0000 mg | Freq: Every day | INTRAVENOUS | Status: AC
  Administered 2013-08-02 – 2013-08-06 (×5): 125 mg via INTRAVENOUS
  Filled 2013-08-02 (×10): qty 10

## 2013-08-02 MED ORDER — VANCOMYCIN HCL 500 MG IV SOLR
500.0000 mg | Freq: Once | INTRAVENOUS | Status: AC
  Administered 2013-08-02: 500 mg via INTRAVENOUS
  Filled 2013-08-02: qty 500

## 2013-08-02 MED ORDER — RENA-VITE PO TABS
1.0000 | ORAL_TABLET | Freq: Every day | ORAL | Status: DC
  Administered 2013-08-02 – 2013-08-06 (×5): 1 via ORAL
  Filled 2013-08-02 (×6): qty 1

## 2013-08-02 NOTE — Plan of Care (Signed)
Problem: Food- and Nutrition-Related Knowledge Deficit (NB-1.1) Goal: Nutrition education Formal process to instruct or train a patient/client in a skill or to impart knowledge to help patients/clients voluntarily manage or modify food choices and eating behavior to maintain or improve health. Outcome: Completed/Met Date Met:  08/02/13  Nutrition Education Note  RD consulted for Renal Education. Provided Choose-A-Meal Booklet to patient/family. Reviewed food groups and provided written recommended serving sizes specifically determined for patient's current nutritional status.   Explained why diet restrictions are needed and provided lists of foods to limit/avoid that are high potassium, sodium, and phosphorus. Provided specific recommendations on safer alternatives of these foods. Strongly encouraged compliance of this diet.   Discussed importance of protein intake at each meal and snack. Provided examples of how to maximize protein intake throughout the day. Discussed need for fluid restriction with dialysis, importance of minimizing weight gain between HD treatments, and renal-friendly beverage options.  Encouraged pt to discuss specific diet questions/concerns with RD at HD outpatient facility. Teach back method used.  Expect fair compliance.  Body mass index is 25.61 kg/(m^2). Pt meets criteria for Overweight based on current BMI.  Current diet order is Regular. Labs and medications reviewed. No further nutrition interventions warranted at this time. RD contact information provided. If additional nutrition issues arise, please re-consult RD.  Inda Coke MS, RD, LDN Pager: [phone] After-hours pager: [phone]

## 2013-08-02 NOTE — Progress Notes (Signed)
Subjective:   Feeling better, still have cough but is improving, not coughing up blood all the time. Dialysis went surprisingly well yesterday. No more nausea, able to eat well. No complaints.  HE actually looks fine- has appropriate questions about the future and about diet  Objective Filed Vitals:   08/01/13 2214 08/02/13 0126 08/02/13 0558 08/02/13 0802  BP: 102/65  105/70 106/72  Pulse: 101  95 106  Temp:  97.5 F (36.4 C) 98 F (36.7 C) 98.5 F (36.9 C)  TempSrc:  Oral Oral   Resp: 16  16 18   Height:      Weight:      SpO2: 93%  94% 90%   Physical Exam General: Alert and cooperative. No acute distress. Heart: RRR, no murmur Lungs: Clear with nonproductive cough (previously productive per pt) Abdomen: distended, generalized tenderness to palpation. + BS x4 Extremities: + 1 LE edema bilat Dialysis Access: RUA AVF +bruit and thrill- no infiltration  Assessment/Plan: 1. Cough/Fever- afebrile. On antibiotics, azithromycin, cefepime, vancomycin per primary team. Blood cultures no growth to date. AFB smear negative. Sputum cultures pending. Chest xray showed airspace consolidation in the superior segment right lower lobe and possibly posterior segment right upper lobe. The findings are most consistent with pneumonia, possibly even active tuberculosis. Remains in negative pressure room. TB skin test pending. 2. ESRD - first HD yesterday (8/17), 1 L off.  tolerated well. K+ 3.8. HD pending for today- continue low BFR with AVF- very little volume removal.  Will make arrangements to CLIP to [facility] as OP.  HD today and tomorrow, then to keep on TTS schedule as that will likely be his OP schedule.  3. Anemia -  hgb 7.5 s/p transfusion 8/14. Aranesp 141mcg Q week. Needs iron repletion as well, will give 4. Secondary hyperparathyroidism -  Ca+ 8.0, last phos 7.3 (6/23).  Will check phos here, will also add phoslo.  Last PTH was 262, no vitamin D as of yet 5. HTN/volume -  106/72 wt 20.5 kg, up 2 kgs  since admission. Not that volume overloaded- stop IVF 6. Nutrition - alb 2.4 on regular diet. Advised more protein- getting him dialysis will help 7. No longer needs sodium bicarb IV or PO since on HD  Shelle Iron, NP [place] [phone] 08/02/2013,9:00 AM  LOS: 4 days   Patient seen and examined, agree with above note with above modifications. 37 year old new start to HD with AVF in place- s/p first treatment yest, second today and third tomorrow- no BP agents needed- treating anemia with ESA and iron and also starting binder and renavite.  Looking to get him established at [facility] on [address].  PNA and rule out TB per primary team Corliss Parish, MD 08/02/2013      Additional Objective Labs: Basic Metabolic Panel:  Recent Labs Lab 07/30/13 1657 07/31/13 0545 08/01/13 0500  NA 125* 123* 125*  K 5.0 4.9 3.8  CL 86* 86* 83*  CO2 14* 14* 17*  GLUCOSE 139* 132* 151*  BUN 119* 123* 130*  CREATININE 11.34* 11.71* 11.75*  CALCIUM 8.2* 7.9* 8.0*   Liver Function Tests:  Recent Labs Lab 07/30/13 0500 07/31/13 0545 08/01/13 0500  AST 225* 301* 430*  ALT 115* 170* 258*  ALKPHOS 42 38* 57  BILITOT 0.4 0.4 0.4  PROT 6.1 5.5* 5.8*  ALBUMIN 2.7* 2.4* 2.4*   No results found for this basename: LIPASE, AMYLASE,  in the last 168 hours CBC:  Recent Labs Lab 07/29/13 1418 07/30/13 0630 07/30/13 1657 07/31/13 0545 07/31/13 1632 08/01/13 0500  WBC 6.1 5.6 5.9 4.6 6.0 4.1  NEUTROABS 5.1  --   --   --   --  3.7  HGB 6.8* 7.4* 7.6* 7.2* 7.3* 7.5*  HCT 19.8* 21.9* 21.7* 20.3* 20.0* 20.8*  MCV 108.8* 106.3* 103.8* 103.0* 101.0* 101.0*  PLT 104* 98* 96* 93* 108* 99*   Blood Culture    Component Value Date/Time   SDES SPU 07/31/2013 1444   SDES SPUTUM 07/31/2013 1444   SPECREQUEST Immunocompromised 07/31/2013 1444   SPECREQUEST NONE 07/31/2013 1444   CULT  Value: CULTURE WILL BE EXAMINED FOR 6 WEEKS BEFORE ISSUING A FINAL REPORT  Performed at [facility] 07/31/2013 1444   CULT  Value: Culture reincubated for better growth Performed at [facility] 07/31/2013 1444   REPTSTATUS PENDING 07/31/2013 1444   REPTSTATUS PENDING 07/31/2013 1444    Cardiac Enzymes: No results found for this basename: CKTOTAL, CKMB, CKMBINDEX, TROPONINI,  in the last 168 hours CBG: No results found for this basename: GLUCAP,  in the last 168 hours Iron Studies: No results found for this basename: IRON, TIBC, TRANSFERRIN, FERRITIN,  in the last 72 hours @lablastinr3 @ Studies/Results: No results found. Medications: . dextrose 5 % 1,000 mL with sodium bicarbonate 150 mEq infusion 75 mL/hr at 08/02/13 0553   . azithromycin  500 mg Oral Q24H  . ceFEPime (MAXIPIME) IV  500 mg Intravenous Q24H  . efavirenz  600 mg Oral QHS  . lamiVUDine  50 mg Oral Daily  . nicotine  14 mg Transdermal QPC supper  . polyethylene glycol  17 g Oral Daily  . sodium bicarbonate  650 mg Oral TID  . vancomycin  500 mg Intravenous Once in dialysis  . zidovudine  300 mg Oral Daily

## 2013-08-02 NOTE — Progress Notes (Signed)
[facility] Progress NOTE   Subjective: Patient is tired, + diarrhea, coughing blood still but not as much he thinks it is from his sinuses and ears. Abdominal pain noted with coughing      Current medications: Current Facility-Administered Medications  Medication Dose Route Frequency Provider Last Rate Last Dose  . 0.9 %  sodium chloride infusion  100 mL Intravenous PRN [name], MD      . 0.9 %  sodium chloride infusion  100 mL Intravenous PRN [name], MD      . albuterol (PROVENTIL HFA;VENTOLIN HFA) 108 (90 BASE) MCG/ACT inhaler 2 puff  2 puff Inhalation Q6H PRN Carlyle Basques, MD      . azithromycin [facility]) tablet 500 mg  500 mg Oral Q24H Otho Bellows, MD   500 mg at 08/01/13 2210  . ceFEPIme (MAXIPIME) 500 mg in dextrose 5 % 50 mL IVPB  500 mg Intravenous Q24H Rolla Flatten, RPH   500 mg at 08/01/13 1854  . dextrose 5 % 1,000 mL with sodium bicarbonate 150 mEq infusion   Intravenous Continuous Otho Bellows, MD 75 mL/hr at 08/02/13 [phone]    . efavirenz (SUSTIVA) tablet 600 mg  600 mg Oral QHS Otho Bellows, MD   600 mg at 08/01/13 2210  . feeding supplement (NEPRO CARB STEADY) liquid 237 mL  237 mL Oral PRN [name], MD      . heparin injection 1,000 Units  1,000 Units Dialysis PRN [name], MD      . ibuprofen (ADVIL,MOTRIN) tablet 600 mg  600 mg Oral Q6H PRN Carlyle Basques, MD   600 mg at 08/01/13 2020  . lamiVUDine (EPIVIR) 10 MG/ML solution 50 mg  50 mg Oral Daily Duwaine Maxin, DO   50 mg at 08/01/13 2020  . lidocaine (PF) (XYLOCAINE) 1 % injection 5 mL  5 mL Intradermal PRN [name], MD      . lidocaine-prilocaine (EMLA) cream 1 application  1 application Topical PRN [name], MD      . nicotine (NICODERM CQ - dosed in mg/24 hours) patch 14 mg  14 mg Transdermal QPC supper Luanne Bras, MD   14 mg at 08/01/13 1854  . ondansetron (ZOFRAN) tablet 4 mg  4 mg Oral Q6H PRN Carlyle Basques, MD       Or  .  ondansetron [facility]) injection 4 mg  4 mg Intravenous Q6H PRN Carlyle Basques, MD      . oxyCODONE (Oxy IR/ROXICODONE) immediate release tablet 5-10 mg  5-10 mg Oral Q6H PRN Carlyle Basques, MD   10 mg at 08/02/13 0553  . pentafluoroprop-tetrafluoroeth (GEBAUERS) aerosol 1 application  1 application Topical PRN [name], MD      . polyethylene glycol (MIRALAX / GLYCOLAX) packet 17 g  17 g Oral Daily Blain Pais, MD   17 g at 08/01/13 1530  . sodium bicarbonate tablet 650 mg  650 mg Oral TID Sherril Croon, MD   650 mg at 08/01/13 2210  . vancomycin (VANCOCIN) 500 mg in sodium chloride 0.9 % 100 mL IVPB  500 mg Intravenous Once in dialysis Radha M Patel, RPH      . zidovudine (RETROVIR) capsule 300 mg  300 mg Oral Daily Dominic Pea, DO   300 mg at 08/01/13 1944     Vital Signs: Blood pressure 106/72, pulse 106, temperature 98.5 F (36.9 C), temperature source Oral, resp. rate 18, height 6'  2" (1.88 m), weight 199 lb 8.3 oz (90.5 kg), SpO2 90.00%.  Weight trends: Filed Weights   07/29/13 2036 07/30/13 2246 07/31/13 2137  Weight: 195 lb 3.2 oz (88.542 kg) 195 lb 8 oz (88.678 kg) 199 lb 8.3 oz (90.5 kg)    Physical Exam: General: Vital signs reviewed and noted. Thin, no acute distress; alert, appropriate and cooperative throughout examination.  Lungs:  cta left, decreased aeration on the right  Heart: RRR no murmurs  Abdomen:  BS normoactive. Distended though decreased with ascites and hepatomegaly, generalized ttp seems MSK  Extremities: 1+ edema LLE, no cyanosis or edema otherwise   Neurologic: A&O X3, grossly neurologically intact  Skin: No visible rashes, scars. Tattoos to skin    Lab results: Basic Metabolic Panel:  Recent Labs Lab 07/30/13 1657 07/31/13 0545 08/01/13 0500  NA 125* 123* 125*  K 5.0 4.9 3.8  CL 86* 86* 83*  CO2 14* 14* 17*  GLUCOSE 139* 132* 151*  BUN 119* 123* 130*  CREATININE 11.34* 11.71* 11.75*  CALCIUM 8.2* 7.9* 8.0*    Liver  Function Tests:  Recent Labs Lab 07/30/13 0500 07/31/13 0545 08/01/13 0500  AST 225* 301* 430*  ALT 115* 170* 258*  ALKPHOS 42 38* 57  BILITOT 0.4 0.4 0.4  PROT 6.1 5.5* 5.8*  ALBUMIN 2.7* 2.4* 2.4*    CBC:  Recent Labs Lab 07/29/13 1418 07/30/13 0630 07/30/13 1657 07/31/13 0545 07/31/13 1632 08/01/13 0500  WBC 6.1 5.6 5.9 4.6 6.0 4.1  NEUTROABS 5.1  --   --   --   --  3.7  HGB 6.8* 7.4* 7.6* 7.2* 7.3* 7.5*  HCT 19.8* 21.9* 21.7* 20.3* 20.0* 20.8*  MCV 108.8* 106.3* 103.8* 103.0* 101.0* 101.0*  PLT 104* 98* 96* 93* 108* 99*     Microbiology: Results for orders placed during the hospital encounter of 07/29/13  MRSA PCR SCREENING     Status: None   Collection Time    07/29/13  6:15 PM      Result Value Range Status   MRSA by PCR NEGATIVE  NEGATIVE Final   Comment:            The GeneXpert MRSA Assay (FDA     approved for NASAL specimens     only), is one component of a     comprehensive MRSA colonization     surveillance program. It is not     intended to diagnose MRSA     infection nor to guide or     monitor treatment for     MRSA infections.  CULTURE, BLOOD (ROUTINE X 2)     Status: None   Collection Time    07/29/13 11:10 PM      Result Value Range Status   Specimen Description BLOOD LEFT ARM   Final   Special Requests BOTTLES DRAWN AEROBIC AND ANAEROBIC 10CC   Final   Culture  Setup Time     Final   Value: 07/30/2013 06:02     Performed at [facility]   Culture     Final   Value:        BLOOD CULTURE RECEIVED NO GROWTH TO DATE CULTURE WILL BE HELD FOR 5 DAYS BEFORE ISSUING A FINAL NEGATIVE REPORT     Performed at [facility]   Report Status PENDING   Incomplete  CULTURE, BLOOD (ROUTINE X 2)     Status: None   Collection Time    07/29/13 11:16 PM  Result Value Range Status   Specimen Description BLOOD LEFT HAND   Final   Special Requests BOTTLES DRAWN AEROBIC AND ANAEROBIC 5CC   Final   Culture  Setup Time     Final    Value: 07/30/2013 06:02     Performed at [facility]   Culture     Final   Value:        BLOOD CULTURE RECEIVED NO GROWTH TO DATE CULTURE WILL BE HELD FOR 5 DAYS BEFORE ISSUING A FINAL NEGATIVE REPORT     Performed at [facility]   Report Status PENDING   Incomplete  CULTURE, EXPECTORATED SPUTUM-ASSESSMENT     Status: None   Collection Time    07/30/13 12:45 AM      Result Value Range Status   Specimen Description SPUTUM   Final   Special Requests NONE   Final   Sputum evaluation     Final   Value: MICROSCOPIC FINDINGS SUGGEST THAT THIS SPECIMEN IS NOT REPRESENTATIVE OF LOWER RESPIRATORY SECRETIONS. PLEASE RECOLLECT.     CALLED TO MOORE,K RN 07/30/2013 0347 JORDANS   Report Status 07/30/2013 FINAL   Final  CULTURE, EXPECTORATED SPUTUM-ASSESSMENT     Status: None   Collection Time    07/30/13  3:08 PM      Result Value Range Status   Specimen Description SPUTUM   Final   Special Requests NONE   Final   Sputum evaluation     Final   Value: MICROSCOPIC FINDINGS SUGGEST THAT THIS SPECIMEN IS NOT REPRESENTATIVE OF LOWER RESPIRATORY SECRETIONS. PLEASE RECOLLECT.     NOTIFIED A TONEY 1605 07/30/13 A BROWNING   Report Status 07/30/2013 FINAL   Final  AFB CULTURE WITH SMEAR     Status: None   Collection Time    07/30/13  3:08 PM      Result Value Range Status   Specimen Description SPUTUM   Final   Special Requests NONE   Final   ACID FAST SMEAR     Final   Value: NO ACID FAST BACILLI SEEN     Performed at [facility]   Culture     Final   Value: CULTURE WILL BE EXAMINED FOR 6 WEEKS BEFORE ISSUING A FINAL REPORT     Performed at [facility]   Report Status PENDING   Incomplete  CULTURE, EXPECTORATED SPUTUM-ASSESSMENT     Status: None   Collection Time    07/30/13  8:41 PM      Result Value Range Status   Specimen Description SPUTUM   Final   Special Requests NONE   Final   Sputum evaluation     Final   Value: MICROSCOPIC FINDINGS SUGGEST THAT  THIS SPECIMEN IS NOT REPRESENTATIVE OF LOWER RESPIRATORY SECRETIONS. PLEASE RECOLLECT.     Osborne Casco RN [id number] 07/30/13 A BROWNING   Report Status 07/30/2013 FINAL   Final  AFB CULTURE WITH SMEAR     Status: None   Collection Time    07/30/13  8:41 PM      Result Value Range Status   Specimen Description SPUTUM   Final   Special Requests Immunocompromised   Final   ACID FAST SMEAR     Final   Value: NO ACID FAST BACILLI SEEN     Performed at [facility]   Culture     Final   Value: CULTURE WILL BE EXAMINED FOR 6 WEEKS BEFORE ISSUING A FINAL REPORT  Performed at [facility]   Report Status PENDING   Incomplete  AFB CULTURE WITH SMEAR     Status: None   Collection Time    07/31/13  2:44 PM      Result Value Range Status   Specimen Description SPU   Final   Special Requests Immunocompromised   Final   ACID FAST SMEAR     Final   Value: NO ACID FAST BACILLI SEEN     Performed at [facility]   Culture     Final   Value: CULTURE WILL BE EXAMINED FOR 6 WEEKS BEFORE ISSUING A FINAL REPORT     Performed at [facility]   Report Status PENDING   Incomplete  CULTURE, RESPIRATORY (NON-EXPECTORATED)     Status: None   Collection Time    07/31/13  2:44 PM      Result Value Range Status   Specimen Description SPUTUM   Final   Special Requests NONE   Final   Gram Stain PENDING   Incomplete   Culture     Final   Value: NORMAL OROPHARYNGEAL FLORA     Performed at [facility]   Report Status PENDING   Incomplete    Coagulation Studies:  Recent Labs  07/31/13 1632 08/01/13 0830  LABPROT 42.7* 38.1*  INR 4.75* 4.09*    Urinalysis: No results found for this basename: COLORURINE, APPERANCEUR, LABSPEC, PHURINE, GLUCOSEU, HGBUR, BILIRUBINUR, KETONESUR, PROTEINUR, UROBILINOGEN, NITRITE, LEUKOCYTESUR,  in the last 72 hours    Imaging: No results found.   Assessment & Plan: [name]  37 y.o. yo male with a PMHX of CKD stage 5 with  AVF right extremity (not yet started HD), medication noncompliance, HTN, h/o tobacco abuse and alcohol abuse, h/o anemia, DVT/PE, controlled HIV, was admitted to [facility] on 07/29/2013 with  anemia(HBG 6.8) s/p transfusion, FOBT+, pneumonia on CXR and concern for TB, elevated transaminases/INR, hyponatremia, acute on chronic renal failure with history of nausea/vomiting/diarrhea/fever/chills prior to admission    1. CKD stage 5 secondary to polycystic kidney disease with acute on chronic renal  -Creatinine 11.01 on admission with baseline 7-8.  Elevation  could be secondary to volume depletion (i.e decreased oral intake), diarrhea, vomiting prior to admission.  -renal following nephrologist is Dr. Moshe Cipro outpatient.  -Received HD 8/17 with 1L off, will get HD today  -trend renal function panel (pending) and continue IVF  2. Macrocytic and Fe def. Anemia -Baseline hemoglobin 8-9 Status post transfusion this admission -trend CBC (pending)  3. Pneumonia likely HAP -Antibiotics currently AZM, Cefepime  4. Suspected TB on CXR -on airborne precautions.  Pending sputum cultures so far NTD but not adequate speciman  5. Elevated transaminases -Hepatitis panel was negative.   6. Likely metabolic acidosis with elevated AG (23) on admission  -Likely due to CKD. ON D5 150 meQ 75 cc/hr  7.Supratherapeutic INR -pending INR  8. Controlled HIV  -CD4 580 in 05/2013 with undetectable viral load -Follows with ID Dr. Linus Salmons and of HAART  9. HTN -Blood pressure normotensive. Continue to monitor   10. F/E/N -D5 w/ 3 amps of bicarbonate 75 cc/hr -Hyponatremia asymptomatic, could be secondary to dehydration or underling infection on IVF. Pending Renal function panel today -Renal diet   11. DVT PPX  -scds   Patient seen and examined, agree with above note with above modifications.  Corliss Parish, MD 08/03/2013

## 2013-08-02 NOTE — Progress Notes (Signed)
Internal Medicine Attending  Date: 08/02/2013  Patient name: Brian [name] Medical record number: [id number] Date of birth: 1976/03/17 Age: 37 y.o. Gender: male  I saw and evaluated the patient. I reviewed the resident's note by Dr. Ronnald Ramp and I agree with the resident's findings and plans as documented in his note.

## 2013-08-02 NOTE — Progress Notes (Signed)
CRITICAL VALUE ALERT  Critical value received: Hgb 6.5  Date of notification:  08/02/13  Time of notification: 12:00  Critical value read back:yes  Nurse who received alert: Marlis Edelson  MD notified (1st page):  Dr. Ronnald Ramp  Time of first page:  12:06  MD notified (2nd page): N/A  Time of second page:  Responding MD: Dr. Ronnald Ramp  Time MD responded: 12:06

## 2013-08-02 NOTE — Progress Notes (Signed)
Subjective: [name] is a 37 y.o. man with history of HIV (followed by Dr. 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  Patient seen at bedside this AM. Says he is feeling better today. Had HD yesterday and he said "it wasn't as scary as I thought". Only small volume was removed from patient yesterday, for further HD today. Denies any fever, chills, nausea, vomiting, SOB, or chest pain. Still complains of cough, not as frequent as before.   Today, patient's Hb found to be 6.5, down from 7.5 yesterday. Ordered 1 unit PRBC's. Consulted GI to rule out possible GIB. Does not suspect GIB w/ no recommendations for EGD or colonoscopy.  AFB smear -ve, culture still pending.   Objective: Vital signs in last 24 hours: Filed Vitals:   08/01/13 1702 08/01/13 2214 08/02/13 0126 08/02/13 0558  BP: 120/78 102/65  105/70  Pulse: 86 101  95  Temp: 98.2 F (36.8 C)  97.5 F (36.4 C) 98 F (36.7 C)  TempSrc: Oral  Oral Oral  Resp: 17 16  16   Height:      Weight:      SpO2: 99% 93%  94%   Weight change:   Intake/Output Summary (Last 24 hours) at 08/02/13 0600 Last data filed at 08/01/13 2212  Gross per 24 hour  Intake    900 ml  Output   1002 ml  Net   -102 ml   Physical Exam: General: Alert, cooperative, and in no apparent distress HEENT: Vision grossly intact, oropharynx clear and non-erythematous  Neck: Full range of motion without pain, supple, no lymphadenopathy or carotid bruits Lungs: Clear to ascultation bilaterally. Some mild wheezes and crackles in both lungs on auscultation.  Heart: Regular rate and rhythm, no murmurs, gallops, or rubs Abdomen: Distended, with tenderness to palpation. Mass palpated in R LQ, most likely part of his PCK. Liver palpated on exam. Bowel sounds present. Extremities: No cyanosis,  clubbing, or edema Neurologic: Alert & oriented X3, cranial nerves II-XII intact, strength grossly intact, sensation intact to light touch  Lab Results: Basic Metabolic Panel:  Recent Labs Lab 07/31/13 0545 08/01/13 0500  NA 123* 125*  K 4.9 3.8  CL 86* 83*  CO2 14* 17*  GLUCOSE 132* 151*  BUN 123* 130*  CREATININE 11.71* 11.75*  CALCIUM 7.9* 8.0*   Liver Function Tests:  Recent Labs Lab 07/31/13 0545 08/01/13 0500  AST 301* 430*  ALT 170* 258*  ALKPHOS 38* 57  BILITOT 0.4 0.4  PROT 5.5* 5.8*  ALBUMIN 2.4* 2.4*   No results found for this basename: LIPASE, AMYLASE,  in the last 168 hours No results found for this basename: AMMONIA,  in the last 168 hours CBC:  Recent Labs Lab 07/29/13 1418  07/31/13 1632 08/01/13 0500  WBC 6.1  < > 6.0 4.1  NEUTROABS 5.1  --   --  3.7  HGB 6.8*  < > 7.3* 7.5*  HCT 19.8*  < > 20.0* 20.8*  MCV 108.8*  < > 101.0* 101.0*  PLT 104*  < > 108* 99*  < > = values in this interval not displayed. Coagulation:  Recent Labs Lab 07/29/13 1429 07/31/13 1632 08/01/13 0830  LABPROT  --  42.7* 38.1*  INR 3.8 4.75* 4.09*   Anemia Panel:  Recent Labs Lab 07/29/13 2310  VITAMINB12 482  FOLATE 16.0  FERRITIN 784*  TIBC 212*  IRON 14*  RETICCTPCT 0.8   Urine Drug Screen: Drugs of Abuse     Component Value Date/Time   LABOPIA NEG 03/26/2013 1519   LABOPIA POSITIVE* 09/22/2010 1140   COCAINSCRNUR NEG 03/26/2013 1519   COCAINSCRNUR NONE DETECTED 09/22/2010 1140   LABBENZ NEG 03/26/2013 1519   LABBENZ NEG 07/30/2011 1656   LABBENZ POSITIVE* 09/22/2010 1140   AMPHETMU NEG 07/30/2011 1656   AMPHETMU NONE DETECTED 09/22/2010 1140   THCU NEG 03/26/2013 1519   THCU NONE DETECTED 09/22/2010 1140   LABBARB NEG 03/26/2013 1519   LABBARB  Value: NONE DETECTED        DRUG SCREEN FOR MEDICAL PURPOSES ONLY.  IF CONFIRMATION IS NEEDED FOR ANY PURPOSE, NOTIFY LAB WITHIN 5 DAYS.        LOWEST DETECTABLE LIMITS FOR URINE DRUG SCREEN Drug Class        Cutoff (ng/mL) Amphetamine      1000 Barbiturate      200 Benzodiazepine   [id number] Tricyclics       [id number] Opiates          300 Cocaine          300 THC              50 09/22/2010 1140    Micro Results: Recent Results (from the past 240 hour(s))  CULTURE, BLOOD (SINGLE)     Status: None   Collection Time    07/29/13  2:18 PM      Result Value Range Status   Preliminary Report Blood Culture received; No Growth to date;   Preliminary   Preliminary Report Culture will be held for 5 days before issuing   Preliminary   Preliminary Report a Final Negative report.   Preliminary  MRSA PCR SCREENING     Status: None   Collection Time    07/29/13  6:15 PM      Result Value Range Status   MRSA by PCR NEGATIVE  NEGATIVE Final   Comment:            The GeneXpert MRSA Assay (FDA     approved for NASAL specimens     only), is one component of a     comprehensive MRSA colonization     surveillance program. It is not     intended to diagnose MRSA     infection nor to guide or     monitor treatment for     MRSA infections.  CULTURE, BLOOD (ROUTINE X 2)     Status: None   Collection Time    07/29/13 11:10 PM      Result Value Range Status   Specimen Description BLOOD LEFT ARM   Final   Special Requests BOTTLES DRAWN AEROBIC AND ANAEROBIC 10CC   Final   Culture  Setup Time     Final   Value: 07/30/2013 06:02     Performed at [facility]   Culture     Final   Value:        BLOOD CULTURE RECEIVED NO GROWTH TO DATE CULTURE WILL BE HELD FOR 5 DAYS BEFORE ISSUING A FINAL NEGATIVE REPORT     Performed at [facility]   Report Status PENDING   Incomplete  CULTURE, BLOOD (ROUTINE X 2)     Status: None   Collection Time    07/29/13 11:16 PM      Result Value Range Status  Specimen Description BLOOD LEFT HAND   Final   Special Requests BOTTLES DRAWN AEROBIC AND ANAEROBIC 5CC   Final   Culture  Setup Time     Final   Value: 07/30/2013 06:02     Performed at [facility]   Culture      Final   Value:        BLOOD CULTURE RECEIVED NO GROWTH TO DATE CULTURE WILL BE HELD FOR 5 DAYS BEFORE ISSUING A FINAL NEGATIVE REPORT     Performed at [facility]   Report Status PENDING   Incomplete  CULTURE, EXPECTORATED SPUTUM-ASSESSMENT     Status: None   Collection Time    07/30/13 12:45 AM      Result Value Range Status   Specimen Description SPUTUM   Final   Special Requests NONE   Final   Sputum evaluation     Final   Value: MICROSCOPIC FINDINGS SUGGEST THAT THIS SPECIMEN IS NOT REPRESENTATIVE OF LOWER RESPIRATORY SECRETIONS. PLEASE RECOLLECT.     CALLED TO MOORE,K RN 07/30/2013 0347 JORDANS   Report Status 07/30/2013 FINAL   Final  CULTURE, EXPECTORATED SPUTUM-ASSESSMENT     Status: None   Collection Time    07/30/13  3:08 PM      Result Value Range Status   Specimen Description SPUTUM   Final   Special Requests NONE   Final   Sputum evaluation     Final   Value: MICROSCOPIC FINDINGS SUGGEST THAT THIS SPECIMEN IS NOT REPRESENTATIVE OF LOWER RESPIRATORY SECRETIONS. PLEASE RECOLLECT.     NOTIFIED A TONEY 1605 07/30/13 A BROWNING   Report Status 07/30/2013 FINAL   Final  AFB CULTURE WITH SMEAR     Status: None   Collection Time    07/30/13  3:08 PM      Result Value Range Status   Specimen Description SPUTUM   Final   Special Requests NONE   Final   ACID FAST SMEAR     Final   Value: NO ACID FAST BACILLI SEEN     Performed at [facility]   Culture     Final   Value: CULTURE WILL BE EXAMINED FOR 6 WEEKS BEFORE ISSUING A FINAL REPORT     Performed at [facility]   Report Status PENDING   Incomplete  CULTURE, EXPECTORATED SPUTUM-ASSESSMENT     Status: None   Collection Time    07/30/13  8:41 PM      Result Value Range Status   Specimen Description SPUTUM   Final   Special Requests NONE   Final   Sputum evaluation     Final   Value: MICROSCOPIC FINDINGS SUGGEST THAT THIS SPECIMEN IS NOT REPRESENTATIVE OF LOWER RESPIRATORY SECRETIONS. PLEASE  RECOLLECT.     Osborne Casco RN [id number] 07/30/13 A BROWNING   Report Status 07/30/2013 FINAL   Final  AFB CULTURE WITH SMEAR     Status: None   Collection Time    07/30/13  8:41 PM      Result Value Range Status   Specimen Description SPUTUM   Final   Special Requests Immunocompromised   Final   ACID FAST SMEAR     Final   Value: NO ACID FAST BACILLI SEEN     Performed at [facility]   Culture     Final   Value: CULTURE WILL BE EXAMINED FOR 6 WEEKS BEFORE ISSUING A FINAL REPORT     Performed at [facility]   Report Status PENDING   Incomplete  AFB CULTURE WITH SMEAR     Status: None   Collection Time    07/31/13  2:44 PM      Result Value Range Status   Specimen Description SPU   Final   Special Requests Immunocompromised   Final   ACID FAST SMEAR     Final   Value: NO ACID FAST BACILLI SEEN     Performed at [facility]   Culture     Final   Value: CULTURE WILL BE EXAMINED FOR 6 WEEKS BEFORE ISSUING A FINAL REPORT     Performed at [facility]   Report Status PENDING   Incomplete  CULTURE, RESPIRATORY (NON-EXPECTORATED)     Status: None   Collection Time    07/31/13  2:44 PM      Result Value Range Status   Specimen Description SPUTUM   Final   Special Requests NONE   Final   Gram Stain PENDING   Incomplete   Culture     Final   Value: Culture reincubated for better growth     Performed at [facility]   Report Status PENDING   Incomplete   Studies/Results: No results found. Medications: I have reviewed the patient's current medications. Scheduled Meds: . azithromycin  500 mg Oral Q24H  . ceFEPime (MAXIPIME) IV  500 mg Intravenous Q24H  . efavirenz  600 mg Oral QHS  . lamiVUDine  50 mg Oral Daily  . nicotine  14 mg Transdermal QPC supper  . polyethylene glycol  17 g Oral Daily  . sodium bicarbonate  650 mg Oral TID  . zidovudine  300 mg Oral Daily   Continuous Infusions: . dextrose 5 % 1,000 mL with sodium bicarbonate  150 mEq infusion 75 mL/hr at 08/02/13 0553   PRN Meds:.sodium chloride, sodium chloride, albuterol, feeding supplement (NEPRO CARB STEADY), heparin, ibuprofen, lidocaine (PF), lidocaine-prilocaine, ondansetron (ZOFRAN) IV, ondansetron, oxyCODONE, pentafluoroprop-tetrafluoroeth Assessment/Plan:  #CAP vs. HCAP - History of acute development of productive cough with CXR showing lobar consolidation are most suggestive of CAP, however, the patient was recently hospitalized in 05/2013, therefre this must be treated as HCAP. No WBC elevation, but a left shift was present on admission (85%). Borderline fevers noted (100.3-101.3 F) intially. Last fever was low grade at 100, yesterday (07/31/13) around 5:00 PM.  He endorses hemoptysis at home, however he describes the "chunks of blood" in his sputum as more brown than bright red. We have instructed him to save any sputum he produces to tease out whether this represents true hemoptysis vs. purulent sputum production. Will need to induce sputum production if still nothing this afternoon. He is supra therapeutic on his coumadin (INR 3.8), so hemoptysis simply 2/2 his CAP/HCAP is also possible. Tuberculosis is also a possibility. The location of his consolidation on CXR is appropriate for active TB. However, the acuity of his symptoms is less consistent with this diagnosis; he appears to have been well prior to Sunday, with no history of insidious low-grade fever, night sweats, weight loss, cough. His risk factors for TB include HIV infection and recent semi-homelessness 2/2 his building being condemned (though he has not been sleeping on the street or in shelters). He has never been incarcerated, denies travel, denies sick contacts.  For now, the patient is still in a negative pressure room. Will reassess given if clinically improved on current antibiotic regimen. AFB smear negative and culture pending. Patient has  been afebrile since 8/15. He is clinicaly improving. Blood  culture no growth so far. Legionella antigen and strep pneumo antigen -ve.  - Continue on Vancomycin 1750 mg IVPB (adjusted by pharmacy as GFR ~10), plus Cefipime 500 mg IVPB qd for coverage of HCAP as patient was hospitalized recently in 05/2011. D/c Azithromycin d/t liver complications at this time. Already completed 5 days of azithromycin, legionella and strep pneumo antigen negative. - Follow up blood and sputum cultures. Still negative. - Legionella and strep pneumo antigen -ve - Albuterol inhaler prn wheezing  - Oxycodone 5-10mg  q6h prn pain  - Zofran prn nausea   #Macrocytic anemia - Hgb 6.8, MCV 108.8 on admission. Original differential diagnoses included acute blood loss (possible sources include sputum, stool, w/ +ve FOBT, urine, or internal hemorrhage as patient with multiple cysts in kidneys and liver, with some noted to have internal hyperdensities on CT 05/19/13 suggesting hemorrhage). Other causes of macrocytic anemia on the differential include drug induced anemia (on zidovidine), liver disease, alcohol abuse (patient claims he has not had alcohol in more than 3 months), folate or B12 deficiency (normal), hemolytic anemia (ruled out). Anemia panel points more towards a mixed pictures at this time including liver disease w/ significant polycystic findings, ACD, and iron deficiency. Iron 14, TIBC 212, and Ferritin 784. Still, bleeding cannot be ruled out. Today, Hb is 6.5. -GI consulted today. Do not recommend EGD or colonoscopy at this time.  - Continue monitoring CBC Repeat in PM.  - Transfused 1 unit PRBC's.  #Thrombocytopenia- Patient was admitted w/ platelets of 104, today they are 56. It is most likely that the patient has poor platelet production as well as poor platelet function 2/2 liver disease and significant azotemia.  - Patient also w/ low Hb today (6.5). Still possibly d/t bleeding. Patient denies any frank GI bleed. FOBT +ve on admission.  #Transaminitis - AST/ALT 141/75,  from 13/7 on 05/19/13. Patient does have a history of alcohol abuse, and his AST/ALT ratio are consistent with this, however he denies any recent alcohol abuse (none w/ in last several months) secondary to nausea. 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 He has physical exam findings suggestive of liver disease including asterixis and hepatomegaly. His bilirubin is not elevated. However, he most likely has reduced synthetic function given thrombocytopenia (93), elevated INR (3.8), and low albumin (2.9) on admission. Today he has AST of  230, ALT of 192, TP of 5.7, albumin of 2.1, and Alk phos of 74. AST/ALT significantly improved from yesterday. Elevation of liver enzymes possibly due to worsening cystic disease?. May also be 2/2 azithromycin use for HCAP. Will d/c Azithromycin. - Hepatitis serologies all negative. - Repeat CMET in AM. - Avoiding medications with tylenol.  #Electrolyte abnormalities- BMP shows hyponatremia (123 today) and metabolic acidosis (AG 23) in this patient with vomiting and ESRD. We are limited in terms of IVF treatment options given ESRD.  - First session of HD yesterday. HD also for today.  #Supratherapeutic INR - 3.8 on admission. Likely 2/2 decreased po intake combined with ?decreased hepatic synthetic function. May have contributed to his possible bleeding issue at this time. INR 2.27 today. - Holding coumadin still at this point. Repeat coags tomorrow in AM.  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 BUN 123. GFR via [place] Gault is 10.61. Still making urine. First session of HD yesterday, patient tolerated well. For another session of HD  today.  #HTN - BP stable. 106/63 this AM - Holding home norvasc   #HIV - Stable. Followed by Dr. Linus Salmons, last CD4 580, viral load <20 in 6/14.  - Continue lamividine (dose decreased as per pharmacy d/t hepatic function), zidovidine, efavirenz  Dispo: Disposition is  deferred at this time, awaiting improvement of current medical problems.  Anticipated discharge in approximately 2 day(s).   The patient does have a current PCP [name], MD) and does need an [facility] hospital follow-up appointment after discharge.  The patient does not have transportation limitations that hinder transportation to clinic appointments.  .Services Needed at time of discharge: Y = Yes, Blank = No PT:   OT:   RN:   Equipment:   Other:     LOS: 4 days   Luanne Bras, MD 08/02/2013, 6:00 AM Pager: [phone]

## 2013-08-02 NOTE — Progress Notes (Signed)
08/02/2013 4:08 PM Hemodialysis Outpatient Note: this patient has been accepted at the [facility] dialysis center on a Mon Wed Fri 2nd shift schedule. Thank you. Gordy Savers

## 2013-08-02 NOTE — Progress Notes (Signed)
Palliative Medicine Team Progress Note  Brian [name] has expressed a desire to have full scope medical interventions and care including inititation of hemodialysis. He will need continued support with this transition and ongoing goals of care. Additionally, he would benefit from outpatient care management [facility]) if he is eligible.  He desires FULL CODE status. No acute symptom management needs. Please re-consult or call PMT for any additional needs.  Lane Hacker, DO Palliative Medicine'
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
## 2013-08-03 ENCOUNTER — Inpatient Hospital Stay (HOSPITAL_COMMUNITY): Payer: Medicaid Other

## 2013-08-03 ENCOUNTER — Encounter: Payer: Self-pay | Admitting: Internal Medicine

## 2013-08-03 DIAGNOSIS — Z7901 Long term (current) use of anticoagulants: Secondary | ICD-10-CM

## 2013-08-03 LAB — CULTURE, RESPIRATORY W GRAM STAIN: Culture: NORMAL

## 2013-08-03 LAB — COMPREHENSIVE METABOLIC PANEL
ALT: 149 U/L — ABNORMAL HIGH (ref 0–53)
CO2: 23 mEq/L (ref 19–32)
Calcium: 7.5 mg/dL — ABNORMAL LOW (ref 8.4–10.5)
Creatinine, Ser: 6.09 mg/dL — ABNORMAL HIGH (ref 0.50–1.35)
GFR calc Af Amer: 12 mL/min — ABNORMAL LOW (ref >90)
GFR calc non Af Amer: 10 mL/min — ABNORMAL LOW (ref >90)
Glucose, Bld: 148 mg/dL — ABNORMAL HIGH (ref 70–99)
Sodium: 131 mEq/L — ABNORMAL LOW (ref 135–145)

## 2013-08-03 LAB — CBC
HCT: 21.5 % — ABNORMAL LOW (ref 39.0–52.0)
Hemoglobin: 7.4 g/dL — ABNORMAL LOW (ref 13.0–17.0)
Hemoglobin: 7.9 g/dL — ABNORMAL LOW (ref 13.0–17.0)
MCH: 36.1 pg — ABNORMAL HIGH (ref 26.0–34.0)
MCV: 100 fL (ref 78.0–100.0)
RBC: 2.05 MIL/uL — ABNORMAL LOW (ref 4.22–5.81)
WBC: 3.5 10*3/uL — ABNORMAL LOW (ref 4.0–10.5)

## 2013-08-03 MED ORDER — POTASSIUM CHLORIDE CRYS ER 20 MEQ PO TBCR
40.0000 meq | EXTENDED_RELEASE_TABLET | Freq: Once | ORAL | Status: AC
  Administered 2013-08-03: 40 meq via ORAL
  Filled 2013-08-03: qty 2

## 2013-08-03 MED ORDER — DARBEPOETIN ALFA-POLYSORBATE 200 MCG/0.4ML IJ SOLN: 200.0000 ug | INTRAMUSCULAR | Status: DC

## 2013-08-03 NOTE — Progress Notes (Signed)
Subjective: [name] is a 37 y.o. man with history of HIV (followed by Dr. 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  Patient seen at bedside this AM. Patient still complaining of hemoptysis this AM. Showed me some blood that he coughed up in the trash can. Doesn't feel much improved today. Denies any dizziness, lightheadedness, or chest pain. No nausea or vomiting, no hematemesis or melena. Will d/c Tb precautions pending Chest CT this evening.  Today, his AVF was bruised. Went without HD today, plan for HD in the AM. Had some increased LE edema today.  Objective: Vital signs in last 24 hours: Filed Vitals:   08/03/13 0545 08/03/13 0803 08/03/13 1051 08/03/13 1844  BP: 103/71 107/71 104/65 113/77  Pulse: 92 98 102 112  Temp: 98.9 F (37.2 C) 99 F (37.2 C) 99.2 F (37.3 C) 100.8 F (38.2 C)  TempSrc: Oral     Resp: 18 18 17 18   Height:      Weight:      SpO2: 97% 93% 93% 92%   Weight change:   Intake/Output Summary (Last 24 hours) at 08/03/13 1933 Last data filed at 08/03/13 1350  Gross per 24 hour  Intake   1080 ml  Output      0 ml  Net   1080 ml   Physical Exam: General: Alert, cooperative, and in no apparent distress HEENT: Vision grossly intact, oropharynx clear and non-erythematous  Neck: Full range of motion without pain, supple, no lymphadenopathy or carotid bruits Lungs: Clear to ascultation bilaterally. Still w/ crackles in both lungs on auscultation.  Heart: Regular rate and rhythm, no murmurs, gallops, or rubs Abdomen: Distended, with tenderness to palpation. Mass palpated in R LQ, most likely part of his PCK. Liver palpated on exam. Bowel sounds present. Extremities: +1 pitting edema in the LE's. No cyanosis or clubbing. Neurologic: Alert & oriented X3, cranial nerves II-XII intact,  strength grossly intact, sensation intact to light touch  Lab Results: Basic Metabolic Panel:  Recent Labs Lab 08/01/13 0500 08/02/13 1056  NA 125* 127*  K 3.8 3.2*  CL 83* 86*  CO2 17* 23  GLUCOSE 151* 127*  BUN 130* 94*  CREATININE 11.75* 8.74*  CALCIUM 8.0* 7.5*  PHOS  --  7.6*   Liver Function Tests:  Recent Labs Lab 08/01/13 0500 08/02/13 1056 08/02/13 1147  AST 430*  --  230*  ALT 258*  --  192*  ALKPHOS 57  --  74  BILITOT 0.4  --  0.4  PROT 5.8*  --  5.7*  ALBUMIN 2.4* 2.1* 2.1*   No results found for this basename: LIPASE, AMYLASE,  in the last 168 hours No results found for this basename: AMMONIA,  in the last 168 hours CBC:  Recent Labs Lab 07/29/13 1418  08/01/13 0500  08/03/13 0905 08/03/13 1321  WBC 6.1  < > 4.1  < > 3.2* 3.5*  NEUTROABS 5.1  --  3.7  --   --   --   HGB 6.8*  < > 7.5*  < > 7.4* 7.9*  HCT 19.8*  < > 20.8*  < > 20.5* 21.5*  MCV 108.8*  < > 101.0*  < > 100.0 98.6  PLT 104*  < > 99*  < > 82* 91*  < > = values in  this interval not displayed. Coagulation:  Recent Labs Lab 07/29/13 1429 07/31/13 1632 08/01/13 0830 08/02/13 1056  LABPROT  --  42.7* 38.1* 24.3*  INR 3.8 4.75* 4.09* 2.27*   Anemia Panel:  Recent Labs Lab 07/29/13 2310  VITAMINB12 482  FOLATE 16.0  FERRITIN 784*  TIBC 212*  IRON 14*  RETICCTPCT 0.8   Urine Drug Screen: Drugs of Abuse     Component Value Date/Time   LABOPIA NEG 03/26/2013 1519   LABOPIA POSITIVE* 09/22/2010 1140   COCAINSCRNUR NEG 03/26/2013 1519   COCAINSCRNUR NONE DETECTED 09/22/2010 1140   LABBENZ NEG 03/26/2013 1519   LABBENZ NEG 07/30/2011 1656   LABBENZ POSITIVE* 09/22/2010 1140   AMPHETMU NEG 07/30/2011 1656   AMPHETMU NONE DETECTED 09/22/2010 1140   THCU NEG 03/26/2013 1519   THCU NONE DETECTED 09/22/2010 1140   LABBARB NEG 03/26/2013 1519   LABBARB  Value: NONE DETECTED        DRUG SCREEN FOR MEDICAL PURPOSES ONLY.  IF CONFIRMATION IS NEEDED FOR ANY PURPOSE, NOTIFY LAB WITHIN 5  DAYS.        LOWEST DETECTABLE LIMITS FOR URINE DRUG SCREEN Drug Class       Cutoff (ng/mL) Amphetamine      1000 Barbiturate      200 Benzodiazepine   [id number] Tricyclics       [id number] Opiates          300 Cocaine          300 THC              50 09/22/2010 1140    Micro Results: Recent Results (from the past 240 hour(s))  CULTURE, BLOOD (SINGLE)     Status: None   Collection Time    07/29/13  2:18 PM      Result Value Range Status   Preliminary Report Blood Culture received; No Growth to date;   Preliminary   Preliminary Report Culture will be held for 5 days before issuing   Preliminary   Preliminary Report a Final Negative report.   Preliminary  MRSA PCR SCREENING     Status: None   Collection Time    07/29/13  6:15 PM      Result Value Range Status   MRSA by PCR NEGATIVE  NEGATIVE Final   Comment:            The GeneXpert MRSA Assay (FDA     approved for NASAL specimens     only), is one component of a     comprehensive MRSA colonization     surveillance program. It is not     intended to diagnose MRSA     infection nor to guide or     monitor treatment for     MRSA infections.  CULTURE, BLOOD (ROUTINE X 2)     Status: None   Collection Time    07/29/13 11:10 PM      Result Value Range Status   Specimen Description BLOOD LEFT ARM   Final   Special Requests BOTTLES DRAWN AEROBIC AND ANAEROBIC 10CC   Final   Culture  Setup Time     Final   Value: 07/30/2013 06:02     Performed at [facility]   Culture     Final   Value:        BLOOD CULTURE RECEIVED NO GROWTH TO DATE CULTURE WILL BE HELD FOR 5 DAYS BEFORE ISSUING A FINAL NEGATIVE REPORT     Performed at  [facility]   Report Status PENDING   Incomplete  CULTURE, BLOOD (ROUTINE X 2)     Status: None   Collection Time    07/29/13 11:16 PM      Result Value Range Status   Specimen Description BLOOD LEFT HAND   Final   Special Requests BOTTLES DRAWN AEROBIC AND ANAEROBIC 5CC   Final   Culture  Setup Time     Final     Value: 07/30/2013 06:02     Performed at [facility]   Culture     Final   Value:        BLOOD CULTURE RECEIVED NO GROWTH TO DATE CULTURE WILL BE HELD FOR 5 DAYS BEFORE ISSUING A FINAL NEGATIVE REPORT     Performed at [facility]   Report Status PENDING   Incomplete  CULTURE, EXPECTORATED SPUTUM-ASSESSMENT     Status: None   Collection Time    07/30/13 12:45 AM      Result Value Range Status   Specimen Description SPUTUM   Final   Special Requests NONE   Final   Sputum evaluation     Final   Value: MICROSCOPIC FINDINGS SUGGEST THAT THIS SPECIMEN IS NOT REPRESENTATIVE OF LOWER RESPIRATORY SECRETIONS. PLEASE RECOLLECT.     CALLED TO MOORE,K RN 07/30/2013 0347 JORDANS   Report Status 07/30/2013 FINAL   Final  CULTURE, EXPECTORATED SPUTUM-ASSESSMENT     Status: None   Collection Time    07/30/13  3:08 PM      Result Value Range Status   Specimen Description SPUTUM   Final   Special Requests NONE   Final   Sputum evaluation     Final   Value: MICROSCOPIC FINDINGS SUGGEST THAT THIS SPECIMEN IS NOT REPRESENTATIVE OF LOWER RESPIRATORY SECRETIONS. PLEASE RECOLLECT.     NOTIFIED A TONEY 1605 07/30/13 A BROWNING   Report Status 07/30/2013 FINAL   Final  AFB CULTURE WITH SMEAR     Status: None   Collection Time    07/30/13  3:08 PM      Result Value Range Status   Specimen Description SPUTUM   Final   Special Requests NONE   Final   ACID FAST SMEAR     Final   Value: NO ACID FAST BACILLI SEEN     Performed at [facility]   Culture     Final   Value: CULTURE WILL BE EXAMINED FOR 6 WEEKS BEFORE ISSUING A FINAL REPORT     Performed at [facility]   Report Status PENDING   Incomplete  CULTURE, EXPECTORATED SPUTUM-ASSESSMENT     Status: None   Collection Time    07/30/13  8:41 PM      Result Value Range Status   Specimen Description SPUTUM   Final   Special Requests NONE   Final   Sputum evaluation     Final   Value: MICROSCOPIC FINDINGS SUGGEST THAT  THIS SPECIMEN IS NOT REPRESENTATIVE OF LOWER RESPIRATORY SECRETIONS. PLEASE RECOLLECT.     Osborne Casco RN [id number] 07/30/13 A BROWNING   Report Status 07/30/2013 FINAL   Final  AFB CULTURE WITH SMEAR     Status: None   Collection Time    07/30/13  8:41 PM      Result Value Range Status   Specimen Description SPUTUM   Final   Special Requests Immunocompromised   Final   ACID FAST SMEAR     Final  Value: NO ACID FAST BACILLI SEEN     Performed at [facility]   Culture     Final   Value: CULTURE WILL BE EXAMINED FOR 6 WEEKS BEFORE ISSUING A FINAL REPORT     Performed at [facility]   Report Status PENDING   Incomplete  AFB CULTURE WITH SMEAR     Status: None   Collection Time    07/31/13  2:44 PM      Result Value Range Status   Specimen Description SPU   Final   Special Requests Immunocompromised   Final   ACID FAST SMEAR     Final   Value: NO ACID FAST BACILLI SEEN     Performed at [facility]   Culture     Final   Value: CULTURE WILL BE EXAMINED FOR 6 WEEKS BEFORE ISSUING A FINAL REPORT     Performed at [facility]   Report Status PENDING   Incomplete  CULTURE, RESPIRATORY (NON-EXPECTORATED)     Status: None   Collection Time    07/31/13  2:44 PM      Result Value Range Status   Specimen Description SPUTUM   Final   Special Requests NONE   Final   Gram Stain     Final   Value: RARE WBC PRESENT, PREDOMINANTLY PMN     FEW SQUAMOUS EPITHELIAL CELLS PRESENT     FEW GRAM POSITIVE COCCI IN CLUSTERS     FEW GRAM NEGATIVE RODS     Performed at [facility]   Culture     Final   Value: NORMAL OROPHARYNGEAL FLORA     Performed at [facility]   Report Status 08/03/2013 FINAL   Final   Studies/Results: No results found. Medications: I have reviewed the patient's current medications. Scheduled Meds: . calcium acetate  1,334 mg Oral TID WC  . ceFEPime (MAXIPIME) IV  500 mg Intravenous Q24H  . [START ON 08/09/2013] darbepoetin  (ARANESP) injection - DIALYSIS  200 mcg Intravenous Q Mon-HD  . efavirenz  600 mg Oral QHS  . ferric gluconate (FERRLECIT/NULECIT) IV  125 mg Intravenous Daily  . lamiVUDine  50 mg Oral Daily  . multivitamin  1 tablet Oral Daily  . nicotine  14 mg Transdermal QPC supper  . polyethylene glycol  17 g Oral Daily  . zidovudine  300 mg Oral Daily   Continuous Infusions:   PRN Meds:.sodium chloride, sodium chloride, albuterol, feeding supplement (NEPRO CARB STEADY), heparin, ibuprofen, lidocaine (PF), lidocaine-prilocaine, ondansetron (ZOFRAN) IV, ondansetron, oxyCODONE, pentafluoroprop-tetrafluoroeth Assessment/Plan:  #CAP vs. HCAP - History of acute development of productive cough with CXR showing lobar consolidation are most suggestive of CAP, however, the patient was recently hospitalized in 05/2013, therefre this must be treated as HCAP. No WBC elevation, but a left shift was present on admission (85%). Borderline fevers noted (100.3-101.3 F) intially. Last fever was low grade at 100, yesterday (07/31/13) around 5:00 PM.  He endorses hemoptysis at home, however he describes the "chunks of blood" in his sputum as more brown than bright red. For now, the patient is still in a negative pressure room. Will reassess in AM if clinically improved. AFB smear negative x3. Will d/c negative pressure isolation pending chest CT shows no obvious cavitary lesions. Patient has been afebrile since 8/15. Blood culture no growth so far. Legionella antigen and strep pneumo antigen -ve.  - Patient for Chest CT this evening to assess for pulmonary cavitation and extent  of pulmonary issues. - Continue on Vancomycin 1750 mg IVPB (adjusted by pharmacy as GFR ~10), plus Cefipime 500 mg IVPB qd for coverage of HCAP as patient was hospitalized recently in 05/2011. Azithromycin d/c'ed yesterday d/t liver complications at this time.  - Follow up blood and sputum cultures. Still negative. - Albuterol inhaler prn wheezing  -  Oxycodone 5-10mg  q6h prn pain  - Zofran prn nausea   #Macrocytic anemia - Hgb 6.8, MCV 108.8 on admission. Original differential diagnoses included acute blood loss (possible sources include sputum, stool, w/ +ve FOBT, urine, or internal hemorrhage as patient with multiple cysts in kidneys and liver, with some noted to have internal hyperdensities on CT 05/19/13 suggesting hemorrhage). Other causes of macrocytic anemia on the differential include drug induced anemia (on zidovidine), liver disease, alcohol abuse (patient claims he has not had alcohol in more than 3 months), folate or B12 deficiency (normal), hemolytic anemia (ruled out). Anemia panel points more towards a mixed pictures at this time including liver disease w/ significant polycystic findings, ACD, and iron deficiency. Iron 14, TIBC 212, and Ferritin 784. Still, bleeding cannot be ruled out. Today, Hb is 7.1.  - For CT abdomen/pelvis to r/o intraperitoneal bleed 2/2 hepatic cyst rupture. Patient still reports significant abdominal tenderness to palpation. - Continue monitoring CBC Repeat in PM.  - Transfuse if <7.0 in AM  #Thrombocytopenia- Patient was admitted w/ platelets of 104, today they are 91. It is most likely that the patient has poor platelet production as well as poor platelet function 2/2 liver disease and significant azotemia.  - Patient denies any frank GI bleed. FOBT +ve on admission. Still w/ hemoptysis.  #Transaminitis - AST/ALT 141/75, from 13/7 on 05/19/13. Patient does have a history of alcohol abuse, and his AST/ALT ratio are consistent with this, however he denies any recent alcohol abuse (none w/ in last several months) secondary to nausea. 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 He has physical exam findings suggestive of liver disease including asterixis and hepatomegaly. His bilirubin is not elevated. However, he most likely has reduced synthetic function given  thrombocytopenia (93), elevated INR (3.8), and low albumin (2.9) on admission. Today, AST of  163, ALT of 149, TP of 5.8, albumin of 2.0, and Alk phos of 86. AST/ALT significantly improved from day previous. Elevation of liver enzymes possibly due to worsening cystic disease?. May also be 2/2 azithromycin use for HCAP. D/c'ed Azithromycin. - Hepatitis serologies all negative. - Repeat CMET in AM. - Avoiding medications with tylenol.  #Electrolyte abnormalities- BMP improved since starting HD. We are limited in terms of IVF treatment options given ESRD. First session of HD 08/01/13. HD also for tomorrow. K+ low, repleted w/ 40 mEq K-dur.  #Supratherapeutic INR - 3.8 on admission. Likely 2/2 decreased po intake combined with ?decreased hepatic synthetic function. May have contributed to his possible bleeding issue at this time. INR 1.52 today, much improved. - Holding coumadin still at this point. Repeat coags tomorrow in AM.  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 BUN 66. Still making urine. First session of HD 08/01/13, patient tolerated well. For another session of HD tomorrow. Patient has worsening LE edema. Placed TED hose this evening to see if improved in AM.  #HTN - BP stable. 113/77 this PM. - Holding home norvasc   #HIV - Stable. Followed by Dr. Linus [name], last CD4 580, viral load <20 in 6/14.  - Continue  lamividine (dose decreased as per pharmacy d/t hepatic function), zidovidine, efavirenz  Dispo: Disposition is deferred at this time, awaiting improvement of current medical problems.  Anticipated discharge in approximately 2 day(s).   The patient does have a current PCP [name], MD) and does need an [facility] hospital follow-up appointment after discharge.  The patient does not have transportation limitations that hinder transportation to clinic appointments.  .Services Needed at time of discharge: Y = Yes, Blank = No PT:   OT:   RN:   Equipment:   Other:     LOS:  5 days   Luanne Bras, MD 08/03/2013, 7:33 PM Pager: [phone]

## 2013-08-03 NOTE — Progress Notes (Signed)
Chaplain visited pt in response to spiritual care consult. Pt was awake, alert and responsive. Pt was behind close doors due to respiratory contact precautions. Pt shared with chaplain he is up and down but feeling better today. Chaplain shared words of comfort and encourage pt. Chaplain provided ministry of empathic listening, prayed with pt and assured him of spiritual care as needed. Pt thanked chaplain for the visit and prayer. Jarrett Ables [id number]   08/03/13 1430  Clinical Encounter Type  Visited With Patient  Visit Type Initial;Spiritual support  Referral From Nurse  Consult/Referral To Chaplain  Spiritual Encounters  Spiritual Needs Prayer;Emotional  Stress Factors  Patient Stress Factors Other (Comment) (Emotional distress)

## 2013-08-03 NOTE — Progress Notes (Signed)
Internal Medicine Attending  Date: 08/03/2013  Patient name: Brian [name] Medical record number: [id number] Date of birth: 1976/05/23 Age: 37 y.o. Gender: male  I saw and evaluated the patient, and discussed his care on a.m. rounds with house staff. I reviewed the resident's note by Dr. Ronnald Ramp and I agree with the resident's findings and plans as documented in his note.

## 2013-08-03 NOTE — Progress Notes (Signed)
Subjective:   Feeling better, still have cough but is improving, not coughing up blood all the time. Dialysis went  well yesterday again.  able to eat well. No complaints.  AVF is a little bruised- does have spot MWF at [facility]:   08/02/13 1842 08/02/13 2038 08/03/13 0545 08/03/13 0803  BP: 124/77 112/74 103/71 107/71  Pulse: 109 90 92 98  Temp: 98.2 F (36.8 C)  98.9 F (37.2 C) 99 F (37.2 C)  TempSrc:  Oral Oral   Resp: 18 18 18 18   Height:  6\' 4"  (1.93 m)    Weight:  90.501 kg (199 lb 8.3 oz)    SpO2: 99% 99% 97% 93%   Physical Exam General: Alert and cooperative. No acute distress. Heart: RRR, no murmur Lungs: Clear with nonproductive cough (previously productive per pt) Abdomen: distended, generalized tenderness to palpation. + BS x4 Extremities: + 1 LE edema bilat Dialysis Access: RUA AVF +bruit and thrill- slightly bruised  Assessment/Plan: 1. Cough/Fever- afebrile. On antibiotics- maxipime and vanc per primary team. Blood cultures no growth to date. AFB smear negative times several- can pt be taken out of respiratory isolation ???. Sputum cultures pending. Chest xray showed airspace consolidation in the superior segment right lower lobe and possibly posterior segment right upper lobe. The findings are most consistent with pneumonia. Remains in negative pressure room. TB skin test pending. 2. ESRD - first HD yesterday (8/17), 1 L off.  Second 8/18 again one liter off secondary to soft BP- tolerated well. Was going to do third today but since is going to be on a MWF schedule and his AVF is bruised and I am hoping he will be out of isolation, will plan on third treatment tomorrow (Wed).  alsoo of note, pt had been sent to [facility] for possibility of transplant but financially he is not able to swing it so has taken himself out of consideration for now.  3. Anemia -  hgb low s/p transfusion 8/14. 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  Secondary hyperparathyroidism -  Ca+ 7.5, last phos 7.6 .  Have added phoslo  Last PTH was 262, no vitamin D as of yet 5. HTN/volume -  106/72 wt 20.5 kg, up 2 kgs since admission. He is edematous but maybe that is more from low albumin- UF is tough due to soft BP but will continue to challenge as able.  6. Nutrition - alb 2.4 on regular diet. dialysis should help 7. HIV- continue current therapy 8. Dispo- pt now has OP spot, could be discharged from our standpoint as early as tomorrow after HD 9. Coumadin is therapeutic   Jasdeep Kepner A   08/03/2013,9:36 AM  LOS: 5 days       Additional Objective Labs: Basic Metabolic Panel:  Recent Labs Lab 07/31/13 0545 08/01/13 0500 08/02/13 1056  NA 123* 125* 127*  K 4.9 3.8 3.2*  CL 86* 83* 86*  CO2 14* 17* 23  GLUCOSE 132* 151* 127*  BUN 123* 130* 94*  CREATININE 11.71* 11.75* 8.74*  CALCIUM 7.9* 8.0* 7.5*  PHOS  --   --  7.6*   Liver Function Tests:  Recent Labs Lab 07/31/13 0545 08/01/13 0500 08/02/13 1056 08/02/13 1147  AST 301* 430*  --  230*  ALT 170* 258*  --  192*  ALKPHOS 38* 57  --  74  BILITOT 0.4 0.4  --  0.4  PROT 5.5* 5.8*  --  5.7*  ALBUMIN  2.4* 2.4* 2.1* 2.1*   No results found for this basename: LIPASE, AMYLASE,  in the last 168 hours CBC:  Recent Labs Lab 07/29/13 1418  07/31/13 0545 07/31/13 1632 08/01/13 0500 08/02/13 1056 08/02/13 2235  WBC 6.1  < > 4.6 6.0 4.1 3.1* 2.5*  NEUTROABS 5.1  --   --   --  3.7  --   --   HGB 6.8*  < > 7.2* 7.3* 7.5* 6.5* 7.1*  HCT 19.8*  < > 20.3* 20.0* 20.8* 17.9* 19.4*  MCV 108.8*  < > 103.0* 101.0* 101.0* 101.1* 99.5  PLT 104*  < > 93* 108* 99* 84* 82*  < > = values in this interval not displayed. Blood Culture    Component Value Date/Time   SDES SPU 07/31/2013 1444   SDES SPUTUM 07/31/2013 1444   SPECREQUEST Immunocompromised 07/31/2013 1444   SPECREQUEST NONE 07/31/2013 1444   CULT  Value: CULTURE WILL BE EXAMINED FOR 6 WEEKS BEFORE ISSUING A  FINAL REPORT Performed at [facility] 07/31/2013 1444   CULT  Value: NORMAL OROPHARYNGEAL FLORA Performed at [place] 07/31/2013 1444   REPTSTATUS PENDING 07/31/2013 1444   REPTSTATUS PENDING 07/31/2013 1444    Cardiac Enzymes: No results found for this basename: CKTOTAL, CKMB, CKMBINDEX, TROPONINI,  in the last 168 hours CBG: No results found for this basename: GLUCAP,  in the last 168 hours Iron Studies: No results found for this basename: IRON, TIBC, TRANSFERRIN, FERRITIN,  in the last 72 hours @lablastinr3 @ Studies/Results: No results found. Medications:   . calcium acetate  1,334 mg Oral TID WC  . ceFEPime (MAXIPIME) IV  500 mg Intravenous Q24H  . efavirenz  600 mg Oral QHS  . ferric gluconate (FERRLECIT/NULECIT) IV  125 mg Intravenous Daily  . lamiVUDine  50 mg Oral Daily  . multivitamin  1 tablet Oral Daily  . nicotine  14 mg Transdermal QPC supper  . polyethylene glycol  17 g Oral Daily  . zidovudine  300 mg Oral Daily

## 2013-08-04 DIAGNOSIS — R042 Hemoptysis: Secondary | ICD-10-CM | POA: Diagnosis present

## 2013-08-04 DIAGNOSIS — J189 Pneumonia, unspecified organism: Secondary | ICD-10-CM

## 2013-08-04 DIAGNOSIS — D72819 Decreased white blood cell count, unspecified: Secondary | ICD-10-CM

## 2013-08-04 DIAGNOSIS — D72810 Lymphocytopenia: Secondary | ICD-10-CM

## 2013-08-04 DIAGNOSIS — Q613 Polycystic kidney, unspecified: Secondary | ICD-10-CM

## 2013-08-04 DIAGNOSIS — N189 Chronic kidney disease, unspecified: Secondary | ICD-10-CM

## 2013-08-04 DIAGNOSIS — Z21 Asymptomatic human immunodeficiency virus [HIV] infection status: Secondary | ICD-10-CM

## 2013-08-04 LAB — RENAL FUNCTION PANEL
Albumin: 2 g/dL — ABNORMAL LOW (ref 3.5–5.2)
Chloride: 90 mEq/L — ABNORMAL LOW (ref 96–112)
GFR calc Af Amer: 11 mL/min — ABNORMAL LOW (ref >90)
GFR calc non Af Amer: 10 mL/min — ABNORMAL LOW (ref >90)
Phosphorus: 5.3 mg/dL — ABNORMAL HIGH (ref 2.3–4.6)
Potassium: 3.7 mEq/L (ref 3.5–5.1)
Sodium: 127 mEq/L — ABNORMAL LOW (ref 135–145)

## 2013-08-04 LAB — QUANTIFERON TB GOLD ASSAY (BLOOD)
Mitogen value: 3.09 IU/mL
Quantiferon Nil Value: 2.22 IU/mL
TB Ag value: 2.23 IU/mL

## 2013-08-04 LAB — CBC WITH DIFFERENTIAL/PLATELET
Basophils Absolute: 0 10*3/uL (ref 0.0–0.1)
HCT: 18.3 % — ABNORMAL LOW (ref 39.0–52.0)
Lymphocytes Relative: 17 % (ref 12–46)
Monocytes Relative: 4 % (ref 3–12)
Neutro Abs: 2.3 10*3/uL (ref 1.7–7.7)
Neutrophils Relative %: 79 % — ABNORMAL HIGH (ref 43–77)
Platelets: 79 10*3/uL — ABNORMAL LOW (ref 150–400)
RDW: 17.6 % — ABNORMAL HIGH (ref 11.5–15.5)
Smear Review: DECREASED
WBC: 2.9 10*3/uL — ABNORMAL LOW (ref 4.0–10.5)

## 2013-08-04 LAB — VANCOMYCIN, RANDOM: Vancomycin Rm: 11.8 ug/mL

## 2013-08-04 LAB — PREPARE RBC (CROSSMATCH)

## 2013-08-04 LAB — PROTIME-INR: Prothrombin Time: 16.2 seconds — ABNORMAL HIGH (ref 11.6–15.2)

## 2013-08-04 MED ORDER — SODIUM CHLORIDE 0.9 % IV SOLN: 100.0000 mL | INTRAVENOUS | Status: DC | PRN

## 2013-08-04 MED ORDER — DARBEPOETIN ALFA-POLYSORBATE 100 MCG/0.5ML IJ SOLN
100.0000 ug | Freq: Once | INTRAMUSCULAR | Status: AC
  Administered 2013-08-04: 100 ug via INTRAVENOUS
  Filled 2013-08-04: qty 0.5

## 2013-08-04 MED ORDER — NEPRO/CARBSTEADY PO LIQD: 237.0000 mL | ORAL | Status: DC | PRN

## 2013-08-04 MED ORDER — HEPARIN SODIUM (PORCINE) 1000 UNIT/ML DIALYSIS: 20.0000 [IU]/kg | INTRAMUSCULAR | Status: DC | PRN

## 2013-08-04 MED ORDER — PENTAFLUOROPROP-TETRAFLUOROETH EX AERO: 1.0000 "application " | INHALATION_SPRAY | CUTANEOUS | Status: DC | PRN

## 2013-08-04 MED ORDER — ABACAVIR SULFATE 300 MG PO TABS
600.0000 mg | ORAL_TABLET | Freq: Every day | ORAL | Status: DC
  Administered 2013-08-04 – 2013-08-06 (×3): 600 mg via ORAL
  Filled 2013-08-04 (×4): qty 2

## 2013-08-04 MED ORDER — HEPARIN SODIUM (PORCINE) 1000 UNIT/ML DIALYSIS
1000.0000 [IU] | INTRAMUSCULAR | Status: DC | PRN
  Filled 2013-08-04: qty 1

## 2013-08-04 MED ORDER — VANCOMYCIN HCL 10 G IV SOLR
1250.0000 mg | Freq: Once | INTRAVENOUS | Status: AC
  Administered 2013-08-04: 1250 mg via INTRAVENOUS
  Filled 2013-08-04: qty 1250

## 2013-08-04 MED ORDER — LIDOCAINE-PRILOCAINE 2.5-2.5 % EX CREA: 1.0000 "application " | TOPICAL_CREAM | CUTANEOUS | Status: DC | PRN

## 2013-08-04 MED ORDER — ALTEPLASE 2 MG IJ SOLR
2.0000 mg | Freq: Once | INTRAMUSCULAR | Status: AC | PRN
  Filled 2013-08-04: qty 2

## 2013-08-04 MED ORDER — HEPARIN SODIUM (PORCINE) 1000 UNIT/ML DIALYSIS
20.0000 [IU]/kg | INTRAMUSCULAR | Status: DC | PRN
  Filled 2013-08-04: qty 2

## 2013-08-04 MED ORDER — LIDOCAINE HCL (PF) 1 % IJ SOLN: 5.0000 mL | INTRAMUSCULAR | Status: DC | PRN

## 2013-08-04 NOTE — Progress Notes (Signed)
Internal Medicine Attending  Date: 08/04/2013  Patient name: Brian [name] Medical record number: [id number] Date of birth: May 17, 1976 Age: 37 y.o. Gender: male  I saw and evaluated the patient. I reviewed the resident's note by Dr. Ronnald Ramp and I agree with the resident's findings and plans as documented in his note.

## 2013-08-04 NOTE — Progress Notes (Signed)
Subjective: [name] is a 37 y.o. man with history of HIV (followed by Dr. 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  Patient seen at bedside this AM. Feels slightly better today, but still w/ cough productive of blood-tinged sputum. Denies any subjective fever or chills, but did have a mild fever overnight of 100.2. Still w/ some SOB. Denies nausea, vomiting, diarrhea, or constipation. HD today. This AM, Hb was 6.6, given a unit of PRBC's w/ HD.   Chest CT yesterday showed diffuse geographic ground-glass attenuation in the lungs with areas of sparing.  The differential considerations include infection including atypical agents in this patient with immunosuppression and asymmetric/atypical pulmonary edema.  Abdominal XR showed very little change w/ previous CT. No evidence of intra-abdominal bleeding.   Objective: Vital signs in last 24 hours: Filed Vitals:   08/04/13 1045 08/04/13 1100 08/04/13 1130 08/04/13 1148  BP: 116/80 116/83 110/78 113/80  Pulse: 110 105 105 106  Temp: 98.8 F (37.1 C)   98.2 F (36.8 C)  TempSrc: Oral   Oral  Resp: 20 18 18 16   Height:      Weight:      SpO2:  93% 89% 92%   Weight change: 0 lb (0 kg)  Intake/Output Summary (Last 24 hours) at 08/04/13 1511 Last data filed at 08/04/13 1148  Gross per 24 hour  Intake   1470 ml  Output   1893 ml  Net   -423 ml   Physical Exam: General: Alert, cooperative, and in no apparent distress HEENT: Vision grossly intact, oropharynx clear and non-erythematous  Neck: Full range of motion without pain, supple, no lymphadenopathy or carotid bruits Lungs: Clear to ascultation bilaterally. Still w/ crackles in both lungs on auscultation.  Heart: Regular rate and rhythm, no murmurs, gallops, or rubs Abdomen: Distended, with tenderness to  palpation. Mass palpated in R LQ, most likely part of his PCK. Liver palpated on exam. Bowel sounds present. Extremities: +1 pitting edema in the LE's. Slightly better than yesterday. No cyanosis or clubbing. Neurologic: Alert & oriented X3, cranial nerves II-XII intact, strength grossly intact, sensation intact to light touch.  Lab Results: Basic Metabolic Panel:  Recent Labs Lab 08/02/13 1056 08/03/13 1914 08/04/13 0828  NA 127* 131* 127*  K 3.2* 3.4* 3.7  CL 86* 91* 90*  CO2 23 23 19   GLUCOSE 127* 148* 110*  BUN 94* 66* 64*  CREATININE 8.74* 6.09* 6.49*  CALCIUM 7.5* 7.5* 7.7*  PHOS 7.6*  --  5.3*   Liver Function Tests:  Recent Labs Lab 08/02/13 1147 08/03/13 1914 08/04/13 0828  AST 230* 163*  --   ALT 192* 149*  --   ALKPHOS 74 86  --   BILITOT 0.4 0.3  --   PROT 5.7* 5.8*  --   ALBUMIN 2.1* 2.0* 2.0*   No results found for this basename: LIPASE, AMYLASE,  in the last 168 hours No results found for this basename: AMMONIA,  in the last 168 hours CBC:  Recent Labs Lab 08/01/13 0500  08/03/13 1321 08/04/13 0826  WBC 4.1  < > 3.5* 2.9*  NEUTROABS 3.7  --   --  2.3  HGB 7.5*  < > 7.9* 6.6*  HCT 20.8*  < > 21.5* 18.3*  MCV 101.0*  < > 98.6 99.5  PLT  99*  < > 91* 79*  < > = values in this interval not displayed. Coagulation:  Recent Labs Lab 08/01/13 0830 08/02/13 1056 08/03/13 1914 08/04/13 0800  LABPROT 38.1* 24.3* 17.9* 16.2*  INR 4.09* 2.27* 1.52* 1.33   Anemia Panel:  Recent Labs Lab 07/29/13 2310  VITAMINB12 482  FOLATE 16.0  FERRITIN 784*  TIBC 212*  IRON 14*  RETICCTPCT 0.8   Urine Drug Screen: Drugs of Abuse     Component Value Date/Time   LABOPIA NEG 03/26/2013 1519   LABOPIA POSITIVE* 09/22/2010 1140   COCAINSCRNUR NEG 03/26/2013 1519   COCAINSCRNUR NONE DETECTED 09/22/2010 1140   LABBENZ NEG 03/26/2013 1519   LABBENZ NEG 07/30/2011 1656   LABBENZ POSITIVE* 09/22/2010 1140   AMPHETMU NEG 07/30/2011 1656   AMPHETMU NONE DETECTED  09/22/2010 1140   THCU NEG 03/26/2013 1519   THCU NONE DETECTED 09/22/2010 1140   LABBARB NEG 03/26/2013 1519   LABBARB  Value: NONE DETECTED        DRUG SCREEN FOR MEDICAL PURPOSES ONLY.  IF CONFIRMATION IS NEEDED FOR ANY PURPOSE, NOTIFY LAB WITHIN 5 DAYS.        LOWEST DETECTABLE LIMITS FOR URINE DRUG SCREEN Drug Class       Cutoff (ng/mL) Amphetamine      1000 Barbiturate      200 Benzodiazepine   [id number] Tricyclics       [id number] Opiates          300 Cocaine          300 THC              50 09/22/2010 1140    Micro Results: Recent Results (from the past 240 hour(s))  CULTURE, BLOOD (SINGLE)     Status: None   Collection Time    07/29/13  2:18 PM      Result Value Range Status   Organism ID, Bacteria NO GROWTH 5 DAYS   Final  MRSA PCR SCREENING     Status: None   Collection Time    07/29/13  6:15 PM      Result Value Range Status   MRSA by PCR NEGATIVE  NEGATIVE Final   Comment:            The GeneXpert MRSA Assay (FDA     approved for NASAL specimens     only), is one component of a     comprehensive MRSA colonization     surveillance program. It is not     intended to diagnose MRSA     infection nor to guide or     monitor treatment for     MRSA infections.  CULTURE, BLOOD (ROUTINE X 2)     Status: None   Collection Time    07/29/13 11:10 PM      Result Value Range Status   Specimen Description BLOOD LEFT ARM   Final   Special Requests BOTTLES DRAWN AEROBIC AND ANAEROBIC 10CC   Final   Culture  Setup Time     Final   Value: 07/30/2013 06:02     Performed at [facility]   Culture     Final   Value:        BLOOD CULTURE RECEIVED NO GROWTH TO DATE CULTURE WILL BE HELD FOR 5 DAYS BEFORE ISSUING A FINAL NEGATIVE REPORT     Performed at [facility]   Report Status PENDING   Incomplete  CULTURE, BLOOD (ROUTINE X 2)  Status: None   Collection Time    07/29/13 11:16 PM      Result Value Range Status   Specimen Description BLOOD LEFT HAND   Final   Special Requests  BOTTLES DRAWN AEROBIC AND ANAEROBIC 5CC   Final   Culture  Setup Time     Final   Value: 07/30/2013 06:02     Performed at [facility]   Culture     Final   Value:        BLOOD CULTURE RECEIVED NO GROWTH TO DATE CULTURE WILL BE HELD FOR 5 DAYS BEFORE ISSUING A FINAL NEGATIVE REPORT     Performed at [facility]   Report Status PENDING   Incomplete  CULTURE, EXPECTORATED SPUTUM-ASSESSMENT     Status: None   Collection Time    07/30/13 12:45 AM      Result Value Range Status   Specimen Description SPUTUM   Final   Special Requests NONE   Final   Sputum evaluation     Final   Value: MICROSCOPIC FINDINGS SUGGEST THAT THIS SPECIMEN IS NOT REPRESENTATIVE OF LOWER RESPIRATORY SECRETIONS. PLEASE RECOLLECT.     CALLED TO MOORE,K RN 07/30/2013 0347 JORDANS   Report Status 07/30/2013 FINAL   Final  CULTURE, EXPECTORATED SPUTUM-ASSESSMENT     Status: None   Collection Time    07/30/13  3:08 PM      Result Value Range Status   Specimen Description SPUTUM   Final   Special Requests NONE   Final   Sputum evaluation     Final   Value: MICROSCOPIC FINDINGS SUGGEST THAT THIS SPECIMEN IS NOT REPRESENTATIVE OF LOWER RESPIRATORY SECRETIONS. PLEASE RECOLLECT.     NOTIFIED A TONEY 1605 07/30/13 A BROWNING   Report Status 07/30/2013 FINAL   Final  AFB CULTURE WITH SMEAR     Status: None   Collection Time    07/30/13  3:08 PM      Result Value Range Status   Specimen Description SPUTUM   Final   Special Requests NONE   Final   ACID FAST SMEAR     Final   Value: NO ACID FAST BACILLI SEEN     Performed at [facility]   Culture     Final   Value: CULTURE WILL BE EXAMINED FOR 6 WEEKS BEFORE ISSUING A FINAL REPORT     Performed at [facility]   Report Status PENDING   Incomplete  CULTURE, EXPECTORATED SPUTUM-ASSESSMENT     Status: None   Collection Time    07/30/13  8:41 PM      Result Value Range Status   Specimen Description SPUTUM   Final   Special Requests NONE    Final   Sputum evaluation     Final   Value: MICROSCOPIC FINDINGS SUGGEST THAT THIS SPECIMEN IS NOT REPRESENTATIVE OF LOWER RESPIRATORY SECRETIONS. PLEASE RECOLLECT.     Osborne Casco RN [id number] 07/30/13 A BROWNING   Report Status 07/30/2013 FINAL   Final  AFB CULTURE WITH SMEAR     Status: None   Collection Time    07/30/13  8:41 PM      Result Value Range Status   Specimen Description SPUTUM   Final   Special Requests Immunocompromised   Final   ACID FAST SMEAR     Final   Value: NO ACID FAST BACILLI SEEN     Performed at [facility]  Final   Value: CULTURE WILL BE EXAMINED FOR 6 WEEKS BEFORE ISSUING A FINAL REPORT     Performed at [facility]   Report Status PENDING   Incomplete  AFB CULTURE WITH SMEAR     Status: None   Collection Time    07/31/13  2:44 PM      Result Value Range Status   Specimen Description SPU   Final   Special Requests Immunocompromised   Final   ACID FAST SMEAR     Final   Value: NO ACID FAST BACILLI SEEN     Performed at [facility]   Culture     Final   Value: CULTURE WILL BE EXAMINED FOR 6 WEEKS BEFORE ISSUING A FINAL REPORT     Performed at [facility]   Report Status PENDING   Incomplete  CULTURE, RESPIRATORY (NON-EXPECTORATED)     Status: None   Collection Time    07/31/13  2:44 PM      Result Value Range Status   Specimen Description SPUTUM   Final   Special Requests NONE   Final   Gram Stain     Final   Value: RARE WBC PRESENT, PREDOMINANTLY PMN     FEW SQUAMOUS EPITHELIAL CELLS PRESENT     FEW GRAM POSITIVE COCCI IN CLUSTERS     FEW GRAM NEGATIVE RODS     Performed at [facility]   Culture     Final   Value: NORMAL OROPHARYNGEAL FLORA     Performed at [facility]   Report Status 08/03/2013 FINAL   Final   Studies/Results: Ct Abdomen Pelvis Wo Contrast  08/03/2013   *RADIOLOGY REPORT*  Clinical Data:  Bilateral lower extremity swelling.  End-stage renal disease and  polycystic kidney disease.  HIV.  CT CHEST, ABDOMEN AND PELVIS WITHOUT CONTRAST  Technique:  Multidetector CT imaging of the chest, abdomen and pelvis was performed following the standard protocol without IV contrast.  Comparison:  05/19/2013.  03/22/2012.  01/22/2011.  CT CHEST  Findings:  There is asymmetric diffuse ground-glass attenuation in the lungs.  In this patient with HIV, atypical infection merits consideration.  Low attenuation of the intravascular compartment is present suggesting anemia.  There is more focal consolidation in the medial right lower lobe.  Aspiration is considered unlikely. Cardiomegaly is present, possibly associated with the anemia. Small right pleural effusion.  No aggressive osseous lesions. Enlarged mediastinal lymph nodes are present, which may be congestive or reactive.  There is no axillary adenopathy.  IMPRESSION:  1.  Diffuse geographic ground-glass attenuation in the lungs with areas of sparing.  The differential considerations include infection including atypical agents in this patient with immunosuppression and asymmetric/atypical pulmonary edema. 2.  Anemia and cardiomegaly. 3.  Small right pleural effusion.  CT ABDOMEN AND PELVIS  Findings:  Polycystic kidney disease is present, with associated hepatic cyst.  Small amount of ascites is present.  The kidneys are massively enlarged and displays the other viscera anteriorly. There is no evidence of bowel obstruction allowing for noncontrast technique.  High attenuation in the right lower quadrant appears to reside within the wall of one of the renal cysts.  Small amount of ascites is present in the anatomic pelvis. Anasarca changes in the soft tissues.  No intra-abdominal free air. Chronic L2 compression fracture.  IMPRESSION:  1.  Polycystic kidney disease with associated renal cysts. 2.  The kidneys are massively enlarged and displays the other abdominal viscera.  Cysts are of varying density, with some simple low  attenuation cyst, either hemorrhagic and some calcified. 3.  Small volume of ascites. 4.  No intra-abdominal or retroperitoneal hemorrhage identified.  5.  Anasarca.   Original Report Authenticated By: Dereck Ligas, M.D.   Ct Chest Wo Contrast  08/03/2013   *RADIOLOGY REPORT*  Clinical Data:  Bilateral lower extremity swelling.  End-stage renal disease and polycystic kidney disease.  HIV.  CT CHEST, ABDOMEN AND PELVIS WITHOUT CONTRAST  Technique:  Multidetector CT imaging of the chest, abdomen and pelvis was performed following the standard protocol without IV contrast.  Comparison:  05/19/2013.  03/22/2012.  01/22/2011.  CT CHEST  Findings:  There is asymmetric diffuse ground-glass attenuation in the lungs.  In this patient with HIV, atypical infection merits consideration.  Low attenuation of the intravascular compartment is present suggesting anemia.  There is more focal consolidation in the medial right lower lobe.  Aspiration is considered unlikely. Cardiomegaly is present, possibly associated with the anemia. Small right pleural effusion.  No aggressive osseous lesions. Enlarged mediastinal lymph nodes are present, which may be congestive or reactive.  There is no axillary adenopathy.  IMPRESSION:  1.  Diffuse geographic ground-glass attenuation in the lungs with areas of sparing.  The differential considerations include infection including atypical agents in this patient with immunosuppression and asymmetric/atypical pulmonary edema. 2.  Anemia and cardiomegaly. 3.  Small right pleural effusion.  CT ABDOMEN AND PELVIS  Findings:  Polycystic kidney disease is present, with associated hepatic cyst.  Small amount of ascites is present.  The kidneys are massively enlarged and displays the other viscera anteriorly. There is no evidence of bowel obstruction allowing for noncontrast technique.  High attenuation in the right lower quadrant appears to reside within the wall of one of the renal cysts.  Small  amount of ascites is present in the anatomic pelvis. Anasarca changes in the soft tissues.  No intra-abdominal free air. Chronic L2 compression fracture.  IMPRESSION:  1.  Polycystic kidney disease with associated renal cysts. 2.  The kidneys are massively enlarged and displays the other abdominal viscera.  Cysts are of varying density, with some simple low attenuation cyst, either hemorrhagic and some calcified. 3.  Small volume of ascites. 4.  No intra-abdominal or retroperitoneal hemorrhage identified.  5.  Anasarca.   Original Report Authenticated By: Dereck Ligas, M.D.   Medications: I have reviewed the patient's current medications. Scheduled Meds: . calcium acetate  1,334 mg Oral TID WC  . ceFEPime (MAXIPIME) IV  500 mg Intravenous Q24H  . [START ON 08/09/2013] darbepoetin (ARANESP) injection - DIALYSIS  200 mcg Intravenous Q Mon-HD  . efavirenz  600 mg Oral QHS  . ferric gluconate (FERRLECIT/NULECIT) IV  125 mg Intravenous Daily  . lamiVUDine  50 mg Oral Daily  . multivitamin  1 tablet Oral Daily  . nicotine  14 mg Transdermal QPC supper  . polyethylene glycol  17 g Oral Daily  . zidovudine  300 mg Oral Daily   Continuous Infusions:   PRN Meds:.sodium chloride, sodium chloride, sodium chloride, sodium chloride, albuterol, alteplase, feeding supplement (NEPRO CARB STEADY), heparin, heparin, ibuprofen, lidocaine (PF), lidocaine-prilocaine, ondansetron (ZOFRAN) IV, ondansetron, oxyCODONE, pentafluoroprop-tetrafluoroeth Assessment/Plan:  #CAP vs. HCAP - History of acute development of productive cough with CXR showing lobar consolidation are most suggestive of CAP, however, the patient was recently hospitalized in 05/2013, therefre this must be treated as HCAP.  Borderline fevers noted (100.3-101.3 F) intially. Last fever was low grade at  100.8, last night at 7:00 PM. For now, the patient is still in a negative pressure room. With some mild improvement today. AFB smear negative x3. Blood  culture shows no growth so far. Legionella antigen and strep pneumo antigen -ve.  - Chest CT yesterday showed diffuse geographic ground-glass attenuation in the lungs with areas of sparing.  The differential considerations include infection including atypical agents in this patient with immunosuppression and asymmetric/atypical pulmonary edema.  - Consulted pulmonology today, will perform an inspection bronchoscopy in the AM. Given the patient's low platelets, a biopsy will not be obtained, but will obtain washings and send for bacterial cultures as well as AFB and PCP. - Also consulted ID. Recommendations pending at this time. Discussed possibility of PCP given his h/o of HIV, however his last CD4 count was 580. - Sent for CD4 count and viral load. - Continue on Vancomycin 1750 mg IVPB (adjusted by pharmacy as GFR ~10), plus Cefipime 500 mg IVPB qd for coverage of HCAP as patient was hospitalized recently in 05/2011. With minimal improvement since admission. - Follow up blood and sputum cultures. Still negative. - Albuterol inhaler prn wheezing  - Oxycodone 5-10mg  q6h prn pain  - Zofran prn nausea   #Macrocytic anemia - Hgb 6.8, MCV 108.8 on admission. Original differential diagnoses included acute blood loss (possible sources include sputum, stool, w/ +ve FOBT, urine, or internal hemorrhage as patient with multiple cysts in kidneys and liver, with some noted to have internal hyperdensities on CT 05/19/13 suggesting hemorrhage). Other causes of macrocytic anemia on the differential include drug induced anemia (on zidovidine), liver disease, alcohol abuse (patient claims he has not had alcohol in more than 3 months), folate or B12 deficiency (normal), hemolytic anemia (ruled out). Anemia panel points more towards a mixed pictures at this time including liver disease w/ significant polycystic findings, ACD, and iron deficiency. Iron 14, TIBC 212, and Ferritin 784. Still, bleeding cannot be ruled out. Today,  Hb is 6.6, down from 7.1. Transfused 1 unit during HD today. Receiving darbopoetin w/ dialysis. - CT abdomen relatively unchanged from previous CT. No evidence of bleeding w/in the peritoneum.  - Continue monitoring CBC Repeat in PM.   #Thrombocytopenia- Patient was admitted w/ platelets of 104, today they are 79, down from 91 yesterday. It is most likely that the patient has poor platelet production as well as poor platelet function 2/2 liver disease and significant azotemia.  - Patient denies any frank GI bleed. FOBT +ve on admission. Still w/ hemoptysis. For bronch tomorrow.  #Transaminitis - Yesterday, AST 163, ALT 149, TP 5.8, albumin 2.0, and Alk phos of 86. AST/ALT significantly improved from day previous. Elevation of liver enzymes possibly due to worsening cystic disease?. May also be 2/2 azithromycin use for HCAP. D/c'ed Azithromycin on 08/01/13. - Hepatitis serologies all negative. - Repeat CMET in AM. - Avoiding medications with tylenol.  #Electrolyte abnormalities- BMP improved since starting HD. We are limited in terms of IVF treatment options given ESRD. First session of HD 08/01/13. HD today. K+ 3.7 this AM.  #Supratherapeutic INR - 1.33 yesterday, much improved. - Holding coumadin still at this point. Repeat coags tomorrow in AM.  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 BUN 64. Still making urine. First session of HD 08/01/13, patient tolerated well. Had HD this AM. Patient has worsening LE edema.  #HTN - BP stable. 113/80 this PM. - Holding home norvasc   #HIV - Stable. Followed by Dr. Linus Salmons, last  CD4 580, viral load <20 in 6/14.  - Continue lamividine (dose decreased as per pharmacy d/t hepatic function), zidovidine, efavirenz  Dispo: Disposition is deferred at this time, awaiting improvement of current medical problems.  Anticipated discharge in approximately 2 day(s).   The patient does have a current PCP [name], MD) and does need an [facility] hospital  follow-up appointment after discharge.  The patient does not have transportation limitations that hinder transportation to clinic appointments.  .Services Needed at time of discharge: Y = Yes, Blank = No PT:   OT:   RN:   Equipment:   Other:     LOS: 6 days   Luanne Bras, MD 08/04/2013, 3:11 PM Pager: [phone]

## 2013-08-04 NOTE — Progress Notes (Signed)
[facility] Progress Note  Subjective:   Plan to eat after dialysis.  Tells me he negotiated a "reg diet" while in the hospital and plans to eat a renal diet after d/c.  Former [employer] resident. Has a place near [facility] to live after d/c.  No new complaints for me- still in neg pressure room- getting HD in room  Objective Filed Vitals:   08/04/13 0805 08/04/13 0815 08/04/13 0830 08/04/13 0900  BP: 119/82 117/85 123/85 119/85  Pulse: 107 112 105 107  Temp: 98.6 F (37 C)     TempSrc: Oral     Resp: 16 16 16 18   Height:      Weight:  101.8 kg (224 lb 6.9 oz)    SpO2: 92% 92%     Physical Exam  Goal 2.5 General: ill appearing, sallow Heart: tachy reg Lungs:  No wheezes or rales; dec BS Abdomen: distended generalized tenderness + BS Extremities:  Feet elevated 1 + edema; some bruising on legs Dialysis Access right upper AVF Qb 300   Assessment/Plan:  1. Cough/Fever- afebrile. On antibiotics- maxipime and vanc per primary team. Blood cultures no growth to date. AFB smear negative times several- can pt be taken out of respiratory isolation ???. Sputum cultures pending.   Quantiferon TB test pending . Chest CT 8/19 showed: ".  Diffuse geographic ground-glass attenuation in the lungs with areas of sparing.  The differential considerations include infection including atypical agents in this patient with immunosuppression and asymmetric/atypical pulmonary edema." Continuing to titrate volume down with serial dialysis. 2. ESRD - to be MWF at [facility].  first HD  8/17, 1 L off. Second 8/18 again one liter off secondary to soft BP- tolerated well. Third HD tmt today - goal 2.5 with AVF;  Of note, also has been sent to [facility] for possibility of transplant but financially he is not able to swing it so has taken himself out of consideration for now.  3. Anemia - hgb low s/p transfusion 8/14.  (HIV?) received Aranesp 100 last week - dose increased to 200, but pharm postponed til next week;  Hgb 6.6 today - plan transfuse 1 unit, give additional 100 Aranesp today; 200 for next week; continue ferrlicit ([id number])- agree- also Gi deferred invasive procedures 4. Secondary hyperparathyroidism - Ca+ 7.5, last phos 7.6 . Have added phoslo Last PTH was 262, no vitamin D as of yet  5. HTN/volume -  wt 101.8  - likely error - previously 88-90 kg; [name] He is edematous but maybe that is more from low albumin- UF is tough due to soft BP but will continue to challenge as able.  6. Nutrition - alb 2.4 on regular diet. dialysis should help  7. HIV- continue current therapy  8. Pancytopenia - WBC 6.1 on admission down to 2.9; platelets 104 down to 70; Hgb 6.6 ? Related to HIV 9. Transaminitis - trending down; serologies negative; follow 10.  Elevated INR - trending down - not on coumadin 11.  Disp - outpt HD arranged MWF2 at [facility] on [facility]; from a renal perspective he is cleared for d/c   [name], PA-C [place] [phone] 08/04/2013,9:09 AM  LOS: 6 days   Patient seen and examined, agree with above note with above modifications. New start to HD from our standpoint, continue with  Serial volume lowering- also max ESA and tranfusion today.  a number of issues per the primary team being managed such as pulm infection, elevated  liver enzymes- supratherapeutic INR Corliss Parish, MD 08/04/2013      Additional Objective Labs: Lab Results  Component Value Date   INR 1.52* 08/03/2013   INR 2.27* 08/02/2013   INR 4.09* 0000000    Basic Metabolic Panel:  Recent Labs Lab 08/01/13 0500 08/02/13 1056 08/03/13 1914  NA 125* 127* 131*  K 3.8 3.2* 3.4*  CL 83* 86* 91*  CO2 17* 23 23  GLUCOSE 151* 127* 148*  BUN 130* 94* 66*  CREATININE 11.75* 8.74* 6.09*  CALCIUM 8.0* 7.5* 7.5*  PHOS  --  7.6*  --    Liver Function Tests:  Recent Labs Lab 08/01/13 0500 08/02/13 1056 08/02/13 1147 08/03/13 1914  AST 430*  --  230* 163*  ALT 258*  --  192* 149*   ALKPHOS 57  --  74 86  BILITOT 0.4  --  0.4 0.3  PROT 5.8*  --  5.7* 5.8*  ALBUMIN 2.4* 2.1* 2.1* 2.0*  CBC:  Recent Labs Lab 07/29/13 1418  08/01/13 0500 08/02/13 1056 08/02/13 2235 08/03/13 0905 08/03/13 1321 08/04/13 0826  WBC 6.1  < > 4.1 3.1* 2.5* 3.2* 3.5* 2.9*  NEUTROABS 5.1  --  3.7  --   --   --   --  PENDING  HGB 6.8*  < > 7.5* 6.5* 7.1* 7.4* 7.9* 6.6*  HCT 19.8*  < > 20.8* 17.9* 19.4* 20.5* 21.5* 18.3*  MCV 108.8*  < > 101.0* 101.1* 99.5 100.0 98.6 99.5  PLT 104*  < > 99* 84* 82* 82* 91* 79*  < > = values in this interval not displayed. Blood Culture    Component Value Date/Time   SDES SPU 07/31/2013 1444   SDES SPUTUM 07/31/2013 1444   SPECREQUEST Immunocompromised 07/31/2013 1444   SPECREQUEST NONE 07/31/2013 1444   CULT  Value: CULTURE WILL BE EXAMINED FOR 6 WEEKS BEFORE ISSUING A FINAL REPORT Performed at [name] 07/31/2013 1444   CULT  Value: NORMAL OROPHARYNGEAL FLORA Performed at [facility] 07/31/2013 1444   REPTSTATUS PENDING 07/31/2013 1444   REPTSTATUS 08/03/2013 FINAL 07/31/2013 1444  Studies/Results: Ct Abdomen Pelvis Wo Contrast  08/03/2013   *RADIOLOGY REPORT*  Clinical Data:  Bilateral lower extremity swelling.  End-stage renal disease and polycystic kidney disease.  HIV.  CT CHEST, ABDOMEN AND PELVIS WITHOUT CONTRAST  Technique:  Multidetector CT imaging of the chest, abdomen and pelvis was performed following the standard protocol without IV contrast.  Comparison:  05/19/2013.  03/22/2012.  01/22/2011.  CT CHEST  Findings:  There is asymmetric diffuse ground-glass attenuation in the lungs.  In this patient with HIV, atypical infection merits consideration.  Low attenuation of the intravascular compartment is present suggesting anemia.  There is more focal consolidation in the medial right lower lobe.  Aspiration is considered unlikely. Cardiomegaly is present, possibly associated with the anemia. Small right pleural effusion.  No  aggressive osseous lesions. Enlarged mediastinal lymph nodes are present, which may be congestive or reactive.  There is no axillary adenopathy.  IMPRESSION:  1.  Diffuse geographic ground-glass attenuation in the lungs with areas of sparing.  The differential considerations include infection including atypical agents in this patient with immunosuppression and asymmetric/atypical pulmonary edema. 2.  Anemia and cardiomegaly. 3.  Small right pleural effusion.  CT ABDOMEN AND PELVIS  Findings:  Polycystic kidney disease is present, with associated hepatic cyst.  Small amount of ascites is present.  The kidneys are massively enlarged and displays the other viscera anteriorly. There  is no evidence of bowel obstruction allowing for noncontrast technique.  High attenuation in the right lower quadrant appears to reside within the wall of one of the renal cysts.  Small amount of ascites is present in the anatomic pelvis. Anasarca changes in the soft tissues.  No intra-abdominal free air. Chronic L2 compression fracture.  IMPRESSION:  1.  Polycystic kidney disease with associated renal cysts. 2.  The kidneys are massively enlarged and displays the other abdominal viscera.  Cysts are of varying density, with some simple low attenuation cyst, either hemorrhagic and some calcified. 3.  Small volume of ascites. 4.  No intra-abdominal or retroperitoneal hemorrhage identified.  5.  Anasarca.   Original Report Authenticated By: Dereck Ligas, M.D.   Medications:   . calcium acetate  1,334 mg Oral TID WC  . ceFEPime (MAXIPIME) IV  500 mg Intravenous Q24H  . [START ON 08/09/2013] darbepoetin (ARANESP) injection - DIALYSIS  200 mcg Intravenous Q Mon-HD  . efavirenz  600 mg Oral QHS  . ferric gluconate (FERRLECIT/NULECIT) IV  125 mg Intravenous Daily  . lamiVUDine  50 mg Oral Daily  . multivitamin  1 tablet Oral Daily  . nicotine  14 mg Transdermal QPC supper  . polyethylene glycol  17 g Oral Daily  . zidovudine  300 mg  Oral Daily

## 2013-08-04 NOTE — Progress Notes (Signed)
CRITICAL VALUE ALERT  Critical value received: Hgb 6.6  Date of notification:  08/04/13  Time of notification: 9:15  Critical value read back:yes  Nurse who received alert: Marlis Edelson  MD notified (1st page):  Amalia Hailey notified by Dialysis RN  Time of first page:  MD notified (2nd page):  Time of second page:  Responding MD: Amalia Hailey  Time MD responded: N/A

## 2013-08-04 NOTE — Progress Notes (Signed)
ANTIBIOTIC CONSULT NOTE - FOLLOW UP  Pharmacy Consult for Vancomycin/Cefepime Indication: rule out pneumonia  No Known Allergies  Patient Measurements: Height: 6\' 4"  (193 cm) Weight: 224 lb 6.9 oz (101.8 kg) IBW/kg (Calculated) : 86.8  Vital Signs: Temp: 98.8 F (37.1 C) (08/20 1045) Temp src: Oral (08/20 1045) BP: 116/83 mmHg (08/20 1100) Pulse Rate: 105 (08/20 1100) Intake/Output from previous day: 08/19 0701 - 08/20 0700 In: 1720 [P.O.:1560; IV Piggyback:160] Out: -  Intake/Output from this shift: Total I/O In: 350 [Blood:350] Out: -   Labs:  Recent Labs  08/02/13 1056  08/03/13 0905 08/03/13 1321 08/03/13 1914 08/04/13 0826 08/04/13 0828  WBC 3.1*  < > 3.2* 3.5*  --  2.9*  --   HGB 6.5*  < > 7.4* 7.9*  --  6.6*  --   PLT 84*  < > 82* 91*  --  79*  --   CREATININE 8.74*  --   --   --  6.09*  --  6.49*  < > = values in this interval not displayed. Estimated Creatinine Clearance: 18.8 ml/min (by C-G formula based on Cr of 6.49).  Recent Labs  08/04/13 0830  VANCORANDOM 11.8     Microbiology: Recent Results (from the past 720 hour(s))  CULTURE, BLOOD (SINGLE)     Status: None   Collection Time    07/29/13  2:18 PM      Result Value Range Status   Organism ID, Bacteria NO GROWTH 5 DAYS   Final  MRSA PCR SCREENING     Status: None   Collection Time    07/29/13  6:15 PM      Result Value Range Status   MRSA by PCR NEGATIVE  NEGATIVE Final   Comment:            The GeneXpert MRSA Assay (FDA     approved for NASAL specimens     only), is one component of a     comprehensive MRSA colonization     surveillance program. It is not     intended to diagnose MRSA     infection nor to guide or     monitor treatment for     MRSA infections.  CULTURE, BLOOD (ROUTINE X 2)     Status: None   Collection Time    07/29/13 11:10 PM      Result Value Range Status   Specimen Description BLOOD LEFT ARM   Final   Special Requests BOTTLES DRAWN AEROBIC AND ANAEROBIC  10CC   Final   Culture  Setup Time     Final   Value: 07/30/2013 06:02     Performed at [facility]   Culture     Final   Value:        BLOOD CULTURE RECEIVED NO GROWTH TO DATE CULTURE WILL BE HELD FOR 5 DAYS BEFORE ISSUING A FINAL NEGATIVE REPORT     Performed at [facility]   Report Status PENDING   Incomplete  CULTURE, BLOOD (ROUTINE X 2)     Status: None   Collection Time    07/29/13 11:16 PM      Result Value Range Status   Specimen Description BLOOD LEFT HAND   Final   Special Requests BOTTLES DRAWN AEROBIC AND ANAEROBIC 5CC   Final   Culture  Setup Time     Final   Value: 07/30/2013 06:02     Performed at [facility]  Final   Value:        BLOOD CULTURE RECEIVED NO GROWTH TO DATE CULTURE WILL BE HELD FOR 5 DAYS BEFORE ISSUING A FINAL NEGATIVE REPORT     Performed at [facility]   Report Status PENDING   Incomplete  CULTURE, EXPECTORATED SPUTUM-ASSESSMENT     Status: None   Collection Time    07/30/13 12:45 AM      Result Value Range Status   Specimen Description SPUTUM   Final   Special Requests NONE   Final   Sputum evaluation     Final   Value: MICROSCOPIC FINDINGS SUGGEST THAT THIS SPECIMEN IS NOT REPRESENTATIVE OF LOWER RESPIRATORY SECRETIONS. PLEASE RECOLLECT.     CALLED TO MOORE,K RN 07/30/2013 0347 JORDANS   Report Status 07/30/2013 FINAL   Final  CULTURE, EXPECTORATED SPUTUM-ASSESSMENT     Status: None   Collection Time    07/30/13  3:08 PM      Result Value Range Status   Specimen Description SPUTUM   Final   Special Requests NONE   Final   Sputum evaluation     Final   Value: MICROSCOPIC FINDINGS SUGGEST THAT THIS SPECIMEN IS NOT REPRESENTATIVE OF LOWER RESPIRATORY SECRETIONS. PLEASE RECOLLECT.     NOTIFIED A TONEY 1605 07/30/13 A BROWNING   Report Status 07/30/2013 FINAL   Final  AFB CULTURE WITH SMEAR     Status: None   Collection Time    07/30/13  3:08 PM      Result Value Range Status   Specimen  Description SPUTUM   Final   Special Requests NONE   Final   ACID FAST SMEAR     Final   Value: NO ACID FAST BACILLI SEEN     Performed at [facility]   Culture     Final   Value: CULTURE WILL BE EXAMINED FOR 6 WEEKS BEFORE ISSUING A FINAL REPORT     Performed at [facility]   Report Status PENDING   Incomplete  CULTURE, EXPECTORATED SPUTUM-ASSESSMENT     Status: None   Collection Time    07/30/13  8:41 PM      Result Value Range Status   Specimen Description SPUTUM   Final   Special Requests NONE   Final   Sputum evaluation     Final   Value: MICROSCOPIC FINDINGS SUGGEST THAT THIS SPECIMEN IS NOT REPRESENTATIVE OF LOWER RESPIRATORY SECRETIONS. PLEASE RECOLLECT.     Osborne Casco RN [id number] 07/30/13 A BROWNING   Report Status 07/30/2013 FINAL   Final  AFB CULTURE WITH SMEAR     Status: None   Collection Time    07/30/13  8:41 PM      Result Value Range Status   Specimen Description SPUTUM   Final   Special Requests Immunocompromised   Final   ACID FAST SMEAR     Final   Value: NO ACID FAST BACILLI SEEN     Performed at [facility]   Culture     Final   Value: CULTURE WILL BE EXAMINED FOR 6 WEEKS BEFORE ISSUING A FINAL REPORT     Performed at [facility]   Report Status PENDING   Incomplete  AFB CULTURE WITH SMEAR     Status: None   Collection Time    07/31/13  2:44 PM      Result Value Range Status   Specimen Description SPU   Final   Special Requests Immunocompromised  Final   ACID FAST SMEAR     Final   Value: NO ACID FAST BACILLI SEEN     Performed at [facility]   Culture     Final   Value: CULTURE WILL BE EXAMINED FOR 6 WEEKS BEFORE ISSUING A FINAL REPORT     Performed at [facility]   Report Status PENDING   Incomplete  CULTURE, RESPIRATORY (NON-EXPECTORATED)     Status: None   Collection Time    07/31/13  2:44 PM      Result Value Range Status   Specimen Description SPUTUM   Final   Special Requests NONE    Final   Gram Stain     Final   Value: RARE WBC PRESENT, PREDOMINANTLY PMN     FEW SQUAMOUS EPITHELIAL CELLS PRESENT     FEW GRAM POSITIVE COCCI IN CLUSTERS     FEW GRAM NEGATIVE RODS     Performed at [facility]   Culture     Final   Value: NORMAL OROPHARYNGEAL FLORA     Performed at [facility]   Report Status 08/03/2013 FINAL   Final    Anti-infectives   Start     Dose/Rate Route Frequency Ordered Stop   08/04/13 1200  vancomycin (VANCOCIN) 1,250 mg in sodium chloride 0.9 % 250 mL IVPB     1,250 mg 166.7 mL/hr over 90 Minutes Intravenous  Once 08/04/13 1111     08/02/13 0845  vancomycin (VANCOCIN) 500 mg in sodium chloride 0.9 % 100 mL IVPB     500 mg 100 mL/hr over 60 Minutes Intravenous Once in dialysis 08/02/13 0830 08/02/13 1726   08/01/13 2000  lamiVUDine (EPIVIR) 10 MG/ML solution 50 mg     50 mg Oral Daily 08/01/13 0935     08/01/13 1430  vancomycin (VANCOCIN) 500 mg in sodium chloride 0.9 % 100 mL IVPB     500 mg 100 mL/hr over 60 Minutes Intravenous  Once 08/01/13 1323 08/01/13 1630   07/30/13 1830  vancomycin (VANCOCIN) 1,750 mg in sodium chloride 0.9 % 500 mL IVPB     1,750 mg 250 mL/hr over 120 Minutes Intravenous  Once 07/30/13 1718 07/30/13 2237   07/30/13 1800  ceFEPIme (MAXIPIME) 500 mg in dextrose 5 % 50 mL IVPB     500 mg 100 mL/hr over 30 Minutes Intravenous Every 24 hours 07/30/13 1718     07/30/13 0117  zidovudine (RETROVIR) capsule 300 mg     300 mg Oral Daily 07/29/13 2155     07/30/13 0117  lamiVUDine (EPIVIR) 10 MG/ML solution 100 mg  Status:  Discontinued     100 mg Oral Daily 07/29/13 2201 08/01/13 0935   07/29/13 2300  cefTRIAXone (ROCEPHIN) 1 g in dextrose 5 % 50 mL IVPB  Status:  Discontinued     1 g 100 mL/hr over 30 Minutes Intravenous Every 24 hours 07/29/13 2141 07/30/13 1626   07/29/13 2300  azithromycin (ZITHROMAX) tablet 500 mg  Status:  Discontinued     500 mg Oral Every 24 hours 07/29/13 2141 08/02/13 1631   07/29/13  2300  efavirenz (SUSTIVA) tablet 600 mg     600 mg Oral Daily at bedtime 07/29/13 2152     07/29/13 2200  zidovudine (RETROVIR) tablet 300 mg  Status:  Discontinued     300 mg Oral Daily 07/29/13 2152 07/29/13 2153   07/29/13 2200  lamivudine (EPIVIR) tablet 100 mg  Status:  Discontinued  100 mg Oral Daily 07/29/13 2152 07/29/13 2158      Assessment: 37 y/o M on vancomycin/cefepime for HCAP coverage. WBC 2.9, now ESRD new to HD. To have MWF schedule for HD. Tolerated HD today for 4 hours with BFR 300. Vancomycin random today was slightly low so will give some extra vancomycin today.   8/15 Vanc>> 8/15 Cefepime>> 8/14 Azithro>>8/17 8/14 CTX>>8/15  Resp>>nml flora BCx>>pend AFB smear x 3> no AFB  Pre-HD VR 8/20>>11.8  Goal of Therapy:  Pre-HD vancomycin level 15-25 mg/L  Plan:  -Vancomycin 1250 mg IV x 1 today post-HD -F/U HD tolerance, if tolerating full sessions at BFR 350-400, give vancomycin 1000mg  IV qHD -F/U length of treatment with antibiotics -As well, f/u plans for restarting oral anticoagulation  Thank you for allowing me to take part in this patient's care,  Narda Bonds, PharmD Clinical Pharmacist Phone: [phone] Pager: [phone] 08/04/2013 11:19 AM

## 2013-08-04 NOTE — Progress Notes (Signed)
Pt received 1 unit PRBCs; transfusion completed without complications. Pt denies SOB, dizziness, and pain.
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
## 2013-08-04 NOTE — Procedures (Signed)
Patient was seen on dialysis and the procedure was supervised.  BFR 300  Via AVF BP is  122/85.   Patient appears to be tolerating treatment well  Brian [name] A 08/04/2013

## 2013-08-04 NOTE — Consult Note (Addendum)
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 have put him into risk for PCP, which he may now have   Thank you so much for this interesting consult  [facility] [place] [phone] (pager) [phone] (office) 08/04/2013, 5:48 PM  Rhina Brack[place] 08/04/2013, 5:48 PM

## 2013-08-05 ENCOUNTER — Inpatient Hospital Stay (HOSPITAL_COMMUNITY): Payer: Medicaid Other

## 2013-08-05 ENCOUNTER — Encounter (HOSPITAL_COMMUNITY)
Admission: AD | Disposition: A | Discharge status: Home / Self Care | Payer: Self-pay | Source: Ambulatory Visit | Attending: Internal Medicine

## 2013-08-05 HISTORY — PX: VIDEO BRONCHOSCOPY: SHX5072

## 2013-08-05 LAB — COMPREHENSIVE METABOLIC PANEL
ALT: 126 U/L — ABNORMAL HIGH (ref 0–53)
Alkaline Phosphatase: 84 U/L (ref 39–117)
CO2: 25 mEq/L (ref 19–32)
Chloride: 93 mEq/L — ABNORMAL LOW (ref 96–112)
GFR calc Af Amer: 15 mL/min — ABNORMAL LOW (ref >90)
GFR calc non Af Amer: 13 mL/min — ABNORMAL LOW (ref >90)
Glucose, Bld: 126 mg/dL — ABNORMAL HIGH (ref 70–99)
Potassium: 4 mEq/L (ref 3.5–5.1)
Sodium: 132 mEq/L — ABNORMAL LOW (ref 135–145)
Total Bilirubin: 0.5 mg/dL (ref 0.3–1.2)

## 2013-08-05 LAB — CBC WITH DIFFERENTIAL/PLATELET
Hemoglobin: 8.4 g/dL — ABNORMAL LOW (ref 13.0–17.0)
Lymphocytes Relative: 17 % (ref 12–46)
Lymphs Abs: 0.6 10*3/uL — ABNORMAL LOW (ref 0.7–4.0)
MCV: 97.1 fL (ref 78.0–100.0)
Neutrophils Relative %: 77 % (ref 43–77)
Platelets: 95 10*3/uL — ABNORMAL LOW (ref 150–400)
RBC: 2.39 MIL/uL — ABNORMAL LOW (ref 4.22–5.81)
WBC: 3.4 10*3/uL — ABNORMAL LOW (ref 4.0–10.5)

## 2013-08-05 LAB — CULTURE, BLOOD (ROUTINE X 2): Culture: NO GROWTH

## 2013-08-05 LAB — T-HELPER CELLS (CD4) COUNT (NOT AT ARMC): CD4 % Helper T Cell: 56 % — ABNORMAL HIGH (ref 33–55)

## 2013-08-05 LAB — PNEUMOCYSTIS JIROVECI SMEAR BY DFA

## 2013-08-05 LAB — TYPE AND SCREEN
ABO/RH(D): A POS
Unit division: 0

## 2013-08-05 LAB — PROTIME-INR: INR: 1.32 (ref 0.00–1.49)

## 2013-08-05 SURGERY — VIDEO BRONCHOSCOPY WITHOUT FLUORO
Anesthesia: Moderate Sedation | Laterality: Bilateral

## 2013-08-05 MED ORDER — ALBUTEROL SULFATE (5 MG/ML) 0.5% IN NEBU
INHALATION_SOLUTION | RESPIRATORY_TRACT | Status: AC
  Administered 2013-08-05: 2.5 mg via RESPIRATORY_TRACT
  Filled 2013-08-05: qty 0.5

## 2013-08-05 MED ORDER — PHENYLEPHRINE HCL 0.25 % NA SOLN
NASAL | Status: DC | PRN
  Administered 2013-08-05: 2 via NASAL

## 2013-08-05 MED ORDER — MIDAZOLAM HCL 10 MG/2ML IJ SOLN
INTRAMUSCULAR | Status: DC | PRN
  Administered 2013-08-05 (×4): 1 mg via INTRAVENOUS

## 2013-08-05 MED ORDER — LIDOCAINE HCL 2 % EX GEL
CUTANEOUS | Status: DC | PRN
  Administered 2013-08-05: 1

## 2013-08-05 MED ORDER — LIDOCAINE HCL (PF) 1 % IJ SOLN
INTRAMUSCULAR | Status: DC | PRN
  Administered 2013-08-05: 6 mL
  Administered 2013-08-05: 5 mL

## 2013-08-05 MED ORDER — FENTANYL CITRATE 0.05 MG/ML IJ SOLN
INTRAMUSCULAR | Status: DC | PRN
  Administered 2013-08-05 (×4): 25 ug via INTRAVENOUS

## 2013-08-05 MED ORDER — ALBUTEROL SULFATE (5 MG/ML) 0.5% IN NEBU: 2.5000 mg | INHALATION_SOLUTION | Freq: Once | RESPIRATORY_TRACT | Status: AC

## 2013-08-05 MED ORDER — NYSTATIN 100000 UNIT/GM EX POWD
Freq: Two times a day (BID) | CUTANEOUS | Status: DC
  Administered 2013-08-06: 22:00:00 via TOPICAL
  Filled 2013-08-05 (×3): qty 15

## 2013-08-05 MED ORDER — ZOLPIDEM TARTRATE 5 MG PO TABS
5.0000 mg | ORAL_TABLET | Freq: Once | ORAL | Status: AC
  Administered 2013-08-05: 5 mg via ORAL
  Filled 2013-08-05: qty 1

## 2013-08-05 NOTE — Progress Notes (Signed)
Video bronchoscopy procedure performed. Bronchial washing intervention performed.

## 2013-08-05 NOTE — Progress Notes (Signed)
Subjective: [name] is a 37 y.o. man with history of HIV (followed by Dr. 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  Patient seen at bedside this AM. Says he is feeling slightly improved today. Went for bronchoscopy this AM w/out any complications. No HD today. Denies any nausea, vomiting, fever, or chills. Still w/ cough.   Objective: Vital signs in last 24 hours: Filed Vitals:   08/04/13 1900 08/04/13 2131 08/04/13 2208 08/05/13 0436  BP: 103/60 117/81  121/83  Pulse:  103  107  Temp:  98.1 F (36.7 C)  97.7 F (36.5 C)  TempSrc:  Oral  Oral  Resp:  16  18  Height:  6\' 4"  (1.93 m)    Weight:  200 lb (90.719 kg)    SpO2:  94% 92% 96%   Weight change: 24 lb 14.6 oz (11.299 kg)  Intake/Output Summary (Last 24 hours) at 08/05/13 0656 Last data filed at 08/05/13 0600  Gross per 24 hour  Intake    750 ml  Output   1893 ml  Net  -1143 ml   Physical Exam: General: Alert, cooperative, and in no apparent distress HEENT: Vision grossly intact, oropharynx clear and non-erythematous  Neck: Full range of motion without pain, supple, no lymphadenopathy or carotid bruits Lungs: Clear to ascultation bilaterally. Crackles to auscultation of right lung.  Heart: Regular rate and rhythm, no murmurs, gallops, or rubs Abdomen: Distended, with tenderness to palpation. Mass palpated in R LQ, most likely part of his PCK. Liver palpated on exam. Bowel sounds present. Has small amount of scrotal edema w/ mild erythema. Extremities: +2 pitting edema in the LE's. Slightly better than yesterday. No cyanosis or clubbing. Neurologic: Alert & oriented X3, cranial nerves II-XII intact, strength grossly intact, sensation intact to light touch.  Lab Results: Basic Metabolic Panel:  Recent Labs Lab 08/02/13 1056 08/03/13 1914  08/04/13 0828  NA 127* 131* 127*  K 3.2* 3.4* 3.7  CL 86* 91* 90*  CO2 23 23 19   GLUCOSE 127* 148* 110*  BUN 94* 66* 64*  CREATININE 8.74* 6.09* 6.49*  CALCIUM 7.5* 7.5* 7.7*  PHOS 7.6*  --  5.3*   Liver Function Tests:  Recent Labs Lab 08/02/13 1147 08/03/13 1914 08/04/13 0828  AST 230* 163*  --   ALT 192* 149*  --   ALKPHOS 74 86  --   BILITOT 0.4 0.3  --   PROT 5.7* 5.8*  --   ALBUMIN 2.1* 2.0* 2.0*   No results found for this basename: LIPASE, AMYLASE,  in the last 168 hours No results found for this basename: AMMONIA,  in the last 168 hours CBC:  Recent Labs Lab 08/01/13 0500  08/03/13 1321 08/04/13 0826  WBC 4.1  < > 3.5* 2.9*  NEUTROABS 3.7  --   --  2.3  HGB 7.5*  < > 7.9* 6.6*  HCT 20.8*  < > 21.5* 18.3*  MCV 101.0*  < > 98.6 99.5  PLT 99*  < > 91* 79*  < > = values in this interval not displayed. Coagulation:  Recent Labs Lab 08/01/13 0830 08/02/13 1056 08/03/13 1914 08/04/13 0800  LABPROT 38.1* 24.3* 17.9* 16.2*  INR 4.09* 2.27* 1.52* 1.33   Anemia Panel:  Recent Labs Lab 07/29/13 2310  VITAMINB12 482  FOLATE 16.0  FERRITIN 784*  TIBC 212*  IRON 14*  RETICCTPCT 0.8   Urine Drug Screen: Drugs of Abuse     Component Value Date/Time   LABOPIA NEG 03/26/2013 1519   LABOPIA POSITIVE* 09/22/2010 1140   COCAINSCRNUR NEG 03/26/2013 1519   COCAINSCRNUR NONE DETECTED 09/22/2010 1140   LABBENZ NEG 03/26/2013 1519   LABBENZ NEG 07/30/2011 1656   LABBENZ POSITIVE* 09/22/2010 1140   AMPHETMU NEG 07/30/2011 1656   AMPHETMU NONE DETECTED 09/22/2010 1140   THCU NEG 03/26/2013 1519   THCU NONE DETECTED 09/22/2010 1140   LABBARB NEG 03/26/2013 1519   LABBARB  Value: NONE DETECTED        DRUG SCREEN FOR MEDICAL PURPOSES ONLY.  IF CONFIRMATION IS NEEDED FOR ANY PURPOSE, NOTIFY LAB WITHIN 5 DAYS.        LOWEST DETECTABLE LIMITS FOR URINE DRUG SCREEN Drug Class       Cutoff (ng/mL) Amphetamine      1000 Barbiturate      200 Benzodiazepine   [id number] Tricyclics        [id number] Opiates          300 Cocaine          300 THC              50 09/22/2010 1140    Micro Results: Recent Results (from the past 240 hour(s))  CULTURE, BLOOD (SINGLE)     Status: None   Collection Time    07/29/13  2:18 PM      Result Value Range Status   Organism ID, Bacteria NO GROWTH 5 DAYS   Final  MRSA PCR SCREENING     Status: None   Collection Time    07/29/13  6:15 PM      Result Value Range Status   MRSA by PCR NEGATIVE  NEGATIVE Final   Comment:            The GeneXpert MRSA Assay (FDA     approved for NASAL specimens     only), is one component of a     comprehensive MRSA colonization     surveillance program. It is not     intended to diagnose MRSA     infection nor to guide or     monitor treatment for     MRSA infections.  CULTURE, BLOOD (ROUTINE X 2)     Status: None   Collection Time    07/29/13 11:10 PM      Result Value Range Status   Specimen Description BLOOD LEFT ARM   Final   Special Requests BOTTLES DRAWN AEROBIC AND ANAEROBIC 10CC   Final   Culture  Setup Time     Final   Value: 07/30/2013 06:02     Performed at [facility]   Culture     Final   Value:        BLOOD CULTURE RECEIVED NO GROWTH TO DATE CULTURE WILL BE HELD FOR 5 DAYS BEFORE ISSUING A FINAL NEGATIVE REPORT     Performed at [facility]   Report Status PENDING   Incomplete  CULTURE, BLOOD (ROUTINE X 2)     Status: None   Collection Time    07/29/13 11:16 PM      Result Value Range Status   Specimen Description BLOOD LEFT HAND   Final   Special Requests BOTTLES DRAWN AEROBIC AND ANAEROBIC 5CC   Final   Culture  Setup Time     Final  Value: 07/30/2013 06:02     Performed at [facility]   Culture     Final   Value:        BLOOD CULTURE RECEIVED NO GROWTH TO DATE CULTURE WILL BE HELD FOR 5 DAYS BEFORE ISSUING A FINAL NEGATIVE REPORT     Performed at [facility]   Report Status PENDING   Incomplete  CULTURE, EXPECTORATED SPUTUM-ASSESSMENT      Status: None   Collection Time    07/30/13 12:45 AM      Result Value Range Status   Specimen Description SPUTUM   Final   Special Requests NONE   Final   Sputum evaluation     Final   Value: MICROSCOPIC FINDINGS SUGGEST THAT THIS SPECIMEN IS NOT REPRESENTATIVE OF LOWER RESPIRATORY SECRETIONS. PLEASE RECOLLECT.     CALLED TO MOORE,K RN 07/30/2013 0347 JORDANS   Report Status 07/30/2013 FINAL   Final  CULTURE, EXPECTORATED SPUTUM-ASSESSMENT     Status: None   Collection Time    07/30/13  3:08 PM      Result Value Range Status   Specimen Description SPUTUM   Final   Special Requests NONE   Final   Sputum evaluation     Final   Value: MICROSCOPIC FINDINGS SUGGEST THAT THIS SPECIMEN IS NOT REPRESENTATIVE OF LOWER RESPIRATORY SECRETIONS. PLEASE RECOLLECT.     NOTIFIED A TONEY 1605 07/30/13 A BROWNING   Report Status 07/30/2013 FINAL   Final  AFB CULTURE WITH SMEAR     Status: None   Collection Time    07/30/13  3:08 PM      Result Value Range Status   Specimen Description SPUTUM   Final   Special Requests NONE   Final   ACID FAST SMEAR     Final   Value: NO ACID FAST BACILLI SEEN     Performed at [facility]   Culture     Final   Value: CULTURE WILL BE EXAMINED FOR 6 WEEKS BEFORE ISSUING A FINAL REPORT     Performed at [facility]   Report Status PENDING   Incomplete  CULTURE, EXPECTORATED SPUTUM-ASSESSMENT     Status: None   Collection Time    07/30/13  8:41 PM      Result Value Range Status   Specimen Description SPUTUM   Final   Special Requests NONE   Final   Sputum evaluation     Final   Value: MICROSCOPIC FINDINGS SUGGEST THAT THIS SPECIMEN IS NOT REPRESENTATIVE OF LOWER RESPIRATORY SECRETIONS. PLEASE RECOLLECT.     Osborne Casco RN 2235 07/30/13 A BROWNING   Report Status 07/30/2013 FINAL   Final  AFB CULTURE WITH SMEAR     Status: None   Collection Time    07/30/13  8:41 PM      Result Value Range Status   Specimen Description SPUTUM   Final    Special Requests Immunocompromised   Final   ACID FAST SMEAR     Final   Value: NO ACID FAST BACILLI SEEN     Performed at [facility]   Culture     Final   Value: CULTURE WILL BE EXAMINED FOR 6 WEEKS BEFORE ISSUING A FINAL REPORT     Performed at [facility]   Report Status PENDING   Incomplete  AFB CULTURE WITH SMEAR     Status: None   Collection Time    07/31/13  2:44 PM  Result Value Range Status   Specimen Description SPU   Final   Special Requests Immunocompromised   Final   ACID FAST SMEAR     Final   Value: NO ACID FAST BACILLI SEEN     Performed at [facility]   Culture     Final   Value: CULTURE WILL BE EXAMINED FOR 6 WEEKS BEFORE ISSUING A FINAL REPORT     Performed at [facility]   Report Status PENDING   Incomplete  CULTURE, RESPIRATORY (NON-EXPECTORATED)     Status: None   Collection Time    07/31/13  2:44 PM      Result Value Range Status   Specimen Description SPUTUM   Final   Special Requests NONE   Final   Gram Stain     Final   Value: RARE WBC PRESENT, PREDOMINANTLY PMN     FEW SQUAMOUS EPITHELIAL CELLS PRESENT     FEW GRAM POSITIVE COCCI IN CLUSTERS     FEW GRAM NEGATIVE RODS     Performed at [facility]   Culture     Final   Value: NORMAL OROPHARYNGEAL FLORA     Performed at [facility]   Report Status 08/03/2013 FINAL   Final   Studies/Results: Ct Abdomen Pelvis Wo Contrast  08/03/2013   *RADIOLOGY REPORT*  Clinical Data:  Bilateral lower extremity swelling.  End-stage renal disease and polycystic kidney disease.  HIV.  CT CHEST, ABDOMEN AND PELVIS WITHOUT CONTRAST  Technique:  Multidetector CT imaging of the chest, abdomen and pelvis was performed following the standard protocol without IV contrast.  Comparison:  05/19/2013.  03/22/2012.  01/22/2011.  CT CHEST  Findings:  There is asymmetric diffuse ground-glass attenuation in the lungs.  In this patient with HIV, atypical infection merits  consideration.  Low attenuation of the intravascular compartment is present suggesting anemia.  There is more focal consolidation in the medial right lower lobe.  Aspiration is considered unlikely. Cardiomegaly is present, possibly associated with the anemia. Small right pleural effusion.  No aggressive osseous lesions. Enlarged mediastinal lymph nodes are present, which may be congestive or reactive.  There is no axillary adenopathy.  IMPRESSION:  1.  Diffuse geographic ground-glass attenuation in the lungs with areas of sparing.  The differential considerations include infection including atypical agents in this patient with immunosuppression and asymmetric/atypical pulmonary edema. 2.  Anemia and cardiomegaly. 3.  Small right pleural effusion.  CT ABDOMEN AND PELVIS  Findings:  Polycystic kidney disease is present, with associated hepatic cyst.  Small amount of ascites is present.  The kidneys are massively enlarged and displays the other viscera anteriorly. There is no evidence of bowel obstruction allowing for noncontrast technique.  High attenuation in the right lower quadrant appears to reside within the wall of one of the renal cysts.  Small amount of ascites is present in the anatomic pelvis. Anasarca changes in the soft tissues.  No intra-abdominal free air. Chronic L2 compression fracture.  IMPRESSION:  1.  Polycystic kidney disease with associated renal cysts. 2.  The kidneys are massively enlarged and displays the other abdominal viscera.  Cysts are of varying density, with some simple low attenuation cyst, either hemorrhagic and some calcified. 3.  Small volume of ascites. 4.  No intra-abdominal or retroperitoneal hemorrhage identified.  5.  Anasarca.   Original Report Authenticated By: Dereck Ligas, M.D.   Ct Chest Wo Contrast  08/03/2013   *RADIOLOGY REPORT*  Clinical Data:  Bilateral  lower extremity swelling.  End-stage renal disease and polycystic kidney disease.  HIV.  CT CHEST, ABDOMEN AND  PELVIS WITHOUT CONTRAST  Technique:  Multidetector CT imaging of the chest, abdomen and pelvis was performed following the standard protocol without IV contrast.  Comparison:  05/19/2013.  03/22/2012.  01/22/2011.  CT CHEST  Findings:  There is asymmetric diffuse ground-glass attenuation in the lungs.  In this patient with HIV, atypical infection merits consideration.  Low attenuation of the intravascular compartment is present suggesting anemia.  There is more focal consolidation in the medial right lower lobe.  Aspiration is considered unlikely. Cardiomegaly is present, possibly associated with the anemia. Small right pleural effusion.  No aggressive osseous lesions. Enlarged mediastinal lymph nodes are present, which may be congestive or reactive.  There is no axillary adenopathy.  IMPRESSION:  1.  Diffuse geographic ground-glass attenuation in the lungs with areas of sparing.  The differential considerations include infection including atypical agents in this patient with immunosuppression and asymmetric/atypical pulmonary edema. 2.  Anemia and cardiomegaly. 3.  Small right pleural effusion.  CT ABDOMEN AND PELVIS  Findings:  Polycystic kidney disease is present, with associated hepatic cyst.  Small amount of ascites is present.  The kidneys are massively enlarged and displays the other viscera anteriorly. There is no evidence of bowel obstruction allowing for noncontrast technique.  High attenuation in the right lower quadrant appears to reside within the wall of one of the renal cysts.  Small amount of ascites is present in the anatomic pelvis. Anasarca changes in the soft tissues.  No intra-abdominal free air. Chronic L2 compression fracture.  IMPRESSION:  1.  Polycystic kidney disease with associated renal cysts. 2.  The kidneys are massively enlarged and displays the other abdominal viscera.  Cysts are of varying density, with some simple low attenuation cyst, either hemorrhagic and some calcified. 3.   Small volume of ascites. 4.  No intra-abdominal or retroperitoneal hemorrhage identified.  5.  Anasarca.   Original Report Authenticated By: Dereck Ligas, M.D.   Medications: I have reviewed the patient's current medications. Scheduled Meds: . abacavir  600 mg Oral Daily  . calcium acetate  1,334 mg Oral TID WC  . ceFEPime (MAXIPIME) IV  500 mg Intravenous Q24H  . [START ON 08/09/2013] darbepoetin (ARANESP) injection - DIALYSIS  200 mcg Intravenous Q Mon-HD  . efavirenz  600 mg Oral QHS  . ferric gluconate (FERRLECIT/NULECIT) IV  125 mg Intravenous Daily  . lamiVUDine  50 mg Oral Daily  . multivitamin  1 tablet Oral Daily  . nicotine  14 mg Transdermal QPC supper  . polyethylene glycol  17 g Oral Daily   Continuous Infusions:   PRN Meds:.sodium chloride, sodium chloride, sodium chloride, sodium chloride, albuterol, feeding supplement (NEPRO CARB STEADY), heparin, heparin, ibuprofen, lidocaine (PF), lidocaine (PF), lidocaine, lidocaine-prilocaine, ondansetron (ZOFRAN) IV, ondansetron, oxyCODONE, pentafluoroprop-tetrafluoroeth, phenylephrine Assessment/Plan:  #CAP vs. HCAP - History of acute development of productive cough with CXR showing lobar consolidation are most suggestive of CAP, however, the patient was recently hospitalized in 05/2013, therefre this must be treated as HCAP.  Borderline fevers noted (100.3-101.3 F) initially. No fevers overnight For now, the patient is still in a negative pressure room. Seems improved today. AFB smear negative x3. Blood culture shows no growth so far. Legionella antigen and strep pneumo antigen -ve.  - Chest CT on 08/03/13 showed diffuse geographic ground-glass attenuation in the lungs with areas of sparing. The differential considerations include infection including atypical agents in this  patient with immunosuppression and asymmetric/atypical pulmonary edema.  - Consulted pulmonology yesterday, much appreciate the consult. Performed an inspection  bronchoscopy this AM. Sent bronchial washings for PCP, AFB.  - Also consulted ID, much appreciate the consult. Feel that the patient's findings are more suspicious for PCP pna at this time given his CT findings, elevated LDH, and poor response to vanco and cefipime. Still concerns for TB.  -Sent for viral respiratory panel.  -D/c'ed Vanco and cefipime as he is not improving on this regimen. Do not suspect MRSA or pseudomonal pna at this time. - Follow up blood and sputum cultures. Still negative. - Albuterol inhaler prn wheezing  - Oxycodone 5-10mg  q6h prn pain  - Zofran prn nausea   #Macrocytic anemia - Today, Hb is 8.4. Transfused 1 unit during HD yesterday. Receiving darbopoetin w/ dialysis. - CT abdomen on 08/03/13 relatively unchanged from previous CT. No evidence of bleeding w/in the peritoneum.  - Continue monitoring CBC.   #Thrombocytopenia- Patient was admitted w/ platelets of 104, today they are 95, improved from 2. Seen by ID yesterday, feel that pancytopenia may be d/t poor HIV control (see above) or myelosuppression. Consider Heme/onc consult if no improvement.  #Transaminitis - Today, AST 124, ALT 126, TP 6.4, albumin 2.2, and Alk phos of 84. AST/ALT significantly improved from day previous. Transaminitis may have been 2/2 azithromycin use for HCAP. D/c'ed Azithromycin on 08/01/13. - Hepatitis serologies all negative. - Continue to follow CMET. - Avoiding medications with tylenol.  #Electrolyte abnormalities- BMP improved since starting HD. We are limited in terms of IVF treatment options given ESRD. First session of HD 08/01/13. HD performed yesterday. K+ 4.0 this AM.  #Supratherapeutic INR - 1.32 this AM, much improved. - Holding coumadin still at this point. Repeat coags tomorrow in AM.  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 BUN 45. Still making urine. First session of HD 08/01/13, patient tolerated well. Had HD yesterday AM. Patient has worsening LE edema  still, most likely contributed to by low albumin and decreased oncotic pressure.  #HTN - BP stable. 121/83 this PM. - Holding home norvasc, start if BP elevated in AM.  #HIV - Seen by ID yesterday and today. HIV regimen changed; Switched AZT to abacavir. Now on abacavir, efavirens, and lamivudine. - Sent for CD4 count and viral load, now 250 and undetectable, respectively. If no improvement w/ new therapy or negative culture findings after bronchoscopy, ID suggested heme/onc consult for bone marrow biopsy.  Dispo: Disposition is deferred at this time, awaiting improvement of current medical problems.  Anticipated discharge in approximately 2 day(s).   The patient does have a current PCP [name], MD) and does need an [facility] hospital follow-up appointment after discharge.  The patient does not have transportation limitations that hinder transportation to clinic appointments.  .Services Needed at time of discharge: Y = Yes, Blank = No PT:   OT:   RN:   Equipment:   Other:     LOS: 7 days   Luanne Bras, MD 08/05/2013, 6:56 AM Pager: [phone]

## 2013-08-05 NOTE — Progress Notes (Signed)
[facility]    Subjective: C/o scrotal edema and irritation   Antibiotics:  Anti-infectives   Start     Dose/Rate Route Frequency Ordered Stop   08/04/13 1930  abacavir (ZIAGEN) tablet 600 mg     600 mg Oral Daily 08/04/13 1819     08/04/13 1200  vancomycin (VANCOCIN) 1,250 mg in sodium chloride 0.9 % 250 mL IVPB     1,250 mg 166.7 mL/hr over 90 Minutes Intravenous  Once 08/04/13 1111 08/04/13 1424   08/02/13 0845  vancomycin (VANCOCIN) 500 mg in sodium chloride 0.9 % 100 mL IVPB     500 mg 100 mL/hr over 60 Minutes Intravenous Once in dialysis 08/02/13 0830 08/02/13 1726   08/01/13 2000  lamiVUDine (EPIVIR) 10 MG/ML solution 50 mg     50 mg Oral Daily 08/01/13 0935     08/01/13 1430  vancomycin (VANCOCIN) 500 mg in sodium chloride 0.9 % 100 mL IVPB     500 mg 100 mL/hr over 60 Minutes Intravenous  Once 08/01/13 1323 08/01/13 1630   07/30/13 1830  vancomycin (VANCOCIN) 1,750 mg in sodium chloride 0.9 % 500 mL IVPB     1,750 mg 250 mL/hr over 120 Minutes Intravenous  Once 07/30/13 1718 07/30/13 2237   07/30/13 1800  ceFEPIme (MAXIPIME) 500 mg in dextrose 5 % 50 mL IVPB  Status:  Discontinued     500 mg 100 mL/hr over 30 Minutes Intravenous Every 24 hours 07/30/13 1718 08/05/13 0658   07/30/13 0117  zidovudine (RETROVIR) capsule 300 mg  Status:  Discontinued     300 mg Oral Daily 07/29/13 2155 08/04/13 1819   07/30/13 0117  lamiVUDine (EPIVIR) 10 MG/ML solution 100 mg  Status:  Discontinued     100 mg Oral Daily 07/29/13 2201 08/01/13 0935   07/29/13 2300  cefTRIAXone (ROCEPHIN) 1 g in dextrose 5 % 50 mL IVPB  Status:  Discontinued     1 g 100 mL/hr over 30 Minutes Intravenous Every 24 hours 07/29/13 2141 07/30/13 1626   07/29/13 2300  azithromycin (ZITHROMAX) tablet 500 mg  Status:  Discontinued     500 mg Oral Every 24 hours 07/29/13 2141 08/02/13 1631   07/29/13 2300  efavirenz (SUSTIVA) tablet 600 mg     600 mg Oral Daily at bedtime 07/29/13  2152     07/29/13 2200  zidovudine (RETROVIR) tablet 300 mg  Status:  Discontinued     300 mg Oral Daily 07/29/13 2152 07/29/13 2153   07/29/13 2200  lamivudine (EPIVIR) tablet 100 mg  Status:  Discontinued     100 mg Oral Daily 07/29/13 2152 07/29/13 2158      Medications: Scheduled Meds: . abacavir  600 mg Oral Daily  . calcium acetate  1,334 mg Oral TID WC  . [START ON 08/09/2013] darbepoetin (ARANESP) injection - DIALYSIS  200 mcg Intravenous Q Mon-HD  . efavirenz  600 mg Oral QHS  . ferric gluconate (FERRLECIT/NULECIT) IV  125 mg Intravenous Daily  . lamiVUDine  50 mg Oral Daily  . multivitamin  1 tablet Oral Daily  . nicotine  14 mg Transdermal QPC supper  . polyethylene glycol  17 g Oral Daily   Continuous Infusions:  PRN Meds:.sodium chloride, sodium chloride, sodium chloride, sodium chloride, albuterol, feeding supplement (NEPRO CARB STEADY), fentaNYL, heparin, heparin, ibuprofen, lidocaine (PF), lidocaine (PF), lidocaine, lidocaine-prilocaine, midazolam, ondansetron (ZOFRAN) IV, ondansetron, oxyCODONE, pentafluoroprop-tetrafluoroeth, phenylephrine   Objective: Weight change: 24 lb 14.6 oz (11.299 kg)  Intake/Output Summary (Last 24 hours) at 08/05/13 1142 Last data filed at 08/05/13 0600  Gross per 24 hour  Intake    400 ml  Output   1893 ml  Net  -1493 ml   Blood pressure 117/84, pulse 104, temperature 97.8 F (36.6 C), temperature source Oral, resp. rate 18, height 6\' 4"  (1.93 m), weight 200 lb (90.719 kg), SpO2 96.00%. Temp:  [97.7 F (36.5 C)-98.2 F (36.8 C)] 97.8 F (36.6 C) (08/21 1009) Pulse Rate:  [103-107] 104 (08/21 1009) Resp:  [12-47] 18 (08/21 1009) BP: (103-229)/(60-92) 117/84 mmHg (08/21 1009) SpO2:  [92 %-100 %] 96 % (08/21 1009) Weight:  [200 lb (90.719 kg)] 200 lb (90.719 kg) (08/20 2131)  Physical Exam: General: Alert and awake, oriented x3, dysphoric  HEENT: anicteric sclera, pupils reactive to light and accommodation, EOMI, oropharynx  clear and without exudate, very poor dentition  CVS regular rate, normal r, no murmur rubs or gallops  Chest: Rhonchorous air sounds throughout diminished at the bases  Abdomen: soft nontender, nondistended, normal bowel sounds,  Extremities: no clubbing or edema noted bilaterally  Skin:scrotal edema with irritation  Neuro: nonfocal, strength and sensation intact   Lab Results:  Recent Labs  08/04/13 0826 08/05/13 1105  WBC 2.9* 3.4*  HGB 6.6* 8.4*  HCT 18.3* 23.2*  PLT 79* 95*    BMET  Recent Labs  08/03/13 1914 08/04/13 0828  NA 131* 127*  K 3.4* 3.7  CL 91* 90*  CO2 23 19  GLUCOSE 148* 110*  BUN 66* 64*  CREATININE 6.09* 6.49*  CALCIUM 7.5* 7.7*    Micro Results: Recent Results (from the past 240 hour(s))  CULTURE, BLOOD (SINGLE)     Status: None   Collection Time    07/29/13  2:18 PM      Result Value Range Status   Organism ID, Bacteria NO GROWTH 5 DAYS   Final  MRSA PCR SCREENING     Status: None   Collection Time    07/29/13  6:15 PM      Result Value Range Status   MRSA by PCR NEGATIVE  NEGATIVE Final   Comment:            The GeneXpert MRSA Assay (FDA     approved for NASAL specimens     only), is one component of a     comprehensive MRSA colonization     surveillance program. It is not     intended to diagnose MRSA     infection nor to guide or     monitor treatment for     MRSA infections.  CULTURE, BLOOD (ROUTINE X 2)     Status: None   Collection Time    07/29/13 11:10 PM      Result Value Range Status   Specimen Description BLOOD LEFT ARM   Final   Special Requests BOTTLES DRAWN AEROBIC AND ANAEROBIC 10CC   Final   Culture  Setup Time     Final   Value: 07/30/2013 06:02     Performed at [facility]   Culture     Final   Value: NO GROWTH 5 DAYS     Performed at [facility]   Report Status 08/05/2013 FINAL   Final  CULTURE, BLOOD (ROUTINE X 2)     Status: None   Collection Time    07/29/13 11:16 PM      Result  Value Range Status   Specimen Description BLOOD LEFT HAND  Final   Special Requests BOTTLES DRAWN AEROBIC AND ANAEROBIC 5CC   Final   Culture  Setup Time     Final   Value: 07/30/2013 06:02     Performed at [facility]   Culture     Final   Value: NO GROWTH 5 DAYS     Performed at [facility]   Report Status 08/05/2013 FINAL   Final  CULTURE, EXPECTORATED SPUTUM-ASSESSMENT     Status: None   Collection Time    07/30/13 12:45 AM      Result Value Range Status   Specimen Description SPUTUM   Final   Special Requests NONE   Final   Sputum evaluation     Final   Value: MICROSCOPIC FINDINGS SUGGEST THAT THIS SPECIMEN IS NOT REPRESENTATIVE OF LOWER RESPIRATORY SECRETIONS. PLEASE RECOLLECT.     CALLED TO MOORE,K RN 07/30/2013 0347 JORDANS   Report Status 07/30/2013 FINAL   Final  CULTURE, EXPECTORATED SPUTUM-ASSESSMENT     Status: None   Collection Time    07/30/13  3:08 PM      Result Value Range Status   Specimen Description SPUTUM   Final   Special Requests NONE   Final   Sputum evaluation     Final   Value: MICROSCOPIC FINDINGS SUGGEST THAT THIS SPECIMEN IS NOT REPRESENTATIVE OF LOWER RESPIRATORY SECRETIONS. PLEASE RECOLLECT.     NOTIFIED A TONEY 1605 07/30/13 A BROWNING   Report Status 07/30/2013 FINAL   Final  AFB CULTURE WITH SMEAR     Status: None   Collection Time    07/30/13  3:08 PM      Result Value Range Status   Specimen Description SPUTUM   Final   Special Requests NONE   Final   ACID FAST SMEAR     Final   Value: NO ACID FAST BACILLI SEEN     Performed at [facility]   Culture     Final   Value: CULTURE WILL BE EXAMINED FOR 6 WEEKS BEFORE ISSUING A FINAL REPORT     Performed at [facility]   Report Status PENDING   Incomplete  CULTURE, EXPECTORATED SPUTUM-ASSESSMENT     Status: None   Collection Time    07/30/13  8:41 PM      Result Value Range Status   Specimen Description SPUTUM   Final   Special Requests NONE   Final    Sputum evaluation     Final   Value: MICROSCOPIC FINDINGS SUGGEST THAT THIS SPECIMEN IS NOT REPRESENTATIVE OF LOWER RESPIRATORY SECRETIONS. PLEASE RECOLLECT.     Osborne Casco RN [id number] 07/30/13 A BROWNING   Report Status 07/30/2013 FINAL   Final  AFB CULTURE WITH SMEAR     Status: None   Collection Time    07/30/13  8:41 PM      Result Value Range Status   Specimen Description SPUTUM   Final   Special Requests Immunocompromised   Final   ACID FAST SMEAR     Final   Value: NO ACID FAST BACILLI SEEN     Performed at [facility]   Culture     Final   Value: CULTURE WILL BE EXAMINED FOR 6 WEEKS BEFORE ISSUING A FINAL REPORT     Performed at [facility]   Report Status PENDING   Incomplete  AFB CULTURE WITH SMEAR     Status: None   Collection Time    07/31/13  2:44 PM      Result Value Range Status   Specimen Description SPU   Final   Special Requests Immunocompromised   Final   ACID FAST SMEAR     Final   Value: NO ACID FAST BACILLI SEEN     Performed at [facility]   Culture     Final   Value: CULTURE WILL BE EXAMINED FOR 6 WEEKS BEFORE ISSUING A FINAL REPORT     Performed at [facility]   Report Status PENDING   Incomplete  CULTURE, RESPIRATORY (NON-EXPECTORATED)     Status: None   Collection Time    07/31/13  2:44 PM      Result Value Range Status   Specimen Description SPUTUM   Final   Special Requests NONE   Final   Gram Stain     Final   Value: RARE WBC PRESENT, PREDOMINANTLY PMN     FEW SQUAMOUS EPITHELIAL CELLS PRESENT     FEW GRAM POSITIVE COCCI IN CLUSTERS     FEW GRAM NEGATIVE RODS     Performed at [facility]   Culture     Final   Value: NORMAL OROPHARYNGEAL FLORA     Performed at [facility]   Report Status 08/03/2013 FINAL   Final    Studies/Results: Ct Abdomen Pelvis Wo Contrast  08/03/2013   *RADIOLOGY REPORT*  Clinical Data:  Bilateral lower extremity swelling.  End-stage renal disease and  polycystic kidney disease.  HIV.  CT CHEST, ABDOMEN AND PELVIS WITHOUT CONTRAST  Technique:  Multidetector CT imaging of the chest, abdomen and pelvis was performed following the standard protocol without IV contrast.  Comparison:  05/19/2013.  03/22/2012.  01/22/2011.  CT CHEST  Findings:  There is asymmetric diffuse ground-glass attenuation in the lungs.  In this patient with HIV, atypical infection merits consideration.  Low attenuation of the intravascular compartment is present suggesting anemia.  There is more focal consolidation in the medial right lower lobe.  Aspiration is considered unlikely. Cardiomegaly is present, possibly associated with the anemia. Small right pleural effusion.  No aggressive osseous lesions. Enlarged mediastinal lymph nodes are present, which may be congestive or reactive.  There is no axillary adenopathy.  IMPRESSION:  1.  Diffuse geographic ground-glass attenuation in the lungs with areas of sparing.  The differential considerations include infection including atypical agents in this patient with immunosuppression and asymmetric/atypical pulmonary edema. 2.  Anemia and cardiomegaly. 3.  Small right pleural effusion.  CT ABDOMEN AND PELVIS  Findings:  Polycystic kidney disease is present, with associated hepatic cyst.  Small amount of ascites is present.  The kidneys are massively enlarged and displays the other viscera anteriorly. There is no evidence of bowel obstruction allowing for noncontrast technique.  High attenuation in the right lower quadrant appears to reside within the wall of one of the renal cysts.  Small amount of ascites is present in the anatomic pelvis. Anasarca changes in the soft tissues.  No intra-abdominal free air. Chronic L2 compression fracture.  IMPRESSION:  1.  Polycystic kidney disease with associated renal cysts. 2.  The kidneys are massively enlarged and displays the other abdominal viscera.  Cysts are of varying density, with some simple low  attenuation cyst, either hemorrhagic and some calcified. 3.  Small volume of ascites. 4.  No intra-abdominal or retroperitoneal hemorrhage identified.  5.  Anasarca.   Original Report Authenticated By: Dereck Ligas, M.D.   Ct Chest Wo Contrast  08/03/2013   *  RADIOLOGY REPORT*  Clinical Data:  Bilateral lower extremity swelling.  End-stage renal disease and polycystic kidney disease.  HIV.  CT CHEST, ABDOMEN AND PELVIS WITHOUT CONTRAST  Technique:  Multidetector CT imaging of the chest, abdomen and pelvis was performed following the standard protocol without IV contrast.  Comparison:  05/19/2013.  03/22/2012.  01/22/2011.  CT CHEST  Findings:  There is asymmetric diffuse ground-glass attenuation in the lungs.  In this patient with HIV, atypical infection merits consideration.  Low attenuation of the intravascular compartment is present suggesting anemia.  There is more focal consolidation in the medial right lower lobe.  Aspiration is considered unlikely. Cardiomegaly is present, possibly associated with the anemia. Small right pleural effusion.  No aggressive osseous lesions. Enlarged mediastinal lymph nodes are present, which may be congestive or reactive.  There is no axillary adenopathy.  IMPRESSION:  1.  Diffuse geographic ground-glass attenuation in the lungs with areas of sparing.  The differential considerations include infection including atypical agents in this patient with immunosuppression and asymmetric/atypical pulmonary edema. 2.  Anemia and cardiomegaly. 3.  Small right pleural effusion.  CT ABDOMEN AND PELVIS  Findings:  Polycystic kidney disease is present, with associated hepatic cyst.  Small amount of ascites is present.  The kidneys are massively enlarged and displays the other viscera anteriorly. There is no evidence of bowel obstruction allowing for noncontrast technique.  High attenuation in the right lower quadrant appears to reside within the wall of one of the renal cysts.  Small  amount of ascites is present in the anatomic pelvis. Anasarca changes in the soft tissues.  No intra-abdominal free air. Chronic L2 compression fracture.  IMPRESSION:  1.  Polycystic kidney disease with associated renal cysts. 2.  The kidneys are massively enlarged and displays the other abdominal viscera.  Cysts are of varying density, with some simple low attenuation cyst, either hemorrhagic and some calcified. 3.  Small volume of ascites. 4.  No intra-abdominal or retroperitoneal hemorrhage identified.  5.  Anasarca.   Original Report Authenticated By: Dereck Ligas, M.D.      Assessment/Plan: Brian [name] is a 37 y.o. 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 He is sp bronchoscopy by Dr. Elsworth Soho and GREATLY appreciate his help here   #1 Non-resolving pneumonia HIV positive patient: His CD4 is still 250 despite his leukopenia etc. 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 CD4 is still 250 and % has actually increased suggestng that he really is suffering frmo effect on his bone marrow from another source rather than CD4 specific depletion  --fu His HIV RNA   #4 Scrotal edema: secondary to third spaciing, fine to give him topical nystatin   LOS: 7 days   Alcide Evener 08/05/2013, 11:42 AM

## 2013-08-05 NOTE — Progress Notes (Signed)
Internal Medicine Attending  Date: 08/05/2013  Patient name: Brian [name] Medical record number: [id number] Date of birth: 1976/11/02 Age: 37 y.o. Gender: male  I saw and evaluated the patient, and discussed his care on A.M rounds with housestaff.  I reviewed the resident's note by Dr. Ronnald Ramp and I agree with the resident's findings and plans as documented in his note.

## 2013-08-05 NOTE — Progress Notes (Signed)
Pt still with hemoptysis. ID on board. Please call us if we can be of further help.

## 2013-08-05 NOTE — Op Note (Signed)
Indication: Bilateral unexplained pulmonary infiltrates in the 37 -year-old with HIV & hemoptysis  Written informed consent was obtained from the patient prior to the procedure. The risks of the procedure including coughing, bleeding and a small chance of lung cancer requiring a chest tube were discussed with the patient in great detail and evidenced understanding.  4 mg of Versed and  100 mcg of fentanyl were used in divided doses during the procedure. Bronchoscope was inserted from the right Nare. Some bleeding noted prior to entry of scope in post rt nare. The scope was then inserted via the mouth. The upper airway appeared normal. Vocal cord showed normal appearance in motion. The trachea bronchial tree was then examined to the subsegmental level. Minimal secretions were noted. No endobronchial lesions were noted. Minimal aspirated blood notde in the RML which could be washed off.  Attention was then turned to the right lower lobe. Bronchoalveolar lavage was obtained from the right lower lobe with good return. The patient tolerated procedure well with nobleeding.   He was awake and alert in the end of the procedure.  Kara Mead MD. Shade Flood. [facility] Pager [phone] If no response call [phone]

## 2013-08-05 NOTE — Progress Notes (Signed)
[facility] Progress Note  Subjective:   Had bronch this AM- c/o testicle and penis swelling with a little erythema- able to remove 1900 with HD yest- AVF holding up pretty well  Objective Filed Vitals:   08/05/13 0825 08/05/13 0830 08/05/13 0845 08/05/13 0903  BP: 213/83 224/89 182/91 160/81  Pulse:      Temp:      TempSrc:      Resp: 16 31 20 18   Height:      Weight:      SpO2: 97% 94% 94% 95%   Physical Exam  Goal 2.5 General: ill appearing, sallow Heart: tachy reg Lungs:  No wheezes or rales; dec BS Abdomen: distended generalized tenderness + BS Extremities:  Feet elevated 1 + edema; some bruising on legs Dialysis Access right upper AVF    Assessment/Plan:  1. Cough/Fever- afebrile. Now s/p bronch and ID on board. AFB smear negative times several- continuing neg pressure room for now.  Sputum cultures pending.   Quantiferon TB test pending . Chest CT 8/19 showed: ".  Diffuse geographic ground-glass attenuation in the lungs with areas of sparing.  The differential considerations include infection including atypical agents in this patient with immunosuppression and asymmetric/atypical pulmonary edema." Continuing to titrate volume down with  dialysis. 2. ESRD - to be MWF at [facility].  first HD  8/17, 1 L off. Second 8/18 again one liter off secondary to soft BP- tolerated well. Third HD tmt 8/20- will plan for fourth treatment tomorrow.   Of note, also has been sent to [facility] for possibility of transplant but financially he is not able to swing it so has taken himself out of consideration for now.  3. Anemia - hgb low s/p transfusion 8/14 and 8/20.  (HIV?) received Aranesp 100 last week - dose increased to [id number],  continue ferrlicit ([id number])- agree- also Gi deferred invasive procedures.  Continue to follow- gave parameters for transfusion tomorrow if hgb is less than 7 4. Secondary hyperparathyroidism - Ca+ 7.5, last phos 7.6 . Have added phoslo Last PTH was 262, no vitamin D as  of yet  5. HTN/volume - soft BP but will continue to challenge as able.  Srotal edema likely part of all of this, also antifungal powder per ID 6. Nutrition - alb 2.4 on regular diet. dialysis should help  7. HIV- continue current therapy  8. Pancytopenia - WBC 6.1 on admission down to 2.9; platelets 104 down to 70; Hgb 6.6 ? Related to HIV 9. Transaminitis - trending down; serologies negative; follow 10.  Elevated INR - trending down - not on coumadin- per primary team 11.  Disp - outpt HD arranged MWF2 at [facility] on [facility]; from a renal perspective he is cleared for d/c  Loys Hoselton A  08/05/2013,9:54 AM  LOS: 7 days      Additional Objective Labs: Lab Results  Component Value Date   INR 1.33 08/04/2013   INR 1.52* 08/03/2013   INR 2.27* [id number]    Basic Metabolic Panel:  Recent Labs Lab 08/02/13 1056 08/03/13 1914 08/04/13 0828  NA 127* 131* 127*  K 3.2* 3.4* 3.7  CL 86* 91* 90*  CO2 23 23 19   GLUCOSE 127* 148* 110*  BUN 94* 66* 64*  CREATININE 8.74* 6.09* 6.49*  CALCIUM 7.5* 7.5* 7.7*  PHOS 7.6*  --  5.3*   Liver Function Tests:  Recent Labs Lab 08/01/13 0500  08/02/13 1147 08/03/13 1914 08/04/13 0828  AST 430*  --  230* 163*  --  ALT 258*  --  192* 149*  --   ALKPHOS 57  --  74 86  --   BILITOT 0.4  --  0.4 0.3  --   PROT 5.8*  --  5.7* 5.8*  --   ALBUMIN 2.4*  < > 2.1* 2.0* 2.0*  < > = values in this interval not displayed.CBC:  Recent Labs Lab 07/29/13 1418  08/01/13 0500 08/02/13 1056 08/02/13 2235 08/03/13 0905 08/03/13 1321 08/04/13 0826  WBC 6.1  < > 4.1 3.1* 2.5* 3.2* 3.5* 2.9*  NEUTROABS 5.1  --  3.7  --   --   --   --  2.3  HGB 6.8*  < > 7.5* 6.5* 7.1* 7.4* 7.9* 6.6*  HCT 19.8*  < > 20.8* 17.9* 19.4* 20.5* 21.5* 18.3*  MCV 108.8*  < > 101.0* 101.1* 99.5 100.0 98.6 99.5  PLT 104*  < > 99* 84* 82* 82* 91* 79*  < > = values in this interval not displayed. Blood Culture    Component Value Date/Time   SDES SPU 07/31/2013 1444    SDES SPUTUM 07/31/2013 1444   SPECREQUEST Immunocompromised 07/31/2013 1444   SPECREQUEST NONE 07/31/2013 1444   CULT  Value: CULTURE WILL BE EXAMINED FOR 6 WEEKS BEFORE ISSUING A FINAL REPORT Performed at [facility] 07/31/2013 1444   CULT  Value: NORMAL OROPHARYNGEAL FLORA Performed at [place] 07/31/2013 1444   REPTSTATUS PENDING 07/31/2013 1444   REPTSTATUS 08/03/2013 FINAL 07/31/2013 1444  Studies/Results: Ct Abdomen Pelvis Wo Contrast  08/03/2013   *RADIOLOGY REPORT*  Clinical Data:  Bilateral lower extremity swelling.  End-stage renal disease and polycystic kidney disease.  HIV.  CT CHEST, ABDOMEN AND PELVIS WITHOUT CONTRAST  Technique:  Multidetector CT imaging of the chest, abdomen and pelvis was performed following the standard protocol without IV contrast.  Comparison:  05/19/2013.  03/22/2012.  01/22/2011.  CT CHEST  Findings:  There is asymmetric diffuse ground-glass attenuation in the lungs.  In this patient with HIV, atypical infection merits consideration.  Low attenuation of the intravascular compartment is present suggesting anemia.  There is more focal consolidation in the medial right lower lobe.  Aspiration is considered unlikely. Cardiomegaly is present, possibly associated with the anemia. Small right pleural effusion.  No aggressive osseous lesions. Enlarged mediastinal lymph nodes are present, which may be congestive or reactive.  There is no axillary adenopathy.  IMPRESSION:  1.  Diffuse geographic ground-glass attenuation in the lungs with areas of sparing.  The differential considerations include infection including atypical agents in this patient with immunosuppression and asymmetric/atypical pulmonary edema. 2.  Anemia and cardiomegaly. 3.  Small right pleural effusion.  CT ABDOMEN AND PELVIS  Findings:  Polycystic kidney disease is present, with associated hepatic cyst.  Small amount of ascites is present.  The kidneys are massively enlarged and displays the  other viscera anteriorly. There is no evidence of bowel obstruction allowing for noncontrast technique.  High attenuation in the right lower quadrant appears to reside within the wall of one of the renal cysts.  Small amount of ascites is present in the anatomic pelvis. Anasarca changes in the soft tissues.  No intra-abdominal free air. Chronic L2 compression fracture.  IMPRESSION:  1.  Polycystic kidney disease with associated renal cysts. 2.  The kidneys are massively enlarged and displays the other abdominal viscera.  Cysts are of varying density, with some simple low attenuation cyst, either hemorrhagic and some calcified. 3.  Small volume of ascites. 4.  No intra-abdominal or retroperitoneal hemorrhage identified.  5.  Anasarca.   Original Report Authenticated By: Dereck Ligas, M.D.   Medications:   . abacavir  600 mg Oral Daily  . calcium acetate  1,334 mg Oral TID WC  . [START ON 08/09/2013] darbepoetin (ARANESP) injection - DIALYSIS  200 mcg Intravenous Q Mon-HD  . efavirenz  600 mg Oral QHS  . ferric gluconate (FERRLECIT/NULECIT) IV  125 mg Intravenous Daily  . lamiVUDine  50 mg Oral Daily  . multivitamin  1 tablet Oral Daily  . nicotine  14 mg Transdermal QPC supper  . polyethylene glycol  17 g Oral Daily

## 2013-08-05 NOTE — Progress Notes (Signed)
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   Pt cont to have low grade fevers, malaise, hemoptysis despite rx x 6 days and PCCM consulted.  Coumadin was stopped & INR 3.8 on adm has normalised but hemoptysis continues. He has been started on HD now for end stage polycystic kidney disease  SIGNIFICANT EVENTS / STUDIES:  CT chest 8/19>>> 1. Diffuse geographic ground-glass attenuation in the lungs with areas of sparing. The differential considerations include infection including atypical agents in this patient with immunosuppression and asymmetric/atypical pulmonary edema. 2. Anemia and cardiomegaly. 3. Small right pleural effusion.  CT abd/pelvis 8/19>>> 1. Polycystic kidney disease with associated renal cysts. 2. The kidneys are massively enlarged and displays the other abdominal viscera. Cysts are of varying density, with some simple low attenuation cyst, either hemorrhagic and some calcified. 3. Small volume of ascites. 4. No intra-abdominal or retroperitoneal hemorrhage identified. 5. Anasarca.  LINES / TUBES: none  CULTURES:   ANTIBIOTICS: Cefepime 8/14 >. vanc azithro   SUBJECTIVE:  Did not sleep well Denies CP, dyspnea Cough persists Blood tinged sputum  VITAL SIGNS: Temp:  [97.7 F (36.5 C)-98.8 F (37.1 C)] 97.7 F (36.5 C) (08/21 0436) Pulse Rate:  [102-110] 107 (08/21 0436) Resp:  [12-47] 31 (08/21 0830) BP: (103-229)/(60-92) 224/89 mmHg (08/21 0830) SpO2:  [89 %-100 %] 94 % (08/21 0830) Weight:  [90.719 kg (200 lb)] 90.719 kg (200 lb) (08/20  2131)  PHYSICAL EXAMINATION: Gen. Pleasant, well-nourished, in no distress, normal affect, eamined on HD ENT - no lesions, no post nasal drip Neck: No JVD, no thyromegaly, no carotid bruits Lungs: no use of accessory muscles, no dullness to percussion, RLL rales , no rhonchi  Cardiovascular: Rhythm regular, heart sounds  normal, no murmurs, no peripheral edema Abdomen: soft and non-tender, no hepatosplenomegaly, BS normal. Musculoskeletal: No deformities, no cyanosis or clubbing Neuro:  alert, non focal Skin:  Warm, no lesions/ rash    Recent Labs Lab 08/02/13 1056 08/03/13 1914 08/04/13 0828  NA 127* 131* 127*  K 3.2* 3.4* 3.7  CL 86* 91* 90*  CO2 23 23 19   BUN 94* 66* 64*  CREATININE 8.74* 6.09* 6.49*  GLUCOSE 127* 148* 110*    Recent Labs Lab 08/03/13 0905 08/03/13 1321 08/04/13 0826  HGB 7.4* 7.9* 6.6*  HCT 20.5* 21.5* 18.3*  WBC 3.2* 3.5* 2.9*  PLT 82* 91* 79*   Ct Abdomen Pelvis Wo Contrast  08/03/2013   *RADIOLOGY REPORT*  Clinical Data:  Bilateral lower extremity swelling.  End-stage renal disease and polycystic kidney disease.  HIV.  CT CHEST, ABDOMEN AND PELVIS WITHOUT CONTRAST  Technique:  Multidetector CT imaging of the chest, abdomen and pelvis was performed following the standard protocol without IV contrast.  Comparison:  05/19/2013.  03/22/2012.  01/22/2011.  CT CHEST  Findings:  There is asymmetric diffuse ground-glass attenuation in the lungs.  In this patient with HIV, atypical infection merits consideration.  Low attenuation of the intravascular compartment is present suggesting anemia.  There is more focal consolidation in the medial right lower lobe.  Aspiration is  considered unlikely. Cardiomegaly is present, possibly associated with the anemia. Small right pleural effusion.  No aggressive osseous lesions. Enlarged mediastinal lymph nodes are present, which may be congestive or reactive.  There is no axillary adenopathy.  IMPRESSION:  1.  Diffuse  geographic ground-glass attenuation in the lungs with areas of sparing.  The differential considerations include infection including atypical agents in this patient with immunosuppression and asymmetric/atypical pulmonary edema. 2.  Anemia and cardiomegaly. 3.  Small right pleural effusion.  CT ABDOMEN AND PELVIS  Findings:  Polycystic kidney disease is present, with associated hepatic cyst.  Small amount of ascites is present.  The kidneys are massively enlarged and displays the other viscera anteriorly. There is no evidence of bowel obstruction allowing for noncontrast technique.  High attenuation in the right lower quadrant appears to reside within the wall of one of the renal cysts.  Small amount of ascites is present in the anatomic pelvis. Anasarca changes in the soft tissues.  No intra-abdominal free air. Chronic L2 compression fracture.  IMPRESSION:  1.  Polycystic kidney disease with associated renal cysts. 2.  The kidneys are massively enlarged and displays the other abdominal viscera.  Cysts are of varying density, with some simple low attenuation cyst, either hemorrhagic and some calcified. 3.  Small volume of ascites. 4.  No intra-abdominal or retroperitoneal hemorrhage identified.  5.  Anasarca.   Original Report Authenticated By: Dereck Ligas, M.D.   Ct Chest Wo Contrast  08/03/2013   *RADIOLOGY REPORT*  Clinical Data:  Bilateral lower extremity swelling.  End-stage renal disease and polycystic kidney disease.  HIV.  CT CHEST, ABDOMEN AND PELVIS WITHOUT CONTRAST  Technique:  Multidetector CT imaging of the chest, abdomen and pelvis was performed following the standard protocol without IV contrast.  Comparison:  05/19/2013.  03/22/2012.  01/22/2011.  CT CHEST  Findings:  There is asymmetric diffuse ground-glass attenuation in the lungs.  In this patient with HIV, atypical infection merits consideration.  Low attenuation of the intravascular compartment is present suggesting anemia.  There is more  focal consolidation in the medial right lower lobe.  Aspiration is considered unlikely. Cardiomegaly is present, possibly associated with the anemia. Small right pleural effusion.  No aggressive osseous lesions. Enlarged mediastinal lymph nodes are present, which may be congestive or reactive.  There is no axillary adenopathy.  IMPRESSION:  1.  Diffuse geographic ground-glass attenuation in the lungs with areas of sparing.  The differential considerations include infection including atypical agents in this patient with immunosuppression and asymmetric/atypical pulmonary edema. 2.  Anemia and cardiomegaly. 3.  Small right pleural effusion.  CT ABDOMEN AND PELVIS  Findings:  Polycystic kidney disease is present, with associated hepatic cyst.  Small amount of ascites is present.  The kidneys are massively enlarged and displays the other viscera anteriorly. There is no evidence of bowel obstruction allowing for noncontrast technique.  High attenuation in the right lower quadrant appears to reside within the wall of one of the renal cysts.  Small amount of ascites is present in the anatomic pelvis. Anasarca changes in the soft tissues.  No intra-abdominal free air. Chronic L2 compression fracture.  IMPRESSION:  1.  Polycystic kidney disease with associated renal cysts. 2.  The kidneys are massively enlarged and displays the other abdominal viscera.  Cysts are of varying density, with some simple low attenuation cyst, either hemorrhagic and some calcified. 3.  Small volume of ascites. 4.  No intra-abdominal or retroperitoneal hemorrhage identified.  5.  Anasarca.  Original Report Authenticated By: Dereck Ligas, M.D.    ASSESSMENT / PLAN:  HCAP - recent hospitalisation in 6/14 Hemoptysis -INR resolved, off coumadin Blood culture no growth so far. Legionella antigen and strep pneumo antigen -ve.  Sp afb neg x 3  PLAN - Inspection bronchoscopy showed blood in post nare & not much in lower airways- this could be  epistaxis in presence of coumadin - if recurs would suggest ENt eval BAL sent for cx, afb, PCP & viral resp panel -but doubt infectious etiology CT findings could be aspirated blood Will follow   H/o DVT/PE - coumadin on hold for now  BP high pre/post procedure -due to cuff in leg? OK to resume diet in 2h  ALVA,RAKESH V.  [id number]

## 2013-08-06 ENCOUNTER — Encounter (HOSPITAL_COMMUNITY): Payer: Self-pay | Admitting: Pulmonary Disease

## 2013-08-06 DIAGNOSIS — D696 Thrombocytopenia, unspecified: Secondary | ICD-10-CM

## 2013-08-06 DIAGNOSIS — N5089 Other specified disorders of the male genital organs: Secondary | ICD-10-CM

## 2013-08-06 LAB — CBC
Hemoglobin: 8.6 g/dL — ABNORMAL LOW (ref 13.0–17.0)
MCH: 35.2 pg — ABNORMAL HIGH (ref 26.0–34.0)
MCHC: 35.8 g/dL (ref 30.0–36.0)
MCV: 98.4 fL (ref 78.0–100.0)
Platelets: 86 10*3/uL — ABNORMAL LOW (ref 150–400)

## 2013-08-06 LAB — RENAL FUNCTION PANEL
Calcium: 8.1 mg/dL — ABNORMAL LOW (ref 8.4–10.5)
Creatinine, Ser: 6.14 mg/dL — ABNORMAL HIGH (ref 0.50–1.35)
GFR calc Af Amer: 12 mL/min — ABNORMAL LOW (ref >90)
Glucose, Bld: 143 mg/dL — ABNORMAL HIGH (ref 70–99)
Phosphorus: 4.8 mg/dL — ABNORMAL HIGH (ref 2.3–4.6)
Sodium: 130 mEq/L — ABNORMAL LOW (ref 135–145)

## 2013-08-06 LAB — T-HELPER CELLS (CD4) COUNT (NOT AT ARMC): CD4 T Cell Abs: 300 /uL — ABNORMAL LOW (ref 400–2700)

## 2013-08-06 MED ORDER — HEPARIN SODIUM (PORCINE) 1000 UNIT/ML DIALYSIS
20.0000 [IU]/kg | INTRAMUSCULAR | Status: DC | PRN
  Administered 2013-08-06: 1800 [IU] via INTRAVENOUS_CENTRAL
  Filled 2013-08-06: qty 2

## 2013-08-06 MED ORDER — ZOLPIDEM TARTRATE 5 MG PO TABS
5.0000 mg | ORAL_TABLET | Freq: Every evening | ORAL | Status: DC | PRN
  Administered 2013-08-07: 5 mg via ORAL
  Filled 2013-08-06: qty 1

## 2013-08-06 MED ORDER — OXYCODONE HCL 5 MG PO TABS
ORAL_TABLET | ORAL | Status: AC
  Filled 2013-08-06: qty 2

## 2013-08-06 NOTE — Progress Notes (Signed)
[facility]    Subjective: Was concerned I was keeping him in airborne and in hospital   Antibiotics:  Anti-infectives   Start     Dose/Rate Route Frequency Ordered Stop   08/04/13 1930  abacavir (ZIAGEN) tablet 600 mg     600 mg Oral Daily 08/04/13 1819     08/04/13 1200  vancomycin (VANCOCIN) 1,250 mg in sodium chloride 0.9 % 250 mL IVPB     1,250 mg 166.7 mL/hr over 90 Minutes Intravenous  Once 08/04/13 1111 08/04/13 1424   08/02/13 0845  vancomycin (VANCOCIN) 500 mg in sodium chloride 0.9 % 100 mL IVPB     500 mg 100 mL/hr over 60 Minutes Intravenous Once in dialysis 08/02/13 0830 08/02/13 1726   08/01/13 2000  lamiVUDine (EPIVIR) 10 MG/ML solution 50 mg     50 mg Oral Daily 08/01/13 0935     08/01/13 1430  vancomycin (VANCOCIN) 500 mg in sodium chloride 0.9 % 100 mL IVPB     500 mg 100 mL/hr over 60 Minutes Intravenous  Once 08/01/13 1323 08/01/13 1630   07/30/13 1830  vancomycin (VANCOCIN) 1,750 mg in sodium chloride 0.9 % 500 mL IVPB     1,750 mg 250 mL/hr over 120 Minutes Intravenous  Once 07/30/13 1718 07/30/13 2237   07/30/13 1800  ceFEPIme (MAXIPIME) 500 mg in dextrose 5 % 50 mL IVPB  Status:  Discontinued     500 mg 100 mL/hr over 30 Minutes Intravenous Every 24 hours 07/30/13 1718 08/05/13 0658   07/30/13 0117  zidovudine (RETROVIR) capsule 300 mg  Status:  Discontinued     300 mg Oral Daily 07/29/13 2155 08/04/13 1819   07/30/13 0117  lamiVUDine (EPIVIR) 10 MG/ML solution 100 mg  Status:  Discontinued     100 mg Oral Daily 07/29/13 2201 08/01/13 0935   07/29/13 2300  cefTRIAXone (ROCEPHIN) 1 g in dextrose 5 % 50 mL IVPB  Status:  Discontinued     1 g 100 mL/hr over 30 Minutes Intravenous Every 24 hours 07/29/13 2141 07/30/13 1626   07/29/13 2300  azithromycin (ZITHROMAX) tablet 500 mg  Status:  Discontinued     500 mg Oral Every 24 hours 07/29/13 2141 08/02/13 1631   07/29/13 2300  efavirenz (SUSTIVA) tablet 600 mg     600 mg Oral  Daily at bedtime 07/29/13 2152     07/29/13 2200  zidovudine (RETROVIR) tablet 300 mg  Status:  Discontinued     300 mg Oral Daily 07/29/13 2152 07/29/13 2153   07/29/13 2200  lamivudine (EPIVIR) tablet 100 mg  Status:  Discontinued     100 mg Oral Daily 07/29/13 2152 07/29/13 2158      Medications: Scheduled Meds: . abacavir  600 mg Oral Daily  . calcium acetate  1,334 mg Oral TID WC  . [START ON 08/09/2013] darbepoetin (ARANESP) injection - DIALYSIS  200 mcg Intravenous Q Mon-HD  . efavirenz  600 mg Oral QHS  . ferric gluconate (FERRLECIT/NULECIT) IV  125 mg Intravenous Daily  . lamiVUDine  50 mg Oral Daily  . multivitamin  1 tablet Oral Daily  . nicotine  14 mg Transdermal QPC supper  . nystatin   Topical BID  . oxyCODONE      . polyethylene glycol  17 g Oral Daily   Continuous Infusions:  PRN Meds:.sodium chloride, sodium chloride, sodium chloride, sodium chloride, albuterol, feeding supplement (NEPRO CARB STEADY), fentaNYL, heparin, heparin, ibuprofen, lidocaine (PF), lidocaine (PF),  lidocaine, lidocaine-prilocaine, midazolam, ondansetron (ZOFRAN) IV, ondansetron, oxyCODONE, pentafluoroprop-tetrafluoroeth, phenylephrine   Objective: Weight change: -24 lb 6.9 oz (-11.081 kg)  Intake/Output Summary (Last 24 hours) at 08/06/13 1540 Last data filed at 08/06/13 0950  Gross per 24 hour  Intake    720 ml  Output      1 ml  Net    719 ml   Blood pressure 133/80, pulse 110, temperature 97.5 F (36.4 C), temperature source Oral, resp. rate 16, height 6\' 4"  (1.93 m), weight 214 lb 8.1 oz (97.3 kg), SpO2 98.00%. Temp:  [97.3 F (36.3 C)-98.5 F (36.9 C)] 97.5 F (36.4 C) (08/22 1330) Pulse Rate:  [94-110] 110 (08/22 1530) Resp:  [16-20] 16 (08/22 1530) BP: (93-133)/(56-83) 133/80 mmHg (08/22 1530) SpO2:  [96 %-98 %] 98 % (08/22 0950) Weight:  [200 lb (90.719 kg)-214 lb 8.1 oz (97.3 kg)] 214 lb 8.1 oz (97.3 kg) (08/22 1330)  Physical Exam: General: Alert and awake, oriented  x3, dysphoric  HEENT: anicteric sclera, pupils reactive to light and accommodation, EOMI, oropharynx clear and without exudate, very poor dentition  CVS regular rate, normal r, no murmur rubs or gallops  Chest: Rhonchorous air sounds throughout diminished at the bases  Abdomen: soft nontender, nondistended, normal bowel sounds,  Extremities: no clubbing or edema noted bilaterally  Skin:scrotal edema with irritation  Neuro: nonfocal, strength and sensation intact   Lab Results:  Recent Labs  08/05/13 1105 08/06/13 1344  WBC 3.4* 3.3*  HGB 8.4* 8.6*  HCT 23.2* 24.0*  PLT 95* 86*    BMET  Recent Labs  08/05/13 1105 08/06/13 1344  NA 132* 130*  K 4.0 4.0  CL 93* 93*  CO2 25 21  GLUCOSE 126* 143*  BUN 45* 57*  CREATININE 5.09* 6.14*  CALCIUM 8.3* 8.1*    Micro Results: Recent Results (from the past 240 hour(s))  CULTURE, BLOOD (SINGLE)     Status: None   Collection Time    07/29/13  2:18 PM      Result Value Range Status   Organism ID, Bacteria NO GROWTH 5 DAYS   Final  MRSA PCR SCREENING     Status: None   Collection Time    07/29/13  6:15 PM      Result Value Range Status   MRSA by PCR NEGATIVE  NEGATIVE Final   Comment:            The GeneXpert MRSA Assay (FDA     approved for NASAL specimens     only), is one component of a     comprehensive MRSA colonization     surveillance program. It is not     intended to diagnose MRSA     infection nor to guide or     monitor treatment for     MRSA infections.  CULTURE, BLOOD (ROUTINE X 2)     Status: None   Collection Time    07/29/13 11:10 PM      Result Value Range Status   Specimen Description BLOOD LEFT ARM   Final   Special Requests BOTTLES DRAWN AEROBIC AND ANAEROBIC 10CC   Final   Culture  Setup Time     Final   Value: 07/30/2013 06:02     Performed at [facility]   Culture     Final   Value: NO GROWTH 5 DAYS     Performed at [facility]   Report Status 08/05/2013 FINAL   Final  CULTURE, BLOOD (ROUTINE X 2)     Status: None   Collection Time    07/29/13 11:16 PM      Result Value Range Status   Specimen Description BLOOD LEFT HAND   Final   Special Requests BOTTLES DRAWN AEROBIC AND ANAEROBIC 5CC   Final   Culture  Setup Time     Final   Value: 07/30/2013 06:02     Performed at [facility]   Culture     Final   Value: NO GROWTH 5 DAYS     Performed at [facility]   Report Status 08/05/2013 FINAL   Final  CULTURE, EXPECTORATED SPUTUM-ASSESSMENT     Status: None   Collection Time    07/30/13 12:45 AM      Result Value Range Status   Specimen Description SPUTUM   Final   Special Requests NONE   Final   Sputum evaluation     Final   Value: MICROSCOPIC FINDINGS SUGGEST THAT THIS SPECIMEN IS NOT REPRESENTATIVE OF LOWER RESPIRATORY SECRETIONS. PLEASE RECOLLECT.     CALLED TO MOORE,K RN 07/30/2013 0347 JORDANS   Report Status 07/30/2013 FINAL   Final  CULTURE, EXPECTORATED SPUTUM-ASSESSMENT     Status: None   Collection Time    07/30/13  3:08 PM      Result Value Range Status   Specimen Description SPUTUM   Final   Special Requests NONE   Final   Sputum evaluation     Final   Value: MICROSCOPIC FINDINGS SUGGEST THAT THIS SPECIMEN IS NOT REPRESENTATIVE OF LOWER RESPIRATORY SECRETIONS. PLEASE RECOLLECT.     NOTIFIED A TONEY 1605 07/30/13 A BROWNING   Report Status 07/30/2013 FINAL   Final  AFB CULTURE WITH SMEAR     Status: None   Collection Time    07/30/13  3:08 PM      Result Value Range Status   Specimen Description SPUTUM   Final   Special Requests NONE   Final   ACID FAST SMEAR     Final   Value: NO ACID FAST BACILLI SEEN     Performed at [facility]   Culture     Final   Value: CULTURE WILL BE EXAMINED FOR 6 WEEKS BEFORE ISSUING A FINAL REPORT     Performed at [facility]   Report Status PENDING   Incomplete  CULTURE, EXPECTORATED SPUTUM-ASSESSMENT     Status: None   Collection Time    07/30/13  8:41 PM       Result Value Range Status   Specimen Description SPUTUM   Final   Special Requests NONE   Final   Sputum evaluation     Final   Value: MICROSCOPIC FINDINGS SUGGEST THAT THIS SPECIMEN IS NOT REPRESENTATIVE OF LOWER RESPIRATORY SECRETIONS. PLEASE RECOLLECT.     Osborne Casco RN [id number] 07/30/13 A BROWNING   Report Status 07/30/2013 FINAL   Final  AFB CULTURE WITH SMEAR     Status: None   Collection Time    07/30/13  8:41 PM      Result Value Range Status   Specimen Description SPUTUM   Final   Special Requests Immunocompromised   Final   ACID FAST SMEAR     Final   Value: NO ACID FAST BACILLI SEEN     Performed at [facility]   Culture     Final   Value: CULTURE WILL BE EXAMINED FOR 6 WEEKS BEFORE ISSUING A  FINAL REPORT     Performed at [facility]   Report Status PENDING   Incomplete  AFB CULTURE WITH SMEAR     Status: None   Collection Time    07/31/13  2:44 PM      Result Value Range Status   Specimen Description SPU   Final   Special Requests Immunocompromised   Final   ACID FAST SMEAR     Final   Value: NO ACID FAST BACILLI SEEN     Performed at [facility]   Culture     Final   Value: CULTURE WILL BE EXAMINED FOR 6 WEEKS BEFORE ISSUING A FINAL REPORT     Performed at [facility]   Report Status PENDING   Incomplete  CULTURE, RESPIRATORY (NON-EXPECTORATED)     Status: None   Collection Time    07/31/13  2:44 PM      Result Value Range Status   Specimen Description SPUTUM   Final   Special Requests NONE   Final   Gram Stain     Final   Value: RARE WBC PRESENT, PREDOMINANTLY PMN     FEW SQUAMOUS EPITHELIAL CELLS PRESENT     FEW GRAM POSITIVE COCCI IN CLUSTERS     FEW GRAM NEGATIVE RODS     Performed at [facility]   Culture     Final   Value: NORMAL OROPHARYNGEAL FLORA     Performed at [facility]   Report Status 08/03/2013 FINAL   Final  AFB CULTURE WITH SMEAR     Status: None   Collection Time    08/04/13   8:34 AM      Result Value Range Status   Specimen Description SPUTUM   Final   Special Requests NONE   Final   ACID FAST SMEAR     Final   Value: NO ACID FAST BACILLI SEEN     Performed at [facility]   Culture     Final   Value: CULTURE WILL BE EXAMINED FOR 6 WEEKS BEFORE ISSUING A FINAL REPORT     Performed at [facility]   Report Status PENDING   Incomplete  FUNGUS CULTURE W SMEAR     Status: None   Collection Time    08/05/13  8:30 AM      Result Value Range Status   Specimen Description BRONCHIAL WASHINGS   Final   Special Requests NONE   Final   Fungal Smear     Final   Value: NO YEAST OR FUNGAL ELEMENTS SEEN     Performed at [facility]   Culture     Final   Value: CULTURE IN PROGRESS FOR FOUR WEEKS     Performed at [facility]   Report Status PENDING   Incomplete  PNEUMOCYSTIS JIROVECI SMEAR BY DFA     Status: None   Collection Time    08/05/13  8:30 AM      Result Value Range Status   Specimen Source-PJSRC BRONCHIAL WASHINGS   Final   Pneumocystis jiroveci Ag NEGATIVE   Final   Comment: Performed at [facility]  LEGIONELLA CULTURE     Status: None   Collection Time    08/05/13  8:30 AM      Result Value Range Status   Specimen Description BRONCHIAL WASHINGS   Final   Special Requests RRL   Final   Culture  Final   Value: NO LEGIONELLA ISOLATED, CULTURE IN PROGRESS FOR 5 DAYS     Performed at [facility]   Report Status PENDING   Incomplete  CULTURE, RESPIRATORY (NON-EXPECTORATED)     Status: None   Collection Time    08/05/13  8:30 AM      Result Value Range Status   Specimen Description BRONCHIAL WASHINGS RLL   Final   Special Requests NONE   Final   Gram Stain     Final   Value: FEW WBC PRESENT, PREDOMINANTLY PMN     FEW SQUAMOUS EPITHELIAL CELLS PRESENT     MODERATE GRAM POSITIVE COCCI IN PAIRS     IN CLUSTERS MODERATE GRAM NEGATIVE RODS     Performed at [facility]   Culture      Final   Value: Non-Pathogenic Oropharyngeal-type Flora Isolated.     Performed at [facility]   Report Status PENDING   Incomplete    Studies/Results: No results found.    Assessment/Plan: Brian [name] is a 37 y.o. 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 He is sp bronchoscopy by Dr. Elsworth Soho and GREATLY appreciate his help here   #1 Non-resolving pneumonia HIV positive patient:  I agree with CCM that this is more likely a pneumonitis in response to bleeding  His AFB from [facility] is negative so:  --CAN DC airborne (which I have done) --observe off antibiotics but does NOT need to stay in the hospital for this.  I spent greater than 45 minutes with the patient including greater than 50% of time in face to face counsel of the patient nad his friend and in coordination of their care.   #2 Leukopenia, TTPenia and Lymphopenia:   --I changed him  FROM AZT TO ABACAVIR AT 600MG  DAILY (HIS HLA B70 IS NEGATIVE) AND WILL KEEP THE REST OF HIS REGIMEN THE SAME   IF counts fail to improve he will need BMarrow biopsy for path, afb, fungal bacterial culturs  #3 HIV: Undetectable! And CD4 is >300 --continue NEW regimen of Ziagen, Epivir, Sustiva --he should fu with [place] in [facility] with MD in next few weeks  -GIven his anxiety about his meds being dosed rx correctly please go over them with him on DC and also I will ask if he can be seen by one of our Pharmacists next week to review --give flu vaccine  #4 Scrotal edema: secondary to third spaciing, fine to give him topical nystatin  #5 ESRD on HD: can have HD withoout airborne  I will sign off for now please call with further questions.      LOS: 8 days   Alcide Evener 08/06/2013, 3:40 PM

## 2013-08-06 NOTE — Progress Notes (Signed)
Subjective: [name] is a 37 y.o. man with history of HIV (followed by Dr. 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  Patient seen at bedside this AM. Says he feels much better today. Feels that his cough is much improved, as is his breathing. No fevers overnight. Still w/ some bruising at fistula site. Had another session of HD today. AFB smear from bronchial washings negative. Transferred out of negative pressure room.  For discharge tomorrow AM.  Objective: Vital signs in last 24 hours: Filed Vitals:   08/06/13 1500 08/06/13 1530 08/06/13 1600 08/06/13 1630  BP: 111/71 133/80 121/81 123/75  Pulse: 103 110 104 105  Temp:      TempSrc:      Resp: 20 16 18 20   Height:      Weight:      SpO2:       Weight change: -24 lb 6.9 oz (-11.081 kg)  Intake/Output Summary (Last 24 hours) at 08/06/13 1652 Last data filed at 08/06/13 0950  Gross per 24 hour  Intake    720 ml  Output      1 ml  Net    719 ml   Physical Exam: General: Alert, cooperative, and in no apparent distress HEENT: Vision grossly intact, oropharynx clear and non-erythematous  Neck: Full range of motion without pain, supple, no lymphadenopathy or carotid bruits Lungs: Clear to ascultation bilaterally. Very mild crackles to auscultation of right lung.  Heart: Regular rate and rhythm, no murmurs, gallops, or rubs Abdomen: Distended, still with mild tenderness to palpation. Mass palpated in RLQ, most likely part of his PCK. Liver palpated on exam. Bowel sounds present. Has small amount of scrotal edema w/ mild erythema. Extremities: +1 pitting edema in the LE's. Better than yesterday. No cyanosis or clubbing. Neurologic: Alert & oriented X3, cranial nerves II-XII intact, strength grossly intact, sensation intact to light touch.  Lab  Results: Basic Metabolic Panel:  Recent Labs Lab 08/04/13 0828 08/05/13 1105 08/06/13 1344  NA 127* 132* 130*  K 3.7 4.0 4.0  CL 90* 93* 93*  CO2 19 25 21   GLUCOSE 110* 126* 143*  BUN 64* 45* 57*  CREATININE 6.49* 5.09* 6.14*  CALCIUM 7.7* 8.3* 8.1*  PHOS 5.3*  --  4.8*   Liver Function Tests:  Recent Labs Lab 08/03/13 1914  08/05/13 1105 08/06/13 1344  AST 163*  --  124*  --   ALT 149*  --  126*  --   ALKPHOS 86  --  84  --   BILITOT 0.3  --  0.5  --   PROT 5.8*  --  6.4  --   ALBUMIN 2.0*  < > 2.2* 2.0*  < > = values in this interval not displayed. CBC:  Recent Labs Lab 08/04/13 0826 08/05/13 1105 08/06/13 1344  WBC 2.9* 3.4* 3.3*  NEUTROABS 2.3 2.6  --   HGB 6.6* 8.4* 8.6*  HCT 18.3* 23.2* 24.0*  MCV 99.5 97.1 98.4  PLT 79* 95* 86*   Coagulation:  Recent Labs Lab 08/02/13 1056 08/03/13 1914 08/04/13 0800 08/05/13 1105  LABPROT 24.3* 17.9* 16.2* 16.1*  INR 2.27* 1.52* 1.33 1.32   Urine Drug Screen: Drugs of Abuse     Component Value Date/Time   LABOPIA NEG 03/26/2013 1519   LABOPIA POSITIVE* 09/22/2010 1140  COCAINSCRNUR NEG 03/26/2013 1519   COCAINSCRNUR NONE DETECTED 09/22/2010 1140   LABBENZ NEG 03/26/2013 1519   LABBENZ NEG 07/30/2011 1656   LABBENZ POSITIVE* 09/22/2010 1140   AMPHETMU NEG 07/30/2011 1656   AMPHETMU NONE DETECTED 09/22/2010 1140   THCU NEG 03/26/2013 1519   THCU NONE DETECTED 09/22/2010 1140   LABBARB NEG 03/26/2013 1519   LABBARB  Value: NONE DETECTED        DRUG SCREEN FOR MEDICAL PURPOSES ONLY.  IF CONFIRMATION IS NEEDED FOR ANY PURPOSE, NOTIFY LAB WITHIN 5 DAYS.        LOWEST DETECTABLE LIMITS FOR URINE DRUG SCREEN Drug Class       Cutoff (ng/mL) Amphetamine      1000 Barbiturate      200 Benzodiazepine   [id number] Tricyclics       [id number] Opiates          300 Cocaine          300 THC              50 09/22/2010 1140    Micro Results: Recent Results (from the past 240 hour(s))  CULTURE, BLOOD (SINGLE)     Status: None   Collection  Time    07/29/13  2:18 PM      Result Value Range Status   Organism ID, Bacteria NO GROWTH 5 DAYS   Final  MRSA PCR SCREENING     Status: None   Collection Time    07/29/13  6:15 PM      Result Value Range Status   MRSA by PCR NEGATIVE  NEGATIVE Final   Comment:            The GeneXpert MRSA Assay (FDA     approved for NASAL specimens     only), is one component of a     comprehensive MRSA colonization     surveillance program. It is not     intended to diagnose MRSA     infection nor to guide or     monitor treatment for     MRSA infections.  CULTURE, BLOOD (ROUTINE X 2)     Status: None   Collection Time    07/29/13 11:10 PM      Result Value Range Status   Specimen Description BLOOD LEFT ARM   Final   Special Requests BOTTLES DRAWN AEROBIC AND ANAEROBIC 10CC   Final   Culture  Setup Time     Final   Value: 07/30/2013 06:02     Performed at [facility]   Culture     Final   Value: NO GROWTH 5 DAYS     Performed at [facility]   Report Status 08/05/2013 FINAL   Final  CULTURE, BLOOD (ROUTINE X 2)     Status: None   Collection Time    07/29/13 11:16 PM      Result Value Range Status   Specimen Description BLOOD LEFT HAND   Final   Special Requests BOTTLES DRAWN AEROBIC AND ANAEROBIC 5CC   Final   Culture  Setup Time     Final   Value: 07/30/2013 06:02     Performed at [facility]   Culture     Final   Value: NO GROWTH 5 DAYS     Performed at [facility]   Report Status 08/05/2013 FINAL   Final  CULTURE, EXPECTORATED SPUTUM-ASSESSMENT     Status: None   Collection Time  07/30/13 12:45 AM      Result Value Range Status   Specimen Description SPUTUM   Final   Special Requests NONE   Final   Sputum evaluation     Final   Value: MICROSCOPIC FINDINGS SUGGEST THAT THIS SPECIMEN IS NOT REPRESENTATIVE OF LOWER RESPIRATORY SECRETIONS. PLEASE RECOLLECT.     CALLED TO MOORE,K RN 07/30/2013 0347 JORDANS   Report Status 07/30/2013 FINAL    Final  CULTURE, EXPECTORATED SPUTUM-ASSESSMENT     Status: None   Collection Time    07/30/13  3:08 PM      Result Value Range Status   Specimen Description SPUTUM   Final   Special Requests NONE   Final   Sputum evaluation     Final   Value: MICROSCOPIC FINDINGS SUGGEST THAT THIS SPECIMEN IS NOT REPRESENTATIVE OF LOWER RESPIRATORY SECRETIONS. PLEASE RECOLLECT.     NOTIFIED A TONEY 1605 07/30/13 A BROWNING   Report Status 07/30/2013 FINAL   Final  AFB CULTURE WITH SMEAR     Status: None   Collection Time    07/30/13  3:08 PM      Result Value Range Status   Specimen Description SPUTUM   Final   Special Requests NONE   Final   ACID FAST SMEAR     Final   Value: NO ACID FAST BACILLI SEEN     Performed at [facility]   Culture     Final   Value: CULTURE WILL BE EXAMINED FOR 6 WEEKS BEFORE ISSUING A FINAL REPORT     Performed at [facility]   Report Status PENDING   Incomplete  CULTURE, EXPECTORATED SPUTUM-ASSESSMENT     Status: None   Collection Time    07/30/13  8:41 PM      Result Value Range Status   Specimen Description SPUTUM   Final   Special Requests NONE   Final   Sputum evaluation     Final   Value: MICROSCOPIC FINDINGS SUGGEST THAT THIS SPECIMEN IS NOT REPRESENTATIVE OF LOWER RESPIRATORY SECRETIONS. PLEASE RECOLLECT.     NOTIFIED R SPARKS RN 2235 07/30/13 A BROWNING   Report Status 07/30/2013 FINAL   Final  AFB CULTURE WITH SMEAR     Status: None   Collection Time    07/30/13  8:41 PM      Result Value Range Status   Specimen Description SPUTUM   Final   Special Requests Immunocompromised   Final   ACID FAST SMEAR     Final   Value: NO ACID FAST BACILLI SEEN     Performed at [facility]   Culture     Final   Value: CULTURE WILL BE EXAMINED FOR 6 WEEKS BEFORE ISSUING A FINAL REPORT     Performed at [facility]   Report Status PENDING   Incomplete  AFB CULTURE WITH SMEAR     Status: None   Collection Time    07/31/13  2:44 PM       Result Value Range Status   Specimen Description SPU   Final   Special Requests Immunocompromised   Final   ACID FAST SMEAR     Final   Value: NO ACID FAST BACILLI SEEN     Performed at [facility]   Culture     Final   Value: CULTURE WILL BE EXAMINED FOR 6 WEEKS BEFORE ISSUING A FINAL REPORT     Performed at [facility]  Report Status PENDING   Incomplete  CULTURE, RESPIRATORY (NON-EXPECTORATED)     Status: None   Collection Time    07/31/13  2:44 PM      Result Value Range Status   Specimen Description SPUTUM   Final   Special Requests NONE   Final   Gram Stain     Final   Value: RARE WBC PRESENT, PREDOMINANTLY PMN     FEW SQUAMOUS EPITHELIAL CELLS PRESENT     FEW GRAM POSITIVE COCCI IN CLUSTERS     FEW GRAM NEGATIVE RODS     Performed at [facility]   Culture     Final   Value: NORMAL OROPHARYNGEAL FLORA     Performed at [facility]   Report Status 08/03/2013 FINAL   Final  AFB CULTURE WITH SMEAR     Status: None   Collection Time    08/04/13  8:34 AM      Result Value Range Status   Specimen Description SPUTUM   Final   Special Requests NONE   Final   ACID FAST SMEAR     Final   Value: NO ACID FAST BACILLI SEEN     Performed at [facility]   Culture     Final   Value: CULTURE WILL BE EXAMINED FOR 6 WEEKS BEFORE ISSUING A FINAL REPORT     Performed at [facility]   Report Status PENDING   Incomplete  AFB CULTURE WITH SMEAR     Status: None   Collection Time    08/05/13  8:30 AM      Result Value Range Status   Specimen Description BRONCHIAL WASHINGS   Final   Special Requests RRL   Final   ACID FAST SMEAR     Final   Value: NO ACID FAST BACILLI SEEN     Performed at [facility]   Culture     Final   Value: CULTURE WILL BE EXAMINED FOR 6 WEEKS BEFORE ISSUING A FINAL REPORT     Performed at [facility]   Report Status PENDING   Incomplete  FUNGUS CULTURE W SMEAR     Status: None    Collection Time    08/05/13  8:30 AM      Result Value Range Status   Specimen Description BRONCHIAL WASHINGS   Final   Special Requests NONE   Final   Fungal Smear     Final   Value: NO YEAST OR FUNGAL ELEMENTS SEEN     Performed at [facility]   Culture     Final   Value: CULTURE IN PROGRESS FOR FOUR WEEKS     Performed at [facility]   Report Status PENDING   Incomplete  PNEUMOCYSTIS JIROVECI SMEAR BY DFA     Status: None   Collection Time    08/05/13  8:30 AM      Result Value Range Status   Specimen Source-PJSRC BRONCHIAL WASHINGS   Final   Pneumocystis jiroveci Ag NEGATIVE   Final   Comment: Performed at [facility]  LEGIONELLA CULTURE     Status: None   Collection Time    08/05/13  8:30 AM      Result Value Range Status   Specimen Description BRONCHIAL WASHINGS   Final   Special Requests RRL   Final   Culture     Final   Value: NO LEGIONELLA ISOLATED, CULTURE IN PROGRESS  FOR 5 DAYS     Performed at [facility]   Report Status PENDING   Incomplete  CULTURE, RESPIRATORY (NON-EXPECTORATED)     Status: None   Collection Time    08/05/13  8:30 AM      Result Value Range Status   Specimen Description BRONCHIAL WASHINGS RLL   Final   Special Requests NONE   Final   Gram Stain     Final   Value: FEW WBC PRESENT, PREDOMINANTLY PMN     FEW SQUAMOUS EPITHELIAL CELLS PRESENT     MODERATE GRAM POSITIVE COCCI IN PAIRS     IN CLUSTERS MODERATE GRAM NEGATIVE RODS     Performed at [facility]   Culture     Final   Value: Non-Pathogenic Oropharyngeal-type Flora Isolated.     Performed at [facility]   Report Status PENDING   Incomplete   Studies/Results: No results found. Medications: I have reviewed the patient's current medications. Scheduled Meds: . abacavir  600 mg Oral Daily  . calcium acetate  1,334 mg Oral TID WC  . [START ON 08/09/2013] darbepoetin (ARANESP) injection - DIALYSIS  200 mcg Intravenous Q Mon-HD   . efavirenz  600 mg Oral QHS  . lamiVUDine  50 mg Oral Daily  . multivitamin  1 tablet Oral Daily  . nicotine  14 mg Transdermal QPC supper  . nystatin   Topical BID  . oxyCODONE      . polyethylene glycol  17 g Oral Daily   Continuous Infusions:   PRN Meds:.sodium chloride, sodium chloride, sodium chloride, sodium chloride, albuterol, feeding supplement (NEPRO CARB STEADY), fentaNYL, heparin, heparin, ibuprofen, lidocaine (PF), lidocaine (PF), lidocaine, lidocaine-prilocaine, midazolam, ondansetron (ZOFRAN) IV, ondansetron, oxyCODONE, pentafluoroprop-tetrafluoroeth, phenylephrine Assessment/Plan:  #CAP vs. HCAP - History of acute development of productive cough with CXR showing lobar consolidation are most suggestive of CAP, however, the patient was recently hospitalized in 05/2013, therefore this must be treated as HCAP.  Borderline fevers noted (100.3-101.3 F) initially. Still w/out fevers overnight. For now, the patient is still in a negative pressure room. Seems improved today. AFB sputum smear negative x3. Blood culture shows no growth so far. Legionella antigen and strep pneumo antigen -ve.  - Chest CT on 08/03/13 showed diffuse geographic ground-glass attenuation in the lungs with areas of sparing. The differential considerations include infection including atypical agents in this patient with immunosuppression and asymmetric/atypical pulmonary edema.  - Had inspection bronchoscopy yesterday. Sent bronchial washings, PCP -ve, AFB smear -ve. - ID note from today says it's okay to d/c negative pressure room. Lung findings at this point most likely d/t pneumonitis in response to bleeding.  - Sent for viral respiratory panel.  - D/c'ed Vanco and cefipime on 08/04/13 as he was not improving on this regimen. Do not suspect MRSA or pseudomonal pna at this time. - Follow up blood and sputum cultures. Still negative. - Albuterol inhaler prn wheezing  - Oxycodone 5-10mg  q6h prn pain  - Zofran prn  nausea   #Pancytopenia - Mr. Relph has had low WBC's, platelets, and Hb since admission.  - Today, Hb is 8.6. Remains stable since yesterday. Receiving darbopoetin w/ dialysis. - CT abdomen on 08/03/13 relatively unchanged from previous CT. No evidence of bleeding w/in the peritoneum.  - Continue monitoring CBC.  - Patient was admitted w/ platelets of 104, today they are 32.  - Seen by ID yesterday, feel that pancytopenia may be d/t poor HIV control or myelosuppression. Consider Heme/onc  referral as an outpatient if no improvement on new HIV regimen (see below).  #Transaminitis - Yesterday, AST 124, ALT 126, TP 6.4, albumin 2.2, and Alk phos of 84. AST/ALT significantly improved from day previous. Transaminitis may have been 2/2 azithromycin use for HCAP. D/c'ed Azithromycin on 08/01/13. - Hepatitis serologies all negative. - Continue to follow CMET. - Avoiding medications with tylenol.  #Electrolyte abnormalities- BMP improved since starting HD. First session of HD 08/01/13. Had HD today.  #Supratherapeutic INR - 1.32 yesterday, much improved. - Okay to restart Coumadin on discharge.   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 BUN 57. Still making urine. First session of HD 08/01/13, patient tolerated well. For HD today. Patient still w/ LE edema, improved scrotal edema. Most likely contributed to by low albumin and decreased oncotic pressure. Patient still w/ bruising at fistula site. Will do HD on outpatient basis as per renal.  #HTN - BP stable. 117/75 this AM. - Holding home norvasc. Restart on discharge.  #HIV - Seen by ID. HIV regimen changed; Switched AZT to abacavir. Now on abacavir, efavirens, and lamivudine. - Sent for CD4 count and viral load, now 300 and undetectable, respectively. Improved from yesterday w/ new therapy. Will follow w/ ID. If no improvement w/ pancytopenia, ID suggested heme/onc consult for bone marrow biopsy.  Dispo: Disposition is deferred at  this time, awaiting improvement of current medical problems.  Anticipated discharge tomorrow.   The patient does have a current PCP [name], MD) and does need an [facility] hospital follow-up appointment after discharge.  The patient does not have transportation limitations that hinder transportation to clinic appointments.  .Services Needed at time of discharge: Y = Yes, Blank = No PT: N  OT: N  RN: N  Equipment: N  Other: N    LOS: 8 days   Luanne Bras, MD 08/06/2013, 4:52 PM Pager: [phone]

## 2013-08-06 NOTE — Progress Notes (Signed)
[facility] Progress Note  Subjective:   Feeling pretty good. Ready to go home. "I feel like a prisoner in this room." Testicle/penile swelling improving some. Denies further cough/hemoptysis. Looks clinically well- pulm has signed off- HD pending for today  Objective Filed Vitals:   08/05/13 1435 08/05/13 1730 08/05/13 2024 08/06/13 0635  BP: 116/72 121/83 114/78 116/78  Pulse: 102 108 98 101  Temp: 97.9 F (36.6 C) 98.5 F (36.9 C) 98.5 F (36.9 C) 98 F (36.7 C)  TempSrc: Oral Oral Oral Axillary  Resp: 20 20 20 20   Height:   6\' 4"  (1.93 m)   Weight:   90.719 kg (200 lb)   SpO2: 97% 97% 96% 98%   Physical Exam General: Alert, cooperative, NAD Heart: RRR, Lungs: Decr at bases, no wheezes, rales, rhonchi Abdomen: Distended, tenderness mild but diffuse, normal BS Extremities: Dangling LE's, trace edema left/ 1+ on the right Dialysis Access: RUA AVF with local bruising/ + bruit  Assessment/Plan: 1. Cough/Fever- afebrile. Now s/p bronch and ID on board. AFB smear negative times several- continuing neg pressure room for now. Sputum cultures pending. Quantiferon TB test neg . Chest CT 8/19 showed: ". Diffuse geographic ground-glass attenuation in the lungs with areas of sparing. The differential considerations include infection including atypical agents in this patient with immunosuppression and asymmetric/atypical pulmonary edema." Continuing to titrate volume down with dialysis.  2. ESRD - to be MWF at [facility] second shift. First HD 8/17, 1 L off. Second 8/18 again one liter off secondary to soft BP- tolerated well. Third HD tmt 8/20 with ~1.9L off. Next HD today. Of note, also has been sent to [facility] for possibility of transplant but financially he is not able to swing it so has taken himself out of consideration for now.  3. Anemia - Hgb improving, now  8.4 s/p transfusion on 8/14 and 8/20. (HIV?) received Aranesp 100 last week - dose increased to [id number], continue ferrlicit (0000000  x 5) Gi deferred invasive procedures. Continue to follow - has parameters for transfusion for hgb < 7. Epogen at discharge. 4. Secondary hyperparathyroidism - Ca+ 8.3. Phos improving 5.3 < 7.6 on Phoslo 2 ac. Last PTH was 262, no vitamin D as of yet  5. HTN/volume - SBPs 110s-120s, continue to challenge as able. Srotal edema likely part of all of this, also antifungal powder per ID  6. Nutrition - Alb 2.2, on renal diet, multivitamin 7. HIV- continue current therapy  8. Pancytopenia - WBC 6.1 on admission, now 3.4. Platelets 104 down to 95; Hgb 8.4 ? Related to HIV, ID recommends heme/onc consult if no further improvement 9. Transaminitis - trending down; serologies negative; follow  10. Elevated INR - trending down - not on coumadin- per primary team  11. Disp - outpt HD arranged MWF2 at [facility]; from a renal perspective he is cleared for d/c  Santiago Glad E. Cletus Gash, PA-C [facility] Pager [phone] 08/06/2013,9:19 AM  LOS: 8 days   Patient seen and examined, agree with above note with above modifications.  Doing well- really wanting to go home- may be close to leaving AMA- HD today and continue to decrease volume as able- all is set up for OP  Corliss Parish, MD 08/06/2013     Additional Objective Labs: Basic Metabolic Panel:  Recent Labs Lab 08/02/13 1056 08/03/13 1914 08/04/13 0828 08/05/13 1105  NA 127* 131* 127* 132*  K 3.2* 3.4* 3.7 4.0  CL 86* 91* 90* 93*  CO2  23 23 19 25   GLUCOSE 127* 148* 110* 126*  BUN 94* 66* 64* 45*  CREATININE 8.74* 6.09* 6.49* 5.09*  CALCIUM 7.5* 7.5* 7.7* 8.3*  PHOS 7.6*  --  5.3*  --    Liver Function Tests:  Recent Labs Lab 08/02/13 1147 08/03/13 1914 08/04/13 0828 08/05/13 1105  AST 230* 163*  --  124*  ALT 192* 149*  --  126*  ALKPHOS 74 86  --  84  BILITOT 0.4 0.3  --  0.5  PROT 5.7* 5.8*  --  6.4  ALBUMIN 2.1* 2.0* 2.0* 2.2*   CBC:  Recent Labs Lab 08/01/13 0500  08/02/13 2235 08/03/13 0905  08/03/13 1321 08/04/13 0826 08/05/13 1105  WBC 4.1  < > 2.5* 3.2* 3.5* 2.9* 3.4*  NEUTROABS 3.7  --   --   --   --  2.3 2.6  HGB 7.5*  < > 7.1* 7.4* 7.9* 6.6* 8.4*  HCT 20.8*  < > 19.4* 20.5* 21.5* 18.3* 23.2*  MCV 101.0*  < > 99.5 100.0 98.6 99.5 97.1  PLT 99*  < > 82* 82* 91* 79* 95*  < > = values in this interval not displayed. Blood Culture    Component Value Date/Time   SDES BRONCHIAL WASHINGS RLL 08/05/2013 0830   SPECREQUEST NONE 08/05/2013 0830   CULT PENDING 08/05/2013 0830   REPTSTATUS PENDING 08/05/2013 0830    Studies/Results: No results found. Medications:   . abacavir  600 mg Oral Daily  . calcium acetate  1,334 mg Oral TID WC  . [START ON 08/09/2013] darbepoetin (ARANESP) injection - DIALYSIS  200 mcg Intravenous Q Mon-HD  . efavirenz  600 mg Oral QHS  . ferric gluconate (FERRLECIT/NULECIT) IV  125 mg Intravenous Daily  . lamiVUDine  50 mg Oral Daily  . multivitamin  1 tablet Oral Daily  . nicotine  14 mg Transdermal QPC supper  . nystatin   Topical BID  . polyethylene glycol  17 g Oral Daily

## 2013-08-06 NOTE — Progress Notes (Signed)
PULMONARY  / CRITICAL CARE MEDICINE  Name: Brian [name] MRN: [id number] DOB: 1976/01/15    ADMISSION DATE:  07/29/2013 CONSULTATION DATE:  08/04/2013  REFERRING MD :  Bertha Stakes  CHIEF COMPLAINT:  Hemoptysis  BRIEF PATIENT DESCRIPTION:  37 yo male smoker with hx of HIV and DVT on coumadin presented with fever, hemoptysis, nausea, and vomiting in setting of PNA.  SIGNIFICANT EVENTS: 8/14 Admit 8/16 Palliative care consult >> pt agreed to start hemodialysis, full code 8/18 GI consult for evaluation of anemia >> s/o 8/21 8/21 Bronchoscopy >> Bleeding noted from Rt nares, minimal aspirated blood from RML  STUDIES:  8/19 CT chest >> asymmetric, diffuse GGO, more focal consolidation RLL, small Rt effusion 8/19 CT abd/pelvis >> Polycystic kidneys 8/21 BAL RLL cytology >>   LINES / TUBES: Rt arm AV fistula  CULTURES: 8/14 Blood >> negative 8/15 Pneumococcal Ag >> negative 8/15 Legionella Ag >> negative 8/15 Sputum AFB >> smear negative >>  8/16 Sputum AFB >> smear negative >>  8/16 Sputum >> oral flora 8/18 Quantiferon gold >> negative 8/20 HIV1 RNA quant >> less than 20, CD4 >> 250 8/20 Sputum AFB >> smear negative >>  8/21 BAL for PCP >> negative 8/21 BAL >>  8/21 BAL RSV >>  8/21 BAL AFB >>  8/21 BAL fungal >>  8/21 BAL legionella >>   ANTIBIOTICS: Azithromycin 8/14 >> 8/17 Rocephin 8/14 >> 8/14 Cefepime 8/15 >> 8/20 Vancomycin 8/15 >> 8/20  SUBJECTIVE:  No longer coughing blood.  Still has cough and sputum, but phlegm more clear.  Denies chest pain.  Very anxious to go home.  VITAL SIGNS: Temp:  [97.8 F (36.6 C)-98.5 F (36.9 C)] 98 F (36.7 C) (08/22 0635) Pulse Rate:  [98-108] 101 (08/22 0635) Resp:  [18-20] 20 (08/22 0635) BP: (114-160)/(72-84) 116/78 mmHg (08/22 0635) SpO2:  [95 %-98 %] 98 % (08/22 0635) Weight:  [200 lb (90.719 kg)] 200 lb (90.719 kg) (08/21 2024) Room air  PHYSICAL EXAMINATION: General:  No distress Neuro:  Alert, normal  strength HEENT:  No sinus tenderness, poor dentition, no LAN Cardiovascular:  s1s2 regular, with 2/6 SM Lungs:  B/l rhonchi clear with cough Abdomen:  Soft, non tender Musculoskeletal:  Rt arm AV fistula Skin:  No rashes   Recent Labs Lab 08/03/13 1914 08/04/13 0828 08/05/13 1105  NA 131* 127* 132*  K 3.4* 3.7 4.0  CL 91* 90* 93*  CO2 23 19 25   BUN 66* 64* 45*  CREATININE 6.09* 6.49* 5.09*  GLUCOSE 148* 110* 126*    Recent Labs Lab 08/03/13 1321 08/04/13 0826 08/05/13 1105  HGB 7.9* 6.6* 8.4*  HCT 21.5* 18.3* 23.2*  WBC 3.5* 2.9* 3.4*  PLT 91* 79* 95*   No results found.  ASSESSMENT / PLAN:  37 yo male smoker with hemoptysis in setting of HAP with HIV, DVT dx in early 2000's and PE dx in 2011 on coumadin as outpt, thrombocytopenia, and ESRD recently started on HD.  Noted to have bleeding from Rt nares at time of bronchoscopy, and minimal bleeding from RML >> no endobronchial lesions.  Hemoptysis has resolved. Plan: -f/u BAL cultures, cytology -smoking cessation -f/u CXR intermittently to monitor for resolution of pulmonary infiltrates -monitor off ABx -consider ENT consult if epistaxis recurs -okay to resume coumadin per primary team  Stable for d/c home from pulmonary standpoint.  He does not need outpt pulmonary follow up unless his cytology is positive or he fails to clear his pulmonary infiltrates.  PCCM will sign off.  Please call if additional help needed.  Chesley Mires, MD [facility] Pulmonary/Critical Care 08/06/2013, 9:24 AM Pager:  [phone] After 3pm call: [phone]

## 2013-08-06 NOTE — Procedures (Signed)
Patient was seen on dialysis and the procedure was supervised.  BFR 300  Via AVF BP is  128/78.   Patient appears to be tolerating treatment well  Brian [name] A 08/06/2013

## 2013-08-06 NOTE — Progress Notes (Signed)
Internal Medicine Attending  Date: 08/06/2013  Patient name: Brian [name] Medical record number: [id number] Date of birth: Mar 06, 1976 Age: 37 y.o. Gender: male  I saw and evaluated the patient. I reviewed the resident's note by Dr. Ronnald Ramp and I agree with the resident's findings and plans as documented in his note.

## 2013-08-07 ENCOUNTER — Inpatient Hospital Stay (HOSPITAL_COMMUNITY): Payer: Medicaid Other

## 2013-08-07 LAB — CULTURE, RESPIRATORY W GRAM STAIN

## 2013-08-07 LAB — CBC WITH DIFFERENTIAL/PLATELET
Eosinophils Absolute: 0 10*3/uL (ref 0.0–0.7)
Eosinophils Relative: 0 % (ref 0–5)
HCT: 21.3 % — ABNORMAL LOW (ref 39.0–52.0)
Hemoglobin: 7.6 g/dL — ABNORMAL LOW (ref 13.0–17.0)
Lymphocytes Relative: 24 % (ref 12–46)
Lymphs Abs: 0.8 10*3/uL (ref 0.7–4.0)
MCH: 36 pg — ABNORMAL HIGH (ref 26.0–34.0)
MCV: 100.9 fL — ABNORMAL HIGH (ref 78.0–100.0)
Monocytes Absolute: 0.4 10*3/uL (ref 0.1–1.0)
Monocytes Relative: 12 % (ref 3–12)
Platelets: 78 10*3/uL — ABNORMAL LOW (ref 150–400)
RBC: 2.11 MIL/uL — ABNORMAL LOW (ref 4.22–5.81)
WBC: 3.4 10*3/uL — ABNORMAL LOW (ref 4.0–10.5)

## 2013-08-07 LAB — COMPREHENSIVE METABOLIC PANEL
ALT: 78 U/L — ABNORMAL HIGH (ref 0–53)
BUN: 35 mg/dL — ABNORMAL HIGH (ref 6–23)
CO2: 25 mEq/L (ref 19–32)
Calcium: 7.9 mg/dL — ABNORMAL LOW (ref 8.4–10.5)
Creatinine, Ser: 4.19 mg/dL — ABNORMAL HIGH (ref 0.50–1.35)
GFR calc Af Amer: 19 mL/min — ABNORMAL LOW (ref >90)
GFR calc non Af Amer: 16 mL/min — ABNORMAL LOW (ref >90)
Glucose, Bld: 116 mg/dL — ABNORMAL HIGH (ref 70–99)
Total Protein: 5.4 g/dL — ABNORMAL LOW (ref 6.0–8.3)

## 2013-08-07 MED ORDER — POLYETHYLENE GLYCOL 3350 17 G PO PACK: 17.0000 g | PACK | Freq: Every day | ORAL | Status: DC

## 2013-08-07 MED ORDER — ABACAVIR SULFATE 300 MG PO TABS: 600.0000 mg | ORAL_TABLET | Freq: Every day | ORAL | Status: DC

## 2013-08-07 MED ORDER — CALCIUM ACETATE 667 MG PO CAPS: 1334.0000 mg | ORAL_CAPSULE | Freq: Three times a day (TID) | ORAL | Status: DC

## 2013-08-07 NOTE — Discharge Summary (Signed)
Name: Brian [name] DOB: [birth year] 37 y.o. PCP: [name], MD  Date of Admission: 07/29/2013  5:48 PM Date of Discharge: 08/07/2013 Attending Physician: Dr. Bertha Stakes  Discharge Diagnosis: 1. HCAP/CAP- Presented w/ cough, low grade fever, and hemoptysis. Was previously hospitalized in June. Patient was treated w/ multiple antibiotics, but more importantly with a full course of Vancomycin and Cefepime.  2. ESRD- Had a mature RUE AV graft prior to admission, but HD had yet to be started d/t the patient's unwillingness to undergo HD d/t a fear of needles. Started HD during admission, tolerated well. Cr on admission was in the 11.0 range, discharged in the 4.0 range.  3. Transaminitis- Almost completely resolved on discharge. 4. Pancytopenia 5. HIV- On Lamivudine, Zidovidine, and Efavirenz on admission. Changed Zidovudine to Abacavir during admission d/t significant pancytopenia. CD4 count and viral load were 580 and undetectable in June, respectively. Repeated during admission, found to have a CD4 count and viral load of 300 and undetectable, respectively. 6. HTN  Discharge Medications:   Medication List    STOP taking these medications       zidovudine 300 MG tablet  Commonly known as:  RETROVIR      TAKE these medications       abacavir 300 MG tablet  Commonly known as:  ZIAGEN  Take 2 tablets (600 mg total) by mouth daily.     albuterol 108 (90 BASE) MCG/ACT inhaler  Commonly known as:  PROVENTIL HFA;VENTOLIN HFA  Inhale 2 puffs into the lungs every 6 (six) hours as needed for wheezing.     amLODipine 10 MG tablet  Commonly known as:  NORVASC  Take 1 tablet (10 mg total) by mouth daily.     calcitRIOL 0.25 MCG capsule  Commonly known as:  ROCALTROL  Take 0.25 mcg by mouth daily.     calcium acetate 667 MG capsule  Commonly known as:  PHOSLO  Take 2 capsules (1,334 mg total) by mouth 3 (three) times daily with meals.     efavirenz 600 MG tablet    Commonly known as:  SUSTIVA  Take 1 tablet (600 mg total) by mouth at bedtime. Take with AZT and 3TC     hydrocortisone 25 MG suppository  Commonly known as:  ANUSOL-HC  Place 1 suppository (25 mg total) rectally 2 (two) times daily as needed for hemorrhoids.     lamivudine 100 MG tablet  Commonly known as:  EPIVIR  Take 1 tablet (100 mg total) by mouth daily.     oxyCODONE-acetaminophen 10-325 MG per tablet  Commonly known as:  PERCOCET  Take 1 tablet by mouth every 8 (eight) hours as needed for pain. For pain     polyethylene glycol packet  Commonly known as:  MIRALAX / GLYCOLAX  Take 17 g by mouth daily.     pramipexole 0.25 MG tablet  Commonly known as:  MIRAPEX  Take 1 tablet (0.25 mg total) by mouth at bedtime.     sodium bicarbonate 650 MG tablet  Take 1 tablet (650 mg total) by mouth 3 (three) times daily.     warfarin 5 MG tablet  Commonly known as:  COUMADIN  TAKE 1 TABLET BY MOUTH DAILY EXCEPT ON MONDAY WEDNESDAY AND FRIDAY TAKE 1 AND ONE-HALF TABLET        Disposition and follow-up:   Brian [name] was discharged from [facility] in good condition.  At the hospital follow up visit please address:  1.  Progress w/ outpatient HD, hemoptysis, SOB, cough. Perform CBC to assess for worsening/progression of pancytopenia on new HIV drug regimen w/ new use of Abacavir in the place of AZT. If still with pancytopenia, refer to oncologist to consider BM biopsy as per ID recommendations.  2.  Labs / imaging needed at time of follow-up: CBC, BMET, LFT's  3.  Pending labs/ test needing follow-up: none  Follow-up Appointments:     Follow-up Information   Follow up with Scharlene Gloss, MD On 08/19/2013. (11:30 AM)    Specialty:  Infectious Diseases   Contact information:   [address] [address] [phone]       Follow up with Michail Jewels, MD On 08/12/2013. (10:15 AM)    Specialty:  Internal Medicine   Contact  information:   [place] [address] [phone]       Discharge Instructions: Discharge Orders   Future Appointments Provider Department Dept Phone   08/12/2013 10:15 AM Michail Jewels, MD [facility] [phone]   08/19/2013 11:30 AM Thayer Headings, MD [facility] for Infectious Disease [phone]   08/23/2013 3:00 PM [facility] for Infectious Disease [phone]   Future Orders Complete By Expires   Call MD for:  difficulty breathing, headache or visual disturbances  As directed    Call MD for:  persistant dizziness or light-headedness  As directed    Call MD for:  persistant nausea and vomiting  As directed    Call MD for:  temperature >100.4  As directed    Diet - low sodium heart healthy  As directed    Increase activity slowly  As directed       Consultations: Palliative care, GI, Renal, Pulmonology, ID  Procedures Performed:  Ct Abdomen Pelvis Wo Contrast  08/03/2013   *RADIOLOGY REPORT*  Clinical Data:  Bilateral lower extremity swelling.  End-stage renal disease and polycystic kidney disease.  HIV.  CT CHEST, ABDOMEN AND PELVIS WITHOUT CONTRAST  Technique:  Multidetector CT imaging of the chest, abdomen and pelvis was performed following the standard protocol without IV contrast.  Comparison:  05/19/2013.  03/22/2012.  01/22/2011.  CT CHEST  Findings:  There is asymmetric diffuse ground-glass attenuation in the lungs.  In this patient with HIV, atypical infection merits consideration.  Low attenuation of the intravascular compartment is present suggesting anemia.  There is more focal consolidation in the medial right lower lobe.  Aspiration is considered unlikely. Cardiomegaly is present, possibly associated with the anemia. Small right pleural effusion.  No aggressive osseous lesions. Enlarged mediastinal lymph nodes are present, which may be congestive or reactive.  There is no  axillary adenopathy.  IMPRESSION:  1.  Diffuse geographic ground-glass attenuation in the lungs with areas of sparing.  The differential considerations include infection including atypical agents in this patient with immunosuppression and asymmetric/atypical pulmonary edema. 2.  Anemia and cardiomegaly. 3.  Small right pleural effusion.  CT ABDOMEN AND PELVIS  Findings:  Polycystic kidney disease is present, with associated hepatic cyst.  Small amount of ascites is present.  The kidneys are massively enlarged and displays the other viscera anteriorly. There is no evidence of bowel obstruction allowing for noncontrast technique.  High attenuation in the right lower quadrant appears to reside within the wall of one of the renal cysts.  Small amount of ascites is present in the anatomic pelvis. Anasarca changes in the soft tissues.  No intra-abdominal free air. Chronic L2 compression fracture.  IMPRESSION:  1.  Polycystic kidney disease with associated renal cysts. 2.  The kidneys are massively enlarged and displays the other abdominal viscera.  Cysts are of varying density, with some simple low attenuation cyst, either hemorrhagic and some calcified. 3.  Small volume of ascites. 4.  No intra-abdominal or retroperitoneal hemorrhage identified.  5.  Anasarca.   Original Report Authenticated By: Dereck Ligas, M.D.   Dg Chest 2 View  08/07/2013   *RADIOLOGY REPORT*  Clinical Data: Follow up infiltrates  CHEST - 2 VIEW  Comparison: Chest  There are 07/29/2013 and chest CT, 08/03/2013.  Findings: Bilateral patchy airspace infiltrates, more notable on the right, but also in the left mid and lower lung zone, are stable from the recent CT, but increased from prior chest radiograph.  More focal opacity in the left lateral lung base obscures the left lateral hemidiaphragm, likely atelectasis.  No pneumothorax.  The cardiac silhouette is mildly enlarged.  No mediastinal or hilar masses are noted.  IMPRESSION: Bilateral  airspace lung opacities consistent with multifocal pneumonia.  These have increased from prior chest radiograph are stable from the more recent prior chest CT.  The left lateral lung base opacity is new from the prior CT, likely atelectasis.   Original Report Authenticated By: Lajean Manes, M.D.   Dg Chest 2 View  07/29/2013   *RADIOLOGY REPORT*  Clinical Data: Fever, night sweats, cough, congestion, hemoptysis. HIV.  Smoker.  CHEST - 2 VIEW  Comparison: Chest CT 01/22/2011.  Findings: There is air space consolidation in the superior segment right lower lobe.  There may be involvement of the posterior segment right upper lobe as well.  Lungs are otherwise clear.  No pleural fluid.  Heart size normal.  Trachea is midline.  IMPRESSION: Airspace consolidation in the superior segment right lower lobe and possibly posterior segment right upper lobe.  The findings are most consistent with pneumonia, possibly even active tuberculosis. These results will be called to the ordering clinician or representative by the Radiologist Assistant, and communication documented in the PACS Dashboard.   Original Report Authenticated By: [name], M.D.   Ct Chest Wo Contrast  08/03/2013   *RADIOLOGY REPORT*  Clinical Data:  Bilateral lower extremity swelling.  End-stage renal disease and polycystic kidney disease.  HIV.  CT CHEST, ABDOMEN AND PELVIS WITHOUT CONTRAST  Technique:  Multidetector CT imaging of the chest, abdomen and pelvis was performed following the standard protocol without IV contrast.  Comparison:  05/19/2013.  03/22/2012.  01/22/2011.  CT CHEST  Findings:  There is asymmetric diffuse ground-glass attenuation in the lungs.  In this patient with HIV, atypical infection merits consideration.  Low attenuation of the intravascular compartment is present suggesting anemia.  There is more focal consolidation in the medial right lower lobe.  Aspiration is considered unlikely. Cardiomegaly is present, possibly  associated with the anemia. Small right pleural effusion.  No aggressive osseous lesions. Enlarged mediastinal lymph nodes are present, which may be congestive or reactive.  There is no axillary adenopathy.  IMPRESSION:  1.  Diffuse geographic ground-glass attenuation in the lungs with areas of sparing.  The differential considerations include infection including atypical agents in this patient with immunosuppression and asymmetric/atypical pulmonary edema. 2.  Anemia and cardiomegaly. 3.  Small right pleural effusion.  CT ABDOMEN AND PELVIS  Findings:  Polycystic kidney disease is present, with associated hepatic cyst.  Small amount of ascites is present.  The kidneys are  massively enlarged and displays the other viscera anteriorly. There is no evidence of bowel obstruction allowing for noncontrast technique.  High attenuation in the right lower quadrant appears to reside within the wall of one of the renal cysts.  Small amount of ascites is present in the anatomic pelvis. Anasarca changes in the soft tissues.  No intra-abdominal free air. Chronic L2 compression fracture.  IMPRESSION:  1.  Polycystic kidney disease with associated renal cysts. 2.  The kidneys are massively enlarged and displays the other abdominal viscera.  Cysts are of varying density, with some simple low attenuation cyst, either hemorrhagic and some calcified. 3.  Small volume of ascites. 4.  No intra-abdominal or retroperitoneal hemorrhage identified.  5.  Anasarca.   Original Report Authenticated By: Dereck Ligas, M.D.   Admission HPI:  Brian [name] is a 37 y.o. man with history of HIV (followed by Dr. 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  The patient states his symptoms started on Sunday (8/10), after he got caught in the rain and then had to take a 2-hour bus  ride home. He states he was shivering on the bus from being so wet and cold. When he got home, he felt ill with subjective fevers and chills and was coughing. On Monday (8/11), he starting coughing up "chunks of blood" every 10 minutes. He describes the chunks as "mostly brown with some streaks of red." He has also had nausea and vomiting x15 episodes. He states the vomitus was watery and clear with some light streaks of red blood. He reports decreased po intake; he has only been able to eat some chicken nuggets and sips of water since Monday. He also feels quite dizzy and becomes short of breath with any exertion. He also endorses intermittent diarrhea, and states that the stool is a dark brown color. He has continued to take all of his medications, including his HAART and coumadin.  Prior to the onset of his symptoms on Sunday, he denies chronic or insidious fevers, chills, night sweats, weight loss, cough, or hemoptysis. Denies any recent travel. He has never been incarcerated. Denies sick contacts. He has never been homeless; however, the patient was recently evicted from [facility], a low income apartment complex in [place] that was condemned due to squalid conditions. He has a new apartment now, but has not yet completed his move in. He has been living with friends between here and [place] in the interim.  Hospital Course by problem list:   1. HCAP vs. CAP- Patient presented to the ED on 07/29/13 after being out in the cold rain for some time a couple days prior. He endorsed a recent history of cough, productive of sputum w/ some streaks of blood, low grade fever, and SOB. On admission, the patient presented w/ a WBC count of 6.1, but had a significant left shift of 85% PMN's. He also had a low grade fever b/w 100.3-101.3 F in the ED. CXR showed lobar consolidation most suggestive of CAP. However, given the patient's history and appearance of CXR, TB was suspected, especially in the picture of  hemoptysis. He denied any recent night sweats or weight loss and his most recent CD4 count was 580 in June of this year. The patient was subsequently placed in a negative pressure room and kept under airborne isolation  as TB was still a significant possibility. He was originally started on azithromycin and ceftriaxone for CAP. However, given his most recent hospitalization in 05/2013, the patient's ABx coverage was changed to cover for HCAP and he was switched to Azithromycin + Vancomycin + Cefepime. During the course of his admission, he continued to complain of productive cough and hemoptysis that eventually subsided towards the end of his hospital course. Sputum cultures were collected and AFB sputum smears were -ve x3. Given the patient's continuous reports of hemoptysis and lack of improvement symptomatically, TB was still suspected. Legionella and strep pneumonia antigen were shown to be negative. On 08/02/13, the patient still exhibited poor hepatic function w/ elevated LFT's and azithromycin was discontinued (had a 5 day course of azithro). On 08/02/13, the patient continued to show decreased Hb, so GI was consulted, but did not suspect a GIB at this time and associated blood loss more to continued hemoptysis and poor renal function. D/t little improvement of clinical picture, Chest CT was performed on 08/03/13, which showed diffuse geographic ground-glass attenuation in the lungs with areas of sparing. Radiologist interpretations suggested differential considerations of infection including atypical agents in this patient with immunosuppression and asymmetric/atypical pulmonary edema. Pulmonary was consulted the next day (08/04/13) and felt that given a lack of improvement on Abx and persistent hemoptysis a bronchoscopy was necessary. ID was also consulted on the same day and recommended sending bronchial washing cultures for PCP and TB in a known HIV patient. CD4 and viral load was also repeated at this time to  confirm effectiveness of his current ART regimen, which came back as 250 and undetectable, respectively. The patient was also  changed from AZT to Abacavir. At this point, ABx were discontinued as they were not resulting in improvement. Bronchial washings were negative for PCP and AFB smear and it was suspected that his lung findings were most likely d/t recurrent epistaxis and pneumonitis as nothing was really seen in lower airways on bronchoscopy. Infectious etiology was not suspected at this time. The patient proceeded to show clinical improvement on 08/05/13, with minimal hemoptysis and reduction in cough. CD4 was repeated and shown the be 300. Airborne isolation precautions were discontinued. The patient was discharged on 08/07/13 w/ minimal symptoms of cough and complete absence of hemoptysis. Patient is to follow up with [facility] clinic and ID clinic within 1-2 weeks of discharge.   2. ESRD- The patient has a known history of severe ADPKD. He had an AV graft placed in the past but had not decided if he wanted to pursue HD yet as he did not want to adhere to the consistent outpatient schedule and has a fear of needles. On admission, the patient's Cr was 11.0 (baseline 7-8), BUN 107, HCO3 of 14, and Na of 126. The patient still makes small amounts of urine. He was placed on a NaHCO3 150 meq infusion @ 75 ml/hr as per renal recs. The patient continued to be very reluctant to start HD even as his Cr worsened. Palliative Care was consulted to discuss goals of care with him and on 08/01/13, he decided to start HD and only removed 1L to start. Cr prior to first session of HD was 11.75. Patient started on Aranesp, Iron supplementation and Phoslo as per renal. Continued w/ HD throughout admission, w/ some days w/out HD d/t some bruising at graft site. On the day of discharge, Cr was 4.19, BUN 35, HCO3 25, and Na 135. Patient tolerated inpatient HD very well and  will continue w/ outpatient HD on MWF schedule.  Symptomatically, patient was much improved, saying he felt much better after starting dialysis.   3. Transaminitis- Patient presented w/ AST of 141, ALT of 75, and Albumin 2.9. He has a history of alcohol abuse in the past, but claims he had not had a drink for some time, he has significant hepatic cysts 2/2 severe ADPKD, and also takes several medications for HIV. All of these things are possible causes of elevated liver enzymes. On 08/01/13, the LFT's peaked at a moderately high level, AST of 430, ALT of 258, albumin of 2.4. At this time, azithromycin was discontinued as it is a known cause of hepatic complications. After azithromycin was d/c'ed, LFT's began to return to normal. On discharge, the patient had AST of 62 and ALT of 78.   4. HIV- Prior to admission, CD4 count and viral load were 580 and undetectable, respectively (05/2013). Brian [name] claims to be very compliant with his medications and took zidovudine, lamivudine, efavirenz prior to discharge. However, after clinical picture did not improve on HCAP Abx coverage, it was suspected that the patient may be failing his current regimen of HIV medications and an atypical pathogen was responsible for his lung findings, however AFB and PCP smears were both found to be negative. ID was consulted and changed his AZT to Abacavir. Prior to this change, CD4 count was repeated and was 250, significantly lower than previously in June. The next day, after the medications were switched, CD4 count was 300. Viral load still undetectable. Patient was discharged with a new regimen and scheduled for follow up with Dr. Linus Salmons.  5. Pancytopenia- Presented to the ED w/ WBC's of 6.1 (w/ PMN's of 85%), Hb of 6.8, MCV of 108.8, and platelets of 104. He was transfused 1 unit of PRBC's and Hb was 7.4. His Hb stayed relatively stable at this level for quite some time, until 08/04/13, when it was 6.6, and he received another unit of PRBC's in HD. He was also started on darbopoetin  as per the renal team. There were many theories for low cell levels, including failed HIV regimen, medication toxicity, liver and kidney disease, as well as possible bleeding. His AZT was switched to Abacavir by the ID team. He will folow up in the [facility] and have his CBC checked at that time. If his cell numbers are still low, it was recommended that he go for a heme/onc referral and discuss the possibility of a bone marrow biopsy to rule out a myelodysplasctic disorder.   5. HTN- Well controlled during this admission. No medications given for BP. Maintained well w/ initiation of HD.  Discharge Vitals:   BP 109/71  Pulse 121  Temp(Src) 98.4 F (36.9 C) (Oral)  Resp 18  Ht 6\' 3"  (1.905 m)  Wt 208 lb 8.9 oz (94.6 kg)  BMI 26.07 kg/m2  SpO2 96%  Discharge Labs:  Results for orders placed during the hospital encounter of 07/29/13 (from the past 24 hour(s))  PROTIME-INR     Status: Abnormal   Collection Time    08/07/13  5:00 AM      Result Value Range   Prothrombin Time 16.7 (*) 11.6 - 15.2 seconds   INR 1.39  0.00 - 1.49  CBC WITH DIFFERENTIAL     Status: Abnormal   Collection Time    08/07/13  5:00 AM      Result Value Range   WBC 3.4 (*) 4.0 - 10.5 K/uL  RBC 2.11 (*) 4.22 - 5.81 MIL/uL   Hemoglobin 7.6 (*) 13.0 - 17.0 g/dL   HCT 21.3 (*) 39.0 - 52.0 %   MCV 100.9 (*) 78.0 - 100.0 fL   MCH 36.0 (*) 26.0 - 34.0 pg   MCHC 35.7  30.0 - 36.0 g/dL   RDW 18.7 (*) 11.5 - 15.5 %   Platelets 78 (*) 150 - 400 K/uL   Neutrophils Relative % 64  43 - 77 %   Neutro Abs 2.2  1.7 - 7.7 K/uL   Lymphocytes Relative 24  12 - 46 %   Lymphs Abs 0.8  0.7 - 4.0 K/uL   Monocytes Relative 12  3 - 12 %   Monocytes Absolute 0.4  0.1 - 1.0 K/uL   Eosinophils Relative 0  0 - 5 %   Eosinophils Absolute 0.0  0.0 - 0.7 K/uL   Basophils Relative 0  0 - 1 %   Basophils Absolute 0.0  0.0 - 0.1 K/uL  COMPREHENSIVE METABOLIC PANEL     Status: Abnormal   Collection Time    08/07/13  5:00 AM      Result  Value Range   Sodium 135  135 - 145 mEq/L   Potassium 3.5  3.5 - 5.1 mEq/L   Chloride 99  96 - 112 mEq/L   CO2 25  19 - 32 mEq/L   Glucose, Bld 116 (*) 70 - 99 mg/dL   BUN 35 (*) 6 - 23 mg/dL   Creatinine, Ser 4.19 (*) 0.50 - 1.35 mg/dL   Calcium 7.9 (*) 8.4 - 10.5 mg/dL   Total Protein 5.4 (*) 6.0 - 8.3 g/dL   Albumin 1.9 (*) 3.5 - 5.2 g/dL   AST 62 (*) 0 - 37 U/L   ALT 78 (*) 0 - 53 U/L   Alkaline Phosphatase 73  39 - 117 U/L   Total Bilirubin 0.5  0.3 - 1.2 mg/dL   GFR calc non Af Amer 16 (*) >90 mL/min   GFR calc Af Amer 19 (*) >90 mL/min    Signed: Luanne Bras, MD 08/07/2013, 4:40 PM   Time Spent on Discharge: 35 minutes Services Ordered on Discharge: none Equipment Ordered on Discharge: none

## 2013-08-07 NOTE — Progress Notes (Signed)
Subjective: Brian [name] is a 37 y.o. man with history of HIV (followed by Dr. 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  Patient seen at bedside this AM. Feeling great today. Says he feels better than he has felt in a long time. No issues w/ his breathing w/ minimal cough. Denies SOB, fever, chills, nausea or vomiting. Okay for discharge today.  Objective: Vital signs in last 24 hours: Filed Vitals:   08/06/13 1747 08/06/13 2107 08/07/13 0608 08/07/13 0903  BP: 130/81 108/80 110/72 109/71  Pulse: 102 108 104 121  Temp: 97.6 F (36.4 C) 98.7 F (37.1 C) 98 F (36.7 C) 98.4 F (36.9 C)  TempSrc: Oral Oral Oral   Resp: 18 18 20 18   Height:  6\' 3"  (1.905 m)    Weight: 208 lb 8.9 oz (94.6 kg)     SpO2:  93% 93% 96%   Weight change: 14 lb 8.1 oz (6.581 kg)  Intake/Output Summary (Last 24 hours) at 08/07/13 1104 Last data filed at 08/07/13 [id number]  Gross per 24 hour  Intake    480 ml  Output   2676 ml  Net  -2196 ml   Physical Exam: General: Alert, cooperative, and in no apparent distress HEENT: Vision grossly intact, oropharynx clear and non-erythematous  Neck: Full range of motion without pain, supple, no lymphadenopathy or carotid bruits Lungs: Clear to ascultation bilaterally. Very mild crackles to auscultation of right lung.  Heart: Regular rate and rhythm, no murmurs, gallops, or rubs Abdomen: Distended, still with mild tenderness to palpation. Mass palpated in RLQ, most likely part of his PCK. Liver palpated on exam. Bowel sounds present. Has small amount of scrotal edema w/ mild erythema. Improving. Extremities: +1 pitting edema in the LE's. Better than yesterday. No cyanosis or clubbing. Neurologic: Alert & oriented X3, cranial nerves II-XII intact, strength grossly intact, sensation intact to light  touch.  Lab Results: Basic Metabolic Panel:  Recent Labs Lab 08/04/13 0828  08/06/13 1344 08/07/13 0500  NA 127*  < > 130* 135  K 3.7  < > 4.0 3.5  CL 90*  < > 93* 99  CO2 19  < > 21 25  GLUCOSE 110*  < > 143* 116*  BUN 64*  < > 57* 35*  CREATININE 6.49*  < > 6.14* 4.19*  CALCIUM 7.7*  < > 8.1* 7.9*  PHOS 5.3*  --  4.8*  --   < > = values in this interval not displayed. Liver Function Tests:  Recent Labs Lab 08/05/13 1105 08/06/13 1344 08/07/13 0500  AST 124*  --  62*  ALT 126*  --  78*  ALKPHOS 84  --  73  BILITOT 0.5  --  0.5  PROT 6.4  --  5.4*  ALBUMIN 2.2* 2.0* 1.9*   CBC:  Recent Labs Lab 08/05/13 1105 08/06/13 1344 08/07/13 0500  WBC 3.4* 3.3* 3.4*  NEUTROABS 2.6  --  2.2  HGB 8.4* 8.6* 7.6*  HCT 23.2* 24.0* 21.3*  MCV 97.1 98.4 100.9*  PLT 95* 86* 78*   Coagulation:  Recent Labs Lab 08/03/13 1914 08/04/13 0800 08/05/13 1105 08/07/13 0500  LABPROT 17.9* 16.2* 16.1* 16.7*  INR 1.52* 1.33 1.32 1.39   Urine Drug Screen: Drugs of Abuse     Component Value Date/Time   LABOPIA NEG  03/26/2013 1519   LABOPIA POSITIVE* 09/22/2010 1140   COCAINSCRNUR NEG 03/26/2013 1519   COCAINSCRNUR NONE DETECTED 09/22/2010 1140   LABBENZ NEG 03/26/2013 1519   LABBENZ NEG 07/30/2011 1656   LABBENZ POSITIVE* 09/22/2010 1140   AMPHETMU NEG 07/30/2011 1656   AMPHETMU NONE DETECTED 09/22/2010 1140   THCU NEG 03/26/2013 1519   THCU NONE DETECTED 09/22/2010 1140   LABBARB NEG 03/26/2013 1519   LABBARB  Value: NONE DETECTED        DRUG SCREEN FOR MEDICAL PURPOSES ONLY.  IF CONFIRMATION IS NEEDED FOR ANY PURPOSE, NOTIFY LAB WITHIN 5 DAYS.        LOWEST DETECTABLE LIMITS FOR URINE DRUG SCREEN Drug Class       Cutoff (ng/mL) Amphetamine      1000 Barbiturate      200 Benzodiazepine   A999333 Tricyclics       [id number] Opiates          300 Cocaine          300 THC              50 09/22/2010 1140    Micro Results: Recent Results (from the past 240 hour(s))  CULTURE, BLOOD (SINGLE)      Status: None   Collection Time    07/29/13  2:18 PM      Result Value Range Status   Organism ID, Bacteria NO GROWTH 5 DAYS   Final  MRSA PCR SCREENING     Status: None   Collection Time    07/29/13  6:15 PM      Result Value Range Status   MRSA by PCR NEGATIVE  NEGATIVE Final   Comment:            The GeneXpert MRSA Assay (FDA     approved for NASAL specimens     only), is one component of a     comprehensive MRSA colonization     surveillance program. It is not     intended to diagnose MRSA     infection nor to guide or     monitor treatment for     MRSA infections.  CULTURE, BLOOD (ROUTINE X 2)     Status: None   Collection Time    07/29/13 11:10 PM      Result Value Range Status   Specimen Description BLOOD LEFT ARM   Final   Special Requests BOTTLES DRAWN AEROBIC AND ANAEROBIC 10CC   Final   Culture  Setup Time     Final   Value: 07/30/2013 06:02     Performed at [facility]   Culture     Final   Value: NO GROWTH 5 DAYS     Performed at [facility]   Report Status 08/05/2013 FINAL   Final  CULTURE, BLOOD (ROUTINE X 2)     Status: None   Collection Time    07/29/13 11:16 PM      Result Value Range Status   Specimen Description BLOOD LEFT HAND   Final   Special Requests BOTTLES DRAWN AEROBIC AND ANAEROBIC 5CC   Final   Culture  Setup Time     Final   Value: 07/30/2013 06:02     Performed at [facility]   Culture     Final   Value: NO GROWTH 5 DAYS     Performed at [facility]   Report Status 08/05/2013 FINAL   Final  CULTURE, EXPECTORATED SPUTUM-ASSESSMENT  Status: None   Collection Time    07/30/13 12:45 AM      Result Value Range Status   Specimen Description SPUTUM   Final   Special Requests NONE   Final   Sputum evaluation     Final   Value: MICROSCOPIC FINDINGS SUGGEST THAT THIS SPECIMEN IS NOT REPRESENTATIVE OF LOWER RESPIRATORY SECRETIONS. PLEASE RECOLLECT.     CALLED TO MOORE,K RN 07/30/2013 0347 JORDANS   Report  Status 07/30/2013 FINAL   Final  CULTURE, EXPECTORATED SPUTUM-ASSESSMENT     Status: None   Collection Time    07/30/13  3:08 PM      Result Value Range Status   Specimen Description SPUTUM   Final   Special Requests NONE   Final   Sputum evaluation     Final   Value: MICROSCOPIC FINDINGS SUGGEST THAT THIS SPECIMEN IS NOT REPRESENTATIVE OF LOWER RESPIRATORY SECRETIONS. PLEASE RECOLLECT.     NOTIFIED A TONEY 1605 07/30/13 A BROWNING   Report Status 07/30/2013 FINAL   Final  AFB CULTURE WITH SMEAR     Status: None   Collection Time    07/30/13  3:08 PM      Result Value Range Status   Specimen Description SPUTUM   Final   Special Requests NONE   Final   ACID FAST SMEAR     Final   Value: NO ACID FAST BACILLI SEEN     Performed at [facility]   Culture     Final   Value: CULTURE WILL BE EXAMINED FOR 6 WEEKS BEFORE ISSUING A FINAL REPORT     Performed at [facility]   Report Status PENDING   Incomplete  CULTURE, EXPECTORATED SPUTUM-ASSESSMENT     Status: None   Collection Time    07/30/13  8:41 PM      Result Value Range Status   Specimen Description SPUTUM   Final   Special Requests NONE   Final   Sputum evaluation     Final   Value: MICROSCOPIC FINDINGS SUGGEST THAT THIS SPECIMEN IS NOT REPRESENTATIVE OF LOWER RESPIRATORY SECRETIONS. PLEASE RECOLLECT.     NOTIFIED R SPARKS RN 2235 07/30/13 A BROWNING   Report Status 07/30/2013 FINAL   Final  AFB CULTURE WITH SMEAR     Status: None   Collection Time    07/30/13  8:41 PM      Result Value Range Status   Specimen Description SPUTUM   Final   Special Requests Immunocompromised   Final   ACID FAST SMEAR     Final   Value: NO ACID FAST BACILLI SEEN     Performed at [facility]   Culture     Final   Value: CULTURE WILL BE EXAMINED FOR 6 WEEKS BEFORE ISSUING A FINAL REPORT     Performed at [facility]   Report Status PENDING   Incomplete  AFB CULTURE WITH SMEAR     Status: None   Collection Time     07/31/13  2:44 PM      Result Value Range Status   Specimen Description SPU   Final   Special Requests Immunocompromised   Final   ACID FAST SMEAR     Final   Value: NO ACID FAST BACILLI SEEN     Performed at [facility]   Culture     Final   Value: CULTURE WILL BE EXAMINED FOR 6 WEEKS BEFORE ISSUING A FINAL REPORT  Performed at [facility]   Report Status PENDING   Incomplete  CULTURE, RESPIRATORY (NON-EXPECTORATED)     Status: None   Collection Time    07/31/13  2:44 PM      Result Value Range Status   Specimen Description SPUTUM   Final   Special Requests NONE   Final   Gram Stain     Final   Value: RARE WBC PRESENT, PREDOMINANTLY PMN     FEW SQUAMOUS EPITHELIAL CELLS PRESENT     FEW GRAM POSITIVE COCCI IN CLUSTERS     FEW GRAM NEGATIVE RODS     Performed at [facility]   Culture     Final   Value: NORMAL OROPHARYNGEAL FLORA     Performed at [facility]   Report Status 08/03/2013 FINAL   Final  AFB CULTURE WITH SMEAR     Status: None   Collection Time    08/04/13  8:34 AM      Result Value Range Status   Specimen Description SPUTUM   Final   Special Requests NONE   Final   ACID FAST SMEAR     Final   Value: NO ACID FAST BACILLI SEEN     Performed at [facility]   Culture     Final   Value: CULTURE WILL BE EXAMINED FOR 6 WEEKS BEFORE ISSUING A FINAL REPORT     Performed at [facility]   Report Status PENDING   Incomplete  AFB CULTURE WITH SMEAR     Status: None   Collection Time    08/05/13  8:30 AM      Result Value Range Status   Specimen Description BRONCHIAL WASHINGS   Final   Special Requests RRL   Final   ACID FAST SMEAR     Final   Value: NO ACID FAST BACILLI SEEN     Performed at [facility]   Culture     Final   Value: CULTURE WILL BE EXAMINED FOR 6 WEEKS BEFORE ISSUING A FINAL REPORT     Performed at [facility]   Report Status PENDING   Incomplete  FUNGUS CULTURE W SMEAR      Status: None   Collection Time    08/05/13  8:30 AM      Result Value Range Status   Specimen Description BRONCHIAL WASHINGS   Final   Special Requests NONE   Final   Fungal Smear     Final   Value: NO YEAST OR FUNGAL ELEMENTS SEEN     Performed at [facility]   Culture     Final   Value: CULTURE IN PROGRESS FOR FOUR WEEKS     Performed at [facility]   Report Status PENDING   Incomplete  PNEUMOCYSTIS JIROVECI SMEAR BY DFA     Status: None   Collection Time    08/05/13  8:30 AM      Result Value Range Status   Specimen Source-PJSRC BRONCHIAL WASHINGS   Final   Pneumocystis jiroveci Ag NEGATIVE   Final   Comment: Performed at [place]  LEGIONELLA CULTURE     Status: None   Collection Time    08/05/13  8:30 AM      Result Value Range Status   Specimen Description BRONCHIAL WASHINGS   Final   Special Requests RRL   Final   Culture     Final  Value: NO LEGIONELLA ISOLATED, CULTURE IN PROGRESS FOR 5 DAYS     Performed at [facility]   Report Status PENDING   Incomplete  CULTURE, RESPIRATORY (NON-EXPECTORATED)     Status: None   Collection Time    08/05/13  8:30 AM      Result Value Range Status   Specimen Description BRONCHIAL WASHINGS RLL   Final   Special Requests NONE   Final   Gram Stain     Final   Value: FEW WBC PRESENT, PREDOMINANTLY PMN     FEW SQUAMOUS EPITHELIAL CELLS PRESENT     MODERATE GRAM POSITIVE COCCI IN PAIRS     IN CLUSTERS MODERATE GRAM NEGATIVE RODS     Performed at [facility]   Culture     Final   Value: Non-Pathogenic Oropharyngeal-type Flora Isolated.     Performed at [facility]   Report Status PENDING   Incomplete   Studies/Results: Dg Chest 2 View  08/07/2013   *RADIOLOGY REPORT*  Clinical Data: Follow up infiltrates  CHEST - 2 VIEW  Comparison: Chest  There are 07/29/2013 and chest CT, 08/03/2013.  Findings: Bilateral patchy airspace infiltrates, more notable on the right, but  also in the left mid and lower lung zone, are stable from the recent CT, but increased from prior chest radiograph.  More focal opacity in the left lateral lung base obscures the left lateral hemidiaphragm, likely atelectasis.  No pneumothorax.  The cardiac silhouette is mildly enlarged.  No mediastinal or hilar masses are noted.  IMPRESSION: Bilateral airspace lung opacities consistent with multifocal pneumonia.  These have increased from prior chest radiograph are stable from the more recent prior chest CT.  The left lateral lung base opacity is new from the prior CT, likely atelectasis.   Original Report Authenticated By: Lajean Manes, M.D.   Medications: I have reviewed the patient's current medications. Scheduled Meds: . abacavir  600 mg Oral Daily  . calcium acetate  1,334 mg Oral TID WC  . [START ON 08/09/2013] darbepoetin (ARANESP) injection - DIALYSIS  200 mcg Intravenous Q Mon-HD  . efavirenz  600 mg Oral QHS  . lamiVUDine  50 mg Oral Daily  . multivitamin  1 tablet Oral Daily  . nicotine  14 mg Transdermal QPC supper  . nystatin   Topical BID  . polyethylene glycol  17 g Oral Daily   Continuous Infusions:   PRN Meds:.sodium chloride, sodium chloride, sodium chloride, sodium chloride, albuterol, feeding supplement (NEPRO CARB STEADY), fentaNYL, heparin, heparin, ibuprofen, lidocaine (PF), lidocaine (PF), lidocaine, lidocaine-prilocaine, midazolam, ondansetron (ZOFRAN) IV, ondansetron, oxyCODONE, pentafluoroprop-tetrafluoroeth, phenylephrine, zolpidem Assessment/Plan:  #HCAP - History of acute development of productive cough with CXR showing lobar consolidation are most suggestive of CAP, however, the patient was recently hospitalized in 05/2013, therefore this must be treated as HCAP.  Borderline fevers noted (100.3-101.3 F) initially. Still w/out fevers overnight. For now, the patient is still in a negative pressure room. Seems improved today. AFB sputum smear negative x3. Blood culture  shows no growth so far. Legionella antigen and strep pneumo antigen -ve.  - Chest CT on 08/03/13 showed diffuse geographic ground-glass attenuation in the lungs with areas of sparing. The differential considerations include infection including atypical agents in this patient with immunosuppression and asymmetric/atypical pulmonary edema.  - Had inspection bronchoscopy on 8/21. Sent bronchial washings, PCP -ve, AFB smear -ve. Moved from negative pressure room last night. - Lung findings at this point most likely d/t pneumonitis in  response to bleeding.  - Sent for viral respiratory panel, still pending. - D/c'ed Vanco and cefipime on 08/04/13 as he was not improving on this regimen. Do not suspect MRSA or pseudomonal pna at this time. - Albuterol inhaler prn wheezing  - Oxycodone 5-10mg  q6h prn pain  - Zofran prn nausea   #Pancytopenia - Mr. Colocho has had low WBC's, platelets, and Hb since admission.  - Today, Hb is 7.6. Remains stable. Receiving darbopoetin w/ dialysis. - CT abdomen on 08/03/13 relatively unchanged from previous CT. No evidence of bleeding w/in the peritoneum.  - Patient was admitted w/ platelets of 104, today they are 49.  - Seen by ID yesterday, feel that pancytopenia may be d/t poor HIV control or myelosuppression. Consider Heme/onc referral as an outpatient if no improvement on new HIV regimen (see below).  #Transaminitis - Today, AST 62, ALT 78, TP 5.4, albumin 1.9, and Alk phos of 73. AST/ALT significantly improved. Transaminitis may have been 2/2 azithromycin use for HCAP. D/c'ed Azithromycin on 08/01/13. - Hepatitis serologies all negative. - Avoiding medications with tylenol.  #Electrolyte abnormalities- BMP improved since starting HD. First session of HD 08/01/13. For outpatient HD, starting Monday.  #Supratherapeutic INR - 1.39 today. - Okay to restart Coumadin on discharge.   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 BUN 35. Still making urine. First  session of HD 08/01/13, patient tolerated well.  Patient still w/ some LE edema, improved scrotal edema. Most likely contributed to by low albumin and decreased oncotic pressure. Patient still w/ bruising at fistula site. Will do HD on outpatient basis as per renal. Starting on Monday.  #HTN - BP stable. 109/71 this AM. - Holding home norvasc. Restart on discharge.  #HIV - Seen by ID. HIV regimen changed; Switched AZT to abacavir. Now on abacavir, efavirens, and lamivudine. - Sent for CD4 count and viral load, now 300 and undetectable, respectively. Improved from yesterday w/ new therapy. Will follow w/ ID. If no improvement w/ pancytopenia, ID suggested heme/onc consult for bone marrow biopsy.  Dispo: Patient much improved. Anticipated discharge today.   The patient does have a current PCP [name], MD) and does need an [facility] hospital follow-up appointment after discharge.  The patient does not have transportation limitations that hinder transportation to clinic appointments.  .Services Needed at time of discharge: Y = Yes, Blank = No PT: N  OT: N  RN: N  Equipment: N  Other: N    LOS: 9 days   Luanne Bras, MD 08/07/2013, 11:04 AM Pager: [phone]

## 2013-08-07 NOTE — Progress Notes (Signed)
[facility] Progress Note  Subjective:   Feeling pretty good. Ready to go home.- Has discharge papers in hand.  Knows to be at [facility] at 11 on Monday.  Objective Filed Vitals:   08/06/13 1747 08/06/13 2107 08/07/13 0608 08/07/13 0903  BP: 130/81 108/80 110/72 109/71  Pulse: 102 108 104 121  Temp: 97.6 F (36.4 C) 98.7 F (37.1 C) 98 F (36.7 C) 98.4 F (36.9 C)  TempSrc: Oral Oral Oral   Resp: 18 18 20 18   Height:  6\' 3"  (1.905 m)    Weight: 94.6 kg (208 lb 8.9 oz)     SpO2:  93% 93% 96%   Physical Exam General: Alert, cooperative, NAD Heart: RRR, Lungs: Decr at bases, no wheezes, rales, rhonchi Abdomen: Distended, tenderness mild but diffuse, normal BS Extremities: Dangling LE's, trace edema left/ 1+ on the right Dialysis Access: RUA AVF with local bruising/ + bruit  Assessment/Plan: 1. Cough/Fever- afebrile. Now s/p bronch and ID on board. Now determned to be pneumonitis- observing off antibiotics. Continuing to titrate volume down with dialysis.  2. ESRD - to be MWF at [facility] second shift. First HD 8/17, 1 L off. Second 8/18 again one liter off secondary to soft BP- tolerated well. Third HD tmt 8/20 with ~1.9L off. Fourth with 2600 off-still have a ways to go.  Of note, also has been sent to [facility] for possibility of transplant but financially he is not able to swing it so has taken himself out of consideration for now.  3. Anemia - Hgb was  improving, now  7.6 s/p transfusion on 8/14 and 8/20. (HIV?) received Aranesp 100 last week - dose increased to A999333, continue ferrlicit (0000000 x 5) Gi deferred invasive procedures. Continue to follow - has parameters for transfusion for hgb < 7. Max Epogen at discharge. 4. Secondary hyperparathyroidism - Ca+ 8.3. Phos improving 5.3 < 7.6 on Phoslo 2 ac. Last PTH was 262, no vitamin D as of yet  5. HTN/volume - SBPs 110s-120s, continue to challenge as able, serial weight lowering as OP. Srotal edema likely part of all of this, also  antifungal powder per ID  6. Nutrition - Alb 2.2, on renal diet, multivitamin 7. HIV- continue current therapy  8. Pancytopenia - WBC 6.1 on admission, now 3.4. Platelets 104 down to 95; Hgb 8.4 ? Related to HIV, ID recommends heme/onc consult if no further improvement 9. Transaminitis - trending down; serologies negative; follow  10. Elevated INR - trending down - not on coumadin- per primary team  11. Disp - outpt HD arranged MWF2 at [facility] on [facility]; from a renal perspective he is cleared for d/c   Brian [name] A  08/07/2013,9:22 AM  LOS: 9 days       Additional Objective Labs: Basic Metabolic Panel:  Recent Labs Lab 08/02/13 1056  08/04/13 0828 08/05/13 1105 08/06/13 1344 08/07/13 0500  NA 127*  < > 127* 132* 130* 135  K 3.2*  < > 3.7 4.0 4.0 3.5  CL 86*  < > 90* 93* 93* 99  CO2 23  < > 19 25 21 25   GLUCOSE 127*  < > 110* 126* 143* 116*  BUN 94*  < > 64* 45* 57* 35*  CREATININE 8.74*  < > 6.49* 5.09* 6.14* 4.19*  CALCIUM 7.5*  < > 7.7* 8.3* 8.1* 7.9*  PHOS 7.6*  --  5.3*  --  4.8*  --   < > = values in this interval not displayed. Liver  Function Tests:  Recent Labs Lab 08/03/13 1914  08/05/13 1105 08/06/13 1344 08/07/13 0500  AST 163*  --  124*  --  62*  ALT 149*  --  126*  --  78*  ALKPHOS 86  --  84  --  73  BILITOT 0.3  --  0.5  --  0.5  PROT 5.8*  --  6.4  --  5.4*  ALBUMIN 2.0*  < > 2.2* 2.0* 1.9*  < > = values in this interval not displayed. CBC:  Recent Labs Lab 08/03/13 1321 08/04/13 0826 08/05/13 1105 08/06/13 1344 08/07/13 0500  WBC 3.5* 2.9* 3.4* 3.3* 3.4*  NEUTROABS  --  2.3 2.6  --  2.2  HGB 7.9* 6.6* 8.4* 8.6* 7.6*  HCT 21.5* 18.3* 23.2* 24.0* 21.3*  MCV 98.6 99.5 97.1 98.4 100.9*  PLT 91* 79* 95* 86* 78*   Blood Culture    Component Value Date/Time   SDES BRONCHIAL WASHINGS 08/05/2013 0830   SDES BRONCHIAL WASHINGS 08/05/2013 0830   SDES BRONCHIAL WASHINGS 08/05/2013 0830   SDES BRONCHIAL WASHINGS RLL 08/05/2013 0830    SPECREQUEST RRL 08/05/2013 0830   SPECREQUEST NONE 08/05/2013 0830   SPECREQUEST RRL 08/05/2013 0830   SPECREQUEST NONE 08/05/2013 0830   CULT  Value: CULTURE WILL BE EXAMINED FOR 6 WEEKS BEFORE ISSUING A FINAL REPORT Performed at [facility] 08/05/2013 0830   CULT  Value: CULTURE IN PROGRESS FOR FOUR WEEKS Performed at [facility] 08/05/2013 0830   CULT  Value: NO LEGIONELLA ISOLATED, CULTURE IN PROGRESS FOR 5 DAYS Performed at [facility] 08/05/2013 0830   CULT  Value: Non-Pathogenic Oropharyngeal-type Flora Isolated. Performed at [facility] 08/05/2013 0830   REPTSTATUS PENDING 08/05/2013 0830   REPTSTATUS PENDING 08/05/2013 0830   REPTSTATUS PENDING 08/05/2013 0830   REPTSTATUS PENDING 08/05/2013 0830    Studies/Results: Dg Chest 2 View  08/07/2013   *RADIOLOGY REPORT*  Clinical Data: Follow up infiltrates  CHEST - 2 VIEW  Comparison: Chest  There are 07/29/2013 and chest CT, 08/03/2013.  Findings: Bilateral patchy airspace infiltrates, more notable on the right, but also in the left mid and lower lung zone, are stable from the recent CT, but increased from prior chest radiograph.  More focal opacity in the left lateral lung base obscures the left lateral hemidiaphragm, likely atelectasis.  No pneumothorax.  The cardiac silhouette is mildly enlarged.  No mediastinal or hilar masses are noted.  IMPRESSION: Bilateral airspace lung opacities consistent with multifocal pneumonia.  These have increased from prior chest radiograph are stable from the more recent prior chest CT.  The left lateral lung base opacity is new from the prior CT, likely atelectasis.   Original Report Authenticated By: Lajean Manes, M.D.   Medications:   . abacavir  600 mg Oral Daily  . calcium acetate  1,334 mg Oral TID WC  . [START ON 08/09/2013] darbepoetin (ARANESP) injection - DIALYSIS  200 mcg Intravenous Q Mon-HD  . efavirenz  600 mg Oral QHS  . lamiVUDine  50 mg Oral Daily  . multivitamin   1 tablet Oral Daily  . nicotine  14 mg Transdermal QPC supper  . nystatin   Topical BID  . polyethylene glycol  17 g Oral Daily

## 2013-08-07 NOTE — Progress Notes (Signed)
Pt was given his discharge orders with read back, fu care RX and education  Pt understood

## 2013-08-08 LAB — RESPIRATORY VIRUS PANEL
Influenza A H3: NOT DETECTED
Parainfluenza 1: NOT DETECTED
Respiratory Syncytial Virus A: NOT DETECTED
Respiratory Syncytial Virus B: NOT DETECTED
Rhinovirus: NOT DETECTED

## 2013-08-09 ENCOUNTER — Other Ambulatory Visit: Payer: Self-pay | Admitting: *Deleted

## 2013-08-09 ENCOUNTER — Ambulatory Visit: Payer: Medicaid Other | Admitting: Internal Medicine

## 2013-08-09 ENCOUNTER — Ambulatory Visit: Payer: Medicaid Other

## 2013-08-10 LAB — LEGIONELLA CULTURE

## 2013-08-12 ENCOUNTER — Encounter: Payer: Self-pay | Admitting: Internal Medicine

## 2013-08-12 ENCOUNTER — Ambulatory Visit (INDEPENDENT_AMBULATORY_CARE_PROVIDER_SITE_OTHER): Payer: Medicaid Other | Admitting: Internal Medicine

## 2013-08-12 VITALS — BP 133/83 | HR 120 | Temp 97.8°F | Wt 195.8 lb

## 2013-08-12 DIAGNOSIS — K439 Ventral hernia without obstruction or gangrene: Secondary | ICD-10-CM

## 2013-08-12 DIAGNOSIS — N186 End stage renal disease: Secondary | ICD-10-CM

## 2013-08-12 DIAGNOSIS — J189 Pneumonia, unspecified organism: Secondary | ICD-10-CM

## 2013-08-12 DIAGNOSIS — D72819 Decreased white blood cell count, unspecified: Secondary | ICD-10-CM | POA: Insufficient documentation

## 2013-08-12 DIAGNOSIS — D61818 Other pancytopenia: Secondary | ICD-10-CM

## 2013-08-12 MED ORDER — OXYCODONE-ACETAMINOPHEN 10-325 MG PO TABS: 1.0000 | ORAL_TABLET | Freq: Three times a day (TID) | ORAL | Status: DC | PRN
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
## 2013-08-12 NOTE — Assessment & Plan Note (Addendum)
Pt was d/c from [facility] for possible HCAP treated with vancomycin, azithromycin, and cefepime.  He currently feels much better and denies any fever/chills, N/V.  He reports having more energy and his cough and SOB are improving everyday.  He denies coughing up any blood.  On physical exam his lungs are CTAB.    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

## 2013-08-12 NOTE — Assessment & Plan Note (Addendum)
Pt reports increased abdominal pain since he lifted something heavy prior to his recent admission.  He had been referred to general surgery before but canceled the appointment due to moving.  He denies any N/V, fever/chills.  On physical exam, he has a ventral hernia and generalized tenderness to palpation with associated voluntary guarding.  He is wearing an abdominal binder.    -refer to general surgery, Dr. Lilyan Punt ([facility])

## 2013-08-12 NOTE — Assessment & Plan Note (Addendum)
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  His last cbc: Lab Results  Component Value Date   WBC 3.4* 08/07/2013   HGB 7.6* 08/07/2013   HCT 21.3* 08/07/2013   MCV 100.9* 08/07/2013   PLT 78* 08/07/2013   Possibilities include: direct effect of HIV, opportunistic infections, MDS.    -will recheck CBC

## 2013-08-12 NOTE — Assessment & Plan Note (Addendum)
Pt began HD during his recent admission and is on MWF.  Nephrology d/c his sodium bicarbonate.  He reports feeling much better after HD and is looking forward to going tomorrow.    -continue HD

## 2013-08-12 NOTE — Patient Instructions (Addendum)
Return to clinic in 1 month  1. We will check your blood count today 2. We will refer you to Dr. Lilyan Punt (general surgery) for your hernia

## 2013-08-13 LAB — CBC
Platelets: 123 10*3/uL — ABNORMAL LOW (ref 150–400)
RBC: 2.34 MIL/uL — ABNORMAL LOW (ref 4.22–5.81)
RDW: 18.3 % — ABNORMAL HIGH (ref 11.5–15.5)
WBC: 3.2 10*3/uL — ABNORMAL LOW (ref 4.0–10.5)

## 2013-08-16 NOTE — Progress Notes (Signed)
I saw and evaluated the patient. I personally confirmed the key portions of Dr. Ferd Glassing history and exam and reviewed pertinent patient test results. The assessment, diagnosis, and plan were formulated together and I agree with the documentation in the resident's note.  Brian [name] feels improved since discharge and his CBC is also slightly improved with regards to Hgb and plts.  WBC count has also remained stable.  Agree with repeat CXR in 1 month to assure resolution of bilateral infiltrates.

## 2013-08-17 ENCOUNTER — Ambulatory Visit: Payer: Medicaid Other

## 2013-08-18 ENCOUNTER — Telehealth: Payer: Self-pay | Admitting: *Deleted

## 2013-08-18 NOTE — Telephone Encounter (Signed)
Pt called with c/o coughing up blood tinged mucous, describes as red thick and slimy.  Onset 2 days ago, this am a little worse, some SOB.  Denies chest pain, fever. Pt  Hospitalized on 8/14 for Pneumonia, f/u in clinic on 8/28, doing better. Pt request antibiotics for above symptoms.  I offered an appointment today but he is leaving for dialysis now and will not return until 4:00. He also has appointment with ID tomorrow at 11:30.  Please advise Pt # [id number]

## 2013-08-18 NOTE — Telephone Encounter (Signed)
Pt scheduled in clinic tomorrow at 9:15.

## 2013-08-18 NOTE — Telephone Encounter (Signed)
Thank you :)

## 2013-08-18 NOTE — Telephone Encounter (Signed)
He is going to need to be seen by an MD before Abx are prescribed.  His history of pneumonia and hemoptysis are complicated and cannot be treated over the phone (I just re-read his discharge summary).  It was not clear what his hemoptysis was stemming from and it completely resolved on discharge from the hospital.  With new recurrence, he will need to be evaluated prior to any therapy being initiated.  He should definitely see ID tomorrow and possibly have an appointment with [place] as well if needed.

## 2013-08-19 ENCOUNTER — Ambulatory Visit (INDEPENDENT_AMBULATORY_CARE_PROVIDER_SITE_OTHER): Payer: Medicaid Other | Admitting: Internal Medicine

## 2013-08-19 ENCOUNTER — Encounter: Payer: Self-pay | Admitting: Internal Medicine

## 2013-08-19 ENCOUNTER — Ambulatory Visit
Admission: RE | Admit: 2013-08-19 | Discharge: 2013-08-19 | Disposition: A | Discharge status: Home / Self Care | Payer: Medicaid Other | Source: Ambulatory Visit | Attending: Internal Medicine | Admitting: Internal Medicine

## 2013-08-19 ENCOUNTER — Other Ambulatory Visit: Payer: Self-pay | Admitting: *Deleted

## 2013-08-19 ENCOUNTER — Ambulatory Visit (INDEPENDENT_AMBULATORY_CARE_PROVIDER_SITE_OTHER): Payer: Medicaid Other | Admitting: Pharmacist

## 2013-08-19 VITALS — BP 135/88 | HR 114 | Temp 97.8°F | Ht 75.0 in | Wt 187.0 lb

## 2013-08-19 VITALS — BP 134/88 | HR 108 | Temp 97.8°F | Ht 75.0 in | Wt 187.5 lb

## 2013-08-19 DIAGNOSIS — J189 Pneumonia, unspecified organism: Secondary | ICD-10-CM

## 2013-08-19 DIAGNOSIS — B20 Human immunodeficiency virus [HIV] disease: Secondary | ICD-10-CM

## 2013-08-19 DIAGNOSIS — R042 Hemoptysis: Secondary | ICD-10-CM

## 2013-08-19 DIAGNOSIS — N186 End stage renal disease: Secondary | ICD-10-CM

## 2013-08-19 DIAGNOSIS — I10 Essential (primary) hypertension: Secondary | ICD-10-CM

## 2013-08-19 DIAGNOSIS — K439 Ventral hernia without obstruction or gangrene: Secondary | ICD-10-CM

## 2013-08-19 DIAGNOSIS — Z7901 Long term (current) use of anticoagulants: Secondary | ICD-10-CM

## 2013-08-19 LAB — POCT INR: INR: 2.1

## 2013-08-19 MED ORDER — SULFAMETHOXAZOLE-TMP DS 800-160 MG PO TABS: 1.0000 | ORAL_TABLET | Freq: Every day | ORAL | Status: DC

## 2013-08-19 MED ORDER — SULFAMETHOXAZOLE-TMP DS 800-160 MG PO TABS: 1.0000 | ORAL_TABLET | Freq: Two times a day (BID) | ORAL | Status: DC

## 2013-08-19 MED ORDER — LAMIVUDINE 100 MG PO TABS: 50.0000 mg | ORAL_TABLET | Freq: Every day | ORAL | Status: DC

## 2013-08-19 NOTE — Assessment & Plan Note (Signed)
Patient now on HD. Is concerned about need for possible Boost Renal prescription.  I reviewed nutrition notes and did not see a recommendation from them during recent hospitalization.  Patient asked to ask Nephrologist for his recommendation about Boost renal needs and I would be happy to provide a prescription.

## 2013-08-19 NOTE — Assessment & Plan Note (Signed)
BP Readings from Last 3 Encounters:  08/19/13 135/88  08/19/13 134/88  08/12/13 133/83    Lab Results  Component Value Date   NA 135 08/07/2013   K 3.5 08/07/2013   CREATININE 4.19* 08/07/2013    Assessment: Blood pressure control: controlled Progress toward BP goal:  at goal Comments:   Plan: Medications:  continue current medications, amlodipine 10mg  daily. Educational resources provided:   Self management tools provided:   Other plans: Controlled continue current medications will follow up in 2 weeks.

## 2013-08-19 NOTE — Assessment & Plan Note (Signed)
He is on a renally adjusted regimen for dialysis. He will continue this and I will recheck his CD4 count and viral load today. He'll return in 4 months.

## 2013-08-19 NOTE — Assessment & Plan Note (Signed)
As patient has appointment later today with Dr. Linus Salmons, [place] will have him comment on patient's recent hemoptysis. Will follow up in 2 weeks.

## 2013-08-19 NOTE — Assessment & Plan Note (Signed)
This has not seemed to be from his lungs and I will send him to ENT to see if he has some nose bleed

## 2013-08-19 NOTE — Progress Notes (Signed)
I saw and evaluated the patient.  I personally confirmed the key portions of the history and exam documented by Dr. Hoffman and I reviewed pertinent patient test results.  The assessment, diagnosis, and plan were formulated together and I agree with the documentation in the resident's note. 

## 2013-08-19 NOTE — Patient Instructions (Addendum)
1. Please head over to the [facility] for Infectious Disease for you appointment. 2.  We will follow up with you after obtaining Infectious Disease Recommendations.

## 2013-08-19 NOTE — Assessment & Plan Note (Addendum)
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 I will start him on therapy for this with one double strength Bactrim twice a day. I will continue this for at least 3-6 months.   His chest x-ray was repeated today, it does, by my review, looked much improved.

## 2013-08-19 NOTE — Patient Instructions (Signed)
Patient instructed to take medications as defined in the Anti-coagulation Track section of this encounter.  Patient instructed to take today's dose.  Patient verbalized understanding of these instructions.    

## 2013-08-19 NOTE — Progress Notes (Signed)
Anti-Coagulation Progress Note  Brian [name] is a 37 y.o. male who is currently on an anti-coagulation regimen.    RECENT RESULTS: Recent results are below, the most recent result is correlated with a dose of 10mg  x 2 days; 7.5mg  x 2 days; 5mg --then seen today in [place].   Lab Results  Component Value Date   INR 2.10 08/19/2013   INR 1.39 08/07/2013   INR 1.32 08/05/2013    ANTI-COAG DOSE: Anticoagulation Dose Instructions as of 08/19/2013     Dorene Grebe Tue Wed Thu Fri Sat   New Dose 5 mg 5 mg 7.5 mg 5 mg 5 mg 7.5 mg 5 mg       ANTICOAG SUMMARY: Anticoagulation Episode Summary   Current INR goal    Next INR check 09/02/2013   INR from last check 2.10 (08/19/2013)   Weekly max dose    Target end date    INR check location    Preferred lab    Send INR reminders to       Comments         ANTICOAG TODAY: Anticoagulation Summary as of 08/19/2013   INR goal    Selected INR 2.10 (08/19/2013)   Next INR check 09/02/2013   Target end date     Anticoagulation Episode Summary   INR check location    Preferred lab    Send INR reminders to    Comments       PATIENT INSTRUCTIONS: Patient Instructions  Patient instructed to take medications as defined in the Anti-coagulation Track section of this encounter.  Patient instructed to take today's dose.  Patient verbalized understanding of these instructions.       FOLLOW-UP Return for Follow up INR at 2:15PM.  Jorene Guest, III Pharm.D., CACP

## 2013-08-19 NOTE — Assessment & Plan Note (Addendum)
HIV+ on antiretrovirals, recent hospitalization for PNA, treated for HCAP.  Patient has follow up today with Dr. Linus Salmons, [place].   Per Dr. 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

## 2013-08-19 NOTE — Progress Notes (Signed)
  Subjective:    Patient ID: Brian [name], male    DOB: 12-Jan-1976, 37 y.o.   MRN: [id number]  HPI He comes in here for followup after his recent hospitalization. I saw him last month prior to being hospitalized and he had not yet started dialysis despite the recommendations. He though developed some shortness of breath and didn't come to the hospital and was admitted for pneumonia. He did have significant shortness of breath and hemoptysis.  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 He was seen by his primary physician today for worsening cough again after his hospitalization in initial improvement. There is no etiology of his hemoptysis though it was suspected to be nasal more than thoracic. He has had no fever or chills. He is doing dialysis and feels better. He does have a followup appointment with surgery due to his ulcers. He is on a renally dosed regimen   Review of Systems  Constitutional: Negative for fever, chills, fatigue and unexpected weight change.  HENT: Negative for sore throat.   Eyes: Negative for visual disturbance.  Respiratory: Positive for cough and shortness of breath.   Cardiovascular: Negative for chest pain.  Gastrointestinal: Negative for nausea, abdominal pain and diarrhea.  Musculoskeletal: Negative for myalgias and arthralgias.  Skin: Negative for rash.  Neurological: Negative for dizziness, light-headedness and headaches.  Hematological: Negative for adenopathy.  Psychiatric/Behavioral: Negative for dysphoric mood.       Objective:   Physical Exam  Constitutional: He is oriented to person, place, and time. He appears well-developed and well-nourished. No distress.  HENT:  Mouth/Throat: No oropharyngeal exudate.  Eyes: Right eye exhibits no discharge. Left eye exhibits no discharge. No scleral icterus.  Cardiovascular: Normal rate, regular rhythm and normal heart sounds.   No  murmur heard. Pulmonary/Chest: Effort normal and breath sounds normal. No respiratory distress. He has no wheezes.  Lymphadenopathy:    He has no cervical adenopathy.  Neurological: He is alert and oriented to person, place, and time.  Skin: Skin is dry. No rash noted.  Psychiatric: He has a normal mood and affect. His behavior is normal.          Assessment & Plan:

## 2013-08-19 NOTE — Assessment & Plan Note (Signed)
He has followup with surgery
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
## 2013-08-20 LAB — HIV-1 RNA QUANT-NO REFLEX-BLD
HIV 1 RNA Quant: <20 copies/mL (ref <20)
HIV-1 RNA Quant, Log: <1.3 {Log} (ref <1.30)

## 2013-08-20 LAB — T-HELPER CELL (CD4) - (RCID CLINIC ONLY): CD4 T Cell Abs: 780 /uL (ref 400–2700)

## 2013-08-23 ENCOUNTER — Ambulatory Visit (INDEPENDENT_AMBULATORY_CARE_PROVIDER_SITE_OTHER): Payer: Medicaid Other | Admitting: Surgery

## 2013-08-23 ENCOUNTER — Ambulatory Visit: Payer: Medicaid Other

## 2013-08-23 ENCOUNTER — Encounter: Payer: Self-pay | Admitting: *Deleted

## 2013-08-23 NOTE — Telephone Encounter (Signed)
Phone note in error °

## 2013-08-24 ENCOUNTER — Ambulatory Visit: Payer: Medicaid Other | Admitting: Internal Medicine

## 2013-08-29 LAB — FUNGUS CULTURE W SMEAR

## 2013-08-31 ENCOUNTER — Other Ambulatory Visit: Payer: Self-pay | Admitting: *Deleted

## 2013-08-31 ENCOUNTER — Encounter: Payer: Self-pay | Admitting: *Deleted

## 2013-08-31 ENCOUNTER — Encounter: Payer: Self-pay | Admitting: Internal Medicine

## 2013-08-31 ENCOUNTER — Encounter: Payer: Medicaid Other | Attending: Internal Medicine | Admitting: *Deleted

## 2013-08-31 ENCOUNTER — Ambulatory Visit (INDEPENDENT_AMBULATORY_CARE_PROVIDER_SITE_OTHER): Payer: Medicaid Other | Admitting: Internal Medicine

## 2013-08-31 VITALS — BP 132/88 | HR 112 | Temp 98.0°F | Ht 75.0 in | Wt 179.0 lb

## 2013-08-31 VITALS — BP 130/81 | HR 103 | Temp 97.2°F | Ht 75.0 in | Wt 179.6 lb

## 2013-08-31 DIAGNOSIS — N186 End stage renal disease: Secondary | ICD-10-CM

## 2013-08-31 DIAGNOSIS — G4700 Insomnia, unspecified: Secondary | ICD-10-CM

## 2013-08-31 DIAGNOSIS — B20 Human immunodeficiency virus [HIV] disease: Secondary | ICD-10-CM

## 2013-08-31 DIAGNOSIS — R634 Abnormal weight loss: Secondary | ICD-10-CM | POA: Insufficient documentation

## 2013-08-31 DIAGNOSIS — J189 Pneumonia, unspecified organism: Secondary | ICD-10-CM

## 2013-08-31 DIAGNOSIS — R188 Other ascites: Secondary | ICD-10-CM

## 2013-08-31 DIAGNOSIS — Z7901 Long term (current) use of anticoagulants: Secondary | ICD-10-CM

## 2013-08-31 DIAGNOSIS — Z23 Encounter for immunization: Secondary | ICD-10-CM

## 2013-08-31 DIAGNOSIS — K469 Unspecified abdominal hernia without obstruction or gangrene: Secondary | ICD-10-CM

## 2013-08-31 MED ORDER — TRAZODONE HCL 50 MG PO TABS: 50.0000 mg | ORAL_TABLET | Freq: Every day | ORAL | Status: DC

## 2013-08-31 NOTE — Progress Notes (Signed)
  Medical Nutrition Therapy Appt start time: 1200  End time: 1300.  ASSESSMENT:  ESRD; Weight Loss (unintentional).   MEDICATIONS: See medication list. Reconciled with pt at time of visit   DIETARY INTAKE:  Usual eating pattern includes 3 meals and 0-1 snacks per day.  Pt reports attempts to avoid high phophorus/potassium/sodium foods. States he is cooking more. Decreased appetite reported.     Usual physical activity: None  Estimated energy needs: 2000 calories 225 g carbohydrates 150 g protein 56 g fat  Progress Towards Goal(s):  In progress.      Intervention:  Nutrition education.   Monitoring/Evaluation:  Dietary intake, exercise, and body weight prn.

## 2013-08-31 NOTE — Assessment & Plan Note (Addendum)
Currently under treatment by Dr. 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  Clinically improving and is less SOB.  Still has some hemoptysis, thought likely due to nasal source.

## 2013-08-31 NOTE — Patient Instructions (Signed)
Continue plan of Dr. Linus Salmons, they will call you about appointment for Ultrasound.

## 2013-08-31 NOTE — Assessment & Plan Note (Signed)
Still waiting for definitive diagnosis if no cardiac. He though is getting treated and is improving. This will be watched. I will have him followup in about 6 weeks.

## 2013-08-31 NOTE — Assessment & Plan Note (Signed)
His CD4 count is now back up to 780. Previous decrease was likely transient due to acute illness
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
## 2013-08-31 NOTE — Assessment & Plan Note (Signed)
He is now doing well on dialysis. Hopefully his muscle wasting and other issues will resolve as he continues on dialysis.
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
## 2013-08-31 NOTE — Assessment & Plan Note (Addendum)
Has appointment with Dr. Johney Maine, CCS on 09/07/13

## 2013-08-31 NOTE — Assessment & Plan Note (Signed)
Patient will likely need close followup of INR at this point due to antibiotics and questions of liver disease.  Has appointment for INR check with Dr. Elie Confer on Thursday.

## 2013-08-31 NOTE — Assessment & Plan Note (Signed)
Renally adjusted medications managed by Dr. Linus Salmons, ID, last CD4 count 780 on 08/19/13 and viral load undetectable.   Patient does complain of being unable to lay/sleep on right side and has some RUQ tenderness and mild distension of abdomen.  Per Dr. Henreitta Leber note today he is ordering a Abdominal U/S to evaluate for cirrhosis.  We do not have any labs of his Hep C status and he did have elevated Liver enzymes during his last admission.  He has received Hep B vaccination series as well as Hep A, and was likely tested before starting HD.  I will check a Hep C ab today.

## 2013-08-31 NOTE — Assessment & Plan Note (Signed)
He has had some weight loss and his abdomen does suggest ascites. I am most concerned with his liver and development of cirrhosis. His recent CT scan though did not suggest cirrhosis. I am though going to check an ultrasound to see if there is any worsening ascites or suggestion of cirrhosis. He does have a long history of alcohol abuse. He is on Coumadin so difficult to tell with INR. If he has some ascites or concerns for cirrhosis, I will consider sending him to the hepatology clinic.

## 2013-08-31 NOTE — Assessment & Plan Note (Addendum)
Reports he is doing very well with HD and feels better now that he started.  He reports his nephrologist has ordered Ensure supplements to be delivered to him, and he has spoken to a nutritionist about a dialysis diet.

## 2013-08-31 NOTE — Progress Notes (Signed)
  Subjective:    Patient ID: Brian [name], male    DOB: May 03, 1976, 37 y.o.   MRN: [id number]  HPI He comes in for followup of his nocardia infection. He was seen 2 weeks ago and had issues with cough, shortness of breath. He did have a culture of nocardia from his recent hospitalization when he also started dialysis.  His AFB culture does still say possible nocardia. He did start Bactrim and does feel better with his shortness of breath and cough. His repeat chest x-ray did show some persistent infiltrate and was organizing.  His hemoptysis is resolving. He does though have some mild discoloration that is pinkish. No fever, chills.  He also has noted more weight loss and feels like his muscles or wasting. He is eating protein. He did have a CAT scan during his recent hospitalization which showed a small amount of ascites but no cirrhosis was noted. His recent INR was noted and he is on Coumadin. Of note, he is now on Bactrim and will need closer followup of his Coumadin.   Review of Systems  Constitutional: Positive for unexpected weight change. Negative for fatigue.  HENT: Negative for sore throat and trouble swallowing.   Eyes: Negative for visual disturbance.  Respiratory: Negative for cough and shortness of breath.   Cardiovascular: Negative for chest pain.  Gastrointestinal: Negative for nausea and diarrhea.  Musculoskeletal: Negative for myalgias.  Skin: Negative for rash.  Neurological: Negative for dizziness, light-headedness and headaches.  Hematological: Negative for adenopathy.  Psychiatric/Behavioral: Negative for dysphoric mood.       Objective:   Physical Exam  Constitutional:  Thin He does have muscle wasting in his arms  Eyes: No scleral icterus.  Cardiovascular: Regular rhythm and normal heart sounds.   No murmur heard. tachycardic  Abdominal:  He does have increased abdominal girth suggestive of ascites  Lymphadenopathy:    He has no cervical adenopathy.   Skin: No rash noted.  Psychiatric: He has a normal mood and affect.          Assessment & Plan:

## 2013-09-01 NOTE — Progress Notes (Signed)
INTERNAL MEDICINE TEACHING ATTENDING ADDENDUM - Dominic Pea, DO, FACP: I personally saw and evaluated Brian [name] in this clinic visit in conjunction with the resident, Joni Reining, DO. I have discussed patient's plan of care with medical resident during this visit. I have confirmed the physical exam findings and have read and agree with the clinic note including the plan.

## 2013-09-02 ENCOUNTER — Ambulatory Visit (INDEPENDENT_AMBULATORY_CARE_PROVIDER_SITE_OTHER): Payer: Medicaid Other | Admitting: Pharmacist

## 2013-09-02 DIAGNOSIS — Z7901 Long term (current) use of anticoagulants: Secondary | ICD-10-CM

## 2013-09-02 LAB — POCT INR: INR: 2.6

## 2013-09-02 NOTE — Progress Notes (Signed)
Anti-Coagulation Progress Note  Brian [name] is a 37 y.o. male who is currently on an anti-coagulation regimen.    RECENT RESULTS: Recent results are below, the most recent result is correlated with a dose of 40 mg. per week: Lab Results  Component Value Date   INR 2.60 09/02/2013   INR 2.10 08/19/2013   INR 1.39 08/07/2013    ANTI-COAG DOSE: Anticoagulation Dose Instructions as of 09/02/2013     Dorene Grebe Tue Wed Thu Fri Sat   New Dose 5 mg 5 mg 5 mg 7.5 mg 5 mg 5 mg 5 mg       ANTICOAG SUMMARY: Anticoagulation Episode Summary   Current INR goal    Next INR check 09/14/2013   INR from last check 2.60 (09/02/2013)   Weekly max dose    Target end date    INR check location    Preferred lab    Send INR reminders to       Comments         ANTICOAG TODAY: Anticoagulation Summary as of 09/02/2013   INR goal    Selected INR 2.60 (09/02/2013)   Next INR check 09/14/2013   Target end date     Anticoagulation Episode Summary   INR check location    Preferred lab    Send INR reminders to    Comments       PATIENT INSTRUCTIONS: Patient Instructions  Patient instructed to take medications as defined in the Anti-coagulation Track section of this encounter.  Patient instructed to take today's dose.  Patient verbalized understanding of these instructions.       FOLLOW-UP Return in 12 days (on 09/14/2013) for Follow up INR at [place], III Pharm.D., CACP

## 2013-09-02 NOTE — Patient Instructions (Signed)
Patient instructed to take medications as defined in the Anti-coagulation Track section of this encounter.  Patient instructed to take today's dose.  Patient verbalized understanding of these instructions.    

## 2013-09-07 ENCOUNTER — Ambulatory Visit (INDEPENDENT_AMBULATORY_CARE_PROVIDER_SITE_OTHER): Payer: Medicaid Other | Admitting: Surgery

## 2013-09-08 ENCOUNTER — Other Ambulatory Visit: Payer: Self-pay | Admitting: *Deleted

## 2013-09-13 ENCOUNTER — Other Ambulatory Visit: Payer: Self-pay | Admitting: *Deleted

## 2013-09-14 ENCOUNTER — Encounter: Payer: Self-pay | Admitting: Infectious Diseases

## 2013-09-14 ENCOUNTER — Ambulatory Visit (INDEPENDENT_AMBULATORY_CARE_PROVIDER_SITE_OTHER): Payer: Medicaid Other | Admitting: Pharmacist

## 2013-09-14 DIAGNOSIS — Z7901 Long term (current) use of anticoagulants: Secondary | ICD-10-CM

## 2013-09-14 NOTE — Progress Notes (Signed)
Anti-Coagulation Progress Note  Brian [name] is a 37 y.o. male who is currently on an anti-coagulation regimen.    RECENT RESULTS: Recent results are below, the most recent result is correlated with a dose of 37.5 mg. per week: Lab Results  Component Value Date   INR 1.90 09/14/2013   INR 2.60 09/02/2013   INR 2.10 08/19/2013    ANTI-COAG DOSE: Anticoagulation Dose Instructions as of 09/14/2013     Dorene Grebe Tue Wed Thu Fri Sat   New Dose 5 mg 5 mg 7.5 mg 5 mg 5 mg 7.5 mg 5 mg       ANTICOAG SUMMARY: Anticoagulation Episode Summary   Current INR goal    Next INR check 10/05/2013   INR from last check 1.90 (09/14/2013)   Weekly max dose    Target end date    INR check location    Preferred lab    Send INR reminders to       Comments         ANTICOAG TODAY: Anticoagulation Summary as of 09/14/2013   INR goal    Selected INR 1.90 (09/14/2013)   Next INR check 10/05/2013   Target end date     Anticoagulation Episode Summary   INR check location    Preferred lab    Send INR reminders to    Comments       PATIENT INSTRUCTIONS: Patient Instructions  Patient instructed to take medications as defined in the Anti-coagulation Track section of this encounter.  Patient instructed to take today's dose.  Patient verbalized understanding of these instructions.       FOLLOW-UP Return in 3 weeks (on 10/05/2013) for Follow up INR at 1:45PM.  Jorene Guest, III Pharm.D., CACP

## 2013-09-14 NOTE — Patient Instructions (Signed)
Patient instructed to take medications as defined in the Anti-coagulation Track section of this encounter.  Patient instructed to take today's dose.  Patient verbalized understanding of these instructions.    

## 2013-09-15 MED ORDER — OXYCODONE-ACETAMINOPHEN 10-325 MG PO TABS: 1.0000 | ORAL_TABLET | Freq: Three times a day (TID) | ORAL | Status: DC | PRN

## 2013-09-15 NOTE — Telephone Encounter (Signed)
Rx given to pt. 

## 2013-09-16 ENCOUNTER — Ambulatory Visit (HOSPITAL_COMMUNITY)
Admission: RE | Admit: 2013-09-16 | Discharge: 2013-09-16 | Disposition: A | Discharge status: Home / Self Care | Payer: Medicaid Other | Source: Ambulatory Visit | Attending: Internal Medicine | Admitting: Internal Medicine

## 2013-09-16 DIAGNOSIS — K7460 Unspecified cirrhosis of liver: Secondary | ICD-10-CM | POA: Insufficient documentation

## 2013-09-16 DIAGNOSIS — Q613 Polycystic kidney, unspecified: Secondary | ICD-10-CM | POA: Insufficient documentation

## 2013-09-16 DIAGNOSIS — K7689 Other specified diseases of liver: Secondary | ICD-10-CM | POA: Insufficient documentation

## 2013-09-17 LAB — AFB CULTURE WITH SMEAR (NOT AT ARMC)

## 2013-09-17 LAB — ACID-FAST (MYCOBACTERIA) SMEAR AND CULTURE WITH REFLEX TO IDENTIFICATION: Acid Fast Smear: NONE SEEN

## 2013-09-20 LAB — AFB CULTURE WITH SMEAR (NOT AT ARMC)

## 2013-09-21 LAB — AFB CULTURE WITH SMEAR (NOT AT ARMC): Acid Fast Smear: 8881

## 2013-09-28 ENCOUNTER — Ambulatory Visit (INDEPENDENT_AMBULATORY_CARE_PROVIDER_SITE_OTHER): Payer: Medicaid Other | Admitting: Surgery

## 2013-10-05 ENCOUNTER — Ambulatory Visit (INDEPENDENT_AMBULATORY_CARE_PROVIDER_SITE_OTHER): Payer: Medicaid Other

## 2013-10-05 DIAGNOSIS — Z7901 Long term (current) use of anticoagulants: Secondary | ICD-10-CM

## 2013-10-11 ENCOUNTER — Telehealth: Payer: Self-pay | Admitting: Pharmacist

## 2013-10-11 NOTE — Telephone Encounter (Signed)
Indication: Recurrent thromboembolism.  Duration: Lifelong.  INR: At target.  Agree with Dr. Gladstone Pih assessment and plan.

## 2013-10-11 NOTE — Telephone Encounter (Signed)
Patient instructed to take medications as defined in the Anti-coagulation Track section of this encounter.  Patient instructed to take today's dose.  Patient verbalized understanding of these instructions.   Patient INR = 2.3 on 1&1/2 x 5mg  warfarin MWF; 5mg  warfarin all other days. Instructed by phone to continue this regimen and RTC in 3 weeks for repeat INR.

## 2013-10-12 ENCOUNTER — Ambulatory Visit (INDEPENDENT_AMBULATORY_CARE_PROVIDER_SITE_OTHER): Payer: Medicaid Other | Admitting: Surgery

## 2013-10-12 ENCOUNTER — Other Ambulatory Visit: Payer: Self-pay | Admitting: *Deleted

## 2013-10-12 DIAGNOSIS — B20 Human immunodeficiency virus [HIV] disease: Secondary | ICD-10-CM

## 2013-10-12 MED ORDER — BOOST PLUS PO LIQD: 237.0000 mL | Freq: Two times a day (BID) | ORAL | Status: DC

## 2013-10-15 ENCOUNTER — Other Ambulatory Visit: Payer: Self-pay | Admitting: *Deleted

## 2013-10-15 NOTE — Telephone Encounter (Signed)
Last refill 9/29 Pt # [phone]

## 2013-10-16 MED ORDER — OXYCODONE-ACETAMINOPHEN 10-325 MG PO TABS: 1.0000 | ORAL_TABLET | Freq: Three times a day (TID) | ORAL | Status: DC | PRN

## 2013-10-18 DIAGNOSIS — Z23 Encounter for immunization: Secondary | ICD-10-CM | POA: Diagnosis not present

## 2013-10-18 DIAGNOSIS — N186 End stage renal disease: Secondary | ICD-10-CM | POA: Diagnosis not present

## 2013-10-18 DIAGNOSIS — N2581 Secondary hyperparathyroidism of renal origin: Secondary | ICD-10-CM | POA: Diagnosis not present

## 2013-10-18 DIAGNOSIS — D631 Anemia in chronic kidney disease: Secondary | ICD-10-CM | POA: Diagnosis not present

## 2013-10-18 NOTE — Telephone Encounter (Signed)
Pt informed Rx is ready 

## 2013-10-19 ENCOUNTER — Other Ambulatory Visit: Payer: Self-pay | Admitting: Internal Medicine

## 2013-10-19 ENCOUNTER — Ambulatory Visit (INDEPENDENT_AMBULATORY_CARE_PROVIDER_SITE_OTHER): Payer: Medicare Other | Admitting: Internal Medicine

## 2013-10-19 ENCOUNTER — Encounter: Payer: Self-pay | Admitting: Internal Medicine

## 2013-10-19 ENCOUNTER — Other Ambulatory Visit: Payer: Self-pay | Admitting: Infectious Disease

## 2013-10-19 VITALS — BP 145/79 | HR 102 | Temp 97.8°F | Wt 182.0 lb

## 2013-10-19 DIAGNOSIS — Z79899 Other long term (current) drug therapy: Secondary | ICD-10-CM

## 2013-10-19 DIAGNOSIS — Z01810 Encounter for preprocedural cardiovascular examination: Secondary | ICD-10-CM

## 2013-10-19 DIAGNOSIS — Z113 Encounter for screening for infections with a predominantly sexual mode of transmission: Secondary | ICD-10-CM | POA: Diagnosis not present

## 2013-10-19 DIAGNOSIS — A310 Pulmonary mycobacterial infection: Secondary | ICD-10-CM

## 2013-10-19 DIAGNOSIS — B20 Human immunodeficiency virus [HIV] disease: Secondary | ICD-10-CM

## 2013-10-19 DIAGNOSIS — A318 Other mycobacterial infections: Secondary | ICD-10-CM

## 2013-10-19 NOTE — Assessment & Plan Note (Signed)
He is improving with his mycobacterial infection on Bactrim. He is now finished about 3 months and I will have him continue for about 6 months. In going to recheck his chest x-ray today.

## 2013-10-19 NOTE — Progress Notes (Signed)
  Subjective:    Patient ID: Brian [name], male    DOB: 1976/09/08, 37 y.o.   MRN: [id number]  HPI He comes in for followup of his pulmonary infection. He recently started dialysis due to his polycystic kidney disease after her along to lay from him not wanting to do it, and developed pneumonia during his hospitalization in August. Multiple sputums were taken and bronchoscopy and he did grow an AP positive organism. Initially it was preliminary read of Nocardia and he was started on Bactrim twice a day. He has done well in regards to his pulmonary symptoms and has noted marked improvement.  In particular, his cough and shortness of breath has greatly improved on this therapy. Since the initial reading, his AFB organism has now grown out mycobacterial fortuitum.     Review of Systems     Objective:   Physical Exam        Assessment & Plan:

## 2013-10-27 DIAGNOSIS — I82509 Chronic embolism and thrombosis of unspecified deep veins of unspecified lower extremity: Secondary | ICD-10-CM | POA: Diagnosis not present

## 2013-10-28 ENCOUNTER — Ambulatory Visit
Admission: RE | Admit: 2013-10-28 | Discharge: 2013-10-28 | Disposition: A | Discharge status: Home / Self Care | Payer: Medicare Other | Source: Ambulatory Visit | Attending: Internal Medicine | Admitting: Internal Medicine

## 2013-10-28 ENCOUNTER — Ambulatory Visit (INDEPENDENT_AMBULATORY_CARE_PROVIDER_SITE_OTHER): Payer: Medicare Other | Admitting: Surgery

## 2013-10-28 ENCOUNTER — Encounter (INDEPENDENT_AMBULATORY_CARE_PROVIDER_SITE_OTHER): Payer: Self-pay | Admitting: Surgery

## 2013-10-28 VITALS — BP 112/78 | HR 76 | Temp 97.4°F | Resp 16 | Ht 75.0 in | Wt 180.4 lb

## 2013-10-28 DIAGNOSIS — Z7901 Long term (current) use of anticoagulants: Secondary | ICD-10-CM

## 2013-10-28 DIAGNOSIS — K4090 Unilateral inguinal hernia, without obstruction or gangrene, not specified as recurrent: Secondary | ICD-10-CM

## 2013-10-28 DIAGNOSIS — Q446 Cystic disease of liver: Secondary | ICD-10-CM

## 2013-10-28 DIAGNOSIS — Z720 Tobacco use: Secondary | ICD-10-CM

## 2013-10-28 DIAGNOSIS — Q613 Polycystic kidney, unspecified: Secondary | ICD-10-CM

## 2013-10-28 DIAGNOSIS — K429 Umbilical hernia without obstruction or gangrene: Secondary | ICD-10-CM | POA: Diagnosis not present

## 2013-10-28 DIAGNOSIS — N186 End stage renal disease: Secondary | ICD-10-CM | POA: Diagnosis not present

## 2013-10-28 DIAGNOSIS — J984 Other disorders of lung: Secondary | ICD-10-CM | POA: Diagnosis not present

## 2013-10-28 DIAGNOSIS — M6200 Separation of muscle (nontraumatic), unspecified site: Secondary | ICD-10-CM

## 2013-10-28 DIAGNOSIS — F17200 Nicotine dependence, unspecified, uncomplicated: Secondary | ICD-10-CM

## 2013-10-28 DIAGNOSIS — M6208 Separation of muscle (nontraumatic), other site: Secondary | ICD-10-CM

## 2013-10-28 DIAGNOSIS — K439 Ventral hernia without obstruction or gangrene: Secondary | ICD-10-CM | POA: Insufficient documentation
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
## 2013-10-28 NOTE — Patient Instructions (Signed)
See the Handout(s) we gave you.  Consider surgery to repair 3-4 hernias above your bellybutton, at your bellybutton, in your left groin, and possibly your right groin.  This would require mesh..  Please call our office at [phone] if you wish to schedule surgery or if you have further questions / concerns.   If you wish to consider surgery, nor quark clearance from her primary care physician. STOP SMOKING!  We strongly recommend that you stop smoking.  Smoking increases the risk of surgery including infection in the form of an open wound, pus formation, abscess, hernia at an incision on the abdomen, etc.  You have an increased risk of other MAJOR complications such as stroke, heart attack, forming clots in the leg and/or lungs, and death.    While it can be one of the most difficult things to do, the [place] community has programs to help you stop.  Consider talking with your primary care physician about options.  Also, Smoking Cessation classes are available through the [facility]:  The smoking cessation program is a proven-effective program from the American [employer] Association. The program is available for anyone 54 and older who currently smokes. The program lasts for 7 weeks and is 8 sessions. Each class will be approximately 1 1/2 hours. The program is every Tuesday.  All classes are 12-1:30pm and same location.  Event Location Information:  Location: [place] 2nd Floor Conference Room 2-037; located next to [facility] cross streets: [place] Entrance into the [facility] is adjacent to the [employer] main entrance. The conference room is located on the 2nd floor.  Parking Instructions: Visitor parking is adjacent to [employer] main entrance and the [employer] [phone] or check the Classes and Support Groups   [email or web address] the event of  inclemet weather please call [phone] or view online at [email or web address]    Hernia A hernia occurs when an internal organ pushes out through a weak spot in the abdominal wall. Hernias most commonly occur in the groin and around the navel. Hernias often can be pushed back into place (reduced). Most hernias tend to get worse over time. Some abdominal hernias can get stuck in the opening (irreducible or incarcerated hernia) and cannot be reduced. An irreducible abdominal hernia which is tightly squeezed into the opening is at risk for impaired blood supply (strangulated hernia). A strangulated hernia is a medical emergency. Because of the risk for an irreducible or strangulated hernia, surgery may be recommended to repair a hernia. CAUSES   Heavy lifting.  Prolonged coughing.  Straining to have a bowel movement.  A cut (incision) made during an abdominal surgery. HOME CARE INSTRUCTIONS   Bed rest is not required. You may continue your normal activities.  Avoid lifting more than 10 pounds (4.5 kg) or straining.  Cough gently. If you are a smoker it is best to stop. Even the best hernia repair can break down with the continual strain of coughing. Even if you do not have your hernia repaired, a cough will continue to aggravate the problem.  Do not wear anything tight over your hernia. Do not try to keep it in with an outside bandage or truss. These can damage abdominal contents if they are trapped within the hernia sac.  Eat a normal diet.  Avoid constipation. Straining over long periods of time will increase hernia size and encourage breakdown of repairs.  If you cannot do this with diet alone, stool softeners may be used. SEEK IMMEDIATE MEDICAL CARE IF:   You have a fever.  You develop increasing abdominal pain.  You feel nauseous or vomit.  Your hernia is stuck outside the abdomen, looks discolored, feels hard, or is tender.  You have any changes in your bowel habits or in  the hernia that are unusual for you.  You have increased pain or swelling around the hernia.  You cannot push the hernia back in place by applying gentle pressure while lying down. MAKE SURE YOU:   Understand these instructions.  Will watch your condition.  Will get help right away if you are not doing well or get worse. Document Released: 12/02/2005 Document Revised: 02/24/2012 Document Reviewed: 07/21/2008 [facility] Patient Information 2014 [place].  Polycystic Kidney Disease Polycystic kidney disease is a disease in which the kidneys grow many small fluid-filled cysts. These cysts squeeze healthy kidney tissue, hurting the function of the kidneys. Polycystic kidney disease is present at birth and often causes progressive loss of kidney function and high blood pressure (hypertension). In milder forms of the disease, kidney function may be adequate throughout life.  CAUSES  The condition is usually inherited from parents. SIGNS AND SYMPTOMS  Hypertension.   Not enough healthy red blood cells to carry adequate oxygen to your tissues (anemia).   Pain in middle and side of the back below ribs (flank pain). This can happen if a cyst is bleeding.   Blood in the urine.   Kidney failure.   Kidney stones.   Increased urination at night.   Liver disease and liver cysts.  DIAGNOSIS  Your health care provider will ask you about your history and symptoms and perform a physical exam. Tests may be done to diagnose the condition. They may include an ultrasound, CT scan, or an MRI scan. Sometimes chromosome studies are done to see if you have the genes to have polycystic kidneys.  TREATMENT   There is no treatment at this time to keep cysts from forming or getting larger.  Cysts may need to be drained with a needle if they are large, putting pressure on other organs, and further destroying kidney tissue. This may require surgery.  Hypertension may be treated with  medicines. Treating hypertension slows down further damage to the kidneys.  If kidney failure occurs, dialysis or transplantation is used to treat the disease. HOME CARE INSTRUCTIONS You should follow up with a kidney specialist (nephrologist) so that your kidney function is monitored. SEEK MEDICAL CARE IF:  You have severe pain in the flank area.  You have blood in your urine. Document Released: 08/27/2001 Document Revised: 08/04/2013 Document Reviewed: 05/06/2013 [facility] Patient Information 2014 [place].

## 2013-10-29 ENCOUNTER — Telehealth: Payer: Self-pay | Admitting: *Deleted

## 2013-10-29 ENCOUNTER — Encounter: Payer: Self-pay | Admitting: Internal Medicine

## 2013-10-29 ENCOUNTER — Ambulatory Visit (INDEPENDENT_AMBULATORY_CARE_PROVIDER_SITE_OTHER): Payer: Medicare Other | Admitting: Internal Medicine

## 2013-10-29 VITALS — BP 132/79 | HR 115 | Temp 97.5°F | Ht 75.0 in | Wt 176.5 lb

## 2013-10-29 DIAGNOSIS — N186 End stage renal disease: Secondary | ICD-10-CM | POA: Diagnosis not present

## 2013-10-29 DIAGNOSIS — K429 Umbilical hernia without obstruction or gangrene: Secondary | ICD-10-CM | POA: Diagnosis not present

## 2013-10-29 DIAGNOSIS — A310 Pulmonary mycobacterial infection: Secondary | ICD-10-CM

## 2013-10-29 DIAGNOSIS — K6289 Other specified diseases of anus and rectum: Secondary | ICD-10-CM

## 2013-10-29 DIAGNOSIS — A630 Anogenital (venereal) warts: Secondary | ICD-10-CM | POA: Diagnosis not present

## 2013-10-29 DIAGNOSIS — A318 Other mycobacterial infections: Secondary | ICD-10-CM

## 2013-10-29 DIAGNOSIS — B20 Human immunodeficiency virus [HIV] disease: Secondary | ICD-10-CM

## 2013-10-29 NOTE — Assessment & Plan Note (Signed)
It is well controlled. His CD4 was 780 and viral load less than 20 on 08/19/13. Patient had vaccination for hepatitis B and hepatitis A. His hepatitis C antibody was negative on 08/31/13. Will continue current regimen.
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
## 2013-10-29 NOTE — Assessment & Plan Note (Signed)
Patient has been followed up by Dr. Linus Salmons. Dr. Linus Salmons wants to treat the patient with Bactrim for 6 more months. His chest x-ray showing improvement without new infiltration. His symptoms improved significantly. He does not have chest pain, fever or chills today. Will continue current regimen and followup with Dr. Linus Salmons.

## 2013-10-29 NOTE — Assessment & Plan Note (Signed)
Patient is on dialysis. He is compliant to hemodialysis. No new issues.

## 2013-10-29 NOTE — Telephone Encounter (Signed)
Call from [place] reporting pt's INR of 1.69 on 11/12. Pt on coumadin 5 mg Tue, Thur, Sat and Sun. He takes coumadin 7.5 mg Mon, Wed and Friday.   Pt increased coumadin on thur to  7.5 per PA @ center and also to increase on Saturday to 7.5 then to return to normal schedule.  Please advise on this.  # [id number]  Pt has a scheduled appointment today - will address at that time.

## 2013-10-29 NOTE — Patient Instructions (Signed)
1.as you promised, please ask Dr. Remo Lipps and Dr. Linus Salmons for second opinions for your anal masses. It does not look like hemorrhoid to me. 2. Please take all medications as prescribed.  3. If you have worsening of your symptoms or new symptoms arise, please call the clinic [phone]), or go to the ER immediately if symptoms are severe.  You have done great job in taking all your medications. I appreciate it very much. Please continue doing that.

## 2013-10-29 NOTE — Assessment & Plan Note (Addendum)
Patient has many tiny masses in perirectal area. It does not look like hemorrhoid. Looks like warts, but I am not very sure. He has been treated for possible hemorrhoid with Anusol suppository without significant help. Since patient is going to be followed up by surgeon, Dr. Remo Lipps for his ventral hernia, I suggested patient to ask Dr. Remo Lipps for second opinion. He agreed to do so. I also suggested patient to ask Dr.Comer when he sees Dr. Linus Salmons in January.

## 2013-10-29 NOTE — Telephone Encounter (Signed)
I talked with Dr Elie Confer and informed him of INR from today. Dr Elie Confer will call pt on Monday.  He agreed with plan suggested by PA at [facility].

## 2013-11-01 ENCOUNTER — Other Ambulatory Visit: Payer: Self-pay | Admitting: Licensed Clinical Social Worker

## 2013-11-01 ENCOUNTER — Telehealth: Payer: Self-pay | Admitting: *Deleted

## 2013-11-01 DIAGNOSIS — R0689 Other abnormalities of breathing: Secondary | ICD-10-CM

## 2013-11-01 NOTE — Telephone Encounter (Signed)
Sent referral to [facility] per Dr. Linus Salmons.  Pt given appointment for 11/20 at 10:30 with Dr. Meda Coffee.  Pt notified, stated he still wasn't sure if he wanted to have the procedure.  Pt stated he still needed clearance from his PCP Dr. Algis Liming and his nephrologist.  Will forward the request to their attention.  As this patient is [place], Dr. Gwenlyn Perking office will have to continue the cardiology referral.   Patient also noted that he was having recurrent hemorrhoid-like eruptions and would like to speak with Dr. Linus Salmons regarding this.  Pt given appointment for 11/18 at 4:15.

## 2013-11-01 NOTE — Addendum Note (Signed)
Addended by: Landis Gandy on: 11/01/2013 09:22 AM   Modules accepted: Orders

## 2013-11-02 ENCOUNTER — Other Ambulatory Visit: Payer: Self-pay | Admitting: *Deleted

## 2013-11-02 ENCOUNTER — Ambulatory Visit: Payer: Medicare Other | Admitting: Internal Medicine

## 2013-11-02 NOTE — Progress Notes (Signed)
Case discussed with Dr. Blaine Hamper at the time of the visit.  We reviewed the resident's history and exam and pertinent patient test results.  I agree with the assessment, diagnosis, and plan of care documented in the resident's note.

## 2013-11-03 ENCOUNTER — Telehealth: Payer: Self-pay | Admitting: *Deleted

## 2013-11-03 DIAGNOSIS — Z86718 Personal history of other venous thrombosis and embolism: Secondary | ICD-10-CM | POA: Diagnosis not present

## 2013-11-03 NOTE — Telephone Encounter (Signed)
Vaughan Basta, chg nurse from dialysis calls and states pt told her he had not heard from the clinic about his coumadin dosing or pt/ inr. Spoke w/ dr Elie Confer he is agreeable for the dialysis ctr to do these and call him the results, when linda was called back she stated that it will take at least 48hrs to get pt/ inr results. The pt was then called and ask to come in Friday am to have pt, inr done and will call rwesults to dr groce for dosing, pt was reluctant but did agree to come to clinic then go to dialysis

## 2013-11-04 ENCOUNTER — Institutional Professional Consult (permissible substitution): Payer: Medicare Other | Admitting: Cardiology

## 2013-11-04 MED ORDER — ABACAVIR SULFATE 300 MG PO TABS: 600.0000 mg | ORAL_TABLET | Freq: Every day | ORAL | Status: DC

## 2013-11-04 MED ORDER — ALBUTEROL SULFATE HFA 108 (90 BASE) MCG/ACT IN AERS: 2.0000 | INHALATION_SPRAY | Freq: Four times a day (QID) | RESPIRATORY_TRACT | Status: DC | PRN

## 2013-11-05 ENCOUNTER — Ambulatory Visit (INDEPENDENT_AMBULATORY_CARE_PROVIDER_SITE_OTHER): Payer: Medicare Other | Admitting: Pharmacist

## 2013-11-05 DIAGNOSIS — Z7901 Long term (current) use of anticoagulants: Secondary | ICD-10-CM | POA: Diagnosis not present

## 2013-11-05 DIAGNOSIS — Z86711 Personal history of pulmonary embolism: Secondary | ICD-10-CM | POA: Diagnosis not present

## 2013-11-05 DIAGNOSIS — A310 Pulmonary mycobacterial infection: Secondary | ICD-10-CM | POA: Diagnosis not present

## 2013-11-05 LAB — POCT INR: INR: 2.3

## 2013-11-05 NOTE — Progress Notes (Signed)
Anti-Coagulation Progress Note  Brian [name] is a 37 y.o. male who is currently on an anti-coagulation regimen.    RECENT RESULTS: Recent results are below, the most recent result is correlated with a dose of 42.5 mg. per week: Lab Results  Component Value Date   INR 2.3 11/05/2013   INR 2.3 10/05/2013   INR 1.90 09/14/2013    ANTI-COAG DOSE: Anticoagulation Dose Instructions as of 11/05/2013     Dorene Grebe Tue Wed Thu Fri Sat   New Dose 5 mg 7.5 mg 5 mg 7.5 mg 5 mg 7.5 mg 5 mg       ANTICOAG SUMMARY: Anticoagulation Episode Summary   Current INR goal    Next INR check 11/26/2013   INR from last check 2.3 (11/05/2013)   Weekly max dose    Target end date    INR check location    Preferred lab    Send INR reminders to       Comments         ANTICOAG TODAY: Anticoagulation Summary as of 11/05/2013   INR goal    Selected INR 2.3 (11/05/2013)   Next INR check 11/26/2013   Target end date     Anticoagulation Episode Summary   INR check location    Preferred lab    Send INR reminders to    Comments       PATIENT INSTRUCTIONS: Patient Instructions  Patient instructed to take medications as defined in the Anti-coagulation Track section of this encounter.  Patient instructed to take today's dose.  Patient verbalized understanding of these instructions.       FOLLOW-UP Return in 3 weeks (on 11/26/2013) for Follow up INR at 1:45PM.  Jorene Guest, III Pharm.D., CACP

## 2013-11-05 NOTE — Patient Instructions (Signed)
Patient instructed to take medications as defined in the Anti-coagulation Track section of this encounter.  Patient instructed to take today's dose.  Patient verbalized understanding of these instructions.    

## 2013-11-09 ENCOUNTER — Encounter: Payer: Self-pay | Admitting: Internal Medicine

## 2013-11-09 ENCOUNTER — Ambulatory Visit (INDEPENDENT_AMBULATORY_CARE_PROVIDER_SITE_OTHER): Payer: Medicare Other | Admitting: Internal Medicine

## 2013-11-09 VITALS — BP 132/75 | HR 84 | Temp 97.8°F | Ht 75.0 in | Wt 179.0 lb

## 2013-11-09 DIAGNOSIS — A318 Other mycobacterial infections: Secondary | ICD-10-CM

## 2013-11-09 DIAGNOSIS — A630 Anogenital (venereal) warts: Secondary | ICD-10-CM

## 2013-11-09 DIAGNOSIS — A310 Pulmonary mycobacterial infection: Secondary | ICD-10-CM

## 2013-11-09 DIAGNOSIS — K429 Umbilical hernia without obstruction or gangrene: Secondary | ICD-10-CM

## 2013-11-09 NOTE — Assessment & Plan Note (Addendum)
I discussed concerns of hernia after appt with Dr. Johney Maine.  I would like to have cardiology pre operative input and will send him for that (appt Dec 9th) due to recent PE, ability to hold coumadin, any question of CAD, ? If needs a stress test, echo.  He is ameneable for surgery and understands need better after our discussion.  After that appt, he will reschedule with surgery.  His SOB has resolved and no current pulmonary issues, though understands smoking increases his risk of complications.

## 2013-11-09 NOTE — Assessment & Plan Note (Signed)
C/w condyloma.  Will set him up with our [facility] clinic.  He may need surgery input for this as well after anal pap smear.

## 2013-11-09 NOTE — Assessment & Plan Note (Signed)
Now asymptomatic.  CXR shows resolution.  Will continue course through 01/19/14 and stop.

## 2013-11-09 NOTE — Progress Notes (Signed)
  Subjective:    Patient ID: Brian [name], male    DOB: November 02, 1976, 37 y.o.   MRN: [id number]  HPI He is here for a work in visit.  He was seen recently by IM and found a genital wart.  Also recently seen by surgery for hernias and recommended repair with mesh. He wanted to discuss surgery and anal warts.  On dialysis MWF.  SOB good and CXR showed resolution of lesion.     Review of Systems  Constitutional: Negative for fever, appetite change and fatigue.  HENT: Negative for trouble swallowing.   Respiratory: Negative for cough, shortness of breath and wheezing.   Cardiovascular: Negative for chest pain and leg swelling.  Gastrointestinal: Negative for nausea and diarrhea.  Musculoskeletal: Negative for back pain.  Skin: Negative for rash.  Neurological: Negative for dizziness, light-headedness and headaches.  Hematological: Negative for adenopathy.  Psychiatric/Behavioral: Negative for dysphoric mood.       Objective:   Physical Exam  Constitutional: He is oriented to person, place, and time. He appears well-developed and well-nourished. No distress.  HENT:  Mouth/Throat: No oropharyngeal exudate.  Eyes: Right eye exhibits no discharge. Left eye exhibits no discharge. No scleral icterus.  Genitourinary:  Perianal warts, more prominent now more than previous  Lymphadenopathy:    He has no cervical adenopathy.  Neurological: He is alert and oriented to person, place, and time.  Skin: No rash noted.  Psychiatric: He has a normal mood and affect. His behavior is normal.          Assessment & Plan:

## 2013-11-10 DIAGNOSIS — I82509 Chronic embolism and thrombosis of unspecified deep veins of unspecified lower extremity: Secondary | ICD-10-CM | POA: Diagnosis not present

## 2013-11-14 DIAGNOSIS — N186 End stage renal disease: Secondary | ICD-10-CM | POA: Diagnosis not present

## 2013-11-15 ENCOUNTER — Telehealth: Payer: Self-pay | Admitting: Pharmacist

## 2013-11-15 DIAGNOSIS — I749 Embolism and thrombosis of unspecified artery: Secondary | ICD-10-CM | POA: Diagnosis not present

## 2013-11-15 DIAGNOSIS — Z23 Encounter for immunization: Secondary | ICD-10-CM | POA: Diagnosis not present

## 2013-11-15 DIAGNOSIS — D509 Iron deficiency anemia, unspecified: Secondary | ICD-10-CM | POA: Diagnosis not present

## 2013-11-15 DIAGNOSIS — N2581 Secondary hyperparathyroidism of renal origin: Secondary | ICD-10-CM | POA: Diagnosis not present

## 2013-11-15 DIAGNOSIS — D631 Anemia in chronic kidney disease: Secondary | ICD-10-CM | POA: Diagnosis not present

## 2013-11-15 DIAGNOSIS — N186 End stage renal disease: Secondary | ICD-10-CM | POA: Diagnosis not present

## 2013-11-15 NOTE — Telephone Encounter (Signed)
Was delivered a fax copy of PT/INR results collected on 28-NOV-14 at [place]. Results just made available to me today at approximately 5PM. I called patient and spoke with him and advised we needed to increase the dose--but that this action is predicated upon a test result collected on 28-NOV-14 and just made available to me today at [place]. He is a MWF HD patient and this interferes with his ability to come to my Clinic which is a Monday Clinic. He has an option of seeing me on an "off" clinic day provided we mutually agree that I can be present (not out of hospital at [place]). He understands. He has been advised to increase his dose to 1 and 1/2 tablets (5mg ) [7.5mg  dose] M-F; 1x5mg  tablet on Saturday/Sunday. Next INR on Tuesday 16-DEC-14 here in [facility].

## 2013-11-16 ENCOUNTER — Other Ambulatory Visit: Payer: Self-pay | Admitting: *Deleted

## 2013-11-17 MED ORDER — OXYCODONE-ACETAMINOPHEN 10-325 MG PO TABS: 1.0000 | ORAL_TABLET | Freq: Three times a day (TID) | ORAL | Status: DC | PRN

## 2013-11-17 NOTE — Telephone Encounter (Signed)
Tried to call pt for pick up, no answer, left message to rtc, not specific

## 2013-11-22 ENCOUNTER — Encounter: Payer: Self-pay | Admitting: *Deleted

## 2013-11-22 DIAGNOSIS — I749 Embolism and thrombosis of unspecified artery: Secondary | ICD-10-CM | POA: Diagnosis not present

## 2013-11-23 ENCOUNTER — Ambulatory Visit (INDEPENDENT_AMBULATORY_CARE_PROVIDER_SITE_OTHER): Payer: Medicare Other | Admitting: Cardiology

## 2013-11-23 ENCOUNTER — Encounter: Payer: Self-pay | Admitting: Cardiology

## 2013-11-23 VITALS — BP 124/76 | HR 78 | Ht 75.0 in | Wt 177.0 lb

## 2013-11-23 DIAGNOSIS — I719 Aortic aneurysm of unspecified site, without rupture: Secondary | ICD-10-CM

## 2013-11-23 DIAGNOSIS — R0602 Shortness of breath: Secondary | ICD-10-CM | POA: Diagnosis not present

## 2013-11-23 DIAGNOSIS — R9431 Abnormal electrocardiogram [ECG] [EKG]: Secondary | ICD-10-CM | POA: Insufficient documentation

## 2013-11-23 NOTE — Patient Instructions (Signed)
Your physician recommends that you schedule a follow-up appointment in:1 [place], AFTER TESTING DONE   Your physician has requested that you have en exercise stress myoview. For further information please visit [email or web address]. Please follow instruction sheet, as given.   Your physician has requested that you have an echocardiogram. Echocardiography is a painless test that uses sound waves to create images of your heart. It provides your doctor with information about the size and shape of your heart and how well your heart's chambers and valves are working. This procedure takes approximately one hour. There are no restrictions for this procedure.
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
## 2013-11-24 DIAGNOSIS — I82509 Chronic embolism and thrombosis of unspecified deep veins of unspecified lower extremity: Secondary | ICD-10-CM | POA: Diagnosis not present

## 2013-11-25 ENCOUNTER — Telehealth: Payer: Self-pay | Admitting: *Deleted

## 2013-11-25 NOTE — Telephone Encounter (Signed)
Received fax from [place] on [address] [phone]) - PT 24.2 and INR 2.29 drawn on 11/22/13; message sent to Dr Elie Confer via text message of results. Copy of results placed in Dr Gwenlyn Perking box. Thanks

## 2013-11-25 NOTE — Telephone Encounter (Signed)
Agree, thanks

## 2013-11-25 NOTE — Telephone Encounter (Signed)
Called by Holley Raring, RN - Triage Nurse today at [facility]. She reports that a venous drawn sample for PT/INR collected 11-DEC-14 at HD center reveals INR = 2.29. He currently takes 5mg  strength warfarin tablets as 1&1/2 tablets (7.5mg ) M-F; 5mg  only on Saturdays/Sundays. He has been called and advised to continue this regimen and have the HD RN's re-collect PT/INR on December 14, 2013 at his HD session for that day.

## 2013-11-29 DIAGNOSIS — I749 Embolism and thrombosis of unspecified artery: Secondary | ICD-10-CM | POA: Diagnosis not present

## 2013-12-05 DIAGNOSIS — I749 Embolism and thrombosis of unspecified artery: Secondary | ICD-10-CM | POA: Diagnosis not present

## 2013-12-14 ENCOUNTER — Encounter (HOSPITAL_COMMUNITY): Payer: Medicare Other

## 2013-12-14 ENCOUNTER — Other Ambulatory Visit (HOSPITAL_COMMUNITY): Payer: Medicare Other

## 2013-12-15 DIAGNOSIS — N186 End stage renal disease: Secondary | ICD-10-CM | POA: Diagnosis not present

## 2013-12-17 DIAGNOSIS — N2581 Secondary hyperparathyroidism of renal origin: Secondary | ICD-10-CM | POA: Diagnosis not present

## 2013-12-17 DIAGNOSIS — N186 End stage renal disease: Secondary | ICD-10-CM | POA: Diagnosis not present

## 2013-12-17 DIAGNOSIS — D509 Iron deficiency anemia, unspecified: Secondary | ICD-10-CM | POA: Diagnosis not present

## 2013-12-17 DIAGNOSIS — D631 Anemia in chronic kidney disease: Secondary | ICD-10-CM | POA: Diagnosis not present

## 2013-12-17 DIAGNOSIS — Z23 Encounter for immunization: Secondary | ICD-10-CM | POA: Diagnosis not present

## 2013-12-20 ENCOUNTER — Other Ambulatory Visit: Payer: Self-pay | Admitting: *Deleted

## 2013-12-20 DIAGNOSIS — I749 Embolism and thrombosis of unspecified artery: Secondary | ICD-10-CM | POA: Diagnosis not present

## 2013-12-20 MED ORDER — OXYCODONE-ACETAMINOPHEN 10-325 MG PO TABS: 1.0000 | ORAL_TABLET | Freq: Three times a day (TID) | ORAL | Status: DC | PRN

## 2013-12-20 NOTE — Telephone Encounter (Signed)
Last filled 12/2 Call when ready @ [phone]

## 2013-12-21 ENCOUNTER — Ambulatory Visit (HOSPITAL_COMMUNITY): Payer: Medicare Other | Attending: Cardiology | Admitting: Radiology

## 2013-12-21 ENCOUNTER — Other Ambulatory Visit: Payer: Medicare Other

## 2013-12-21 ENCOUNTER — Encounter: Payer: Self-pay | Admitting: Cardiology

## 2013-12-21 DIAGNOSIS — R0602 Shortness of breath: Secondary | ICD-10-CM | POA: Insufficient documentation

## 2013-12-21 DIAGNOSIS — Z86718 Personal history of other venous thrombosis and embolism: Secondary | ICD-10-CM | POA: Diagnosis not present

## 2013-12-21 DIAGNOSIS — F17200 Nicotine dependence, unspecified, uncomplicated: Secondary | ICD-10-CM | POA: Diagnosis not present

## 2013-12-21 DIAGNOSIS — Z113 Encounter for screening for infections with a predominantly sexual mode of transmission: Secondary | ICD-10-CM | POA: Diagnosis not present

## 2013-12-21 DIAGNOSIS — R9431 Abnormal electrocardiogram [ECG] [EKG]: Secondary | ICD-10-CM | POA: Diagnosis not present

## 2013-12-21 DIAGNOSIS — I120 Hypertensive chronic kidney disease with stage 5 chronic kidney disease or end stage renal disease: Secondary | ICD-10-CM | POA: Diagnosis not present

## 2013-12-21 DIAGNOSIS — Z21 Asymptomatic human immunodeficiency virus [HIV] infection status: Secondary | ICD-10-CM | POA: Diagnosis not present

## 2013-12-21 DIAGNOSIS — I719 Aortic aneurysm of unspecified site, without rupture: Secondary | ICD-10-CM | POA: Diagnosis not present

## 2013-12-21 DIAGNOSIS — B20 Human immunodeficiency virus [HIV] disease: Secondary | ICD-10-CM

## 2013-12-21 DIAGNOSIS — N186 End stage renal disease: Secondary | ICD-10-CM | POA: Insufficient documentation

## 2013-12-21 DIAGNOSIS — Z79899 Other long term (current) drug therapy: Secondary | ICD-10-CM

## 2013-12-21 NOTE — Progress Notes (Signed)
Echocardiogram performed.  

## 2013-12-22 ENCOUNTER — Telehealth: Payer: Self-pay | Admitting: Cardiology

## 2013-12-22 ENCOUNTER — Telehealth: Payer: Self-pay | Admitting: *Deleted

## 2013-12-22 LAB — LIPID PANEL
Cholesterol: 117 mg/dL (ref 0–200)
HDL: 50 mg/dL (ref >39)
LDL CALC: 40 mg/dL (ref 0–99)
TRIGLYCERIDES: 135 mg/dL (ref <150)
Total CHOL/HDL Ratio: 2.3 Ratio
VLDL: 27 mg/dL (ref 0–40)

## 2013-12-22 LAB — COMPLETE METABOLIC PANEL WITH GFR
ALT: 21 U/L (ref 0–53)
AST: 24 U/L (ref 0–37)
Albumin: 4.3 g/dL (ref 3.5–5.2)
Alkaline Phosphatase: 68 U/L (ref 39–117)
BUN: 77 mg/dL — ABNORMAL HIGH (ref 6–23)
CHLORIDE: 94 meq/L — AB (ref 96–112)
CO2: 28 meq/L (ref 19–32)
Calcium: 8.6 mg/dL (ref 8.4–10.5)
Creat: 6.99 mg/dL — ABNORMAL HIGH (ref 0.50–1.35)
GFR, EST AFRICAN AMERICAN: 10 mL/min — AB
GFR, EST NON AFRICAN AMERICAN: 9 mL/min — AB
Glucose, Bld: 83 mg/dL (ref 70–99)
Potassium: 5.7 mEq/L — ABNORMAL HIGH (ref 3.5–5.3)
SODIUM: 137 meq/L (ref 135–145)
Total Bilirubin: 0.4 mg/dL (ref 0.3–1.2)
Total Protein: 7.7 g/dL (ref 6.0–8.3)

## 2013-12-22 LAB — CBC WITH DIFFERENTIAL/PLATELET
BASOS ABS: 0 10*3/uL (ref 0.0–0.1)
BASOS PCT: 1 % (ref 0–1)
EOS ABS: 0.2 10*3/uL (ref 0.0–0.7)
Eosinophils Relative: 2 % (ref 0–5)
HCT: 32.8 % — ABNORMAL LOW (ref 39.0–52.0)
Hemoglobin: 11.1 g/dL — ABNORMAL LOW (ref 13.0–17.0)
Lymphocytes Relative: 28 % (ref 12–46)
Lymphs Abs: 2 10*3/uL (ref 0.7–4.0)
MCH: 36.2 pg — ABNORMAL HIGH (ref 26.0–34.0)
MCHC: 33.8 g/dL (ref 30.0–36.0)
MCV: 106.8 fL — ABNORMAL HIGH (ref 78.0–100.0)
MONO ABS: 0.5 10*3/uL (ref 0.1–1.0)
Monocytes Relative: 8 % (ref 3–12)
NEUTROS ABS: 4.4 10*3/uL (ref 1.7–7.7)
Neutrophils Relative %: 61 % (ref 43–77)
Platelets: 147 10*3/uL — ABNORMAL LOW (ref 150–400)
RBC: 3.07 MIL/uL — ABNORMAL LOW (ref 4.22–5.81)
RDW: 17 % — AB (ref 11.5–15.5)
WBC: 7.1 10*3/uL (ref 4.0–10.5)

## 2013-12-22 LAB — RPR

## 2013-12-22 LAB — HIV-1 RNA QUANT-NO REFLEX-BLD
HIV 1 RNA Quant: <20 copies/mL (ref <20)
HIV-1 RNA Quant, Log: <1.3 {Log} (ref <1.30)

## 2013-12-22 NOTE — Telephone Encounter (Signed)
New message ° ° ° ° ° ° ° ° ° °Pt returning nurses call °

## 2013-12-22 NOTE — Telephone Encounter (Signed)
A faxed was sent to Dr Elie Confer on pt from [facility] - Prothrombin time 19.0 H ( 11.7 - 14.2 ) and INR 1.67 H ( 0.89 - 1.14 ) dated 12/20/13. Hilda Blades Kayse Puccini RN 12/22/13 3:30PM

## 2013-12-22 NOTE — Telephone Encounter (Signed)
**Note De-Identified  Obfuscation** The pt states that Lattie Haw called him today but he could not understand the message. Message forwarded to Lattie Haw as I dont know what call was concerning.

## 2013-12-23 ENCOUNTER — Ambulatory Visit (HOSPITAL_COMMUNITY): Payer: Medicare Other | Attending: Cardiology | Admitting: Radiology

## 2013-12-23 ENCOUNTER — Encounter: Payer: Self-pay | Admitting: Cardiology

## 2013-12-23 VITALS — BP 128/74 | HR 75 | Ht 75.0 in | Wt 178.0 lb

## 2013-12-23 DIAGNOSIS — R0609 Other forms of dyspnea: Secondary | ICD-10-CM | POA: Insufficient documentation

## 2013-12-23 DIAGNOSIS — Z01810 Encounter for preprocedural cardiovascular examination: Secondary | ICD-10-CM | POA: Diagnosis not present

## 2013-12-23 DIAGNOSIS — R9431 Abnormal electrocardiogram [ECG] [EKG]: Secondary | ICD-10-CM | POA: Insufficient documentation

## 2013-12-23 DIAGNOSIS — F17200 Nicotine dependence, unspecified, uncomplicated: Secondary | ICD-10-CM | POA: Diagnosis not present

## 2013-12-23 DIAGNOSIS — R0602 Shortness of breath: Secondary | ICD-10-CM | POA: Diagnosis not present

## 2013-12-23 DIAGNOSIS — I739 Peripheral vascular disease, unspecified: Secondary | ICD-10-CM | POA: Diagnosis not present

## 2013-12-23 DIAGNOSIS — I10 Essential (primary) hypertension: Secondary | ICD-10-CM | POA: Diagnosis not present

## 2013-12-23 DIAGNOSIS — R0989 Other specified symptoms and signs involving the circulatory and respiratory systems: Secondary | ICD-10-CM | POA: Insufficient documentation

## 2013-12-23 DIAGNOSIS — R42 Dizziness and giddiness: Secondary | ICD-10-CM | POA: Diagnosis not present

## 2013-12-23 DIAGNOSIS — R6889 Other general symptoms and signs: Secondary | ICD-10-CM | POA: Diagnosis not present

## 2013-12-23 DIAGNOSIS — R209 Unspecified disturbances of skin sensation: Secondary | ICD-10-CM | POA: Insufficient documentation

## 2013-12-23 LAB — T-HELPER CELL (CD4) - (RCID CLINIC ONLY)
CD4 T CELL ABS: 1000 /uL (ref 400–2700)
CD4 T CELL HELPER: 46 % (ref 33–55)

## 2013-12-23 MED ORDER — REGADENOSON 0.4 MG/5ML IV SOLN
0.4000 mg | Freq: Once | INTRAVENOUS | Status: AC
  Administered 2013-12-23: 0.4 mg via INTRAVENOUS

## 2013-12-23 MED ORDER — TECHNETIUM TC 99M SESTAMIBI GENERIC - CARDIOLITE
11.0000 | Freq: Once | INTRAVENOUS | Status: AC | PRN
  Administered 2013-12-23: 11 via INTRAVENOUS

## 2013-12-23 MED ORDER — TECHNETIUM TC 99M SESTAMIBI GENERIC - CARDIOLITE
33.0000 | Freq: Once | INTRAVENOUS | Status: AC | PRN
  Administered 2013-12-23: 33 via INTRAVENOUS

## 2013-12-23 NOTE — Progress Notes (Signed)
Quick Note:  Dr. Meda Coffee states she will be contacting patient directly with results. ______

## 2013-12-23 NOTE — Progress Notes (Signed)
[facility] NUCLEAR [facility] [address] [place], [place] [address] [phone]    Cardiology Nuclear Med Study  Brian [name] is a 38 y.o. male     MRN : [id number]     DOB: [birth year]  Procedure Date: 12/23/2013  Nuclear Med Background Indication for Stress Test:  Evaluation for Ischemia and Surgical Clearance Hernia Repair- Dr, Brian [name] History:  No known CAD, Echo 2015 EF 35-40%, COPD Cardiac Risk Factors: Family History - CAD, Hypertension, PVD and Smoker  Symptoms:  DOE   Nuclear Pre-Procedure Caffeine/Decaff Intake:  1 am NPO After: 1:00am   Lungs:  clear O2 Sat: 100% on room air. IV 0.9% NS with Angio Cath:  22g  IV Site: L Antecubital  IV Started by:  Brian Figures, RN  Chest Size (in):  44 Cup Size: n/a  Height: 6\' 3"  (1.905 m)  Weight:  178 lb (80.74 kg)  BMI:  Body mass index is 22.25 kg/(m^2). Tech Comments:  N/A    Nuclear Med Study 1 or 2 day study: 1 day  Stress Test Type:  Lexiscan  Reading MD: N/A  Order Authorizing Provider:  Ena Dawley, MD  Resting Radionuclide: Technetium 26m Sestamibi  Resting Radionuclide Dose: 11.0 mCi   Stress Radionuclide:  Technetium 10m Sestamibi  Stress Radionuclide Dose: 33.0 mCi           Stress Protocol Rest HR: 75 Stress HR: 98  Rest BP: 128/74 Stress BP: 118/64  Exercise Time (min): n/a METS: n/a           Dose of Adenosine (mg):  n/a Dose of Lexiscan: 0.4 mg  Dose of Atropine (mg): n/a Dose of Dobutamine: n/a mcg/kg/min (at max HR)  Stress Test Technologist: Brian [name], BS-ES  Nuclear Technologist:  Brian [name], CNMT     Rest Procedure:  Myocardial perfusion imaging was performed at rest 45 minutes following the intravenous administration of Technetium 39m Sestamibi. Rest ECG: NSR with non-specific ST-T wave changes  LVH  Stress Procedure:  The patient received IV Lexiscan 0.4 mg over 15-seconds.  Technetium 49m Sestamibi injected at 30-seconds.  Quantitative spect images were  obtained after a 45 minute delay.  During the infusion of Lexiscan, the patient complained of SOB, lightheadedness and tingly fingers.  These symptoms resolved in recovery.  Stress ECG: No significant change from baseline ECG  QPS Raw Data Images:  Soft tissue (diaphragm, bowel activity) underlie the inferior wall.   Stress Images:  Moderate defect in the inferior (base, mid, minimally distal), inferoseptal (base, mid)  And inferolateral (base) walls.  Otherwise normal perfusion.   Rest Images:  Comparison with the stress images reveals no significant change. Subtraction (SDS):  No evidence of ischemia. Transient Ischemic Dilatation (Normal <1.22):  1.04 Lung/Heart Ratio (Normal <0.45):  0.30  Quantitative Gated Spect Images QGS EDV:  295 ml QGS ESV:  188 ml  Impression Exercise Capacity:  Lexiscan with no exercise. BP Response:  Normal blood pressure response. Clinical Symptoms:  No chest pain. ECG Impression:  No significant ST segment change suggestive of ischemia. Comparison with Prior Nuclear Study: no old studies for comparison.  Overall Impression:  Myoview scan with moderate fixed defect in the inferior/inferolseptal walls consistent with scar and possible soft tissue attenuation (diaphragm, bowel activity)  No evidence of ischemia.    LV Ejection Fraction: 36%.  LV Wall Motion:  Inferior and septal hypokinesis.    Brian [name]

## 2013-12-28 NOTE — Telephone Encounter (Signed)
Notes Recorded by Talbert Cage, RN on 12/23/2013 at 10:56 AM Dr. Meda Coffee states she will be contacting patient directly with results. ------

## 2013-12-29 DIAGNOSIS — I82509 Chronic embolism and thrombosis of unspecified deep veins of unspecified lower extremity: Secondary | ICD-10-CM | POA: Diagnosis not present

## 2013-12-30 ENCOUNTER — Other Ambulatory Visit: Payer: Self-pay | Admitting: Internal Medicine

## 2013-12-31 ENCOUNTER — Telehealth: Payer: Self-pay | Admitting: *Deleted

## 2013-12-31 NOTE — Telephone Encounter (Signed)
Received a fax from [facility] reporting INR of 2.11 and PT of 22.7 Collected 12/29/13  Dr Elie Confer paged, lab report given to Dr Algis Liming

## 2014-01-03 DIAGNOSIS — I749 Embolism and thrombosis of unspecified artery: Secondary | ICD-10-CM | POA: Diagnosis not present

## 2014-01-04 ENCOUNTER — Telehealth: Payer: Self-pay | Admitting: Pharmacist

## 2014-01-04 ENCOUNTER — Ambulatory Visit: Payer: Medicare Other | Admitting: Internal Medicine

## 2014-01-04 NOTE — Telephone Encounter (Signed)
Another INR and Prothrombin time from 12/29/13  From [facility] and Dr Algis Liming address 12/31/13.

## 2014-01-04 NOTE — Telephone Encounter (Signed)
Give TWO reports of INRs collected on 12/29/13 = 2.11 AND a value collected on 01/03/14. My recommendations for dose INCREASE *(1 & 1/2 tablets x 5mg  = 7.5mg  warfarin PO QD ) are based upon the MOST RECENT INR.

## 2014-01-04 NOTE — Telephone Encounter (Signed)
Was delivered a fax from [facility] at [facility]. Delivered to me my Maurine Simmering, Triage Nurse for today in [facility]. INR = 1.68 on warfarin 1&1/2 tablets (5mg  strength tablet) M-F; 1 tablet only on Sat/Sun. Will INCREASE to 1 & 1/2 x 5mg  tablets (7.5mg  warfarin) PO QD. Next INR in 2 weeks.

## 2014-01-10 ENCOUNTER — Telehealth: Payer: Self-pay | Admitting: *Deleted

## 2014-01-10 DIAGNOSIS — I749 Embolism and thrombosis of unspecified artery: Secondary | ICD-10-CM | POA: Diagnosis not present

## 2014-01-10 NOTE — Telephone Encounter (Signed)
Spoke with [facility] and when it was put in under Lamivudine 100 mg it went through without any restriction. Patient and pharmacy will be notified. Myrtis Hopping

## 2014-01-10 NOTE — Telephone Encounter (Signed)
Message copied by Georgena Spurling on Mon Jan 10, 2014  9:16 AM ------      Message from: Thayer Headings      Created: Fri Jan 07, 2014  4:43 PM       I got a message that his insurance, [facility], does not want to cover his Epivir 0.5 Tab 100 mg daily.  It is listed as Epivir HBV in the letter (same medicaiton but HBV is for hepatitis B).  Not sure if that is the issue.  It is for HIV only, renally dosed and he needs it.  They gave him a 30 days supply.  [phone].  thanks ------

## 2014-01-12 ENCOUNTER — Telehealth: Payer: Self-pay | Admitting: *Deleted

## 2014-01-12 NOTE — Telephone Encounter (Signed)
Results of INR 1.80 ( 0.89-1.14 ) and Prothrombin time 20.1 ( 11.7 - 14.2 )  faxed from [facility] - Dr Elie Confer aware of results by phone. Hilda Blades Ignacio Lowder RN 01/12/14 4:45PM

## 2014-01-13 ENCOUNTER — Ambulatory Visit (INDEPENDENT_AMBULATORY_CARE_PROVIDER_SITE_OTHER): Payer: Medicare Other | Admitting: Cardiology

## 2014-01-13 ENCOUNTER — Encounter: Payer: Self-pay | Admitting: Cardiology

## 2014-01-13 VITALS — BP 110/70 | HR 90 | Ht 75.0 in | Wt 176.1 lb

## 2014-01-13 DIAGNOSIS — I712 Thoracic aortic aneurysm, without rupture, unspecified: Secondary | ICD-10-CM | POA: Diagnosis not present

## 2014-01-13 DIAGNOSIS — I5022 Chronic systolic (congestive) heart failure: Secondary | ICD-10-CM | POA: Diagnosis not present

## 2014-01-13 MED ORDER — LISINOPRIL 2.5 MG PO TABS: 2.5000 mg | ORAL_TABLET | Freq: Every day | ORAL | Status: DC

## 2014-01-13 NOTE — Addendum Note (Signed)
Addended by: Lamar Laundry on: 01/13/2014 04:05 PM   Modules accepted: Orders

## 2014-01-13 NOTE — Patient Instructions (Signed)
Start Lisionpril 2.5mg  take at bedtime.  Non-Cardiac CT Angiography (CTA), is a special type of CT scan that uses a computer to produce multi-dimensional views of major blood vessels throughout the body. In CT angiography, a contrast material is injected through an IV to help visualize the blood vessels  Your physician recommends that you schedule a follow-up appointment in 2 months
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
## 2014-01-14 NOTE — Telephone Encounter (Signed)
Called by Neoma Laming Ditzler RN from [facility] on 28-JAN-15 at [phone]. Result has been interpreted (INR = 1.80 on 52.5mg  warfarin per week [ as 1&1/2 x 5mg  tablets = 7.5mg ]); Will increase to 57.5mg  per week as 1&1/2 x 5mg  (7.5mg ) each day except 2x5mg  (10mg ) on Tuesdays and Fridays. I have contacted the patient to apprise him of this dose change. He is advised to have the HD center re-collect INR in approximately 2 weeks.

## 2014-01-15 DIAGNOSIS — N186 End stage renal disease: Secondary | ICD-10-CM | POA: Diagnosis not present

## 2014-01-17 DIAGNOSIS — N039 Chronic nephritic syndrome with unspecified morphologic changes: Secondary | ICD-10-CM | POA: Diagnosis not present

## 2014-01-17 DIAGNOSIS — D631 Anemia in chronic kidney disease: Secondary | ICD-10-CM | POA: Diagnosis not present

## 2014-01-17 DIAGNOSIS — N186 End stage renal disease: Secondary | ICD-10-CM | POA: Diagnosis not present

## 2014-01-17 DIAGNOSIS — I749 Embolism and thrombosis of unspecified artery: Secondary | ICD-10-CM | POA: Diagnosis not present

## 2014-01-17 DIAGNOSIS — D509 Iron deficiency anemia, unspecified: Secondary | ICD-10-CM | POA: Diagnosis not present

## 2014-01-17 DIAGNOSIS — N2581 Secondary hyperparathyroidism of renal origin: Secondary | ICD-10-CM | POA: Diagnosis not present

## 2014-01-18 ENCOUNTER — Other Ambulatory Visit: Payer: Self-pay | Admitting: *Deleted

## 2014-01-18 ENCOUNTER — Telehealth: Payer: Self-pay | Admitting: Pharmacist

## 2014-01-18 ENCOUNTER — Telehealth: Payer: Self-pay | Admitting: *Deleted

## 2014-01-18 LAB — PROTIME-INR

## 2014-01-18 MED ORDER — OXYCODONE-ACETAMINOPHEN 10-325 MG PO TABS: 1.0000 | ORAL_TABLET | Freq: Three times a day (TID) | ORAL | Status: DC | PRN

## 2014-01-18 NOTE — Telephone Encounter (Signed)
Called by Triage Nurse Edd Fabian with INR today = 3.24 on 1&1/2 x 5mg  = 7.5mg  warfarin all days but on TWO DAYS of week taking 2x5mg  = 10mg . Patient was instructed to DECREASE dose down to 7.5mg  warfarin (as 1&1/2 x 5mg  = 7.5mg  warfarin) QD until next INR during week of 16-FEB-15.

## 2014-01-18 NOTE — Telephone Encounter (Signed)
Nurse from [facility] called to make sure dr groce got PT/ INR results , fax given to [place]. To show dr Elie Confer

## 2014-01-19 NOTE — Telephone Encounter (Signed)
Tried to call pt, left one message the other # was out of service

## 2014-01-20 ENCOUNTER — Other Ambulatory Visit: Payer: Medicare Other

## 2014-01-24 DIAGNOSIS — I749 Embolism and thrombosis of unspecified artery: Secondary | ICD-10-CM | POA: Diagnosis not present

## 2014-01-24 LAB — PROTIME-INR

## 2014-01-26 DIAGNOSIS — I82509 Chronic embolism and thrombosis of unspecified deep veins of unspecified lower extremity: Secondary | ICD-10-CM | POA: Diagnosis not present

## 2014-01-27 ENCOUNTER — Telehealth: Payer: Self-pay | Admitting: Pharmacist

## 2014-01-27 ENCOUNTER — Telehealth: Payer: Self-pay | Admitting: *Deleted

## 2014-01-27 NOTE — Telephone Encounter (Signed)
Fax from [facility] - PT/INR 18.2/1.58. Result shown to Dr Butcher(Attending)-informed to paged Dr Elie Confer. Results given to Dr Elie Confer.

## 2014-01-27 NOTE — Telephone Encounter (Signed)
Instructed patient to take 2x5mg  (10mg ) warfarin PO on 2 days of week (Th/M) and 1&1/2x5mg  (7.5mg ) warfarin PO all other days of week. HD center to recollect INR in 2 weeks and fax or page me results.

## 2014-01-27 NOTE — Telephone Encounter (Signed)
Triage Nurse Holley Raring from [facility] calls with results that were collected on 11-FEB-15 and arrived to day by fax for the patient revealing INR by POC was 1.58 on 1&1/2x5mg  (7.5mg ) warfarin PO QD. I have called the patient and left a message that he will INCREASE warfarin dose as follows:  Take 2x5mg  (10mg ) warfarin PO on Thursdays and Mondays--commencing today. All other days, take 1&1/2x5mg  (7.5mg ) warfarin. Have HD center recollect INR in 2 weeks and fax results or may page directly to me at [phone].

## 2014-01-31 ENCOUNTER — Other Ambulatory Visit: Payer: Self-pay | Admitting: Infectious Disease

## 2014-01-31 DIAGNOSIS — I749 Embolism and thrombosis of unspecified artery: Secondary | ICD-10-CM | POA: Diagnosis not present

## 2014-01-31 DIAGNOSIS — B20 Human immunodeficiency virus [HIV] disease: Secondary | ICD-10-CM

## 2014-01-31 LAB — PROTIME-INR

## 2014-02-01 ENCOUNTER — Other Ambulatory Visit: Payer: Self-pay

## 2014-02-01 MED ORDER — EFAVIRENZ 600 MG PO TABS: ORAL_TABLET | ORAL | Status: DC

## 2014-02-07 DIAGNOSIS — I749 Embolism and thrombosis of unspecified artery: Secondary | ICD-10-CM | POA: Diagnosis not present

## 2014-02-09 ENCOUNTER — Telehealth: Payer: Self-pay | Admitting: *Deleted

## 2014-02-09 ENCOUNTER — Other Ambulatory Visit: Payer: Self-pay | Admitting: Internal Medicine

## 2014-02-09 NOTE — Telephone Encounter (Signed)
Can you page Dr. Elie Confer with these results for adjustment recommendations? Thanks.

## 2014-02-09 NOTE — Telephone Encounter (Signed)
PT= 19.3 INR= 1.71 COLLECTION DATE  02/07/2014

## 2014-02-09 NOTE — Telephone Encounter (Signed)
Spoke w/ dr Elie Confer, he will call pt/ kidney ctr this evening

## 2014-02-09 NOTE — Telephone Encounter (Signed)
MD please advise about refilling trazadone rx.  Quantity and # of refills.

## 2014-02-10 NOTE — Telephone Encounter (Signed)
Called by Triage Nurse Freddy Finner RN providing me the patient's INR collected at HD center. I have called the patient's number listed in EPIC and left VM as well as having faxed to the HD center the new instructions (2x5mg  = 10mg  every day of week--EXCEPT on Mondays/Thursdays--then take 1&1/2 x 5mg  = 7.5mg  warfarin); re-collect INR in 2 weeks.

## 2014-02-12 DIAGNOSIS — N186 End stage renal disease: Secondary | ICD-10-CM | POA: Diagnosis not present

## 2014-02-14 DIAGNOSIS — N186 End stage renal disease: Secondary | ICD-10-CM | POA: Diagnosis not present

## 2014-02-14 DIAGNOSIS — I749 Embolism and thrombosis of unspecified artery: Secondary | ICD-10-CM | POA: Diagnosis not present

## 2014-02-14 DIAGNOSIS — D509 Iron deficiency anemia, unspecified: Secondary | ICD-10-CM | POA: Diagnosis not present

## 2014-02-14 DIAGNOSIS — D631 Anemia in chronic kidney disease: Secondary | ICD-10-CM | POA: Diagnosis not present

## 2014-02-14 DIAGNOSIS — N2581 Secondary hyperparathyroidism of renal origin: Secondary | ICD-10-CM | POA: Diagnosis not present

## 2014-02-16 LAB — PROTIME-INR

## 2014-02-17 ENCOUNTER — Telehealth: Payer: Self-pay | Admitting: *Deleted

## 2014-02-17 ENCOUNTER — Telehealth: Payer: Self-pay | Admitting: Pharmacist

## 2014-02-17 NOTE — Telephone Encounter (Signed)
Will contact patient by phone and indicate he is to remain on current instructions which are:  Su-10mg M-7.5mg T-10mg W-10mg Th-7.5mg F-10mg Sa-10mg .

## 2014-02-17 NOTE — Telephone Encounter (Signed)
Continue 65mg /wk warfarin regimen using 5mg  strength tablets (10mg  = 2 tablets; 7.5mg  = 1.5 tablets) as SHOWN:  Su-10mg M-7.5mg T-10mg W-10mg Th-7.5mg F-10mg Sa-10mg  This regimen was phoned to the patient's cell phone documented in EPIC as well as faxed to his HD center with instructions to place these instructions in the hand(s) of the patient at his next visit. Will repeat INR in 2 weeks.

## 2014-02-17 NOTE — Telephone Encounter (Signed)
PT= 24.2 INR= 2.29 Dr Elie Confer was paged and given results, he states he will decide new dose of coumadin and fax the information to dialysis ctr, he states pt has not communicated with him on attempts by dr groce. So he will relay all info to the ctr

## 2014-02-18 ENCOUNTER — Other Ambulatory Visit: Payer: Self-pay | Admitting: *Deleted

## 2014-02-18 MED ORDER — OXYCODONE-ACETAMINOPHEN 10-325 MG PO TABS: 1.0000 | ORAL_TABLET | Freq: Three times a day (TID) | ORAL | Status: DC | PRN

## 2014-02-18 NOTE — Telephone Encounter (Signed)
Rx given to pt. 

## 2014-02-21 ENCOUNTER — Telehealth: Payer: Self-pay | Admitting: *Deleted

## 2014-02-21 NOTE — Telephone Encounter (Signed)
Received faxed request from pt's [facility] [phone]) for his proventil hfa.  Proventil non-preferred, but insurance will pay for ProAir HFA.  Contacted pt's pharmacy and made change over the phone, pt was contacted about the change and instructed to contact office to schedule next avail appt with pcp.  Pt did voice concern that he would have to change his dialysis to another day to accommodate an appt with PCP.  He was offered an appt with another MD, but stated that it wouldn't be a problem to switch days as he prefers to see [place]..Will forward info to pcp to have proventil changed to proair, phone call complete.Despina Hidden Cassady3/9/201510:43 AM

## 2014-02-22 LAB — PROTIME-INR

## 2014-02-23 DIAGNOSIS — I82509 Chronic embolism and thrombosis of unspecified deep veins of unspecified lower extremity: Secondary | ICD-10-CM | POA: Diagnosis not present

## 2014-02-28 ENCOUNTER — Telehealth: Payer: Self-pay | Admitting: Cardiology

## 2014-02-28 NOTE — Telephone Encounter (Signed)
New message          Pt says his kidney doctor told him do not take lisinopril and do not take a CT scan with dye. He can have a CT scan without dye.

## 2014-02-28 NOTE — Telephone Encounter (Signed)
LMTCB

## 2014-03-01 NOTE — Telephone Encounter (Signed)
LMTCB

## 2014-03-01 NOTE — Telephone Encounter (Signed)
Patient had INR reported to me my Bonnita Nasuti the triage nurse at 4:45PM on Monday 16-MAR-15. On 65mg  warfarin/week--INR = 2.05. Have contacted the patient today by phone and spoke with him directly indicating to increase dose to 10mg  warfarin PO QD EXCEPT on Wednesdays--then, take only 1&1/2 x 5mg  (7.5mg ). Repeat sequence every week for next 2-3 weeks then have PT/INR re-collected while at HD Center.

## 2014-03-04 ENCOUNTER — Encounter: Payer: Medicare Other | Admitting: Internal Medicine

## 2014-03-09 ENCOUNTER — Ambulatory Visit: Payer: Medicare Other | Admitting: Cardiology

## 2014-03-10 ENCOUNTER — Ambulatory Visit: Payer: Medicare Other | Admitting: Cardiology

## 2014-03-11 ENCOUNTER — Encounter: Payer: Medicare Other | Admitting: Internal Medicine

## 2014-03-15 DIAGNOSIS — N186 End stage renal disease: Secondary | ICD-10-CM | POA: Diagnosis not present

## 2014-03-16 DIAGNOSIS — N186 End stage renal disease: Secondary | ICD-10-CM | POA: Diagnosis not present

## 2014-03-16 DIAGNOSIS — D509 Iron deficiency anemia, unspecified: Secondary | ICD-10-CM | POA: Diagnosis not present

## 2014-03-16 DIAGNOSIS — D631 Anemia in chronic kidney disease: Secondary | ICD-10-CM | POA: Diagnosis not present

## 2014-03-16 DIAGNOSIS — N2581 Secondary hyperparathyroidism of renal origin: Secondary | ICD-10-CM | POA: Diagnosis not present

## 2014-03-18 DIAGNOSIS — D631 Anemia in chronic kidney disease: Secondary | ICD-10-CM | POA: Diagnosis not present

## 2014-03-18 DIAGNOSIS — N186 End stage renal disease: Secondary | ICD-10-CM | POA: Diagnosis not present

## 2014-03-18 DIAGNOSIS — N2581 Secondary hyperparathyroidism of renal origin: Secondary | ICD-10-CM | POA: Diagnosis not present

## 2014-03-18 DIAGNOSIS — N039 Chronic nephritic syndrome with unspecified morphologic changes: Secondary | ICD-10-CM | POA: Diagnosis not present

## 2014-03-18 DIAGNOSIS — D509 Iron deficiency anemia, unspecified: Secondary | ICD-10-CM | POA: Diagnosis not present

## 2014-03-21 ENCOUNTER — Other Ambulatory Visit: Payer: Self-pay | Admitting: *Deleted

## 2014-03-21 DIAGNOSIS — N186 End stage renal disease: Secondary | ICD-10-CM | POA: Diagnosis not present

## 2014-03-21 DIAGNOSIS — D631 Anemia in chronic kidney disease: Secondary | ICD-10-CM | POA: Diagnosis not present

## 2014-03-21 DIAGNOSIS — D509 Iron deficiency anemia, unspecified: Secondary | ICD-10-CM | POA: Diagnosis not present

## 2014-03-21 DIAGNOSIS — N2581 Secondary hyperparathyroidism of renal origin: Secondary | ICD-10-CM | POA: Diagnosis not present

## 2014-03-21 MED ORDER — OXYCODONE-ACETAMINOPHEN 10-325 MG PO TABS: 1.0000 | ORAL_TABLET | Freq: Three times a day (TID) | ORAL | Status: DC | PRN

## 2014-03-21 NOTE — Telephone Encounter (Signed)
Last refilled 3/6 Call when ready @ [phone]

## 2014-03-23 ENCOUNTER — Other Ambulatory Visit: Payer: Self-pay | Admitting: Internal Medicine

## 2014-03-23 DIAGNOSIS — D631 Anemia in chronic kidney disease: Secondary | ICD-10-CM | POA: Diagnosis not present

## 2014-03-23 DIAGNOSIS — D509 Iron deficiency anemia, unspecified: Secondary | ICD-10-CM | POA: Diagnosis not present

## 2014-03-23 DIAGNOSIS — I82509 Chronic embolism and thrombosis of unspecified deep veins of unspecified lower extremity: Secondary | ICD-10-CM | POA: Diagnosis not present

## 2014-03-23 DIAGNOSIS — N2581 Secondary hyperparathyroidism of renal origin: Secondary | ICD-10-CM | POA: Diagnosis not present

## 2014-03-23 DIAGNOSIS — N186 End stage renal disease: Secondary | ICD-10-CM | POA: Diagnosis not present

## 2014-03-23 MED ORDER — WARFARIN SODIUM 5 MG PO TABS: ORAL_TABLET | ORAL | Status: DC

## 2014-03-24 ENCOUNTER — Telehealth: Payer: Self-pay | Admitting: *Deleted

## 2014-03-24 NOTE — Telephone Encounter (Signed)
Received PT/INR result from [facility] - collected 03/23/14; PT 21.2 and INR 1.93. Result sent to [name]. Call from Dr Elie Confer - he will give pt a call. Brian [name]

## 2014-03-24 NOTE — Telephone Encounter (Signed)
Thanks drG

## 2014-03-25 DIAGNOSIS — D509 Iron deficiency anemia, unspecified: Secondary | ICD-10-CM | POA: Diagnosis not present

## 2014-03-25 DIAGNOSIS — N186 End stage renal disease: Secondary | ICD-10-CM | POA: Diagnosis not present

## 2014-03-25 DIAGNOSIS — D631 Anemia in chronic kidney disease: Secondary | ICD-10-CM | POA: Diagnosis not present

## 2014-03-25 DIAGNOSIS — N2581 Secondary hyperparathyroidism of renal origin: Secondary | ICD-10-CM | POA: Diagnosis not present

## 2014-03-28 DIAGNOSIS — N2581 Secondary hyperparathyroidism of renal origin: Secondary | ICD-10-CM | POA: Diagnosis not present

## 2014-03-28 DIAGNOSIS — D509 Iron deficiency anemia, unspecified: Secondary | ICD-10-CM | POA: Diagnosis not present

## 2014-03-28 DIAGNOSIS — N186 End stage renal disease: Secondary | ICD-10-CM | POA: Diagnosis not present

## 2014-03-28 DIAGNOSIS — N039 Chronic nephritic syndrome with unspecified morphologic changes: Secondary | ICD-10-CM | POA: Diagnosis not present

## 2014-03-28 DIAGNOSIS — D631 Anemia in chronic kidney disease: Secondary | ICD-10-CM | POA: Diagnosis not present

## 2014-03-30 DIAGNOSIS — D509 Iron deficiency anemia, unspecified: Secondary | ICD-10-CM | POA: Diagnosis not present

## 2014-03-30 DIAGNOSIS — N2581 Secondary hyperparathyroidism of renal origin: Secondary | ICD-10-CM | POA: Diagnosis not present

## 2014-03-30 DIAGNOSIS — N186 End stage renal disease: Secondary | ICD-10-CM | POA: Diagnosis not present

## 2014-03-30 DIAGNOSIS — D631 Anemia in chronic kidney disease: Secondary | ICD-10-CM | POA: Diagnosis not present

## 2014-04-01 ENCOUNTER — Ambulatory Visit (INDEPENDENT_AMBULATORY_CARE_PROVIDER_SITE_OTHER): Payer: Medicare Other | Admitting: Internal Medicine

## 2014-04-01 ENCOUNTER — Encounter: Payer: Self-pay | Admitting: Internal Medicine

## 2014-04-01 VITALS — BP 135/82 | HR 110 | Temp 97.4°F | Ht 75.0 in | Wt 177.1 lb

## 2014-04-01 DIAGNOSIS — Q613 Polycystic kidney, unspecified: Secondary | ICD-10-CM

## 2014-04-01 DIAGNOSIS — N2581 Secondary hyperparathyroidism of renal origin: Secondary | ICD-10-CM | POA: Diagnosis not present

## 2014-04-01 DIAGNOSIS — D631 Anemia in chronic kidney disease: Secondary | ICD-10-CM | POA: Diagnosis not present

## 2014-04-01 DIAGNOSIS — N529 Male erectile dysfunction, unspecified: Secondary | ICD-10-CM | POA: Diagnosis not present

## 2014-04-01 DIAGNOSIS — N186 End stage renal disease: Secondary | ICD-10-CM | POA: Diagnosis not present

## 2014-04-01 DIAGNOSIS — N039 Chronic nephritic syndrome with unspecified morphologic changes: Secondary | ICD-10-CM | POA: Diagnosis not present

## 2014-04-01 DIAGNOSIS — D509 Iron deficiency anemia, unspecified: Secondary | ICD-10-CM | POA: Diagnosis not present

## 2014-04-01 DIAGNOSIS — I10 Essential (primary) hypertension: Secondary | ICD-10-CM

## 2014-04-01 MED ORDER — TADALAFIL 5 MG PO TABS: 5.0000 mg | ORAL_TABLET | Freq: Every day | ORAL | Status: DC | PRN
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
## 2014-04-01 NOTE — Patient Instructions (Signed)
Please bring your medicines with you each time you come.   Medicines may be  Eye drops  Herbal   Vitamins  Pills  Seeing these help [place] take care of you.  Erectile Dysfunction Erectile dysfunction is the inability to get or sustain a good enough erection to have sexual intercourse. Erectile dysfunction may involve:  Inability to get an erection.  Lack of enough hardness to allow penetration.  Loss of the erection before sex is finished.  Premature ejaculation. CAUSES  Certain drugs, such as:  Pain relievers.  Antihistamines.  Antidepressants.  Blood pressure medicines.  Water pills (diuretics).  Ulcer medicines.  Muscle relaxants.  Illegal drugs.  Excessive drinking.  Psychological causes, such as:  Anxiety.  Depression.  Sadness.  Exhaustion.  Performance fear.  Stress.  Physical causes, such as:  Artery problems. This may include diabetes, smoking, liver disease, or atherosclerosis.  High blood pressure.  Hormonal problems, such as low testosterone.  Obesity.  Nerve problems. This may include back or pelvic injuries, diabetes mellitus, multiple sclerosis, or Parkinson disease. SYMPTOMS  Inability to get an erection.  Lack of enough hardness to allow penetration.  Loss of the erection before sex is finished.  Premature ejaculation.  Normal erections at some times, but with frequent unsatisfactory episodes.  Orgasms that are not satisfactory in sensation or frequency.  Low sexual satisfaction in either partner because of erection problems.  A curved penis occurring with erection. The curve may cause pain or may be too curved to allow for intercourse.  Never having nighttime erections. DIAGNOSIS Your caregiver can often diagnose this condition by:  Performing a physical exam to find other diseases or specific problems with the penis.  Asking you detailed questions about the problem.  Performing blood tests to check for  diabetes mellitus or to measure hormone levels.  Performing urine tests to find other underlying health conditions.  Performing an ultrasound exam to check for scarring.  Performing a test to check blood flow to the penis.  Doing a sleep study at home to measure nighttime erections. TREATMENT   You may be prescribed medicines by mouth.  You may be given medicine injections into the penis.  You may be prescribed a vacuum pump with a ring.  Penile implant surgery may be performed. You may receive:  An inflatable implant.  A semirigid implant.  Blood vessel surgery may be performed. HOME CARE INSTRUCTIONS  If you are prescribed oral medicine, you should take the medicine as prescribed. Do not increase the dosage without first discussing it with your physician.  If you are using self-injections, be careful to avoid any veins that are on the surface of the penis. Apply pressure to the injection site for 5 minutes.  If you are using a vacuum pump, make sure you have read the instructions before using it. Discuss any questions with your physician before taking the pump home. SEEK MEDICAL CARE IF:  You experience pain that is not responsive to the pain medicine you have been prescribed.  You experience nausea or vomiting. SEEK IMMEDIATE MEDICAL CARE IF:   When taking oral or injectable medications, you experience an erection that lasts longer than 4 hours. If your physician is unavailable, go to the nearest emergency room for evaluation. An erection that lasts much longer than 4 hours can result in permanent damage to your penis.  You have pain that is severe.  You develop redness, severe pain, or severe swelling of your penis.  You have redness  spreading up into your groin or lower abdomen.  You are unable to pass your urine. Document Released: 11/29/2000 Document Revised: 08/04/2013 Document Reviewed: 05/06/2013 [facility] Patient Information 2014 [place].

## 2014-04-02 DIAGNOSIS — N529 Male erectile dysfunction, unspecified: Secondary | ICD-10-CM | POA: Insufficient documentation

## 2014-04-02 NOTE — Assessment & Plan Note (Addendum)
BP Readings from Last 3 Encounters:  04/01/14 135/82  01/13/14 110/70  12/23/13 128/74   Pt is still maintaining good control on just lisinopril and HD. Continue current management.

## 2014-04-02 NOTE — Assessment & Plan Note (Addendum)
This appears to be more of an organic/physiologic etiology as pt has lost/had sudden change of nocturnal penile tumescence. Patient is interested in trying some medications and other offending medications including BB are not taken by the patient. 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  -referral to Urology for both re-evaluation of previously desired bilateral nephrectomy and evaluation of ED -trial of cilasis depending on insurance coverage was provided -extensive discussion into side effects and safe sex practices given HIV status was had with the patient whom verbalized understanding and had all questions answered

## 2014-04-04 DIAGNOSIS — D631 Anemia in chronic kidney disease: Secondary | ICD-10-CM | POA: Diagnosis not present

## 2014-04-04 DIAGNOSIS — N186 End stage renal disease: Secondary | ICD-10-CM | POA: Diagnosis not present

## 2014-04-04 DIAGNOSIS — D509 Iron deficiency anemia, unspecified: Secondary | ICD-10-CM | POA: Diagnosis not present

## 2014-04-04 DIAGNOSIS — N2581 Secondary hyperparathyroidism of renal origin: Secondary | ICD-10-CM | POA: Diagnosis not present

## 2014-04-04 LAB — PROTIME-INR

## 2014-04-04 NOTE — Progress Notes (Signed)
Presenting complaints, medical history, medications, reviewed with resident physician Dr [name] and I concur with management plan.  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  Urology referral appropriate. Murriel Hopper, MD, [place]  Hematology-Oncology/Internal Medicine

## 2014-04-05 ENCOUNTER — Ambulatory Visit (INDEPENDENT_AMBULATORY_CARE_PROVIDER_SITE_OTHER): Payer: Medicare Other | Admitting: Internal Medicine

## 2014-04-05 ENCOUNTER — Encounter: Payer: Self-pay | Admitting: Internal Medicine

## 2014-04-05 VITALS — BP 137/80 | HR 94 | Temp 98.3°F | Ht 74.0 in | Wt 178.5 lb

## 2014-04-05 DIAGNOSIS — B20 Human immunodeficiency virus [HIV] disease: Secondary | ICD-10-CM
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
## 2014-04-06 DIAGNOSIS — D509 Iron deficiency anemia, unspecified: Secondary | ICD-10-CM | POA: Diagnosis not present

## 2014-04-06 DIAGNOSIS — N2581 Secondary hyperparathyroidism of renal origin: Secondary | ICD-10-CM | POA: Diagnosis not present

## 2014-04-06 DIAGNOSIS — N186 End stage renal disease: Secondary | ICD-10-CM | POA: Diagnosis not present

## 2014-04-06 DIAGNOSIS — D631 Anemia in chronic kidney disease: Secondary | ICD-10-CM | POA: Diagnosis not present

## 2014-04-08 DIAGNOSIS — N2581 Secondary hyperparathyroidism of renal origin: Secondary | ICD-10-CM | POA: Diagnosis not present

## 2014-04-08 DIAGNOSIS — D509 Iron deficiency anemia, unspecified: Secondary | ICD-10-CM | POA: Diagnosis not present

## 2014-04-08 DIAGNOSIS — N186 End stage renal disease: Secondary | ICD-10-CM | POA: Diagnosis not present

## 2014-04-08 DIAGNOSIS — D631 Anemia in chronic kidney disease: Secondary | ICD-10-CM | POA: Diagnosis not present

## 2014-04-11 DIAGNOSIS — N186 End stage renal disease: Secondary | ICD-10-CM | POA: Diagnosis not present

## 2014-04-11 DIAGNOSIS — N2581 Secondary hyperparathyroidism of renal origin: Secondary | ICD-10-CM | POA: Diagnosis not present

## 2014-04-11 DIAGNOSIS — D631 Anemia in chronic kidney disease: Secondary | ICD-10-CM | POA: Diagnosis not present

## 2014-04-11 DIAGNOSIS — D509 Iron deficiency anemia, unspecified: Secondary | ICD-10-CM | POA: Diagnosis not present

## 2014-04-13 DIAGNOSIS — N186 End stage renal disease: Secondary | ICD-10-CM | POA: Diagnosis not present

## 2014-04-13 DIAGNOSIS — D509 Iron deficiency anemia, unspecified: Secondary | ICD-10-CM | POA: Diagnosis not present

## 2014-04-13 DIAGNOSIS — D631 Anemia in chronic kidney disease: Secondary | ICD-10-CM | POA: Diagnosis not present

## 2014-04-13 DIAGNOSIS — N2581 Secondary hyperparathyroidism of renal origin: Secondary | ICD-10-CM | POA: Diagnosis not present

## 2014-04-14 ENCOUNTER — Other Ambulatory Visit: Payer: Self-pay | Admitting: Internal Medicine

## 2014-04-14 DIAGNOSIS — N186 End stage renal disease: Secondary | ICD-10-CM | POA: Diagnosis not present

## 2014-04-15 DIAGNOSIS — N186 End stage renal disease: Secondary | ICD-10-CM | POA: Diagnosis not present

## 2014-04-15 DIAGNOSIS — N039 Chronic nephritic syndrome with unspecified morphologic changes: Secondary | ICD-10-CM | POA: Diagnosis not present

## 2014-04-15 DIAGNOSIS — N2581 Secondary hyperparathyroidism of renal origin: Secondary | ICD-10-CM | POA: Diagnosis not present

## 2014-04-15 DIAGNOSIS — D509 Iron deficiency anemia, unspecified: Secondary | ICD-10-CM | POA: Diagnosis not present

## 2014-04-15 DIAGNOSIS — Z23 Encounter for immunization: Secondary | ICD-10-CM | POA: Diagnosis not present

## 2014-04-15 DIAGNOSIS — D631 Anemia in chronic kidney disease: Secondary | ICD-10-CM | POA: Diagnosis not present

## 2014-04-19 ENCOUNTER — Other Ambulatory Visit: Payer: Self-pay | Admitting: *Deleted

## 2014-04-19 MED ORDER — OXYCODONE-ACETAMINOPHEN 10-325 MG PO TABS: 1.0000 | ORAL_TABLET | Freq: Three times a day (TID) | ORAL | Status: DC | PRN

## 2014-04-20 ENCOUNTER — Encounter: Payer: Self-pay | Admitting: Licensed Clinical Social Worker

## 2014-04-20 NOTE — Progress Notes (Signed)
CM-Brenya Taulbee assigned to CT until 08/2014

## 2014-04-21 ENCOUNTER — Other Ambulatory Visit: Payer: Self-pay | Admitting: Internal Medicine

## 2014-04-21 NOTE — Telephone Encounter (Signed)
Tried to call, cannot leave message

## 2014-04-27 DIAGNOSIS — I82509 Chronic embolism and thrombosis of unspecified deep veins of unspecified lower extremity: Secondary | ICD-10-CM | POA: Diagnosis not present

## 2014-04-28 ENCOUNTER — Encounter: Payer: Self-pay | Admitting: *Deleted

## 2014-04-28 NOTE — Progress Notes (Signed)
Received a fax from [facility] with INR results for pt. Collection date 04/27/14 Reported on 04/28/14 Result INR 1.54  Dr Elie Confer called and he will call pt with coumadin dose.

## 2014-05-10 DIAGNOSIS — N529 Male erectile dysfunction, unspecified: Secondary | ICD-10-CM | POA: Diagnosis not present

## 2014-05-10 DIAGNOSIS — Z3141 Encounter for fertility testing: Secondary | ICD-10-CM | POA: Diagnosis not present

## 2014-05-10 DIAGNOSIS — Q613 Polycystic kidney, unspecified: Secondary | ICD-10-CM | POA: Diagnosis not present

## 2014-05-10 DIAGNOSIS — R310 Gross hematuria: Secondary | ICD-10-CM | POA: Diagnosis not present

## 2014-05-15 DIAGNOSIS — N186 End stage renal disease: Secondary | ICD-10-CM | POA: Diagnosis not present

## 2014-05-16 DIAGNOSIS — N039 Chronic nephritic syndrome with unspecified morphologic changes: Secondary | ICD-10-CM | POA: Diagnosis not present

## 2014-05-16 DIAGNOSIS — D509 Iron deficiency anemia, unspecified: Secondary | ICD-10-CM | POA: Diagnosis not present

## 2014-05-16 DIAGNOSIS — N186 End stage renal disease: Secondary | ICD-10-CM | POA: Diagnosis not present

## 2014-05-16 DIAGNOSIS — D631 Anemia in chronic kidney disease: Secondary | ICD-10-CM | POA: Diagnosis not present

## 2014-05-16 DIAGNOSIS — N2581 Secondary hyperparathyroidism of renal origin: Secondary | ICD-10-CM | POA: Diagnosis not present

## 2014-05-18 ENCOUNTER — Other Ambulatory Visit: Payer: Self-pay | Admitting: Internal Medicine

## 2014-05-20 ENCOUNTER — Other Ambulatory Visit: Payer: Self-pay | Admitting: *Deleted

## 2014-05-20 MED ORDER — OXYCODONE-ACETAMINOPHEN 10-325 MG PO TABS: 1.0000 | ORAL_TABLET | Freq: Three times a day (TID) | ORAL | Status: DC | PRN

## 2014-05-20 NOTE — Telephone Encounter (Signed)
Rx ready - pt called/informed he can pick up rx @ designated times.

## 2014-05-20 NOTE — Telephone Encounter (Signed)
I will sent to Dr Elie Confer; pt states he has enough medication until Monday if he cannot fill until then.

## 2014-05-23 ENCOUNTER — Telehealth: Payer: Self-pay | Admitting: Pharmacist

## 2014-05-23 ENCOUNTER — Other Ambulatory Visit: Payer: Self-pay | Admitting: Internal Medicine

## 2014-05-23 MED ORDER — OXYCODONE-ACETAMINOPHEN 10-325 MG PO TABS: 1.0000 | ORAL_TABLET | Freq: Three times a day (TID) | ORAL | Status: DC | PRN

## 2014-05-23 MED ORDER — WARFARIN SODIUM 5 MG PO TABS: ORAL_TABLET | ORAL | Status: DC

## 2014-05-23 NOTE — Telephone Encounter (Signed)
Asked by Holley Raring, RN Triage Nurse for refill authorization for warfarin. Patient is having INRs collected while in hemodialysis and reacted to by telephonic management.
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
## 2014-05-25 DIAGNOSIS — I82509 Chronic embolism and thrombosis of unspecified deep veins of unspecified lower extremity: Secondary | ICD-10-CM | POA: Diagnosis not present

## 2014-05-26 ENCOUNTER — Telehealth: Payer: Self-pay | Admitting: *Deleted

## 2014-05-26 LAB — PROTIME-INR

## 2014-05-26 NOTE — Telephone Encounter (Signed)
Dr Elie Confer aware of INR done 05/25/14 at [facility] 2.72. Hilda Blades Maida Widger RN 05/26/14 4PM

## 2014-05-26 NOTE — Telephone Encounter (Signed)
Left message (and will fax instructions to the HD Center he attends) to [facility] current regimen of 67.5mg  warfarin/wk (2x5mg  tablets all days EXCEPT on  [facility] only 1&1/2x5mg ). Recollect on 29-JUN-15.

## 2014-06-06 DIAGNOSIS — I2699 Other pulmonary embolism without acute cor pulmonale: Secondary | ICD-10-CM | POA: Diagnosis not present

## 2014-06-08 ENCOUNTER — Telehealth: Payer: Self-pay | Admitting: *Deleted

## 2014-06-08 ENCOUNTER — Telehealth: Payer: Self-pay | Admitting: Pharmacist

## 2014-06-08 NOTE — Telephone Encounter (Signed)
Triage Nurse Edd Fabian) at [facility] sent PT/INR results to me via the pharmacy student. INR collected on 22-JUN-15 and reported to [facility] on 23-JUN-15 at 1645h. Results made available to me today. After visit summary (from [place]) was made available after updating with the current INR = 1.96 which reflects total weekly dose of 67.5mg  warfarin (as 2x5mg  = 10mg  on six days of week and 1&1/2 x 5mg  = 7.5mg  on seventh day of week). The dose will be increased to 70mg  warfarin total weekly dose (as 2x5mg  = 10mg ) by mouth daily. This [email or web address] after visit summary has been faxed to the number that accompanied the documentation. They have been instructed to provide these instructions to patient when he appears at HD next. Re-collect INR on 13-JUL-15.

## 2014-06-08 NOTE — Telephone Encounter (Signed)
Fax from [facility] with report of labs on pt. Collected 6/22 and received 6/23 PT 21.5 INR 1.96  Results given to Dr Elie Confer. He will send orders to Fax # [phone]

## 2014-06-12 ENCOUNTER — Encounter (HOSPITAL_COMMUNITY): Payer: Self-pay | Admitting: Emergency Medicine

## 2014-06-12 DIAGNOSIS — S0280XA Fracture of other specified skull and facial bones, unspecified side, initial encounter for closed fracture: Secondary | ICD-10-CM | POA: Diagnosis not present

## 2014-06-12 DIAGNOSIS — I10 Essential (primary) hypertension: Secondary | ICD-10-CM | POA: Diagnosis not present

## 2014-06-12 DIAGNOSIS — R220 Localized swelling, mass and lump, head: Secondary | ICD-10-CM | POA: Diagnosis not present

## 2014-06-12 DIAGNOSIS — S0990XA Unspecified injury of head, initial encounter: Secondary | ICD-10-CM | POA: Diagnosis not present

## 2014-06-12 DIAGNOSIS — R221 Localized swelling, mass and lump, neck: Secondary | ICD-10-CM | POA: Diagnosis not present

## 2014-06-12 DIAGNOSIS — S02400A Malar fracture unspecified, initial encounter for closed fracture: Secondary | ICD-10-CM | POA: Diagnosis not present

## 2014-06-12 DIAGNOSIS — S02401A Maxillary fracture, unspecified, initial encounter for closed fracture: Secondary | ICD-10-CM | POA: Diagnosis not present

## 2014-06-12 DIAGNOSIS — S022XXA Fracture of nasal bones, initial encounter for closed fracture: Secondary | ICD-10-CM | POA: Diagnosis not present
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
## 2014-06-13 ENCOUNTER — Encounter (HOSPITAL_COMMUNITY): Payer: Self-pay | Admitting: Emergency Medicine

## 2014-06-13 ENCOUNTER — Emergency Department (HOSPITAL_COMMUNITY)
Admission: EM | Admit: 2014-06-13 | Discharge: 2014-06-13 | Disposition: A | Discharge status: Home / Self Care | Payer: Medicare Other | Attending: Emergency Medicine | Admitting: Emergency Medicine

## 2014-06-13 ENCOUNTER — Emergency Department (HOSPITAL_COMMUNITY): Payer: Medicare Other

## 2014-06-13 ENCOUNTER — Emergency Department (HOSPITAL_COMMUNITY)
Admission: EM | Admit: 2014-06-13 | Discharge: 2014-06-13 | Discharge status: Against Medical Advice | Payer: Medicare Other | Attending: Emergency Medicine | Admitting: Emergency Medicine

## 2014-06-13 DIAGNOSIS — G8929 Other chronic pain: Secondary | ICD-10-CM | POA: Insufficient documentation

## 2014-06-13 DIAGNOSIS — S02401A Maxillary fracture, unspecified, initial encounter for closed fracture: Secondary | ICD-10-CM | POA: Insufficient documentation

## 2014-06-13 DIAGNOSIS — S0280XA Fracture of other specified skull and facial bones, unspecified side, initial encounter for closed fracture: Secondary | ICD-10-CM | POA: Diagnosis not present

## 2014-06-13 DIAGNOSIS — Z21 Asymptomatic human immunodeficiency virus [HIV] infection status: Secondary | ICD-10-CM | POA: Insufficient documentation

## 2014-06-13 DIAGNOSIS — I129 Hypertensive chronic kidney disease with stage 1 through stage 4 chronic kidney disease, or unspecified chronic kidney disease: Secondary | ICD-10-CM | POA: Diagnosis not present

## 2014-06-13 DIAGNOSIS — N184 Chronic kidney disease, stage 4 (severe): Secondary | ICD-10-CM | POA: Diagnosis not present

## 2014-06-13 DIAGNOSIS — Z8701 Personal history of pneumonia (recurrent): Secondary | ICD-10-CM | POA: Insufficient documentation

## 2014-06-13 DIAGNOSIS — R011 Cardiac murmur, unspecified: Secondary | ICD-10-CM | POA: Insufficient documentation

## 2014-06-13 DIAGNOSIS — S02400A Malar fracture unspecified, initial encounter for closed fracture: Secondary | ICD-10-CM | POA: Insufficient documentation

## 2014-06-13 DIAGNOSIS — Z862 Personal history of diseases of the blood and blood-forming organs and certain disorders involving the immune mechanism: Secondary | ICD-10-CM | POA: Insufficient documentation

## 2014-06-13 DIAGNOSIS — F17200 Nicotine dependence, unspecified, uncomplicated: Secondary | ICD-10-CM | POA: Diagnosis not present

## 2014-06-13 DIAGNOSIS — Z79899 Other long term (current) drug therapy: Secondary | ICD-10-CM | POA: Insufficient documentation

## 2014-06-13 DIAGNOSIS — S023XXA Fracture of orbital floor, initial encounter for closed fracture: Secondary | ICD-10-CM

## 2014-06-13 DIAGNOSIS — R52 Pain, unspecified: Secondary | ICD-10-CM | POA: Diagnosis not present

## 2014-06-13 DIAGNOSIS — Z8639 Personal history of other endocrine, nutritional and metabolic disease: Secondary | ICD-10-CM | POA: Diagnosis not present

## 2014-06-13 DIAGNOSIS — S0993XA Unspecified injury of face, initial encounter: Secondary | ICD-10-CM | POA: Diagnosis not present

## 2014-06-13 DIAGNOSIS — M549 Dorsalgia, unspecified: Secondary | ICD-10-CM | POA: Diagnosis not present

## 2014-06-13 DIAGNOSIS — I10 Essential (primary) hypertension: Secondary | ICD-10-CM | POA: Diagnosis not present

## 2014-06-13 DIAGNOSIS — Z86711 Personal history of pulmonary embolism: Secondary | ICD-10-CM | POA: Insufficient documentation

## 2014-06-13 DIAGNOSIS — Z8669 Personal history of other diseases of the nervous system and sense organs: Secondary | ICD-10-CM | POA: Diagnosis not present

## 2014-06-13 DIAGNOSIS — Z7901 Long term (current) use of anticoagulants: Secondary | ICD-10-CM | POA: Diagnosis not present

## 2014-06-13 DIAGNOSIS — S0990XA Unspecified injury of head, initial encounter: Secondary | ICD-10-CM | POA: Diagnosis not present

## 2014-06-13 DIAGNOSIS — S022XXA Fracture of nasal bones, initial encounter for closed fracture: Secondary | ICD-10-CM | POA: Diagnosis not present

## 2014-06-13 MED ORDER — OXYCODONE-ACETAMINOPHEN 5-325 MG PO TABS
2.0000 | ORAL_TABLET | Freq: Once | ORAL | Status: AC
  Administered 2014-06-13: 2 via ORAL
  Filled 2014-06-13: qty 2

## 2014-06-13 MED ORDER — CEPHALEXIN 500 MG PO CAPS: 500.0000 mg | ORAL_CAPSULE | Freq: Three times a day (TID) | ORAL | Status: DC

## 2014-06-13 NOTE — ED Notes (Signed)
No answer in waiting area.
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
## 2014-06-13 NOTE — Discharge Instructions (Signed)
Facial Fracture A facial fracture is a break in one of the bones of your face. HOME CARE INSTRUCTIONS   Protect the injured part of your face until it is healed.  Do not participate in activities which give chance for re-injury until your doctor approves.  Gently wash and dry your face.  Wear head and facial protection while riding a bicycle, motorcycle, or snowmobile. SEEK MEDICAL CARE IF:   An oral temperature above 102 F (38.9 C) develops.  You have severe headaches or notice changes in your vision.  You have new numbness or tingling in your face.  You develop nausea (feeling sick to your stomach), vomiting or a stiff neck. SEEK IMMEDIATE MEDICAL CARE IF:   You develop difficulty seeing or experience double vision.  You become dizzy, lightheaded, or faint.  You develop trouble speaking, breathing, or swallowing.  You have a watery discharge from your nose or ear. MAKE SURE YOU:   Understand these instructions.  Will watch your condition.  Will get help right away if you are not doing well or get worse. Document Released: 12/02/2005 Document Revised: 02/24/2012 Document Reviewed: 07/21/2008 [facility] Patient Information 2015 [place], [place]. This information is not intended to replace advice given to you by your health care provider. Make sure you discuss any questions you have with your health care provider.
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
## 2014-06-13 NOTE — ED Notes (Signed)
Patient transported to CT 

## 2014-06-14 DIAGNOSIS — N186 End stage renal disease: Secondary | ICD-10-CM | POA: Diagnosis not present

## 2014-06-15 DIAGNOSIS — D631 Anemia in chronic kidney disease: Secondary | ICD-10-CM | POA: Diagnosis not present

## 2014-06-15 DIAGNOSIS — N186 End stage renal disease: Secondary | ICD-10-CM | POA: Diagnosis not present

## 2014-06-15 DIAGNOSIS — D509 Iron deficiency anemia, unspecified: Secondary | ICD-10-CM | POA: Diagnosis not present

## 2014-06-15 DIAGNOSIS — N2581 Secondary hyperparathyroidism of renal origin: Secondary | ICD-10-CM | POA: Diagnosis not present

## 2014-06-20 ENCOUNTER — Other Ambulatory Visit: Payer: Self-pay | Admitting: *Deleted

## 2014-06-21 ENCOUNTER — Other Ambulatory Visit: Payer: Medicare Other

## 2014-06-21 ENCOUNTER — Other Ambulatory Visit: Payer: Self-pay | Admitting: Internal Medicine

## 2014-06-21 DIAGNOSIS — B20 Human immunodeficiency virus [HIV] disease: Secondary | ICD-10-CM

## 2014-06-21 MED ORDER — OXYCODONE-ACETAMINOPHEN 10-325 MG PO TABS: 1.0000 | ORAL_TABLET | Freq: Three times a day (TID) | ORAL | Status: DC | PRN

## 2014-06-22 DIAGNOSIS — I82509 Chronic embolism and thrombosis of unspecified deep veins of unspecified lower extremity: Secondary | ICD-10-CM | POA: Diagnosis not present

## 2014-06-22 LAB — T-HELPER CELL (CD4) - (RCID CLINIC ONLY)
CD4 % Helper T Cell: 43 % (ref 33–55)
CD4 T Cell Abs: 1020 /uL (ref 400–2700)

## 2014-06-22 LAB — PROTIME-INR

## 2014-06-22 NOTE — Telephone Encounter (Signed)
Pt aware.

## 2014-06-23 LAB — HIV-1 RNA QUANT-NO REFLEX-BLD
HIV 1 RNA Quant: <20 {copies}/mL (ref <20)
HIV-1 RNA Quant, Log: <1.3 {Log} (ref <1.30)

## 2014-06-24 ENCOUNTER — Telehealth: Payer: Self-pay | Admitting: Pharmacist

## 2014-06-24 NOTE — Telephone Encounter (Signed)
OP Triage Nurse Edd Fabian calls from [facility] citing a faxed INR result from [facility] on patient = 1.95 on last documented regimen of warfarin 2x5mg  (10mg ) warfarin PO every day. Have documented within [facility] program and faxed back new dosing instructions for HD Center to give to patient. He is a MWF HD patient. Will increase to 12.5mg  MWF (2&1/2 x 5mg ); 10mg  (2x5mg )  SuTuThSa; repeat INR on 27-JUL-15.

## 2014-06-24 NOTE — Telephone Encounter (Signed)
Spoke with patient who is currently at [facility]. Gave new verbal instructions and a hard copy print-out is being faxed to the HD center for them to provide to patient before he leaves. He indicates no bleeding complications, no new medications, no missed doses of warfarin.

## 2014-06-29 DIAGNOSIS — Z79899 Other long term (current) drug therapy: Secondary | ICD-10-CM | POA: Diagnosis not present

## 2014-06-29 DIAGNOSIS — S62639A Displaced fracture of distal phalanx of unspecified finger, initial encounter for closed fracture: Secondary | ICD-10-CM | POA: Diagnosis not present

## 2014-06-29 DIAGNOSIS — F17200 Nicotine dependence, unspecified, uncomplicated: Secondary | ICD-10-CM | POA: Diagnosis not present

## 2014-06-29 DIAGNOSIS — Z86711 Personal history of pulmonary embolism: Secondary | ICD-10-CM | POA: Diagnosis not present

## 2014-06-29 DIAGNOSIS — Z86718 Personal history of other venous thrombosis and embolism: Secondary | ICD-10-CM | POA: Diagnosis not present

## 2014-06-29 DIAGNOSIS — S62609A Fracture of unspecified phalanx of unspecified finger, initial encounter for closed fracture: Secondary | ICD-10-CM | POA: Diagnosis not present

## 2014-06-29 DIAGNOSIS — Z7901 Long term (current) use of anticoagulants: Secondary | ICD-10-CM | POA: Diagnosis not present

## 2014-07-04 ENCOUNTER — Telehealth: Payer: Self-pay | Admitting: *Deleted

## 2014-07-04 NOTE — Telephone Encounter (Signed)
Patient is waiting to have his dental surgery cleared.  Patient states that it was sent 06/27/14, is wondering if it has been completed yet.  RN unable to find the form, called the surgeon's office at [phone] and asked them to resend it.  Patient has an appointment 7/21 with Dr. Megan Salon. Landis Gandy, RN

## 2014-07-05 ENCOUNTER — Ambulatory Visit (INDEPENDENT_AMBULATORY_CARE_PROVIDER_SITE_OTHER): Payer: Medicare Other | Admitting: Internal Medicine

## 2014-07-05 ENCOUNTER — Encounter: Payer: Self-pay | Admitting: Internal Medicine

## 2014-07-05 VITALS — BP 160/95 | HR 88 | Temp 98.3°F | Wt 175.8 lb

## 2014-07-05 DIAGNOSIS — Z79899 Other long term (current) drug therapy: Secondary | ICD-10-CM

## 2014-07-05 DIAGNOSIS — B20 Human immunodeficiency virus [HIV] disease: Secondary | ICD-10-CM

## 2014-07-05 DIAGNOSIS — N529 Male erectile dysfunction, unspecified: Secondary | ICD-10-CM | POA: Diagnosis not present

## 2014-07-05 DIAGNOSIS — N521 Erectile dysfunction due to diseases classified elsewhere: Secondary | ICD-10-CM

## 2014-07-05 MED ORDER — SILDENAFIL CITRATE 25 MG PO TABS: ORAL_TABLET | ORAL | Status: DC
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
## 2014-07-13 DIAGNOSIS — Z86718 Personal history of other venous thrombosis and embolism: Secondary | ICD-10-CM | POA: Diagnosis not present

## 2014-07-13 DIAGNOSIS — D689 Coagulation defect, unspecified: Secondary | ICD-10-CM | POA: Diagnosis not present

## 2014-07-14 ENCOUNTER — Ambulatory Visit (INDEPENDENT_AMBULATORY_CARE_PROVIDER_SITE_OTHER): Payer: Medicare Other | Admitting: Internal Medicine

## 2014-07-14 ENCOUNTER — Encounter: Payer: Self-pay | Admitting: Internal Medicine

## 2014-07-14 VITALS — BP 146/86 | HR 83 | Temp 96.9°F | Ht >= 80 in | Wt 181.0 lb

## 2014-07-14 DIAGNOSIS — K089 Disorder of teeth and supporting structures, unspecified: Secondary | ICD-10-CM

## 2014-07-14 DIAGNOSIS — I5022 Chronic systolic (congestive) heart failure: Secondary | ICD-10-CM

## 2014-07-14 DIAGNOSIS — B20 Human immunodeficiency virus [HIV] disease: Secondary | ICD-10-CM | POA: Diagnosis not present

## 2014-07-14 NOTE — Patient Instructions (Signed)
1. We will send the necessary paperwork to the Oral surgeon.  Someone will call you with your appointment info for the Oral surgeon and Cardiologist.   2. Please take all medications as prescribed.      3. If you have worsening of your symptoms or new symptoms arise, please call the clinic [phone]), or go to the ER immediately if symptoms are severe.

## 2014-07-14 NOTE — Progress Notes (Signed)
   Subjective:    Patient ID: Brian [name], male    DOB: 30-Jul-1976, 38 y.o.   MRN: [id number]  HPI Comments: Brian [name] is a 38 year old male with a PMH of HIV (CD4 1020, VL <04 July 2014), polycystic kidney disease now ESRD on HD, hx of DVT and PE (on lifelong Coumadin) here for pre-op evaluation and referral to Oral Surgeon for extraction of all teeth and alveoloplasty under general anesthesia.  Chronic dental pain, no acute pain or swelling.  Has had surgery before without complications due to anesthestia.  He is a current smoker.       Review of Systems  Constitutional: Negative for fever, chills and appetite change.  Eyes: Negative for visual disturbance.  Respiratory: Negative for shortness of breath.   Cardiovascular: Negative for chest pain.  Gastrointestinal: Positive for diarrhea and constipation. Negative for nausea and vomiting.       Occasional  Genitourinary: Negative for dysuria and frequency.  Neurological: Negative for syncope and light-headedness.       Objective:   Physical Exam  Vitals reviewed. Constitutional: He is oriented to person, place, and time. He appears well-developed. No distress.  HENT:  Head: Normocephalic and atraumatic.  Mouth/Throat: Oropharynx is clear and moist. No oropharyngeal exudate.  Poor dentition  Eyes: EOM are normal. Pupils are equal, round, and reactive to light.  Cardiovascular: Normal rate and regular rhythm.  Exam reveals no gallop and no friction rub.   No murmur heard. Pulmonary/Chest: Effort normal and breath sounds normal. No respiratory distress. He has no wheezes. He has no rales.  Abdominal: Soft. Bowel sounds are normal. He exhibits no distension. There is no tenderness.  Umbilical hernia  Lymphadenopathy:    He has no cervical adenopathy.  Neurological: He is alert and oriented to person, place, and time. No cranial nerve deficit.  Skin: Skin is warm. He is not diaphoretic.  Psychiatric: He has a normal mood and  affect. His behavior is normal.          Assessment & Plan:  Please see problem based assessment and plan.

## 2014-07-15 ENCOUNTER — Encounter: Payer: Self-pay | Admitting: Internal Medicine

## 2014-07-15 DIAGNOSIS — K089 Disorder of teeth and supporting structures, unspecified: Secondary | ICD-10-CM | POA: Insufficient documentation

## 2014-07-15 DIAGNOSIS — N186 End stage renal disease: Secondary | ICD-10-CM | POA: Diagnosis not present

## 2014-07-15 LAB — PROTIME-INR
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
## 2014-07-15 NOTE — Assessment & Plan Note (Addendum)
He had an EF of 35-40% on ECHO in Jan 2015.  Last seen by Cardiology in Jan 2015.   Not currently on ACEI, BB or diuretic.  He says lisinopril was stopped shortly after he started HD 2/2 to low BP.   - I advised him to follow-up with Cardiology  - no current symptoms to suggest worsening disease; he says he feels great since starting HD - ordered referral to cardiology

## 2014-07-18 ENCOUNTER — Encounter: Payer: Self-pay | Admitting: Licensed Clinical Social Worker

## 2014-07-18 DIAGNOSIS — N039 Chronic nephritic syndrome with unspecified morphologic changes: Secondary | ICD-10-CM | POA: Diagnosis not present

## 2014-07-18 DIAGNOSIS — D509 Iron deficiency anemia, unspecified: Secondary | ICD-10-CM | POA: Diagnosis not present

## 2014-07-18 DIAGNOSIS — N186 End stage renal disease: Secondary | ICD-10-CM | POA: Diagnosis not present

## 2014-07-18 DIAGNOSIS — D631 Anemia in chronic kidney disease: Secondary | ICD-10-CM | POA: Diagnosis not present

## 2014-07-18 DIAGNOSIS — N2581 Secondary hyperparathyroidism of renal origin: Secondary | ICD-10-CM | POA: Diagnosis not present

## 2014-07-18 NOTE — Progress Notes (Signed)
Brian [name] has discharged CT from [facility] services this day. CT has completed all care plan goals. 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

## 2014-07-18 NOTE — Progress Notes (Signed)
Case discussed with Dr. Wilson at the time of the visit.  We reviewed the resident's history and exam and pertinent patient test results.  I agree with the assessment, diagnosis, and plan of care documented in the resident's note. 

## 2014-07-20 ENCOUNTER — Other Ambulatory Visit: Payer: Self-pay | Admitting: Internal Medicine

## 2014-07-21 ENCOUNTER — Other Ambulatory Visit: Payer: Self-pay | Admitting: Internal Medicine

## 2014-07-21 MED ORDER — OXYCODONE-ACETAMINOPHEN 10-325 MG PO TABS: 1.0000 | ORAL_TABLET | Freq: Three times a day (TID) | ORAL | Status: DC | PRN

## 2014-07-21 NOTE — Telephone Encounter (Signed)
Pain meds due on 8/8 Call when ready @ [phone]

## 2014-07-27 DIAGNOSIS — I82509 Chronic embolism and thrombosis of unspecified deep veins of unspecified lower extremity: Secondary | ICD-10-CM | POA: Diagnosis not present

## 2014-08-01 ENCOUNTER — Telehealth: Payer: Self-pay | Admitting: *Deleted

## 2014-08-01 NOTE — Telephone Encounter (Addendum)
Spoke with Dr. Christell Constant about patient and Coumadin prior to surgery.  Dr. Elie Confer wants patient to stop his Coumadin 5 days prior to the procedure.   No bridging will be needed according to Dr. Elie Confer.  Sander Nephew, RN 08/01/2014 5:08 PM

## 2014-08-01 NOTE — Telephone Encounter (Signed)
Patient notified that his patient assistance medication, Viagra has arrived and is ready for pick up. Lot # [id number], exp. 05/15/18. Myrtis Hopping

## 2014-08-03 NOTE — Telephone Encounter (Signed)
Spoke with Dr. Eppie Gibson about restart of  patient's Coumadin.   Order given per Dr. Eppie Gibson for patient to restart Coumadin the day after procedure at regular dose. Sander Nephew, RN  08/03/2014 12:08 PM

## 2014-08-05 ENCOUNTER — Encounter (HOSPITAL_COMMUNITY): Payer: Self-pay

## 2014-08-05 ENCOUNTER — Encounter (HOSPITAL_COMMUNITY)
Admission: RE | Admit: 2014-08-05 | Discharge: 2014-08-05 | Disposition: A | Discharge status: Home / Self Care | Payer: Medicare Other | Source: Ambulatory Visit | Attending: Oral Surgery | Admitting: Oral Surgery

## 2014-08-05 ENCOUNTER — Encounter (HOSPITAL_COMMUNITY): Payer: Self-pay | Admitting: Emergency Medicine

## 2014-08-05 DIAGNOSIS — K029 Dental caries, unspecified: Secondary | ICD-10-CM | POA: Insufficient documentation

## 2014-08-05 DIAGNOSIS — Z01818 Encounter for other preprocedural examination: Secondary | ICD-10-CM | POA: Insufficient documentation

## 2014-08-05 DIAGNOSIS — K0530 Chronic periodontitis, unspecified: Secondary | ICD-10-CM | POA: Diagnosis not present

## 2014-08-05 LAB — BASIC METABOLIC PANEL
Anion gap: 18 — ABNORMAL HIGH (ref 5–15)
BUN: 62 mg/dL — ABNORMAL HIGH (ref 6–23)
CALCIUM: 9.6 mg/dL (ref 8.4–10.5)
CO2: 25 meq/L (ref 19–32)
Chloride: 92 mEq/L — ABNORMAL LOW (ref 96–112)
Creatinine, Ser: 8.42 mg/dL — ABNORMAL HIGH (ref 0.50–1.35)
GFR calc Af Amer: 8 mL/min — ABNORMAL LOW (ref >90)
GFR calc non Af Amer: 7 mL/min — ABNORMAL LOW (ref >90)
Glucose, Bld: 79 mg/dL (ref 70–99)
Potassium: 5.9 mEq/L — ABNORMAL HIGH (ref 3.7–5.3)
SODIUM: 135 meq/L — AB (ref 137–147)

## 2014-08-05 LAB — CBC
HCT: 32.2 % — ABNORMAL LOW (ref 39.0–52.0)
Hemoglobin: 10.6 g/dL — ABNORMAL LOW (ref 13.0–17.0)
MCH: 37.1 pg — AB (ref 26.0–34.0)
MCHC: 32.9 g/dL (ref 30.0–36.0)
MCV: 112.6 fL — ABNORMAL HIGH (ref 78.0–100.0)
Platelets: 157 10*3/uL (ref 150–400)
RBC: 2.86 MIL/uL — ABNORMAL LOW (ref 4.22–5.81)
RDW: 13.3 % (ref 11.5–15.5)
WBC: 6.9 10*3/uL (ref 4.0–10.5)
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
## 2014-08-05 NOTE — Pre-Procedure Instructions (Signed)
Brian [name]  08/05/2014   Your procedure is scheduled on:  08/09/14  Report to [facility] Admitting at 8 AM.  Call this number if you have problems the morning of surgery: [phone]   Remember:   Do not eat food or drink liquids after midnight.   Take these medicines the morning of surgery with A SIP OF WATER: all inhalers,oxycodone,abacavir   Do not wear jewelry, make-up or nail polish.  Do not wear lotions, powders, or perfumes. You may wear deodorant.  Do not shave 48 hours prior to surgery. Men may shave face and neck.  Do not bring valuables to the hospital.  [facility] is not responsible                  for any belongings or valuables.               Contacts, dentures or bridgework may not be worn into surgery.  Leave suitcase in the car. After surgery it may be brought to your room.  For patients admitted to the hospital, discharge time is determined by your                treatment team.               Patients discharged the day of surgery will not be allowed to drive  home.  Name and phone number of your driver: family  Special Instructions: Shower using CHG 2 nights before surgery and the night before surgery.  If you shower the day of surgery use CHG.  Use special wash - you have one bottle of CHG for all showers.  You should use approximately 1/3 of the bottle for each shower.   Please read over the following fact sheets that you were given: Pain Booklet, Coughing and Deep Breathing and Surgical Site Infection Prevention

## 2014-08-08 ENCOUNTER — Telehealth: Payer: Self-pay | Admitting: *Deleted

## 2014-08-08 MED ORDER — CEFAZOLIN SODIUM-DEXTROSE 2-3 GM-% IV SOLR: 2.0000 g | INTRAVENOUS | Status: DC

## 2014-08-08 NOTE — Progress Notes (Signed)
Follow up on chart from Friday regarding pt need for cardiology clearance prior to surgery. appt is scheduled for 9/1. Called spoke to jody at dr. Hoyt Koch office and she said she was planning to talk with dr. Hoyt Koch and would cancel once talked with him

## 2014-08-08 NOTE — Telephone Encounter (Signed)
Brian [name] has been approved for his viagra until 12-15-14.

## 2014-08-09 ENCOUNTER — Encounter (HOSPITAL_COMMUNITY): Admission: RE | Payer: Self-pay | Source: Ambulatory Visit

## 2014-08-09 ENCOUNTER — Ambulatory Visit (HOSPITAL_COMMUNITY): Admission: RE | Admit: 2014-08-09 | Payer: Medicare Other | Source: Ambulatory Visit | Admitting: Oral Surgery

## 2014-08-09 SURGERY — MULTIPLE EXTRACTION WITH ALVEOLOPLASTY
Anesthesia: General | Site: Mouth

## 2014-08-11 ENCOUNTER — Encounter: Payer: Self-pay | Admitting: Internal Medicine

## 2014-08-12 ENCOUNTER — Encounter (HOSPITAL_COMMUNITY): Payer: Self-pay | Admitting: Emergency Medicine

## 2014-08-12 ENCOUNTER — Emergency Department (HOSPITAL_COMMUNITY)
Admission: EM | Admit: 2014-08-12 | Discharge: 2014-08-12 | Disposition: A | Discharge status: Home / Self Care | Payer: Medicare Other | Attending: Emergency Medicine | Admitting: Emergency Medicine

## 2014-08-12 DIAGNOSIS — Q613 Polycystic kidney, unspecified: Secondary | ICD-10-CM | POA: Diagnosis not present

## 2014-08-12 DIAGNOSIS — F329 Major depressive disorder, single episode, unspecified: Secondary | ICD-10-CM | POA: Diagnosis not present

## 2014-08-12 DIAGNOSIS — F3289 Other specified depressive episodes: Secondary | ICD-10-CM | POA: Diagnosis not present

## 2014-08-12 DIAGNOSIS — R011 Cardiac murmur, unspecified: Secondary | ICD-10-CM | POA: Diagnosis not present

## 2014-08-12 DIAGNOSIS — Z86718 Personal history of other venous thrombosis and embolism: Secondary | ICD-10-CM | POA: Diagnosis not present

## 2014-08-12 DIAGNOSIS — I10 Essential (primary) hypertension: Secondary | ICD-10-CM | POA: Diagnosis not present

## 2014-08-12 DIAGNOSIS — D649 Anemia, unspecified: Secondary | ICD-10-CM | POA: Insufficient documentation

## 2014-08-12 DIAGNOSIS — Z7901 Long term (current) use of anticoagulants: Secondary | ICD-10-CM | POA: Insufficient documentation

## 2014-08-12 DIAGNOSIS — Z8719 Personal history of other diseases of the digestive system: Secondary | ICD-10-CM | POA: Diagnosis not present

## 2014-08-12 DIAGNOSIS — F17200 Nicotine dependence, unspecified, uncomplicated: Secondary | ICD-10-CM | POA: Insufficient documentation

## 2014-08-12 DIAGNOSIS — N184 Chronic kidney disease, stage 4 (severe): Secondary | ICD-10-CM | POA: Insufficient documentation

## 2014-08-12 DIAGNOSIS — G8929 Other chronic pain: Secondary | ICD-10-CM | POA: Diagnosis not present

## 2014-08-12 DIAGNOSIS — R1030 Lower abdominal pain, unspecified: Secondary | ICD-10-CM

## 2014-08-12 DIAGNOSIS — Z79899 Other long term (current) drug therapy: Secondary | ICD-10-CM | POA: Insufficient documentation

## 2014-08-12 DIAGNOSIS — Z8701 Personal history of pneumonia (recurrent): Secondary | ICD-10-CM | POA: Diagnosis not present

## 2014-08-12 DIAGNOSIS — Z21 Asymptomatic human immunodeficiency virus [HIV] infection status: Secondary | ICD-10-CM | POA: Diagnosis not present

## 2014-08-12 DIAGNOSIS — I129 Hypertensive chronic kidney disease with stage 1 through stage 4 chronic kidney disease, or unspecified chronic kidney disease: Secondary | ICD-10-CM | POA: Diagnosis not present

## 2014-08-12 DIAGNOSIS — R109 Unspecified abdominal pain: Secondary | ICD-10-CM | POA: Insufficient documentation

## 2014-08-12 DIAGNOSIS — F411 Generalized anxiety disorder: Secondary | ICD-10-CM | POA: Diagnosis not present

## 2014-08-12 DIAGNOSIS — Z86711 Personal history of pulmonary embolism: Secondary | ICD-10-CM | POA: Insufficient documentation

## 2014-08-12 DIAGNOSIS — R319 Hematuria, unspecified: Secondary | ICD-10-CM | POA: Insufficient documentation

## 2014-08-12 LAB — URINALYSIS, ROUTINE W REFLEX MICROSCOPIC
BILIRUBIN URINE: NEGATIVE
Glucose, UA: 100 mg/dL — AB
Ketones, ur: NEGATIVE mg/dL
NITRITE: NEGATIVE
Protein, ur: >300 mg/dL — AB
SPECIFIC GRAVITY, URINE: 1.011 (ref 1.005–1.030)
UROBILINOGEN UA: 0.2 mg/dL (ref 0.0–1.0)
pH: 8.5 — ABNORMAL HIGH (ref 5.0–8.0)

## 2014-08-12 LAB — CBC WITH DIFFERENTIAL/PLATELET
BASOS ABS: 0 10*3/uL (ref 0.0–0.1)
BASOS PCT: 0 % (ref 0–1)
EOS ABS: 0 10*3/uL (ref 0.0–0.7)
EOS PCT: 1 % (ref 0–5)
HCT: 23.7 % — ABNORMAL LOW (ref 39.0–52.0)
Hemoglobin: 8 g/dL — ABNORMAL LOW (ref 13.0–17.0)
Lymphocytes Relative: 19 % (ref 12–46)
Lymphs Abs: 1.1 10*3/uL (ref 0.7–4.0)
MCH: 36.4 pg — AB (ref 26.0–34.0)
MCHC: 33.8 g/dL (ref 30.0–36.0)
MCV: 107.7 fL — AB (ref 78.0–100.0)
Monocytes Absolute: 0.5 10*3/uL (ref 0.1–1.0)
Monocytes Relative: 9 % (ref 3–12)
Neutro Abs: 4.2 10*3/uL (ref 1.7–7.7)
Neutrophils Relative %: 71 % (ref 43–77)
Platelets: 133 10*3/uL — ABNORMAL LOW (ref 150–400)
RBC: 2.2 MIL/uL — ABNORMAL LOW (ref 4.22–5.81)
RDW: 13.2 % (ref 11.5–15.5)
WBC: 5.9 10*3/uL (ref 4.0–10.5)

## 2014-08-12 LAB — URINE MICROSCOPIC-ADD ON

## 2014-08-12 LAB — I-STAT CHEM 8, ED
BUN: 40 mg/dL — ABNORMAL HIGH (ref 6–23)
Calcium, Ion: 1.09 mmol/L — ABNORMAL LOW (ref 1.12–1.23)
Chloride: 95 mEq/L — ABNORMAL LOW (ref 96–112)
Creatinine, Ser: 9.3 mg/dL — ABNORMAL HIGH (ref 0.50–1.35)
GLUCOSE: 93 mg/dL (ref 70–99)
HEMATOCRIT: 22 % — AB (ref 39.0–52.0)
Hemoglobin: 7.5 g/dL — ABNORMAL LOW (ref 13.0–17.0)
POTASSIUM: 4 meq/L (ref 3.7–5.3)
Sodium: 131 mEq/L — ABNORMAL LOW (ref 137–147)
TCO2: 26 mmol/L (ref 0–100)

## 2014-08-12 LAB — PROTIME-INR
INR: 2.12 — ABNORMAL HIGH (ref 0.00–1.49)
Prothrombin Time: 23.7 seconds — ABNORMAL HIGH (ref 11.6–15.2)

## 2014-08-12 LAB — POC OCCULT BLOOD, ED: Fecal Occult Bld: NEGATIVE

## 2014-08-12 LAB — TYPE AND SCREEN
ABO/RH(D): A POS
Antibody Screen: NEGATIVE

## 2014-08-12 MED ORDER — OXYCODONE-ACETAMINOPHEN 10-325 MG PO TABS: 1.0000 | ORAL_TABLET | Freq: Three times a day (TID) | ORAL | Status: DC | PRN

## 2014-08-12 MED ORDER — OXYCODONE-ACETAMINOPHEN 5-325 MG PO TABS
1.0000 | ORAL_TABLET | Freq: Once | ORAL | Status: AC
  Administered 2014-08-12: 1 via ORAL
  Filled 2014-08-12: qty 1

## 2014-08-12 MED ORDER — MORPHINE SULFATE 4 MG/ML IJ SOLN
4.0000 mg | Freq: Once | INTRAMUSCULAR | Status: AC
  Administered 2014-08-12: 4 mg via INTRAVENOUS
  Filled 2014-08-12: qty 1

## 2014-08-12 NOTE — ED Provider Notes (Signed)
Medical screening examination/treatment/procedure(s) were performed by non-physician practitioner and as supervising physician I was immediately available for consultation/collaboration.   EKG Interpretation None      Rolland Porter, MD, Abram Sander   Janice Norrie, MD 08/12/14 [phone]

## 2014-08-12 NOTE — ED Notes (Signed)
[place], PA at bedside 

## 2014-08-12 NOTE — ED Notes (Signed)
Pt given ice water.

## 2014-08-12 NOTE — ED Notes (Signed)
Per EMS, pt comes from dialysis, did not have dialysis today. Pt A&OX4, NAD noted. Pt comes in with c/o bilateral flank pain. Pt had taken oxycodone 10-325mg  2hrs prior and did not relieve pain. Pt denies chest pain, or n/v/d.VSS: BP 148/88, P90, R16.

## 2014-08-12 NOTE — Discharge Instructions (Signed)
Your abdominal pain and hematuria is likely related to polycystic kidney disease. Take pain medication as needed. Please followup for outpatient dialysis at noon tomorrow at your dialysis Center. Follow up closely with your primary care Dr. for further management. Return if the condition worsen or if you have any other concern.  Polycystic Kidney Disease Polycystic kidney disease is a disease in which the kidneys grow many small fluid-filled cysts. These cysts squeeze healthy kidney tissue, hurting the function of the kidneys. Polycystic kidney disease is present at birth and often causes progressive loss of kidney function and high blood pressure (hypertension). In milder forms of the disease, kidney function may be adequate throughout life.  CAUSES  The condition is usually inherited from parents. SIGNS AND SYMPTOMS  Hypertension.   Not enough healthy red blood cells to carry adequate oxygen to your tissues (anemia).   Pain in middle and side of the back below ribs (flank pain). This can happen if a cyst is bleeding.   Blood in the urine.   Kidney failure.   Kidney stones.   Increased urination at night.   Liver disease and liver cysts.  DIAGNOSIS  Your health care provider will ask you about your history and symptoms and perform a physical exam. Tests may be done to diagnose the condition. They may include an ultrasound, CT scan, or an MRI scan. Sometimes chromosome studies are done to see if you have the genes to have polycystic kidneys.  TREATMENT   There is no treatment at this time to keep cysts from forming or getting larger.  Cysts may need to be drained with a needle if they are large, putting pressure on other organs, and further destroying kidney tissue. This may require surgery.  Hypertension may be treated with medicines. Treating hypertension slows down further damage to the kidneys.  If kidney failure occurs, dialysis or transplantation is used to treat the  disease. HOME CARE INSTRUCTIONS You should follow up with a kidney specialist (nephrologist) so that your kidney function is monitored. SEEK MEDICAL CARE IF:  You have severe pain in the flank area.  You have blood in your urine. Document Released: 08/27/2001 Document Revised: 12/07/2013 Document Reviewed: 05/06/2013 [facility] Patient Information 2015 [place], [place]. This information is not intended to replace advice given to you by your health care provider. Make sure you discuss any questions you have with your health care provider.
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
## 2014-08-13 LAB — URINE CULTURE
CULTURE: NO GROWTH
Colony Count: NO GROWTH

## 2014-08-15 ENCOUNTER — Encounter: Payer: Self-pay | Admitting: *Deleted

## 2014-08-15 ENCOUNTER — Other Ambulatory Visit: Payer: Self-pay | Admitting: *Deleted

## 2014-08-15 DIAGNOSIS — I2699 Other pulmonary embolism without acute cor pulmonale: Secondary | ICD-10-CM | POA: Insufficient documentation

## 2014-08-15 DIAGNOSIS — Q619 Cystic kidney disease, unspecified: Secondary | ICD-10-CM | POA: Insufficient documentation

## 2014-08-15 DIAGNOSIS — N186 End stage renal disease: Secondary | ICD-10-CM | POA: Diagnosis not present

## 2014-08-15 DIAGNOSIS — I82409 Acute embolism and thrombosis of unspecified deep veins of unspecified lower extremity: Secondary | ICD-10-CM | POA: Insufficient documentation

## 2014-08-15 DIAGNOSIS — N189 Chronic kidney disease, unspecified: Secondary | ICD-10-CM | POA: Insufficient documentation

## 2014-08-16 ENCOUNTER — Ambulatory Visit (INDEPENDENT_AMBULATORY_CARE_PROVIDER_SITE_OTHER): Payer: Medicare Other | Admitting: Cardiology

## 2014-08-16 ENCOUNTER — Encounter: Payer: Self-pay | Admitting: Cardiology

## 2014-08-16 ENCOUNTER — Encounter: Payer: Self-pay | Admitting: *Deleted

## 2014-08-16 VITALS — BP 142/70 | HR 97 | Ht 74.0 in | Wt 182.0 lb

## 2014-08-16 DIAGNOSIS — I10 Essential (primary) hypertension: Secondary | ICD-10-CM

## 2014-08-16 DIAGNOSIS — I5022 Chronic systolic (congestive) heart failure: Secondary | ICD-10-CM | POA: Diagnosis not present

## 2014-08-16 DIAGNOSIS — I712 Thoracic aortic aneurysm, without rupture, unspecified: Secondary | ICD-10-CM

## 2014-08-16 DIAGNOSIS — Z01810 Encounter for preprocedural cardiovascular examination: Secondary | ICD-10-CM

## 2014-08-16 DIAGNOSIS — I719 Aortic aneurysm of unspecified site, without rupture: Secondary | ICD-10-CM | POA: Diagnosis not present

## 2014-08-16 DIAGNOSIS — I422 Other hypertrophic cardiomyopathy: Secondary | ICD-10-CM | POA: Diagnosis not present

## 2014-08-16 DIAGNOSIS — R9431 Abnormal electrocardiogram [ECG] [EKG]: Secondary | ICD-10-CM
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
## 2014-08-16 NOTE — Patient Instructions (Signed)
Your physician recommends that you continue on your current medications as directed. Please refer to the Current Medication list given to you today.  Your physician has requested that you have an echocardiogram. Echocardiography is a painless test that uses sound waves to create images of your heart. It provides your doctor with information about the size and shape of your heart and how well your heart's chambers and valves are working. This procedure takes approximately one hour. There are no restrictions for this procedure.  Your physician wants you to follow-up in: [place] will receive a reminder letter in the mail two months in advance. If you don't receive a letter, please call our office to schedule the follow-up appointment.  DR NELSON HAS WRITTEN YOUR CLEARANCE LETTER TO BE ABLE TO RECEIVE YOUR DENTAL PROCEDURE

## 2014-08-17 DIAGNOSIS — D509 Iron deficiency anemia, unspecified: Secondary | ICD-10-CM | POA: Diagnosis not present

## 2014-08-17 DIAGNOSIS — N186 End stage renal disease: Secondary | ICD-10-CM | POA: Diagnosis not present

## 2014-08-17 DIAGNOSIS — N2581 Secondary hyperparathyroidism of renal origin: Secondary | ICD-10-CM | POA: Diagnosis not present

## 2014-08-17 DIAGNOSIS — D631 Anemia in chronic kidney disease: Secondary | ICD-10-CM | POA: Diagnosis not present

## 2014-08-17 DIAGNOSIS — N039 Chronic nephritic syndrome with unspecified morphologic changes: Secondary | ICD-10-CM | POA: Diagnosis not present

## 2014-08-19 ENCOUNTER — Other Ambulatory Visit: Payer: Self-pay | Admitting: Internal Medicine

## 2014-08-19 ENCOUNTER — Telehealth: Payer: Self-pay | Admitting: *Deleted

## 2014-08-19 NOTE — Telephone Encounter (Signed)
Will need MD approval to refill Trazodone.  Not currently on list of active medications.

## 2014-08-23 ENCOUNTER — Ambulatory Visit (HOSPITAL_COMMUNITY): Payer: Medicare Other | Attending: Interventional Cardiology | Admitting: Radiology

## 2014-08-23 ENCOUNTER — Other Ambulatory Visit: Payer: Self-pay | Admitting: Internal Medicine

## 2014-08-23 ENCOUNTER — Other Ambulatory Visit: Payer: Self-pay | Admitting: *Deleted

## 2014-08-23 ENCOUNTER — Other Ambulatory Visit (HOSPITAL_COMMUNITY): Payer: Medicare Other

## 2014-08-23 DIAGNOSIS — I2699 Other pulmonary embolism without acute cor pulmonale: Secondary | ICD-10-CM | POA: Diagnosis not present

## 2014-08-23 DIAGNOSIS — N186 End stage renal disease: Secondary | ICD-10-CM | POA: Diagnosis not present

## 2014-08-23 DIAGNOSIS — I712 Thoracic aortic aneurysm, without rupture, unspecified: Secondary | ICD-10-CM

## 2014-08-23 DIAGNOSIS — I517 Cardiomegaly: Secondary | ICD-10-CM | POA: Insufficient documentation

## 2014-08-23 DIAGNOSIS — I059 Rheumatic mitral valve disease, unspecified: Secondary | ICD-10-CM | POA: Insufficient documentation

## 2014-08-23 DIAGNOSIS — F17200 Nicotine dependence, unspecified, uncomplicated: Secondary | ICD-10-CM | POA: Insufficient documentation

## 2014-08-23 DIAGNOSIS — B20 Human immunodeficiency virus [HIV] disease: Secondary | ICD-10-CM | POA: Insufficient documentation

## 2014-08-23 DIAGNOSIS — I5022 Chronic systolic (congestive) heart failure: Secondary | ICD-10-CM | POA: Diagnosis not present

## 2014-08-23 DIAGNOSIS — I10 Essential (primary) hypertension: Secondary | ICD-10-CM

## 2014-08-23 DIAGNOSIS — R011 Cardiac murmur, unspecified: Secondary | ICD-10-CM | POA: Diagnosis not present

## 2014-08-23 DIAGNOSIS — I719 Aortic aneurysm of unspecified site, without rupture: Secondary | ICD-10-CM

## 2014-08-23 MED ORDER — TRAZODONE HCL 50 MG PO TABS: 50.0000 mg | ORAL_TABLET | Freq: Every day | ORAL | Status: DC

## 2014-08-23 NOTE — Telephone Encounter (Signed)
Pt usually gets #90 but was seen in ED 8/28 and was given #10, was this in accordance with his pain contract, does he have a pain contract? #90 due 9/8

## 2014-08-23 NOTE — Telephone Encounter (Signed)
I refilled his trazodone.

## 2014-08-23 NOTE — Progress Notes (Signed)
Echocardiogram performed.  

## 2014-08-24 ENCOUNTER — Telehealth: Payer: Self-pay | Admitting: Internal Medicine

## 2014-08-24 NOTE — Telephone Encounter (Signed)
I called and spoke with Brian [name] regarding his anticoagulation perioperatively.  He is aware than he needs to stop Coumadin five days prior to his surgery (it has not been scheduled yet).  He understands he should resume Coumadin 24-72 hours after surgery (as soon as bleeding is controlled).  I have asked him to call the clinic if he has questions post-op.  He agrees to schedule a follow-up appointment with Dr. Elie Confer 1-2 weeks after surgery in order to check his INR.

## 2014-08-25 ENCOUNTER — Telehealth: Payer: Self-pay | Admitting: *Deleted

## 2014-08-25 NOTE — Telephone Encounter (Signed)
Contacted pt to inform him of his echo results showing stable dilation of the ascending aorta, however severely decreased LVEF of 25%, per Dr Meda Coffee.  Informed the pt that per Dr Meda Coffee, she recommends that he restart taking carvedilol 3.125 mg po BID and schedule a follow-up appt in 3 months.  Pt states he does not want to start on any medication, until his Nephrologist Dr Detterding reviews the echo and recommendations, and gives the ok.  Pt asked if we could fax his echo results and Dr Francesca Oman recommendations to [facility] phone # [phone] fax # [phone], and ATTN this to [facility] and Dr Detterding, to review and advise.  Informed pt that I will fax this information to the above mentioned and leave a detailed message to inform and follow-up.  Informed pt that our scheduling dept will be contacting him to have a f/u ov set up for 3 months with Dr Meda Coffee.  Informed pt that we will follow-up with him after Dr Detterding and nurse review and advise.  Pt verbalized understanding and agrees with this plan.  Pt very gracious for all the assistance provided.  Information faxed today 9/10 at 11:57 am.

## 2014-08-25 NOTE — Telephone Encounter (Signed)
Message copied by Nuala Alpha on Thu Aug 25, 2014 11:19 AM ------      Message from: Dorothy Spark      Created: Tue Aug 23, 2014  6:09 PM       Stable dilation of the ascending aorta, however severely decreased LVEF 25%, he needs to be restarted on carvedilol 3.125 mg po BID and follow up in 3 months ------

## 2014-08-26 ENCOUNTER — Other Ambulatory Visit: Payer: Self-pay | Admitting: Internal Medicine

## 2014-08-26 MED ORDER — OXYCODONE-ACETAMINOPHEN 10-325 MG PO TABS: 1.0000 | ORAL_TABLET | Freq: Three times a day (TID) | ORAL | Status: DC | PRN

## 2014-08-28 DIAGNOSIS — D631 Anemia in chronic kidney disease: Secondary | ICD-10-CM | POA: Diagnosis not present

## 2014-08-28 DIAGNOSIS — D649 Anemia, unspecified: Secondary | ICD-10-CM | POA: Diagnosis not present

## 2014-08-28 DIAGNOSIS — Q613 Polycystic kidney, unspecified: Secondary | ICD-10-CM | POA: Diagnosis not present

## 2014-08-28 DIAGNOSIS — R319 Hematuria, unspecified: Secondary | ICD-10-CM | POA: Diagnosis not present

## 2014-08-28 DIAGNOSIS — Z86718 Personal history of other venous thrombosis and embolism: Secondary | ICD-10-CM | POA: Diagnosis not present

## 2014-08-28 DIAGNOSIS — Z86711 Personal history of pulmonary embolism: Secondary | ICD-10-CM | POA: Diagnosis not present

## 2014-08-28 DIAGNOSIS — N390 Urinary tract infection, site not specified: Secondary | ICD-10-CM | POA: Diagnosis not present

## 2014-08-28 DIAGNOSIS — Z79899 Other long term (current) drug therapy: Secondary | ICD-10-CM | POA: Diagnosis not present

## 2014-08-28 DIAGNOSIS — N039 Chronic nephritic syndrome with unspecified morphologic changes: Secondary | ICD-10-CM | POA: Diagnosis not present

## 2014-08-28 DIAGNOSIS — Z992 Dependence on renal dialysis: Secondary | ICD-10-CM | POA: Diagnosis not present

## 2014-08-28 DIAGNOSIS — Z7901 Long term (current) use of anticoagulants: Secondary | ICD-10-CM | POA: Diagnosis not present

## 2014-08-28 DIAGNOSIS — F17200 Nicotine dependence, unspecified, uncomplicated: Secondary | ICD-10-CM | POA: Diagnosis not present

## 2014-08-28 DIAGNOSIS — N186 End stage renal disease: Secondary | ICD-10-CM | POA: Diagnosis not present

## 2014-08-31 ENCOUNTER — Encounter (HOSPITAL_COMMUNITY): Payer: Self-pay | Admitting: Pharmacy Technician

## 2014-08-31 ENCOUNTER — Telehealth: Payer: Self-pay | Admitting: *Deleted

## 2014-08-31 MED ORDER — CARVEDILOL 3.125 MG PO TABS: 3.1250 mg | ORAL_TABLET | Freq: Two times a day (BID) | ORAL | Status: DC

## 2014-08-31 NOTE — Telephone Encounter (Signed)
Message copied by Nuala Alpha on Wed Aug 31, 2014  4:48 PM ------      Message from: Dorothy Spark      Created: Tue Aug 23, 2014  6:09 PM       Stable dilation of the ascending aorta, however severely decreased LVEF 25%, he needs to be restarted on carvedilol 3.125 mg po BID and follow up in 3 months ------

## 2014-08-31 NOTE — Telephone Encounter (Signed)
Contacted pt to inform him that his Nephrology office [facility], Dr Detterding, has not contacted our office to give the ok for the pt to take Carvedilol 3.125 mg po BID, for severely decreased LVEF of 25 % on recent echo.  Pt states that he wants to proceed with taking this medication and agrees with Dr Francesca Oman plan. Pt states when he goes back to see Dr Detterding on Friday, he will run this by him.  Also informed pt that Dr Meda Coffee would like for him to follow-up with her in 3 months.  Informed the pt that someone from our scheduling dept will be contacting him tomorrow to have this set up.  Pt agrees with this, for tomorrow is his day off from dialysis.  Confirmed pharmacy of choice with the pt.  Will route a message to [facility] to schedule appt.

## 2014-09-01 ENCOUNTER — Encounter (HOSPITAL_COMMUNITY)
Admission: RE | Admit: 2014-09-01 | Discharge: 2014-09-01 | Disposition: A | Discharge status: Home / Self Care | Payer: Medicare Other | Source: Ambulatory Visit | Attending: Oral Surgery | Admitting: Oral Surgery

## 2014-09-01 ENCOUNTER — Other Ambulatory Visit (HOSPITAL_COMMUNITY): Payer: Self-pay | Admitting: *Deleted

## 2014-09-01 ENCOUNTER — Encounter (HOSPITAL_COMMUNITY): Payer: Self-pay

## 2014-09-01 DIAGNOSIS — Z01818 Encounter for other preprocedural examination: Secondary | ICD-10-CM | POA: Insufficient documentation

## 2014-09-01 DIAGNOSIS — K029 Dental caries, unspecified: Secondary | ICD-10-CM | POA: Diagnosis not present

## 2014-09-01 DIAGNOSIS — M2607 Excessive tuberosity of jaw: Secondary | ICD-10-CM | POA: Insufficient documentation

## 2014-09-01 DIAGNOSIS — K006 Disturbances in tooth eruption: Secondary | ICD-10-CM | POA: Diagnosis not present
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
## 2014-09-01 NOTE — Pre-Procedure Instructions (Signed)
BERKLEE ZACHAR  09/01/2014   Your procedure is scheduled on:  Tuesday, September 06, 2014 at 10:10 AM.   Report to [facility] Entrance "A" Admitting Office at 8:10 AM.   Call this number if you have problems the morning of surgery: [phone]   Remember:   Do not eat food or drink liquids after midnight Monday, 09/05/14.   Take these medicines the morning of surgery with A SIP OF WATER: abacavir (ZIAGEN), lamivudine (EPIVIR), oxyCODONE-acetaminophen (PERCOCET) - if needed, albuterol (PROVENTIL HFA;VENTOLIN HFA) inhaler - if needed.  Follow physician order for stopping Coumadin. Stop Vitamins as of today.   Do not wear jewelry.  Do not wear lotions, powders, or cologne. You may wear deodorant.  Men may shave face and neck.  Do not bring valuables to the hospital.  [facility] is not responsible                  for any belongings or valuables.               Contacts, dentures or bridgework may not be worn into surgery.  Leave suitcase in the car. After surgery it may be brought to your room.  For patients admitted to the hospital, discharge time is determined by your                treatment team.               Patients discharged the day of surgery will not be allowed to drive home.     Please read over the following fact sheets that you were given: Pain Booklet, Coughing and Deep Breathing and Surgical Site Infection Prevention

## 2014-09-05 MED ORDER — CEFAZOLIN SODIUM-DEXTROSE 2-3 GM-% IV SOLR
2.0000 g | INTRAVENOUS | Status: AC
  Administered 2014-09-06: 2 g via INTRAVENOUS
  Filled 2014-09-05: qty 50

## 2014-09-06 ENCOUNTER — Encounter (HOSPITAL_COMMUNITY): Payer: Medicare Other | Admitting: Anesthesiology

## 2014-09-06 ENCOUNTER — Encounter (HOSPITAL_COMMUNITY): Payer: Self-pay | Admitting: *Deleted

## 2014-09-06 ENCOUNTER — Encounter (HOSPITAL_COMMUNITY)
Admission: RE | Disposition: A | Discharge status: Home / Self Care | Payer: Self-pay | Source: Ambulatory Visit | Attending: Oral Surgery

## 2014-09-06 ENCOUNTER — Ambulatory Visit (HOSPITAL_COMMUNITY)
Admission: RE | Admit: 2014-09-06 | Discharge: 2014-09-06 | Disposition: A | Discharge status: Home / Self Care | Payer: Medicare Other | Source: Ambulatory Visit | Attending: Oral Surgery | Admitting: Oral Surgery

## 2014-09-06 ENCOUNTER — Ambulatory Visit (HOSPITAL_COMMUNITY): Payer: Medicare Other | Admitting: Anesthesiology

## 2014-09-06 DIAGNOSIS — I2699 Other pulmonary embolism without acute cor pulmonale: Secondary | ICD-10-CM | POA: Diagnosis not present

## 2014-09-06 DIAGNOSIS — F17200 Nicotine dependence, unspecified, uncomplicated: Secondary | ICD-10-CM | POA: Diagnosis not present

## 2014-09-06 DIAGNOSIS — K011 Impacted teeth: Secondary | ICD-10-CM

## 2014-09-06 DIAGNOSIS — Z86718 Personal history of other venous thrombosis and embolism: Secondary | ICD-10-CM | POA: Insufficient documentation

## 2014-09-06 DIAGNOSIS — I129 Hypertensive chronic kidney disease with stage 1 through stage 4 chronic kidney disease, or unspecified chronic kidney disease: Secondary | ICD-10-CM | POA: Diagnosis not present

## 2014-09-06 DIAGNOSIS — D539 Nutritional anemia, unspecified: Secondary | ICD-10-CM

## 2014-09-06 DIAGNOSIS — K029 Dental caries, unspecified: Secondary | ICD-10-CM | POA: Diagnosis not present

## 2014-09-06 DIAGNOSIS — R011 Cardiac murmur, unspecified: Secondary | ICD-10-CM | POA: Diagnosis not present

## 2014-09-06 DIAGNOSIS — Z86711 Personal history of pulmonary embolism: Secondary | ICD-10-CM | POA: Diagnosis not present

## 2014-09-06 DIAGNOSIS — R0602 Shortness of breath: Secondary | ICD-10-CM | POA: Diagnosis not present

## 2014-09-06 DIAGNOSIS — M6208 Separation of muscle (nontraumatic), other site: Secondary | ICD-10-CM

## 2014-09-06 DIAGNOSIS — Z720 Tobacco use: Secondary | ICD-10-CM

## 2014-09-06 DIAGNOSIS — I712 Thoracic aortic aneurysm, without rupture, unspecified: Secondary | ICD-10-CM | POA: Diagnosis not present

## 2014-09-06 DIAGNOSIS — F1010 Alcohol abuse, uncomplicated: Secondary | ICD-10-CM | POA: Insufficient documentation

## 2014-09-06 DIAGNOSIS — Z79899 Other long term (current) drug therapy: Secondary | ICD-10-CM | POA: Diagnosis not present

## 2014-09-06 DIAGNOSIS — G8929 Other chronic pain: Secondary | ICD-10-CM | POA: Diagnosis not present

## 2014-09-06 DIAGNOSIS — Q613 Polycystic kidney, unspecified: Secondary | ICD-10-CM

## 2014-09-06 DIAGNOSIS — K469 Unspecified abdominal hernia without obstruction or gangrene: Secondary | ICD-10-CM

## 2014-09-06 DIAGNOSIS — F411 Generalized anxiety disorder: Secondary | ICD-10-CM | POA: Diagnosis not present

## 2014-09-06 DIAGNOSIS — K006 Disturbances in tooth eruption: Secondary | ICD-10-CM | POA: Diagnosis not present

## 2014-09-06 DIAGNOSIS — K089 Disorder of teeth and supporting structures, unspecified: Secondary | ICD-10-CM

## 2014-09-06 DIAGNOSIS — Z7901 Long term (current) use of anticoagulants: Secondary | ICD-10-CM

## 2014-09-06 DIAGNOSIS — N184 Chronic kidney disease, stage 4 (severe): Secondary | ICD-10-CM | POA: Diagnosis not present

## 2014-09-06 DIAGNOSIS — Z01810 Encounter for preprocedural cardiovascular examination: Secondary | ICD-10-CM

## 2014-09-06 DIAGNOSIS — Z7682 Awaiting organ transplant status: Secondary | ICD-10-CM

## 2014-09-06 DIAGNOSIS — K429 Umbilical hernia without obstruction or gangrene: Secondary | ICD-10-CM

## 2014-09-06 DIAGNOSIS — I719 Aortic aneurysm of unspecified site, without rupture: Secondary | ICD-10-CM

## 2014-09-06 DIAGNOSIS — K4090 Unilateral inguinal hernia, without obstruction or gangrene, not specified as recurrent: Secondary | ICD-10-CM

## 2014-09-06 DIAGNOSIS — I10 Essential (primary) hypertension: Secondary | ICD-10-CM

## 2014-09-06 DIAGNOSIS — M25073 Hemarthrosis, unspecified ankle: Secondary | ICD-10-CM

## 2014-09-06 DIAGNOSIS — K0530 Chronic periodontitis, unspecified: Secondary | ICD-10-CM

## 2014-09-06 DIAGNOSIS — I5022 Chronic systolic (congestive) heart failure: Secondary | ICD-10-CM

## 2014-09-06 DIAGNOSIS — Z21 Asymptomatic human immunodeficiency virus [HIV] infection status: Secondary | ICD-10-CM | POA: Diagnosis not present

## 2014-09-06 DIAGNOSIS — K649 Unspecified hemorrhoids: Secondary | ICD-10-CM

## 2014-09-06 DIAGNOSIS — M549 Dorsalgia, unspecified: Secondary | ICD-10-CM | POA: Insufficient documentation

## 2014-09-06 DIAGNOSIS — I82409 Acute embolism and thrombosis of unspecified deep veins of unspecified lower extremity: Secondary | ICD-10-CM

## 2014-09-06 DIAGNOSIS — Q619 Cystic kidney disease, unspecified: Secondary | ICD-10-CM

## 2014-09-06 DIAGNOSIS — A630 Anogenital (venereal) warts: Secondary | ICD-10-CM

## 2014-09-06 DIAGNOSIS — N189 Chronic kidney disease, unspecified: Secondary | ICD-10-CM

## 2014-09-06 DIAGNOSIS — M2601 Maxillary hyperplasia: Secondary | ICD-10-CM | POA: Insufficient documentation

## 2014-09-06 DIAGNOSIS — B20 Human immunodeficiency virus [HIV] disease: Secondary | ICD-10-CM

## 2014-09-06 DIAGNOSIS — N186 End stage renal disease: Secondary | ICD-10-CM

## 2014-09-06 DIAGNOSIS — N529 Male erectile dysfunction, unspecified: Secondary | ICD-10-CM

## 2014-09-06 DIAGNOSIS — D61818 Other pancytopenia: Secondary | ICD-10-CM

## 2014-09-06 DIAGNOSIS — Q446 Cystic disease of liver: Secondary | ICD-10-CM

## 2014-09-06 DIAGNOSIS — R9431 Abnormal electrocardiogram [ECG] [EKG]: Secondary | ICD-10-CM

## 2014-09-06 DIAGNOSIS — R310 Gross hematuria: Secondary | ICD-10-CM

## 2014-09-06 DIAGNOSIS — A318 Other mycobacterial infections: Secondary | ICD-10-CM

## 2014-09-06 HISTORY — PX: MULTIPLE EXTRACTIONS WITH ALVEOLOPLASTY: SHX5342

## 2014-09-06 LAB — POCT I-STAT 4, (NA,K, GLUC, HGB,HCT)
GLUCOSE: 98 mg/dL (ref 70–99)
HCT: 30 % — ABNORMAL LOW (ref 39.0–52.0)
HEMOGLOBIN: 10.2 g/dL — AB (ref 13.0–17.0)
Potassium: 4.4 mEq/L (ref 3.7–5.3)
Sodium: 135 mEq/L — ABNORMAL LOW (ref 137–147)

## 2014-09-06 LAB — PROTIME-INR
INR: 1.27 (ref 0.00–1.49)
Prothrombin Time: 15.9 seconds — ABNORMAL HIGH (ref 11.6–15.2)

## 2014-09-06 LAB — APTT: aPTT: 35 seconds (ref 24–37)

## 2014-09-06 SURGERY — MULTIPLE EXTRACTION WITH ALVEOLOPLASTY
Anesthesia: General | Site: Mouth

## 2014-09-06 MED ORDER — DEXAMETHASONE SODIUM PHOSPHATE 4 MG/ML IJ SOLN
INTRAMUSCULAR | Status: DC | PRN
  Administered 2014-09-06: 4 mg via INTRAVENOUS

## 2014-09-06 MED ORDER — FENTANYL CITRATE 0.05 MG/ML IJ SOLN
INTRAMUSCULAR | Status: AC
  Filled 2014-09-06: qty 5

## 2014-09-06 MED ORDER — ROCURONIUM BROMIDE 100 MG/10ML IV SOLN
INTRAVENOUS | Status: DC | PRN
  Administered 2014-09-06: 10 mg via INTRAVENOUS
  Administered 2014-09-06: 30 mg via INTRAVENOUS

## 2014-09-06 MED ORDER — FENTANYL CITRATE 0.05 MG/ML IJ SOLN
INTRAMUSCULAR | Status: DC | PRN
  Administered 2014-09-06 (×3): 100 ug via INTRAVENOUS
  Administered 2014-09-06: 50 ug via INTRAVENOUS

## 2014-09-06 MED ORDER — HYDROMORPHONE HCL 4 MG PO TABS: 4.0000 mg | ORAL_TABLET | ORAL | Status: DC | PRN

## 2014-09-06 MED ORDER — OXYCODONE HCL 5 MG/5ML PO SOLN: 5.0000 mg | Freq: Once | ORAL | Status: DC | PRN

## 2014-09-06 MED ORDER — SODIUM CHLORIDE 0.9 % IR SOLN
Status: DC | PRN
  Administered 2014-09-06: 1000 mL

## 2014-09-06 MED ORDER — OXYCODONE HCL 5 MG PO TABS: 5.0000 mg | ORAL_TABLET | Freq: Once | ORAL | Status: DC | PRN

## 2014-09-06 MED ORDER — LIDOCAINE HCL (CARDIAC) 20 MG/ML IV SOLN
INTRAVENOUS | Status: AC
  Filled 2014-09-06: qty 5

## 2014-09-06 MED ORDER — LIDOCAINE HCL (CARDIAC) 20 MG/ML IV SOLN
INTRAVENOUS | Status: DC | PRN
  Administered 2014-09-06: 80 mg via INTRAVENOUS

## 2014-09-06 MED ORDER — HYDROMORPHONE HCL 1 MG/ML IJ SOLN
INTRAMUSCULAR | Status: AC
  Filled 2014-09-06: qty 1

## 2014-09-06 MED ORDER — ROCURONIUM BROMIDE 50 MG/5ML IV SOLN
INTRAVENOUS | Status: AC
  Filled 2014-09-06: qty 1

## 2014-09-06 MED ORDER — LIDOCAINE-EPINEPHRINE 2 %-1:100000 IJ SOLN
INTRAMUSCULAR | Status: AC
  Filled 2014-09-06: qty 1

## 2014-09-06 MED ORDER — HYDROMORPHONE HCL 1 MG/ML IJ SOLN
0.2500 mg | INTRAMUSCULAR | Status: DC | PRN
  Administered 2014-09-06 (×4): 0.5 mg via INTRAVENOUS

## 2014-09-06 MED ORDER — PHENYLEPHRINE HCL 10 MG/ML IJ SOLN
INTRAMUSCULAR | Status: DC | PRN
  Administered 2014-09-06 (×2): 80 ug via INTRAVENOUS

## 2014-09-06 MED ORDER — PROPOFOL 10 MG/ML IV BOLUS
INTRAVENOUS | Status: DC | PRN
  Administered 2014-09-06: 50 mg via INTRAVENOUS
  Administered 2014-09-06: 150 mg via INTRAVENOUS

## 2014-09-06 MED ORDER — DEXAMETHASONE SODIUM PHOSPHATE 4 MG/ML IJ SOLN
INTRAMUSCULAR | Status: AC
  Filled 2014-09-06: qty 1

## 2014-09-06 MED ORDER — PROPOFOL 10 MG/ML IV BOLUS: INTRAVENOUS | Status: DC | PRN

## 2014-09-06 MED ORDER — ESMOLOL HCL 10 MG/ML IV SOLN
INTRAVENOUS | Status: DC | PRN
  Administered 2014-09-06 (×3): 10 mg via INTRAVENOUS

## 2014-09-06 MED ORDER — GLYCOPYRROLATE 0.2 MG/ML IJ SOLN
INTRAMUSCULAR | Status: DC | PRN
  Administered 2014-09-06: 0.6 mg via INTRAVENOUS

## 2014-09-06 MED ORDER — SODIUM CHLORIDE 0.9 % IV SOLN
INTRAVENOUS | Status: DC
  Administered 2014-09-06: 09:00:00 via INTRAVENOUS

## 2014-09-06 MED ORDER — ONDANSETRON HCL 4 MG/2ML IJ SOLN
INTRAMUSCULAR | Status: AC
  Filled 2014-09-06: qty 2

## 2014-09-06 MED ORDER — PROMETHAZINE HCL 25 MG/ML IJ SOLN: 6.2500 mg | INTRAMUSCULAR | Status: DC | PRN

## 2014-09-06 MED ORDER — ALBUMIN HUMAN 5 % IV SOLN
INTRAVENOUS | Status: DC | PRN
  Administered 2014-09-06: 11:00:00 via INTRAVENOUS

## 2014-09-06 MED ORDER — METOPROLOL TARTRATE 1 MG/ML IV SOLN
INTRAVENOUS | Status: AC
  Filled 2014-09-06: qty 5

## 2014-09-06 MED ORDER — MIDAZOLAM HCL 5 MG/5ML IJ SOLN
INTRAMUSCULAR | Status: DC | PRN
  Administered 2014-09-06: 2 mg via INTRAVENOUS

## 2014-09-06 MED ORDER — MIDAZOLAM HCL 2 MG/2ML IJ SOLN
INTRAMUSCULAR | Status: AC
  Filled 2014-09-06: qty 2

## 2014-09-06 MED ORDER — CLINDAMYCIN HCL 300 MG PO CAPS: 300.0000 mg | ORAL_CAPSULE | Freq: Three times a day (TID) | ORAL | Status: DC

## 2014-09-06 MED ORDER — LIDOCAINE-EPINEPHRINE 2 %-1:100000 IJ SOLN
INTRAMUSCULAR | Status: DC | PRN
  Administered 2014-09-06: 20 mL via INTRADERMAL

## 2014-09-06 MED ORDER — METOPROLOL TARTRATE 1 MG/ML IV SOLN
INTRAVENOUS | Status: DC | PRN
  Administered 2014-09-06 (×3): 1 mg via INTRAVENOUS

## 2014-09-06 MED ORDER — 0.9 % SODIUM CHLORIDE (POUR BTL) OPTIME
TOPICAL | Status: DC | PRN
  Administered 2014-09-06: 1000 mL

## 2014-09-06 MED ORDER — OXYMETAZOLINE HCL 0.05 % NA SOLN
NASAL | Status: DC | PRN
  Administered 2014-09-06: 1 via NASAL

## 2014-09-06 MED ORDER — PROPOFOL 10 MG/ML IV BOLUS
INTRAVENOUS | Status: AC
  Filled 2014-09-06: qty 20

## 2014-09-06 MED ORDER — NEOSTIGMINE METHYLSULFATE 10 MG/10ML IV SOLN
INTRAVENOUS | Status: DC | PRN
  Administered 2014-09-06: 4 mg via INTRAVENOUS

## 2014-09-06 MED ORDER — EPHEDRINE SULFATE 50 MG/ML IJ SOLN
INTRAMUSCULAR | Status: DC | PRN
  Administered 2014-09-06: 5 mg via INTRAVENOUS
  Administered 2014-09-06: 10 mg via INTRAVENOUS

## 2014-09-06 MED ORDER — LACTATED RINGERS IV SOLN: INTRAVENOUS | Status: DC | PRN

## 2014-09-06 MED ORDER — ONDANSETRON HCL 4 MG/2ML IJ SOLN
INTRAMUSCULAR | Status: DC | PRN
  Administered 2014-09-06: 4 mg via INTRAVENOUS

## 2014-09-06 SURGICAL SUPPLY — 31 items
BLADE 10 SAFETY STRL DISP (BLADE) ×3 IMPLANT
BUR CROSS CUT FISSURE 1.6 (BURR) ×2 IMPLANT
BUR CROSS CUT FISSURE 1.6MM (BURR) ×1
BUR EGG ELITE 4.0 (BURR) ×2 IMPLANT
BUR EGG ELITE 4.0MM (BURR) ×1
CANISTER SUCTION 2500CC (MISCELLANEOUS) ×3 IMPLANT
COVER SURGICAL LIGHT HANDLE (MISCELLANEOUS) ×3 IMPLANT
CRADLE DONUT ADULT HEAD (MISCELLANEOUS) ×3 IMPLANT
GAUZE PACKING FOLDED 2  STR (GAUZE/BANDAGES/DRESSINGS) ×2
GAUZE PACKING FOLDED 2 STR (GAUZE/BANDAGES/DRESSINGS) ×1 IMPLANT
GLOVE BIO SURGEON STRL SZ 6.5 (GLOVE) ×4 IMPLANT
GLOVE BIO SURGEON STRL SZ7.5 (GLOVE) ×3 IMPLANT
GLOVE BIO SURGEONS STRL SZ 6.5 (GLOVE) ×2
GLOVE BIOGEL PI IND STRL 7.0 (GLOVE) ×2 IMPLANT
GLOVE BIOGEL PI INDICATOR 7.0 (GLOVE) ×4
GOWN STRL REUS W/ TWL LRG LVL3 (GOWN DISPOSABLE) ×2 IMPLANT
GOWN STRL REUS W/ TWL XL LVL3 (GOWN DISPOSABLE) ×1 IMPLANT
GOWN STRL REUS W/TWL LRG LVL3 (GOWN DISPOSABLE) ×6
GOWN STRL REUS W/TWL XL LVL3 (GOWN DISPOSABLE) ×3
KIT BASIN OR (CUSTOM PROCEDURE TRAY) ×3 IMPLANT
KIT ROOM TURNOVER OR (KITS) ×3 IMPLANT
NEEDLE 22X1 1/2 (OR ONLY) (NEEDLE) ×3 IMPLANT
NS IRRIG 1000ML POUR BTL (IV SOLUTION) ×3 IMPLANT
PAD ARMBOARD 7.5X6 YLW CONV (MISCELLANEOUS) ×6 IMPLANT
SUT CHROMIC 3 0 PS 2 (SUTURE) ×6 IMPLANT
SYR CONTROL 10ML LL (SYRINGE) ×3 IMPLANT
SYRINGE 10CC LL (SYRINGE) ×4 IMPLANT
TOWEL OR 17X26 10 PK STRL BLUE (TOWEL DISPOSABLE) ×3 IMPLANT
TRAY ENT MC OR (CUSTOM PROCEDURE TRAY) ×3 IMPLANT
TUBING IRRIGATION (MISCELLANEOUS) ×3 IMPLANT
YANKAUER SUCT BULB TIP NO VENT (SUCTIONS) ×3 IMPLANT

## 2014-09-06 NOTE — Transfer of Care (Signed)
Immediate Anesthesia Transfer of Care Note  Patient: Brian [name]  Procedure(s) Performed: Procedure(s): MULTIPLE EXTRACTION WITH ALVEOLOPLASTY AND REMOVAL OF TORI (N/A)  Patient Location: PACU  Anesthesia Type:General  Level of Consciousness: awake, alert  and oriented  Airway & Oxygen Therapy: Patient Spontanous Breathing and Patient connected to nasal cannula oxygen  Post-op Assessment: Report given to PACU RN and Post -op Vital signs reviewed and stable  Post vital signs: Reviewed and stable  Complications: No apparent anesthesia complications

## 2014-09-06 NOTE — H&P (Signed)
H&P documentation  -History and Physical Reviewed  -Patient has been re-examined  -No change in the plan of care  Brian [name]  

## 2014-09-06 NOTE — Op Note (Signed)
NAME:  Brian [name], Brian [name]                ACCOUNT NO.:  [id number]  MEDICAL RECORD NO.:  [id number]  LOCATION:  [facility]                         FACILITY:  [facility]  PHYSICIAN:  [name], M.D.  DATE OF BIRTH:  [birth year]  DATE OF PROCEDURE:  09/06/2014 DATE OF DISCHARGE:  09/06/2014                              OPERATIVE REPORT   PREOPERATIVE DIAGNOSES:  Nonrestorable teeth #1, 4, 5, 6, 7, 8, 9, 10, 11, 12, 13, 14, 15, 16, 17, 18, 19, 20, 21, 22, 23, 24, 25, 26, 27, 28, 29, 30, 31, and impacted tooth #32.  Hyperplastic bilateral maxillary fibrous tuberosities.  POSTOPERATIVE DIAGNOSES:  Nonrestorable teeth #1, 4, 5, 6, 7, 8, 9, 10, 11, 12, 13, 14, 15, 16, 17, 18, 19, 20, 21, 22, 23, 24, 25, 26, 27, 28, 29, 30, 31, and impacted tooth #32 plus hyperplastic maxillary osseous tuberosities.  PROCEDURE:  Extraction of teeth #1, 4, 5, 6, 7, 8, 9, 10, 11, 12, 13, 14, 15, 16, 17, 18, 19, #20, #21, #22, #23, #24, #25, #26, #27, #28, #29, #30, #31, and #32.  Alveoplasty right and left maxilla and mandible, removal of bilateral maxillary hyperplastic tuberosity.  SURGEON:  [name], MD  ANESTHESIA:  General nasal, Dr. Migdalia Dk attending.  DESCRIPTION OF PROCEDURE:  The patient was taken to the operating room, placed on the table in the supine position.  General anesthesia was administered intravenously and a nasal endotracheal tube was placed and secured.  The eyes were protected.  The patient was draped for the procedure.  Time-out was performed.  The posterior pharynx was suctioned with a Yankauer suction.  The throat pack was placed.  A 2% lidocaine with 1:100,000 epinephrine was infiltrated in an inferior alveolar block on the right and left side and a buccal and palatal infiltration around the maxilla, a total of 20 mL was utilized.  The bite block was placed in the right side of the mouth, and a sweetheart retractor was used to retract the tongue.  A #15 blade was used to make an  incision first thing in the mandible with tooth #17 and carrying anteriorly across the midline in the gingival sulcus until tooth #27 was reached, this was done on the buccal and lingual aspects in the maxilla beginning at tooth #16.  A 15 blade was used to make an incision both buccally and palatally in the gingival sulcus and carried anteriorly across the midline to tooth #6.  The periosteum in the maxilla and mandible was reflected with a periosteal elevator.  The teeth were elevated with a 301 elevator and removed with the lower forceps in the mandible and upper forceps in the maxilla.  The sockets were curetted.  The periosteum was further reflected to expose the alveolar crest in both the maxilla and mandible, and it was observed that in the maxilla there was some bony outcropping both palatally and buccally.  These were osseous tuberosities and these were smoothed with the egg-shaped bur and bone file.  Then, alveoplasty was performed in the maxilla and mandible using the egg-shaped bur and bone file.  The lower arch was closed with 3-0 chromic.  Excessive fibrous tissue was  excised using a 15 blade in the posterior maxilla until the posterior margins approximated each other, then the maxilla on the left side was sutured with 3-0 chromic. The bite block was exchanged for a larger bite block, and the sweetheart was repositioned to the other side of the mouth.  The 15 blade was then used to make an incision beginning at tooth #1 carried to tooth #6 on the buccal and palatal aspects of the gingival sulcus and in the mandible.  An excision was made overlying tooth #32 which was impacted and carried forward buccally in the gingival sulcus until the tooth #27 was encountered and similar incision was made on the lingual surface of these teeth.  The periosteum was reflected in the maxilla and mandible using the periosteal elevator.  Bone was removed around tooth #32 and then the lower  teeth were elevated with a 301 elevator and removed from the mouth with the universal forceps.  Tooth #32 was removed with the 301 elevator in the maxilla.  The teeth were elevated with a 301 elevator and removed from the mouth with the upper universal forceps. The sockets were then curetted.  The periosteum was reflected to expose the alveolar crest.  The egg-shaped bur and bone file were used in the mandible to perform the alveoplasty, then, this was closed with 3-0 chromic in the maxilla.  The alveolar crest was exposed, and there were bony outcropping of the posterior maxilla consistent with osseous tuberosities.  These were reduced using the egg-shaped bur and bone file and then the alveoplasty was also performed on the remainder of the right maxilla using the egg-shaped bur and bone file.  Then, the areas in the posterior maxilla were examined.  Then, the fibrous portion of the tuberosity was excised until the wound incision approximated primarily.  Then, the right maxilla was closed with 3-0 chromic after irrigating.  The oral cavity was inspected and found to have good contour, hemostasis, and closure.  The oral cavity was irrigated and suctioned.  Throat pack was removed.  The patient was awakened, taken to the recovery room, breathing spontaneously in good condition.  ESTIMATED BLOOD LOSS:  Minimal.  COMPLICATIONS:  None.  SPECIMENS:  None.     [name], M.D.     SMJ/MEDQ  D:  09/06/2014  T:  09/06/2014  Job:  [id number]

## 2014-09-06 NOTE — Discharge Instructions (Signed)

## 2014-09-06 NOTE — Anesthesia Postprocedure Evaluation (Signed)
Anesthesia Post Note  Patient: Brian [name]  Procedure(s) Performed: Procedure(s) (LRB): MULTIPLE EXTRACTION WITH ALVEOLOPLASTY AND REMOVAL OF TORI (N/A)  Anesthesia type: general  Patient location: PACU  Post pain: Pain level controlled  Post assessment: Patient's Cardiovascular Status Stable  Last Vitals:  Filed Vitals:   09/06/14 1245  BP: 130/72  Pulse: 87  Temp: 36.9 C  Resp: 15    Post vital signs: Reviewed and stable  Level of consciousness: sedated  Complications: No apparent anesthesia complications

## 2014-09-06 NOTE — Op Note (Signed)
09/06/2014  11:08 AM  PATIENT:  Brian [name]  38 y.o. male  PRE-OPERATIVE DIAGNOSIS:  non-restorable teeth # 1, 4, 5, 6, 7, 8, 9, 10, 11, 12, 13, 14, 15, 16, 17, 18, 19, 20, 21, 22, 23, 24, 25, 26, 27, 28, 29, 30, 31, impacted tooth #32, Hyperplastic maxillary fibrous tuberosities  POST-OPERATIVE DIAGNOSIS:  SAME+ hyperplastic maxillary osseous tuberosities  PROCEDURE:  Procedure(s): MULTIPLE EXTRACTION teeth # 1, 4, 5, 6, 7, 8, 9, 10, 11, 12, 13, 14, 15, 16, 17, 18, 19, 20, 21, 22, 23, 24, 25, 26, 27, 28, 29, 30, 31,  32, WITH ALVEOLOPLASTY AND REMOVAL OF Bilateral maxillary tuberosity hyperplasia  SURGEON:  Surgeon(s): [name], DDS  ANESTHESIA:   local and general  EBL:  minimal  DRAINS: none   SPECIMEN:  No Specimen  COUNTS:  YES  PLAN OF CARE: Discharge to home after PACU  PATIENT DISPOSITION:  PACU - hemodynamically stable.   PROCEDURE DETAILS: Dictation # [id number]  [name], DMD 09/06/2014 11:08 AM

## 2014-09-06 NOTE — Anesthesia Preprocedure Evaluation (Addendum)
Anesthesia Evaluation  Patient identified by MRN, date of birth, ID band Patient awake    Reviewed: Allergy & Precautions, H&P , NPO status , Patient's Chart, lab work & pertinent test results  History of Anesthesia Complications Negative for: history of anesthetic complications  Airway Mallampati: II TM Distance: >3 FB Neck ROM: Full    Dental  (+) Poor Dentition, Dental Advisory Given   Pulmonary Current Smoker,    Pulmonary exam normal       Cardiovascular hypertension,  Contacted pt to inform him of his echo results showing stable dilation of the ascending aorta, however severely decreased LVEF of 25%, per Dr Meda Coffee.     Neuro/Psych PSYCHIATRIC DISORDERS Anxiety Depression negative neurological ROS     GI/Hepatic Neg liver ROS, hiatal hernia,   Endo/Other  negative endocrine ROS  Renal/GU Renal disease     Musculoskeletal   Abdominal   Peds  Hematology negative hematology ROS (+)   Anesthesia Other Findings   Reproductive/Obstetrics                          Anesthesia Physical Anesthesia Plan  ASA: III  Anesthesia Plan: General   Post-op Pain Management:    Induction: Intravenous  Airway Management Planned: Nasal ETT  Additional Equipment:   Intra-op Plan:   Post-operative Plan: Extubation in OR  Informed Consent: I have reviewed the patients History and Physical, chart, labs and discussed the procedure including the risks, benefits and alternatives for the proposed anesthesia with the patient or authorized representative who has indicated his/her understanding and acceptance.   Dental advisory given  Plan Discussed with: CRNA, Anesthesiologist and Surgeon  Anesthesia Plan Comments:         Anesthesia Quick Evaluation

## 2014-09-09 ENCOUNTER — Encounter (HOSPITAL_COMMUNITY): Payer: Self-pay | Admitting: Oral Surgery

## 2014-09-13 ENCOUNTER — Other Ambulatory Visit: Payer: Self-pay | Admitting: Internal Medicine

## 2014-09-14 DIAGNOSIS — N186 End stage renal disease: Secondary | ICD-10-CM | POA: Diagnosis not present

## 2014-09-16 DIAGNOSIS — D509 Iron deficiency anemia, unspecified: Secondary | ICD-10-CM | POA: Diagnosis not present

## 2014-09-16 DIAGNOSIS — N186 End stage renal disease: Secondary | ICD-10-CM | POA: Diagnosis not present

## 2014-09-16 DIAGNOSIS — Z23 Encounter for immunization: Secondary | ICD-10-CM | POA: Diagnosis not present

## 2014-09-16 DIAGNOSIS — D631 Anemia in chronic kidney disease: Secondary | ICD-10-CM | POA: Diagnosis not present

## 2014-09-16 DIAGNOSIS — N2581 Secondary hyperparathyroidism of renal origin: Secondary | ICD-10-CM | POA: Diagnosis not present

## 2014-09-19 ENCOUNTER — Other Ambulatory Visit: Payer: Self-pay | Admitting: *Deleted

## 2014-09-20 NOTE — Telephone Encounter (Signed)
Not on current list;called pt -this is his current pain medication.  Thanks

## 2014-09-22 MED ORDER — OXYCODONE-ACETAMINOPHEN 10-325 MG PO TABS: 1.0000 | ORAL_TABLET | ORAL | Status: DC | PRN

## 2014-09-28 DIAGNOSIS — Z86718 Personal history of other venous thrombosis and embolism: Secondary | ICD-10-CM | POA: Diagnosis not present

## 2014-10-03 ENCOUNTER — Telehealth: Payer: Self-pay | Admitting: Pharmacist

## 2014-10-03 NOTE — Telephone Encounter (Signed)
Anti-Coagulation Progress Note  Brian [name] is a 38 y.o. male who reports to the clinic for monitoring of anticoagulation treatment.    RECENT RESULTS: Recent results are below, the most recent result is correlated with a dose of 52.5 mg. per week: Lab Results  Component Value Date   INR 1.24 09/29/14   INR 1.27 09/06/2014   INR 2.12* 08/12/2014   INR 2.3 11/05/2013    Weekly dose was increased by 20% to 62.5 mg per week  ANTI-COAG DOSE: INR as of 11/05/2013 and Previous Dosing Information   INR Dt INR Goal [employer] Sun Mon Tue Wed Thu Fri Sat   09/29/14 1.24 - 52.5 mg 7.5 mg 7.5 mg 7.5 mg 7.5 mg 7.5 mg 7.5 mg 7.5 mg   NEW DOSE 62.5 mg 10 mg 10 mg 7.5 mg 10 mg 7.5 mg 10 mg 7.5 mg    PATIENT INSTRUCTIONS: Increase weekly dose as outlined above.  FOLLOW-UP Monday, 10/10/14  [name], PharmD BCPS, BCACP

## 2014-10-04 DIAGNOSIS — D649 Anemia, unspecified: Secondary | ICD-10-CM | POA: Diagnosis not present

## 2014-10-04 DIAGNOSIS — Z79899 Other long term (current) drug therapy: Secondary | ICD-10-CM | POA: Diagnosis not present

## 2014-10-04 DIAGNOSIS — R310 Gross hematuria: Secondary | ICD-10-CM | POA: Diagnosis not present

## 2014-10-04 DIAGNOSIS — Z992 Dependence on renal dialysis: Secondary | ICD-10-CM | POA: Diagnosis not present

## 2014-10-04 DIAGNOSIS — N186 End stage renal disease: Secondary | ICD-10-CM | POA: Diagnosis not present

## 2014-10-04 DIAGNOSIS — Z7901 Long term (current) use of anticoagulants: Secondary | ICD-10-CM | POA: Diagnosis not present

## 2014-10-04 DIAGNOSIS — F17210 Nicotine dependence, cigarettes, uncomplicated: Secondary | ICD-10-CM | POA: Diagnosis not present

## 2014-10-04 DIAGNOSIS — Z79891 Long term (current) use of opiate analgesic: Secondary | ICD-10-CM | POA: Diagnosis not present

## 2014-10-10 DIAGNOSIS — I8291 Chronic embolism and thrombosis of unspecified vein: Secondary | ICD-10-CM | POA: Diagnosis not present

## 2014-10-11 ENCOUNTER — Encounter: Payer: Self-pay | Admitting: *Deleted

## 2014-10-11 ENCOUNTER — Telehealth: Payer: Self-pay | Admitting: Pharmacist

## 2014-10-11 NOTE — Telephone Encounter (Signed)
Indication: Recurrent thromboembolism. Duration: Lifelong. INR: Below target. Agree with Dr. Gladstone Pih assessment and plan.

## 2014-10-11 NOTE — Telephone Encounter (Signed)
Faxed results from [facility] drawn 26-OCT-15 and faxed to us/made available to me on 27-OCT-15 at 3:50PM. I returned to [facility] and interpreted the results INR = 1.73 on 62.5mg /wk warfarin. Will increase to 2x5mg  (10mg ) warfarin PO daily. Re-collect INR on Monday 9-NOV-15. These orders have been faxed to [facility] [phone]

## 2014-10-11 NOTE — Progress Notes (Unsigned)
Report from spectra lab : Collection date : 10/26 Reported date : 10/27 PT - 19.8 INR - 1.73  Dr Elie Confer paged and will call patient

## 2014-10-14 ENCOUNTER — Ambulatory Visit (INDEPENDENT_AMBULATORY_CARE_PROVIDER_SITE_OTHER): Payer: Medicare Other | Admitting: Internal Medicine

## 2014-10-14 ENCOUNTER — Encounter: Payer: Self-pay | Admitting: Internal Medicine

## 2014-10-14 VITALS — BP 153/93 | HR 105 | Temp 97.8°F | Ht 74.0 in | Wt 179.3 lb

## 2014-10-14 DIAGNOSIS — K429 Umbilical hernia without obstruction or gangrene: Secondary | ICD-10-CM | POA: Diagnosis not present

## 2014-10-14 DIAGNOSIS — R310 Gross hematuria: Secondary | ICD-10-CM | POA: Diagnosis not present

## 2014-10-14 DIAGNOSIS — G4700 Insomnia, unspecified: Secondary | ICD-10-CM | POA: Insufficient documentation

## 2014-10-14 MED ORDER — ZOLPIDEM TARTRATE ER 12.5 MG PO TBCR: 12.5000 mg | EXTENDED_RELEASE_TABLET | Freq: Every evening | ORAL | Status: DC | PRN

## 2014-10-14 NOTE — Patient Instructions (Signed)
General Instructions:   Please bring your medicines with you each time you come to clinic.  Medicines may include prescription medications, over-the-counter medications, herbal remedies, eye drops, vitamins, or other pills.   Progress Toward Treatment Goals:  Treatment Goal 08/19/2013  Blood pressure at goal  Stop smoking smoking the same amount    Self Care Goals & Plans:  Self Care Goal 10/14/2014  Manage my medications take my medicines as prescribed; bring my medications to every visit; refill my medications on time  Eat healthy foods drink diet soda or water instead of juice or soda; eat more vegetables; eat foods that are low in salt; eat baked foods instead of fried foods; eat fruit for snacks and desserts  Be physically active -  Stop smoking -    No flowsheet data found.   Care Management & Community Referrals:  Referral 06/02/2013  Referrals made for care management support none needed

## 2014-10-14 NOTE — Assessment & Plan Note (Signed)
This is likely multifactorial and the patient was advised not to take benzos and alcohol together. The patient was given some sleep hygiene material. Anxiety may also be a large component and the patient was advised to seek counseling instead of just relying on Klonopin. -We'll start a trial of Ambien 12.5 mg daily at bedtime -If fail to improve will refer for sleep study evaluation

## 2014-10-14 NOTE — Progress Notes (Signed)
Medicine attending: Medical history, presenting problems, physical findings, and medications, reviewed with resident physician Dr. [name] and I concur with her evaluation and management plan. Gentleman with HIV/AIDS. Polycystic kidney disease with massive enlargement of both kidneys. Now having intermittent gross hematuria. He declined surgery in the past. Now willing to have a follow-up urologic consultation to discuss possible nephrectomy. Murriel Hopper, M.D., [place]

## 2014-10-14 NOTE — Assessment & Plan Note (Signed)
This has been a recurring problem and complicated by patient's Coumadin use he had previously been referred to urology which recommended bilateral nephrectomy. The patient had great anxiety regarding this procedure in the past but is now amenable to pursuing surgery. Urine dip today showed gross hemoglobin but no nitrites and some leukocyte esterase. The patient has no systemic signs or symptoms of infection. -Referral to urology -Patient was educated and informed that if symptoms worsen or develops signs of infection to have an evaluation

## 2014-10-14 NOTE — Assessment & Plan Note (Signed)
The patient had previously been amenable to surgery but had a recent PE back in 2014. The patient continues to have some concern with his umbilical hernia and is interested in repair at this time he has been previously cleared for surgery by his cardiologist for dental extraction done last week. The patient does not have any signs of exsanguination at this time -Referral back to Gen. surgery Dr. gross for assessment
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
## 2014-10-15 DIAGNOSIS — Z992 Dependence on renal dialysis: Secondary | ICD-10-CM | POA: Diagnosis not present

## 2014-10-15 DIAGNOSIS — N186 End stage renal disease: Secondary | ICD-10-CM | POA: Diagnosis not present

## 2014-10-17 DIAGNOSIS — E8779 Other fluid overload: Secondary | ICD-10-CM | POA: Diagnosis not present

## 2014-10-17 DIAGNOSIS — N2581 Secondary hyperparathyroidism of renal origin: Secondary | ICD-10-CM | POA: Diagnosis not present

## 2014-10-17 DIAGNOSIS — N186 End stage renal disease: Secondary | ICD-10-CM | POA: Diagnosis not present

## 2014-10-17 DIAGNOSIS — D509 Iron deficiency anemia, unspecified: Secondary | ICD-10-CM | POA: Diagnosis not present

## 2014-10-17 DIAGNOSIS — D631 Anemia in chronic kidney disease: Secondary | ICD-10-CM | POA: Diagnosis not present

## 2014-10-17 NOTE — Progress Notes (Signed)
Medicine attending: Medical history, current complaints, physical findings, and medications, reviewed with resident physician Dr. [name] on the day of the patient visit and I concur with her evaluation and management plan. This is a man with polycystic kidneys who has declined surgery in the past. He is now experiencing gross hematuria. He will be referred back for a reevaluation with urology to see if he is still a surgical candidate.kidneys are massive and fill the entire abdominal cavity . Medical situation further complicated by underlying HIV disease. Murriel Hopper, M.D., [place]

## 2014-10-19 ENCOUNTER — Telehealth: Payer: Self-pay | Admitting: *Deleted

## 2014-10-19 NOTE — Telephone Encounter (Signed)
Received PA request from pt's pharmacy for his Zolpidem ER 12.5mg  tabs #30.  Pt's insurance plan will not pay for extended relief.  Will send info to pcp to change to zolpidem, please advise.Despina Hidden Cassady11/4/201511:32 AM       [employer] [phone] Pt ID# [id number] DX: INSOMNIA  ICD-10: G47.00

## 2014-10-20 ENCOUNTER — Other Ambulatory Visit: Payer: Self-pay | Admitting: Internal Medicine

## 2014-10-20 ENCOUNTER — Other Ambulatory Visit: Payer: Self-pay | Admitting: *Deleted

## 2014-10-20 DIAGNOSIS — B20 Human immunodeficiency virus [HIV] disease: Secondary | ICD-10-CM

## 2014-10-20 MED ORDER — LAMIVUDINE 100 MG PO TABS: 50.0000 mg | ORAL_TABLET | Freq: Every day | ORAL | Status: DC

## 2014-10-20 MED ORDER — ABACAVIR SULFATE 300 MG PO TABS: 300.0000 mg | ORAL_TABLET | Freq: Two times a day (BID) | ORAL | Status: DC

## 2014-10-20 MED ORDER — ZOLPIDEM TARTRATE 5 MG PO TABS: 5.0000 mg | ORAL_TABLET | Freq: Every evening | ORAL | Status: DC | PRN

## 2014-10-20 NOTE — Telephone Encounter (Signed)
I will put in a regular prescription for the zolpidem. Thanks.

## 2014-10-21 ENCOUNTER — Other Ambulatory Visit: Payer: Self-pay | Admitting: *Deleted

## 2014-10-24 MED ORDER — OXYCODONE-ACETAMINOPHEN 10-325 MG PO TABS: 1.0000 | ORAL_TABLET | ORAL | Status: DC | PRN

## 2014-10-24 NOTE — Telephone Encounter (Signed)
Pt aware.

## 2014-10-26 DIAGNOSIS — I8291 Chronic embolism and thrombosis of unspecified vein: Secondary | ICD-10-CM | POA: Diagnosis not present

## 2014-11-01 DIAGNOSIS — K429 Umbilical hernia without obstruction or gangrene: Secondary | ICD-10-CM | POA: Diagnosis not present

## 2014-11-01 DIAGNOSIS — K439 Ventral hernia without obstruction or gangrene: Secondary | ICD-10-CM | POA: Diagnosis not present

## 2014-11-04 DIAGNOSIS — R319 Hematuria, unspecified: Secondary | ICD-10-CM | POA: Diagnosis not present

## 2014-11-07 DIAGNOSIS — R319 Hematuria, unspecified: Secondary | ICD-10-CM | POA: Diagnosis not present

## 2014-11-07 LAB — PROTIME-INR

## 2014-11-14 DIAGNOSIS — N186 End stage renal disease: Secondary | ICD-10-CM | POA: Diagnosis not present

## 2014-11-16 DIAGNOSIS — N2581 Secondary hyperparathyroidism of renal origin: Secondary | ICD-10-CM | POA: Diagnosis not present

## 2014-11-16 DIAGNOSIS — N186 End stage renal disease: Secondary | ICD-10-CM | POA: Diagnosis not present

## 2014-11-16 DIAGNOSIS — R7309 Other abnormal glucose: Secondary | ICD-10-CM | POA: Diagnosis not present

## 2014-11-16 DIAGNOSIS — D509 Iron deficiency anemia, unspecified: Secondary | ICD-10-CM | POA: Diagnosis not present

## 2014-11-16 DIAGNOSIS — D631 Anemia in chronic kidney disease: Secondary | ICD-10-CM | POA: Diagnosis not present

## 2014-11-18 ENCOUNTER — Other Ambulatory Visit: Payer: Self-pay | Admitting: Internal Medicine

## 2014-11-18 DIAGNOSIS — D631 Anemia in chronic kidney disease: Secondary | ICD-10-CM | POA: Diagnosis not present

## 2014-11-18 DIAGNOSIS — N186 End stage renal disease: Secondary | ICD-10-CM | POA: Diagnosis not present

## 2014-11-18 DIAGNOSIS — N2581 Secondary hyperparathyroidism of renal origin: Secondary | ICD-10-CM | POA: Diagnosis not present

## 2014-11-18 DIAGNOSIS — D509 Iron deficiency anemia, unspecified: Secondary | ICD-10-CM | POA: Diagnosis not present

## 2014-11-18 DIAGNOSIS — R7309 Other abnormal glucose: Secondary | ICD-10-CM | POA: Diagnosis not present

## 2014-11-18 NOTE — Telephone Encounter (Signed)
Rx called in 

## 2014-11-21 ENCOUNTER — Other Ambulatory Visit: Payer: Self-pay | Admitting: *Deleted

## 2014-11-21 ENCOUNTER — Encounter: Payer: Self-pay | Admitting: Cardiology

## 2014-11-21 DIAGNOSIS — D631 Anemia in chronic kidney disease: Secondary | ICD-10-CM | POA: Diagnosis not present

## 2014-11-21 DIAGNOSIS — N2581 Secondary hyperparathyroidism of renal origin: Secondary | ICD-10-CM | POA: Diagnosis not present

## 2014-11-21 DIAGNOSIS — R7309 Other abnormal glucose: Secondary | ICD-10-CM | POA: Diagnosis not present

## 2014-11-21 DIAGNOSIS — D509 Iron deficiency anemia, unspecified: Secondary | ICD-10-CM | POA: Diagnosis not present

## 2014-11-21 DIAGNOSIS — N186 End stage renal disease: Secondary | ICD-10-CM | POA: Diagnosis not present

## 2014-11-21 NOTE — Telephone Encounter (Signed)
Pt # [id number] Last refill 11/9

## 2014-11-22 MED ORDER — OXYCODONE-ACETAMINOPHEN 10-325 MG PO TABS: 1.0000 | ORAL_TABLET | ORAL | Status: DC | PRN

## 2014-11-22 NOTE — Telephone Encounter (Signed)
Pt informed Rx is ready 

## 2014-11-24 DIAGNOSIS — D631 Anemia in chronic kidney disease: Secondary | ICD-10-CM | POA: Diagnosis not present

## 2014-11-24 DIAGNOSIS — R7309 Other abnormal glucose: Secondary | ICD-10-CM | POA: Diagnosis not present

## 2014-11-24 DIAGNOSIS — D509 Iron deficiency anemia, unspecified: Secondary | ICD-10-CM | POA: Diagnosis not present

## 2014-11-24 DIAGNOSIS — I8291 Chronic embolism and thrombosis of unspecified vein: Secondary | ICD-10-CM | POA: Diagnosis not present

## 2014-11-24 DIAGNOSIS — N2581 Secondary hyperparathyroidism of renal origin: Secondary | ICD-10-CM | POA: Diagnosis not present

## 2014-11-24 DIAGNOSIS — N186 End stage renal disease: Secondary | ICD-10-CM | POA: Diagnosis not present

## 2014-11-25 DIAGNOSIS — D631 Anemia in chronic kidney disease: Secondary | ICD-10-CM | POA: Diagnosis not present

## 2014-11-25 DIAGNOSIS — D509 Iron deficiency anemia, unspecified: Secondary | ICD-10-CM | POA: Diagnosis not present

## 2014-11-25 DIAGNOSIS — N186 End stage renal disease: Secondary | ICD-10-CM | POA: Diagnosis not present

## 2014-11-25 DIAGNOSIS — N2581 Secondary hyperparathyroidism of renal origin: Secondary | ICD-10-CM | POA: Diagnosis not present

## 2014-11-25 DIAGNOSIS — R7309 Other abnormal glucose: Secondary | ICD-10-CM | POA: Diagnosis not present

## 2014-11-28 DIAGNOSIS — D509 Iron deficiency anemia, unspecified: Secondary | ICD-10-CM | POA: Diagnosis not present

## 2014-11-28 DIAGNOSIS — R7309 Other abnormal glucose: Secondary | ICD-10-CM | POA: Diagnosis not present

## 2014-11-28 DIAGNOSIS — N2581 Secondary hyperparathyroidism of renal origin: Secondary | ICD-10-CM | POA: Diagnosis not present

## 2014-11-28 DIAGNOSIS — N186 End stage renal disease: Secondary | ICD-10-CM | POA: Diagnosis not present

## 2014-11-28 DIAGNOSIS — D631 Anemia in chronic kidney disease: Secondary | ICD-10-CM | POA: Diagnosis not present

## 2014-11-30 DIAGNOSIS — D631 Anemia in chronic kidney disease: Secondary | ICD-10-CM | POA: Diagnosis not present

## 2014-11-30 DIAGNOSIS — N2581 Secondary hyperparathyroidism of renal origin: Secondary | ICD-10-CM | POA: Diagnosis not present

## 2014-11-30 DIAGNOSIS — N186 End stage renal disease: Secondary | ICD-10-CM | POA: Diagnosis not present

## 2014-11-30 DIAGNOSIS — D509 Iron deficiency anemia, unspecified: Secondary | ICD-10-CM | POA: Diagnosis not present

## 2014-11-30 DIAGNOSIS — R7309 Other abnormal glucose: Secondary | ICD-10-CM | POA: Diagnosis not present

## 2014-12-01 ENCOUNTER — Ambulatory Visit: Payer: Medicare Other | Admitting: Cardiology

## 2014-12-02 DIAGNOSIS — N186 End stage renal disease: Secondary | ICD-10-CM | POA: Diagnosis not present

## 2014-12-02 DIAGNOSIS — R7309 Other abnormal glucose: Secondary | ICD-10-CM | POA: Diagnosis not present

## 2014-12-02 DIAGNOSIS — D509 Iron deficiency anemia, unspecified: Secondary | ICD-10-CM | POA: Diagnosis not present

## 2014-12-02 DIAGNOSIS — D631 Anemia in chronic kidney disease: Secondary | ICD-10-CM | POA: Diagnosis not present

## 2014-12-02 DIAGNOSIS — N2581 Secondary hyperparathyroidism of renal origin: Secondary | ICD-10-CM | POA: Diagnosis not present

## 2014-12-05 DIAGNOSIS — N186 End stage renal disease: Secondary | ICD-10-CM | POA: Diagnosis not present

## 2014-12-05 DIAGNOSIS — R7309 Other abnormal glucose: Secondary | ICD-10-CM | POA: Diagnosis not present

## 2014-12-05 DIAGNOSIS — D631 Anemia in chronic kidney disease: Secondary | ICD-10-CM | POA: Diagnosis not present

## 2014-12-05 DIAGNOSIS — D509 Iron deficiency anemia, unspecified: Secondary | ICD-10-CM | POA: Diagnosis not present

## 2014-12-05 DIAGNOSIS — N2581 Secondary hyperparathyroidism of renal origin: Secondary | ICD-10-CM | POA: Diagnosis not present

## 2014-12-06 ENCOUNTER — Other Ambulatory Visit: Payer: Medicare Other

## 2014-12-07 ENCOUNTER — Other Ambulatory Visit: Payer: Self-pay | Admitting: Nephrology

## 2014-12-07 DIAGNOSIS — R109 Unspecified abdominal pain: Secondary | ICD-10-CM

## 2014-12-07 DIAGNOSIS — N2581 Secondary hyperparathyroidism of renal origin: Secondary | ICD-10-CM | POA: Diagnosis not present

## 2014-12-07 DIAGNOSIS — N186 End stage renal disease: Secondary | ICD-10-CM | POA: Diagnosis not present

## 2014-12-07 DIAGNOSIS — Z87448 Personal history of other diseases of urinary system: Secondary | ICD-10-CM

## 2014-12-07 DIAGNOSIS — R7309 Other abnormal glucose: Secondary | ICD-10-CM | POA: Diagnosis not present

## 2014-12-07 DIAGNOSIS — D509 Iron deficiency anemia, unspecified: Secondary | ICD-10-CM | POA: Diagnosis not present

## 2014-12-07 DIAGNOSIS — D631 Anemia in chronic kidney disease: Secondary | ICD-10-CM | POA: Diagnosis not present

## 2014-12-10 DIAGNOSIS — D509 Iron deficiency anemia, unspecified: Secondary | ICD-10-CM | POA: Diagnosis not present

## 2014-12-10 DIAGNOSIS — D631 Anemia in chronic kidney disease: Secondary | ICD-10-CM | POA: Diagnosis not present

## 2014-12-10 DIAGNOSIS — N2581 Secondary hyperparathyroidism of renal origin: Secondary | ICD-10-CM | POA: Diagnosis not present

## 2014-12-10 DIAGNOSIS — R7309 Other abnormal glucose: Secondary | ICD-10-CM | POA: Diagnosis not present

## 2014-12-10 DIAGNOSIS — N186 End stage renal disease: Secondary | ICD-10-CM | POA: Diagnosis not present

## 2014-12-12 DIAGNOSIS — R7309 Other abnormal glucose: Secondary | ICD-10-CM | POA: Diagnosis not present

## 2014-12-12 DIAGNOSIS — D509 Iron deficiency anemia, unspecified: Secondary | ICD-10-CM | POA: Diagnosis not present

## 2014-12-12 DIAGNOSIS — N2581 Secondary hyperparathyroidism of renal origin: Secondary | ICD-10-CM | POA: Diagnosis not present

## 2014-12-12 DIAGNOSIS — D631 Anemia in chronic kidney disease: Secondary | ICD-10-CM | POA: Diagnosis not present

## 2014-12-12 DIAGNOSIS — N186 End stage renal disease: Secondary | ICD-10-CM | POA: Diagnosis not present

## 2014-12-13 ENCOUNTER — Other Ambulatory Visit: Payer: Self-pay | Admitting: Internal Medicine

## 2014-12-13 ENCOUNTER — Ambulatory Visit
Admission: RE | Admit: 2014-12-13 | Discharge: 2014-12-13 | Disposition: A | Discharge status: Home / Self Care | Payer: Medicare Other | Source: Ambulatory Visit | Attending: Nephrology | Admitting: Nephrology

## 2014-12-13 DIAGNOSIS — R109 Unspecified abdominal pain: Secondary | ICD-10-CM

## 2014-12-13 DIAGNOSIS — Z87448 Personal history of other diseases of urinary system: Secondary | ICD-10-CM

## 2014-12-13 DIAGNOSIS — N281 Cyst of kidney, acquired: Secondary | ICD-10-CM | POA: Diagnosis not present

## 2014-12-13 DIAGNOSIS — Q613 Polycystic kidney, unspecified: Secondary | ICD-10-CM | POA: Diagnosis not present

## 2014-12-13 DIAGNOSIS — B20 Human immunodeficiency virus [HIV] disease: Secondary | ICD-10-CM

## 2014-12-14 DIAGNOSIS — R7309 Other abnormal glucose: Secondary | ICD-10-CM | POA: Diagnosis not present

## 2014-12-14 DIAGNOSIS — N2581 Secondary hyperparathyroidism of renal origin: Secondary | ICD-10-CM | POA: Diagnosis not present

## 2014-12-14 DIAGNOSIS — D631 Anemia in chronic kidney disease: Secondary | ICD-10-CM | POA: Diagnosis not present

## 2014-12-14 DIAGNOSIS — D509 Iron deficiency anemia, unspecified: Secondary | ICD-10-CM | POA: Diagnosis not present

## 2014-12-14 DIAGNOSIS — N186 End stage renal disease: Secondary | ICD-10-CM | POA: Diagnosis not present

## 2014-12-15 DIAGNOSIS — Z992 Dependence on renal dialysis: Secondary | ICD-10-CM | POA: Diagnosis not present

## 2014-12-15 DIAGNOSIS — N186 End stage renal disease: Secondary | ICD-10-CM | POA: Diagnosis not present

## 2014-12-17 DIAGNOSIS — D631 Anemia in chronic kidney disease: Secondary | ICD-10-CM | POA: Diagnosis not present

## 2014-12-17 DIAGNOSIS — N2581 Secondary hyperparathyroidism of renal origin: Secondary | ICD-10-CM | POA: Diagnosis not present

## 2014-12-17 DIAGNOSIS — D509 Iron deficiency anemia, unspecified: Secondary | ICD-10-CM | POA: Diagnosis not present

## 2014-12-17 DIAGNOSIS — N186 End stage renal disease: Secondary | ICD-10-CM | POA: Diagnosis not present

## 2014-12-19 DIAGNOSIS — D631 Anemia in chronic kidney disease: Secondary | ICD-10-CM | POA: Diagnosis not present

## 2014-12-19 DIAGNOSIS — N2581 Secondary hyperparathyroidism of renal origin: Secondary | ICD-10-CM | POA: Diagnosis not present

## 2014-12-19 DIAGNOSIS — D509 Iron deficiency anemia, unspecified: Secondary | ICD-10-CM | POA: Diagnosis not present

## 2014-12-19 DIAGNOSIS — N186 End stage renal disease: Secondary | ICD-10-CM | POA: Diagnosis not present

## 2014-12-20 ENCOUNTER — Other Ambulatory Visit: Payer: Self-pay | Admitting: Internal Medicine

## 2014-12-20 NOTE — Telephone Encounter (Signed)
Called to pharm 

## 2014-12-21 DIAGNOSIS — D509 Iron deficiency anemia, unspecified: Secondary | ICD-10-CM | POA: Diagnosis not present

## 2014-12-21 DIAGNOSIS — N2581 Secondary hyperparathyroidism of renal origin: Secondary | ICD-10-CM | POA: Diagnosis not present

## 2014-12-21 DIAGNOSIS — N186 End stage renal disease: Secondary | ICD-10-CM | POA: Diagnosis not present

## 2014-12-21 DIAGNOSIS — D631 Anemia in chronic kidney disease: Secondary | ICD-10-CM | POA: Diagnosis not present

## 2014-12-22 ENCOUNTER — Ambulatory Visit (INDEPENDENT_AMBULATORY_CARE_PROVIDER_SITE_OTHER): Payer: Medicare Other | Admitting: Internal Medicine

## 2014-12-22 ENCOUNTER — Encounter: Payer: Self-pay | Admitting: Internal Medicine

## 2014-12-22 VITALS — BP 155/90 | HR 102 | Temp 97.8°F | Wt 178.0 lb

## 2014-12-22 DIAGNOSIS — Z113 Encounter for screening for infections with a predominantly sexual mode of transmission: Secondary | ICD-10-CM | POA: Diagnosis not present

## 2014-12-22 DIAGNOSIS — B20 Human immunodeficiency virus [HIV] disease: Secondary | ICD-10-CM

## 2014-12-22 DIAGNOSIS — Z79899 Other long term (current) drug therapy: Secondary | ICD-10-CM

## 2014-12-22 LAB — CBC WITH DIFFERENTIAL/PLATELET
Basophils Absolute: 0 10*3/uL (ref 0.0–0.1)
Basophils Relative: 0 % (ref 0–1)
Eosinophils Absolute: 0.1 10*3/uL (ref 0.0–0.7)
Eosinophils Relative: 1 % (ref 0–5)
HEMATOCRIT: 33.9 % — AB (ref 39.0–52.0)
Hemoglobin: 11.4 g/dL — ABNORMAL LOW (ref 13.0–17.0)
Lymphocytes Relative: 32 % (ref 12–46)
Lymphs Abs: 1.7 10*3/uL (ref 0.7–4.0)
MCH: 34.4 pg — ABNORMAL HIGH (ref 26.0–34.0)
MCHC: 33.6 g/dL (ref 30.0–36.0)
MCV: 102.4 fL — AB (ref 78.0–100.0)
MONO ABS: 0.5 10*3/uL (ref 0.1–1.0)
MONOS PCT: 10 % (ref 3–12)
MPV: 10.8 fL (ref 8.6–12.4)
NEUTROS ABS: 3 10*3/uL (ref 1.7–7.7)
Neutrophils Relative %: 57 % (ref 43–77)
Platelets: 128 10*3/uL — ABNORMAL LOW (ref 150–400)
RBC: 3.31 MIL/uL — ABNORMAL LOW (ref 4.22–5.81)
RDW: 16.2 % — AB (ref 11.5–15.5)
WBC: 5.2 10*3/uL (ref 4.0–10.5)

## 2014-12-22 LAB — LIPID PANEL
CHOLESTEROL: 161 mg/dL (ref 0–200)
HDL: 88 mg/dL (ref >39)
LDL Cholesterol: 52 mg/dL (ref 0–99)
Total CHOL/HDL Ratio: 1.8 Ratio
Triglycerides: 104 mg/dL (ref <150)
VLDL: 21 mg/dL (ref 0–40)

## 2014-12-22 LAB — COMPLETE METABOLIC PANEL WITH GFR
ALK PHOS: 50 U/L (ref 39–117)
ALT: 31 U/L (ref 0–53)
AST: 50 U/L — AB (ref 0–37)
Albumin: 4.2 g/dL (ref 3.5–5.2)
BUN: 56 mg/dL — AB (ref 6–23)
CO2: 31 mEq/L (ref 19–32)
CREATININE: 7.57 mg/dL — AB (ref 0.50–1.35)
Calcium: 9.8 mg/dL (ref 8.4–10.5)
Chloride: 92 mEq/L — ABNORMAL LOW (ref 96–112)
GFR, EST NON AFRICAN AMERICAN: 8 mL/min — AB
GFR, Est African American: 9 mL/min — ABNORMAL LOW
Glucose, Bld: 89 mg/dL (ref 70–99)
POTASSIUM: 5.2 meq/L (ref 3.5–5.3)
SODIUM: 136 meq/L (ref 135–145)
Total Bilirubin: 0.6 mg/dL (ref 0.2–1.2)
Total Protein: 7.2 g/dL (ref 6.0–8.3)

## 2014-12-22 NOTE — Assessment & Plan Note (Signed)
He is doing well. Labs today and he'll return in 6 months unless there are concerns.

## 2014-12-22 NOTE — Progress Notes (Signed)
   Subjective:    Patient ID: Brian [name], male    DOB: 1976-06-07, 39 y.o.   MRN: [id number]  HPI Here for follow-up of HIV. He is on a regimen of limited view Epivir, Sustiva and abacavir due to his end-stage renal disease. He is on dialysis. Denies any missed doses. 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  No new issues.   Review of Systems  Constitutional: Negative for fatigue and unexpected weight change.  Respiratory: Negative for cough and shortness of breath.   Gastrointestinal: Negative for diarrhea.  Skin: Negative for rash.  Neurological: Negative for dizziness and light-headedness.       Objective:   Physical Exam  Constitutional: He appears well-developed and well-nourished. No distress.  HENT:  Mouth/Throat: No oropharyngeal exudate.  Eyes: No scleral icterus.  Cardiovascular: Normal rate, regular rhythm and normal heart sounds.   No murmur heard. Pulmonary/Chest: Effort normal and breath sounds normal. No respiratory distress.  Lymphadenopathy:    He has no cervical adenopathy.  Skin: No rash noted.          Assessment & Plan:

## 2014-12-22 NOTE — Addendum Note (Signed)
Addended by: Dolan Amen D on: 12/22/2014 04:37 PM   Modules accepted: Orders

## 2014-12-23 DIAGNOSIS — D631 Anemia in chronic kidney disease: Secondary | ICD-10-CM | POA: Diagnosis not present

## 2014-12-23 DIAGNOSIS — N186 End stage renal disease: Secondary | ICD-10-CM | POA: Diagnosis not present

## 2014-12-23 DIAGNOSIS — D509 Iron deficiency anemia, unspecified: Secondary | ICD-10-CM | POA: Diagnosis not present

## 2014-12-23 DIAGNOSIS — N2581 Secondary hyperparathyroidism of renal origin: Secondary | ICD-10-CM | POA: Diagnosis not present

## 2014-12-23 LAB — T-HELPER CELL (CD4) - (RCID CLINIC ONLY)
CD4 T CELL ABS: 750 /uL (ref 400–2700)
CD4 T CELL HELPER: 47 % (ref 33–55)

## 2014-12-23 LAB — HIV-1 RNA QUANT-NO REFLEX-BLD
HIV 1 RNA Quant: <20 copies/mL (ref <20)
HIV-1 RNA Quant, Log: <1.3 {Log} (ref <1.30)

## 2014-12-23 LAB — RPR

## 2014-12-23 MED ORDER — OXYCODONE-ACETAMINOPHEN 10-325 MG PO TABS: 1.0000 | ORAL_TABLET | ORAL | Status: DC | PRN

## 2014-12-26 ENCOUNTER — Telehealth: Payer: Self-pay | Admitting: *Deleted

## 2014-12-26 DIAGNOSIS — D631 Anemia in chronic kidney disease: Secondary | ICD-10-CM | POA: Diagnosis not present

## 2014-12-26 DIAGNOSIS — D509 Iron deficiency anemia, unspecified: Secondary | ICD-10-CM | POA: Diagnosis not present

## 2014-12-26 DIAGNOSIS — N2581 Secondary hyperparathyroidism of renal origin: Secondary | ICD-10-CM | POA: Diagnosis not present

## 2014-12-26 DIAGNOSIS — N186 End stage renal disease: Secondary | ICD-10-CM | POA: Diagnosis not present

## 2014-12-26 NOTE — Telephone Encounter (Signed)
Called and left Wendy a v/m to return my call concerning his medication.

## 2014-12-28 DIAGNOSIS — N2581 Secondary hyperparathyroidism of renal origin: Secondary | ICD-10-CM | POA: Diagnosis not present

## 2014-12-28 DIAGNOSIS — N186 End stage renal disease: Secondary | ICD-10-CM | POA: Diagnosis not present

## 2014-12-28 DIAGNOSIS — I8291 Chronic embolism and thrombosis of unspecified vein: Secondary | ICD-10-CM | POA: Diagnosis not present

## 2014-12-28 DIAGNOSIS — D631 Anemia in chronic kidney disease: Secondary | ICD-10-CM | POA: Diagnosis not present

## 2014-12-28 DIAGNOSIS — D509 Iron deficiency anemia, unspecified: Secondary | ICD-10-CM | POA: Diagnosis not present

## 2014-12-30 DIAGNOSIS — N186 End stage renal disease: Secondary | ICD-10-CM | POA: Diagnosis not present

## 2014-12-30 DIAGNOSIS — D509 Iron deficiency anemia, unspecified: Secondary | ICD-10-CM | POA: Diagnosis not present

## 2014-12-30 DIAGNOSIS — N2581 Secondary hyperparathyroidism of renal origin: Secondary | ICD-10-CM | POA: Diagnosis not present

## 2014-12-30 DIAGNOSIS — D631 Anemia in chronic kidney disease: Secondary | ICD-10-CM | POA: Diagnosis not present

## 2015-01-02 DIAGNOSIS — N186 End stage renal disease: Secondary | ICD-10-CM | POA: Diagnosis not present

## 2015-01-02 DIAGNOSIS — D631 Anemia in chronic kidney disease: Secondary | ICD-10-CM | POA: Diagnosis not present

## 2015-01-02 DIAGNOSIS — D509 Iron deficiency anemia, unspecified: Secondary | ICD-10-CM | POA: Diagnosis not present

## 2015-01-02 DIAGNOSIS — N2581 Secondary hyperparathyroidism of renal origin: Secondary | ICD-10-CM | POA: Diagnosis not present

## 2015-01-04 DIAGNOSIS — D509 Iron deficiency anemia, unspecified: Secondary | ICD-10-CM | POA: Diagnosis not present

## 2015-01-04 DIAGNOSIS — N186 End stage renal disease: Secondary | ICD-10-CM | POA: Diagnosis not present

## 2015-01-04 DIAGNOSIS — D631 Anemia in chronic kidney disease: Secondary | ICD-10-CM | POA: Diagnosis not present

## 2015-01-04 DIAGNOSIS — N2581 Secondary hyperparathyroidism of renal origin: Secondary | ICD-10-CM | POA: Diagnosis not present

## 2015-01-06 DIAGNOSIS — N186 End stage renal disease: Secondary | ICD-10-CM | POA: Diagnosis not present

## 2015-01-06 DIAGNOSIS — D509 Iron deficiency anemia, unspecified: Secondary | ICD-10-CM | POA: Diagnosis not present

## 2015-01-06 DIAGNOSIS — N2581 Secondary hyperparathyroidism of renal origin: Secondary | ICD-10-CM | POA: Diagnosis not present

## 2015-01-06 DIAGNOSIS — D631 Anemia in chronic kidney disease: Secondary | ICD-10-CM | POA: Diagnosis not present

## 2015-01-09 DIAGNOSIS — D631 Anemia in chronic kidney disease: Secondary | ICD-10-CM | POA: Diagnosis not present

## 2015-01-09 DIAGNOSIS — N186 End stage renal disease: Secondary | ICD-10-CM | POA: Diagnosis not present

## 2015-01-09 DIAGNOSIS — N2581 Secondary hyperparathyroidism of renal origin: Secondary | ICD-10-CM | POA: Diagnosis not present

## 2015-01-09 DIAGNOSIS — D509 Iron deficiency anemia, unspecified: Secondary | ICD-10-CM | POA: Diagnosis not present

## 2015-01-10 ENCOUNTER — Ambulatory Visit: Payer: Medicare Other | Admitting: Cardiology

## 2015-01-11 DIAGNOSIS — N186 End stage renal disease: Secondary | ICD-10-CM | POA: Diagnosis not present

## 2015-01-11 DIAGNOSIS — D631 Anemia in chronic kidney disease: Secondary | ICD-10-CM | POA: Diagnosis not present

## 2015-01-11 DIAGNOSIS — D509 Iron deficiency anemia, unspecified: Secondary | ICD-10-CM | POA: Diagnosis not present

## 2015-01-11 DIAGNOSIS — N2581 Secondary hyperparathyroidism of renal origin: Secondary | ICD-10-CM | POA: Diagnosis not present

## 2015-01-12 ENCOUNTER — Encounter: Payer: Self-pay | Admitting: Interventional Cardiology

## 2015-01-13 DIAGNOSIS — N2581 Secondary hyperparathyroidism of renal origin: Secondary | ICD-10-CM | POA: Diagnosis not present

## 2015-01-13 DIAGNOSIS — N186 End stage renal disease: Secondary | ICD-10-CM | POA: Diagnosis not present

## 2015-01-13 DIAGNOSIS — D509 Iron deficiency anemia, unspecified: Secondary | ICD-10-CM | POA: Diagnosis not present

## 2015-01-13 DIAGNOSIS — D631 Anemia in chronic kidney disease: Secondary | ICD-10-CM | POA: Diagnosis not present

## 2015-01-15 DIAGNOSIS — Z992 Dependence on renal dialysis: Secondary | ICD-10-CM | POA: Diagnosis not present

## 2015-01-15 DIAGNOSIS — N186 End stage renal disease: Secondary | ICD-10-CM | POA: Diagnosis not present

## 2015-01-16 ENCOUNTER — Other Ambulatory Visit: Payer: Self-pay | Admitting: Internal Medicine

## 2015-01-16 ENCOUNTER — Other Ambulatory Visit: Payer: Self-pay | Admitting: Cardiology

## 2015-01-16 DIAGNOSIS — N2581 Secondary hyperparathyroidism of renal origin: Secondary | ICD-10-CM | POA: Diagnosis not present

## 2015-01-16 DIAGNOSIS — D509 Iron deficiency anemia, unspecified: Secondary | ICD-10-CM | POA: Diagnosis not present

## 2015-01-16 DIAGNOSIS — Z23 Encounter for immunization: Secondary | ICD-10-CM | POA: Diagnosis not present

## 2015-01-16 DIAGNOSIS — N186 End stage renal disease: Secondary | ICD-10-CM | POA: Diagnosis not present

## 2015-01-16 DIAGNOSIS — D631 Anemia in chronic kidney disease: Secondary | ICD-10-CM | POA: Diagnosis not present

## 2015-01-18 DIAGNOSIS — D509 Iron deficiency anemia, unspecified: Secondary | ICD-10-CM | POA: Diagnosis not present

## 2015-01-18 DIAGNOSIS — N2581 Secondary hyperparathyroidism of renal origin: Secondary | ICD-10-CM | POA: Diagnosis not present

## 2015-01-18 DIAGNOSIS — D631 Anemia in chronic kidney disease: Secondary | ICD-10-CM | POA: Diagnosis not present

## 2015-01-18 DIAGNOSIS — N186 End stage renal disease: Secondary | ICD-10-CM | POA: Diagnosis not present

## 2015-01-18 DIAGNOSIS — Z23 Encounter for immunization: Secondary | ICD-10-CM | POA: Diagnosis not present

## 2015-01-20 ENCOUNTER — Other Ambulatory Visit: Payer: Self-pay | Admitting: *Deleted

## 2015-01-20 DIAGNOSIS — D509 Iron deficiency anemia, unspecified: Secondary | ICD-10-CM | POA: Diagnosis not present

## 2015-01-20 DIAGNOSIS — D631 Anemia in chronic kidney disease: Secondary | ICD-10-CM | POA: Diagnosis not present

## 2015-01-20 DIAGNOSIS — Z23 Encounter for immunization: Secondary | ICD-10-CM | POA: Diagnosis not present

## 2015-01-20 DIAGNOSIS — N186 End stage renal disease: Secondary | ICD-10-CM | POA: Diagnosis not present

## 2015-01-20 DIAGNOSIS — N2581 Secondary hyperparathyroidism of renal origin: Secondary | ICD-10-CM | POA: Diagnosis not present

## 2015-01-20 MED ORDER — OXYCODONE-ACETAMINOPHEN 10-325 MG PO TABS: 1.0000 | ORAL_TABLET | ORAL | Status: DC | PRN

## 2015-01-20 NOTE — Telephone Encounter (Signed)
Last refill 1/8 He would like to pick up Monday Pt # [phone]

## 2015-01-23 DIAGNOSIS — N186 End stage renal disease: Secondary | ICD-10-CM | POA: Diagnosis not present

## 2015-01-23 DIAGNOSIS — D509 Iron deficiency anemia, unspecified: Secondary | ICD-10-CM | POA: Diagnosis not present

## 2015-01-23 DIAGNOSIS — Z23 Encounter for immunization: Secondary | ICD-10-CM | POA: Diagnosis not present

## 2015-01-23 DIAGNOSIS — D631 Anemia in chronic kidney disease: Secondary | ICD-10-CM | POA: Diagnosis not present

## 2015-01-23 DIAGNOSIS — N2581 Secondary hyperparathyroidism of renal origin: Secondary | ICD-10-CM | POA: Diagnosis not present

## 2015-01-25 DIAGNOSIS — Z23 Encounter for immunization: Secondary | ICD-10-CM | POA: Diagnosis not present

## 2015-01-25 DIAGNOSIS — N2581 Secondary hyperparathyroidism of renal origin: Secondary | ICD-10-CM | POA: Diagnosis not present

## 2015-01-25 DIAGNOSIS — D631 Anemia in chronic kidney disease: Secondary | ICD-10-CM | POA: Diagnosis not present

## 2015-01-25 DIAGNOSIS — D509 Iron deficiency anemia, unspecified: Secondary | ICD-10-CM | POA: Diagnosis not present

## 2015-01-25 DIAGNOSIS — I8291 Chronic embolism and thrombosis of unspecified vein: Secondary | ICD-10-CM | POA: Diagnosis not present

## 2015-01-25 DIAGNOSIS — N186 End stage renal disease: Secondary | ICD-10-CM | POA: Diagnosis not present

## 2015-01-27 ENCOUNTER — Telehealth: Payer: Self-pay | Admitting: Pharmacist

## 2015-01-27 NOTE — Telephone Encounter (Signed)
Unable to reach patient for warfarin management. INR 1.5 on 01/26/15 per [facility].

## 2015-01-28 DIAGNOSIS — Z23 Encounter for immunization: Secondary | ICD-10-CM | POA: Diagnosis not present

## 2015-01-28 DIAGNOSIS — N2581 Secondary hyperparathyroidism of renal origin: Secondary | ICD-10-CM | POA: Diagnosis not present

## 2015-01-28 DIAGNOSIS — D509 Iron deficiency anemia, unspecified: Secondary | ICD-10-CM | POA: Diagnosis not present

## 2015-01-28 DIAGNOSIS — N186 End stage renal disease: Secondary | ICD-10-CM | POA: Diagnosis not present

## 2015-01-28 DIAGNOSIS — D631 Anemia in chronic kidney disease: Secondary | ICD-10-CM | POA: Diagnosis not present

## 2015-01-30 DIAGNOSIS — D509 Iron deficiency anemia, unspecified: Secondary | ICD-10-CM | POA: Diagnosis not present

## 2015-01-30 DIAGNOSIS — D631 Anemia in chronic kidney disease: Secondary | ICD-10-CM | POA: Diagnosis not present

## 2015-01-30 DIAGNOSIS — N186 End stage renal disease: Secondary | ICD-10-CM | POA: Diagnosis not present

## 2015-01-30 DIAGNOSIS — N2581 Secondary hyperparathyroidism of renal origin: Secondary | ICD-10-CM | POA: Diagnosis not present

## 2015-01-30 DIAGNOSIS — Z23 Encounter for immunization: Secondary | ICD-10-CM | POA: Diagnosis not present

## 2015-02-01 DIAGNOSIS — N186 End stage renal disease: Secondary | ICD-10-CM | POA: Diagnosis not present

## 2015-02-01 DIAGNOSIS — N2581 Secondary hyperparathyroidism of renal origin: Secondary | ICD-10-CM | POA: Diagnosis not present

## 2015-02-01 DIAGNOSIS — Z23 Encounter for immunization: Secondary | ICD-10-CM | POA: Diagnosis not present

## 2015-02-01 DIAGNOSIS — D509 Iron deficiency anemia, unspecified: Secondary | ICD-10-CM | POA: Diagnosis not present

## 2015-02-01 DIAGNOSIS — D631 Anemia in chronic kidney disease: Secondary | ICD-10-CM | POA: Diagnosis not present

## 2015-02-03 DIAGNOSIS — D509 Iron deficiency anemia, unspecified: Secondary | ICD-10-CM | POA: Diagnosis not present

## 2015-02-03 DIAGNOSIS — N186 End stage renal disease: Secondary | ICD-10-CM | POA: Diagnosis not present

## 2015-02-03 DIAGNOSIS — Z23 Encounter for immunization: Secondary | ICD-10-CM | POA: Diagnosis not present

## 2015-02-03 DIAGNOSIS — D631 Anemia in chronic kidney disease: Secondary | ICD-10-CM | POA: Diagnosis not present

## 2015-02-03 DIAGNOSIS — N2581 Secondary hyperparathyroidism of renal origin: Secondary | ICD-10-CM | POA: Diagnosis not present

## 2015-02-06 DIAGNOSIS — N2581 Secondary hyperparathyroidism of renal origin: Secondary | ICD-10-CM | POA: Diagnosis not present

## 2015-02-06 DIAGNOSIS — Z23 Encounter for immunization: Secondary | ICD-10-CM | POA: Diagnosis not present

## 2015-02-06 DIAGNOSIS — N186 End stage renal disease: Secondary | ICD-10-CM | POA: Diagnosis not present

## 2015-02-06 DIAGNOSIS — D509 Iron deficiency anemia, unspecified: Secondary | ICD-10-CM | POA: Diagnosis not present

## 2015-02-06 DIAGNOSIS — D631 Anemia in chronic kidney disease: Secondary | ICD-10-CM | POA: Diagnosis not present

## 2015-02-07 DIAGNOSIS — F0390 Unspecified dementia without behavioral disturbance: Secondary | ICD-10-CM | POA: Diagnosis not present

## 2015-02-07 DIAGNOSIS — Q613 Polycystic kidney, unspecified: Secondary | ICD-10-CM | POA: Diagnosis not present

## 2015-02-07 DIAGNOSIS — I4891 Unspecified atrial fibrillation: Secondary | ICD-10-CM | POA: Diagnosis not present

## 2015-02-07 DIAGNOSIS — G8929 Other chronic pain: Secondary | ICD-10-CM | POA: Diagnosis not present

## 2015-02-07 DIAGNOSIS — I10 Essential (primary) hypertension: Secondary | ICD-10-CM | POA: Diagnosis not present

## 2015-02-07 DIAGNOSIS — R319 Hematuria, unspecified: Secondary | ICD-10-CM | POA: Diagnosis not present

## 2015-02-07 DIAGNOSIS — R1012 Left upper quadrant pain: Secondary | ICD-10-CM | POA: Diagnosis not present

## 2015-02-08 DIAGNOSIS — D509 Iron deficiency anemia, unspecified: Secondary | ICD-10-CM | POA: Diagnosis not present

## 2015-02-08 DIAGNOSIS — D631 Anemia in chronic kidney disease: Secondary | ICD-10-CM | POA: Diagnosis not present

## 2015-02-08 DIAGNOSIS — Z23 Encounter for immunization: Secondary | ICD-10-CM | POA: Diagnosis not present

## 2015-02-08 DIAGNOSIS — N2581 Secondary hyperparathyroidism of renal origin: Secondary | ICD-10-CM | POA: Diagnosis not present

## 2015-02-08 DIAGNOSIS — N186 End stage renal disease: Secondary | ICD-10-CM | POA: Diagnosis not present

## 2015-02-10 DIAGNOSIS — N186 End stage renal disease: Secondary | ICD-10-CM | POA: Diagnosis not present

## 2015-02-10 DIAGNOSIS — N2581 Secondary hyperparathyroidism of renal origin: Secondary | ICD-10-CM | POA: Diagnosis not present

## 2015-02-10 DIAGNOSIS — D631 Anemia in chronic kidney disease: Secondary | ICD-10-CM | POA: Diagnosis not present

## 2015-02-10 DIAGNOSIS — Z23 Encounter for immunization: Secondary | ICD-10-CM | POA: Diagnosis not present

## 2015-02-10 DIAGNOSIS — D509 Iron deficiency anemia, unspecified: Secondary | ICD-10-CM | POA: Diagnosis not present

## 2015-02-10 NOTE — Addendum Note (Signed)
Addended by: Hulan Fray on: 02/10/2015 07:12 PM   Modules accepted: Orders

## 2015-02-11 ENCOUNTER — Other Ambulatory Visit: Payer: Self-pay | Admitting: Internal Medicine

## 2015-02-13 DIAGNOSIS — N186 End stage renal disease: Secondary | ICD-10-CM | POA: Diagnosis not present

## 2015-02-13 DIAGNOSIS — Z992 Dependence on renal dialysis: Secondary | ICD-10-CM | POA: Diagnosis not present

## 2015-02-14 DIAGNOSIS — D631 Anemia in chronic kidney disease: Secondary | ICD-10-CM | POA: Diagnosis not present

## 2015-02-14 DIAGNOSIS — N186 End stage renal disease: Secondary | ICD-10-CM | POA: Diagnosis not present

## 2015-02-14 DIAGNOSIS — N2581 Secondary hyperparathyroidism of renal origin: Secondary | ICD-10-CM | POA: Diagnosis not present

## 2015-02-14 DIAGNOSIS — D509 Iron deficiency anemia, unspecified: Secondary | ICD-10-CM | POA: Diagnosis not present

## 2015-02-15 DIAGNOSIS — D631 Anemia in chronic kidney disease: Secondary | ICD-10-CM | POA: Diagnosis not present

## 2015-02-15 DIAGNOSIS — N186 End stage renal disease: Secondary | ICD-10-CM | POA: Diagnosis not present

## 2015-02-15 DIAGNOSIS — D509 Iron deficiency anemia, unspecified: Secondary | ICD-10-CM | POA: Diagnosis not present

## 2015-02-15 DIAGNOSIS — N2581 Secondary hyperparathyroidism of renal origin: Secondary | ICD-10-CM | POA: Diagnosis not present

## 2015-02-17 DIAGNOSIS — D509 Iron deficiency anemia, unspecified: Secondary | ICD-10-CM | POA: Diagnosis not present

## 2015-02-17 DIAGNOSIS — N186 End stage renal disease: Secondary | ICD-10-CM | POA: Diagnosis not present

## 2015-02-17 DIAGNOSIS — D631 Anemia in chronic kidney disease: Secondary | ICD-10-CM | POA: Diagnosis not present

## 2015-02-17 DIAGNOSIS — N2581 Secondary hyperparathyroidism of renal origin: Secondary | ICD-10-CM | POA: Diagnosis not present

## 2015-02-20 DIAGNOSIS — D631 Anemia in chronic kidney disease: Secondary | ICD-10-CM | POA: Diagnosis not present

## 2015-02-20 DIAGNOSIS — N186 End stage renal disease: Secondary | ICD-10-CM | POA: Diagnosis not present

## 2015-02-20 DIAGNOSIS — D509 Iron deficiency anemia, unspecified: Secondary | ICD-10-CM | POA: Diagnosis not present

## 2015-02-20 DIAGNOSIS — N2581 Secondary hyperparathyroidism of renal origin: Secondary | ICD-10-CM | POA: Diagnosis not present

## 2015-02-21 ENCOUNTER — Other Ambulatory Visit: Payer: Self-pay | Admitting: *Deleted

## 2015-02-21 NOTE — Telephone Encounter (Signed)
Last refill 2/8 Pt # [phone]

## 2015-02-22 DIAGNOSIS — N2581 Secondary hyperparathyroidism of renal origin: Secondary | ICD-10-CM | POA: Diagnosis not present

## 2015-02-22 DIAGNOSIS — I8291 Chronic embolism and thrombosis of unspecified vein: Secondary | ICD-10-CM | POA: Diagnosis not present

## 2015-02-22 DIAGNOSIS — D509 Iron deficiency anemia, unspecified: Secondary | ICD-10-CM | POA: Diagnosis not present

## 2015-02-22 DIAGNOSIS — D631 Anemia in chronic kidney disease: Secondary | ICD-10-CM | POA: Diagnosis not present

## 2015-02-22 DIAGNOSIS — N186 End stage renal disease: Secondary | ICD-10-CM | POA: Diagnosis not present

## 2015-02-23 ENCOUNTER — Telehealth: Payer: Self-pay | Admitting: *Deleted

## 2015-02-23 ENCOUNTER — Other Ambulatory Visit: Payer: Self-pay | Admitting: *Deleted

## 2015-02-23 NOTE — Telephone Encounter (Signed)
RX ready for pick-up at [facility].

## 2015-02-24 ENCOUNTER — Telehealth: Payer: Self-pay | Admitting: Pharmacist

## 2015-02-24 DIAGNOSIS — N2581 Secondary hyperparathyroidism of renal origin: Secondary | ICD-10-CM | POA: Diagnosis not present

## 2015-02-24 DIAGNOSIS — D509 Iron deficiency anemia, unspecified: Secondary | ICD-10-CM | POA: Diagnosis not present

## 2015-02-24 DIAGNOSIS — D631 Anemia in chronic kidney disease: Secondary | ICD-10-CM | POA: Diagnosis not present

## 2015-02-24 DIAGNOSIS — I2699 Other pulmonary embolism without acute cor pulmonale: Secondary | ICD-10-CM

## 2015-02-24 DIAGNOSIS — N186 End stage renal disease: Secondary | ICD-10-CM | POA: Diagnosis not present

## 2015-02-24 MED ORDER — OXYCODONE-ACETAMINOPHEN 10-325 MG PO TABS: 1.0000 | ORAL_TABLET | ORAL | Status: DC | PRN

## 2015-02-24 NOTE — Addendum Note (Signed)
Addended by: Forde Dandy on: 02/24/2015 09:49 AM   Modules accepted: Orders

## 2015-02-24 NOTE — Telephone Encounter (Signed)
Lm for rtc to inform script ready

## 2015-02-24 NOTE — Telephone Encounter (Signed)
CLINICAL PHARMACY ANTICOAGULATION MANAGEMENT Brian [name] is a 39 y.o. male who reports to the clinic for monitoring of warfarin treatment.    Indication: PE Duration: indefinite  Anticoagulation Clinic Visit History: ASSESSMENT Recent Results: Recent results are below, the most recent result is correlated with a dose of 52.5 mg per week: Lab Results  Component Value Date   INR 1.27 09/06/2014   INR 2.12* 08/12/2014   INR 2.3 11/05/2013    INR today: Subtherapeutic Findings: Patient has dialysis on MWF.  Anticoagulation Dosing: Patient states he has been taking 7.5 mg (1 and 1/2 tablets) daily, 52.5 mg per week.  PLAN Weekly dose was increased by 19% to 62.5 mg per week.  Patient Instructions: There are no Patient Instructions on file for this visit.  Follow-up No Follow-up on file.  [name], PharmD BCPS, BCACP

## 2015-02-27 DIAGNOSIS — D631 Anemia in chronic kidney disease: Secondary | ICD-10-CM | POA: Diagnosis not present

## 2015-02-27 DIAGNOSIS — N186 End stage renal disease: Secondary | ICD-10-CM | POA: Diagnosis not present

## 2015-02-27 DIAGNOSIS — N2581 Secondary hyperparathyroidism of renal origin: Secondary | ICD-10-CM | POA: Diagnosis not present

## 2015-02-27 DIAGNOSIS — D509 Iron deficiency anemia, unspecified: Secondary | ICD-10-CM | POA: Diagnosis not present

## 2015-02-27 DIAGNOSIS — N189 Chronic kidney disease, unspecified: Secondary | ICD-10-CM | POA: Diagnosis not present

## 2015-02-27 DIAGNOSIS — Q613 Polycystic kidney, unspecified: Secondary | ICD-10-CM | POA: Diagnosis not present

## 2015-02-27 DIAGNOSIS — Z01812 Encounter for preprocedural laboratory examination: Secondary | ICD-10-CM | POA: Diagnosis not present

## 2015-02-28 NOTE — Telephone Encounter (Signed)
Refill done.  

## 2015-03-01 DIAGNOSIS — I8291 Chronic embolism and thrombosis of unspecified vein: Secondary | ICD-10-CM | POA: Diagnosis not present

## 2015-03-01 DIAGNOSIS — D631 Anemia in chronic kidney disease: Secondary | ICD-10-CM | POA: Diagnosis not present

## 2015-03-01 DIAGNOSIS — N2581 Secondary hyperparathyroidism of renal origin: Secondary | ICD-10-CM | POA: Diagnosis not present

## 2015-03-01 DIAGNOSIS — N186 End stage renal disease: Secondary | ICD-10-CM | POA: Diagnosis not present

## 2015-03-01 DIAGNOSIS — D509 Iron deficiency anemia, unspecified: Secondary | ICD-10-CM | POA: Diagnosis not present

## 2015-03-03 DIAGNOSIS — D631 Anemia in chronic kidney disease: Secondary | ICD-10-CM | POA: Diagnosis not present

## 2015-03-03 DIAGNOSIS — N2581 Secondary hyperparathyroidism of renal origin: Secondary | ICD-10-CM | POA: Diagnosis not present

## 2015-03-03 DIAGNOSIS — D509 Iron deficiency anemia, unspecified: Secondary | ICD-10-CM | POA: Diagnosis not present

## 2015-03-03 DIAGNOSIS — N186 End stage renal disease: Secondary | ICD-10-CM | POA: Diagnosis not present

## 2015-03-06 DIAGNOSIS — N2581 Secondary hyperparathyroidism of renal origin: Secondary | ICD-10-CM | POA: Diagnosis not present

## 2015-03-06 DIAGNOSIS — N186 End stage renal disease: Secondary | ICD-10-CM | POA: Diagnosis not present

## 2015-03-06 DIAGNOSIS — D631 Anemia in chronic kidney disease: Secondary | ICD-10-CM | POA: Diagnosis not present

## 2015-03-06 DIAGNOSIS — D509 Iron deficiency anemia, unspecified: Secondary | ICD-10-CM | POA: Diagnosis not present

## 2015-03-08 DIAGNOSIS — D631 Anemia in chronic kidney disease: Secondary | ICD-10-CM | POA: Diagnosis not present

## 2015-03-08 DIAGNOSIS — N2581 Secondary hyperparathyroidism of renal origin: Secondary | ICD-10-CM | POA: Diagnosis not present

## 2015-03-08 DIAGNOSIS — D509 Iron deficiency anemia, unspecified: Secondary | ICD-10-CM | POA: Diagnosis not present

## 2015-03-08 DIAGNOSIS — N186 End stage renal disease: Secondary | ICD-10-CM | POA: Diagnosis not present

## 2015-03-09 DIAGNOSIS — D631 Anemia in chronic kidney disease: Secondary | ICD-10-CM | POA: Diagnosis not present

## 2015-03-09 DIAGNOSIS — N186 End stage renal disease: Secondary | ICD-10-CM | POA: Diagnosis not present

## 2015-03-09 DIAGNOSIS — N2581 Secondary hyperparathyroidism of renal origin: Secondary | ICD-10-CM | POA: Diagnosis not present

## 2015-03-09 DIAGNOSIS — D509 Iron deficiency anemia, unspecified: Secondary | ICD-10-CM | POA: Diagnosis not present

## 2015-03-10 ENCOUNTER — Other Ambulatory Visit: Payer: Self-pay | Admitting: Internal Medicine

## 2015-03-10 DIAGNOSIS — F17210 Nicotine dependence, cigarettes, uncomplicated: Secondary | ICD-10-CM | POA: Diagnosis present

## 2015-03-10 DIAGNOSIS — D62 Acute posthemorrhagic anemia: Secondary | ICD-10-CM | POA: Diagnosis not present

## 2015-03-10 DIAGNOSIS — D689 Coagulation defect, unspecified: Secondary | ICD-10-CM | POA: Diagnosis present

## 2015-03-10 DIAGNOSIS — Z86711 Personal history of pulmonary embolism: Secondary | ICD-10-CM | POA: Diagnosis not present

## 2015-03-10 DIAGNOSIS — Q548 Other hypospadias: Secondary | ICD-10-CM | POA: Diagnosis not present

## 2015-03-10 DIAGNOSIS — I509 Heart failure, unspecified: Secondary | ICD-10-CM | POA: Diagnosis present

## 2015-03-10 DIAGNOSIS — N038 Chronic nephritic syndrome with other morphologic changes: Secondary | ICD-10-CM | POA: Diagnosis not present

## 2015-03-10 DIAGNOSIS — Z992 Dependence on renal dialysis: Secondary | ICD-10-CM | POA: Diagnosis not present

## 2015-03-10 DIAGNOSIS — Z86718 Personal history of other venous thrombosis and embolism: Secondary | ICD-10-CM | POA: Diagnosis not present

## 2015-03-10 DIAGNOSIS — N186 End stage renal disease: Secondary | ICD-10-CM | POA: Diagnosis present

## 2015-03-10 DIAGNOSIS — E871 Hypo-osmolality and hyponatremia: Secondary | ICD-10-CM | POA: Diagnosis not present

## 2015-03-10 DIAGNOSIS — I120 Hypertensive chronic kidney disease with stage 5 chronic kidney disease or end stage renal disease: Secondary | ICD-10-CM | POA: Diagnosis present

## 2015-03-10 DIAGNOSIS — B20 Human immunodeficiency virus [HIV] disease: Secondary | ICD-10-CM | POA: Diagnosis not present

## 2015-03-10 DIAGNOSIS — Q612 Polycystic kidney, adult type: Secondary | ICD-10-CM | POA: Diagnosis not present

## 2015-03-10 DIAGNOSIS — R319 Hematuria, unspecified: Secondary | ICD-10-CM | POA: Diagnosis present

## 2015-03-10 DIAGNOSIS — G8929 Other chronic pain: Secondary | ICD-10-CM | POA: Diagnosis present

## 2015-03-10 DIAGNOSIS — Q613 Polycystic kidney, unspecified: Secondary | ICD-10-CM | POA: Diagnosis not present

## 2015-03-10 DIAGNOSIS — Z21 Asymptomatic human immunodeficiency virus [HIV] infection status: Secondary | ICD-10-CM | POA: Diagnosis present

## 2015-03-10 DIAGNOSIS — J449 Chronic obstructive pulmonary disease, unspecified: Secondary | ICD-10-CM | POA: Diagnosis present

## 2015-03-10 DIAGNOSIS — N2881 Hypertrophy of kidney: Secondary | ICD-10-CM | POA: Diagnosis not present

## 2015-03-13 ENCOUNTER — Other Ambulatory Visit: Payer: Self-pay | Admitting: Internal Medicine

## 2015-03-15 DIAGNOSIS — D631 Anemia in chronic kidney disease: Secondary | ICD-10-CM | POA: Diagnosis not present

## 2015-03-15 DIAGNOSIS — D509 Iron deficiency anemia, unspecified: Secondary | ICD-10-CM | POA: Diagnosis not present

## 2015-03-15 DIAGNOSIS — N186 End stage renal disease: Secondary | ICD-10-CM | POA: Diagnosis not present

## 2015-03-15 DIAGNOSIS — N2581 Secondary hyperparathyroidism of renal origin: Secondary | ICD-10-CM | POA: Diagnosis not present

## 2015-03-16 DIAGNOSIS — I120 Hypertensive chronic kidney disease with stage 5 chronic kidney disease or end stage renal disease: Secondary | ICD-10-CM | POA: Diagnosis not present

## 2015-03-16 DIAGNOSIS — Z992 Dependence on renal dialysis: Secondary | ICD-10-CM | POA: Diagnosis not present

## 2015-03-16 DIAGNOSIS — N186 End stage renal disease: Secondary | ICD-10-CM | POA: Diagnosis not present

## 2015-03-17 DIAGNOSIS — D631 Anemia in chronic kidney disease: Secondary | ICD-10-CM | POA: Diagnosis not present

## 2015-03-17 DIAGNOSIS — N2581 Secondary hyperparathyroidism of renal origin: Secondary | ICD-10-CM | POA: Diagnosis not present

## 2015-03-17 DIAGNOSIS — D509 Iron deficiency anemia, unspecified: Secondary | ICD-10-CM | POA: Diagnosis not present

## 2015-03-17 DIAGNOSIS — N186 End stage renal disease: Secondary | ICD-10-CM | POA: Diagnosis not present

## 2015-03-17 DIAGNOSIS — I8291 Chronic embolism and thrombosis of unspecified vein: Secondary | ICD-10-CM | POA: Diagnosis not present

## 2015-03-20 ENCOUNTER — Telehealth: Payer: Self-pay | Admitting: Pharmacist

## 2015-03-20 DIAGNOSIS — D509 Iron deficiency anemia, unspecified: Secondary | ICD-10-CM | POA: Diagnosis not present

## 2015-03-20 DIAGNOSIS — N186 End stage renal disease: Secondary | ICD-10-CM | POA: Diagnosis not present

## 2015-03-20 DIAGNOSIS — N2581 Secondary hyperparathyroidism of renal origin: Secondary | ICD-10-CM | POA: Diagnosis not present

## 2015-03-20 DIAGNOSIS — D631 Anemia in chronic kidney disease: Secondary | ICD-10-CM | POA: Diagnosis not present

## 2015-03-20 NOTE — Telephone Encounter (Signed)
Called patient at [facility] and advised to continue taking 7.5mg  (1&1/2 tablets of 5mg ) on Saturdays/Sundays; take 2x5mg  (10mg ) all other days. Next INR at [facility] in 2 weeks before going on HD.

## 2015-03-22 DIAGNOSIS — N2581 Secondary hyperparathyroidism of renal origin: Secondary | ICD-10-CM | POA: Diagnosis not present

## 2015-03-22 DIAGNOSIS — D631 Anemia in chronic kidney disease: Secondary | ICD-10-CM | POA: Diagnosis not present

## 2015-03-22 DIAGNOSIS — N186 End stage renal disease: Secondary | ICD-10-CM | POA: Diagnosis not present

## 2015-03-22 DIAGNOSIS — D509 Iron deficiency anemia, unspecified: Secondary | ICD-10-CM | POA: Diagnosis not present

## 2015-03-24 DIAGNOSIS — D631 Anemia in chronic kidney disease: Secondary | ICD-10-CM | POA: Diagnosis not present

## 2015-03-24 DIAGNOSIS — D509 Iron deficiency anemia, unspecified: Secondary | ICD-10-CM | POA: Diagnosis not present

## 2015-03-24 DIAGNOSIS — N186 End stage renal disease: Secondary | ICD-10-CM | POA: Diagnosis not present

## 2015-03-24 DIAGNOSIS — N2581 Secondary hyperparathyroidism of renal origin: Secondary | ICD-10-CM | POA: Diagnosis not present

## 2015-03-27 DIAGNOSIS — N2581 Secondary hyperparathyroidism of renal origin: Secondary | ICD-10-CM | POA: Diagnosis not present

## 2015-03-27 DIAGNOSIS — D631 Anemia in chronic kidney disease: Secondary | ICD-10-CM | POA: Diagnosis not present

## 2015-03-27 DIAGNOSIS — N186 End stage renal disease: Secondary | ICD-10-CM | POA: Diagnosis not present

## 2015-03-27 DIAGNOSIS — D509 Iron deficiency anemia, unspecified: Secondary | ICD-10-CM | POA: Diagnosis not present

## 2015-03-28 ENCOUNTER — Other Ambulatory Visit: Payer: Self-pay | Admitting: *Deleted

## 2015-03-28 ENCOUNTER — Telehealth: Payer: Self-pay | Admitting: *Deleted

## 2015-03-28 NOTE — Telephone Encounter (Signed)
Received faxed PA request from pt's pharmacy for his zolpidem 5 mg tabs #30.  Pt's insurance was contacted and informed me that PA was not needed. Rx was trying to be filled too soon.  Next avail refill on 04/05/2015.  Phone call complete.Despina Hidden Cassady4/12/20169:27 AM        Insurance InfoHolland Falling Ph# [phone] BIN: [id number] PCN: [id number] Card ID: [id number] Group#: [id number]

## 2015-03-29 DIAGNOSIS — N2581 Secondary hyperparathyroidism of renal origin: Secondary | ICD-10-CM | POA: Diagnosis not present

## 2015-03-29 DIAGNOSIS — I8291 Chronic embolism and thrombosis of unspecified vein: Secondary | ICD-10-CM | POA: Diagnosis not present

## 2015-03-29 DIAGNOSIS — D631 Anemia in chronic kidney disease: Secondary | ICD-10-CM | POA: Diagnosis not present

## 2015-03-29 DIAGNOSIS — D509 Iron deficiency anemia, unspecified: Secondary | ICD-10-CM | POA: Diagnosis not present

## 2015-03-29 DIAGNOSIS — N186 End stage renal disease: Secondary | ICD-10-CM | POA: Diagnosis not present

## 2015-03-29 MED ORDER — OXYCODONE-ACETAMINOPHEN 10-325 MG PO TABS: 1.0000 | ORAL_TABLET | ORAL | Status: DC | PRN

## 2015-03-30 ENCOUNTER — Other Ambulatory Visit: Payer: Self-pay | Admitting: Oncology

## 2015-03-30 MED ORDER — OXYCODONE-ACETAMINOPHEN 10-325 MG PO TABS: 1.0000 | ORAL_TABLET | ORAL | Status: DC | PRN

## 2015-03-31 ENCOUNTER — Telehealth: Payer: Self-pay | Admitting: Pharmacist

## 2015-03-31 DIAGNOSIS — D509 Iron deficiency anemia, unspecified: Secondary | ICD-10-CM | POA: Diagnosis not present

## 2015-03-31 DIAGNOSIS — N186 End stage renal disease: Secondary | ICD-10-CM | POA: Diagnosis not present

## 2015-03-31 DIAGNOSIS — Z86718 Personal history of other venous thrombosis and embolism: Secondary | ICD-10-CM

## 2015-03-31 DIAGNOSIS — N2581 Secondary hyperparathyroidism of renal origin: Secondary | ICD-10-CM | POA: Diagnosis not present

## 2015-03-31 DIAGNOSIS — D631 Anemia in chronic kidney disease: Secondary | ICD-10-CM | POA: Diagnosis not present

## 2015-03-31 DIAGNOSIS — Z86711 Personal history of pulmonary embolism: Secondary | ICD-10-CM

## 2015-03-31 NOTE — Telephone Encounter (Signed)
CLINICAL PHARMACY ANTICOAGULATION MANAGEMENT DAMARIOUS [name] is a 39 y.o. male who was contacted for warfarin management. Patient has INR checked by [facility] prior to dialysis sessions.  Indication: DVT and PE Duration: indefinite  ASSESSMENT Recent Results: Recent results are below, the most recent result is correlated with a dose of 65 mg per week: Lab Results  Component Value Date   INR 1.32 03/31/2015   INR 2.33 03/20/2015   INR 1.24 10/03/2014   INR 1.27 09/06/2014   INR 2.12* 08/12/2014   INR 2.3 11/05/2013    INR today: Subtherapeutic  Anticoagulation Dosing: 10 mg (2 tablets) daily   PLAN Weekly dose was increased by 8% to 70 mg per week  There are no Patient Instructions on file for this visit.  Follow-up No Follow-up on file.  [name] Clinical Pharmacist

## 2015-04-03 DIAGNOSIS — D631 Anemia in chronic kidney disease: Secondary | ICD-10-CM | POA: Diagnosis not present

## 2015-04-03 DIAGNOSIS — N2581 Secondary hyperparathyroidism of renal origin: Secondary | ICD-10-CM | POA: Diagnosis not present

## 2015-04-03 DIAGNOSIS — D509 Iron deficiency anemia, unspecified: Secondary | ICD-10-CM | POA: Diagnosis not present

## 2015-04-03 DIAGNOSIS — N186 End stage renal disease: Secondary | ICD-10-CM | POA: Diagnosis not present

## 2015-04-05 DIAGNOSIS — N2581 Secondary hyperparathyroidism of renal origin: Secondary | ICD-10-CM | POA: Diagnosis not present

## 2015-04-05 DIAGNOSIS — N186 End stage renal disease: Secondary | ICD-10-CM | POA: Diagnosis not present

## 2015-04-05 DIAGNOSIS — D509 Iron deficiency anemia, unspecified: Secondary | ICD-10-CM | POA: Diagnosis not present

## 2015-04-05 DIAGNOSIS — D631 Anemia in chronic kidney disease: Secondary | ICD-10-CM | POA: Diagnosis not present

## 2015-04-05 DIAGNOSIS — I8291 Chronic embolism and thrombosis of unspecified vein: Secondary | ICD-10-CM | POA: Diagnosis not present

## 2015-04-06 ENCOUNTER — Other Ambulatory Visit: Payer: Self-pay | Admitting: Cardiology

## 2015-04-06 ENCOUNTER — Telehealth: Payer: Self-pay | Admitting: Pharmacist

## 2015-04-06 ENCOUNTER — Other Ambulatory Visit: Payer: Self-pay | Admitting: Internal Medicine

## 2015-04-06 DIAGNOSIS — Z7901 Long term (current) use of anticoagulants: Secondary | ICD-10-CM

## 2015-04-06 NOTE — Telephone Encounter (Signed)
CLINICAL PHARMACY ANTICOAGULATION MANAGEMENT Brian [name] is a 39 y.o. male who reports to the clinic for monitoring of warfarin treatment.    Indication: PEhistory Duration: indefinite  Anticoagulation Clinic Visit History: @ANTICOAGSUMMARY @ ASSESSMENT INR 1.59 today per [facility], correlated with an unknown weekly dose due to patient reporting possibly missing 2 doses  INR today: Subtherapeutic  Anticoagulation Dosing: 70 mg weekly (10 mg daily)  PLAN Weekly dose was unchanged due to non-adherence. Reinforced importance of adherence.  There are no Patient Instructions on file for this visit.  Follow-up 1 week  [place] Pharmacist

## 2015-04-06 NOTE — Addendum Note (Signed)
Addended by: Forde Dandy on: 04/06/2015 06:56 PM   Modules accepted: Orders

## 2015-04-07 DIAGNOSIS — D631 Anemia in chronic kidney disease: Secondary | ICD-10-CM | POA: Diagnosis not present

## 2015-04-07 DIAGNOSIS — N2581 Secondary hyperparathyroidism of renal origin: Secondary | ICD-10-CM | POA: Diagnosis not present

## 2015-04-07 DIAGNOSIS — D509 Iron deficiency anemia, unspecified: Secondary | ICD-10-CM | POA: Diagnosis not present

## 2015-04-07 DIAGNOSIS — N186 End stage renal disease: Secondary | ICD-10-CM | POA: Diagnosis not present

## 2015-04-07 NOTE — Telephone Encounter (Signed)
Ambien 5 mg rx called to [facility].

## 2015-04-10 DIAGNOSIS — D509 Iron deficiency anemia, unspecified: Secondary | ICD-10-CM | POA: Diagnosis not present

## 2015-04-10 DIAGNOSIS — N2581 Secondary hyperparathyroidism of renal origin: Secondary | ICD-10-CM | POA: Diagnosis not present

## 2015-04-10 DIAGNOSIS — D631 Anemia in chronic kidney disease: Secondary | ICD-10-CM | POA: Diagnosis not present

## 2015-04-10 DIAGNOSIS — N186 End stage renal disease: Secondary | ICD-10-CM | POA: Diagnosis not present

## 2015-04-12 DIAGNOSIS — N186 End stage renal disease: Secondary | ICD-10-CM | POA: Diagnosis not present

## 2015-04-12 DIAGNOSIS — N2581 Secondary hyperparathyroidism of renal origin: Secondary | ICD-10-CM | POA: Diagnosis not present

## 2015-04-12 DIAGNOSIS — D631 Anemia in chronic kidney disease: Secondary | ICD-10-CM | POA: Diagnosis not present

## 2015-04-12 DIAGNOSIS — D509 Iron deficiency anemia, unspecified: Secondary | ICD-10-CM | POA: Diagnosis not present

## 2015-04-14 ENCOUNTER — Other Ambulatory Visit: Payer: Self-pay | Admitting: Internal Medicine

## 2015-04-14 DIAGNOSIS — N2581 Secondary hyperparathyroidism of renal origin: Secondary | ICD-10-CM | POA: Diagnosis not present

## 2015-04-14 DIAGNOSIS — D631 Anemia in chronic kidney disease: Secondary | ICD-10-CM | POA: Diagnosis not present

## 2015-04-14 DIAGNOSIS — D509 Iron deficiency anemia, unspecified: Secondary | ICD-10-CM | POA: Diagnosis not present

## 2015-04-14 DIAGNOSIS — N186 End stage renal disease: Secondary | ICD-10-CM | POA: Diagnosis not present

## 2015-04-15 DIAGNOSIS — I120 Hypertensive chronic kidney disease with stage 5 chronic kidney disease or end stage renal disease: Secondary | ICD-10-CM | POA: Diagnosis not present

## 2015-04-15 DIAGNOSIS — Z992 Dependence on renal dialysis: Secondary | ICD-10-CM | POA: Diagnosis not present

## 2015-04-15 DIAGNOSIS — N186 End stage renal disease: Secondary | ICD-10-CM | POA: Diagnosis not present

## 2015-04-17 DIAGNOSIS — D631 Anemia in chronic kidney disease: Secondary | ICD-10-CM | POA: Diagnosis not present

## 2015-04-17 DIAGNOSIS — N2581 Secondary hyperparathyroidism of renal origin: Secondary | ICD-10-CM | POA: Diagnosis not present

## 2015-04-17 DIAGNOSIS — D509 Iron deficiency anemia, unspecified: Secondary | ICD-10-CM | POA: Diagnosis not present

## 2015-04-17 DIAGNOSIS — N186 End stage renal disease: Secondary | ICD-10-CM | POA: Diagnosis not present

## 2015-04-19 DIAGNOSIS — D631 Anemia in chronic kidney disease: Secondary | ICD-10-CM | POA: Diagnosis not present

## 2015-04-19 DIAGNOSIS — D509 Iron deficiency anemia, unspecified: Secondary | ICD-10-CM | POA: Diagnosis not present

## 2015-04-19 DIAGNOSIS — N2581 Secondary hyperparathyroidism of renal origin: Secondary | ICD-10-CM | POA: Diagnosis not present

## 2015-04-19 DIAGNOSIS — N186 End stage renal disease: Secondary | ICD-10-CM | POA: Diagnosis not present

## 2015-04-20 DIAGNOSIS — K429 Umbilical hernia without obstruction or gangrene: Secondary | ICD-10-CM | POA: Diagnosis not present

## 2015-04-20 DIAGNOSIS — K439 Ventral hernia without obstruction or gangrene: Secondary | ICD-10-CM | POA: Diagnosis not present

## 2015-04-21 DIAGNOSIS — N2581 Secondary hyperparathyroidism of renal origin: Secondary | ICD-10-CM | POA: Diagnosis not present

## 2015-04-21 DIAGNOSIS — N186 End stage renal disease: Secondary | ICD-10-CM | POA: Diagnosis not present

## 2015-04-21 DIAGNOSIS — D631 Anemia in chronic kidney disease: Secondary | ICD-10-CM | POA: Diagnosis not present

## 2015-04-21 DIAGNOSIS — D509 Iron deficiency anemia, unspecified: Secondary | ICD-10-CM | POA: Diagnosis not present

## 2015-04-23 ENCOUNTER — Emergency Department (HOSPITAL_COMMUNITY): Payer: Medicare Other

## 2015-04-23 ENCOUNTER — Encounter (HOSPITAL_COMMUNITY): Payer: Self-pay

## 2015-04-23 ENCOUNTER — Observation Stay (HOSPITAL_COMMUNITY)
Admission: EM | Admit: 2015-04-23 | Discharge: 2015-04-24 | Disposition: A | Discharge status: Home / Self Care | Payer: Medicare Other | Attending: Internal Medicine | Admitting: Internal Medicine

## 2015-04-23 ENCOUNTER — Other Ambulatory Visit (HOSPITAL_COMMUNITY): Payer: Self-pay

## 2015-04-23 DIAGNOSIS — E875 Hyperkalemia: Secondary | ICD-10-CM | POA: Diagnosis present

## 2015-04-23 DIAGNOSIS — Z86711 Personal history of pulmonary embolism: Secondary | ICD-10-CM | POA: Insufficient documentation

## 2015-04-23 DIAGNOSIS — Y92009 Unspecified place in unspecified non-institutional (private) residence as the place of occurrence of the external cause: Secondary | ICD-10-CM | POA: Insufficient documentation

## 2015-04-23 DIAGNOSIS — R0789 Other chest pain: Secondary | ICD-10-CM | POA: Diagnosis not present

## 2015-04-23 DIAGNOSIS — I5022 Chronic systolic (congestive) heart failure: Secondary | ICD-10-CM | POA: Diagnosis not present

## 2015-04-23 DIAGNOSIS — I712 Thoracic aortic aneurysm, without rupture: Secondary | ICD-10-CM | POA: Diagnosis not present

## 2015-04-23 DIAGNOSIS — F418 Other specified anxiety disorders: Secondary | ICD-10-CM | POA: Diagnosis not present

## 2015-04-23 DIAGNOSIS — Z5181 Encounter for therapeutic drug level monitoring: Secondary | ICD-10-CM | POA: Diagnosis present

## 2015-04-23 DIAGNOSIS — D539 Nutritional anemia, unspecified: Secondary | ICD-10-CM | POA: Insufficient documentation

## 2015-04-23 DIAGNOSIS — F17200 Nicotine dependence, unspecified, uncomplicated: Secondary | ICD-10-CM | POA: Insufficient documentation

## 2015-04-23 DIAGNOSIS — Z992 Dependence on renal dialysis: Secondary | ICD-10-CM | POA: Insufficient documentation

## 2015-04-23 DIAGNOSIS — T148 Other injury of unspecified body region: Secondary | ICD-10-CM | POA: Diagnosis not present

## 2015-04-23 DIAGNOSIS — G8929 Other chronic pain: Secondary | ICD-10-CM | POA: Diagnosis not present

## 2015-04-23 DIAGNOSIS — S0181XA Laceration without foreign body of other part of head, initial encounter: Secondary | ICD-10-CM | POA: Insufficient documentation

## 2015-04-23 DIAGNOSIS — Z79899 Other long term (current) drug therapy: Secondary | ICD-10-CM | POA: Diagnosis not present

## 2015-04-23 DIAGNOSIS — M549 Dorsalgia, unspecified: Secondary | ICD-10-CM | POA: Diagnosis not present

## 2015-04-23 DIAGNOSIS — Z905 Acquired absence of kidney: Secondary | ICD-10-CM | POA: Insufficient documentation

## 2015-04-23 DIAGNOSIS — S0993XA Unspecified injury of face, initial encounter: Secondary | ICD-10-CM | POA: Diagnosis not present

## 2015-04-23 DIAGNOSIS — S0083XA Contusion of other part of head, initial encounter: Secondary | ICD-10-CM

## 2015-04-23 DIAGNOSIS — F10129 Alcohol abuse with intoxication, unspecified: Principal | ICD-10-CM | POA: Insufficient documentation

## 2015-04-23 DIAGNOSIS — S3991XA Unspecified injury of abdomen, initial encounter: Secondary | ICD-10-CM | POA: Diagnosis not present

## 2015-04-23 DIAGNOSIS — N186 End stage renal disease: Secondary | ICD-10-CM | POA: Insufficient documentation

## 2015-04-23 DIAGNOSIS — R000 Tachycardia, unspecified: Secondary | ICD-10-CM | POA: Diagnosis not present

## 2015-04-23 DIAGNOSIS — I120 Hypertensive chronic kidney disease with stage 5 chronic kidney disease or end stage renal disease: Secondary | ICD-10-CM | POA: Insufficient documentation

## 2015-04-23 DIAGNOSIS — W1830XA Fall on same level, unspecified, initial encounter: Secondary | ICD-10-CM | POA: Diagnosis not present

## 2015-04-23 DIAGNOSIS — R109 Unspecified abdominal pain: Secondary | ICD-10-CM | POA: Diagnosis not present

## 2015-04-23 DIAGNOSIS — Z21 Asymptomatic human immunodeficiency virus [HIV] infection status: Secondary | ICD-10-CM | POA: Insufficient documentation

## 2015-04-23 DIAGNOSIS — S0190XA Unspecified open wound of unspecified part of head, initial encounter: Secondary | ICD-10-CM | POA: Diagnosis not present

## 2015-04-23 DIAGNOSIS — Z7901 Long term (current) use of anticoagulants: Secondary | ICD-10-CM | POA: Insufficient documentation

## 2015-04-23 DIAGNOSIS — S299XXA Unspecified injury of thorax, initial encounter: Secondary | ICD-10-CM | POA: Diagnosis not present

## 2015-04-23 DIAGNOSIS — S0990XA Unspecified injury of head, initial encounter: Secondary | ICD-10-CM | POA: Diagnosis not present

## 2015-04-23 DIAGNOSIS — S199XXA Unspecified injury of neck, initial encounter: Secondary | ICD-10-CM | POA: Diagnosis not present

## 2015-04-23 DIAGNOSIS — S3993XA Unspecified injury of pelvis, initial encounter: Secondary | ICD-10-CM | POA: Diagnosis not present

## 2015-04-23 LAB — BASIC METABOLIC PANEL
Anion gap: 20 — ABNORMAL HIGH (ref 5–15)
BUN: 91 mg/dL — ABNORMAL HIGH (ref 6–20)
CO2: 23 mmol/L (ref 22–32)
Calcium: 8.5 mg/dL — ABNORMAL LOW (ref 8.9–10.3)
Chloride: 91 mmol/L — ABNORMAL LOW (ref 101–111)
Creatinine, Ser: 9.1 mg/dL — ABNORMAL HIGH (ref 0.61–1.24)
GFR calc Af Amer: 7 mL/min — ABNORMAL LOW (ref >60)
GFR calc non Af Amer: 6 mL/min — ABNORMAL LOW (ref >60)
Glucose, Bld: 105 mg/dL — ABNORMAL HIGH (ref 70–99)
Potassium: 6.8 mmol/L (ref 3.5–5.1)
Sodium: 134 mmol/L — ABNORMAL LOW (ref 135–145)

## 2015-04-23 LAB — CBC WITH DIFFERENTIAL/PLATELET
Basophils Absolute: 0 10*3/uL (ref 0.0–0.1)
Basophils Relative: 0 % (ref 0–1)
Eosinophils Absolute: 0 10*3/uL (ref 0.0–0.7)
Eosinophils Relative: 0 % (ref 0–5)
HCT: 32.3 % — ABNORMAL LOW (ref 39.0–52.0)
Hemoglobin: 10.9 g/dL — ABNORMAL LOW (ref 13.0–17.0)
Lymphocytes Relative: 16 % (ref 12–46)
Lymphs Abs: 1 10*3/uL (ref 0.7–4.0)
MCH: 34.5 pg — ABNORMAL HIGH (ref 26.0–34.0)
MCHC: 33.7 g/dL (ref 30.0–36.0)
MCV: 102.2 fL — ABNORMAL HIGH (ref 78.0–100.0)
Monocytes Absolute: 0.7 10*3/uL (ref 0.1–1.0)
Monocytes Relative: 10 % (ref 3–12)
Neutro Abs: 4.7 10*3/uL (ref 1.7–7.7)
Neutrophils Relative %: 74 % (ref 43–77)
Platelets: 161 10*3/uL (ref 150–400)
RBC: 3.16 MIL/uL — ABNORMAL LOW (ref 4.22–5.81)
RDW: 15.8 % — ABNORMAL HIGH (ref 11.5–15.5)
WBC: 6.4 10*3/uL (ref 4.0–10.5)

## 2015-04-23 LAB — PROTIME-INR
INR: 2.83 — ABNORMAL HIGH (ref 0.00–1.49)
Prothrombin Time: 30 seconds — ABNORMAL HIGH (ref 11.6–15.2)

## 2015-04-23 MED ORDER — VITAMIN B-1 100 MG PO TABS
100.0000 mg | ORAL_TABLET | Freq: Every day | ORAL | Status: DC
  Filled 2015-04-23: qty 1

## 2015-04-23 MED ORDER — WARFARIN SODIUM 10 MG PO TABS
10.0000 mg | ORAL_TABLET | ORAL | Status: AC
  Administered 2015-04-24: 10 mg via ORAL
  Filled 2015-04-23: qty 1

## 2015-04-23 MED ORDER — RENA-VITE PO TABS
1.0000 | ORAL_TABLET | Freq: Every day | ORAL | Status: DC
  Administered 2015-04-24: 1 via ORAL
  Filled 2015-04-23 (×2): qty 1

## 2015-04-23 MED ORDER — DEXTROSE 50 % IV SOLN
1.0000 | Freq: Once | INTRAVENOUS | Status: AC
  Administered 2015-04-23: 50 mL via INTRAVENOUS
  Filled 2015-04-23: qty 50

## 2015-04-23 MED ORDER — DOCUSATE SODIUM 100 MG PO CAPS
100.0000 mg | ORAL_CAPSULE | Freq: Two times a day (BID) | ORAL | Status: DC
  Administered 2015-04-24: 100 mg via ORAL
  Filled 2015-04-23 (×3): qty 1

## 2015-04-23 MED ORDER — SODIUM POLYSTYRENE SULFONATE 15 GM/60ML PO SUSP
30.0000 g | Freq: Once | ORAL | Status: AC
  Administered 2015-04-23: 30 g via ORAL
  Filled 2015-04-23: qty 120

## 2015-04-23 MED ORDER — LORAZEPAM 2 MG/ML IJ SOLN: 0.5000 mg | Freq: Four times a day (QID) | INTRAMUSCULAR | Status: DC | PRN

## 2015-04-23 MED ORDER — HEPARIN SODIUM (PORCINE) 1000 UNIT/ML DIALYSIS
2000.0000 [IU] | INTRAMUSCULAR | Status: DC | PRN
  Filled 2015-04-23: qty 2

## 2015-04-23 MED ORDER — CLONAZEPAM 1 MG PO TABS
1.0000 mg | ORAL_TABLET | Freq: Every day | ORAL | Status: DC
  Administered 2015-04-24: 1 mg via ORAL
  Filled 2015-04-23: qty 1

## 2015-04-23 MED ORDER — ALBUTEROL SULFATE (2.5 MG/3ML) 0.083% IN NEBU
2.5000 mg | INHALATION_SOLUTION | RESPIRATORY_TRACT | Status: DC
  Administered 2015-04-24: 2.5 mg via RESPIRATORY_TRACT
  Filled 2015-04-23: qty 3

## 2015-04-23 MED ORDER — IOHEXOL 300 MG/ML  SOLN: 100.0000 mL | Freq: Once | INTRAMUSCULAR | Status: DC | PRN

## 2015-04-23 MED ORDER — SODIUM BICARBONATE 4 % IV SOLN
5.0000 mL | Freq: Once | INTRAVENOUS | Status: AC
  Administered 2015-04-23: 5 mL via INTRAVENOUS
  Filled 2015-04-23: qty 5

## 2015-04-23 MED ORDER — ONDANSETRON HCL 4 MG/2ML IJ SOLN
4.0000 mg | Freq: Three times a day (TID) | INTRAMUSCULAR | Status: AC | PRN
Start: 2015-04-23 — End: 2015-04-24
  Administered 2015-04-24: 4 mg via INTRAVENOUS
  Filled 2015-04-23: qty 2

## 2015-04-23 MED ORDER — TRAZODONE HCL 50 MG PO TABS
50.0000 mg | ORAL_TABLET | Freq: Every day | ORAL | Status: DC
  Administered 2015-04-24: 50 mg via ORAL
  Filled 2015-04-23 (×2): qty 1

## 2015-04-23 MED ORDER — LORAZEPAM 0.5 MG PO TABS
0.5000 mg | ORAL_TABLET | Freq: Four times a day (QID) | ORAL | Status: DC | PRN
  Administered 2015-04-24: 0.5 mg via ORAL
  Filled 2015-04-23: qty 1

## 2015-04-23 MED ORDER — NEPRO/CARBSTEADY PO LIQD: 237.0000 mL | ORAL | Status: DC | PRN

## 2015-04-23 MED ORDER — SODIUM CHLORIDE 0.9 % IV SOLN: 100.0000 mL | INTRAVENOUS | Status: DC | PRN

## 2015-04-23 MED ORDER — LIDOCAINE HCL (PF) 1 % IJ SOLN: 5.0000 mL | INTRAMUSCULAR | Status: DC | PRN

## 2015-04-23 MED ORDER — FOLIC ACID 1 MG PO TABS
1.0000 mg | ORAL_TABLET | Freq: Every day | ORAL | Status: DC
  Filled 2015-04-23: qty 1

## 2015-04-23 MED ORDER — EFAVIRENZ 200 MG PO CAPS
600.0000 mg | ORAL_CAPSULE | Freq: Every day | ORAL | Status: DC
  Administered 2015-04-24: 600 mg via ORAL
  Filled 2015-04-23 (×2): qty 3

## 2015-04-23 MED ORDER — HYDROMORPHONE HCL 1 MG/ML IJ SOLN
1.0000 mg | Freq: Once | INTRAMUSCULAR | Status: AC
  Administered 2015-04-23: 1 mg via INTRAVENOUS
  Filled 2015-04-23: qty 1

## 2015-04-23 MED ORDER — HEPARIN SODIUM (PORCINE) 1000 UNIT/ML DIALYSIS
1000.0000 [IU] | INTRAMUSCULAR | Status: DC | PRN
  Filled 2015-04-23: qty 1

## 2015-04-23 MED ORDER — PENTAFLUOROPROP-TETRAFLUOROETH EX AERO: 1.0000 "application " | INHALATION_SPRAY | CUTANEOUS | Status: DC | PRN

## 2015-04-23 MED ORDER — CALCIUM GLUCONATE 10 % IV SOLN
1.0000 g | Freq: Once | INTRAVENOUS | Status: AC
  Administered 2015-04-23: 1 g via INTRAVENOUS
  Filled 2015-04-23: qty 10

## 2015-04-23 MED ORDER — EFAVIRENZ 600 MG PO TABS
600.0000 mg | ORAL_TABLET | Freq: Every day | ORAL | Status: DC
  Filled 2015-04-23: qty 1

## 2015-04-23 MED ORDER — ZOLPIDEM TARTRATE 5 MG PO TABS: 5.0000 mg | ORAL_TABLET | Freq: Every evening | ORAL | Status: DC | PRN

## 2015-04-23 MED ORDER — ZIDOVUDINE 100 MG PO CAPS
300.0000 mg | ORAL_CAPSULE | Freq: Every day | ORAL | Status: DC
  Administered 2015-04-24: 300 mg via ORAL
  Filled 2015-04-23 (×2): qty 3

## 2015-04-23 MED ORDER — INSULIN ASPART 100 UNIT/ML ~~LOC~~ SOLN
10.0000 [IU] | Freq: Once | SUBCUTANEOUS | Status: AC
  Administered 2015-04-23: 10 [IU] via INTRAVENOUS
  Filled 2015-04-23: qty 1

## 2015-04-23 MED ORDER — ALTEPLASE 2 MG IJ SOLR
2.0000 mg | Freq: Once | INTRAMUSCULAR | Status: AC | PRN
  Filled 2015-04-23: qty 2

## 2015-04-23 MED ORDER — CALCIUM ACETATE 667 MG PO CAPS: 667.0000 mg | ORAL_CAPSULE | Freq: Three times a day (TID) | ORAL | Status: DC

## 2015-04-23 MED ORDER — CALCIUM ACETATE (PHOS BINDER) 667 MG PO CAPS
667.0000 mg | ORAL_CAPSULE | ORAL | Status: DC | PRN
  Filled 2015-04-23: qty 1

## 2015-04-23 MED ORDER — WARFARIN SODIUM 10 MG PO TABS: 10.0000 mg | ORAL_TABLET | Freq: Every day | ORAL | Status: DC

## 2015-04-23 MED ORDER — LAMIVUDINE 100 MG PO TABS: 50.0000 mg | ORAL_TABLET | Freq: Every day | ORAL | Status: DC

## 2015-04-23 MED ORDER — ABACAVIR SULFATE 300 MG PO TABS
300.0000 mg | ORAL_TABLET | Freq: Two times a day (BID) | ORAL | Status: DC
  Filled 2015-04-23: qty 1

## 2015-04-23 MED ORDER — LACTULOSE 10 GM/15ML PO SOLN
15.0000 g | Freq: Every day | ORAL | Status: DC | PRN
  Filled 2015-04-23: qty 30

## 2015-04-23 MED ORDER — LIDOCAINE-PRILOCAINE 2.5-2.5 % EX CREA: 1.0000 "application " | TOPICAL_CREAM | CUTANEOUS | Status: DC | PRN

## 2015-04-23 MED ORDER — LAMIVUDINE 10 MG/ML PO SOLN
50.0000 mg | Freq: Every day | ORAL | Status: DC
  Filled 2015-04-23: qty 5

## 2015-04-23 MED ORDER — SEVELAMER CARBONATE 2.4 G PO PACK
2.4000 g | PACK | Freq: Three times a day (TID) | ORAL | Status: DC
  Filled 2015-04-23 (×4): qty 1

## 2015-04-23 MED ORDER — NICOTINE 21 MG/24HR TD PT24
21.0000 mg | MEDICATED_PATCH | Freq: Every day | TRANSDERMAL | Status: DC
  Filled 2015-04-23: qty 1

## 2015-04-23 MED ORDER — CALCIUM ACETATE (PHOS BINDER) 667 MG PO CAPS
1334.0000 mg | ORAL_CAPSULE | Freq: Three times a day (TID) | ORAL | Status: DC
  Filled 2015-04-23 (×4): qty 2

## 2015-04-23 MED ORDER — WARFARIN - PHARMACIST DOSING INPATIENT: Freq: Every day | Status: DC

## 2015-04-23 NOTE — ED Notes (Signed)
CRITICAL VALUE ALERT  Critical value received:  Potasium 6.8  Date of notification:  04/23/2015

## 2015-04-23 NOTE — H&P (Signed)
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 that are not significantly peaked. Patient is status post Kayexalate 30 g, calcium gluconate, insulin 10 units, and 1 amp of dextrose in the emergency department. Patient was unable to tolerate his Kayexalate. -Recheck basic metabolic panel now -Attempt repeat Kayexalate now -CK  Status post fall: Secondary to severe intoxication. Patient with superficial contusions though no acute findings on head, facial, cervical spine CTs. No focal neurological deficits. No signs of persistent bleeding in the setting of anticoagulation with warfarin.  HIV: Well-controlled. Last CD4 count from January 2016 at 750 with a undetectable viral load. -Continue home abacavir, lamivudine, and zidovudine.  Hypertension: At home, patient is on lisinopril 2.5 mg daily. Patient's last blood pressure measurement normotensive at 129/80. -Hold home lisinopril.  Thoracic aortic aneurysm: Patient followed by cardiology for a stable 4 cm ascending aortic aneurysm.  Last assessed on echocardiogram from September 2015.  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 -Continue warfarin per pharmacy  Macrocytic anemia: Admission hemoglobin of 10.9 with an MCV of 102.2. Likely secondary alcohol abuse. -Administer thiamine and folate  Alcohol abuse: -Urine drug screen and ethanol pending -CIWA protocol  Depression/anxiety: -Continue home Klonopin -Continue home trazodone -Continue home Ambien  Systolic heart failure: Last echocardiogram from September 2015 showing a depressed ejection fraction of 20-25% with diffuse hypokinesis and grade 1 diastolic dysfunction. No signs of decompensation on this admission. -Hold home lisinopril  Diet: Regular Prophylaxis: on warfarin full anticoagulation and therapuetic Code: Full  Dispo: Disposition is deferred at this time, awaiting improvement of current medical problems. Anticipated discharge in approximately 1 day(s).   The patient does have a current PCP Jerrye Noble, MD) and does need an [facility] hospital follow-up appointment after discharge.  The patient does not have transportation limitations that hinder transportation to clinic appointments.  Signed: Luan Moore, M.D.,  Ph.D. Internal Medicine Teaching Service, PGY-1 04/24/2015, 12:37 AM
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
## 2015-04-23 NOTE — ED Notes (Signed)
Nephrology at the bedside.

## 2015-04-23 NOTE — ED Notes (Signed)
Nephrology at bedside
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
## 2015-04-24 DIAGNOSIS — F10129 Alcohol abuse with intoxication, unspecified: Secondary | ICD-10-CM | POA: Diagnosis not present

## 2015-04-24 DIAGNOSIS — R000 Tachycardia, unspecified: Secondary | ICD-10-CM

## 2015-04-24 DIAGNOSIS — R05 Cough: Secondary | ICD-10-CM | POA: Diagnosis not present

## 2015-04-24 DIAGNOSIS — F329 Major depressive disorder, single episode, unspecified: Secondary | ICD-10-CM

## 2015-04-24 DIAGNOSIS — Z7901 Long term (current) use of anticoagulants: Secondary | ICD-10-CM | POA: Diagnosis not present

## 2015-04-24 DIAGNOSIS — Z21 Asymptomatic human immunodeficiency virus [HIV] infection status: Secondary | ICD-10-CM

## 2015-04-24 DIAGNOSIS — I120 Hypertensive chronic kidney disease with stage 5 chronic kidney disease or end stage renal disease: Secondary | ICD-10-CM | POA: Diagnosis not present

## 2015-04-24 DIAGNOSIS — E875 Hyperkalemia: Secondary | ICD-10-CM | POA: Diagnosis not present

## 2015-04-24 DIAGNOSIS — N186 End stage renal disease: Secondary | ICD-10-CM

## 2015-04-24 DIAGNOSIS — Z86711 Personal history of pulmonary embolism: Secondary | ICD-10-CM

## 2015-04-24 DIAGNOSIS — Z992 Dependence on renal dialysis: Secondary | ICD-10-CM

## 2015-04-24 DIAGNOSIS — W19XXXA Unspecified fall, initial encounter: Secondary | ICD-10-CM

## 2015-04-24 DIAGNOSIS — S0181XA Laceration without foreign body of other part of head, initial encounter: Secondary | ICD-10-CM | POA: Diagnosis not present

## 2015-04-24 DIAGNOSIS — I5022 Chronic systolic (congestive) heart failure: Secondary | ICD-10-CM

## 2015-04-24 DIAGNOSIS — F1010 Alcohol abuse, uncomplicated: Secondary | ICD-10-CM

## 2015-04-24 DIAGNOSIS — I712 Thoracic aortic aneurysm, without rupture: Secondary | ICD-10-CM

## 2015-04-24 DIAGNOSIS — D649 Anemia, unspecified: Secondary | ICD-10-CM

## 2015-04-24 LAB — URINALYSIS, ROUTINE W REFLEX MICROSCOPIC
Bilirubin Urine: NEGATIVE
Glucose, UA: 100 mg/dL — AB
KETONES UR: NEGATIVE mg/dL
LEUKOCYTES UA: NEGATIVE
NITRITE: NEGATIVE
PH: 8 (ref 5.0–8.0)
Protein, ur: 100 mg/dL — AB
Specific Gravity, Urine: 1.01 (ref 1.005–1.030)
Urobilinogen, UA: 0.2 mg/dL (ref 0.0–1.0)

## 2015-04-24 LAB — CBC
HCT: 29.8 % — ABNORMAL LOW (ref 39.0–52.0)
Hemoglobin: 9.8 g/dL — ABNORMAL LOW (ref 13.0–17.0)
MCH: 33.9 pg (ref 26.0–34.0)
MCHC: 32.9 g/dL (ref 30.0–36.0)
MCV: 103.1 fL — ABNORMAL HIGH (ref 78.0–100.0)
Platelets: 139 10*3/uL — ABNORMAL LOW (ref 150–400)
RBC: 2.89 MIL/uL — ABNORMAL LOW (ref 4.22–5.81)
RDW: 16 % — AB (ref 11.5–15.5)
WBC: 4.6 10*3/uL (ref 4.0–10.5)

## 2015-04-24 LAB — RAPID URINE DRUG SCREEN, HOSP PERFORMED
Amphetamines: NOT DETECTED
Barbiturates: NOT DETECTED
Benzodiazepines: NOT DETECTED
COCAINE: NOT DETECTED
Opiates: NOT DETECTED
Tetrahydrocannabinol: NOT DETECTED

## 2015-04-24 LAB — RENAL FUNCTION PANEL
Albumin: 2.9 g/dL — ABNORMAL LOW (ref 3.5–5.0)
Anion gap: 22 — ABNORMAL HIGH (ref 5–15)
BUN: 98 mg/dL — ABNORMAL HIGH (ref 6–20)
CALCIUM: 8.5 mg/dL — AB (ref 8.9–10.3)
CO2: 22 mmol/L (ref 22–32)
CREATININE: 9.95 mg/dL — AB (ref 0.61–1.24)
Chloride: 92 mmol/L — ABNORMAL LOW (ref 101–111)
GFR calc Af Amer: 7 mL/min — ABNORMAL LOW (ref >60)
GFR, EST NON AFRICAN AMERICAN: 6 mL/min — AB (ref >60)
Glucose, Bld: 97 mg/dL (ref 70–99)
Phosphorus: 13.2 mg/dL — ABNORMAL HIGH (ref 2.5–4.6)
Potassium: 5.9 mmol/L — ABNORMAL HIGH (ref 3.5–5.1)
SODIUM: 136 mmol/L (ref 135–145)

## 2015-04-24 LAB — PHOSPHORUS: Phosphorus: 11.7 mg/dL — ABNORMAL HIGH (ref 2.5–4.6)

## 2015-04-24 LAB — TSH: TSH: 1.71 u[IU]/mL (ref 0.350–4.500)

## 2015-04-24 LAB — BASIC METABOLIC PANEL
Anion gap: 24 — ABNORMAL HIGH (ref 5–15)
BUN: 94 mg/dL — AB (ref 6–20)
CALCIUM: 8.8 mg/dL — AB (ref 8.9–10.3)
CO2: 20 mmol/L — AB (ref 22–32)
Chloride: 92 mmol/L — ABNORMAL LOW (ref 101–111)
Creatinine, Ser: 9.73 mg/dL — ABNORMAL HIGH (ref 0.61–1.24)
GFR calc Af Amer: 7 mL/min — ABNORMAL LOW (ref >60)
GFR, EST NON AFRICAN AMERICAN: 6 mL/min — AB (ref >60)
Glucose, Bld: 81 mg/dL (ref 70–99)
Potassium: 6 mmol/L — ABNORMAL HIGH (ref 3.5–5.1)
Sodium: 136 mmol/L (ref 135–145)

## 2015-04-24 LAB — MAGNESIUM: MAGNESIUM: 2.3 mg/dL (ref 1.7–2.4)

## 2015-04-24 LAB — URINE MICROSCOPIC-ADD ON

## 2015-04-24 LAB — PROTIME-INR
INR: 3.3 — ABNORMAL HIGH (ref 0.00–1.49)
PROTHROMBIN TIME: 33.8 s — AB (ref 11.6–15.2)

## 2015-04-24 LAB — CK: Total CK: 298 U/L (ref 49–397)

## 2015-04-24 LAB — MRSA PCR SCREENING: MRSA BY PCR: NEGATIVE

## 2015-04-24 LAB — POTASSIUM: POTASSIUM: 4.5 mmol/L (ref 3.5–5.1)

## 2015-04-24 LAB — GLUCOSE, CAPILLARY: Glucose-Capillary: 104 mg/dL — ABNORMAL HIGH (ref 70–99)

## 2015-04-24 LAB — ETHANOL: Alcohol, Ethyl (B): <5 mg/dL (ref <5)

## 2015-04-24 MED ORDER — ALBUTEROL SULFATE (2.5 MG/3ML) 0.083% IN NEBU: 2.5000 mg | INHALATION_SOLUTION | Freq: Three times a day (TID) | RESPIRATORY_TRACT | Status: DC

## 2015-04-24 MED ORDER — WARFARIN - PHARMACIST DOSING INPATIENT: Freq: Every day | Status: DC

## 2015-04-24 MED ORDER — WARFARIN SODIUM 10 MG PO TABS
10.0000 mg | ORAL_TABLET | Freq: Every day | ORAL | Status: DC
  Filled 2015-04-24: qty 1

## 2015-04-24 MED ORDER — ABACAVIR SULFATE 300 MG PO TABS
600.0000 mg | ORAL_TABLET | Freq: Every day | ORAL | Status: DC
  Administered 2015-04-24: 600 mg via ORAL
  Filled 2015-04-24 (×2): qty 2

## 2015-04-24 MED ORDER — OXYCODONE HCL 5 MG PO TABS
5.0000 mg | ORAL_TABLET | ORAL | Status: DC | PRN
  Administered 2015-04-24 (×3): 5 mg via ORAL
  Filled 2015-04-24 (×3): qty 1

## 2015-04-24 MED ORDER — SODIUM POLYSTYRENE SULFONATE 15 GM/60ML PO SUSP
15.0000 g | Freq: Once | ORAL | Status: AC
  Administered 2015-04-24: 15 g via ORAL
  Filled 2015-04-24: qty 60

## 2015-04-24 MED ORDER — ALBUTEROL SULFATE (2.5 MG/3ML) 0.083% IN NEBU
3.0000 mL | INHALATION_SOLUTION | RESPIRATORY_TRACT | Status: DC | PRN
  Filled 2015-04-24: qty 3

## 2015-04-24 MED ORDER — WARFARIN SODIUM 10 MG PO TABS: 10.0000 mg | ORAL_TABLET | Freq: Every day | ORAL | Status: DC

## 2015-04-24 NOTE — Discharge Instructions (Signed)
Thank you for trusting [place] with your medical care!  You were hospitalized for hyperkalemia [high potassium] and treated with dialysis.   To make sure you are getting better, please make it to the follow-up appointments listed on the first page.  If you have any questions, please call [phone].
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
## 2015-04-24 NOTE — Progress Notes (Signed)
Patient alert and oriented times three. No UOP at this time. VS stable HR 121. Will continue to monitor.

## 2015-04-24 NOTE — Discharge Summary (Signed)
Name: Brian [name] MRN: [id number] DOB: 10-23-1976 39 y.o. PCP: Jerrye Noble, MD  Date of Admission: 04/23/2015  5:51 PM Date of Discharge: 04/24/2015 Attending Physician: [name], MD  Discharge Diagnosis: Status post fall in the setting of severe alcohol intoxication Hyperkalemia End-stage renal disease Well-controlled HIV Hypertension Thoracic aortic aneurysm History of pulmonary embolism on chronic anticoagulation Macrocytic anemia Alcohol abuse Depression/anxiety Chronic systolic heart failure  Discharge Medications:   Medication List    TAKE these medications        abacavir 300 MG tablet  Commonly known as:  ZIAGEN  TAKE 1 TABLET BY MOUTH 2 TIMES DAILY.     calcium acetate 667 MG capsule  Commonly known as:  PHOSLO  Take 667-1,334 mg by mouth 3 (three) times daily with meals. *takes 2 capsules with every meal and 1 capsule with snacks     clonazePAM 1 MG tablet  Commonly known as:  KLONOPIN  Take 1 mg by mouth at bedtime.     docusate sodium 100 MG capsule  Commonly known as:  COLACE  Take 100 mg by mouth 2 (two) times daily.     lactulose 10 GM/15ML solution  Commonly known as:  CHRONULAC  Take 15 g by mouth daily as needed for mild constipation.     lamivudine 100 MG tablet  Commonly known as:  EPIVIR  TAKE 1/2  TABLET BY MOUTH ONCE A DAY     lisinopril 2.5 MG tablet  Commonly known as:  PRINIVIL,ZESTRIL  TAKE 1 TABLET  BY MOUTH DAILY.     multivitamin Tabs tablet  Take 1 tablet by mouth at bedtime.     nicotine 21 mg/24hr patch  Commonly known as:  NICODERM CQ - dosed in mg/24 hours  Place 1 patch onto the skin.     oxyCODONE-acetaminophen 10-325 MG per tablet  Commonly known as:  PERCOCET  Take 1 tablet by mouth every 4 (four) hours as needed for pain.     PROAIR HFA 108 (90 BASE) MCG/ACT inhaler  Generic drug:  albuterol  INHALE 2 PUFFS EVERY 4  HRS AS NEEDED FOR WHEEZING SHORTNESS OF BREATH     RENVELA 2.4 G Pack  Generic  drug:  sevelamer carbonate  Take 2.4 g by mouth 3 (three) times daily with meals.     sildenafil 25 MG tablet  Commonly known as:  VIAGRA  Take 25 mg by mouth daily as needed for erectile dysfunction.     SUSTIVA 600 MG tablet  Generic drug:  efavirenz  TAKE 1 TABLET AT BEDTIME WITH WITH AZT AND 3TC     traZODone 50 MG tablet  Commonly known as:  DESYREL  Take 50 mg by mouth at bedtime.     warfarin 5 MG tablet  Commonly known as:  COUMADIN  TAKE 2 TABS ON SUN,TUES, WED, FRI & SAT. TAKE 1 & 1/2 TABS ON MON &THURS.     zidovudine 300 MG tablet  Commonly known as:  RETROVIR  Take 300 mg by mouth at bedtime.     zolpidem 5 MG tablet  Commonly known as:  AMBIEN  TAKE 1 TABLET BY MOUTH AT BEDTIME AS NEEDED FOR SLEEP        Disposition and follow-up:   [name] was discharged from [name] in Stable condition.  At the [name] follow up visit please address:  Moderate ascites: check LFTs to assess hepatic function Polysubstance abuse  Follow-up Appointments: Follow-up Information  Follow up with Brian Gallant, MD. Go on 04/28/2015.   Specialty:  Internal Medicine   Why:  345PM   Contact information:   Brian [name] [place] [address] [phone]       Discharge Instructions: Discharge Instructions    Call MD for:  difficulty breathing, headache or visual disturbances    Complete by:  As directed      Call MD for:  persistant nausea and vomiting    Complete by:  As directed      Call MD for:  redness, tenderness, or signs of infection (pain, swelling, redness, odor or green/yellow discharge around incision site)    Complete by:  As directed      Call MD for:  severe uncontrolled pain    Complete by:  As directed      Call MD for:  temperature >100.4    Complete by:  As directed            Consultations: Treatment Team:  Roney Jaffe, MD  Procedures Performed:  Ct Abdomen Pelvis Wo Contrast  04/23/2015   CLINICAL DATA:  [place]  last night, now with left-sided pain. New onset cough.  EXAM: CT CHEST, ABDOMEN AND PELVIS WITHOUT CONTRAST  TECHNIQUE: Multidetector CT imaging of the chest, abdomen and pelvis was performed following the standard protocol without IV contrast.  COMPARISON:  08/03/13  FINDINGS: CT CHEST FINDINGS  There is moderate airspace consolidation in the left lower lobe with volume loss. There is a small left pleural effusion. There is minimal atelectatic appearing posterior opacity in the right base. Mild paraseptal emphysematous changes are present in the upper lobes. Central airways are patent. Mildly prominent mediastinal and hilar nodes are present, unchanged from 08/03/13. Skeletal structures are intact.  CT ABDOMEN AND PELVIS FINDINGS  Prior left nephrectomy. Markedly enlarged multi cystic right kidney without significant interval change. Multiple hepatic cysts are again evident without significant interval change. Unremarkable unenhanced appearances of the spleen and pancreas. Bowel is remarkable only for mild colonic diverticulosis. There is a small volume peritoneal ascites, slightly greater volume than on 08/03/13.  No acute inflammatory changes are evident in the abdomen or pelvis. There is no evidence of bowel obstruction or extraluminal air. No acute musculoskeletal abnormalities are evident. There is old unchanged anterior wedging of L2, mild.  The abdominal aorta is normal in caliber.  IMPRESSION: 1. No evidence of acute traumatic injury in the chest, abdomen or pelvis. 2. Moderate airspace consolidation in the left lower lobe. This may represent infectious infiltrate, atelectasis, aspiration. Small left pleural effusion 3. Unchanged mildly prominent mediastinal and hilar nodes. 4. Markedly enlarged multi cystic right kidney. Left nephrectomy. Multiple hepatic cysts. Unchanged from 08/03/2013. 5. Small volume peritoneal ascites, slightly increased from 08/03/2013.   Electronically Signed   By: Andreas Newport  M.D.   On: 04/23/2015 21:28   Ct Head Wo Contrast  04/23/2015   CLINICAL DATA:  Alcohol consumption with subsequent fall down stairs. Loss of consciousness. Trauma to the face.  EXAM: CT HEAD WITHOUT CONTRAST  CT MAXILLOFACIAL WITHOUT CONTRAST  CT CERVICAL SPINE WITHOUT CONTRAST  TECHNIQUE: Multidetector CT imaging of the head, cervical spine, and maxillofacial structures were performed using the standard protocol without intravenous contrast. Multiplanar CT image reconstructions of the cervical spine and maxillofacial structures were also generated.  COMPARISON:  06/13/2014  FINDINGS: CT HEAD FINDINGS  The brain shows mild generalized atrophy. There is no evidence of old or acute infarction, mass lesion, hemorrhage, hydrocephalus or  extra-axial collection. No calvarial fracture.  CT MAXILLOFACIAL FINDINGS  No acute facial fracture. There are old healed nasal fractures, maxillary sinus fractures and lateral wall orbit fracture on the right. No traumatic fluid in the sinuses presently.  CT CERVICAL SPINE FINDINGS  Alignment is normal. No fracture. No soft tissue swelling. Ordinary degenerative spondylosis at C5-6 and C6-7 with endplate osteophytes but no severe stenosis of the canal or foramina.  IMPRESSION: Head CT:  No acute or traumatic finding.  Atrophy.  Facial CT:  No acute finding.  Old facial fractures.  Cervical spine CT:  No cervical spine injury.  Chronic spondylosis.   Electronically Signed   By: Nelson Chimes M.D.   On: 04/23/2015 21:19   Ct Chest Wo Contrast  04/23/2015   CLINICAL DATA:  [place] last night, now with left-sided pain. New onset cough.  EXAM: CT CHEST, ABDOMEN AND PELVIS WITHOUT CONTRAST  TECHNIQUE: Multidetector CT imaging of the chest, abdomen and pelvis was performed following the standard protocol without IV contrast.  COMPARISON:  08/03/13  FINDINGS: CT CHEST FINDINGS  There is moderate airspace consolidation in the left lower lobe with volume loss. There is a small left pleural  effusion. There is minimal atelectatic appearing posterior opacity in the right base. Mild paraseptal emphysematous changes are present in the upper lobes. Central airways are patent. Mildly prominent mediastinal and hilar nodes are present, unchanged from 08/03/13. Skeletal structures are intact.  CT ABDOMEN AND PELVIS FINDINGS  Prior left nephrectomy. Markedly enlarged multi cystic right kidney without significant interval change. Multiple hepatic cysts are again evident without significant interval change. Unremarkable unenhanced appearances of the spleen and pancreas. Bowel is remarkable only for mild colonic diverticulosis. There is a small volume peritoneal ascites, slightly greater volume than on 08/03/13.  No acute inflammatory changes are evident in the abdomen or pelvis. There is no evidence of bowel obstruction or extraluminal air. No acute musculoskeletal abnormalities are evident. There is old unchanged anterior wedging of L2, mild.  The abdominal aorta is normal in caliber.  IMPRESSION: 1. No evidence of acute traumatic injury in the chest, abdomen or pelvis. 2. Moderate airspace consolidation in the left lower lobe. This may represent infectious infiltrate, atelectasis, aspiration. Small left pleural effusion 3. Unchanged mildly prominent mediastinal and hilar nodes. 4. Markedly enlarged multi cystic right kidney. Left nephrectomy. Multiple hepatic cysts. Unchanged from 08/03/2013. 5. Small volume peritoneal ascites, slightly increased from 08/03/2013.   Electronically Signed   By: Andreas Newport M.D.   On: 04/23/2015 21:28   Ct Cervical Spine Wo Contrast  04/23/2015   CLINICAL DATA:  Alcohol consumption with subsequent fall down stairs. Loss of consciousness. Trauma to the face.  EXAM: CT HEAD WITHOUT CONTRAST  CT MAXILLOFACIAL WITHOUT CONTRAST  CT CERVICAL SPINE WITHOUT CONTRAST  TECHNIQUE: Multidetector CT imaging of the head, cervical spine, and maxillofacial structures were performed using  the standard protocol without intravenous contrast. Multiplanar CT image reconstructions of the cervical spine and maxillofacial structures were also generated.  COMPARISON:  06/13/2014  FINDINGS: CT HEAD FINDINGS  The brain shows mild generalized atrophy. There is no evidence of old or acute infarction, mass lesion, hemorrhage, hydrocephalus or extra-axial collection. No calvarial fracture.  CT MAXILLOFACIAL FINDINGS  No acute facial fracture. There are old healed nasal fractures, maxillary sinus fractures and lateral wall orbit fracture on the right. No traumatic fluid in the sinuses presently.  CT CERVICAL SPINE FINDINGS  Alignment is normal. No fracture. No soft tissue swelling. Ordinary degenerative  spondylosis at C5-6 and C6-7 with endplate osteophytes but no severe stenosis of the canal or foramina.  IMPRESSION: Head CT:  No acute or traumatic finding.  Atrophy.  Facial CT:  No acute finding.  Old facial fractures.  Cervical spine CT:  No cervical spine injury.  Chronic spondylosis.   Electronically Signed   By: Nelson Chimes M.D.   On: 04/23/2015 21:19   Ct Maxillofacial Wo Cm  04/23/2015   CLINICAL DATA:  Alcohol consumption with subsequent fall down stairs. Loss of consciousness. Trauma to the face.  EXAM: CT HEAD WITHOUT CONTRAST  CT MAXILLOFACIAL WITHOUT CONTRAST  CT CERVICAL SPINE WITHOUT CONTRAST  TECHNIQUE: Multidetector CT imaging of the head, cervical spine, and maxillofacial structures were performed using the standard protocol without intravenous contrast. Multiplanar CT image reconstructions of the cervical spine and maxillofacial structures were also generated.  COMPARISON:  06/13/2014  FINDINGS: CT HEAD FINDINGS  The brain shows mild generalized atrophy. There is no evidence of old or acute infarction, mass lesion, hemorrhage, hydrocephalus or extra-axial collection. No calvarial fracture.  CT MAXILLOFACIAL FINDINGS  No acute facial fracture. There are old healed nasal fractures, maxillary  sinus fractures and lateral wall orbit fracture on the right. No traumatic fluid in the sinuses presently.  CT CERVICAL SPINE FINDINGS  Alignment is normal. No fracture. No soft tissue swelling. Ordinary degenerative spondylosis at C5-6 and C6-7 with endplate osteophytes but no severe stenosis of the canal or foramina.  IMPRESSION: Head CT:  No acute or traumatic finding.  Atrophy.  Facial CT:  No acute finding.  Old facial fractures.  Cervical spine CT:  No cervical spine injury.  Chronic spondylosis.   Electronically Signed   By: Nelson Chimes M.D.   On: 04/23/2015 21:19    Admission HPI: Patient is a 39 year old gentleman with a history of end-stage renal disease on hemodialysis, alcohol abuse, hypertension, HIV (last CD4 750, viral load undetectable), thoracic aortic aneurysm, on chronic anticoagulation for pulmonary embolism who presents to the emergency department status post multiple falls in the setting of alcohol intoxication. Patient states that he only occasionally drinks alcohol. However, the day of presentation, he drank the entire bottle of liquor which precipitated an extended period of drunkenness with multiple falls. States that he remembers struck his head against a wooden table as well as striking his ribs and his knees on various surfaces throughout the night. Family initially called EMS reportedly in the morning but patient had declined to be transported to the [name] for further evaluation. However, he states that once he woke up around 12:30 PM the afternoon, he noticed that his nose and had continued to bleed. He also noted that he had chest chest wall pain with coughing and that his respirations were somewhat inhibited by chest wall pain. Patient states that he has feelings of being hot and cold. He also reports coughing up blood in the setting of a nosebleed. He had one bout of nonbloody nonbilious emesis that he remembers during his episode of intoxication. Otherwise, patient denies  any abdominal pain, dysuria, hematuria, constipation, or diarrhea. He states that he smokes 10 cigarettes a day and does not use any illicit drugs.  In the emergency department, patient received Kayexalate 30 g, calcium gluconate, insulin 10 units, 1 amp of dextrose, and 2 mg of Dilaudid.  [name] Course by problem list:   Status post fall: Secondary to severe alcohol intoxication. Imaging and physical exam findings reassuring, especially given his high risk for bleeding in  the setting of chronic anticoagulation. CK was also reassuring for no rhabdomyolysis. He was advised to limit his alcohol intake to which he acknowledged understanding and resolved to do better.  Hyperkalemia: His potassium trended down with hemodialysis and was 4.5 at the time of discharge.   End-stage renal disease: Received hemodialysis during his [name] stay.  Well-controlled HIV: Stable on her medications.  Hypertension: Lisinopril was held as he was normotensive but resumed at the time of discharge.  Thoracic aortic aneurysm: Noted to be stable in size per admission imaging.  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 He was continued on warfarin with pharmacy recommendations and was asked to resume the same dose at the time of discharge.  Macrocytic anemia: Admission hemoglobin of 10.9 with an MCV of 102.2. He received thiamine and folate on admission.  Alcohol abuse: As noted above. UDS and serum ethanol otherwise unremarkable. Moderate ascites noted on abdominal CT suggestive of liver disease though LFTs were not checked on admission and should be considered at the time of follow-up.   Depression/anxiety: Stable on her medications  Chronic systolic heart failure: Lisinopril as noted above.  Discharge Vitals:   BP 141/85 mmHg  Pulse 104  Temp(Src) 99.8 F (37.7 C) (Oral)  Resp 20  Ht 6\' 3"  (1.905 m)  Wt 178 lb 5.6 oz (80.9 kg)  BMI 22.29 kg/m2  SpO2 97%  Discharge Labs:    Results for orders placed or performed during the [name] encounter of 04/23/15 (from the past 24 hour(s))  Protime-INR     Status: Abnormal   Collection Time: 04/23/15  7:01 PM  Result Value Ref Range   Prothrombin Time 30.0 (H) 11.6 - 15.2 seconds   INR 2.83 (H) 0.00 - 1.49  CBC with Differential     Status: Abnormal   Collection Time: 04/23/15  7:01 PM  Result Value Ref Range   WBC 6.4 4.0 - 10.5 K/uL   RBC 3.16 (L) 4.22 - 5.81 MIL/uL   Hemoglobin 10.9 (L) 13.0 - 17.0 g/dL   HCT 32.3 (L) 39.0 - 52.0 %   MCV 102.2 (H) 78.0 - 100.0 fL   MCH 34.5 (H) 26.0 - 34.0 pg   MCHC 33.7 30.0 - 36.0 g/dL   RDW 15.8 (H) 11.5 - 15.5 %   Platelets 161 150 - 400 K/uL   Neutrophils Relative % 74 43 - 77 %   Neutro Abs 4.7 1.7 - 7.7 K/uL   Lymphocytes Relative 16 12 - 46 %   Lymphs Abs 1.0 0.7 - 4.0 K/uL   Monocytes Relative 10 3 - 12 %   Monocytes Absolute 0.7 0.1 - 1.0 K/uL   Eosinophils Relative 0 0 - 5 %   Eosinophils Absolute 0.0 0.0 - 0.7 K/uL   Basophils Relative 0 0 - 1 %   Basophils Absolute 0.0 0.0 - 0.1 K/uL  Basic metabolic panel     Status: Abnormal   Collection Time: 04/23/15  7:01 PM  Result Value Ref Range   Sodium 134 (L) 135 - 145 mmol/L   Potassium 6.8 (HH) 3.5 - 5.1 mmol/L   Chloride 91 (L) 101 - 111 mmol/L   CO2 23 22 - 32 mmol/L   Glucose, Bld 105 (H) 70 - 99 mg/dL   BUN 91 (H) 6 - 20 mg/dL   Creatinine, Ser 9.10 (H) 0.61 - 1.24 mg/dL   Calcium 8.5 (L) 8.9 - 10.3 mg/dL   GFR calc non Af Amer 6 (L) >60  mL/min   GFR calc Af Amer 7 (L) >60 mL/min   Anion gap 20 (H) 5 - 15  TSH     Status: None   Collection Time: 04/23/15 11:15 PM  Result Value Ref Range   TSH 1.710 0.350 - 4.500 uIU/mL  Ethanol     Status: None   Collection Time: 04/23/15 11:15 PM  Result Value Ref Range   Alcohol, Ethyl (B) <5 <5 mg/dL  Phosphorus     Status: Abnormal   Collection Time: 04/23/15 11:15 PM  Result Value Ref Range   Phosphorus 11.7 (H) 2.5 - 4.6 mg/dL  Magnesium     Status:  None   Collection Time: 04/23/15 11:15 PM  Result Value Ref Range   Magnesium 2.3 1.7 - 2.4 mg/dL  CK     Status: None   Collection Time: 04/23/15 11:15 PM  Result Value Ref Range   Total CK 298 49 - 397 U/L  MRSA PCR Screening     Status: None   Collection Time: 04/23/15 11:17 PM  Result Value Ref Range   MRSA by PCR NEGATIVE NEGATIVE  Basic metabolic panel     Status: Abnormal   Collection Time: 04/24/15 12:54 AM  Result Value Ref Range   Sodium 136 135 - 145 mmol/L   Potassium 6.0 (H) 3.5 - 5.1 mmol/L   Chloride 92 (L) 101 - 111 mmol/L   CO2 20 (L) 22 - 32 mmol/L   Glucose, Bld 81 70 - 99 mg/dL   BUN 94 (H) 6 - 20 mg/dL   Creatinine, Ser 9.73 (H) 0.61 - 1.24 mg/dL   Calcium 8.8 (L) 8.9 - 10.3 mg/dL   GFR calc non Af Amer 6 (L) >60 mL/min   GFR calc Af Amer 7 (L) >60 mL/min   Anion gap 24 (H) 5 - 15  Glucose, capillary     Status: Abnormal   Collection Time: 04/24/15  1:25 AM  Result Value Ref Range   Glucose-Capillary 104 (H) 70 - 99 mg/dL  Renal function panel     Status: Abnormal   Collection Time: 04/24/15  4:29 AM  Result Value Ref Range   Sodium 136 135 - 145 mmol/L   Potassium 5.9 (H) 3.5 - 5.1 mmol/L   Chloride 92 (L) 101 - 111 mmol/L   CO2 22 22 - 32 mmol/L   Glucose, Bld 97 70 - 99 mg/dL   BUN 98 (H) 6 - 20 mg/dL   Creatinine, Ser 9.95 (H) 0.61 - 1.24 mg/dL   Calcium 8.5 (L) 8.9 - 10.3 mg/dL   Phosphorus 13.2 (H) 2.5 - 4.6 mg/dL   Albumin 2.9 (L) 3.5 - 5.0 g/dL   GFR calc non Af Amer 6 (L) >60 mL/min   GFR calc Af Amer 7 (L) >60 mL/min   Anion gap 22 (H) 5 - 15  Protime-INR     Status: Abnormal   Collection Time: 04/24/15  4:29 AM  Result Value Ref Range   Prothrombin Time 33.8 (H) 11.6 - 15.2 seconds   INR 3.30 (H) 0.00 - 1.49  Urine rapid drug screen (hosp performed)     Status: None   Collection Time: 04/24/15  6:06 AM  Result Value Ref Range   Opiates NONE DETECTED NONE DETECTED   Cocaine NONE DETECTED NONE DETECTED   Benzodiazepines NONE  DETECTED NONE DETECTED   Amphetamines NONE DETECTED NONE DETECTED   Tetrahydrocannabinol NONE DETECTED NONE DETECTED   Barbiturates NONE DETECTED NONE DETECTED  Urinalysis, Routine  w reflex microscopic     Status: Abnormal   Collection Time: 04/24/15  6:06 AM  Result Value Ref Range   Color, Urine YELLOW YELLOW   APPearance CLEAR CLEAR   Specific Gravity, Urine 1.010 1.005 - 1.030   pH 8.0 5.0 - 8.0   Glucose, UA 100 (A) NEGATIVE mg/dL   Hgb urine dipstick MODERATE (A) NEGATIVE   Bilirubin Urine NEGATIVE NEGATIVE   Ketones, ur NEGATIVE NEGATIVE mg/dL   Protein, ur 100 (A) NEGATIVE mg/dL   Urobilinogen, UA 0.2 0.0 - 1.0 mg/dL   Nitrite NEGATIVE NEGATIVE   Leukocytes, UA NEGATIVE NEGATIVE  Urine microscopic-add on     Status: None   Collection Time: 04/24/15  6:06 AM  Result Value Ref Range   Squamous Epithelial / LPF RARE RARE   RBC / HPF 3-6 <3 RBC/hpf  CBC     Status: Abnormal   Collection Time: 04/24/15  7:40 AM  Result Value Ref Range   WBC 4.6 4.0 - 10.5 K/uL   RBC 2.89 (L) 4.22 - 5.81 MIL/uL   Hemoglobin 9.8 (L) 13.0 - 17.0 g/dL   HCT 29.8 (L) 39.0 - 52.0 %   MCV 103.1 (H) 78.0 - 100.0 fL   MCH 33.9 26.0 - 34.0 pg   MCHC 32.9 30.0 - 36.0 g/dL   RDW 16.0 (H) 11.5 - 15.5 %   Platelets 139 (L) 150 - 400 K/uL  Potassium     Status: None   Collection Time: 04/24/15  3:50 PM  Result Value Ref Range   Potassium 4.5 3.5 - 5.1 mmol/L    Signed: Riccardo Dubin, MD 04/25/2015, 4:12 PM    Services Ordered on Discharge: None Equipment Ordered on Discharge: None

## 2015-04-24 NOTE — Progress Notes (Addendum)
Subjective: This morning, he was receiving dialysis on exam. He reports that to commemorate Mother's Day, his girlfriend was imbibing alcoholic drinks and felt compelled to join her. He regrets making that decision given the outcome and results to do better next time. I explained to him that that would be in his best interest with all that he has going on for himself right now to avoid developing additional complications to which she acknowledged agreement. He denies any recent weight lost or poor appetite. His free time, he enjoys playing the guitar, suntanning, playing video games. He reports being diagnosed with HIV 5 years ago and has been adherent to his medications, and his male partner is aware of this infection. He denies any illicit drug use or high-risk sexual activity.  Objective: Vital signs in last 24 hours: Filed Vitals:   04/24/15 0900 04/24/15 0930 04/24/15 0955 04/24/15 1049  BP: 117/78 123/79 132/87 141/85  Pulse: 108 104 102 104  Temp:   98.1 F (36.7 C) 99.8 F (37.7 C)  TempSrc:   Oral Oral  Resp:   18 20  Height:      Weight:      SpO2:   95% 97%   Weight change:   Intake/Output Summary (Last 24 hours) at 04/24/15 1126 Last data filed at 04/24/15 0955  Gross per 24 hour  Intake    478 ml  Output   1706 ml  Net  -1228 ml   General: Tan appearing male resting in bed, NAD HEENT: PERRL, EOMI, sunken cheeks cheeks, mild scleral icterus, oropharynx clear Cardiac: Tachycardic, no rubs, murmurs or gallops Pulm: clear to auscultation bilaterally in the anterior lung fields, no wheezes, rales, or rhonchi Abd: Surgical scar are noted in the left lower quadrant from prior nephrectomy, tattoo overlying right lower quadrant ["I Love Sex"], soft, nontender, mildly distended, BS present, dull to percussion all throughout Ext: warm and well perfused, wearing jeans, AV fistula in right upper extremity Neuro: responds to questions appropriately; moving all extremities  freely, tracks movement appropriately with eyes  Lab Results: Basic Metabolic Panel:  Recent Labs Lab 04/23/15 2315 04/24/15 0054 04/24/15 0429  NA  --  136 136  K  --  6.0* 5.9*  CL  --  92* 92*  CO2  --  20* 22  GLUCOSE  --  81 97  BUN  --  94* 98*  CREATININE  --  9.73* 9.95*  CALCIUM  --  8.8* 8.5*  MG 2.3  --   --   PHOS 11.7*  --  13.2*   Liver Function Tests:  Recent Labs Lab 04/24/15 0429  ALBUMIN 2.9*   CBC:  Recent Labs Lab 04/23/15 1901 04/24/15 0740  WBC 6.4 4.6  NEUTROABS 4.7  --   HGB 10.9* 9.8*  HCT 32.3* 29.8*  MCV 102.2* 103.1*  PLT 161 139*   Cardiac Enzymes:  Recent Labs Lab 04/23/15 2315  CKTOTAL 298   CBG:  Recent Labs Lab 04/24/15 0125  GLUCAP 104*   Thyroid Function Tests:  Recent Labs Lab 04/23/15 2315  TSH 1.710   Coagulation:  Recent Labs Lab 04/23/15 1901 04/24/15 0429  LABPROT 30.0* 33.8*  INR 2.83* 3.30*   Drugs of Abuse     Component Value Date/Time   LABOPIA NONE DETECTED 04/24/2015 0606   LABOPIA NEG 03/26/2013 1519   COCAINSCRNUR NONE DETECTED 04/24/2015 0606   COCAINSCRNUR NEG 03/26/2013 1519   LABBENZ NONE DETECTED 04/24/2015 0606   LABBENZ NEG 03/26/2013  1519   LABBENZ NEG 07/30/2011 1656   AMPHETMU NONE DETECTED 04/24/2015 0606   AMPHETMU NEG 03/26/2013 1519   AMPHETMU NEG 07/30/2011 1656   THCU NONE DETECTED 04/24/2015 0606   THCU PPS 03/26/2013 1519   THCU NEG 03/26/2013 1519   LABBARB NONE DETECTED 04/24/2015 0606   LABBARB NEG 03/26/2013 1519    Alcohol Level:  Recent Labs Lab 04/23/15 2315  ETH <5   Urinalysis:  Recent Labs Lab 04/24/15 0606  COLORURINE YELLOW  LABSPEC 1.010  PHURINE 8.0  GLUCOSEU 100*  HGBUR MODERATE*  BILIRUBINUR NEGATIVE  KETONESUR NEGATIVE  PROTEINUR 100*  UROBILINOGEN 0.2  NITRITE NEGATIVE  LEUKOCYTESUR NEGATIVE     Micro Results: Recent Results (from the past 240 hour(s))  MRSA PCR Screening     Status: None   Collection Time: 04/23/15  11:17 PM  Result Value Ref Range Status   MRSA by PCR NEGATIVE NEGATIVE Final    Comment:        The GeneXpert MRSA Assay (FDA approved for NASAL specimens only), is one component of a comprehensive MRSA colonization surveillance program. It is not intended to diagnose MRSA infection nor to guide or monitor treatment for MRSA infections.    Studies/Results: Ct Abdomen Pelvis Wo Contrast  04/23/2015   CLINICAL DATA:  [place] last night, now with left-sided pain. New onset cough.  EXAM: CT CHEST, ABDOMEN AND PELVIS WITHOUT CONTRAST  TECHNIQUE: Multidetector CT imaging of the chest, abdomen and pelvis was performed following the standard protocol without IV contrast.  COMPARISON:  08/03/13  FINDINGS: CT CHEST FINDINGS  There is moderate airspace consolidation in the left lower lobe with volume loss. There is a small left pleural effusion. There is minimal atelectatic appearing posterior opacity in the right base. Mild paraseptal emphysematous changes are present in the upper lobes. Central airways are patent. Mildly prominent mediastinal and hilar nodes are present, unchanged from 08/03/13. Skeletal structures are intact.  CT ABDOMEN AND PELVIS FINDINGS  Prior left nephrectomy. Markedly enlarged multi cystic right kidney without significant interval change. Multiple hepatic cysts are again evident without significant interval change. Unremarkable unenhanced appearances of the spleen and pancreas. Bowel is remarkable only for mild colonic diverticulosis. There is a small volume peritoneal ascites, slightly greater volume than on 08/03/13.  No acute inflammatory changes are evident in the abdomen or pelvis. There is no evidence of bowel obstruction or extraluminal air. No acute musculoskeletal abnormalities are evident. There is old unchanged anterior wedging of L2, mild.  The abdominal aorta is normal in caliber.  IMPRESSION: 1. No evidence of acute traumatic injury in the chest, abdomen or pelvis. 2.  Moderate airspace consolidation in the left lower lobe. This may represent infectious infiltrate, atelectasis, aspiration. Small left pleural effusion 3. Unchanged mildly prominent mediastinal and hilar nodes. 4. Markedly enlarged multi cystic right kidney. Left nephrectomy. Multiple hepatic cysts. Unchanged from 08/03/2013. 5. Small volume peritoneal ascites, slightly increased from 08/03/2013.   Electronically Signed   By: Andreas Newport M.D.   On: 04/23/2015 21:28   Ct Head Wo Contrast  04/23/2015   CLINICAL DATA:  Alcohol consumption with subsequent fall down stairs. Loss of consciousness. Trauma to the face.  EXAM: CT HEAD WITHOUT CONTRAST  CT MAXILLOFACIAL WITHOUT CONTRAST  CT CERVICAL SPINE WITHOUT CONTRAST  TECHNIQUE: Multidetector CT imaging of the head, cervical spine, and maxillofacial structures were performed using the standard protocol without intravenous contrast. Multiplanar CT image reconstructions of the cervical spine and maxillofacial structures were also generated.  COMPARISON:  06/13/2014  FINDINGS: CT HEAD FINDINGS  The brain shows mild generalized atrophy. There is no evidence of old or acute infarction, mass lesion, hemorrhage, hydrocephalus or extra-axial collection. No calvarial fracture.  CT MAXILLOFACIAL FINDINGS  No acute facial fracture. There are old healed nasal fractures, maxillary sinus fractures and lateral wall orbit fracture on the right. No traumatic fluid in the sinuses presently.  CT CERVICAL SPINE FINDINGS  Alignment is normal. No fracture. No soft tissue swelling. Ordinary degenerative spondylosis at C5-6 and C6-7 with endplate osteophytes but no severe stenosis of the canal or foramina.  IMPRESSION: Head CT:  No acute or traumatic finding.  Atrophy.  Facial CT:  No acute finding.  Old facial fractures.  Cervical spine CT:  No cervical spine injury.  Chronic spondylosis.   Electronically Signed   By: Nelson Chimes M.D.   On: 04/23/2015 21:19   Ct Chest Wo  Contrast  04/23/2015   CLINICAL DATA:  [place] last night, now with left-sided pain. New onset cough.  EXAM: CT CHEST, ABDOMEN AND PELVIS WITHOUT CONTRAST  TECHNIQUE: Multidetector CT imaging of the chest, abdomen and pelvis was performed following the standard protocol without IV contrast.  COMPARISON:  08/03/13  FINDINGS: CT CHEST FINDINGS  There is moderate airspace consolidation in the left lower lobe with volume loss. There is a small left pleural effusion. There is minimal atelectatic appearing posterior opacity in the right base. Mild paraseptal emphysematous changes are present in the upper lobes. Central airways are patent. Mildly prominent mediastinal and hilar nodes are present, unchanged from 08/03/13. Skeletal structures are intact.  CT ABDOMEN AND PELVIS FINDINGS  Prior left nephrectomy. Markedly enlarged multi cystic right kidney without significant interval change. Multiple hepatic cysts are again evident without significant interval change. Unremarkable unenhanced appearances of the spleen and pancreas. Bowel is remarkable only for mild colonic diverticulosis. There is a small volume peritoneal ascites, slightly greater volume than on 08/03/13.  No acute inflammatory changes are evident in the abdomen or pelvis. There is no evidence of bowel obstruction or extraluminal air. No acute musculoskeletal abnormalities are evident. There is old unchanged anterior wedging of L2, mild.  The abdominal aorta is normal in caliber.  IMPRESSION: 1. No evidence of acute traumatic injury in the chest, abdomen or pelvis. 2. Moderate airspace consolidation in the left lower lobe. This may represent infectious infiltrate, atelectasis, aspiration. Small left pleural effusion 3. Unchanged mildly prominent mediastinal and hilar nodes. 4. Markedly enlarged multi cystic right kidney. Left nephrectomy. Multiple hepatic cysts. Unchanged from 08/03/2013. 5. Small volume peritoneal ascites, slightly increased from 08/03/2013.    Electronically Signed   By: Andreas Newport M.D.   On: 04/23/2015 21:28   Ct Cervical Spine Wo Contrast  04/23/2015   CLINICAL DATA:  Alcohol consumption with subsequent fall down stairs. Loss of consciousness. Trauma to the face.  EXAM: CT HEAD WITHOUT CONTRAST  CT MAXILLOFACIAL WITHOUT CONTRAST  CT CERVICAL SPINE WITHOUT CONTRAST  TECHNIQUE: Multidetector CT imaging of the head, cervical spine, and maxillofacial structures were performed using the standard protocol without intravenous contrast. Multiplanar CT image reconstructions of the cervical spine and maxillofacial structures were also generated.  COMPARISON:  06/13/2014  FINDINGS: CT HEAD FINDINGS  The brain shows mild generalized atrophy. There is no evidence of old or acute infarction, mass lesion, hemorrhage, hydrocephalus or extra-axial collection. No calvarial fracture.  CT MAXILLOFACIAL FINDINGS  No acute facial fracture. There are old healed nasal fractures, maxillary sinus fractures and lateral wall  orbit fracture on the right. No traumatic fluid in the sinuses presently.  CT CERVICAL SPINE FINDINGS  Alignment is normal. No fracture. No soft tissue swelling. Ordinary degenerative spondylosis at C5-6 and C6-7 with endplate osteophytes but no severe stenosis of the canal or foramina.  IMPRESSION: Head CT:  No acute or traumatic finding.  Atrophy.  Facial CT:  No acute finding.  Old facial fractures.  Cervical spine CT:  No cervical spine injury.  Chronic spondylosis.   Electronically Signed   By: Nelson Chimes M.D.   On: 04/23/2015 21:19   Ct Maxillofacial Wo Cm  04/23/2015   CLINICAL DATA:  Alcohol consumption with subsequent fall down stairs. Loss of consciousness. Trauma to the face.  EXAM: CT HEAD WITHOUT CONTRAST  CT MAXILLOFACIAL WITHOUT CONTRAST  CT CERVICAL SPINE WITHOUT CONTRAST  TECHNIQUE: Multidetector CT imaging of the head, cervical spine, and maxillofacial structures were performed using the standard protocol without intravenous  contrast. Multiplanar CT image reconstructions of the cervical spine and maxillofacial structures were also generated.  COMPARISON:  06/13/2014  FINDINGS: CT HEAD FINDINGS  The brain shows mild generalized atrophy. There is no evidence of old or acute infarction, mass lesion, hemorrhage, hydrocephalus or extra-axial collection. No calvarial fracture.  CT MAXILLOFACIAL FINDINGS  No acute facial fracture. There are old healed nasal fractures, maxillary sinus fractures and lateral wall orbit fracture on the right. No traumatic fluid in the sinuses presently.  CT CERVICAL SPINE FINDINGS  Alignment is normal. No fracture. No soft tissue swelling. Ordinary degenerative spondylosis at C5-6 and C6-7 with endplate osteophytes but no severe stenosis of the canal or foramina.  IMPRESSION: Head CT:  No acute or traumatic finding.  Atrophy.  Facial CT:  No acute finding.  Old facial fractures.  Cervical spine CT:  No cervical spine injury.  Chronic spondylosis.   Electronically Signed   By: Nelson Chimes M.D.   On: 04/23/2015 21:19   Medications: I have reviewed the patient's current medications. Scheduled Meds: . abacavir  600 mg Oral QHS  . calcium acetate  1,334 mg Oral TID WC  . clonazePAM  1 mg Oral QHS  . docusate sodium  100 mg Oral BID  . efavirenz  600 mg Oral QHS  . folic acid  1 mg Oral Daily  . lamiVUDine  50 mg Oral Daily  . multivitamin  1 tablet Oral QHS  . nicotine  21 mg Transdermal Daily  . sevelamer carbonate  2.4 g Oral TID WC  . thiamine  100 mg Oral Daily  . traZODone  50 mg Oral QHS  . warfarin  10 mg Oral QHS  . Warfarin - Pharmacist Dosing Inpatient   Does not apply QHS  . zidovudine  300 mg Oral QHS   Continuous Infusions:  PRN Meds:.albuterol, calcium acetate **AND** calcium acetate, lactulose, LORazepam **OR** LORazepam, oxyCODONE, zolpidem Assessment/Plan:  Mr. 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  Status post fall: Secondary to severe alcohol intoxication. Imaging and physical exam findings reassuring, especially given his high risk for bleeding in the setting of chronic anticoagulation. CK reassuring. -Advised patient to limit his alcohol intake  Hyperkalemia: Potassium 5.9, improved from admission. Currently receiving hemodialysis.  -Recheck basic metabolic panel now -Attempt repeat Kayexalate  now -CK  End-stage renal disease: Secondary to autosomal dominant polycystic kidney disease. At home, patient receives Monday, Wednesday, Friday dialysis. Magnesium 2.3 though phosphorus 13.2 her most recent check. -Continue hemodialysis per nephrology -Continue home Renvela  Well-controlled HIV: Last CD4 count from January 2016 at 750 with a undetectable viral load. -Continue home abacavir, lamivudine, and zidovudine.  Hypertension: At home, patient is on lisinopril 2.5 mg daily. Systolic blood pressures mostly trending 120s to 140s. -Hold home lisinopril.  Thoracic aortic aneurysm: Patient followed by cardiology for a stable 4 cm ascending aortic aneurysm. Last assessed on echocardiogram from September 2015.  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 -Continue warfarin per pharmacy  Macrocytic anemia: Admission hemoglobin of 10.9 with an MCV of 102.2. Likely secondary alcohol abuse. He received thiamine and folate on admission. -Continue following  Alcohol abuse: He acknowledges his excessive use though require reassessment at the time of follow-up visit. UDS and ethanol otherwise reassuring and consistent with his denial of prior illicit drug use. Moderate ascites noted on abdominal CT suggestive of liver  disease though LFTs were not checked on admission and should be considered at the time of follow-up.  -Continue CIWA protocol  Depression/anxiety: Continue home Klonopin, trazodone, Ambien.  Systolic heart failure: EF 20-25% with diffuse hypokinesis and grade 1 diastolic dysfunction per echo September 2015. No signs of decompensation on exam. -Lisinopril as noted above  #FEN:  -Diet: Renal  #DVT prophylaxis: Heparin  #CODE STATUS: FULL CODE   Dispo: Disposition is deferred at this time, awaiting improvement of current medical problems, though possibly home today or tomorrow.    The patient does have a current PCP Jerrye Noble, MD) and does need an [facility] hospital follow-up appointment after discharge.  The patient does have transportation limitations that hinder transportation to clinic appointments.  .Services Needed at time of discharge: Y = Yes, Blank = No PT:   OT:   RN:   Equipment:   Other:     LOS: 1 day   Riccardo Dubin, MD 04/24/2015, 11:26 AM

## 2015-04-24 NOTE — Progress Notes (Signed)
Called Dr. Samul Dada to make sure that patient is to stay until his potassium results come back. Patient understood this but after confirming  I went back to explain to patient that he fhad to wait until and I would let him know as soon as the results are back. I went back to check on patient and his sufficient  Other and they had left without reporting to me or any other staff member . I notified MD and charge nurse [place].

## 2015-04-24 NOTE — Progress Notes (Signed)
Patient left AMA and without signing papers MD aware / Dr.Jones called and I explained to him what had occurred see previous note

## 2015-04-24 NOTE — Progress Notes (Signed)
ANTICOAGULATION CONSULT NOTE - Initial Consult  Pharmacy Consult for Coumadin Indication: h/o pulmonary embolus  No Known Allergies  Patient Measurements: Height: 6\' 3"  (190.5 cm) Weight: 178 lb 5.6 oz (80.9 kg) IBW/kg (Calculated) : 84.5   Vital Signs: Temp: 99.8 F (37.7 C) (05/09 1049) Temp Source: Oral (05/09 1049) BP: 141/85 mmHg (05/09 1049) Pulse Rate: 104 (05/09 1049)  Labs:  Recent Labs  04/23/15 1901 04/23/15 2315 04/24/15 0054 04/24/15 0429 04/24/15 0740  HGB 10.9*  --   --   --  9.8*  HCT 32.3*  --   --   --  29.8*  PLT 161  --   --   --  139*  LABPROT 30.0*  --   --  33.8*  --   INR 2.83*  --   --  3.30*  --   CREATININE 9.10*  --  9.73* 9.95*  --   CKTOTAL  --  298  --   --   --     Estimated Creatinine Clearance: 11.3 mL/min (by C-G formula based on Cr of 9.95).  Assessment: 39 y.o male h/o PE on chronic coumadin. Admitted 04/23/15 after a fall on 5/7; Pt intoxicated last night, fell struck R forehead on table. CT head:  No hemorrhage, no evidence of old or acute infarction. Home dose resumed, but INR 2.83 > 3.3 this morning. Hgb 9.8, plt 139K.   PTA dose: coumadin 10 mg daily.   Goal of Therapy:  INR 2-3 Monitor platelets by anticoagulation protocol: Yes   Plan:  Hold coumadin tonight d/t big INR increase and supratherapeutic. Continue home dose Coumadin 10mg  daily from tomorrow if INR is trending down. Daily Protime/INR Monitor for signs/symptoms of bleeding.  Maryanna Shape, PharmD, BCPS  Clinical Pharmacist  Pager: [phone]   04/24/2015,12:19 PM

## 2015-04-24 NOTE — Progress Notes (Addendum)
Discussed w/ RN, patient left the hospital w/out waiting for potassium result to return. Did not sign AMA paperwork. Per RN, it was explained to him that he was not to leave until result of potassium had come back.   Signed: Luanne Bras, MD 04/24/2015 4:52 PM  K 4.5.   Signed: Luanne Bras, MD 04/24/2015 4:57 PM

## 2015-04-26 ENCOUNTER — Telehealth: Payer: Self-pay | Admitting: Internal Medicine

## 2015-04-26 ENCOUNTER — Encounter: Payer: Self-pay | Admitting: *Deleted

## 2015-04-26 DIAGNOSIS — N186 End stage renal disease: Secondary | ICD-10-CM | POA: Diagnosis not present

## 2015-04-26 DIAGNOSIS — I8291 Chronic embolism and thrombosis of unspecified vein: Secondary | ICD-10-CM | POA: Diagnosis not present

## 2015-04-26 DIAGNOSIS — N2581 Secondary hyperparathyroidism of renal origin: Secondary | ICD-10-CM | POA: Diagnosis not present

## 2015-04-26 DIAGNOSIS — D631 Anemia in chronic kidney disease: Secondary | ICD-10-CM | POA: Diagnosis not present

## 2015-04-26 DIAGNOSIS — D509 Iron deficiency anemia, unspecified: Secondary | ICD-10-CM | POA: Diagnosis not present

## 2015-04-26 NOTE — Telephone Encounter (Signed)
Call to patient to confirm appointment for 04/28/15 at 3:45 lmtcb

## 2015-04-28 ENCOUNTER — Encounter: Payer: Self-pay | Admitting: *Deleted

## 2015-04-28 ENCOUNTER — Telehealth: Payer: Self-pay | Admitting: Pharmacist

## 2015-04-28 ENCOUNTER — Encounter: Payer: Self-pay | Admitting: Internal Medicine

## 2015-04-28 ENCOUNTER — Ambulatory Visit (INDEPENDENT_AMBULATORY_CARE_PROVIDER_SITE_OTHER): Payer: Medicare Other | Admitting: Internal Medicine

## 2015-04-28 VITALS — BP 148/93 | HR 107 | Temp 98.0°F | Ht 74.0 in | Wt 173.3 lb

## 2015-04-28 DIAGNOSIS — N186 End stage renal disease: Secondary | ICD-10-CM | POA: Diagnosis not present

## 2015-04-28 DIAGNOSIS — Z09 Encounter for follow-up examination after completed treatment for conditions other than malignant neoplasm: Secondary | ICD-10-CM | POA: Diagnosis not present

## 2015-04-28 DIAGNOSIS — Z905 Acquired absence of kidney: Secondary | ICD-10-CM

## 2015-04-28 DIAGNOSIS — M62838 Other muscle spasm: Secondary | ICD-10-CM

## 2015-04-28 DIAGNOSIS — D509 Iron deficiency anemia, unspecified: Secondary | ICD-10-CM | POA: Diagnosis not present

## 2015-04-28 DIAGNOSIS — I10 Essential (primary) hypertension: Secondary | ICD-10-CM

## 2015-04-28 DIAGNOSIS — F329 Major depressive disorder, single episode, unspecified: Secondary | ICD-10-CM | POA: Diagnosis not present

## 2015-04-28 DIAGNOSIS — F17210 Nicotine dependence, cigarettes, uncomplicated: Secondary | ICD-10-CM

## 2015-04-28 DIAGNOSIS — K469 Unspecified abdominal hernia without obstruction or gangrene: Secondary | ICD-10-CM

## 2015-04-28 DIAGNOSIS — N2581 Secondary hyperparathyroidism of renal origin: Secondary | ICD-10-CM | POA: Diagnosis not present

## 2015-04-28 DIAGNOSIS — D631 Anemia in chronic kidney disease: Secondary | ICD-10-CM | POA: Diagnosis not present

## 2015-04-28 DIAGNOSIS — F32A Depression, unspecified: Secondary | ICD-10-CM | POA: Insufficient documentation

## 2015-04-28 LAB — BASIC METABOLIC PANEL
BUN: 31 mg/dL — AB (ref 6–23)
CHLORIDE: 96 meq/L (ref 96–112)
CO2: 32 mEq/L (ref 19–32)
CREATININE: 5.12 mg/dL — AB (ref 0.50–1.35)
Calcium: 7.9 mg/dL — ABNORMAL LOW (ref 8.4–10.5)
Glucose, Bld: 84 mg/dL (ref 70–99)
POTASSIUM: 3.6 meq/L (ref 3.5–5.3)
Sodium: 138 mEq/L (ref 135–145)

## 2015-04-28 MED ORDER — CYCLOBENZAPRINE HCL 5 MG PO TABS: 5.0000 mg | ORAL_TABLET | Freq: Three times a day (TID) | ORAL | Status: DC | PRN

## 2015-04-28 MED ORDER — OXYCODONE-ACETAMINOPHEN 10-325 MG PO TABS: 1.0000 | ORAL_TABLET | ORAL | Status: DC | PRN

## 2015-04-28 MED ORDER — SERTRALINE HCL 25 MG PO TABS: 25.0000 mg | ORAL_TABLET | Freq: Every day | ORAL | Status: DC

## 2015-04-28 NOTE — Assessment & Plan Note (Signed)
Patient has very tense abdominal musculature this could be a complication from postop manipulation during his partial nephrectomy. It seems that benzo seemed to relieve the pain which could also indicate muscular spasm. -Trial of Flexeril

## 2015-04-28 NOTE — Telephone Encounter (Signed)
Called patients phone number listed--no answer. Left message, reiterating to HOLD/OMIT today and tomorrow's doses of warfarin. Resume regularly scheduled dose on Sunday 15-MAY-16; Monday 16-MAY-16--have repeat INR performed at [facility]. Advised to make certain that they did not collect from a heparinized line. Instructed via message left on phone:  Any signs or symptoms of bleeding--call 911 or report to the ED.

## 2015-04-28 NOTE — Progress Notes (Unsigned)
Received INR report via fax from [facility] with INR of 7.46 - collected 5/11 and reported 5/12 Dr Elie Confer paged and he gave order for pt to hold coumadin Friday and Saturday then resume normal dose on Sunday. Recollect at Dialysis on Monday. Pt called and no answer.  Message was left with instructions and phone # for clinic. Dr Elie Confer will also call pt tonight.  Pt did have an Office visit in clinic today and was seen by Dr Algis Liming at [phone].  No signs of bleeding reported.

## 2015-04-28 NOTE — Assessment & Plan Note (Signed)
This seems to be an acute stress reaction. Patient does describe anhedonia, psychomotor retardation, and sleep disturbances. The patient has been resorting back to old habits to deal with his stress. The patient does have a good new support system. The patient denies any homicidal or suicidal ideation. The patient is interested in using medication along with CBT therapy. -Resources given for CBT therapy and alcohol treatment if needed -Will start Zoloft 25 g daily and uptitrate as needed -Follow-up in 4 weeks
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
## 2015-04-28 NOTE — Patient Instructions (Signed)
General Instructions:   Please try to bring all your medicines next time. This will help [place] keep you safe from mistakes.   Progress Toward Treatment Goals:  Treatment Goal 08/19/2013  Blood pressure at goal  Stop smoking smoking the same amount    Self Care Goals & Plans:  Self Care Goal 04/28/2015  Manage my medications take my medicines as prescribed; bring my medications to every visit; refill my medications on time  Eat healthy foods drink diet soda or water instead of juice or soda; eat more vegetables; eat foods that are low in salt; eat baked foods instead of fried foods; eat fruit for snacks and desserts  Be physically active -  Stop smoking -    No flowsheet data found.   Care Management & Community Referrals:  Referral 06/02/2013  Referrals made for care management support none needed

## 2015-04-28 NOTE — Telephone Encounter (Signed)
Called by Edd Fabian, Triage Nurse at IM [facility] while driving back from [employer] [place] [employer] [place] at [facility] where I was today (not in hospital). INR collected 11-MAY-16 made available to [place] TODAY = 7.4 Receiving this result by phone from [place] at 1832h today. She was advised to call the patient and instruct him to HOLD/OMIT TODAY and TOMORROW's doses of warfarin. Resume on Sunday with regularly scheduled dose. Repeat INR on Monday 16-MAY-16 while at [place]. Unable to query HD center--re:  Could INR have been collected from heparinized line while undergoing HD? Reviewed current documented medication list. Visited with PCP provider in IM [facility] today. I will attempt to call patient as well.

## 2015-04-28 NOTE — Assessment & Plan Note (Signed)
Still very easily reducible with no signs of strangulation. This does not seem to be the source of the patient's abdominal pain. Patient has been cleared for mesh surgery early in June.

## 2015-04-29 NOTE — Progress Notes (Signed)
Internal Medicine Clinic Attending  Case discussed with Dr. Sadek soon after the resident saw the patient.  We reviewed the resident's history and exam and pertinent patient test results.  I agree with the assessment, diagnosis, and plan of care documented in the resident's note. 

## 2015-05-01 DIAGNOSIS — D631 Anemia in chronic kidney disease: Secondary | ICD-10-CM | POA: Diagnosis not present

## 2015-05-01 DIAGNOSIS — Z86718 Personal history of other venous thrombosis and embolism: Secondary | ICD-10-CM | POA: Diagnosis not present

## 2015-05-01 DIAGNOSIS — N186 End stage renal disease: Secondary | ICD-10-CM | POA: Diagnosis not present

## 2015-05-01 DIAGNOSIS — N2581 Secondary hyperparathyroidism of renal origin: Secondary | ICD-10-CM | POA: Diagnosis not present

## 2015-05-01 DIAGNOSIS — D509 Iron deficiency anemia, unspecified: Secondary | ICD-10-CM | POA: Diagnosis not present

## 2015-05-02 ENCOUNTER — Other Ambulatory Visit: Payer: Self-pay | Admitting: Internal Medicine

## 2015-05-03 ENCOUNTER — Telehealth: Payer: Self-pay | Admitting: Pharmacist

## 2015-05-03 DIAGNOSIS — N186 End stage renal disease: Secondary | ICD-10-CM | POA: Diagnosis not present

## 2015-05-03 DIAGNOSIS — D509 Iron deficiency anemia, unspecified: Secondary | ICD-10-CM | POA: Diagnosis not present

## 2015-05-03 DIAGNOSIS — N2581 Secondary hyperparathyroidism of renal origin: Secondary | ICD-10-CM | POA: Diagnosis not present

## 2015-05-03 DIAGNOSIS — D631 Anemia in chronic kidney disease: Secondary | ICD-10-CM | POA: Diagnosis not present

## 2015-05-03 NOTE — Telephone Encounter (Signed)
Given result by Hilda Blades, Triage Nurse today at [place] for INR value collected MONDAY 16-MAY-16 resulted ONLY TODAY = 5.69. Hilda Blades attempted to call patient--no answer. Called HD center at number above was able to speak to patient. See documentation. Called patient at [place] at [phone] (portable phone) and spoke to patient at approximately 1245h today. I asked him if he had received my message from 13-MAY-16 based upon an INR we received 48h after it's draw. He indicated he has NOT received phone messaging (he has a recording--but it records only, and he has no way of retrieving messages he states) for the PAST 3 WEEKS. As such, he did NOT HOLD/OMIT this past Friday's dose or this past Saturday's dose. He has continued to take 2x5mg  warfarin PO each day.  We had a long discussion regarding these issues:  PT/INRs collected at HD and which are "sent out" (versus being a point of care--"real-time" value) are taking 24-48h for the entire process to play itself out--e.g. Collection, send-out, receiving results back to [facility] center--and them getting these results BACK to Triage Nurse here and them getting results to [place]. Reiterated the best management of warfarin would be in a face-to-face encounter. He is a "MWF" HD patient which he states has precluded his ability to come to my clinic on Mondays. I have asked him to come to the Clinic on NON-HD day(s) e.g. Tuesdays or Thursdays--and to see the IM [facility] Pharmacist (Dr. Mannie Stabile) who is present M-F in the IM [facility]. He agrees to this. He was instructed today to  OMIT TODAY's dose and TOMORROW's dose. He will resume warfarin by taking 1&1/2 x 5mg  (7.5mg ) on Friday May 20th - Monday May 23rd. On Tuesday May 24th 2016 at [facility] he is instructed (and he acknowledges by phone) to come to the IM [facility] for a fingerstick INR with real time interpretation and dosing as indicated.

## 2015-05-05 DIAGNOSIS — N2581 Secondary hyperparathyroidism of renal origin: Secondary | ICD-10-CM | POA: Diagnosis not present

## 2015-05-05 DIAGNOSIS — N186 End stage renal disease: Secondary | ICD-10-CM | POA: Diagnosis not present

## 2015-05-05 DIAGNOSIS — D631 Anemia in chronic kidney disease: Secondary | ICD-10-CM | POA: Diagnosis not present

## 2015-05-05 DIAGNOSIS — D509 Iron deficiency anemia, unspecified: Secondary | ICD-10-CM | POA: Diagnosis not present

## 2015-05-08 DIAGNOSIS — N186 End stage renal disease: Secondary | ICD-10-CM | POA: Diagnosis not present

## 2015-05-08 DIAGNOSIS — N2581 Secondary hyperparathyroidism of renal origin: Secondary | ICD-10-CM | POA: Diagnosis not present

## 2015-05-08 DIAGNOSIS — D509 Iron deficiency anemia, unspecified: Secondary | ICD-10-CM | POA: Diagnosis not present

## 2015-05-08 DIAGNOSIS — D631 Anemia in chronic kidney disease: Secondary | ICD-10-CM | POA: Diagnosis not present

## 2015-05-09 ENCOUNTER — Encounter: Payer: Self-pay | Admitting: Internal Medicine

## 2015-05-09 ENCOUNTER — Ambulatory Visit (INDEPENDENT_AMBULATORY_CARE_PROVIDER_SITE_OTHER): Payer: Medicare Other | Admitting: Internal Medicine

## 2015-05-09 VITALS — BP 141/97 | HR 116 | Temp 97.4°F | Wt 174.9 lb

## 2015-05-09 DIAGNOSIS — Z86718 Personal history of other venous thrombosis and embolism: Secondary | ICD-10-CM

## 2015-05-09 DIAGNOSIS — Z7901 Long term (current) use of anticoagulants: Secondary | ICD-10-CM | POA: Diagnosis not present

## 2015-05-09 DIAGNOSIS — Z86711 Personal history of pulmonary embolism: Secondary | ICD-10-CM

## 2015-05-09 DIAGNOSIS — J0100 Acute maxillary sinusitis, unspecified: Secondary | ICD-10-CM | POA: Diagnosis present

## 2015-05-09 DIAGNOSIS — B9689 Other specified bacterial agents as the cause of diseases classified elsewhere: Secondary | ICD-10-CM

## 2015-05-09 LAB — POCT INR: INR: 1.6

## 2015-05-09 MED ORDER — AMOXICILLIN-POT CLAVULANATE 875-125 MG PO TABS: 1.0000 | ORAL_TABLET | Freq: Two times a day (BID) | ORAL | Status: DC

## 2015-05-09 NOTE — Assessment & Plan Note (Signed)
Pt has hx of recurrent DVT and PE. On chronic coumadin and gets INR checked with HD sessions. Last INR on 5/18 was 5 and he was instructed to take 7.74m 5/20-23 and to have repeat INR done 5/24. INR was 1.6 today. Dr. KMaudie Mercurymet with pt to discuss coumadin regimen. Will f/u in clinic on 5/31 for INR check. Of note also starting augmentin 8770mBID x 5 days which can increase INR. Dr. KiMaudie Mercuryware.
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
## 2015-05-09 NOTE — Progress Notes (Signed)
Case discussed with Dr. Hulen Luster at time of visit.  We reviewed the resident's history and exam and pertinent patient test results.  I agree with the assessment, diagnosis, and plan of care documented in the resident's note.  It is postulated he has both a viral illness and sinusitis, hence the antibiotics for the sinusitis.  With the fevers and tachycardia and history of EtOH intoxication the differential also includes alcohol withdraw, and if his symptoms persist, we will have to assess for evidence of EtOH cessation.

## 2015-05-09 NOTE — Progress Notes (Signed)
Patient seen in collaboration with Dr. Aurelio Brash, states he has taken warfarin 5 mg daily for the past 3 days, and held daily x 3 days prior.   Indication: PE and DVT history Duration: indefinite  Anticoagulation Clinic Visit History: Anticoagulation Episode Summary    Current INR goal    Next INR check 11/26/2013   INR from last check 2.3 (11/05/2013)   Most recent INR 1.6 (05/09/2015)   Weekly max dose    Target end date    INR check location    Preferred lab    Send INR reminders to       Comments        ASSESSMENT Recent Results: Recent results are below, the most recent result is correlated with a dose of 25 mg per week: Lab Results  Component Value Date   INR 1.6 05/09/2015   INR 3.30* 04/24/2015   INR 2.83* 04/23/2015   INR today: Subtherapeutic  PLAN Weekly dose was increased to 35 mg per week.  Follow-up Return in 1 week.  [name] Clinical Pharmacist  10 minutes spent face-to-face with the patient during the encounter. 50% of time spent on education. 50% of time was spent on assessment.

## 2015-05-09 NOTE — Patient Instructions (Signed)
Take Augmentin twice a day for 5 days for your acute sinusitis. If you do not notice any improvements after 7-10 days call the clinic for an appointment.   Amoxicillin; Clavulanic Acid chewable tablets What is this medicine? AMOXICILLIN; CLAVULANIC ACID (a mox i SIL in; KLAV yoo lan ic AS id) is a penicillin antibiotic. It is used to treat certain kinds of bacterial infections. It It will not work for colds, flu, or other viral infections. This medicine may be used for other purposes; ask your health care provider or pharmacist if you have questions. COMMON BRAND NAME(S): Augmentin What should I tell my health care provider before I take this medicine? They need to know if you have any of these conditions: -bowel disease, like colitis -kidney disease -liver disease -mononucleosis -phenylketonuria -an unusual or allergic reaction to amoxicillin, penicillin, cephalosporin, other antibiotics, clavulanic acid, other medicines, foods, dyes, or preservatives -pregnant or trying to get pregnant -breast-feeding How should I use this medicine? Take this medicine by mouth. Chew it completely before swallowing. Follow the directions on the prescription label. Take this medicine at the start of a meal or snack. Take your medicine at regular intervals. Do not take your medicine more often than directed. Take all of your medicine as directed even if you think you are better. Do not skip doses or stop your medicine early. Talk to your pediatrician regarding the use of this medicine in children. While this drug may be prescribed for selected conditions, precautions do apply. Overdosage: If you think you have taken too much of this medicine contact a poison control center or emergency room at once. NOTE: This medicine is only for you. Do not share this medicine with others. What if I miss a dose? If you miss a dose, take it as soon as you can. If it is almost time for your next dose, take only that dose. Do  not take double or extra doses. What may interact with this medicine? -allopurinol -anticoagulants -birth control pills -methotrexate -probenecid This list may not describe all possible interactions. Give your health care provider a list of all the medicines, herbs, non-prescription drugs, or dietary supplements you use. Also tell them if you smoke, drink alcohol, or use illegal drugs. Some items may interact with your medicine. What should I watch for while using this medicine? Tell your doctor or health care professional if your symptoms do not improve. Do not treat diarrhea with over the counter products. Contact your doctor if you have diarrhea that lasts more than 2 days or if it is severe and watery. If you have diabetes, you may get a false-positive result for sugar in your urine. Check with your doctor or health care professional. Birth control pills may not work properly while you are taking this medicine. Talk to your doctor about using an extra method of birth control. What side effects may I notice from receiving this medicine? Side effects that you should report to your doctor or health care professional as soon as possible: -allergic reactions like skin rash, itching or hives, swelling of the face, lips, or tongue -breathing problems -dark urine -fever or chills, sore throat -redness, blistering, peeling or loosening of the skin, including inside the mouth -seizures -trouble passing urine or change in the amount of urine -unusual bleeding, bruising -unusually weak or tired -white patches or sores in the mouth or throat Side effects that usually do not require medical attention (report to your doctor or health care professional if they  continue or are bothersome): -diarrhea -dizziness -headache -nausea, vomiting -stomach upset -vaginal or anal irritation This list may not describe all possible side effects. Call your doctor for medical advice about side effects. You may  report side effects to FDA at 1-800-FDA-1088. Where should I keep my medicine? Keep out of the reach of children. Store at room temperature below 25 degrees C (77 degrees F). Keep container tightly closed. Throw away any unused medicine after the expiration date. NOTE: This sheet is a summary. It may not cover all possible information. If you have questions about this medicine, talk to your doctor, pharmacist, or health care provider.  2015, Elsevier/Gold Standard. (2008-02-25 11:38:22)

## 2015-05-09 NOTE — Progress Notes (Signed)
   Subjective:    Patient ID: Brian [name], male    DOB: 10-29-1976, 39 y.o.   MRN: [id number]  HPI Pt is a 39 y/o male w/ PMHx of HIV, ESRD on HD MWF, depression, and HTN who presents to clinic for an acute visit for sinusitis and INR check. Please see problem list for further details.      Review of Systems  Constitutional: Positive for fever, chills and fatigue.  HENT: Negative for sore throat.   Eyes:       Rt eye pressure    Respiratory: Negative for shortness of breath.   Gastrointestinal: Positive for vomiting.  Hematological: Bruises/bleeds easily.       Objective:   Physical Exam  Constitutional: He appears well-developed and well-nourished. No distress.  HENT:  Head: Normocephalic.  Mouth/Throat: No oropharyngeal exudate.  TTP of rt maxillary sinus, blue/pink bruise beneath rt orbit, neg for ulcerations on upper gums.   Neck: Neck supple.  Cardiovascular: Normal rate.   Tachycardic   Pulmonary/Chest: Effort normal and breath sounds normal. He has no wheezes.  Abdominal: Soft. Bowel sounds are normal. There is no tenderness.  Lymphadenopathy:    He has no cervical adenopathy.  Skin: Skin is warm and dry.          Assessment & Plan:  Please see problem based assessment and plan.

## 2015-05-10 ENCOUNTER — Other Ambulatory Visit: Payer: Self-pay | Admitting: Internal Medicine

## 2015-05-10 DIAGNOSIS — N2581 Secondary hyperparathyroidism of renal origin: Secondary | ICD-10-CM | POA: Diagnosis not present

## 2015-05-10 DIAGNOSIS — D631 Anemia in chronic kidney disease: Secondary | ICD-10-CM | POA: Diagnosis not present

## 2015-05-10 DIAGNOSIS — D509 Iron deficiency anemia, unspecified: Secondary | ICD-10-CM | POA: Diagnosis not present

## 2015-05-10 DIAGNOSIS — N186 End stage renal disease: Secondary | ICD-10-CM | POA: Diagnosis not present

## 2015-05-12 DIAGNOSIS — D631 Anemia in chronic kidney disease: Secondary | ICD-10-CM | POA: Diagnosis not present

## 2015-05-12 DIAGNOSIS — N186 End stage renal disease: Secondary | ICD-10-CM | POA: Diagnosis not present

## 2015-05-12 DIAGNOSIS — N2581 Secondary hyperparathyroidism of renal origin: Secondary | ICD-10-CM | POA: Diagnosis not present

## 2015-05-12 DIAGNOSIS — D509 Iron deficiency anemia, unspecified: Secondary | ICD-10-CM | POA: Diagnosis not present

## 2015-05-15 DIAGNOSIS — N2581 Secondary hyperparathyroidism of renal origin: Secondary | ICD-10-CM | POA: Diagnosis not present

## 2015-05-15 DIAGNOSIS — D631 Anemia in chronic kidney disease: Secondary | ICD-10-CM | POA: Diagnosis not present

## 2015-05-15 DIAGNOSIS — D509 Iron deficiency anemia, unspecified: Secondary | ICD-10-CM | POA: Diagnosis not present

## 2015-05-15 DIAGNOSIS — N186 End stage renal disease: Secondary | ICD-10-CM | POA: Diagnosis not present

## 2015-05-16 ENCOUNTER — Encounter: Payer: Self-pay | Admitting: Internal Medicine

## 2015-05-16 ENCOUNTER — Other Ambulatory Visit: Payer: Self-pay | Admitting: Internal Medicine

## 2015-05-16 ENCOUNTER — Ambulatory Visit (INDEPENDENT_AMBULATORY_CARE_PROVIDER_SITE_OTHER): Payer: Medicare Other | Admitting: Pharmacist

## 2015-05-16 ENCOUNTER — Ambulatory Visit (INDEPENDENT_AMBULATORY_CARE_PROVIDER_SITE_OTHER): Payer: Medicare Other | Admitting: Internal Medicine

## 2015-05-16 VITALS — BP 140/93 | HR 111 | Temp 97.7°F | Ht 75.0 in | Wt 169.8 lb

## 2015-05-16 DIAGNOSIS — Z86711 Personal history of pulmonary embolism: Secondary | ICD-10-CM

## 2015-05-16 DIAGNOSIS — J0101 Acute recurrent maxillary sinusitis: Secondary | ICD-10-CM | POA: Diagnosis present

## 2015-05-16 DIAGNOSIS — Z86718 Personal history of other venous thrombosis and embolism: Secondary | ICD-10-CM | POA: Diagnosis present

## 2015-05-16 DIAGNOSIS — Z7901 Long term (current) use of anticoagulants: Secondary | ICD-10-CM | POA: Diagnosis not present

## 2015-05-16 DIAGNOSIS — Z992 Dependence on renal dialysis: Secondary | ICD-10-CM | POA: Diagnosis not present

## 2015-05-16 DIAGNOSIS — I120 Hypertensive chronic kidney disease with stage 5 chronic kidney disease or end stage renal disease: Secondary | ICD-10-CM | POA: Diagnosis not present

## 2015-05-16 DIAGNOSIS — N186 End stage renal disease: Secondary | ICD-10-CM | POA: Diagnosis not present

## 2015-05-16 LAB — POCT INR: INR: 1.8

## 2015-05-16 MED ORDER — FLUTICASONE PROPIONATE 50 MCG/ACT NA SUSP: 2.0000 | Freq: Every day | NASAL | Status: DC

## 2015-05-16 MED ORDER — CETIRIZINE HCL 5 MG PO TABS: 5.0000 mg | ORAL_TABLET | Freq: Every day | ORAL | Status: DC
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
## 2015-05-16 NOTE — Progress Notes (Signed)
CLINICAL PHARMACIST ANTICOAG NOTE [name] is a 39 y.o. male who reports to the clinic for monitoring of warfarin treatment.    Indication: PE and DVT history Duration: indefinite  Anticoagulation Clinic Visit History: Anticoagulation Episode Summary    Current INR goal    Next INR check 06/01/2015   INR from last check 1.8 (05/16/2015)   Weekly max dose    Target end date    INR check location    Preferred lab    Send INR reminders to       Comments        ASSESSMENT Recent Results: Recent results are below, the most recent result is correlated with a dose of 35 mg per week: Lab Results  Component Value Date   INR 1.8 05/16/2015   INR 1.6 05/09/2015   INR 3.30* 04/24/2015    INR today: Subtherapeutic  Anticoagulation Dosing: INR as of 05/16/2015 and Previous Dosing Information    INR Dt INR Goal Wkly Tot Sun Mon Tue Wed Thu Fri Sat   05/16/2015 1.8 - 35 mg 5 mg 5 mg 5 mg 5 mg 5 mg 5 mg 5 mg   Patient deviated from recommended dosing.       Anticoagulation Dose Instructions as of 05/16/2015      Total Sun Mon Tue Wed Thu Fri Sat   New Dose 37.5 mg 5 mg 5 mg 7.5 mg 5 mg 5 mg 5 mg 5 mg     (5 mg x 1)  (5 mg x 1)  (5 mg x 1.5)  (5 mg x 1)  (5 mg x 1)  (5 mg x 1)  (5 mg x 1)                           PLAN Weekly dose was increased by 7% to 35 mg per week  Patient Instructions  Patient educated about medication as defined in this encounter and verbalized understanding by repeating back instructions provided.    Follow-up Return in about 2 weeks (around 06/01/2015) for Follow up INR on 06/01/15 at 2pm.  [place] Pharmacist  15 minutes spent face-to-face with the patient during the encounter. 50% of time spent on education. 50% of time was spent on assessment and plan.

## 2015-05-16 NOTE — Assessment & Plan Note (Signed)
Feel this is likely an ongoing viral sinusitis at this time. Had taken a course of antibiotics w/ some relief, however, it seems his symptoms have returned somewhat. He admits to nasal drainage, sinus pressure, and congestion as he was having previously. Also notes mild frontal headaches. Noted some small amount of blood coming from his left ear the other day after he was scratching in it. On ear exam, still some scant dried blood present, however, ears look completely normal; tympanic membranes intact bilaterally, no effusion, irregularities, or perforations. No erythema. Given changing seasons and seasonal allergies being quite common, feel that inflammation is likely the biggest issue w/ ongoing infection.  -Start Flonase 2 sprays daily -Zyrtec 5 mg daily -Return to clinic in 2 weeks; if no improvement, will need to consider referral to ENT.

## 2015-05-16 NOTE — Patient Instructions (Signed)
Patient educated about medication as defined in this encounter and verbalized understanding by repeating back instructions provided.   

## 2015-05-16 NOTE — Patient Instructions (Signed)
1. Schedule a follow up appointment for 2 weeks.   2. Please take all medications as previously prescribed with the following changes:  Start using Flonase nasal spray and Zyrtec allergy medicine as directed.   If these medications do not help your symptoms, we will need to refer you to an ENT specialist.   3. If you have worsening of your symptoms or new symptoms arise, please call the clinic [phone]), or go to the ER immediately if symptoms are severe.

## 2015-05-17 DIAGNOSIS — N2581 Secondary hyperparathyroidism of renal origin: Secondary | ICD-10-CM | POA: Diagnosis not present

## 2015-05-17 DIAGNOSIS — N186 End stage renal disease: Secondary | ICD-10-CM | POA: Diagnosis not present

## 2015-05-17 DIAGNOSIS — E875 Hyperkalemia: Secondary | ICD-10-CM | POA: Diagnosis not present

## 2015-05-17 DIAGNOSIS — D509 Iron deficiency anemia, unspecified: Secondary | ICD-10-CM | POA: Diagnosis not present

## 2015-05-19 DIAGNOSIS — D509 Iron deficiency anemia, unspecified: Secondary | ICD-10-CM | POA: Diagnosis not present

## 2015-05-19 DIAGNOSIS — N2581 Secondary hyperparathyroidism of renal origin: Secondary | ICD-10-CM | POA: Diagnosis not present

## 2015-05-19 DIAGNOSIS — E875 Hyperkalemia: Secondary | ICD-10-CM | POA: Diagnosis not present

## 2015-05-19 DIAGNOSIS — N186 End stage renal disease: Secondary | ICD-10-CM | POA: Diagnosis not present

## 2015-05-22 DIAGNOSIS — N186 End stage renal disease: Secondary | ICD-10-CM | POA: Diagnosis not present

## 2015-05-22 DIAGNOSIS — D509 Iron deficiency anemia, unspecified: Secondary | ICD-10-CM | POA: Diagnosis not present

## 2015-05-22 DIAGNOSIS — N2581 Secondary hyperparathyroidism of renal origin: Secondary | ICD-10-CM | POA: Diagnosis not present

## 2015-05-22 DIAGNOSIS — E875 Hyperkalemia: Secondary | ICD-10-CM | POA: Diagnosis not present

## 2015-05-22 NOTE — Progress Notes (Signed)
Case discussed with Dr. Jones at time of visit.  We reviewed the resident's history and exam and pertinent patient test results.  I agree with the assessment, diagnosis, and plan of care documented in the resident's note. 

## 2015-05-24 DIAGNOSIS — N2581 Secondary hyperparathyroidism of renal origin: Secondary | ICD-10-CM | POA: Diagnosis not present

## 2015-05-24 DIAGNOSIS — E875 Hyperkalemia: Secondary | ICD-10-CM | POA: Diagnosis not present

## 2015-05-24 DIAGNOSIS — I8291 Chronic embolism and thrombosis of unspecified vein: Secondary | ICD-10-CM | POA: Diagnosis not present

## 2015-05-24 DIAGNOSIS — N186 End stage renal disease: Secondary | ICD-10-CM | POA: Diagnosis not present

## 2015-05-24 DIAGNOSIS — D509 Iron deficiency anemia, unspecified: Secondary | ICD-10-CM | POA: Diagnosis not present

## 2015-05-26 ENCOUNTER — Telehealth: Payer: Self-pay | Admitting: Pharmacist

## 2015-05-26 DIAGNOSIS — N186 End stage renal disease: Secondary | ICD-10-CM | POA: Diagnosis not present

## 2015-05-26 DIAGNOSIS — E875 Hyperkalemia: Secondary | ICD-10-CM | POA: Diagnosis not present

## 2015-05-26 DIAGNOSIS — N2581 Secondary hyperparathyroidism of renal origin: Secondary | ICD-10-CM | POA: Diagnosis not present

## 2015-05-26 DIAGNOSIS — D509 Iron deficiency anemia, unspecified: Secondary | ICD-10-CM | POA: Diagnosis not present

## 2015-05-29 DIAGNOSIS — E875 Hyperkalemia: Secondary | ICD-10-CM | POA: Diagnosis not present

## 2015-05-29 DIAGNOSIS — D509 Iron deficiency anemia, unspecified: Secondary | ICD-10-CM | POA: Diagnosis not present

## 2015-05-29 DIAGNOSIS — N2581 Secondary hyperparathyroidism of renal origin: Secondary | ICD-10-CM | POA: Diagnosis not present

## 2015-05-29 DIAGNOSIS — N186 End stage renal disease: Secondary | ICD-10-CM | POA: Diagnosis not present

## 2015-05-30 ENCOUNTER — Other Ambulatory Visit: Payer: Self-pay | Admitting: *Deleted

## 2015-05-31 ENCOUNTER — Telehealth: Payer: Self-pay | Admitting: Pharmacist

## 2015-05-31 DIAGNOSIS — D509 Iron deficiency anemia, unspecified: Secondary | ICD-10-CM | POA: Diagnosis not present

## 2015-05-31 DIAGNOSIS — N186 End stage renal disease: Secondary | ICD-10-CM | POA: Diagnosis not present

## 2015-05-31 DIAGNOSIS — E875 Hyperkalemia: Secondary | ICD-10-CM | POA: Diagnosis not present

## 2015-05-31 DIAGNOSIS — N2581 Secondary hyperparathyroidism of renal origin: Secondary | ICD-10-CM | POA: Diagnosis not present

## 2015-05-31 NOTE — Telephone Encounter (Signed)
Call to patient to confirm appointment for 6/16 at 2:00 lmtcb

## 2015-06-01 ENCOUNTER — Ambulatory Visit (INDEPENDENT_AMBULATORY_CARE_PROVIDER_SITE_OTHER): Payer: Medicare Other | Admitting: Pharmacist

## 2015-06-01 ENCOUNTER — Other Ambulatory Visit: Payer: Self-pay | Admitting: Internal Medicine

## 2015-06-01 DIAGNOSIS — B20 Human immunodeficiency virus [HIV] disease: Secondary | ICD-10-CM

## 2015-06-01 DIAGNOSIS — Z7901 Long term (current) use of anticoagulants: Secondary | ICD-10-CM | POA: Diagnosis present

## 2015-06-01 LAB — POCT INR: INR: 1.3

## 2015-06-01 MED ORDER — ABACAVIR SULFATE 300 MG PO TABS: ORAL_TABLET | ORAL | Status: DC

## 2015-06-01 MED ORDER — EFAVIRENZ 600 MG PO TABS: ORAL_TABLET | ORAL | Status: DC

## 2015-06-01 MED ORDER — ZIDOVUDINE 300 MG PO TABS: 300.0000 mg | ORAL_TABLET | Freq: Every day | ORAL | Status: DC

## 2015-06-01 NOTE — Progress Notes (Signed)
Anti-Coagulation Progress Note  Brian [name] is a 39 y.o. male who is currently on an anti-coagulation regimen.    RECENT RESULTS: Recent results are below, the most recent result is correlated with a dose of 37.5 mg. per week: Lab Results  Component Value Date   INR 1.30 06/01/2015   INR 1.8 05/16/2015   INR 1.6 05/09/2015    ANTI-COAG DOSE: Anticoagulation Dose Instructions as of 06/01/2015      Dorene Grebe Tue Wed Thu Fri Sat   New Dose 5 mg 5 mg 7.5 mg 5 mg 7.5 mg 5 mg 7.5 mg       ANTICOAG SUMMARY: Anticoagulation Episode Summary    Current INR goal    Next INR check 06/08/2015   INR from last check 1.30 (06/01/2015)   Weekly max dose    Target end date    INR check location    Preferred lab    Send INR reminders to       Comments         ANTICOAG TODAY: Anticoagulation Summary as of 06/01/2015    INR goal    Selected INR 1.30 (06/01/2015)   Next INR check 06/08/2015   Target end date     Anticoagulation Episode Summary    INR check location    Preferred lab    Send INR reminders to    Comments       PATIENT INSTRUCTIONS: Patient Instructions  Patient instructed to take medications as defined in the Anti-coagulation Track section of this encounter.  Patient instructed to take today's dose.  Patient verbalized understanding of these instructions.       FOLLOW-UP Return in 7 days (on 06/08/2015) for Follow-up INR @ 1600.   Theron Arista, PharmD Clinical Pharmacist - Resident Pager: [phone] 6/16/20164:32 PM

## 2015-06-01 NOTE — Patient Instructions (Signed)
Patient instructed to take medications as defined in the Anti-coagulation Track section of this encounter.  Patient instructed to take today's dose.  Patient verbalized understanding of these instructions.    

## 2015-06-02 DIAGNOSIS — N186 End stage renal disease: Secondary | ICD-10-CM | POA: Diagnosis not present

## 2015-06-02 DIAGNOSIS — E875 Hyperkalemia: Secondary | ICD-10-CM | POA: Diagnosis not present

## 2015-06-02 DIAGNOSIS — D509 Iron deficiency anemia, unspecified: Secondary | ICD-10-CM | POA: Diagnosis not present

## 2015-06-02 DIAGNOSIS — N2581 Secondary hyperparathyroidism of renal origin: Secondary | ICD-10-CM | POA: Diagnosis not present

## 2015-06-05 DIAGNOSIS — N186 End stage renal disease: Secondary | ICD-10-CM | POA: Diagnosis not present

## 2015-06-05 DIAGNOSIS — E875 Hyperkalemia: Secondary | ICD-10-CM | POA: Diagnosis not present

## 2015-06-05 DIAGNOSIS — D509 Iron deficiency anemia, unspecified: Secondary | ICD-10-CM | POA: Diagnosis not present

## 2015-06-05 DIAGNOSIS — N2581 Secondary hyperparathyroidism of renal origin: Secondary | ICD-10-CM | POA: Diagnosis not present

## 2015-06-05 NOTE — Telephone Encounter (Signed)
Unable to reach patient for follow up

## 2015-06-06 ENCOUNTER — Other Ambulatory Visit: Payer: Self-pay | Admitting: Internal Medicine

## 2015-06-06 NOTE — Progress Notes (Signed)
Have reviewed anticoagulation note.  On Warfarin for recurrent VTE.  INR low, coumadin dose increased.

## 2015-06-07 DIAGNOSIS — N2581 Secondary hyperparathyroidism of renal origin: Secondary | ICD-10-CM | POA: Diagnosis not present

## 2015-06-07 DIAGNOSIS — D509 Iron deficiency anemia, unspecified: Secondary | ICD-10-CM | POA: Diagnosis not present

## 2015-06-07 DIAGNOSIS — E875 Hyperkalemia: Secondary | ICD-10-CM | POA: Diagnosis not present

## 2015-06-07 DIAGNOSIS — N186 End stage renal disease: Secondary | ICD-10-CM | POA: Diagnosis not present

## 2015-06-08 ENCOUNTER — Other Ambulatory Visit: Payer: Self-pay | Admitting: Internal Medicine

## 2015-06-08 ENCOUNTER — Ambulatory Visit: Payer: Medicare Other | Admitting: Pharmacist

## 2015-06-09 DIAGNOSIS — D509 Iron deficiency anemia, unspecified: Secondary | ICD-10-CM | POA: Diagnosis not present

## 2015-06-09 DIAGNOSIS — N186 End stage renal disease: Secondary | ICD-10-CM | POA: Diagnosis not present

## 2015-06-09 DIAGNOSIS — N2581 Secondary hyperparathyroidism of renal origin: Secondary | ICD-10-CM | POA: Diagnosis not present

## 2015-06-09 DIAGNOSIS — E875 Hyperkalemia: Secondary | ICD-10-CM | POA: Diagnosis not present

## 2015-06-12 ENCOUNTER — Telehealth: Payer: Self-pay | Admitting: Pharmacist

## 2015-06-12 DIAGNOSIS — N186 End stage renal disease: Secondary | ICD-10-CM | POA: Diagnosis not present

## 2015-06-12 DIAGNOSIS — D509 Iron deficiency anemia, unspecified: Secondary | ICD-10-CM | POA: Diagnosis not present

## 2015-06-12 DIAGNOSIS — N2581 Secondary hyperparathyroidism of renal origin: Secondary | ICD-10-CM | POA: Diagnosis not present

## 2015-06-12 DIAGNOSIS — E875 Hyperkalemia: Secondary | ICD-10-CM | POA: Diagnosis not present

## 2015-06-12 MED ORDER — ZOLPIDEM TARTRATE 5 MG PO TABS: 5.0000 mg | ORAL_TABLET | Freq: Every evening | ORAL | Status: DC | PRN

## 2015-06-12 NOTE — Telephone Encounter (Signed)
Call to patient to confirm appointment for 06/13/15 at 4:00 lmtcb

## 2015-06-12 NOTE — Telephone Encounter (Signed)
Rx called in 

## 2015-06-13 ENCOUNTER — Ambulatory Visit (INDEPENDENT_AMBULATORY_CARE_PROVIDER_SITE_OTHER): Payer: Medicare Other | Admitting: Pharmacist

## 2015-06-13 ENCOUNTER — Telehealth: Payer: Self-pay | Admitting: *Deleted

## 2015-06-13 DIAGNOSIS — I82509 Chronic embolism and thrombosis of unspecified deep veins of unspecified lower extremity: Secondary | ICD-10-CM | POA: Diagnosis not present

## 2015-06-13 DIAGNOSIS — Z7901 Long term (current) use of anticoagulants: Secondary | ICD-10-CM

## 2015-06-13 DIAGNOSIS — I2699 Other pulmonary embolism without acute cor pulmonale: Secondary | ICD-10-CM | POA: Diagnosis not present

## 2015-06-13 LAB — POCT INR: INR: 1.9

## 2015-06-13 NOTE — Patient Instructions (Signed)
Patient instructed to take medications as defined in the Anti-coagulation Track section of this encounter.  Patient instructed to take today's dose.  Patient verbalized understanding of these instructions.    

## 2015-06-13 NOTE — Progress Notes (Signed)
INTERNAL MEDICINE TEACHING ATTENDING ADDENDUM - Brian [name] M.D  Duration- indefinite, Indication- PE, DVT (unprovoked), INR- subtherapeutic. Agree with pharmacy recommendations as outlined in their note.

## 2015-06-13 NOTE — Progress Notes (Signed)
Anti-Coagulation Progress Note  Brian [name] is a 39 y.o. male who is currently on an anti-coagulation regimen.    RECENT RESULTS: Recent results are below, the most recent result is correlated with a dose of 42.5 mg. per week: Lab Results  Component Value Date   INR 1.90 06/13/2015   INR 1.30 06/01/2015   INR 1.8 05/16/2015    ANTI-COAG DOSE: Anticoagulation Dose Instructions as of 06/13/2015      Dorene Grebe Tue Wed Thu Fri Sat   New Dose 5 mg 5 mg 10 mg 5 mg 10 mg 5 mg 5 mg       ANTICOAG SUMMARY: Anticoagulation Episode Summary    Current INR goal    Next INR check 06/27/2015   INR from last check 1.90 (06/13/2015)   Weekly max dose    Target end date    INR check location    Preferred lab    Send INR reminders to       Comments         ANTICOAG TODAY: Anticoagulation Summary as of 06/13/2015    INR goal    Selected INR 1.90 (06/13/2015)   Next INR check 06/27/2015   Target end date     Anticoagulation Episode Summary    INR check location    Preferred lab    Send INR reminders to    Comments       PATIENT INSTRUCTIONS: Patient Instructions  Patient instructed to take medications as defined in the Anti-coagulation Track section of this encounter.  Patient instructed to take today's dose.  Patient verbalized understanding of these instructions.        FOLLOW-UP Return in 2 weeks (on 06/27/2015) for Follow-up INR @ 1600.   Theron Arista, PharmD Clinical Pharmacist - Resident Pager: [phone] 6/28/20164:17 PM

## 2015-06-13 NOTE — Telephone Encounter (Signed)
Call to Medicare for Prior Authorization for Oxycodone/Acetaminophen 10-325.  Information was given to Agent.  Needs to Prior Authorization and to have the Pharmacy call in 10-15 minutes for approval.  Call to [email or web address] [employer] to notify them of approval.  Sander Nephew, RN 06/13/2015 12:15 PM.

## 2015-06-14 ENCOUNTER — Other Ambulatory Visit: Payer: Self-pay | Admitting: *Deleted

## 2015-06-14 DIAGNOSIS — E875 Hyperkalemia: Secondary | ICD-10-CM | POA: Diagnosis not present

## 2015-06-14 DIAGNOSIS — D509 Iron deficiency anemia, unspecified: Secondary | ICD-10-CM | POA: Diagnosis not present

## 2015-06-14 DIAGNOSIS — N186 End stage renal disease: Secondary | ICD-10-CM | POA: Diagnosis not present

## 2015-06-14 DIAGNOSIS — N2581 Secondary hyperparathyroidism of renal origin: Secondary | ICD-10-CM | POA: Diagnosis not present

## 2015-06-14 MED ORDER — DOCUSATE SODIUM 100 MG PO CAPS: 100.0000 mg | ORAL_CAPSULE | Freq: Two times a day (BID) | ORAL | Status: DC

## 2015-06-15 DIAGNOSIS — I120 Hypertensive chronic kidney disease with stage 5 chronic kidney disease or end stage renal disease: Secondary | ICD-10-CM | POA: Diagnosis not present

## 2015-06-15 DIAGNOSIS — N186 End stage renal disease: Secondary | ICD-10-CM | POA: Diagnosis not present

## 2015-06-15 DIAGNOSIS — Z992 Dependence on renal dialysis: Secondary | ICD-10-CM | POA: Diagnosis not present

## 2015-06-16 DIAGNOSIS — N2581 Secondary hyperparathyroidism of renal origin: Secondary | ICD-10-CM | POA: Diagnosis not present

## 2015-06-16 DIAGNOSIS — E875 Hyperkalemia: Secondary | ICD-10-CM | POA: Diagnosis not present

## 2015-06-16 DIAGNOSIS — D688 Other specified coagulation defects: Secondary | ICD-10-CM | POA: Diagnosis not present

## 2015-06-16 DIAGNOSIS — D509 Iron deficiency anemia, unspecified: Secondary | ICD-10-CM | POA: Diagnosis not present

## 2015-06-16 DIAGNOSIS — N186 End stage renal disease: Secondary | ICD-10-CM | POA: Diagnosis not present

## 2015-06-19 DIAGNOSIS — N2581 Secondary hyperparathyroidism of renal origin: Secondary | ICD-10-CM | POA: Diagnosis not present

## 2015-06-19 DIAGNOSIS — E875 Hyperkalemia: Secondary | ICD-10-CM | POA: Diagnosis not present

## 2015-06-19 DIAGNOSIS — N186 End stage renal disease: Secondary | ICD-10-CM | POA: Diagnosis not present

## 2015-06-19 DIAGNOSIS — D688 Other specified coagulation defects: Secondary | ICD-10-CM | POA: Diagnosis not present

## 2015-06-19 DIAGNOSIS — D509 Iron deficiency anemia, unspecified: Secondary | ICD-10-CM | POA: Diagnosis not present

## 2015-06-21 DIAGNOSIS — N186 End stage renal disease: Secondary | ICD-10-CM | POA: Diagnosis not present

## 2015-06-21 DIAGNOSIS — E875 Hyperkalemia: Secondary | ICD-10-CM | POA: Diagnosis not present

## 2015-06-21 DIAGNOSIS — D509 Iron deficiency anemia, unspecified: Secondary | ICD-10-CM | POA: Diagnosis not present

## 2015-06-21 DIAGNOSIS — N2581 Secondary hyperparathyroidism of renal origin: Secondary | ICD-10-CM | POA: Diagnosis not present

## 2015-06-21 DIAGNOSIS — D688 Other specified coagulation defects: Secondary | ICD-10-CM | POA: Diagnosis not present

## 2015-06-23 DIAGNOSIS — D688 Other specified coagulation defects: Secondary | ICD-10-CM | POA: Diagnosis not present

## 2015-06-23 DIAGNOSIS — E875 Hyperkalemia: Secondary | ICD-10-CM | POA: Diagnosis not present

## 2015-06-23 DIAGNOSIS — D509 Iron deficiency anemia, unspecified: Secondary | ICD-10-CM | POA: Diagnosis not present

## 2015-06-23 DIAGNOSIS — N186 End stage renal disease: Secondary | ICD-10-CM | POA: Diagnosis not present

## 2015-06-23 DIAGNOSIS — N2581 Secondary hyperparathyroidism of renal origin: Secondary | ICD-10-CM | POA: Diagnosis not present

## 2015-06-26 ENCOUNTER — Telehealth: Payer: Self-pay | Admitting: Pharmacist

## 2015-06-26 DIAGNOSIS — D509 Iron deficiency anemia, unspecified: Secondary | ICD-10-CM | POA: Diagnosis not present

## 2015-06-26 DIAGNOSIS — N2581 Secondary hyperparathyroidism of renal origin: Secondary | ICD-10-CM | POA: Diagnosis not present

## 2015-06-26 DIAGNOSIS — E875 Hyperkalemia: Secondary | ICD-10-CM | POA: Diagnosis not present

## 2015-06-26 DIAGNOSIS — D688 Other specified coagulation defects: Secondary | ICD-10-CM | POA: Diagnosis not present

## 2015-06-26 DIAGNOSIS — N186 End stage renal disease: Secondary | ICD-10-CM | POA: Diagnosis not present

## 2015-06-26 NOTE — Telephone Encounter (Signed)
Call to patient to confirm appointment for 06/27/15 at 4:00 lmtcb

## 2015-06-27 ENCOUNTER — Ambulatory Visit (INDEPENDENT_AMBULATORY_CARE_PROVIDER_SITE_OTHER): Payer: Medicare Other | Admitting: Pharmacist

## 2015-06-27 DIAGNOSIS — Z86711 Personal history of pulmonary embolism: Secondary | ICD-10-CM | POA: Diagnosis not present

## 2015-06-27 DIAGNOSIS — Z7901 Long term (current) use of anticoagulants: Secondary | ICD-10-CM | POA: Diagnosis not present

## 2015-06-27 DIAGNOSIS — Z86718 Personal history of other venous thrombosis and embolism: Secondary | ICD-10-CM

## 2015-06-27 LAB — POCT INR: INR: 1.6

## 2015-06-27 NOTE — Progress Notes (Signed)
CLINICAL PHARMACIST ANTICOAG NOTE Brian [name] is a 39 y.o. male who reports to the clinic for monitoring of warfarin treatment.    Indication: history DVT and PE Duration: indefinite  Anticoagulation Clinic Visit History: Anticoagulation Episode Summary    Current INR goal    Next INR check 07/20/2015   INR from last check 1.6 (06/27/2015)   Weekly max dose    Target end date    INR check location    Preferred lab    Send INR reminders to       Comments        ASSESSMENT Recent Results: Recent results are below, the most recent result is correlated with a dose of 45 mg per week: Lab Results  Component Value Date   INR 1.6 06/27/2015   INR 1.90 06/13/2015   INR 1.30 06/01/2015    INR today: Subtherapeutic  Anticoagulation Dosing: INR as of 06/27/2015 and Previous Dosing Information    INR Dt INR Goal [employer] Sun Mon Tue Wed Thu Fri Sat   06/27/2015 1.6 - 45 mg 5 mg 5 mg 10 mg 5 mg 10 mg 5 mg 5 mg    Anticoagulation Dose Instructions as of 06/27/2015      Total Sun Mon Tue Wed Thu Fri Sat   New Dose 50 mg 5 mg 5 mg 10 mg 5 mg 10 mg 5 mg 10 mg     (5 mg x 1)  (5 mg x 1)  (5 mg x 2)  (5 mg x 1)  (5 mg x 2)  (5 mg x 1)  (5 mg x 2)                           PLAN Weekly dose was increased by 11% to 50 mg per week  Patient Instructions  Patient educated about medication as defined in this encounter and verbalized understanding by repeating back instructions provided.   Follow-up Return in about 3 weeks (around 07/18/2015) for Follow up INR on 07/18/2015 at 2pm. Patient states he is unable to be seen sooner.  [name] Clinical Pharmacist  15 minutes spent face-to-face with the patient during the encounter. 50% of time spent on education. 50% of time was spent on assessment and plan.

## 2015-06-27 NOTE — Patient Instructions (Signed)
Patient educated about medication as defined in this encounter and verbalized understanding by repeating back instructions provided.   

## 2015-06-29 DIAGNOSIS — I8291 Chronic embolism and thrombosis of unspecified vein: Secondary | ICD-10-CM | POA: Diagnosis not present

## 2015-06-29 DIAGNOSIS — N186 End stage renal disease: Secondary | ICD-10-CM | POA: Diagnosis not present

## 2015-06-29 DIAGNOSIS — N2581 Secondary hyperparathyroidism of renal origin: Secondary | ICD-10-CM | POA: Diagnosis not present

## 2015-06-29 DIAGNOSIS — D688 Other specified coagulation defects: Secondary | ICD-10-CM | POA: Diagnosis not present

## 2015-06-29 DIAGNOSIS — E875 Hyperkalemia: Secondary | ICD-10-CM | POA: Diagnosis not present

## 2015-06-29 DIAGNOSIS — D509 Iron deficiency anemia, unspecified: Secondary | ICD-10-CM | POA: Diagnosis not present

## 2015-06-30 DIAGNOSIS — D509 Iron deficiency anemia, unspecified: Secondary | ICD-10-CM | POA: Diagnosis not present

## 2015-06-30 DIAGNOSIS — N2581 Secondary hyperparathyroidism of renal origin: Secondary | ICD-10-CM | POA: Diagnosis not present

## 2015-06-30 DIAGNOSIS — D688 Other specified coagulation defects: Secondary | ICD-10-CM | POA: Diagnosis not present

## 2015-06-30 DIAGNOSIS — E875 Hyperkalemia: Secondary | ICD-10-CM | POA: Diagnosis not present

## 2015-06-30 DIAGNOSIS — N186 End stage renal disease: Secondary | ICD-10-CM | POA: Diagnosis not present

## 2015-07-03 ENCOUNTER — Other Ambulatory Visit: Payer: Self-pay | Admitting: *Deleted

## 2015-07-03 DIAGNOSIS — N186 End stage renal disease: Secondary | ICD-10-CM | POA: Diagnosis not present

## 2015-07-03 DIAGNOSIS — D688 Other specified coagulation defects: Secondary | ICD-10-CM | POA: Diagnosis not present

## 2015-07-03 DIAGNOSIS — E875 Hyperkalemia: Secondary | ICD-10-CM | POA: Diagnosis not present

## 2015-07-03 DIAGNOSIS — N2581 Secondary hyperparathyroidism of renal origin: Secondary | ICD-10-CM | POA: Diagnosis not present

## 2015-07-03 DIAGNOSIS — D509 Iron deficiency anemia, unspecified: Secondary | ICD-10-CM | POA: Diagnosis not present

## 2015-07-03 MED ORDER — OXYCODONE-ACETAMINOPHEN 10-325 MG PO TABS: 1.0000 | ORAL_TABLET | ORAL | Status: DC | PRN

## 2015-07-04 NOTE — Telephone Encounter (Signed)
Pt aware.

## 2015-07-05 ENCOUNTER — Other Ambulatory Visit: Payer: Self-pay | Admitting: Internal Medicine

## 2015-07-05 DIAGNOSIS — N2581 Secondary hyperparathyroidism of renal origin: Secondary | ICD-10-CM | POA: Diagnosis not present

## 2015-07-05 DIAGNOSIS — E875 Hyperkalemia: Secondary | ICD-10-CM | POA: Diagnosis not present

## 2015-07-05 DIAGNOSIS — D688 Other specified coagulation defects: Secondary | ICD-10-CM | POA: Diagnosis not present

## 2015-07-05 DIAGNOSIS — D509 Iron deficiency anemia, unspecified: Secondary | ICD-10-CM | POA: Diagnosis not present

## 2015-07-05 DIAGNOSIS — N186 End stage renal disease: Secondary | ICD-10-CM | POA: Diagnosis not present

## 2015-07-07 DIAGNOSIS — D509 Iron deficiency anemia, unspecified: Secondary | ICD-10-CM | POA: Diagnosis not present

## 2015-07-07 DIAGNOSIS — D688 Other specified coagulation defects: Secondary | ICD-10-CM | POA: Diagnosis not present

## 2015-07-07 DIAGNOSIS — N2581 Secondary hyperparathyroidism of renal origin: Secondary | ICD-10-CM | POA: Diagnosis not present

## 2015-07-07 DIAGNOSIS — E875 Hyperkalemia: Secondary | ICD-10-CM | POA: Diagnosis not present

## 2015-07-07 DIAGNOSIS — N186 End stage renal disease: Secondary | ICD-10-CM | POA: Diagnosis not present

## 2015-07-10 DIAGNOSIS — N186 End stage renal disease: Secondary | ICD-10-CM | POA: Diagnosis not present

## 2015-07-10 DIAGNOSIS — D688 Other specified coagulation defects: Secondary | ICD-10-CM | POA: Diagnosis not present

## 2015-07-10 DIAGNOSIS — N2581 Secondary hyperparathyroidism of renal origin: Secondary | ICD-10-CM | POA: Diagnosis not present

## 2015-07-10 DIAGNOSIS — D509 Iron deficiency anemia, unspecified: Secondary | ICD-10-CM | POA: Diagnosis not present

## 2015-07-10 DIAGNOSIS — E875 Hyperkalemia: Secondary | ICD-10-CM | POA: Diagnosis not present

## 2015-07-12 DIAGNOSIS — N2581 Secondary hyperparathyroidism of renal origin: Secondary | ICD-10-CM | POA: Diagnosis not present

## 2015-07-12 DIAGNOSIS — N186 End stage renal disease: Secondary | ICD-10-CM | POA: Diagnosis not present

## 2015-07-12 DIAGNOSIS — D688 Other specified coagulation defects: Secondary | ICD-10-CM | POA: Diagnosis not present

## 2015-07-12 DIAGNOSIS — E875 Hyperkalemia: Secondary | ICD-10-CM | POA: Diagnosis not present

## 2015-07-12 DIAGNOSIS — D509 Iron deficiency anemia, unspecified: Secondary | ICD-10-CM | POA: Diagnosis not present

## 2015-07-13 DIAGNOSIS — Q613 Polycystic kidney, unspecified: Secondary | ICD-10-CM | POA: Diagnosis not present

## 2015-07-14 DIAGNOSIS — N186 End stage renal disease: Secondary | ICD-10-CM | POA: Diagnosis not present

## 2015-07-14 DIAGNOSIS — E875 Hyperkalemia: Secondary | ICD-10-CM | POA: Diagnosis not present

## 2015-07-14 DIAGNOSIS — D688 Other specified coagulation defects: Secondary | ICD-10-CM | POA: Diagnosis not present

## 2015-07-14 DIAGNOSIS — N2581 Secondary hyperparathyroidism of renal origin: Secondary | ICD-10-CM | POA: Diagnosis not present

## 2015-07-14 DIAGNOSIS — D509 Iron deficiency anemia, unspecified: Secondary | ICD-10-CM | POA: Diagnosis not present

## 2015-07-16 DIAGNOSIS — Z992 Dependence on renal dialysis: Secondary | ICD-10-CM | POA: Diagnosis not present

## 2015-07-16 DIAGNOSIS — N186 End stage renal disease: Secondary | ICD-10-CM | POA: Diagnosis not present

## 2015-07-16 DIAGNOSIS — I120 Hypertensive chronic kidney disease with stage 5 chronic kidney disease or end stage renal disease: Secondary | ICD-10-CM | POA: Diagnosis not present

## 2015-07-17 DIAGNOSIS — N186 End stage renal disease: Secondary | ICD-10-CM | POA: Diagnosis not present

## 2015-07-17 DIAGNOSIS — D509 Iron deficiency anemia, unspecified: Secondary | ICD-10-CM | POA: Diagnosis not present

## 2015-07-17 DIAGNOSIS — N2581 Secondary hyperparathyroidism of renal origin: Secondary | ICD-10-CM | POA: Diagnosis not present

## 2015-07-19 DIAGNOSIS — N2581 Secondary hyperparathyroidism of renal origin: Secondary | ICD-10-CM | POA: Diagnosis not present

## 2015-07-19 DIAGNOSIS — D509 Iron deficiency anemia, unspecified: Secondary | ICD-10-CM | POA: Diagnosis not present

## 2015-07-19 DIAGNOSIS — N186 End stage renal disease: Secondary | ICD-10-CM | POA: Diagnosis not present

## 2015-07-21 DIAGNOSIS — N186 End stage renal disease: Secondary | ICD-10-CM | POA: Diagnosis not present

## 2015-07-21 DIAGNOSIS — N2581 Secondary hyperparathyroidism of renal origin: Secondary | ICD-10-CM | POA: Diagnosis not present

## 2015-07-21 DIAGNOSIS — D509 Iron deficiency anemia, unspecified: Secondary | ICD-10-CM | POA: Diagnosis not present

## 2015-07-24 DIAGNOSIS — N186 End stage renal disease: Secondary | ICD-10-CM | POA: Diagnosis not present

## 2015-07-24 DIAGNOSIS — D509 Iron deficiency anemia, unspecified: Secondary | ICD-10-CM | POA: Diagnosis not present

## 2015-07-24 DIAGNOSIS — N2581 Secondary hyperparathyroidism of renal origin: Secondary | ICD-10-CM | POA: Diagnosis not present

## 2015-07-26 ENCOUNTER — Other Ambulatory Visit: Payer: Self-pay | Admitting: Internal Medicine

## 2015-07-26 DIAGNOSIS — I8291 Chronic embolism and thrombosis of unspecified vein: Secondary | ICD-10-CM | POA: Diagnosis not present

## 2015-07-26 DIAGNOSIS — N186 End stage renal disease: Secondary | ICD-10-CM | POA: Diagnosis not present

## 2015-07-26 DIAGNOSIS — D509 Iron deficiency anemia, unspecified: Secondary | ICD-10-CM | POA: Diagnosis not present

## 2015-07-26 DIAGNOSIS — N2581 Secondary hyperparathyroidism of renal origin: Secondary | ICD-10-CM | POA: Diagnosis not present

## 2015-07-27 ENCOUNTER — Ambulatory Visit (INDEPENDENT_AMBULATORY_CARE_PROVIDER_SITE_OTHER): Payer: Medicare Other | Admitting: Pharmacist

## 2015-07-27 DIAGNOSIS — Z888 Allergy status to other drugs, medicaments and biological substances status: Principal | ICD-10-CM

## 2015-07-27 DIAGNOSIS — Z86718 Personal history of other venous thrombosis and embolism: Secondary | ICD-10-CM | POA: Diagnosis present

## 2015-07-27 DIAGNOSIS — Z87898 Personal history of other specified conditions: Secondary | ICD-10-CM

## 2015-07-27 LAB — POCT INR: INR: 1.6

## 2015-07-27 NOTE — Patient Instructions (Signed)
Patient educated about medication as defined in this encounter and verbalized understanding by repeating back instructions provided.   

## 2015-07-27 NOTE — Progress Notes (Signed)
Patient ID: Brian [name], male   DOB: 12-19-1975, 39 y.o.   MRN: [id number] Anticoagulation Management Brian DISHAW is a 39 y.o. male who reports to the clinic for monitoring of warfarin treatment.    Indication: DVT and PE Duration: indefinite  Anticoagulation Clinic Visit History: Anticoagulation Episode Summary    Current INR goal    Next INR check 08/08/2015   INR from last check 1.6 (07/27/2015)   Weekly max dose    Target end date    INR check location    Preferred lab    Send INR reminders to       Comments        ASSESSMENT Recent Results: Recent results are below, the most recent result is correlated with a dose of 50 mg per week: Lab Results  Component Value Date   INR 1.6 06/27/2015   INR 1.90 06/13/2015   INR 1.30 06/01/2015    INR today: Subtherapeutic  Anticoagulation Dosing: INR as of 07/27/2015 and Previous Dosing Information    INR Dt INR Goal [employer] Sun Mon Tue Wed Thu Fri Sat   07/27/2015 1.6 - 50 mg 5 mg 5 mg 10 mg 5 mg 10 mg 5 mg 10 mg    Anticoagulation Dose Instructions as of 07/27/2015      Total Sun Mon Tue Wed Thu Fri Sat   New Dose 55 mg 10 mg 5 mg 10 mg 5 mg 10 mg 5 mg 10 mg     (5 mg x 2)  (5 mg x 1)  (5 mg x 2)  (5 mg x 1)  (5 mg x 2)  (5 mg x 1)  (5 mg x 2)                           PLAN Weekly dose was increased by 10% to 55 mg per week  There are no Patient Instructions on file for this visit.  Follow-up No Follow-up on file.  Patient has next appointment on 8/23 (10 days)  Viann Fish PharmD Candidate, 2017  15 minutes spent face-to-face with the patient during the encounter. 50% of time spent on education. 50% of time was spent on point-of-care testing.

## 2015-07-28 DIAGNOSIS — N2581 Secondary hyperparathyroidism of renal origin: Secondary | ICD-10-CM | POA: Diagnosis not present

## 2015-07-28 DIAGNOSIS — D509 Iron deficiency anemia, unspecified: Secondary | ICD-10-CM | POA: Diagnosis not present

## 2015-07-28 DIAGNOSIS — N186 End stage renal disease: Secondary | ICD-10-CM | POA: Diagnosis not present

## 2015-07-31 DIAGNOSIS — N2581 Secondary hyperparathyroidism of renal origin: Secondary | ICD-10-CM | POA: Diagnosis not present

## 2015-07-31 DIAGNOSIS — N186 End stage renal disease: Secondary | ICD-10-CM | POA: Diagnosis not present

## 2015-07-31 DIAGNOSIS — D509 Iron deficiency anemia, unspecified: Secondary | ICD-10-CM | POA: Diagnosis not present

## 2015-08-01 ENCOUNTER — Other Ambulatory Visit: Payer: Self-pay | Admitting: Internal Medicine

## 2015-08-01 NOTE — Telephone Encounter (Addendum)
Pt called requesting pain med to be filled. Pt want to pick up on Friday.

## 2015-08-02 DIAGNOSIS — N186 End stage renal disease: Secondary | ICD-10-CM | POA: Diagnosis not present

## 2015-08-02 DIAGNOSIS — D509 Iron deficiency anemia, unspecified: Secondary | ICD-10-CM | POA: Diagnosis not present

## 2015-08-02 DIAGNOSIS — N2581 Secondary hyperparathyroidism of renal origin: Secondary | ICD-10-CM | POA: Diagnosis not present

## 2015-08-02 MED ORDER — OXYCODONE-ACETAMINOPHEN 10-325 MG PO TABS: 1.0000 | ORAL_TABLET | ORAL | Status: DC | PRN

## 2015-08-02 NOTE — Telephone Encounter (Signed)
Medication refill done and pt informed

## 2015-08-03 ENCOUNTER — Other Ambulatory Visit: Payer: Self-pay | Admitting: Internal Medicine

## 2015-08-04 DIAGNOSIS — N2581 Secondary hyperparathyroidism of renal origin: Secondary | ICD-10-CM | POA: Diagnosis not present

## 2015-08-04 DIAGNOSIS — D509 Iron deficiency anemia, unspecified: Secondary | ICD-10-CM | POA: Diagnosis not present

## 2015-08-04 DIAGNOSIS — N186 End stage renal disease: Secondary | ICD-10-CM | POA: Diagnosis not present

## 2015-08-07 DIAGNOSIS — D509 Iron deficiency anemia, unspecified: Secondary | ICD-10-CM | POA: Diagnosis not present

## 2015-08-07 DIAGNOSIS — N2581 Secondary hyperparathyroidism of renal origin: Secondary | ICD-10-CM | POA: Diagnosis not present

## 2015-08-07 DIAGNOSIS — N186 End stage renal disease: Secondary | ICD-10-CM | POA: Diagnosis not present

## 2015-08-08 ENCOUNTER — Other Ambulatory Visit: Payer: Medicare Other

## 2015-08-08 DIAGNOSIS — B20 Human immunodeficiency virus [HIV] disease: Secondary | ICD-10-CM

## 2015-08-09 ENCOUNTER — Telehealth: Payer: Self-pay | Admitting: *Deleted

## 2015-08-09 DIAGNOSIS — B20 Human immunodeficiency virus [HIV] disease: Secondary | ICD-10-CM | POA: Diagnosis not present

## 2015-08-09 DIAGNOSIS — N186 End stage renal disease: Secondary | ICD-10-CM | POA: Diagnosis not present

## 2015-08-09 DIAGNOSIS — D509 Iron deficiency anemia, unspecified: Secondary | ICD-10-CM | POA: Diagnosis not present

## 2015-08-09 DIAGNOSIS — N2581 Secondary hyperparathyroidism of renal origin: Secondary | ICD-10-CM | POA: Diagnosis not present

## 2015-08-09 LAB — COMPREHENSIVE METABOLIC PANEL
ALT: 36 U/L (ref 9–46)
AST: 51 U/L — ABNORMAL HIGH (ref 10–40)
Albumin: 3.7 g/dL (ref 3.6–5.1)
Alkaline Phosphatase: 72 U/L (ref 40–115)
BUN: 66 mg/dL — ABNORMAL HIGH (ref 7–25)
CHLORIDE: 93 mmol/L — AB (ref 98–110)
CO2: 27 mmol/L (ref 20–31)
CREATININE: 8.48 mg/dL — AB (ref 0.60–1.35)
Calcium: 8.8 mg/dL (ref 8.6–10.3)
Glucose, Bld: 96 mg/dL (ref 65–99)
POTASSIUM: 5.3 mmol/L (ref 3.5–5.3)
SODIUM: 139 mmol/L (ref 135–146)
TOTAL PROTEIN: 6.4 g/dL (ref 6.1–8.1)
Total Bilirubin: 0.6 mg/dL (ref 0.2–1.2)

## 2015-08-09 LAB — HIV-1 RNA QUANT-NO REFLEX-BLD

## 2015-08-09 LAB — T-HELPER CELL (CD4) - (RCID CLINIC ONLY)
CD4 % Helper T Cell: 47 % (ref 33–55)
CD4 T Cell Abs: 640 /uL (ref 400–2700)

## 2015-08-09 NOTE — Telephone Encounter (Signed)
Pt's creatinine up 8.48.  Reported to Dr. Megan Salon.  Pt has had previous elevated creatinine.  ESRD.

## 2015-08-10 ENCOUNTER — Ambulatory Visit (INDEPENDENT_AMBULATORY_CARE_PROVIDER_SITE_OTHER): Payer: Medicare Other | Admitting: Pharmacist

## 2015-08-10 ENCOUNTER — Other Ambulatory Visit: Payer: Self-pay

## 2015-08-10 DIAGNOSIS — Z86718 Personal history of other venous thrombosis and embolism: Secondary | ICD-10-CM

## 2015-08-10 DIAGNOSIS — Z7901 Long term (current) use of anticoagulants: Secondary | ICD-10-CM | POA: Diagnosis present

## 2015-08-10 NOTE — Patient Instructions (Signed)
Continue taking 2 tablets on TRSS and 1 tablet on MWF.  RTC in 4 weeks for follow-up

## 2015-08-11 DIAGNOSIS — D509 Iron deficiency anemia, unspecified: Secondary | ICD-10-CM | POA: Diagnosis not present

## 2015-08-11 DIAGNOSIS — N2581 Secondary hyperparathyroidism of renal origin: Secondary | ICD-10-CM | POA: Diagnosis not present

## 2015-08-11 DIAGNOSIS — N186 End stage renal disease: Secondary | ICD-10-CM | POA: Diagnosis not present

## 2015-08-11 LAB — POCT INR: INR: 2.1

## 2015-08-11 NOTE — Progress Notes (Signed)
Anticoagulation Management Brian [name] is a 39 y.o. male who reports to the clinic for monitoring of warfarin treatment.    Indication: history VTE Duration: indefinite  Anticoagulation Clinic Visit History: Anticoagulation Episode Summary    Current INR goal    Next INR check 08/31/2015   INR from last check 1.6 (07/27/2015)   Most recent INR 2.1 (08/11/2015)   Weekly max dose    Target end date    INR check location    Preferred lab    Send INR reminders to       Comments        ASSESSMENT Recent Results: Recent results are below, the most recent result is correlated with a dose of 55 mg per week: Lab Results  Component Value Date   INR 2.1 08/11/2015   INR 1.6 07/27/2015   INR 1.6 06/27/2015    INR today: Therapeutic  Anticoagulation Dosing: INR as of 08/10/2015 and Previous Dosing Information    INR Dt INR Goal [employer] Sun Mon Tue Wed Thu Fri Sat   07/27/2015 1.6 - 55 mg 10 mg 5 mg 10 mg 5 mg 10 mg 5 mg 10 mg    Previous description        INR on 07/27/2015 was 1.6.  Take 2 tablets on Tuesday, Thursday, Saturday and Sunday.  Then take 1 tablet on Monday, Wednesday and Friday.  Please, return to clinic on 08/08/15 to recheck.    Anticoagulation Dose Instructions as of 08/10/2015      Total Sun Mon Tue Wed Thu Fri Sat   New Dose 55 mg 10 mg 5 mg 10 mg 5 mg 10 mg 5 mg 10 mg     (5 mg x 2)  (5 mg x 1)  (5 mg x 2)  (5 mg x 1)  (5 mg x 2)  (5 mg x 1)  (5 mg x 2)                         Description        INR on 08/10/2015 was 2.1.  Continue to take 2 tablets on Tuesday, Thursday, Saturday and Sunday.  Then take 1 tablet on Monday, Wednesday and Friday.  Please, return to clinic on 08/31/2015 to recheck.      PLAN Weekly dose was unchanged.  Patient Instructions  Continue taking 2 tablets on TRSS and 1 tablet on MWF.  RTC in 4 weeks for follow-up   Follow-up Return in about 4 weeks (around 09/07/2015) for Follow up on 09/07/15 at 2pm.  Litzi Binning  J  15 minutes spent face-to-face with the patient during the encounter. 50% of time spent on education. 50% of time was spent on assessment and plan.

## 2015-08-14 DIAGNOSIS — N186 End stage renal disease: Secondary | ICD-10-CM | POA: Diagnosis not present

## 2015-08-14 DIAGNOSIS — D509 Iron deficiency anemia, unspecified: Secondary | ICD-10-CM | POA: Diagnosis not present

## 2015-08-14 DIAGNOSIS — N2581 Secondary hyperparathyroidism of renal origin: Secondary | ICD-10-CM | POA: Diagnosis not present

## 2015-08-15 LAB — PROTIME-INR

## 2015-08-15 NOTE — Progress Notes (Signed)
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 INR: Below target. Agree with Dr. Julianne Rice assessment and plan.

## 2015-08-16 DIAGNOSIS — D509 Iron deficiency anemia, unspecified: Secondary | ICD-10-CM | POA: Diagnosis not present

## 2015-08-16 DIAGNOSIS — Z992 Dependence on renal dialysis: Secondary | ICD-10-CM | POA: Diagnosis not present

## 2015-08-16 DIAGNOSIS — N2581 Secondary hyperparathyroidism of renal origin: Secondary | ICD-10-CM | POA: Diagnosis not present

## 2015-08-16 DIAGNOSIS — N186 End stage renal disease: Secondary | ICD-10-CM | POA: Diagnosis not present

## 2015-08-16 DIAGNOSIS — I120 Hypertensive chronic kidney disease with stage 5 chronic kidney disease or end stage renal disease: Secondary | ICD-10-CM | POA: Diagnosis not present

## 2015-08-18 DIAGNOSIS — N186 End stage renal disease: Secondary | ICD-10-CM | POA: Diagnosis not present

## 2015-08-18 DIAGNOSIS — N2581 Secondary hyperparathyroidism of renal origin: Secondary | ICD-10-CM | POA: Diagnosis not present

## 2015-08-18 DIAGNOSIS — D509 Iron deficiency anemia, unspecified: Secondary | ICD-10-CM | POA: Diagnosis not present

## 2015-08-21 DIAGNOSIS — N2581 Secondary hyperparathyroidism of renal origin: Secondary | ICD-10-CM | POA: Diagnosis not present

## 2015-08-21 DIAGNOSIS — D509 Iron deficiency anemia, unspecified: Secondary | ICD-10-CM | POA: Diagnosis not present

## 2015-08-21 DIAGNOSIS — N186 End stage renal disease: Secondary | ICD-10-CM | POA: Diagnosis not present

## 2015-08-22 ENCOUNTER — Other Ambulatory Visit: Payer: Self-pay | Admitting: Internal Medicine

## 2015-08-23 DIAGNOSIS — N2581 Secondary hyperparathyroidism of renal origin: Secondary | ICD-10-CM | POA: Diagnosis not present

## 2015-08-23 DIAGNOSIS — N186 End stage renal disease: Secondary | ICD-10-CM | POA: Diagnosis not present

## 2015-08-23 DIAGNOSIS — D509 Iron deficiency anemia, unspecified: Secondary | ICD-10-CM | POA: Diagnosis not present

## 2015-08-24 ENCOUNTER — Encounter: Payer: Self-pay | Admitting: Internal Medicine

## 2015-08-24 ENCOUNTER — Ambulatory Visit (INDEPENDENT_AMBULATORY_CARE_PROVIDER_SITE_OTHER): Payer: Medicare Other | Admitting: Internal Medicine

## 2015-08-24 ENCOUNTER — Ambulatory Visit
Admission: RE | Admit: 2015-08-24 | Discharge: 2015-08-24 | Disposition: A | Discharge status: Home / Self Care | Payer: Medicare Other | Source: Ambulatory Visit | Attending: Internal Medicine | Admitting: Internal Medicine

## 2015-08-24 ENCOUNTER — Other Ambulatory Visit: Payer: Self-pay | Admitting: Internal Medicine

## 2015-08-24 VITALS — BP 109/77 | HR 116 | Temp 97.5°F | Wt 172.0 lb

## 2015-08-24 DIAGNOSIS — Z113 Encounter for screening for infections with a predominantly sexual mode of transmission: Secondary | ICD-10-CM | POA: Diagnosis not present

## 2015-08-24 DIAGNOSIS — B20 Human immunodeficiency virus [HIV] disease: Secondary | ICD-10-CM | POA: Diagnosis present

## 2015-08-24 DIAGNOSIS — F329 Major depressive disorder, single episode, unspecified: Secondary | ICD-10-CM | POA: Diagnosis not present

## 2015-08-24 DIAGNOSIS — F32A Depression, unspecified: Secondary | ICD-10-CM

## 2015-08-24 DIAGNOSIS — A318 Other mycobacterial infections: Secondary | ICD-10-CM | POA: Diagnosis not present

## 2015-08-24 DIAGNOSIS — Z23 Encounter for immunization: Secondary | ICD-10-CM | POA: Diagnosis not present

## 2015-08-24 DIAGNOSIS — I517 Cardiomegaly: Secondary | ICD-10-CM | POA: Diagnosis not present

## 2015-08-24 NOTE — Assessment & Plan Note (Signed)
Stable, no si.

## 2015-08-24 NOTE — Telephone Encounter (Signed)
Called to pharm 

## 2015-08-24 NOTE — Assessment & Plan Note (Signed)
Will check a CXR to be sure not returning.

## 2015-08-24 NOTE — Assessment & Plan Note (Signed)
Doing well, was inadvertently given AZT but did not take and does not need.  Will continue with the same and rtc 4 months.

## 2015-08-24 NOTE — Progress Notes (Signed)
   Subjective:    Patient ID: ANSUMANA GENS, male    DOB: [birth year], 39 y.o.   MRN: [id number]  HPI Here for follow-up of HIV. He is on a regimen of lamivudine, Sustiva and abacavir due to his end-stage renal disease. He is on dialysis. Denies any missed doses.  No new issues.  No weight loss, no diarrhea.  CD4 640, viral load remains undetectable. Some recent increase in cough and concerned with reoccurrence of M fortuitum.     Review of Systems  Constitutional: Negative for fatigue and unexpected weight change.  Respiratory: Negative for cough and shortness of breath.   Gastrointestinal: Negative for diarrhea.  Skin: Negative for rash.  Neurological: Negative for dizziness and light-headedness.       Objective:   Physical Exam  Constitutional: He appears well-developed and well-nourished. No distress.  HENT:  Mouth/Throat: No oropharyngeal exudate.  Eyes: No scleral icterus.  Cardiovascular: Normal rate, regular rhythm and normal heart sounds.   No murmur heard. Pulmonary/Chest: Effort normal and breath sounds normal. No respiratory distress.  Lymphadenopathy:    He has no cervical adenopathy.  Skin: No rash noted.          Assessment & Plan:

## 2015-08-25 DIAGNOSIS — D509 Iron deficiency anemia, unspecified: Secondary | ICD-10-CM | POA: Diagnosis not present

## 2015-08-25 DIAGNOSIS — N2581 Secondary hyperparathyroidism of renal origin: Secondary | ICD-10-CM | POA: Diagnosis not present

## 2015-08-25 DIAGNOSIS — N186 End stage renal disease: Secondary | ICD-10-CM | POA: Diagnosis not present

## 2015-08-28 DIAGNOSIS — D509 Iron deficiency anemia, unspecified: Secondary | ICD-10-CM | POA: Diagnosis not present

## 2015-08-28 DIAGNOSIS — N186 End stage renal disease: Secondary | ICD-10-CM | POA: Diagnosis not present

## 2015-08-28 DIAGNOSIS — N2581 Secondary hyperparathyroidism of renal origin: Secondary | ICD-10-CM | POA: Diagnosis not present

## 2015-08-30 DIAGNOSIS — N2581 Secondary hyperparathyroidism of renal origin: Secondary | ICD-10-CM | POA: Diagnosis not present

## 2015-08-30 DIAGNOSIS — N186 End stage renal disease: Secondary | ICD-10-CM | POA: Diagnosis not present

## 2015-08-30 DIAGNOSIS — D509 Iron deficiency anemia, unspecified: Secondary | ICD-10-CM | POA: Diagnosis not present

## 2015-08-30 DIAGNOSIS — I8291 Chronic embolism and thrombosis of unspecified vein: Secondary | ICD-10-CM | POA: Diagnosis not present

## 2015-09-01 ENCOUNTER — Other Ambulatory Visit: Payer: Self-pay | Admitting: Internal Medicine

## 2015-09-01 DIAGNOSIS — N2581 Secondary hyperparathyroidism of renal origin: Secondary | ICD-10-CM | POA: Diagnosis not present

## 2015-09-01 DIAGNOSIS — N186 End stage renal disease: Secondary | ICD-10-CM | POA: Diagnosis not present

## 2015-09-01 DIAGNOSIS — D509 Iron deficiency anemia, unspecified: Secondary | ICD-10-CM | POA: Diagnosis not present

## 2015-09-01 NOTE — Telephone Encounter (Addendum)
Pt called requesting percocet  to be filled.

## 2015-09-02 ENCOUNTER — Other Ambulatory Visit: Payer: Self-pay | Admitting: Internal Medicine

## 2015-09-04 ENCOUNTER — Other Ambulatory Visit: Payer: Self-pay | Admitting: Internal Medicine

## 2015-09-04 DIAGNOSIS — N186 End stage renal disease: Secondary | ICD-10-CM | POA: Diagnosis not present

## 2015-09-04 DIAGNOSIS — N2581 Secondary hyperparathyroidism of renal origin: Secondary | ICD-10-CM | POA: Diagnosis not present

## 2015-09-04 DIAGNOSIS — D509 Iron deficiency anemia, unspecified: Secondary | ICD-10-CM | POA: Diagnosis not present

## 2015-09-04 MED ORDER — OXYCODONE-ACETAMINOPHEN 10-325 MG PO TABS: 1.0000 | ORAL_TABLET | ORAL | Status: DC | PRN

## 2015-09-05 ENCOUNTER — Telehealth: Payer: Self-pay | Admitting: *Deleted

## 2015-09-05 NOTE — Telephone Encounter (Signed)
When pt picked up script and was asked to schedule an appt it was noted that dr Melburn Hake has his clinic days on mondays, pt is at dialysis on mondays and needs to be scheduled another day, charsetta worked with pt and changed pt pcp to dr Marijean Bravo due to his clinics being thursdays

## 2015-09-05 NOTE — Telephone Encounter (Signed)
Pt aware Rx is ready. 

## 2015-09-06 DIAGNOSIS — N186 End stage renal disease: Secondary | ICD-10-CM | POA: Diagnosis not present

## 2015-09-06 DIAGNOSIS — N2581 Secondary hyperparathyroidism of renal origin: Secondary | ICD-10-CM | POA: Diagnosis not present

## 2015-09-06 DIAGNOSIS — D509 Iron deficiency anemia, unspecified: Secondary | ICD-10-CM | POA: Diagnosis not present

## 2015-09-07 ENCOUNTER — Ambulatory Visit (INDEPENDENT_AMBULATORY_CARE_PROVIDER_SITE_OTHER): Payer: Medicare Other | Admitting: Pharmacist

## 2015-09-07 DIAGNOSIS — Z86718 Personal history of other venous thrombosis and embolism: Secondary | ICD-10-CM | POA: Diagnosis present

## 2015-09-07 DIAGNOSIS — Z86711 Personal history of pulmonary embolism: Secondary | ICD-10-CM

## 2015-09-07 LAB — POCT INR: INR: 1.4

## 2015-09-07 NOTE — Progress Notes (Signed)
Anticoagulation Management Brian [name] is a 39 y.o. male who reports to the clinic for monitoring of warfarin treatment.    Indication: DVTand PE history Duration: indefinite  Anticoagulation Clinic Visit History: Anticoagulation Episode Summary    Current INR goal    Next INR check 09/21/2015   INR from last check 1.4 (09/07/2015)   Weekly max dose    Target end date    INR check location    Preferred lab    Send INR reminders to       Comments        ASSESSMENT Recent Results: Recent results are below, the most recent result is correlated with a dose of 55 mg per week: Lab Results  Component Value Date   INR 1.4 09/07/2015   INR 2.1 08/11/2015   INR 1.6 07/27/2015   INR today: Subtherapeutic  Anticoagulation Dosing: INR as of 09/07/2015 and Previous Dosing Information    INR Dt INR Goal [employer] Sun Mon Tue Wed Thu Fri Sat   09/07/2015 1.4 - 55 mg 10 mg 5 mg 10 mg 5 mg 10 mg 5 mg 10 mg   Patient deviated from recommended dosing.       Previous description        INR on 08/10/2015 was 2.1.  Continue to take 2 tablets on Tuesday, Thursday, Saturday and Sunday.  Then take 1 tablet on Monday, Wednesday and Friday.  Please, return to clinic on 08/31/2015 to recheck.    Anticoagulation Dose Instructions as of 09/07/2015      Total Sun Mon Tue Wed Thu Fri Sat   New Dose 55 mg 10 mg 5 mg 10 mg 5 mg 10 mg 5 mg 10 mg     (5 mg x 2)  (5 mg x 1)  (5 mg x 2)  (5 mg x 1)  (5 mg x 2)  (5 mg x 1)  (5 mg x 2)                         Description        Patient states he missed 1 dose a few days ago.      PLAN Weekly dose was unchanged and importance of medication adherence was reinforced.  Patient Instructions  Patient educated about medication as defined in this encounter and verbalized understanding by repeating back instructions provided.     Follow-up Return in about 2 weeks (around 09/21/2015) for Follow up INR 09/21/15 at 2pm.  Kim,Jennifer J  15 minutes  spent face-to-face with the patient during the encounter. 50% of time spent on education. 50% of time was spent on assessment and plan.

## 2015-09-07 NOTE — Patient Instructions (Signed)
Patient educated about medication as defined in this encounter and verbalized understanding by repeating back instructions provided.   

## 2015-09-08 DIAGNOSIS — N2581 Secondary hyperparathyroidism of renal origin: Secondary | ICD-10-CM | POA: Diagnosis not present

## 2015-09-08 DIAGNOSIS — D509 Iron deficiency anemia, unspecified: Secondary | ICD-10-CM | POA: Diagnosis not present

## 2015-09-08 DIAGNOSIS — N186 End stage renal disease: Secondary | ICD-10-CM | POA: Diagnosis not present

## 2015-09-11 ENCOUNTER — Encounter: Payer: Self-pay | Admitting: Internal Medicine

## 2015-09-11 DIAGNOSIS — D509 Iron deficiency anemia, unspecified: Secondary | ICD-10-CM | POA: Diagnosis not present

## 2015-09-11 DIAGNOSIS — N186 End stage renal disease: Secondary | ICD-10-CM | POA: Diagnosis not present

## 2015-09-11 DIAGNOSIS — N2581 Secondary hyperparathyroidism of renal origin: Secondary | ICD-10-CM | POA: Diagnosis not present

## 2015-09-13 DIAGNOSIS — D509 Iron deficiency anemia, unspecified: Secondary | ICD-10-CM | POA: Diagnosis not present

## 2015-09-13 DIAGNOSIS — N186 End stage renal disease: Secondary | ICD-10-CM | POA: Diagnosis not present

## 2015-09-13 DIAGNOSIS — N2581 Secondary hyperparathyroidism of renal origin: Secondary | ICD-10-CM | POA: Diagnosis not present

## 2015-09-15 DIAGNOSIS — N2581 Secondary hyperparathyroidism of renal origin: Secondary | ICD-10-CM | POA: Diagnosis not present

## 2015-09-15 DIAGNOSIS — D509 Iron deficiency anemia, unspecified: Secondary | ICD-10-CM | POA: Diagnosis not present

## 2015-09-15 DIAGNOSIS — I120 Hypertensive chronic kidney disease with stage 5 chronic kidney disease or end stage renal disease: Secondary | ICD-10-CM | POA: Diagnosis not present

## 2015-09-15 DIAGNOSIS — Z992 Dependence on renal dialysis: Secondary | ICD-10-CM | POA: Diagnosis not present

## 2015-09-15 DIAGNOSIS — N186 End stage renal disease: Secondary | ICD-10-CM | POA: Diagnosis not present

## 2015-09-18 DIAGNOSIS — D509 Iron deficiency anemia, unspecified: Secondary | ICD-10-CM | POA: Diagnosis not present

## 2015-09-18 DIAGNOSIS — N2581 Secondary hyperparathyroidism of renal origin: Secondary | ICD-10-CM | POA: Diagnosis not present

## 2015-09-18 DIAGNOSIS — N186 End stage renal disease: Secondary | ICD-10-CM | POA: Diagnosis not present

## 2015-09-19 ENCOUNTER — Other Ambulatory Visit: Payer: Self-pay | Admitting: Internal Medicine

## 2015-09-19 ENCOUNTER — Other Ambulatory Visit: Payer: Self-pay | Admitting: Cardiology

## 2015-09-19 DIAGNOSIS — M62838 Other muscle spasm: Secondary | ICD-10-CM

## 2015-09-19 DIAGNOSIS — Z7901 Long term (current) use of anticoagulants: Secondary | ICD-10-CM

## 2015-09-20 DIAGNOSIS — N2581 Secondary hyperparathyroidism of renal origin: Secondary | ICD-10-CM | POA: Diagnosis not present

## 2015-09-20 DIAGNOSIS — N186 End stage renal disease: Secondary | ICD-10-CM | POA: Diagnosis not present

## 2015-09-20 DIAGNOSIS — D509 Iron deficiency anemia, unspecified: Secondary | ICD-10-CM | POA: Diagnosis not present

## 2015-09-21 ENCOUNTER — Ambulatory Visit: Payer: Self-pay | Admitting: Pharmacist

## 2015-09-22 DIAGNOSIS — N186 End stage renal disease: Secondary | ICD-10-CM | POA: Diagnosis not present

## 2015-09-22 DIAGNOSIS — D509 Iron deficiency anemia, unspecified: Secondary | ICD-10-CM | POA: Diagnosis not present

## 2015-09-22 DIAGNOSIS — N2581 Secondary hyperparathyroidism of renal origin: Secondary | ICD-10-CM | POA: Diagnosis not present

## 2015-09-25 DIAGNOSIS — N186 End stage renal disease: Secondary | ICD-10-CM | POA: Diagnosis not present

## 2015-09-25 DIAGNOSIS — D509 Iron deficiency anemia, unspecified: Secondary | ICD-10-CM | POA: Diagnosis not present

## 2015-09-25 DIAGNOSIS — N2581 Secondary hyperparathyroidism of renal origin: Secondary | ICD-10-CM | POA: Diagnosis not present

## 2015-09-27 DIAGNOSIS — N2581 Secondary hyperparathyroidism of renal origin: Secondary | ICD-10-CM | POA: Diagnosis not present

## 2015-09-27 DIAGNOSIS — D509 Iron deficiency anemia, unspecified: Secondary | ICD-10-CM | POA: Diagnosis not present

## 2015-09-27 DIAGNOSIS — N186 End stage renal disease: Secondary | ICD-10-CM | POA: Diagnosis not present

## 2015-09-28 ENCOUNTER — Ambulatory Visit (INDEPENDENT_AMBULATORY_CARE_PROVIDER_SITE_OTHER): Payer: Medicare Other | Admitting: Internal Medicine

## 2015-09-28 ENCOUNTER — Encounter: Payer: Self-pay | Admitting: Internal Medicine

## 2015-09-28 ENCOUNTER — Ambulatory Visit: Payer: Medicare Other | Admitting: Pharmacist

## 2015-09-28 VITALS — BP 93/48 | HR 90 | Temp 97.6°F | Ht 74.0 in | Wt 177.6 lb

## 2015-09-28 DIAGNOSIS — Z86711 Personal history of pulmonary embolism: Secondary | ICD-10-CM

## 2015-09-28 DIAGNOSIS — Q613 Polycystic kidney, unspecified: Secondary | ICD-10-CM | POA: Diagnosis not present

## 2015-09-28 DIAGNOSIS — G4700 Insomnia, unspecified: Secondary | ICD-10-CM | POA: Diagnosis not present

## 2015-09-28 DIAGNOSIS — F17210 Nicotine dependence, cigarettes, uncomplicated: Secondary | ICD-10-CM | POA: Diagnosis not present

## 2015-09-28 DIAGNOSIS — G2581 Restless legs syndrome: Secondary | ICD-10-CM | POA: Diagnosis not present

## 2015-09-28 DIAGNOSIS — Z86718 Personal history of other venous thrombosis and embolism: Secondary | ICD-10-CM | POA: Insufficient documentation

## 2015-09-28 DIAGNOSIS — Z7901 Long term (current) use of anticoagulants: Secondary | ICD-10-CM

## 2015-09-28 DIAGNOSIS — F17200 Nicotine dependence, unspecified, uncomplicated: Secondary | ICD-10-CM

## 2015-09-28 DIAGNOSIS — I959 Hypotension, unspecified: Secondary | ICD-10-CM | POA: Insufficient documentation

## 2015-09-28 DIAGNOSIS — I953 Hypotension of hemodialysis: Secondary | ICD-10-CM

## 2015-09-28 LAB — POCT INR: INR: 1.5

## 2015-09-28 MED ORDER — OXYCODONE-ACETAMINOPHEN 10-325 MG PO TABS
1.0000 | ORAL_TABLET | ORAL | Status: DC | PRN
Start: 2015-10-29 — End: 2015-11-30

## 2015-09-28 MED ORDER — OXYCODONE-ACETAMINOPHEN 10-325 MG PO TABS
1.0000 | ORAL_TABLET | ORAL | Status: DC | PRN
Start: 2015-09-28 — End: 2015-09-28

## 2015-09-28 MED ORDER — NICOTINE 14 MG/24HR TD PT24: 14.0000 mg | MEDICATED_PATCH | TRANSDERMAL | Status: DC

## 2015-09-28 MED ORDER — NICOTINE POLACRILEX 4 MG MT GUM: 4.0000 mg | CHEWING_GUM | OROMUCOSAL | Status: DC | PRN

## 2015-09-28 MED ORDER — PRAMIPEXOLE DIHYDROCHLORIDE 0.125 MG PO TABS: 0.1250 mg | ORAL_TABLET | Freq: Every day | ORAL | Status: DC

## 2015-09-28 MED ORDER — CLONAZEPAM 0.5 MG PO TABS: 0.5000 mg | ORAL_TABLET | Freq: Every day | ORAL | Status: DC

## 2015-09-28 NOTE — Assessment & Plan Note (Signed)
Patient is on chronic opioid therapy for pain associated with this condition for years , Percocet 10-325  q4h prn pain. While he has had UDS in the past positive for THC, there has been no evidence of medication diversion. He has not requested more than his current dose. Brian [name] says chronic opioid therapy enables him to live a more functional life.  - Will refill Percocet 10-325 - Consider weaning if hypotension continues to an issue

## 2015-09-28 NOTE — Progress Notes (Signed)
Anticoagulation Management Brian [name] is a 39 y.o. male who reports to the clinic for monitoring of warfarin treatment.    Indication: Hx of PE and DVT Duration: indefinite  Anticoagulation Clinic Visit History: Anticoagulation Episode Summary    Current INR goal    Next INR check 10/05/2015   INR from last check 1.5 (09/28/2015)   Weekly max dose    Target end date    INR check location    Preferred lab    Send INR reminders to       Comments        ASSESSMENT Recent Results: Recent results are below, the most recent result is correlated with a dose of 62.5 mg per week: Lab Results  Component Value Date   INR 1.5 09/28/2015   INR 1.4 09/07/2015   INR 2.1 08/11/2015  Pt states 1.5 tabs MWF, 2 tabs AOD (TWD 62.5 mg). Clinic prescribed dose was 55 mg total weekly dose. Pt states he may have missed a dose 3 days ago also. Pt states he is receiving a new medication during dialysis but is unsure of the name.  Pt also states that he may have missed a dose 2-3 days ago but is unsure if he missed one.   INR today: Subtherapeutic  Anticoagulation Dosing: INR as of 09/28/2015 and Previous Dosing Information    INR Dt INR Goal Brian [name] Sun Mon Tue Wed Thu Fri Sat   09/28/2015 1.5 - 55 mg 10 mg 5 mg 10 mg 5 mg 10 mg 5 mg 10 mg    Previous description        Patient states he missed 1 dose a few days ago.    Anticoagulation Dose Instructions as of 09/28/2015      Total Sun Mon Tue Wed Thu Fri Sat   New Dose 65 mg 10 mg 7.5 mg 10 mg 7.5 mg 10 mg 10 mg 10 mg     (5 mg x 2)  (5 mg x 1.5)  (5 mg x 2)  (5 mg x 1.5)  (5 mg x 2)  (5 mg x 2)  (5 mg x 2)                         Description        Increase Warfarin to 1.5 tabs Mon and Wed, and 2 tabs all other days       PLAN Pt states was taking 7.5 mg MWF, 10 mg AOD (TWD 62.5 mg) and last clinic dose was 5 mg MWF and 10 mg AOD.  Will increase warfarin to 7.5 mg MW and 10 mg AOD (66 mg TWD).  This would correlate with a  20% increase from last clinic known warfarin dose of 55 mg total weekly since INR is subtherapeutic. Will f/u with pt at next visit about new medication he is receiving with dialysis sessions.   Patient Instructions  Provided pt with Warfarin dose instructions Pt was able to verbalize understanding.  Brian [name] states he will ask tomorrow about the new medication he receives during dialysis sessions.    Follow-up Return in about 1 week (around 10/05/2015) for f/u INR 10/20 at 3 pm .  Bennye Alm, PharmD Pharmacy Resident [phone]  30 minutes spent face-to-face with the patient during the encounter. 50% of time spent on education. 50% of time was spent on assessment and plan .

## 2015-09-28 NOTE — Patient Instructions (Signed)
Provided pt with Warfarin dose instructions Pt was able to verbalize understanding.  Mr Brian [name] states he will ask tomorrow about the new medication he receives during dialysis sessions.

## 2015-09-28 NOTE — Assessment & Plan Note (Signed)
He has tried patches before, but never with nicotine gum with breakthrough cravings. He is ready to quit. - 14 mg nicotine patch daily - Nicotine gum prn breakthrough cravings - Set quit date for October 20th

## 2015-09-28 NOTE — Assessment & Plan Note (Addendum)
It seems that Brian [name]'s insomnia is driven primarily by poor sleep hygiene with a possible component of restless leg syndrome. It seems he has become tolerant to his current GABAergic medications.  - Counseled >15 minutes on better sleeping habits (see "patient instructions") - Begin treating restless leg syndrome (see "restless leg syndrome") - Wean clonazepam to 0.5 mg qhs - Discontinue zolpidem - Consider starting Suvorexant if sleep hygiene adjustments are ineffective. He has already failed trazodone and zolpidem, so Medicare should cover this. No dosing adjustments are required for renal impairment, as it is hepatically cleared. He has a history of polycystic liver with occasionally mildly elevated LFTs, although no dosing adjustments are necessary for moderate hepatic impairment.

## 2015-09-28 NOTE — Patient Instructions (Signed)
Brian [name], it was a pleasure meeting you today.   For your hypotension, I have called your dialysis center to let them you've been having these episodes and that they may need to adjust your dry weight or rate of dialysis. Your hypotension may also be related to the medications you take, such as oxycodone, klonopin, ambien, and cyclobenzaprine.   For your pain due to your polycystic kidneys, I've provided two months supply of oxycodone.  For insomnia, we've discussed ways to improve your sleep hygiene. Please try to avoid watching television 1-2 hours before bedtime. Avoid falling asleep on the couch while watching TV. Instead, do a mindless activity, such as doodling, in your bed until you drift off to sleep. Do not lie awake in bed for a long period of time, because over time your brain will start to associate the bed with being awake.  It sounds like you have restless leg syndrome based on your symptoms, so we will prescribe pramipexole. Please follow the dosing regimen below: 1. 0.125 mg (1 tablet) two hours before bed for one week 2. 0.25 mg (2 tablets) for the second week 3. 0.375 mg (3 tablets) for the third week 4. 0.5 mg (4 tablets) starting the fourth week.   Do not abruptly stop pramipexole if you've been taking it a while, as this can worsen your restless leg syndrome.  For your smoking, we have provided nicotine patches. You can place 14 mg patch once per day. We've also provided nicotine gum for breakthrough cravings. Have these ready for times when you normally smoke cigarettes. We've agreed that October 20 th is your quit date.  Have a great day. I'm excited to be your doctor this year.  Insomnia Insomnia is a sleep disorder that makes it difficult to fall asleep or to stay asleep. Insomnia can cause tiredness (fatigue), low energy, difficulty concentrating, mood swings, and poor performance at work or school.  There are three different ways to classify insomnia:  Difficulty  falling asleep.  Difficulty staying asleep.  Waking up too early in the morning. Any type of insomnia can be long-term (chronic) or short-term (acute). Both are common. Short-term insomnia usually lasts for three months or less. Chronic insomnia occurs at least three times a week for longer than three months. CAUSES  Insomnia may be caused by another condition, situation, or substance, such as:  Anxiety.  Certain medicines.  Gastroesophageal reflux disease (GERD) or other gastrointestinal conditions.  Asthma or other breathing conditions.  Restless legs syndrome, sleep apnea, or other sleep disorders.  Chronic pain.  Menopause. This may include hot flashes.  Stroke.  Abuse of alcohol, tobacco, or illegal drugs.  Depression.  Caffeine.   Neurological disorders, such as Alzheimer disease.  An overactive thyroid (hyperthyroidism). The cause of insomnia may not be known. RISK FACTORS Risk factors for insomnia include:  Gender. Women are more commonly affected than men.  Age. Insomnia is more common as you get older.  Stress. This may involve your professional or personal life.  Income. Insomnia is more common in people with lower income.  Lack of exercise.   Irregular work schedule or night shifts.  Traveling between different time zones. SIGNS AND SYMPTOMS If you have insomnia, trouble falling asleep or trouble staying asleep is the main symptom. This may lead to other symptoms, such as:  Feeling fatigued.  Feeling nervous about going to sleep.  Not feeling rested in the morning.  Having trouble concentrating.  Feeling irritable, anxious, or depressed.  TREATMENT  Treatment for insomnia depends on the cause. If your insomnia is caused by an underlying condition, treatment will focus on addressing the condition. Treatment may also include:   Medicines to help you sleep.  Counseling or therapy.  Lifestyle adjustments. HOME CARE INSTRUCTIONS   Take  medicines only as directed by your health care provider.  Keep regular sleeping and waking hours. Avoid naps.  Keep a sleep diary to help you and your health care provider figure out what could be causing your insomnia. Include:   When you sleep.  When you wake up during the night.  How well you sleep.   How rested you feel the next day.  Any side effects of medicines you are taking.  What you eat and drink.   Make your bedroom a comfortable place where it is easy to fall asleep:  Put up shades or special blackout curtains to block light from outside.  Use a white noise machine to block noise.  Keep the temperature cool.   Exercise regularly as directed by your health care provider. Avoid exercising right before bedtime.  Use relaxation techniques to manage stress. Ask your health care provider to suggest some techniques that may work well for you. These may include:  Breathing exercises.  Routines to release muscle tension.  Visualizing peaceful scenes.  Cut back on alcohol, caffeinated beverages, and cigarettes, especially close to bedtime. These can disrupt your sleep.  Do not overeat or eat spicy foods right before bedtime. This can lead to digestive discomfort that can make it hard for you to sleep.  Limit screen use before bedtime. This includes:  Watching TV.  Using your smartphone, tablet, and computer.  Stick to a routine. This can help you fall asleep faster. Try to do a quiet activity, brush your teeth, and go to bed at the same time each night.  Get out of bed if you are still awake after 15 minutes of trying to sleep. Keep the lights down, but try reading or doing a quiet activity. When you feel sleepy, go back to bed.  Make sure that you drive carefully. Avoid driving if you feel very sleepy.  Keep all follow-up appointments as directed by your health care provider. This is important. SEEK MEDICAL CARE IF:   You are tired throughout the day or  have trouble in your daily routine due to sleepiness.  You continue to have sleep problems or your sleep problems get worse. SEEK IMMEDIATE MEDICAL CARE IF:   You have serious thoughts about hurting yourself or someone else.   This information is not intended to replace advice given to you by your health care provider. Make sure you discuss any questions you have with your health care provider.   Document Released: 11/29/2000 Document Revised: 08/23/2015 Document Reviewed: 09/02/2014 Elsevier Interactive Patient Education [employer].

## 2015-09-28 NOTE — Assessment & Plan Note (Signed)
Brian [name]'s description of his leg movements at night are consistent with RLS. It is unclear if he's been appropriately treated for this in the past. - Start pramipexole 0.125 mg two hours before bedtime, tapering up by 0.125 mg each week until reaching 0.5 mg nightly - Advised not to discontinue abruption to avoid dopamine agonist withdrawal syndrome

## 2015-09-28 NOTE — Progress Notes (Signed)
Subjective:    Patient ID: Brian [name], male    DOB: [birth year], 39 y.o.   MRN: [id number]  HPI  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  [name] currently takes 5 mg zolpidem nightly to initiate sleep as well as 1 mg klonopin for "nerves." He reports that the zolpidem has worked for a while, but now he does not feel that works. Currently, his sleep routine is to watch TV on the couch until he dozes off. Then he wakes himself up to go to bed. However, he lies awake in bed for hours at a time and cannot doze off again. Notably, he says the zolpidem is also for his "restless legs," and prior to taking zolpidem his legs would shake in bed. He does not take any caffeinated beverages or energy drinks after 12 pm.  He reports several episodes of feeling light-headed this past month. They are worse toward the end of this dialysis sessions, and sometimes feels as if he will pass out. He says the light-headedness is worse upon standing. He has not had any falls yet, but he thinks it might be "possible." He believes the light-headedness is due to the dialysis center persistently lowering his dry weight.  He reports that his longstanding prescription of Percocet 10-325 q4h as needed for pain from his ADPKD has been helpful and improves his ability to function throughout the day. He says he has no desire the increase his dose.  His tobacco use is currently 4-5 cigarettes a day. He used to smoke 2.5 packs per day. He has tried patches before, but never patches and gum together. He is interested in quitting.    Review of Systems  Constitutional: Negative for fever, appetite change and fatigue.  HENT: Negative for rhinorrhea, sinus pressure and sore throat.   Eyes: Negative for visual disturbance.  Respiratory:         Shortness of breath on exertion  Cardiovascular: Negative for chest pain and leg swelling.  Gastrointestinal: Negative for vomiting, diarrhea and rectal pain.  Endocrine: Negative for cold intolerance.  Genitourinary:       Urinates "1 cup" a day  Musculoskeletal: Positive for back pain. Negative for arthralgias and gait problem.  Skin: Negative for rash.  Neurological: Positive for light-headedness. Negative for tremors and syncope.  Psychiatric/Behavioral: Positive for sleep disturbance. Negative for dysphoric mood.       Objective:   Physical Exam  Constitutional: No distress.  HENT:  Mouth/Throat: No oropharyngeal exudate.  Edentulous. Dry mucous membranes.  Eyes: EOM are normal. Pupils are equal, round, and reactive to light. No scleral icterus.  Neck: No thyromegaly present.  Cardiovascular: Intact distal pulses.   Continuous murmur. Palpable thrill at right UE AVF.  Pulmonary/Chest: Effort normal and breath sounds normal. No respiratory distress.  Abdominal: Soft. Bowel sounds are normal.  Two slightly tender periumbilical hernias.  Musculoskeletal: He exhibits no edema.  No atrophy. Pain on palpation of right flank.  Lymphadenopathy:    He has no cervical adenopathy.  Neurological: He is alert. He has normal reflexes.  Skin: Skin is warm and dry.  Psychiatric: He has a normal mood and affect.          Assessment & Plan:  Please see problem based assessment and plan for  details.

## 2015-09-28 NOTE — Assessment & Plan Note (Addendum)
He describes episodes of symptomatic hypotension. Blood pressure is 90/48 today with pulse of 90. Could be related to polypharmacy, overly aggressive dialysis, or a combination. He is able to sit and stand with only minimal symptoms today, so I do not think he needs to be given fluids at this time, especially in setting of ESRD. - Called dialysis center [facility] on [address]) to inform them of Brian [name]'s symptoms - Begin tapering agents that might be causing hypotension, starting with clonazepam (see "Insomnia")

## 2015-09-29 DIAGNOSIS — N186 End stage renal disease: Secondary | ICD-10-CM | POA: Diagnosis not present

## 2015-09-29 DIAGNOSIS — N2581 Secondary hyperparathyroidism of renal origin: Secondary | ICD-10-CM | POA: Diagnosis not present

## 2015-09-29 DIAGNOSIS — D509 Iron deficiency anemia, unspecified: Secondary | ICD-10-CM | POA: Diagnosis not present

## 2015-09-29 DIAGNOSIS — I8291 Chronic embolism and thrombosis of unspecified vein: Secondary | ICD-10-CM | POA: Diagnosis not present

## 2015-10-02 DIAGNOSIS — N186 End stage renal disease: Secondary | ICD-10-CM | POA: Diagnosis not present

## 2015-10-02 DIAGNOSIS — D509 Iron deficiency anemia, unspecified: Secondary | ICD-10-CM | POA: Diagnosis not present

## 2015-10-02 DIAGNOSIS — N2581 Secondary hyperparathyroidism of renal origin: Secondary | ICD-10-CM | POA: Diagnosis not present

## 2015-10-02 NOTE — Progress Notes (Signed)
Internal Medicine Clinic Attending  I saw and evaluated the patient.  I personally confirmed the key portions of the history and exam documented by Dr. Ford and I reviewed pertinent patient test results.  The assessment, diagnosis, and plan were formulated together and I agree with the documentation in the resident's note. 

## 2015-10-04 DIAGNOSIS — N186 End stage renal disease: Secondary | ICD-10-CM | POA: Diagnosis not present

## 2015-10-04 DIAGNOSIS — D509 Iron deficiency anemia, unspecified: Secondary | ICD-10-CM | POA: Diagnosis not present

## 2015-10-04 DIAGNOSIS — N2581 Secondary hyperparathyroidism of renal origin: Secondary | ICD-10-CM | POA: Diagnosis not present

## 2015-10-04 NOTE — Progress Notes (Signed)
I agree with the assessment and plan of care documented by Dr. Zara Council.

## 2015-10-05 ENCOUNTER — Ambulatory Visit: Payer: Self-pay

## 2015-10-06 ENCOUNTER — Telehealth: Payer: Self-pay | Admitting: Pharmacist

## 2015-10-06 DIAGNOSIS — N2581 Secondary hyperparathyroidism of renal origin: Secondary | ICD-10-CM | POA: Diagnosis not present

## 2015-10-06 DIAGNOSIS — D509 Iron deficiency anemia, unspecified: Secondary | ICD-10-CM | POA: Diagnosis not present

## 2015-10-06 DIAGNOSIS — N186 End stage renal disease: Secondary | ICD-10-CM | POA: Diagnosis not present

## 2015-10-06 NOTE — Telephone Encounter (Signed)
Pt missed Anticoag apt on 10/20.  Left message for pt to call clinic and reschedule

## 2015-10-09 DIAGNOSIS — N2581 Secondary hyperparathyroidism of renal origin: Secondary | ICD-10-CM | POA: Diagnosis not present

## 2015-10-09 DIAGNOSIS — D509 Iron deficiency anemia, unspecified: Secondary | ICD-10-CM | POA: Diagnosis not present

## 2015-10-09 DIAGNOSIS — N186 End stage renal disease: Secondary | ICD-10-CM | POA: Diagnosis not present

## 2015-10-11 DIAGNOSIS — D509 Iron deficiency anemia, unspecified: Secondary | ICD-10-CM | POA: Diagnosis not present

## 2015-10-11 DIAGNOSIS — N2581 Secondary hyperparathyroidism of renal origin: Secondary | ICD-10-CM | POA: Diagnosis not present

## 2015-10-11 DIAGNOSIS — N186 End stage renal disease: Secondary | ICD-10-CM | POA: Diagnosis not present

## 2015-10-13 DIAGNOSIS — N186 End stage renal disease: Secondary | ICD-10-CM | POA: Diagnosis not present

## 2015-10-13 DIAGNOSIS — D509 Iron deficiency anemia, unspecified: Secondary | ICD-10-CM | POA: Diagnosis not present

## 2015-10-13 DIAGNOSIS — N2581 Secondary hyperparathyroidism of renal origin: Secondary | ICD-10-CM | POA: Diagnosis not present

## 2015-10-16 DIAGNOSIS — N2581 Secondary hyperparathyroidism of renal origin: Secondary | ICD-10-CM | POA: Diagnosis not present

## 2015-10-16 DIAGNOSIS — N186 End stage renal disease: Secondary | ICD-10-CM | POA: Diagnosis not present

## 2015-10-16 DIAGNOSIS — I120 Hypertensive chronic kidney disease with stage 5 chronic kidney disease or end stage renal disease: Secondary | ICD-10-CM | POA: Diagnosis not present

## 2015-10-16 DIAGNOSIS — Z992 Dependence on renal dialysis: Secondary | ICD-10-CM | POA: Diagnosis not present

## 2015-10-16 DIAGNOSIS — D509 Iron deficiency anemia, unspecified: Secondary | ICD-10-CM | POA: Diagnosis not present

## 2015-10-17 ENCOUNTER — Ambulatory Visit: Payer: Self-pay | Admitting: Pharmacist

## 2015-10-18 DIAGNOSIS — N2581 Secondary hyperparathyroidism of renal origin: Secondary | ICD-10-CM | POA: Diagnosis not present

## 2015-10-18 DIAGNOSIS — D509 Iron deficiency anemia, unspecified: Secondary | ICD-10-CM | POA: Diagnosis not present

## 2015-10-18 DIAGNOSIS — N186 End stage renal disease: Secondary | ICD-10-CM | POA: Diagnosis not present

## 2015-10-19 ENCOUNTER — Ambulatory Visit (INDEPENDENT_AMBULATORY_CARE_PROVIDER_SITE_OTHER): Payer: Medicare Other | Admitting: Pharmacist

## 2015-10-19 DIAGNOSIS — Z86718 Personal history of other venous thrombosis and embolism: Secondary | ICD-10-CM

## 2015-10-19 DIAGNOSIS — Z86711 Personal history of pulmonary embolism: Secondary | ICD-10-CM | POA: Diagnosis present

## 2015-10-19 DIAGNOSIS — Z7901 Long term (current) use of anticoagulants: Secondary | ICD-10-CM | POA: Diagnosis not present

## 2015-10-19 LAB — POCT INR: INR: 3

## 2015-10-19 NOTE — Patient Instructions (Signed)
Patient educated about medication as defined in this encounter and verbalized understanding by repeating back instructions provided.   

## 2015-10-19 NOTE — Progress Notes (Signed)
INTERNAL MEDICINE TEACHING ATTENDING ADDENDUM - Lalla Brothers M.D  Duration- indefinite, Indication- PE, INR- 3.0. Agree with pharmacy recommendations as outlined in their note.

## 2015-10-19 NOTE — Progress Notes (Addendum)
Anticoagulation Management Brian [name] is a 39 y.o. male who reports to the clinic for monitoring of warfarin treatment.    Indication: history of DVT and PE Duration: indefinite  Anticoagulation Clinic Visit History: Anticoagulation Episode Summary    Current INR goal 2.0-3.0  Next INR check 10/31/2015  INR from last check 3.0 (10/19/15)                  Indications  History of pulmonary embolus (PE) [Z86.711] Long term current use of anticoagulant [Z79.01] History of deep vein thrombosis [Z86.718]     ASSESSMENT Recent Results: Recent results are below, the most recent result is correlated with a dose of 62.5 mg per week: Lab Results  Component Value Date   INR 3.0 10/19/2015   INR 1.5 09/28/2015   INR 1.4 09/07/2015   INR today: Therapeutic, but jumped from 1.5 and patient tends to no-show so will slightly reduce dose  Anticoagulation Dosing: INR as of 10/19/2015 and Previous Dosing Information    INR Dt INR Goal Wkly Tot Sun Mon Tue Wed Thu Fri Sat     2.0-3.0 65 mg 10 mg 7.5 mg 10 mg 7.5 mg 10 mg 10 mg 10 mg    Previous description        Increase Warfarin to 1.5 tabs Mon and Wed, and 2 tabs all other days     Anticoagulation Dose Instructions as of 10/19/2015      Total Sun Mon Tue Wed Thu Fri Sat   New Dose 62.5 mg 10 mg 7.5 mg 10 mg 7.5 mg 10 mg 7.5 mg 10 mg     (5 mg x 2)  (5 mg x 1.5)  (5 mg x 2)  (5 mg x 1.5)  (5 mg x 2)  (5 mg x 1.5)  (5 mg x 2)                         Description        Increase Warfarin to 1.5 tabs Mon, Wed, Fri; 2 tabs all other days       PLAN Weekly dose was decreased by 4% to 62.5 mg per week  Patient Instructions  Patient educated about medication as defined in this encounter and verbalized understanding by repeating back instructions provided.   Follow-up Return in about 2 weeks (around 11/02/2015).  Kim,Jennifer J  30 minutes spent face-to-face with the patient during the encounter. 50% of time spent on  education. 50% of time was spent on assessment and plan.

## 2015-10-20 DIAGNOSIS — N186 End stage renal disease: Secondary | ICD-10-CM | POA: Diagnosis not present

## 2015-10-20 DIAGNOSIS — D509 Iron deficiency anemia, unspecified: Secondary | ICD-10-CM | POA: Diagnosis not present

## 2015-10-20 DIAGNOSIS — N2581 Secondary hyperparathyroidism of renal origin: Secondary | ICD-10-CM | POA: Diagnosis not present

## 2015-10-21 DIAGNOSIS — S99921A Unspecified injury of right foot, initial encounter: Secondary | ICD-10-CM | POA: Diagnosis not present

## 2015-10-21 DIAGNOSIS — S99911A Unspecified injury of right ankle, initial encounter: Secondary | ICD-10-CM | POA: Diagnosis not present

## 2015-10-23 DIAGNOSIS — N186 End stage renal disease: Secondary | ICD-10-CM | POA: Diagnosis not present

## 2015-10-23 DIAGNOSIS — N2581 Secondary hyperparathyroidism of renal origin: Secondary | ICD-10-CM | POA: Diagnosis not present

## 2015-10-23 DIAGNOSIS — D509 Iron deficiency anemia, unspecified: Secondary | ICD-10-CM | POA: Diagnosis not present

## 2015-10-25 DIAGNOSIS — N2581 Secondary hyperparathyroidism of renal origin: Secondary | ICD-10-CM | POA: Diagnosis not present

## 2015-10-25 DIAGNOSIS — D509 Iron deficiency anemia, unspecified: Secondary | ICD-10-CM | POA: Diagnosis not present

## 2015-10-25 DIAGNOSIS — N186 End stage renal disease: Secondary | ICD-10-CM | POA: Diagnosis not present

## 2015-10-25 DIAGNOSIS — I8291 Chronic embolism and thrombosis of unspecified vein: Secondary | ICD-10-CM | POA: Diagnosis not present

## 2015-10-26 ENCOUNTER — Encounter: Payer: Self-pay | Admitting: Pharmacist

## 2015-10-26 NOTE — Progress Notes (Signed)
Patient ID: Brian [name], male   DOB: Oct 12, 1976, 39 y.o.   MRN: [id number]  INR result faxed from [place]. INR result = 1.76. Tried calling patient to discuss, unable to reach. Patient has an upcoming appointment on 10/31/15, can address at that time.

## 2015-10-27 DIAGNOSIS — N2581 Secondary hyperparathyroidism of renal origin: Secondary | ICD-10-CM | POA: Diagnosis not present

## 2015-10-27 DIAGNOSIS — D509 Iron deficiency anemia, unspecified: Secondary | ICD-10-CM | POA: Diagnosis not present

## 2015-10-27 DIAGNOSIS — N186 End stage renal disease: Secondary | ICD-10-CM | POA: Diagnosis not present

## 2015-10-30 DIAGNOSIS — D509 Iron deficiency anemia, unspecified: Secondary | ICD-10-CM | POA: Diagnosis not present

## 2015-10-30 DIAGNOSIS — N186 End stage renal disease: Secondary | ICD-10-CM | POA: Diagnosis not present

## 2015-10-30 DIAGNOSIS — N2581 Secondary hyperparathyroidism of renal origin: Secondary | ICD-10-CM | POA: Diagnosis not present

## 2015-10-31 ENCOUNTER — Ambulatory Visit: Payer: Self-pay | Admitting: Pharmacist

## 2015-10-31 ENCOUNTER — Other Ambulatory Visit: Payer: Self-pay | Admitting: Internal Medicine

## 2015-10-31 DIAGNOSIS — F17200 Nicotine dependence, unspecified, uncomplicated: Secondary | ICD-10-CM

## 2015-11-01 DIAGNOSIS — N186 End stage renal disease: Secondary | ICD-10-CM | POA: Diagnosis not present

## 2015-11-01 DIAGNOSIS — N2581 Secondary hyperparathyroidism of renal origin: Secondary | ICD-10-CM | POA: Diagnosis not present

## 2015-11-01 DIAGNOSIS — D509 Iron deficiency anemia, unspecified: Secondary | ICD-10-CM | POA: Diagnosis not present

## 2015-11-03 DIAGNOSIS — N186 End stage renal disease: Secondary | ICD-10-CM | POA: Diagnosis not present

## 2015-11-03 DIAGNOSIS — N2581 Secondary hyperparathyroidism of renal origin: Secondary | ICD-10-CM | POA: Diagnosis not present

## 2015-11-03 DIAGNOSIS — D509 Iron deficiency anemia, unspecified: Secondary | ICD-10-CM | POA: Diagnosis not present

## 2015-11-06 DIAGNOSIS — N186 End stage renal disease: Secondary | ICD-10-CM | POA: Diagnosis not present

## 2015-11-06 DIAGNOSIS — N2581 Secondary hyperparathyroidism of renal origin: Secondary | ICD-10-CM | POA: Diagnosis not present

## 2015-11-06 DIAGNOSIS — D509 Iron deficiency anemia, unspecified: Secondary | ICD-10-CM | POA: Diagnosis not present

## 2015-11-08 DIAGNOSIS — N2581 Secondary hyperparathyroidism of renal origin: Secondary | ICD-10-CM | POA: Diagnosis not present

## 2015-11-08 DIAGNOSIS — N186 End stage renal disease: Secondary | ICD-10-CM | POA: Diagnosis not present

## 2015-11-08 DIAGNOSIS — D509 Iron deficiency anemia, unspecified: Secondary | ICD-10-CM | POA: Diagnosis not present

## 2015-11-11 DIAGNOSIS — D509 Iron deficiency anemia, unspecified: Secondary | ICD-10-CM | POA: Diagnosis not present

## 2015-11-11 DIAGNOSIS — N2581 Secondary hyperparathyroidism of renal origin: Secondary | ICD-10-CM | POA: Diagnosis not present

## 2015-11-11 DIAGNOSIS — N186 End stage renal disease: Secondary | ICD-10-CM | POA: Diagnosis not present

## 2015-11-13 DIAGNOSIS — N2581 Secondary hyperparathyroidism of renal origin: Secondary | ICD-10-CM | POA: Diagnosis not present

## 2015-11-13 DIAGNOSIS — D509 Iron deficiency anemia, unspecified: Secondary | ICD-10-CM | POA: Diagnosis not present

## 2015-11-13 DIAGNOSIS — N186 End stage renal disease: Secondary | ICD-10-CM | POA: Diagnosis not present

## 2015-11-15 DIAGNOSIS — I120 Hypertensive chronic kidney disease with stage 5 chronic kidney disease or end stage renal disease: Secondary | ICD-10-CM | POA: Diagnosis not present

## 2015-11-15 DIAGNOSIS — D509 Iron deficiency anemia, unspecified: Secondary | ICD-10-CM | POA: Diagnosis not present

## 2015-11-15 DIAGNOSIS — N2581 Secondary hyperparathyroidism of renal origin: Secondary | ICD-10-CM | POA: Diagnosis not present

## 2015-11-15 DIAGNOSIS — N186 End stage renal disease: Secondary | ICD-10-CM | POA: Diagnosis not present

## 2015-11-15 DIAGNOSIS — Z992 Dependence on renal dialysis: Secondary | ICD-10-CM | POA: Diagnosis not present

## 2015-11-16 ENCOUNTER — Other Ambulatory Visit: Payer: Self-pay | Admitting: Internal Medicine

## 2015-11-16 DIAGNOSIS — B20 Human immunodeficiency virus [HIV] disease: Secondary | ICD-10-CM

## 2015-11-16 MED ORDER — EFAVIRENZ 600 MG PO TABS: ORAL_TABLET | ORAL | Status: DC

## 2015-11-17 DIAGNOSIS — N186 End stage renal disease: Secondary | ICD-10-CM | POA: Diagnosis not present

## 2015-11-17 DIAGNOSIS — D509 Iron deficiency anemia, unspecified: Secondary | ICD-10-CM | POA: Diagnosis not present

## 2015-11-17 DIAGNOSIS — N2581 Secondary hyperparathyroidism of renal origin: Secondary | ICD-10-CM | POA: Diagnosis not present

## 2015-11-17 DIAGNOSIS — D631 Anemia in chronic kidney disease: Secondary | ICD-10-CM | POA: Diagnosis not present

## 2015-11-20 DIAGNOSIS — N186 End stage renal disease: Secondary | ICD-10-CM | POA: Diagnosis not present

## 2015-11-20 DIAGNOSIS — N2581 Secondary hyperparathyroidism of renal origin: Secondary | ICD-10-CM | POA: Diagnosis not present

## 2015-11-20 DIAGNOSIS — D509 Iron deficiency anemia, unspecified: Secondary | ICD-10-CM | POA: Diagnosis not present

## 2015-11-20 DIAGNOSIS — D631 Anemia in chronic kidney disease: Secondary | ICD-10-CM | POA: Diagnosis not present

## 2015-11-22 DIAGNOSIS — D509 Iron deficiency anemia, unspecified: Secondary | ICD-10-CM | POA: Diagnosis not present

## 2015-11-22 DIAGNOSIS — N186 End stage renal disease: Secondary | ICD-10-CM | POA: Diagnosis not present

## 2015-11-22 DIAGNOSIS — N2581 Secondary hyperparathyroidism of renal origin: Secondary | ICD-10-CM | POA: Diagnosis not present

## 2015-11-22 DIAGNOSIS — D631 Anemia in chronic kidney disease: Secondary | ICD-10-CM | POA: Diagnosis not present

## 2015-11-24 DIAGNOSIS — N186 End stage renal disease: Secondary | ICD-10-CM | POA: Diagnosis not present

## 2015-11-24 DIAGNOSIS — D509 Iron deficiency anemia, unspecified: Secondary | ICD-10-CM | POA: Diagnosis not present

## 2015-11-24 DIAGNOSIS — D631 Anemia in chronic kidney disease: Secondary | ICD-10-CM | POA: Diagnosis not present

## 2015-11-24 DIAGNOSIS — N2581 Secondary hyperparathyroidism of renal origin: Secondary | ICD-10-CM | POA: Diagnosis not present

## 2015-11-27 DIAGNOSIS — D631 Anemia in chronic kidney disease: Secondary | ICD-10-CM | POA: Diagnosis not present

## 2015-11-27 DIAGNOSIS — D509 Iron deficiency anemia, unspecified: Secondary | ICD-10-CM | POA: Diagnosis not present

## 2015-11-27 DIAGNOSIS — N186 End stage renal disease: Secondary | ICD-10-CM | POA: Diagnosis not present

## 2015-11-27 DIAGNOSIS — N2581 Secondary hyperparathyroidism of renal origin: Secondary | ICD-10-CM | POA: Diagnosis not present

## 2015-11-29 ENCOUNTER — Other Ambulatory Visit: Payer: Self-pay | Admitting: Internal Medicine

## 2015-11-29 DIAGNOSIS — Z9109 Other allergy status, other than to drugs and biological substances: Secondary | ICD-10-CM | POA: Insufficient documentation

## 2015-11-29 DIAGNOSIS — N2581 Secondary hyperparathyroidism of renal origin: Secondary | ICD-10-CM | POA: Diagnosis not present

## 2015-11-29 DIAGNOSIS — N186 End stage renal disease: Secondary | ICD-10-CM | POA: Diagnosis not present

## 2015-11-29 DIAGNOSIS — I8291 Chronic embolism and thrombosis of unspecified vein: Secondary | ICD-10-CM | POA: Diagnosis not present

## 2015-11-29 DIAGNOSIS — D631 Anemia in chronic kidney disease: Secondary | ICD-10-CM | POA: Diagnosis not present

## 2015-11-29 DIAGNOSIS — D509 Iron deficiency anemia, unspecified: Secondary | ICD-10-CM | POA: Diagnosis not present

## 2015-11-30 ENCOUNTER — Telehealth: Payer: Self-pay

## 2015-11-30 ENCOUNTER — Encounter: Payer: Self-pay | Admitting: Internal Medicine

## 2015-11-30 ENCOUNTER — Ambulatory Visit (INDEPENDENT_AMBULATORY_CARE_PROVIDER_SITE_OTHER): Payer: Medicare Other | Admitting: Internal Medicine

## 2015-11-30 VITALS — BP 128/82 | HR 106 | Temp 98.1°F | Ht 74.0 in | Wt 181.0 lb

## 2015-11-30 DIAGNOSIS — F17210 Nicotine dependence, cigarettes, uncomplicated: Secondary | ICD-10-CM | POA: Diagnosis not present

## 2015-11-30 DIAGNOSIS — G4700 Insomnia, unspecified: Secondary | ICD-10-CM | POA: Diagnosis not present

## 2015-11-30 DIAGNOSIS — N186 End stage renal disease: Secondary | ICD-10-CM

## 2015-11-30 DIAGNOSIS — Q613 Polycystic kidney, unspecified: Secondary | ICD-10-CM | POA: Diagnosis not present

## 2015-11-30 DIAGNOSIS — Z86711 Personal history of pulmonary embolism: Secondary | ICD-10-CM

## 2015-11-30 DIAGNOSIS — I953 Hypotension of hemodialysis: Secondary | ICD-10-CM

## 2015-11-30 DIAGNOSIS — F17200 Nicotine dependence, unspecified, uncomplicated: Secondary | ICD-10-CM

## 2015-11-30 DIAGNOSIS — Z7901 Long term (current) use of anticoagulants: Secondary | ICD-10-CM

## 2015-11-30 DIAGNOSIS — Z992 Dependence on renal dialysis: Secondary | ICD-10-CM | POA: Diagnosis not present

## 2015-11-30 DIAGNOSIS — G2581 Restless legs syndrome: Secondary | ICD-10-CM | POA: Diagnosis not present

## 2015-11-30 LAB — PROTIME-INR: INR: 1.8 — AB (ref 0.9–1.1)

## 2015-11-30 LAB — POCT INR: INR: 1.5

## 2015-11-30 MED ORDER — OXYCODONE-ACETAMINOPHEN 10-325 MG PO TABS: 1.0000 | ORAL_TABLET | ORAL | Status: DC | PRN

## 2015-11-30 MED ORDER — PRAMIPEXOLE DIHYDROCHLORIDE 0.5 MG PO TABS: 0.5000 mg | ORAL_TABLET | Freq: Every day | ORAL | Status: DC

## 2015-11-30 NOTE — Patient Instructions (Signed)
Brian [name], it was great to see you today.  We can start to wean the klonopin, but do not stop taking entirely, as this can be dangerous. If you're ready, you can take 1/2 tablet (0.25 mg) before bed, and we can see how you're doing.  Keep taking the pramipexole. This appears to be working.  Please go see [facility] to reassess your graft. We will try to make an appointment.

## 2015-11-30 NOTE — Progress Notes (Signed)
Anticoagulation Management Brian [name] is a 39 y.o. male who is being monitored via team care co-visit for warfarin management.  Indication: PEhistory Duration: indefinite  Anticoagulation Clinic Visit History: Patient does not report signs/symptoms of bleeding or thromboembolism, patient states he did miss 1 dose of warfarin over the past week. Other recent changes: none reported  Anticoagulation Episode Summary    Current INR goal 2.0-3.0  Next INR check 12/28/2015  INR from last check 1.5! (11/30/2015)  Weekly max dose   Target end date   INR check location   Preferred lab   Send INR reminders to    Indications  History of pulmonary embolus (PE) [Z86.711] Long term current use of anticoagulant [Z79.01] History of deep vein thrombosis (Resolved) [Z86.718]        Comments        ASSESSMENT Recent Results: Recent results are below, the most recent result is correlated with a dose of 62.5 mg per week: Lab Results  Component Value Date   INR 1.5 11/30/2015   INR 3.0 10/19/2015   INR 1.5 09/28/2015   INR today: Subtherapeutic  Anticoagulation Dosing: INR as of 11/30/2015 and Previous Dosing Information    INR Dt INR Goal [employer] Sun Mon Tue Wed Thu Fri Sat   11/30/2015 1.5 2.0-3.0 62.5 mg 10 mg 7.5 mg 10 mg 7.5 mg 10 mg 7.5 mg 10 mg    Previous description        Increase Warfarin to 1.5 tabs Mon, Wed, Fri; 2 tabs all other days     Anticoagulation Dose Instructions as of 11/30/2015      Total Sun Mon Tue Wed Thu Fri Sat   New Dose 65 mg 10 mg 7.5 mg 10 mg 7.5 mg 10 mg 10 mg 10 mg     (5 mg x 2)  (5 mg x 1.5)  (5 mg x 2)  (5 mg x 1.5)  (5 mg x 2)  (5 mg x 2)  (5 mg x 2)                           PLAN Weekly dose was only increased by 4% to 65 mg per week due to history of labile INRs. Patient was educated on the importance of medication adherence and verbalized understanding. Patient education was provided with supervision by Brian [name], PharmD  Candidate.  Follow-up 2-4 weeks  Brian [name]   15 minutes spent face-to-face with the patient during the encounter. 75% of time spent on education. 25% of time was spent on assessment and plan.

## 2015-12-01 DIAGNOSIS — D509 Iron deficiency anemia, unspecified: Secondary | ICD-10-CM | POA: Diagnosis not present

## 2015-12-01 DIAGNOSIS — N186 End stage renal disease: Secondary | ICD-10-CM | POA: Diagnosis not present

## 2015-12-01 DIAGNOSIS — D631 Anemia in chronic kidney disease: Secondary | ICD-10-CM | POA: Diagnosis not present

## 2015-12-01 DIAGNOSIS — N2581 Secondary hyperparathyroidism of renal origin: Secondary | ICD-10-CM | POA: Diagnosis not present

## 2015-12-01 NOTE — Assessment & Plan Note (Signed)
Patient is having some success cutting back on tobacco abuse with patches and gum. He was previously smoking 1/2 ppd, and now only smokes 4-5 cigarettes a day. A barrier to quitting has been his girlfriend, who continues to smoke 2 ppd. I advised them to quit together. We spent 5 minutes discussing smoking cessation strategies.  Plan: - Continue 14 mg nicotine patch daily with gum for breakthrough cravings.

## 2015-12-01 NOTE — Assessment & Plan Note (Signed)
Normotensive today, improved from 90/48 during last visit. Previous episodes likely related to incorrect dry weight. - Continue tapering clonazepam

## 2015-12-01 NOTE — Assessment & Plan Note (Signed)
Patient has begun to implement behavioral changes. He has not had any rebound insomnia even after discontinuing zolpidem. He is interested in weaning down klonopin. - Advised to reduce klonopin to 0.25 mg nightly whenever he is ready by taking only half a tablet - Continue behavioral interventions

## 2015-12-01 NOTE — Assessment & Plan Note (Addendum)
Patient has had no episodes of hypotension on current opioid regimen after resolving his dialysis issue. These medications make him more functional and do not induce somnolence, which is confirmed by his girlfriend. His AVF has been undergoing gradual changes as well, and likely needs re-evaluation - Refill 2 months of percocet 10-325 q4h prn pain. - Referral to [facility]

## 2015-12-01 NOTE — Assessment & Plan Note (Signed)
He has successfully uptitrated his pramipexole to good effect. He is not able to sleep through the night. - Continue pramipexole 0.5 mg nightly

## 2015-12-01 NOTE — Progress Notes (Signed)
   Subjective:    Patient ID: Brian [name], male    DOB: 22-Jul-1976, 39 y.o.   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 He is joined by his significant other, Brian [name], today.  Brian [name] has successfully titrated his pramipexole to 0.50 mg qhs 2 hours before bed. He says the medication has "worked like a dream" for his insomnia and RLS, even after we discontinued the zolpidem. He and his girlfriend report that he has been less irritable as a result, which has been great for their relationship. He would strongly like to continue the medication and is interested in weaning off the klonopin.  He reports that his graft has enlarged over the years since it was developed by [facility]. The superior end of the graft now extends to his neck. He says it continues to be useful for dialysis. He thanked me for calling his dialysis center and making them change his dry weight. Since then, he no longer has episodes of hypotension and can successfully complete his sessions.  He says his pain has been well controlled on his current opioid regimen. He reports it makes him more functional. His girlfriend confirms he has not been drowsy on the percocet 10-325.  Review of Systems  Constitutional: Negative for chills, fatigue and unexpected weight change.  HENT: Negative for congestion.   Eyes: Negative for visual disturbance.  Respiratory: Negative for cough and shortness of breath.   Cardiovascular: Negative for chest pain and leg swelling.  Gastrointestinal: Negative for nausea, diarrhea and constipation.       Pain on his right abdomen and flank.  Genitourinary:       Urinates "1 cup" a day  Skin:       Graft starting to extend up neck.  Neurological: Negative for dizziness and syncope.  Psychiatric/Behavioral:  Negative for sleep disturbance, dysphoric mood and decreased concentration. The patient is not nervous/anxious.        Objective:   Physical Exam  Constitutional: No distress.  HENT:  Mouth/Throat: No oropharyngeal exudate.  Edentulous. Moist mucous membranes.  Eyes: EOM are normal. Pupils are equal, round, and reactive to light. No scleral icterus.  Neck: No thyromegaly present.  Cardiovascular: Intact distal pulses.  Continuous murmur. Palpable thrill at right UE AVF. AVF extending to clavicular region Pulmonary/Chest: Effort normal and breath sounds normal. No respiratory distress.  Abdominal: Soft. Bowel sounds are normal.  Two slightly tender periumbilical hernias.  Musculoskeletal: He exhibits no edema.  No atrophy. Pain on palpation of right flank.  Lymphadenopathy:   He has no cervical adenopathy.  Neurological: He is alert. He has normal reflexes.  Skin: Skin is warm and dry.  Psychiatric: He has a normal mood and affect.       Assessment & Plan:  See problem-based assessment and plan for details.

## 2015-12-04 DIAGNOSIS — N2581 Secondary hyperparathyroidism of renal origin: Secondary | ICD-10-CM | POA: Diagnosis not present

## 2015-12-04 DIAGNOSIS — D509 Iron deficiency anemia, unspecified: Secondary | ICD-10-CM | POA: Diagnosis not present

## 2015-12-04 DIAGNOSIS — D631 Anemia in chronic kidney disease: Secondary | ICD-10-CM | POA: Diagnosis not present

## 2015-12-04 DIAGNOSIS — N186 End stage renal disease: Secondary | ICD-10-CM | POA: Diagnosis not present

## 2015-12-04 LAB — PROTIME-INR: INR: 1.5 — AB (ref 0.9–1.1)

## 2015-12-04 NOTE — Telephone Encounter (Signed)
Encounter opened in error

## 2015-12-05 NOTE — Progress Notes (Signed)
Internal Medicine Clinic Attending  I saw and evaluated the patient.  I personally confirmed the key portions of the history and exam documented by Dr. Ford and I reviewed pertinent patient test results.  The assessment, diagnosis, and plan were formulated together and I agree with the documentation in the resident's note. 

## 2015-12-06 DIAGNOSIS — D631 Anemia in chronic kidney disease: Secondary | ICD-10-CM | POA: Diagnosis not present

## 2015-12-06 DIAGNOSIS — N186 End stage renal disease: Secondary | ICD-10-CM | POA: Diagnosis not present

## 2015-12-06 DIAGNOSIS — N2581 Secondary hyperparathyroidism of renal origin: Secondary | ICD-10-CM | POA: Diagnosis not present

## 2015-12-06 DIAGNOSIS — D509 Iron deficiency anemia, unspecified: Secondary | ICD-10-CM | POA: Diagnosis not present

## 2015-12-08 DIAGNOSIS — D631 Anemia in chronic kidney disease: Secondary | ICD-10-CM | POA: Diagnosis not present

## 2015-12-08 DIAGNOSIS — N2581 Secondary hyperparathyroidism of renal origin: Secondary | ICD-10-CM | POA: Diagnosis not present

## 2015-12-08 DIAGNOSIS — D509 Iron deficiency anemia, unspecified: Secondary | ICD-10-CM | POA: Diagnosis not present

## 2015-12-08 DIAGNOSIS — N186 End stage renal disease: Secondary | ICD-10-CM | POA: Diagnosis not present

## 2015-12-11 DIAGNOSIS — D631 Anemia in chronic kidney disease: Secondary | ICD-10-CM | POA: Diagnosis not present

## 2015-12-11 DIAGNOSIS — N186 End stage renal disease: Secondary | ICD-10-CM | POA: Diagnosis not present

## 2015-12-11 DIAGNOSIS — N2581 Secondary hyperparathyroidism of renal origin: Secondary | ICD-10-CM | POA: Diagnosis not present

## 2015-12-11 DIAGNOSIS — D509 Iron deficiency anemia, unspecified: Secondary | ICD-10-CM | POA: Diagnosis not present

## 2015-12-13 DIAGNOSIS — N2581 Secondary hyperparathyroidism of renal origin: Secondary | ICD-10-CM | POA: Diagnosis not present

## 2015-12-13 DIAGNOSIS — N186 End stage renal disease: Secondary | ICD-10-CM | POA: Diagnosis not present

## 2015-12-13 DIAGNOSIS — D509 Iron deficiency anemia, unspecified: Secondary | ICD-10-CM | POA: Diagnosis not present

## 2015-12-13 DIAGNOSIS — D631 Anemia in chronic kidney disease: Secondary | ICD-10-CM | POA: Diagnosis not present

## 2015-12-15 ENCOUNTER — Other Ambulatory Visit: Payer: Self-pay | Admitting: Cardiology

## 2015-12-15 DIAGNOSIS — D631 Anemia in chronic kidney disease: Secondary | ICD-10-CM | POA: Diagnosis not present

## 2015-12-15 DIAGNOSIS — N2581 Secondary hyperparathyroidism of renal origin: Secondary | ICD-10-CM | POA: Diagnosis not present

## 2015-12-15 DIAGNOSIS — N186 End stage renal disease: Secondary | ICD-10-CM | POA: Diagnosis not present

## 2015-12-15 DIAGNOSIS — D509 Iron deficiency anemia, unspecified: Secondary | ICD-10-CM | POA: Diagnosis not present

## 2015-12-16 DIAGNOSIS — N186 End stage renal disease: Secondary | ICD-10-CM | POA: Diagnosis not present

## 2015-12-16 DIAGNOSIS — I120 Hypertensive chronic kidney disease with stage 5 chronic kidney disease or end stage renal disease: Secondary | ICD-10-CM | POA: Diagnosis not present

## 2015-12-16 DIAGNOSIS — Z992 Dependence on renal dialysis: Secondary | ICD-10-CM | POA: Diagnosis not present

## 2015-12-18 DIAGNOSIS — N2581 Secondary hyperparathyroidism of renal origin: Secondary | ICD-10-CM | POA: Diagnosis not present

## 2015-12-18 DIAGNOSIS — D509 Iron deficiency anemia, unspecified: Secondary | ICD-10-CM | POA: Diagnosis not present

## 2015-12-18 DIAGNOSIS — D631 Anemia in chronic kidney disease: Secondary | ICD-10-CM | POA: Diagnosis not present

## 2015-12-18 DIAGNOSIS — N186 End stage renal disease: Secondary | ICD-10-CM | POA: Diagnosis not present

## 2015-12-20 DIAGNOSIS — N2581 Secondary hyperparathyroidism of renal origin: Secondary | ICD-10-CM | POA: Diagnosis not present

## 2015-12-20 DIAGNOSIS — N186 End stage renal disease: Secondary | ICD-10-CM | POA: Diagnosis not present

## 2015-12-20 DIAGNOSIS — D631 Anemia in chronic kidney disease: Secondary | ICD-10-CM | POA: Diagnosis not present

## 2015-12-20 DIAGNOSIS — D509 Iron deficiency anemia, unspecified: Secondary | ICD-10-CM | POA: Diagnosis not present

## 2015-12-22 ENCOUNTER — Encounter: Payer: Self-pay | Admitting: Vascular Surgery

## 2015-12-22 DIAGNOSIS — N186 End stage renal disease: Secondary | ICD-10-CM | POA: Diagnosis not present

## 2015-12-22 DIAGNOSIS — N2581 Secondary hyperparathyroidism of renal origin: Secondary | ICD-10-CM | POA: Diagnosis not present

## 2015-12-22 DIAGNOSIS — D509 Iron deficiency anemia, unspecified: Secondary | ICD-10-CM | POA: Diagnosis not present

## 2015-12-22 DIAGNOSIS — D631 Anemia in chronic kidney disease: Secondary | ICD-10-CM | POA: Diagnosis not present

## 2015-12-26 ENCOUNTER — Other Ambulatory Visit: Payer: Self-pay | Admitting: *Deleted

## 2015-12-26 DIAGNOSIS — N186 End stage renal disease: Secondary | ICD-10-CM

## 2015-12-26 DIAGNOSIS — N2581 Secondary hyperparathyroidism of renal origin: Secondary | ICD-10-CM | POA: Diagnosis not present

## 2015-12-26 DIAGNOSIS — Z4931 Encounter for adequacy testing for hemodialysis: Secondary | ICD-10-CM

## 2015-12-26 DIAGNOSIS — D631 Anemia in chronic kidney disease: Secondary | ICD-10-CM | POA: Diagnosis not present

## 2015-12-26 DIAGNOSIS — D509 Iron deficiency anemia, unspecified: Secondary | ICD-10-CM | POA: Diagnosis not present

## 2015-12-27 DIAGNOSIS — D509 Iron deficiency anemia, unspecified: Secondary | ICD-10-CM | POA: Diagnosis not present

## 2015-12-27 DIAGNOSIS — N2581 Secondary hyperparathyroidism of renal origin: Secondary | ICD-10-CM | POA: Diagnosis not present

## 2015-12-27 DIAGNOSIS — I8291 Chronic embolism and thrombosis of unspecified vein: Secondary | ICD-10-CM | POA: Diagnosis not present

## 2015-12-27 DIAGNOSIS — N186 End stage renal disease: Secondary | ICD-10-CM | POA: Diagnosis not present

## 2015-12-27 DIAGNOSIS — D631 Anemia in chronic kidney disease: Secondary | ICD-10-CM | POA: Diagnosis not present

## 2015-12-28 ENCOUNTER — Encounter: Payer: Self-pay | Admitting: Vascular Surgery

## 2015-12-28 ENCOUNTER — Ambulatory Visit (INDEPENDENT_AMBULATORY_CARE_PROVIDER_SITE_OTHER): Payer: Medicare Other | Admitting: Vascular Surgery

## 2015-12-28 ENCOUNTER — Ambulatory Visit (HOSPITAL_COMMUNITY)
Admission: RE | Admit: 2015-12-28 | Discharge: 2015-12-28 | Disposition: A | Discharge status: Home / Self Care | Payer: Medicare Other | Source: Ambulatory Visit | Attending: Vascular Surgery | Admitting: Vascular Surgery

## 2015-12-28 ENCOUNTER — Ambulatory Visit (INDEPENDENT_AMBULATORY_CARE_PROVIDER_SITE_OTHER): Payer: Medicare Other | Admitting: Pharmacist

## 2015-12-28 VITALS — BP 124/81 | HR 104 | Ht 74.0 in | Wt 180.7 lb

## 2015-12-28 DIAGNOSIS — Z86718 Personal history of other venous thrombosis and embolism: Secondary | ICD-10-CM

## 2015-12-28 DIAGNOSIS — Z7901 Long term (current) use of anticoagulants: Secondary | ICD-10-CM | POA: Diagnosis not present

## 2015-12-28 DIAGNOSIS — Z4931 Encounter for adequacy testing for hemodialysis: Secondary | ICD-10-CM | POA: Insufficient documentation

## 2015-12-28 DIAGNOSIS — Z86711 Personal history of pulmonary embolism: Secondary | ICD-10-CM

## 2015-12-28 DIAGNOSIS — N186 End stage renal disease: Secondary | ICD-10-CM

## 2015-12-28 DIAGNOSIS — Z992 Dependence on renal dialysis: Secondary | ICD-10-CM | POA: Diagnosis not present

## 2015-12-28 LAB — POCT INR: INR: 2.7

## 2015-12-28 NOTE — Progress Notes (Signed)
Results faxed to [place] from [facility] [phone]  Anticoagulation Management Brian [name] is a 40 y.o. male contacted for monitoring of warfarin treatment.    Indication: DVT and PEhistory Duration: indefinite  Anticoagulation Clinic Visit History: Patient does not report signs/symptoms of bleeding or thromboembolism   Anticoagulation Episode Summary    Current INR goal 2.0-3.0  Next INR check 01/04/2016  INR from last check 2.7 (12/28/2015)  Weekly max dose   Target end date Indefinite  INR check location   Preferred lab   Send INR reminders to    Indications  Deep vein thrombosis (HCC) (Resolved) [I82.409] History of deep vein thrombosis (Resolved) [Z86.718] PULMONARY EMBOLISM (Resolved) [I26.99] PULMONARY EMBOLISM (Resolved) [I26.99]        Comments        ASSESSMENT Recent Results: Recent results are below, the most recent result is correlated with a dose of 62.5 mg per week: Lab Results  Component Value Date   INR 2.7 12/28/2015   INR 1.5 11/30/2015   INR 1.5* 11/29/2015   INR today: Therapeutic  Anticoagulation Dosing: INR as of 12/28/2015 and Previous Dosing Information    INR Dt INR Goal [employer] Sun Mon Tue Wed Thu Fri Sat   12/28/2015 2.7 -            Anticoagulation Dose Instructions as of 12/28/2015      Total Sun Mon Tue Wed Thu Fri Sat   New Dose 62.5 mg 10 mg 10 mg 7.5 mg 10 mg 7.5 mg 10 mg 7.5 mg     (5 mg x 2)  (5 mg x 2)  (5 mg x 1.5)  (5 mg x 2)  (5 mg x 1.5)  (5 mg x 2)  (5 mg x 1.5)                           PLAN Weekly dose was unchanged  Patient Instructions  Patient educated about medication as defined in this encounter and verbalized understanding by repeating back instructions provided.   Follow-up Return in about 1 week (around 01/04/2016) for Follow up INR 01/04/16 at [place]
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
## 2015-12-29 DIAGNOSIS — N2581 Secondary hyperparathyroidism of renal origin: Secondary | ICD-10-CM | POA: Diagnosis not present

## 2015-12-29 DIAGNOSIS — D509 Iron deficiency anemia, unspecified: Secondary | ICD-10-CM | POA: Diagnosis not present

## 2015-12-29 DIAGNOSIS — N186 End stage renal disease: Secondary | ICD-10-CM | POA: Diagnosis not present

## 2015-12-29 DIAGNOSIS — D631 Anemia in chronic kidney disease: Secondary | ICD-10-CM | POA: Diagnosis not present

## 2015-12-29 NOTE — Patient Instructions (Signed)
Patient educated about medication as defined in this encounter and verbalized understanding by repeating back instructions provided.   

## 2015-12-31 NOTE — Progress Notes (Signed)
I have reviewed Dr. Julianne Rice note.

## 2016-01-01 DIAGNOSIS — D631 Anemia in chronic kidney disease: Secondary | ICD-10-CM | POA: Diagnosis not present

## 2016-01-01 DIAGNOSIS — N186 End stage renal disease: Secondary | ICD-10-CM | POA: Diagnosis not present

## 2016-01-01 DIAGNOSIS — N2581 Secondary hyperparathyroidism of renal origin: Secondary | ICD-10-CM | POA: Diagnosis not present

## 2016-01-01 DIAGNOSIS — D509 Iron deficiency anemia, unspecified: Secondary | ICD-10-CM | POA: Diagnosis not present

## 2016-01-03 DIAGNOSIS — D631 Anemia in chronic kidney disease: Secondary | ICD-10-CM | POA: Diagnosis not present

## 2016-01-03 DIAGNOSIS — D509 Iron deficiency anemia, unspecified: Secondary | ICD-10-CM | POA: Diagnosis not present

## 2016-01-03 DIAGNOSIS — N186 End stage renal disease: Secondary | ICD-10-CM | POA: Diagnosis not present

## 2016-01-03 DIAGNOSIS — N2581 Secondary hyperparathyroidism of renal origin: Secondary | ICD-10-CM | POA: Diagnosis not present

## 2016-01-04 ENCOUNTER — Ambulatory Visit: Payer: Self-pay | Admitting: Pharmacist

## 2016-01-05 DIAGNOSIS — N2581 Secondary hyperparathyroidism of renal origin: Secondary | ICD-10-CM | POA: Diagnosis not present

## 2016-01-05 DIAGNOSIS — D509 Iron deficiency anemia, unspecified: Secondary | ICD-10-CM | POA: Diagnosis not present

## 2016-01-05 DIAGNOSIS — N186 End stage renal disease: Secondary | ICD-10-CM | POA: Diagnosis not present

## 2016-01-05 DIAGNOSIS — D631 Anemia in chronic kidney disease: Secondary | ICD-10-CM | POA: Diagnosis not present

## 2016-01-08 DIAGNOSIS — N2581 Secondary hyperparathyroidism of renal origin: Secondary | ICD-10-CM | POA: Diagnosis not present

## 2016-01-08 DIAGNOSIS — D631 Anemia in chronic kidney disease: Secondary | ICD-10-CM | POA: Diagnosis not present

## 2016-01-08 DIAGNOSIS — N186 End stage renal disease: Secondary | ICD-10-CM | POA: Diagnosis not present

## 2016-01-08 DIAGNOSIS — D509 Iron deficiency anemia, unspecified: Secondary | ICD-10-CM | POA: Diagnosis not present

## 2016-01-10 DIAGNOSIS — N2581 Secondary hyperparathyroidism of renal origin: Secondary | ICD-10-CM | POA: Diagnosis not present

## 2016-01-10 DIAGNOSIS — N186 End stage renal disease: Secondary | ICD-10-CM | POA: Diagnosis not present

## 2016-01-10 DIAGNOSIS — D631 Anemia in chronic kidney disease: Secondary | ICD-10-CM | POA: Diagnosis not present

## 2016-01-10 DIAGNOSIS — D509 Iron deficiency anemia, unspecified: Secondary | ICD-10-CM | POA: Diagnosis not present

## 2016-01-11 ENCOUNTER — Ambulatory Visit (INDEPENDENT_AMBULATORY_CARE_PROVIDER_SITE_OTHER): Payer: Medicare Other | Admitting: Pharmacist

## 2016-01-11 ENCOUNTER — Other Ambulatory Visit: Payer: Self-pay | Admitting: Internal Medicine

## 2016-01-11 DIAGNOSIS — M62838 Other muscle spasm: Secondary | ICD-10-CM

## 2016-01-11 DIAGNOSIS — G2581 Restless legs syndrome: Secondary | ICD-10-CM

## 2016-01-11 DIAGNOSIS — Z7901 Long term (current) use of anticoagulants: Secondary | ICD-10-CM

## 2016-01-11 DIAGNOSIS — Z86711 Personal history of pulmonary embolism: Secondary | ICD-10-CM | POA: Diagnosis not present

## 2016-01-11 DIAGNOSIS — Z86718 Personal history of other venous thrombosis and embolism: Secondary | ICD-10-CM | POA: Diagnosis not present

## 2016-01-11 LAB — POCT INR: INR: 2.3

## 2016-01-11 MED ORDER — PRAMIPEXOLE DIHYDROCHLORIDE 0.5 MG PO TABS: 0.5000 mg | ORAL_TABLET | Freq: Every day | ORAL | Status: DC

## 2016-01-11 NOTE — Telephone Encounter (Signed)
I had previously prescribed the 0.5 mg tablet of pramipexole to help with pill burden. I have refilled this. If the patient specifically need the 0.125 mg tablet, let me know. Thank you.

## 2016-01-11 NOTE — Patient Instructions (Signed)
Patient educated about medication as defined in this encounter and verbalized understanding by repeating back instructions provided.   

## 2016-01-11 NOTE — Progress Notes (Signed)
Patient ID: Brian [name], male   DOB: 1976-10-24, 40 y.o.   MRN: [id number] Anticoagulation Management Brian [name] is a 40 y.o. male who reports to the clinic for monitoring of warfarin treatment.    Indication: DVT and PE Duration: indefinite  Anticoagulation Clinic Visit History: Patient does report signs/symptoms of bleeding from teeth, ears, and coughing. Reports that this is consistent with what has normally happened before. Also reports bleeding more than normal after HD and bruising at HD site. Anticoagulation Episode Summary    Current INR goal 2.0-3.0  Next INR check 02/08/2016  INR from last check 2.7 (12/28/2015)  Weekly max dose   Target end date Indefinite  INR check location   Preferred lab   Send INR reminders to    Indications  Deep vein thrombosis (HCC) (Resolved) [I82.409] History of deep vein thrombosis (Resolved) [Z86.718] PULMONARY EMBOLISM (Resolved) [I26.99] PULMONARY EMBOLISM (Resolved) [I26.99]        Comments        ASSESSMENT Recent Results: Recent results are below, the most recent result is correlated with a dose of 62.5 mg per week: Lab Results  Component Value Date   INR 2.3 01/11/2016   INR 2.7 12/28/2015   INR 1.5 11/30/2015   INR 1.5* 11/29/2015    INR today: Therapeutic  Anticoagulation Dosing: INR as of 01/11/2016 and Previous Dosing Information    INR Dt INR Goal [employer] Sun Mon Tue Wed Thu Fri Sat   01/11/2016 2.3 2-3 62.5 mg 10 mg 10 mg 7.5 mg 10 mg 7.5 mg 10 mg 7.5 mg    Anticoagulation Dose Instructions as of 01/11/2016      Total Sun Mon Tue Wed Thu Fri Sat   New Dose 62.5 mg 10 mg 10 mg 7.5 mg 10 mg 7.5 mg 10 mg 7.5 mg     (5 mg x 2)  (5 mg x 2)  (5 mg x 1.5)  (5 mg x 2)  (5 mg x 1.5)  (5 mg x 2)  (5 mg x 1.5)                           PLAN Weekly dose was unchanged by 0% to 62.5 mg per week   Patient advised to contact clinic or seek medical attention if signs/symptoms of bleeding or thromboembolism occur or  worsen.  Follow-up Follow-up on 02/08/2016.   Tanya Makhlouf  25 minutes spent face-to-face with the patient during the encounter. 50% of time spent on education. 50% of time was spent on assessment and plan.

## 2016-01-11 NOTE — Progress Notes (Signed)
Patient was seen in clinic with Tanya Makhlouf, PharmD candidate. I agree with the assessment and plan of care documented by Tanya. 

## 2016-01-12 DIAGNOSIS — N2581 Secondary hyperparathyroidism of renal origin: Secondary | ICD-10-CM | POA: Diagnosis not present

## 2016-01-12 DIAGNOSIS — N186 End stage renal disease: Secondary | ICD-10-CM | POA: Diagnosis not present

## 2016-01-12 DIAGNOSIS — D631 Anemia in chronic kidney disease: Secondary | ICD-10-CM | POA: Diagnosis not present

## 2016-01-12 DIAGNOSIS — D509 Iron deficiency anemia, unspecified: Secondary | ICD-10-CM | POA: Diagnosis not present

## 2016-01-12 NOTE — Progress Notes (Signed)
INTERNAL MEDICINE TEACHING ATTENDING ADDENDUM - Lalla Brothers M.D  Duration- indefinite, Indication- PE, INR- therapeutic. Agree with pharmacy recommendations as outlined in their note.

## 2016-01-15 DIAGNOSIS — N2581 Secondary hyperparathyroidism of renal origin: Secondary | ICD-10-CM | POA: Diagnosis not present

## 2016-01-15 DIAGNOSIS — N186 End stage renal disease: Secondary | ICD-10-CM | POA: Diagnosis not present

## 2016-01-15 DIAGNOSIS — D631 Anemia in chronic kidney disease: Secondary | ICD-10-CM | POA: Diagnosis not present

## 2016-01-15 DIAGNOSIS — D509 Iron deficiency anemia, unspecified: Secondary | ICD-10-CM | POA: Diagnosis not present

## 2016-01-16 DIAGNOSIS — N186 End stage renal disease: Secondary | ICD-10-CM | POA: Diagnosis not present

## 2016-01-16 DIAGNOSIS — Z992 Dependence on renal dialysis: Secondary | ICD-10-CM | POA: Diagnosis not present

## 2016-01-16 DIAGNOSIS — I120 Hypertensive chronic kidney disease with stage 5 chronic kidney disease or end stage renal disease: Secondary | ICD-10-CM | POA: Diagnosis not present

## 2016-01-17 DIAGNOSIS — N186 End stage renal disease: Secondary | ICD-10-CM | POA: Diagnosis not present

## 2016-01-17 DIAGNOSIS — N2581 Secondary hyperparathyroidism of renal origin: Secondary | ICD-10-CM | POA: Diagnosis not present

## 2016-01-17 DIAGNOSIS — D509 Iron deficiency anemia, unspecified: Secondary | ICD-10-CM | POA: Diagnosis not present

## 2016-01-17 DIAGNOSIS — D631 Anemia in chronic kidney disease: Secondary | ICD-10-CM | POA: Diagnosis not present

## 2016-01-19 DIAGNOSIS — D509 Iron deficiency anemia, unspecified: Secondary | ICD-10-CM | POA: Diagnosis not present

## 2016-01-19 DIAGNOSIS — N2581 Secondary hyperparathyroidism of renal origin: Secondary | ICD-10-CM | POA: Diagnosis not present

## 2016-01-19 DIAGNOSIS — D631 Anemia in chronic kidney disease: Secondary | ICD-10-CM | POA: Diagnosis not present

## 2016-01-19 DIAGNOSIS — N186 End stage renal disease: Secondary | ICD-10-CM | POA: Diagnosis not present

## 2016-01-22 DIAGNOSIS — N186 End stage renal disease: Secondary | ICD-10-CM | POA: Diagnosis not present

## 2016-01-22 DIAGNOSIS — N2581 Secondary hyperparathyroidism of renal origin: Secondary | ICD-10-CM | POA: Diagnosis not present

## 2016-01-22 DIAGNOSIS — D509 Iron deficiency anemia, unspecified: Secondary | ICD-10-CM | POA: Diagnosis not present

## 2016-01-22 DIAGNOSIS — D631 Anemia in chronic kidney disease: Secondary | ICD-10-CM | POA: Diagnosis not present

## 2016-01-24 DIAGNOSIS — D509 Iron deficiency anemia, unspecified: Secondary | ICD-10-CM | POA: Diagnosis not present

## 2016-01-24 DIAGNOSIS — N2581 Secondary hyperparathyroidism of renal origin: Secondary | ICD-10-CM | POA: Diagnosis not present

## 2016-01-24 DIAGNOSIS — N186 End stage renal disease: Secondary | ICD-10-CM | POA: Diagnosis not present

## 2016-01-24 DIAGNOSIS — D631 Anemia in chronic kidney disease: Secondary | ICD-10-CM | POA: Diagnosis not present

## 2016-01-24 DIAGNOSIS — I8291 Chronic embolism and thrombosis of unspecified vein: Secondary | ICD-10-CM | POA: Diagnosis not present

## 2016-01-25 LAB — PROTIME-INR: INR: 2.71

## 2016-01-26 DIAGNOSIS — D509 Iron deficiency anemia, unspecified: Secondary | ICD-10-CM | POA: Diagnosis not present

## 2016-01-26 DIAGNOSIS — N2581 Secondary hyperparathyroidism of renal origin: Secondary | ICD-10-CM | POA: Diagnosis not present

## 2016-01-26 DIAGNOSIS — D631 Anemia in chronic kidney disease: Secondary | ICD-10-CM | POA: Diagnosis not present

## 2016-01-26 DIAGNOSIS — N186 End stage renal disease: Secondary | ICD-10-CM | POA: Diagnosis not present

## 2016-01-30 DIAGNOSIS — D631 Anemia in chronic kidney disease: Secondary | ICD-10-CM | POA: Diagnosis not present

## 2016-01-30 DIAGNOSIS — N186 End stage renal disease: Secondary | ICD-10-CM | POA: Diagnosis not present

## 2016-01-30 DIAGNOSIS — D509 Iron deficiency anemia, unspecified: Secondary | ICD-10-CM | POA: Diagnosis not present

## 2016-01-30 DIAGNOSIS — N2581 Secondary hyperparathyroidism of renal origin: Secondary | ICD-10-CM | POA: Diagnosis not present

## 2016-01-31 DIAGNOSIS — D631 Anemia in chronic kidney disease: Secondary | ICD-10-CM | POA: Diagnosis not present

## 2016-01-31 DIAGNOSIS — N186 End stage renal disease: Secondary | ICD-10-CM | POA: Diagnosis not present

## 2016-01-31 DIAGNOSIS — N2581 Secondary hyperparathyroidism of renal origin: Secondary | ICD-10-CM | POA: Diagnosis not present

## 2016-01-31 DIAGNOSIS — D509 Iron deficiency anemia, unspecified: Secondary | ICD-10-CM | POA: Diagnosis not present

## 2016-02-02 DIAGNOSIS — N186 End stage renal disease: Secondary | ICD-10-CM | POA: Diagnosis not present

## 2016-02-02 DIAGNOSIS — D509 Iron deficiency anemia, unspecified: Secondary | ICD-10-CM | POA: Diagnosis not present

## 2016-02-02 DIAGNOSIS — D631 Anemia in chronic kidney disease: Secondary | ICD-10-CM | POA: Diagnosis not present

## 2016-02-02 DIAGNOSIS — N2581 Secondary hyperparathyroidism of renal origin: Secondary | ICD-10-CM | POA: Diagnosis not present

## 2016-02-05 ENCOUNTER — Other Ambulatory Visit: Payer: Self-pay | Admitting: Internal Medicine

## 2016-02-05 ENCOUNTER — Other Ambulatory Visit: Payer: Self-pay | Admitting: *Deleted

## 2016-02-05 DIAGNOSIS — D509 Iron deficiency anemia, unspecified: Secondary | ICD-10-CM | POA: Diagnosis not present

## 2016-02-05 DIAGNOSIS — N186 End stage renal disease: Secondary | ICD-10-CM | POA: Diagnosis not present

## 2016-02-05 DIAGNOSIS — N2581 Secondary hyperparathyroidism of renal origin: Secondary | ICD-10-CM | POA: Diagnosis not present

## 2016-02-05 DIAGNOSIS — Q613 Polycystic kidney, unspecified: Secondary | ICD-10-CM

## 2016-02-05 DIAGNOSIS — D631 Anemia in chronic kidney disease: Secondary | ICD-10-CM | POA: Diagnosis not present

## 2016-02-05 NOTE — Telephone Encounter (Signed)
Last fill 12/15 Last visit 12/15, needs appt, sent to [facility] for scheduling Needs UDS

## 2016-02-06 NOTE — Telephone Encounter (Signed)
Made appt 3/16

## 2016-02-06 NOTE — Telephone Encounter (Signed)
I called Brian [name] to inform him that I will be available for an afternoon clinic this Thursday 2/23 to discuss pain management. I will discuss his percocet prescription and providing him a naloxone pen at that visit.   [name], any chance you could put him in this Thursday (2/23)? I will be available for clinic.

## 2016-02-07 ENCOUNTER — Other Ambulatory Visit: Payer: Self-pay | Admitting: Internal Medicine

## 2016-02-07 ENCOUNTER — Other Ambulatory Visit: Payer: Self-pay | Admitting: Cardiology

## 2016-02-07 DIAGNOSIS — N2581 Secondary hyperparathyroidism of renal origin: Secondary | ICD-10-CM | POA: Diagnosis not present

## 2016-02-07 DIAGNOSIS — N186 End stage renal disease: Secondary | ICD-10-CM | POA: Diagnosis not present

## 2016-02-07 DIAGNOSIS — Z9109 Other allergy status, other than to drugs and biological substances: Secondary | ICD-10-CM

## 2016-02-07 DIAGNOSIS — D631 Anemia in chronic kidney disease: Secondary | ICD-10-CM | POA: Diagnosis not present

## 2016-02-07 DIAGNOSIS — D509 Iron deficiency anemia, unspecified: Secondary | ICD-10-CM | POA: Diagnosis not present

## 2016-02-07 NOTE — Telephone Encounter (Signed)
Had to LVM, advised to call clinic if appointment does not work

## 2016-02-07 NOTE — Telephone Encounter (Signed)
Scheduled 1:15- called to inform patient

## 2016-02-07 NOTE — Telephone Encounter (Signed)
REFILL 

## 2016-02-07 NOTE — Telephone Encounter (Signed)
lvm rescheduling for 9:15

## 2016-02-08 ENCOUNTER — Encounter: Payer: Self-pay | Admitting: Internal Medicine

## 2016-02-08 ENCOUNTER — Ambulatory Visit (INDEPENDENT_AMBULATORY_CARE_PROVIDER_SITE_OTHER): Payer: Medicare Other | Admitting: Internal Medicine

## 2016-02-08 ENCOUNTER — Other Ambulatory Visit: Payer: Self-pay | Admitting: *Deleted

## 2016-02-08 VITALS — BP 113/76 | HR 110 | Temp 97.5°F | Wt 183.0 lb

## 2016-02-08 DIAGNOSIS — Z6823 Body mass index (BMI) 23.0-23.9, adult: Secondary | ICD-10-CM

## 2016-02-08 DIAGNOSIS — R630 Anorexia: Secondary | ICD-10-CM | POA: Insufficient documentation

## 2016-02-08 DIAGNOSIS — F17200 Nicotine dependence, unspecified, uncomplicated: Secondary | ICD-10-CM

## 2016-02-08 DIAGNOSIS — N186 End stage renal disease: Secondary | ICD-10-CM

## 2016-02-08 DIAGNOSIS — Q613 Polycystic kidney, unspecified: Secondary | ICD-10-CM

## 2016-02-08 DIAGNOSIS — F17210 Nicotine dependence, cigarettes, uncomplicated: Secondary | ICD-10-CM | POA: Diagnosis not present

## 2016-02-08 DIAGNOSIS — B20 Human immunodeficiency virus [HIV] disease: Secondary | ICD-10-CM

## 2016-02-08 DIAGNOSIS — G4700 Insomnia, unspecified: Secondary | ICD-10-CM | POA: Diagnosis not present

## 2016-02-08 MED ORDER — EFAVIRENZ 600 MG PO TABS: ORAL_TABLET | ORAL | Status: DC

## 2016-02-08 MED ORDER — OXYCODONE-ACETAMINOPHEN 10-325 MG PO TABS: 1.0000 | ORAL_TABLET | ORAL | Status: DC | PRN

## 2016-02-08 MED ORDER — ABACAVIR SULFATE 300 MG PO TABS: ORAL_TABLET | ORAL | Status: DC

## 2016-02-08 MED ORDER — CLONAZEPAM 0.5 MG PO TABS: ORAL_TABLET | ORAL | Status: DC

## 2016-02-08 MED ORDER — DRONABINOL 2.5 MG PO CAPS: 2.5000 mg | ORAL_CAPSULE | Freq: Two times a day (BID) | ORAL | Status: DC

## 2016-02-08 MED ORDER — LAMIVUDINE 100 MG PO TABS: ORAL_TABLET | ORAL | Status: DC

## 2016-02-08 MED ORDER — NICOTINE POLACRILEX 4 MG MT LOZG: 4.0000 mg | LOZENGE | OROMUCOSAL | Status: DC | PRN

## 2016-02-08 NOTE — Progress Notes (Signed)
Subjective:    Patient ID: Brian [name], male    DOB: Feb 25, 1976, 40 y.o.   MRN: [id number]  HPI  [name] is a gentleman with a PMH as below who comes to the clinic to discuss loss of appetite and possible weight loss. He says this has been a longstanding problem with his polycystic kidney disease and subsequent renal failure. He reports that he has to force himself to eat two cans of beans per day. He says he started to lose weight in 2014, when he weighed >200 lbs, and he had steadily lost weight to 183 today. He indicates this wasn't due to volume overload in the setting ESRD, and it represents true weight loss. He says he used to finish a steak and potatoes dinner without trouble, but now it seems like an impossible task. He denies any fever or night sweats. He endorses some occasional dysgeusia, but he says this isn't what is driving his appetite loss. He wonders if there is anything that can be done about his appetite.   He reports that his pain is well controlled with percocet. He indicates that it helps him to lead a fuller, more productive life when he is not in pain. He denies any somnolence on the medication.   [name] says he continues to smoke, but has cut back to 6 cigarettes per day. He uses the patch, but he is unable to chew his gum due to missing teeth.   Active Ambulatory Problems    Diagnosis Date Noted  . Human immunodeficiency virus (HIV) disease ([place]) 02/28/2010  . 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 Polycystic kidney 03/23/2010  . Long term current use of anticoagulant 01/05/2011  . ESRD (end stage renal disease) ([place]) 12/17/2010  . 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 Polycystic liver disease 10/28/2013  . Chronic systolic heart failure ([place]) 01/13/2014  . Erectile dysfunction 04/02/2014  . Abdominal hernia 06/14/2013  . Awaiting organ transplant 06/14/2013  . TOBACCO ABUSE 06/15/2013  . Insomnia 10/14/2014  . Spasm of abdominal muscles of right  side 04/28/2015  . Hypotension 09/28/2015  . Restless leg syndrome 09/28/2015  . Environmental allergies 11/29/2015  . Appetite loss 02/08/2016   Resolved Ambulatory Problems    Diagnosis Date Noted  . DEPRESSION, ACUTE 08/13/2010  . ALCOHOL ABUSE 11/07/2010  . TOBACCO ABUSE 10/01/2010  . ESSENTIAL HYPERTENSION 10/01/2010  . PULMONARY EMBOLISM 09/30/2010  . PULMONARY NODULE 10/04/2010  . DENTAL CARIES 11/07/2010  . KNEE PAIN, LEFT 03/23/2010  . SKIN RASH 08/13/2010  . LUMBAR SPRAIN AND STRAIN 11/07/2010  . ACL tear 02/07/2011  . MRSA cellulitis 03/04/2011  . CELLULITIS, LEG, LEFT 02/27/2011  . Chronic pain 07/30/2011  . Cellulitis of right leg 09/02/2011  . History of DVT (deep vein thrombosis) 10/14/2011  . Diarrhea 12/03/2011  . End stage renal disease ([place]) 12/12/2011  . Supratherapeutic INR 03/22/2012  . Hematuria, gross 03/22/2012  . Acute-on-chronic kidney injury ([place]) 03/22/2012  . Acute left flank pain 04/15/2012  . Skin rash 07/06/2012  . Long term (current) use of anticoagulants 08/24/2012  . Acute pyelonephritis, complicated [id number]  . Gross hematuria 09/07/2012  . Left ankle pain 05/31/2013  . Hemarthrosis involving ankle joint 06/06/2013  . Hemorrhoid 07/20/2013  . Pneumonia 07/29/2013  . Hyponatremia 07/29/2013  . Metabolic acidosis 0000000  . Macrocytic anemia 07/29/2013  . Hemoptysis 08/04/2013  . Other pancytopenia ([place]) 08/12/2013  . Loss of weight 08/31/2013  . Umbilical hernia [id number]  . Epigastric hernia 10/28/2013  .  Hernia, inguinal, left (possible bilateral) 10/28/2013  . Diastasis recti 10/28/2013  . Tobacco abuse 10/28/2013  . Perirectal discomfort 10/29/2013  . Condyloma 11/09/2013  . Abnormal resting ECG findings 11/23/2013  . Poor dentition 07/15/2014  . AA (aortic aneurysm) ([place]) 06/14/2013  . Chronic kidney failure 08/15/2014  . Deep vein thrombosis ([place]) 08/15/2014  . 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  PULMONARY EMBOLISM 08/15/2014  . Abnormal ECG 08/16/2014  . Preoperative cardiovascular examination 08/16/2014  . Screening examination for venereal disease 12/22/2014  . Encounter for long-term (current) use of medications 12/22/2014  . Hyperkalemia 04/23/2015  . Acute hyperkalemia 04/23/2015  . Depression 04/28/2015  . Acute maxillary sinusitis 05/09/2015  . History of pulmonary embolus (PE) 09/28/2015  . History of deep vein thrombosis 09/28/2015   Past Medical History  Diagnosis Date  . Hypertension   . Alcohol abuse   . Pulmonary nodule 10/11  . Thoracic aortic aneurysm ([place]) 10/11  . Pulmonary embolism ([place]) 10/11  . HIV (human immunodeficiency virus infection) ([place]) 4/11  . Anemia   . Thyroid disease   . DVT, lower extremity ([place]) early 2000's  . Neuromuscular disorder ([place])   . Chronic back pain   . Shortness of breath on exertion   . Chronic kidney disease, stage IV (severe) ([place])   . Polycystic kidney disease   . H/O hiatal hernia   . Anxiety   . Clotting disorder ([place])   . Heart murmur   . Immune deficiency disorder ([place])   . Renal insufficiency     Review of Systems  Constitutional: Positive for appetite change. Negative for fever and fatigue.  HENT: Negative for postnasal drip and sore throat.   Eyes: Negative for redness and visual disturbance.  Respiratory: Negative for cough and shortness of breath.   Cardiovascular: Negative for chest pain and leg swelling.  Gastrointestinal: Positive for abdominal pain. Negative for constipation.  Endocrine: Negative for polydipsia and polyuria.  Genitourinary: Positive for decreased urine volume. Negative for hematuria.  Musculoskeletal: Negative for back pain and arthralgias.  Skin: Negative for pallor and rash.  Allergic/Immunologic: Negative for environmental allergies.  Neurological: Negative for dizziness and headaches.  Psychiatric/Behavioral: Negative for dysphoric mood. The patient is not nervous/anxious.          Objective:   Physical Exam  Constitutional: No distress.  Thin appearing man.  HENT:  Head: Normocephalic and atraumatic.  Mouth/Throat: Oropharynx is clear and moist. No oropharyngeal exudate.  Edentulous.  Eyes: Conjunctivae are normal. No scleral icterus.  Neck: Normal range of motion. Neck supple.  Fistula extending to right lower neck.  Cardiovascular:  Continuous murmur. Palpable thrill.  Pulmonary/Chest: Effort normal and breath sounds normal. No respiratory distress. He has no wheezes.  Abdominal: Soft. Bowel sounds are normal.  Diffuse TTP.   Musculoskeletal: He exhibits no edema or tenderness.  Neurological: He is alert. He has normal reflexes.  Skin: Skin is warm and dry.  Psychiatric: He has a normal mood and affect. His behavior is normal.  Vitals reviewed.         Assessment & Plan:  Please see problem-based assessment and plan

## 2016-02-08 NOTE — Assessment & Plan Note (Signed)
A. We discussed smoking cessation for 5 minutes. Tobacco use is stable from last visit, but decreased overall. One barrier to using nicotine gum is that he is edentulous on the top row.  P: Replace gum with nicotine lozenge.

## 2016-02-08 NOTE — Assessment & Plan Note (Signed)
A: Patient has not requested his pain medication any sooner than as prescribed. The medications also make him more functional and do not induce somnolence. I will provide him a naloxone pen and perform and random drug screen the next visit in a month.  P - Refill 2 months of percocet 10-325 q4h prn pain - Naloxone pen in 1 month - Random UDS in 1 month.

## 2016-02-08 NOTE — Assessment & Plan Note (Signed)
A: Appetite loss is likely due to his PKD and ESRD. He is also noted to have a history of protein calorie malnutrition with Albumins to 1.9 in the past. According to the literature, there is a a role, albeit poorly studied, for cannabinoids for the management of appetite loss in ESRD.   P: Dronabinol 2.5 mg BID QC. This dose can be increased if necessary.

## 2016-02-08 NOTE — Patient Instructions (Addendum)
Brian [name], it was a pleasure seeing you today.  We're going to try to cut back on the the Klonopin. You said that you've been taking 1 mg every other night. We're going to cut back to 0.5 mg every other night for a month. The next visit, we'll discuss coming off of it.  You indicated that you've been having difficulty keeping your appetite up due to your kidney problems and that you've had difficulty in the past maintaining your weight. To address this, I am prescribing Marinol, which you will take twice daily. Next visit, we'll see how effective that has been.  You've cut back significantly on your tobacco use, now only smoking 6-7 cigarettes per day. You have been using the patch, but have had difficulty with the gum. We will transition to a nicotine lozenge instead.  We'll see you back in about a month.

## 2016-02-08 NOTE — Assessment & Plan Note (Signed)
A: Patient has deviated slightly from our plan in December. Instead of taking 0.25 mg klonopin nightly, he is taking 1 mg every other night. Overall, this is still downtrending from his previous use. We will transition to 0.5 mg klonopin every other night, and he is agreeable to coming off of it next month. He has successfully weaned off of ambien.  P: Klonopin 0.5 mg every other night

## 2016-02-09 ENCOUNTER — Telehealth: Payer: Self-pay | Admitting: *Deleted

## 2016-02-09 DIAGNOSIS — D509 Iron deficiency anemia, unspecified: Secondary | ICD-10-CM | POA: Diagnosis not present

## 2016-02-09 DIAGNOSIS — D631 Anemia in chronic kidney disease: Secondary | ICD-10-CM | POA: Diagnosis not present

## 2016-02-09 DIAGNOSIS — N2581 Secondary hyperparathyroidism of renal origin: Secondary | ICD-10-CM | POA: Diagnosis not present

## 2016-02-09 DIAGNOSIS — N186 End stage renal disease: Secondary | ICD-10-CM | POA: Diagnosis not present

## 2016-02-09 NOTE — Telephone Encounter (Signed)
Received faxed PA request from pt's pharmacy for the dronabinol 2.5mg  capsules one twice daily # 60.  Pt has diagnosis of HIV with loss of appetite.  Medication approved through the end of the year 12/15/2016.  Will inform both patient and pharmacy.Regenia Skeeter, Jamie Hafford Cassady2/24/201712:19 PM

## 2016-02-12 DIAGNOSIS — N186 End stage renal disease: Secondary | ICD-10-CM | POA: Diagnosis not present

## 2016-02-12 DIAGNOSIS — D509 Iron deficiency anemia, unspecified: Secondary | ICD-10-CM | POA: Diagnosis not present

## 2016-02-12 DIAGNOSIS — D631 Anemia in chronic kidney disease: Secondary | ICD-10-CM | POA: Diagnosis not present

## 2016-02-12 DIAGNOSIS — N2581 Secondary hyperparathyroidism of renal origin: Secondary | ICD-10-CM | POA: Diagnosis not present

## 2016-02-12 NOTE — Progress Notes (Signed)
Internal Medicine Clinic Attending  Case discussed with Dr. Ford at the time of the visit.  We reviewed the resident's history and exam and pertinent patient test results.  I agree with the assessment, diagnosis, and plan of care documented in the resident's note.  

## 2016-02-13 DIAGNOSIS — Z992 Dependence on renal dialysis: Secondary | ICD-10-CM | POA: Diagnosis not present

## 2016-02-13 DIAGNOSIS — N186 End stage renal disease: Secondary | ICD-10-CM | POA: Diagnosis not present

## 2016-02-13 DIAGNOSIS — I120 Hypertensive chronic kidney disease with stage 5 chronic kidney disease or end stage renal disease: Secondary | ICD-10-CM | POA: Diagnosis not present

## 2016-02-13 NOTE — Addendum Note (Signed)
Addended by: Zetta Bills on: 02/13/2016 10:31 PM   Modules accepted: Level of Service

## 2016-02-14 DIAGNOSIS — Z23 Encounter for immunization: Secondary | ICD-10-CM | POA: Diagnosis not present

## 2016-02-14 DIAGNOSIS — D509 Iron deficiency anemia, unspecified: Secondary | ICD-10-CM | POA: Diagnosis not present

## 2016-02-14 DIAGNOSIS — N186 End stage renal disease: Secondary | ICD-10-CM | POA: Diagnosis not present

## 2016-02-14 DIAGNOSIS — N2581 Secondary hyperparathyroidism of renal origin: Secondary | ICD-10-CM | POA: Diagnosis not present

## 2016-02-14 DIAGNOSIS — D631 Anemia in chronic kidney disease: Secondary | ICD-10-CM | POA: Diagnosis not present

## 2016-02-16 DIAGNOSIS — Z23 Encounter for immunization: Secondary | ICD-10-CM | POA: Diagnosis not present

## 2016-02-16 DIAGNOSIS — D631 Anemia in chronic kidney disease: Secondary | ICD-10-CM | POA: Diagnosis not present

## 2016-02-16 DIAGNOSIS — N2581 Secondary hyperparathyroidism of renal origin: Secondary | ICD-10-CM | POA: Diagnosis not present

## 2016-02-16 DIAGNOSIS — N186 End stage renal disease: Secondary | ICD-10-CM | POA: Diagnosis not present

## 2016-02-16 DIAGNOSIS — D509 Iron deficiency anemia, unspecified: Secondary | ICD-10-CM | POA: Diagnosis not present

## 2016-02-19 DIAGNOSIS — N186 End stage renal disease: Secondary | ICD-10-CM | POA: Diagnosis not present

## 2016-02-19 DIAGNOSIS — D631 Anemia in chronic kidney disease: Secondary | ICD-10-CM | POA: Diagnosis not present

## 2016-02-19 DIAGNOSIS — D509 Iron deficiency anemia, unspecified: Secondary | ICD-10-CM | POA: Diagnosis not present

## 2016-02-19 DIAGNOSIS — Z23 Encounter for immunization: Secondary | ICD-10-CM | POA: Diagnosis not present

## 2016-02-19 DIAGNOSIS — N2581 Secondary hyperparathyroidism of renal origin: Secondary | ICD-10-CM | POA: Diagnosis not present

## 2016-02-21 DIAGNOSIS — N186 End stage renal disease: Secondary | ICD-10-CM | POA: Diagnosis not present

## 2016-02-21 DIAGNOSIS — D509 Iron deficiency anemia, unspecified: Secondary | ICD-10-CM | POA: Diagnosis not present

## 2016-02-21 DIAGNOSIS — N2581 Secondary hyperparathyroidism of renal origin: Secondary | ICD-10-CM | POA: Diagnosis not present

## 2016-02-21 DIAGNOSIS — D631 Anemia in chronic kidney disease: Secondary | ICD-10-CM | POA: Diagnosis not present

## 2016-02-21 DIAGNOSIS — Z23 Encounter for immunization: Secondary | ICD-10-CM | POA: Diagnosis not present

## 2016-02-21 DIAGNOSIS — I8291 Chronic embolism and thrombosis of unspecified vein: Secondary | ICD-10-CM | POA: Diagnosis not present

## 2016-02-23 DIAGNOSIS — D631 Anemia in chronic kidney disease: Secondary | ICD-10-CM | POA: Diagnosis not present

## 2016-02-23 DIAGNOSIS — Z23 Encounter for immunization: Secondary | ICD-10-CM | POA: Diagnosis not present

## 2016-02-23 DIAGNOSIS — N2581 Secondary hyperparathyroidism of renal origin: Secondary | ICD-10-CM | POA: Diagnosis not present

## 2016-02-23 DIAGNOSIS — D509 Iron deficiency anemia, unspecified: Secondary | ICD-10-CM | POA: Diagnosis not present

## 2016-02-23 DIAGNOSIS — N186 End stage renal disease: Secondary | ICD-10-CM | POA: Diagnosis not present

## 2016-02-28 DIAGNOSIS — D631 Anemia in chronic kidney disease: Secondary | ICD-10-CM | POA: Diagnosis not present

## 2016-02-28 DIAGNOSIS — D509 Iron deficiency anemia, unspecified: Secondary | ICD-10-CM | POA: Diagnosis not present

## 2016-02-28 DIAGNOSIS — N2581 Secondary hyperparathyroidism of renal origin: Secondary | ICD-10-CM | POA: Diagnosis not present

## 2016-02-28 DIAGNOSIS — N186 End stage renal disease: Secondary | ICD-10-CM | POA: Diagnosis not present

## 2016-02-28 DIAGNOSIS — Z23 Encounter for immunization: Secondary | ICD-10-CM | POA: Diagnosis not present

## 2016-02-29 ENCOUNTER — Encounter: Payer: Self-pay | Admitting: Internal Medicine

## 2016-02-29 ENCOUNTER — Other Ambulatory Visit: Payer: Self-pay | Admitting: *Deleted

## 2016-02-29 ENCOUNTER — Ambulatory Visit (INDEPENDENT_AMBULATORY_CARE_PROVIDER_SITE_OTHER): Payer: Medicare Other | Admitting: Internal Medicine

## 2016-02-29 VITALS — BP 113/79 | HR 100 | Temp 97.5°F | Ht 74.0 in | Wt 185.7 lb

## 2016-02-29 DIAGNOSIS — G2581 Restless legs syndrome: Secondary | ICD-10-CM | POA: Diagnosis not present

## 2016-02-29 DIAGNOSIS — R630 Anorexia: Secondary | ICD-10-CM | POA: Diagnosis not present

## 2016-02-29 DIAGNOSIS — T82510D Breakdown (mechanical) of surgically created arteriovenous fistula, subsequent encounter: Secondary | ICD-10-CM

## 2016-02-29 DIAGNOSIS — Z6823 Body mass index (BMI) 23.0-23.9, adult: Secondary | ICD-10-CM

## 2016-02-29 DIAGNOSIS — G4700 Insomnia, unspecified: Secondary | ICD-10-CM

## 2016-02-29 DIAGNOSIS — R319 Hematuria, unspecified: Secondary | ICD-10-CM

## 2016-02-29 DIAGNOSIS — F1190 Opioid use, unspecified, uncomplicated: Secondary | ICD-10-CM

## 2016-02-29 DIAGNOSIS — Z79891 Long term (current) use of opiate analgesic: Secondary | ICD-10-CM | POA: Diagnosis not present

## 2016-02-29 DIAGNOSIS — Q613 Polycystic kidney, unspecified: Secondary | ICD-10-CM

## 2016-02-29 LAB — POCT URINALYSIS DIPSTICK
KETONES UA: 15
Nitrite, UA: NEGATIVE
PH UA: 8.5
Protein, UA: >=300
SPEC GRAV UA: 1.02
Urobilinogen, UA: 4

## 2016-02-29 MED ORDER — DRONABINOL 5 MG PO CAPS: 5.0000 mg | ORAL_CAPSULE | Freq: Two times a day (BID) | ORAL | Status: DC

## 2016-02-29 NOTE — Patient Instructions (Addendum)
Brian [name], it was a pleasure seeing you today. Your appetite isn't quite where it needs to be, so we're increasing your Marinol dose. Thank you for providing a urine sample for screening today. Please work with the Pharmacists at your INR visit to obtain naloxone.  Congratulations on stopping Klonopin!  We'll see you in a month.

## 2016-02-29 NOTE — Assessment & Plan Note (Signed)
A: We have successfully weaned down his Klonopin to nothing. We will continue to manage his RLS with pramipexole  P: Disconintue Klonopin

## 2016-02-29 NOTE — Assessment & Plan Note (Signed)
A: Patient had a ruptured cyst from his PKD last night and had resultant hematuria. He reports this is identical to episodes that he's had since he was 40 years old. He denies any fevers. This does not require any emergent workup without any constitutional symptoms or signs of symptomatic anemia. UA is consistent with hemoglobinuria.  With respect to his pain management for PKD, we will obtain a UDS today.  P: UDS today Continue pain management

## 2016-02-29 NOTE — Progress Notes (Signed)
Subjective:    Patient ID: Brian [name], male    DOB: October 16, 1976, 40 y.o.   MRN: [id number]  HPI  Mr. Pfuhl is a 40 yo man with PMH as described below who presents for follow up on his appetite loss and a recent episode of pain on the side of his abdomen. Yesterday he felt a "pop" and a "straight line" of pain. Afterward, this was followed urinating bright red blood that got darker as the day progressed. He reports that this similar to episodes he's had in the past, since he was 40 years old. He says he had 10/10 pain yesterday, now it is 6/10. He continues to smoke 5 cigarettes a day, and he says that his nicotine lozenges have not come through the mail. He reports the dronabinol has helped somewhat with his appetite loss, but he feels there is room for improvement. He feels he has gained some muscle after initiating this. He reports upper respiratory symptoms that started a week ago that have since resolved  (sore throat, runny nose).   He says that yesterday they overshot his dry weight at dialysis by 0.5 kg. Since he feels he is gaining muscle mass, he thinks that his dry weight needs to be adjusted.  He is amenable to doing a urine drug screen today.  He reports pramipexole has been effective for his sleep. He brought up coming off of klonopin today, and he wants to discontinue it.   Active Ambulatory Problems    Diagnosis Date Noted  . Human immunodeficiency virus (HIV) disease ([place]) 02/28/2010  . 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 Polycystic kidney 03/23/2010  . Long term current use of anticoagulant 01/05/2011  . ESRD (end stage renal disease) ([place]) 12/17/2010  . 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 Polycystic liver disease 10/28/2013  . Chronic systolic heart failure ([place]) 01/13/2014  . Erectile dysfunction 04/02/2014  . Abdominal hernia 06/14/2013  . Awaiting organ transplant 06/14/2013  . TOBACCO ABUSE 06/15/2013  . Insomnia 10/14/2014  . Spasm of abdominal  muscles of right side 04/28/2015  . Hypotension 09/28/2015  . Restless leg syndrome 09/28/2015  . Environmental allergies 11/29/2015  . Appetite loss 02/08/2016   Resolved Ambulatory Problems    Diagnosis Date Noted  . DEPRESSION, ACUTE 08/13/2010  . ALCOHOL ABUSE 11/07/2010  . TOBACCO ABUSE 10/01/2010  . ESSENTIAL HYPERTENSION 10/01/2010  . PULMONARY EMBOLISM 09/30/2010  . PULMONARY NODULE 10/04/2010  . DENTAL CARIES 11/07/2010  . KNEE PAIN, LEFT 03/23/2010  . SKIN RASH 08/13/2010  . LUMBAR SPRAIN AND STRAIN 11/07/2010  . ACL tear 02/07/2011  . MRSA cellulitis 03/04/2011  . CELLULITIS, LEG, LEFT 02/27/2011  . Chronic pain 07/30/2011  . Cellulitis of right leg 09/02/2011  . History of DVT (deep vein thrombosis) 10/14/2011  . Diarrhea 12/03/2011  . End stage renal disease ([place]) 12/12/2011  . Supratherapeutic INR 03/22/2012  . Hematuria, gross 03/22/2012  . Acute-on-chronic kidney injury ([place]) 03/22/2012  . Acute left flank pain 04/15/2012  . Skin rash 07/06/2012  . Long term (current) use of anticoagulants 08/24/2012  . Acute pyelonephritis, complicated Q000111Q  . Gross hematuria 09/07/2012  . Left ankle pain 05/31/2013  . Hemarthrosis involving ankle joint 06/06/2013  . Hemorrhoid 07/20/2013  . Pneumonia 07/29/2013  . Hyponatremia 07/29/2013  . Metabolic acidosis 0000000  . Macrocytic anemia 07/29/2013  . Hemoptysis 08/04/2013  . Other pancytopenia ([place]) 08/12/2013  . Loss of weight 08/31/2013  . Umbilical hernia [id number]  . Epigastric hernia 10/28/2013  .  Hernia, inguinal, left (possible bilateral) 10/28/2013  . Diastasis recti 10/28/2013  . Tobacco abuse 10/28/2013  . Perirectal discomfort 10/29/2013  . Condyloma 11/09/2013  . Abnormal resting ECG findings 11/23/2013  . Poor dentition 07/15/2014  . AA (aortic aneurysm) ([place]) 06/14/2013  . Chronic kidney failure 08/15/2014  . Deep vein thrombosis ([place]) 08/15/2014  . Congenital cystic disease of kidney  08/15/2014  . PULMONARY EMBOLISM 08/15/2014  . Abnormal ECG 08/16/2014  . Preoperative cardiovascular examination 08/16/2014  . Screening examination for venereal disease 12/22/2014  . Encounter for long-term (current) use of medications 12/22/2014  . Hyperkalemia 04/23/2015  . Acute hyperkalemia 04/23/2015  . Depression 04/28/2015  . Acute maxillary sinusitis 05/09/2015  . History of pulmonary embolus (PE) 09/28/2015  . History of deep vein thrombosis 09/28/2015   Past Medical History  Diagnosis Date  . Hypertension   . Alcohol abuse   . Pulmonary nodule 10/11  . Thoracic aortic aneurysm ([place]) 10/11  . Pulmonary embolism ([place]) 10/11  . HIV (human immunodeficiency virus infection) ([place]) 4/11  . Anemia   . Thyroid disease   . DVT, lower extremity ([place]) early 2000's  . Neuromuscular disorder ([place])   . Chronic back pain   . Shortness of breath on exertion   . Chronic kidney disease, stage IV (severe) ([place])   . Polycystic kidney disease   . H/O hiatal hernia   . Anxiety   . Clotting disorder ([place])   . Heart murmur   . Immune deficiency disorder ([place])   . Renal insufficiency      Review of Systems  Constitutional: Positive for fatigue. Negative for fever and chills.  HENT: Positive for dental problem. Negative for congestion.        Not tolerating dentures well. Doesn't wear very often.  Eyes: Negative for photophobia and visual disturbance.  Respiratory: Negative for cough and shortness of breath.   Cardiovascular: Negative for chest pain and leg swelling.  Gastrointestinal: Positive for abdominal pain. Negative for blood in stool.  Genitourinary: Positive for hematuria and flank pain. Negative for dysuria and urgency.  Neurological: Negative for light-headedness and headaches.  Psychiatric/Behavioral: Negative for dysphoric mood. The patient is not nervous/anxious.        Objective:   Physical Exam  Constitutional: He is oriented to person, place, and time. He  appears well-developed and well-nourished.  HENT:  Mouth/Throat: No oropharyngeal exudate.  Dentures in place  Eyes: Pupils are equal, round, and reactive to light. No scleral icterus.  Neck: Normal range of motion. Neck supple.  Cardiovascular:  Continuous murmur. AVF with palpable thrill.  Pulmonary/Chest: Effort normal and breath sounds normal. No respiratory distress. He has no wheezes.  Abdominal: Soft. Bowel sounds are normal.  TTP of right flank  Genitourinary:  Dark red urine  Neurological: He is alert and oriented to person, place, and time.  Skin: Skin is warm and dry. He is not diaphoretic. No erythema.  Psychiatric: He has a normal mood and affect. His behavior is normal.  Nursing note and vitals reviewed.         Assessment & Plan:   Please see problem based assessment and plan for details.

## 2016-02-29 NOTE — Assessment & Plan Note (Signed)
A: Patient has had improvement with Dronabinol, but he thinks there is room for improvement. He reports gaining some muscle mass since starting dronabinol.  P: Increase dronabinol to 5 mg BID. This will likely be his maximum dose.

## 2016-03-01 DIAGNOSIS — N186 End stage renal disease: Secondary | ICD-10-CM | POA: Diagnosis not present

## 2016-03-01 DIAGNOSIS — Z23 Encounter for immunization: Secondary | ICD-10-CM | POA: Diagnosis not present

## 2016-03-01 DIAGNOSIS — N2581 Secondary hyperparathyroidism of renal origin: Secondary | ICD-10-CM | POA: Diagnosis not present

## 2016-03-01 DIAGNOSIS — D631 Anemia in chronic kidney disease: Secondary | ICD-10-CM | POA: Diagnosis not present

## 2016-03-01 DIAGNOSIS — D509 Iron deficiency anemia, unspecified: Secondary | ICD-10-CM | POA: Diagnosis not present

## 2016-03-01 MED ORDER — NALOXONE HCL 4 MG/0.1ML NA LIQD: 1.0000 | Freq: Once | NASAL | Status: DC

## 2016-03-01 NOTE — Progress Notes (Signed)
Internal Medicine Clinic Attending  Case discussed with Dr. Ford at the time of the visit.  We reviewed the resident's history and exam and pertinent patient test results.  I agree with the assessment, diagnosis, and plan of care documented in the resident's note.  

## 2016-03-01 NOTE — Addendum Note (Signed)
Addended by: Forde Dandy on: 03/01/2016 05:20 PM   Modules accepted: Orders

## 2016-03-04 DIAGNOSIS — Z23 Encounter for immunization: Secondary | ICD-10-CM | POA: Diagnosis not present

## 2016-03-04 DIAGNOSIS — N186 End stage renal disease: Secondary | ICD-10-CM | POA: Diagnosis not present

## 2016-03-04 DIAGNOSIS — D509 Iron deficiency anemia, unspecified: Secondary | ICD-10-CM | POA: Diagnosis not present

## 2016-03-04 DIAGNOSIS — N2581 Secondary hyperparathyroidism of renal origin: Secondary | ICD-10-CM | POA: Diagnosis not present

## 2016-03-04 DIAGNOSIS — D631 Anemia in chronic kidney disease: Secondary | ICD-10-CM | POA: Diagnosis not present

## 2016-03-06 ENCOUNTER — Encounter: Payer: Self-pay | Admitting: Vascular Surgery

## 2016-03-06 DIAGNOSIS — D509 Iron deficiency anemia, unspecified: Secondary | ICD-10-CM | POA: Diagnosis not present

## 2016-03-06 DIAGNOSIS — Z23 Encounter for immunization: Secondary | ICD-10-CM | POA: Diagnosis not present

## 2016-03-06 DIAGNOSIS — D631 Anemia in chronic kidney disease: Secondary | ICD-10-CM | POA: Diagnosis not present

## 2016-03-06 DIAGNOSIS — N186 End stage renal disease: Secondary | ICD-10-CM | POA: Diagnosis not present

## 2016-03-06 DIAGNOSIS — N2581 Secondary hyperparathyroidism of renal origin: Secondary | ICD-10-CM | POA: Diagnosis not present

## 2016-03-08 ENCOUNTER — Other Ambulatory Visit: Payer: Self-pay | Admitting: Internal Medicine

## 2016-03-08 DIAGNOSIS — N2581 Secondary hyperparathyroidism of renal origin: Secondary | ICD-10-CM | POA: Diagnosis not present

## 2016-03-08 DIAGNOSIS — D509 Iron deficiency anemia, unspecified: Secondary | ICD-10-CM | POA: Diagnosis not present

## 2016-03-08 DIAGNOSIS — D631 Anemia in chronic kidney disease: Secondary | ICD-10-CM | POA: Diagnosis not present

## 2016-03-08 DIAGNOSIS — Q613 Polycystic kidney, unspecified: Secondary | ICD-10-CM

## 2016-03-08 DIAGNOSIS — N186 End stage renal disease: Secondary | ICD-10-CM | POA: Diagnosis not present

## 2016-03-08 DIAGNOSIS — Z23 Encounter for immunization: Secondary | ICD-10-CM | POA: Diagnosis not present

## 2016-03-08 LAB — URINALYSIS, ROUTINE W REFLEX MICROSCOPIC
Bilirubin, UA: NEGATIVE
NITRITE UA: POSITIVE — AB
PH UA: 7.5 (ref 5.0–7.5)
Specific Gravity, UA: 1.016 (ref 1.005–1.030)
UUROB: 1 mg/dL (ref 0.2–1.0)

## 2016-03-08 LAB — MICROSCOPIC EXAMINATION
CASTS: NONE SEEN /LPF
Epithelial Cells (non renal): NONE SEEN /hpf (ref 0–10)

## 2016-03-08 LAB — TOXASSURE SELECT,+ANTIDEPR,UR: PDF: 0

## 2016-03-10 ENCOUNTER — Telehealth: Payer: Self-pay | Admitting: Internal Medicine

## 2016-03-10 NOTE — Telephone Encounter (Signed)
I have attempted to call Brian [name] several times about returning to clinic for a blood draw, and he has not responded.

## 2016-03-11 ENCOUNTER — Inpatient Hospital Stay (HOSPITAL_COMMUNITY)
Admission: RE | Admit: 2016-03-11 | Discharge: 2016-03-11 | Disposition: A | Discharge status: Home / Self Care | Payer: Self-pay | Source: Ambulatory Visit

## 2016-03-11 DIAGNOSIS — N186 End stage renal disease: Secondary | ICD-10-CM | POA: Diagnosis not present

## 2016-03-11 DIAGNOSIS — Z23 Encounter for immunization: Secondary | ICD-10-CM | POA: Diagnosis not present

## 2016-03-11 DIAGNOSIS — N2581 Secondary hyperparathyroidism of renal origin: Secondary | ICD-10-CM | POA: Diagnosis not present

## 2016-03-11 DIAGNOSIS — D631 Anemia in chronic kidney disease: Secondary | ICD-10-CM | POA: Diagnosis not present

## 2016-03-11 DIAGNOSIS — D509 Iron deficiency anemia, unspecified: Secondary | ICD-10-CM | POA: Diagnosis not present

## 2016-03-11 DIAGNOSIS — T82510D Breakdown (mechanical) of surgically created arteriovenous fistula, subsequent encounter: Secondary | ICD-10-CM

## 2016-03-12 ENCOUNTER — Other Ambulatory Visit: Payer: Self-pay | Admitting: Internal Medicine

## 2016-03-12 ENCOUNTER — Ambulatory Visit: Payer: Self-pay | Admitting: Vascular Surgery

## 2016-03-13 DIAGNOSIS — N2581 Secondary hyperparathyroidism of renal origin: Secondary | ICD-10-CM | POA: Diagnosis not present

## 2016-03-13 DIAGNOSIS — D509 Iron deficiency anemia, unspecified: Secondary | ICD-10-CM | POA: Diagnosis not present

## 2016-03-13 DIAGNOSIS — D631 Anemia in chronic kidney disease: Secondary | ICD-10-CM | POA: Diagnosis not present

## 2016-03-13 DIAGNOSIS — Z23 Encounter for immunization: Secondary | ICD-10-CM | POA: Diagnosis not present

## 2016-03-13 DIAGNOSIS — N186 End stage renal disease: Secondary | ICD-10-CM | POA: Diagnosis not present

## 2016-03-15 DIAGNOSIS — Z23 Encounter for immunization: Secondary | ICD-10-CM | POA: Diagnosis not present

## 2016-03-15 DIAGNOSIS — N186 End stage renal disease: Secondary | ICD-10-CM | POA: Diagnosis not present

## 2016-03-15 DIAGNOSIS — N2581 Secondary hyperparathyroidism of renal origin: Secondary | ICD-10-CM | POA: Diagnosis not present

## 2016-03-15 DIAGNOSIS — D631 Anemia in chronic kidney disease: Secondary | ICD-10-CM | POA: Diagnosis not present

## 2016-03-15 DIAGNOSIS — D509 Iron deficiency anemia, unspecified: Secondary | ICD-10-CM | POA: Diagnosis not present

## 2016-03-15 DIAGNOSIS — Z992 Dependence on renal dialysis: Secondary | ICD-10-CM | POA: Diagnosis not present

## 2016-03-15 DIAGNOSIS — I120 Hypertensive chronic kidney disease with stage 5 chronic kidney disease or end stage renal disease: Secondary | ICD-10-CM | POA: Diagnosis not present

## 2016-03-18 DIAGNOSIS — D509 Iron deficiency anemia, unspecified: Secondary | ICD-10-CM | POA: Diagnosis not present

## 2016-03-18 DIAGNOSIS — N186 End stage renal disease: Secondary | ICD-10-CM | POA: Diagnosis not present

## 2016-03-18 DIAGNOSIS — N2581 Secondary hyperparathyroidism of renal origin: Secondary | ICD-10-CM | POA: Diagnosis not present

## 2016-03-18 DIAGNOSIS — D631 Anemia in chronic kidney disease: Secondary | ICD-10-CM | POA: Diagnosis not present

## 2016-03-20 DIAGNOSIS — N186 End stage renal disease: Secondary | ICD-10-CM | POA: Diagnosis not present

## 2016-03-20 DIAGNOSIS — D509 Iron deficiency anemia, unspecified: Secondary | ICD-10-CM | POA: Diagnosis not present

## 2016-03-20 DIAGNOSIS — D631 Anemia in chronic kidney disease: Secondary | ICD-10-CM | POA: Diagnosis not present

## 2016-03-20 DIAGNOSIS — N2581 Secondary hyperparathyroidism of renal origin: Secondary | ICD-10-CM | POA: Diagnosis not present

## 2016-03-21 ENCOUNTER — Ambulatory Visit (INDEPENDENT_AMBULATORY_CARE_PROVIDER_SITE_OTHER): Payer: Medicare Other | Admitting: Internal Medicine

## 2016-03-21 ENCOUNTER — Ambulatory Visit (INDEPENDENT_AMBULATORY_CARE_PROVIDER_SITE_OTHER): Payer: Medicare Other | Admitting: Pharmacist

## 2016-03-21 ENCOUNTER — Encounter: Payer: Self-pay | Admitting: Internal Medicine

## 2016-03-21 VITALS — BP 112/80 | HR 113 | Ht 74.0 in | Wt 175.5 lb

## 2016-03-21 DIAGNOSIS — Z0000 Encounter for general adult medical examination without abnormal findings: Secondary | ICD-10-CM

## 2016-03-21 DIAGNOSIS — F17200 Nicotine dependence, unspecified, uncomplicated: Secondary | ICD-10-CM

## 2016-03-21 DIAGNOSIS — Z7901 Long term (current) use of anticoagulants: Secondary | ICD-10-CM | POA: Diagnosis not present

## 2016-03-21 DIAGNOSIS — Z23 Encounter for immunization: Secondary | ICD-10-CM | POA: Diagnosis not present

## 2016-03-21 DIAGNOSIS — Z86711 Personal history of pulmonary embolism: Secondary | ICD-10-CM

## 2016-03-21 DIAGNOSIS — Q613 Polycystic kidney, unspecified: Secondary | ICD-10-CM | POA: Diagnosis not present

## 2016-03-21 DIAGNOSIS — J069 Acute upper respiratory infection, unspecified: Secondary | ICD-10-CM

## 2016-03-21 DIAGNOSIS — R630 Anorexia: Secondary | ICD-10-CM | POA: Diagnosis not present

## 2016-03-21 LAB — POCT INR: INR: 2.1

## 2016-03-21 MED ORDER — GUAIFENESIN ER 600 MG PO TB12: 600.0000 mg | ORAL_TABLET | Freq: Two times a day (BID) | ORAL | Status: DC

## 2016-03-21 MED ORDER — DRONABINOL 5 MG PO CAPS: 5.0000 mg | ORAL_CAPSULE | Freq: Two times a day (BID) | ORAL | Status: DC

## 2016-03-21 MED ORDER — NICOTINE POLACRILEX 4 MG MT LOZG: 4.0000 mg | LOZENGE | OROMUCOSAL | Status: DC | PRN

## 2016-03-21 MED ORDER — OXYCODONE-ACETAMINOPHEN 10-325 MG PO TABS: 1.0000 | ORAL_TABLET | ORAL | Status: DC | PRN

## 2016-03-21 NOTE — Progress Notes (Signed)
   Subjective:    Patient ID: Brian [name], male    DOB: 03-12-1976, 40 y.o.   MRN: [id number]  HPI  [name] comes to the clinic to discuss his anorexia, pain management for polycystic kidney disease, and recent upper respiratory symptoms.  The patient had noticed on his MyChart that his urine was negative for dronabinol, clonazepam, and oxycodone. The patient was shocked and could not believe this was possible. He indicated that while the clonazepam was weaned and discontinued, he takes the dronabinol and oxycodone every day. He indicates that he does not run out early, taking the oxycodone 2-4 times daily for his abdominal pain. He is particularly surprised because just prior to obtaining the urine, he had a ruptured cyst and was in pain. He said that those are the times he would definitely take an oxycodone. He denies distribution of his medications to any other individuals. He reports the dronabinol has been highly effective at restoring his appetite.  He endorses four days of cough, runny nose. He reports fever-like symptoms several days ago, but this has resolved.  Review of Systems  Constitutional: Negative for fever, chills and diaphoresis.  HENT: Positive for congestion and rhinorrhea. Negative for sore throat.   Respiratory: Positive for cough. Negative for shortness of breath and wheezing.   Cardiovascular: Negative for chest pain.  Gastrointestinal: Positive for abdominal pain. Negative for diarrhea.  Genitourinary: Negative for hematuria and decreased urine volume.  Neurological: Negative for dizziness and headaches.       Objective:   Physical Exam  Constitutional: No distress.  Thin appearing man.  HENT:  Head: Normocephalic and atraumatic.  Mouth/Throat: Oropharynx is clear and moist. No oropharyngeal exudate.  Edentulous.  Eyes: Conjunctivae are normal. No scleral icterus.  Neck: Normal range of motion. Neck supple.  Fistula extending to right lower neck.    Cardiovascular:  Continuous murmur. Palpable thrill.  Pulmonary/Chest: Effort normal and breath sounds normal. No respiratory distress. He has no wheezes.  Abdominal: Soft. Bowel sounds are normal.  Diffuse TTP.  Musculoskeletal: He exhibits no edema or tenderness.  Neurological: He is alert. He has normal reflexes.  Skin: Skin is warm and dry.  Psychiatric: He has a normal mood and affect. His behavior is normal.  Vitals reviewed.      Assessment & Plan:  Please see problem based assessment and plan for details.

## 2016-03-21 NOTE — Patient Instructions (Signed)
Mr. Furnish,  It was a pleasure seeing you today. Make sure the next time they do a random drug screen that they do a blood test instead.  Congratulations on cutting back smoking. Hopefully with the Lozenges you'll be able to quit 100%.  I'm prescribing Mucinex for your cold.

## 2016-03-21 NOTE — Patient Instructions (Signed)
Patient educated about medication as defined in this encounter and verbalized understanding by repeating back instructions provided.   

## 2016-03-21 NOTE — Progress Notes (Signed)
Anticoagulation Management Brian [name] is a 40 y.o. male who reports to the clinic for monitoring of warfarin treatment.    Indication: DVT and PEhistory Duration: indefinite  Anticoagulation Clinic Visit History: Patient does not report signs/symptoms of bleeding or thromboembolism. He states he was having hematuria approx. 2 weeks ago but this has resolved. At that time he held doses of warfarin; over the past 7 days, he states he has taken warfarin as prescribed.   Anticoagulation Episode Summary    Current INR goal 2.0-3.0  Next INR check 04/11/2016  INR from last check 2.1 (03/21/2016)  Weekly max dose   Target end date Indefinite  INR check location   Preferred lab   Send INR reminders to    Indications  Deep vein thrombosis (HCC) (Resolved) [I82.409] History of deep vein thrombosis (Resolved) [Z86.718] PULMONARY EMBOLISM (Resolved) [I26.99] PULMONARY EMBOLISM (Resolved) [I26.99]        Comments        ASSESSMENT Recent Results: The most recent result is correlated with 62.5 mg per week: Lab Results  Component Value Date   INR 2.1 03/21/2016   INR 2.3 01/11/2016   INR 2.7 12/28/2015   Anticoagulation Dosing: INR as of 03/21/2016 and Previous Dosing Information    INR Dt INR Goal [employer] Sun Mon Tue Wed Thu Fri Sat   03/21/2016 2.1 2.0-3.0 62.5 mg 10 mg 10 mg 7.5 mg 10 mg 7.5 mg 10 mg 7.5 mg    Anticoagulation Dose Instructions as of 03/21/2016      Total Sun Mon Tue Wed Thu Fri Sat   New Dose 62.5 mg 10 mg 10 mg 7.5 mg 10 mg 7.5 mg 10 mg 7.5 mg     (5 mg x 2)  (5 mg x 2)  (5 mg x 1.5)  (5 mg x 2)  (5 mg x 1.5)  (5 mg x 2)  (5 mg x 1.5)                           INR today: Therapeutic  PLAN Weekly dose was unchanged   Patient Instructions  Patient educated about medication as defined in this encounter and verbalized understanding by repeating back instructions provided.    Patient advised to contact clinic or seek medical attention if signs/symptoms  of bleeding or thromboembolism occur.  Patient verbalized understanding by repeating back information and was advised to contact me if further medication-related questions arise. Patient was also provided an information handout.  Follow-up No Follow-up on file.  Ciana Simmon J

## 2016-03-22 ENCOUNTER — Encounter: Payer: Self-pay | Admitting: Surgery

## 2016-03-22 DIAGNOSIS — N186 End stage renal disease: Secondary | ICD-10-CM | POA: Diagnosis not present

## 2016-03-22 DIAGNOSIS — D631 Anemia in chronic kidney disease: Secondary | ICD-10-CM | POA: Diagnosis not present

## 2016-03-22 DIAGNOSIS — N2581 Secondary hyperparathyroidism of renal origin: Secondary | ICD-10-CM | POA: Diagnosis not present

## 2016-03-22 DIAGNOSIS — D509 Iron deficiency anemia, unspecified: Secondary | ICD-10-CM | POA: Diagnosis not present

## 2016-03-22 NOTE — Assessment & Plan Note (Signed)
A: Patient is currently convalescing from URI.  P: Guaifenesin 600 mg q12h

## 2016-03-22 NOTE — Assessment & Plan Note (Signed)
Tdap needed today and given.

## 2016-03-22 NOTE — Assessment & Plan Note (Signed)
2nd PPSV-23 given today given HIV status.

## 2016-03-22 NOTE — Assessment & Plan Note (Signed)
A: Patient's appetite improved on dronabinol.  P: Refill dronabinol 5 mg BID

## 2016-03-22 NOTE — Assessment & Plan Note (Signed)
Patient down to 2 cigarettes a day. Pt has not yet received nicotine lozenge. Pharmacy only refilling gum. I called the pharmacy and this error is corrected

## 2016-03-22 NOTE — Assessment & Plan Note (Addendum)
A: Patient is adamant that he is taking his oxycodone and dronabinol. I have spoken with [facility] as described in the Result Note, which invoked the possibility of his ESRD or dialysis causing a false negative. However, the patient says that he took his oxycodone after his last dialysis session and before the urine drug screen. I counseled the patient that should we continue to obtain inappropriately negative results on his random drug screens, then we may have to wean down his pain regimen. He expressed understanding and agreed to this plan.   To decrease the probability of obtaining a false negative, his random drug screens should be BLOOD TESTS in the future. There is already a future order for this blood test to be applied at a future clinic appointment.  P: Updated FYI with new Red Flag and plan to ONLY use blood tests for future random drug screens Two months of Percocet refilled 10-325 mg q4h prn

## 2016-03-25 NOTE — Progress Notes (Signed)
Indication: Recurrent unprovoked venousthromboembolism.  Duration: Indefinite.  INR at target.  Agree with Dr. Julianne Rice assessment and plan as documented.

## 2016-03-26 NOTE — Progress Notes (Signed)
Internal Medicine Clinic Attending  Case discussed with Dr. Ford at the time of the visit.  We reviewed the resident's history and exam and pertinent patient test results.  I agree with the assessment, diagnosis, and plan of care documented in the resident's note.  

## 2016-03-27 DIAGNOSIS — N186 End stage renal disease: Secondary | ICD-10-CM | POA: Diagnosis not present

## 2016-03-27 DIAGNOSIS — N2581 Secondary hyperparathyroidism of renal origin: Secondary | ICD-10-CM | POA: Diagnosis not present

## 2016-03-27 DIAGNOSIS — D509 Iron deficiency anemia, unspecified: Secondary | ICD-10-CM | POA: Diagnosis not present

## 2016-03-27 DIAGNOSIS — D631 Anemia in chronic kidney disease: Secondary | ICD-10-CM | POA: Diagnosis not present

## 2016-03-27 DIAGNOSIS — I8291 Chronic embolism and thrombosis of unspecified vein: Secondary | ICD-10-CM | POA: Diagnosis not present

## 2016-03-29 DIAGNOSIS — N186 End stage renal disease: Secondary | ICD-10-CM | POA: Diagnosis not present

## 2016-03-29 DIAGNOSIS — D509 Iron deficiency anemia, unspecified: Secondary | ICD-10-CM | POA: Diagnosis not present

## 2016-03-29 DIAGNOSIS — D631 Anemia in chronic kidney disease: Secondary | ICD-10-CM | POA: Diagnosis not present

## 2016-03-29 DIAGNOSIS — N2581 Secondary hyperparathyroidism of renal origin: Secondary | ICD-10-CM | POA: Diagnosis not present

## 2016-04-01 ENCOUNTER — Ambulatory Visit: Payer: Medicare Other | Admitting: Surgery

## 2016-04-01 DIAGNOSIS — N186 End stage renal disease: Secondary | ICD-10-CM | POA: Diagnosis not present

## 2016-04-01 DIAGNOSIS — D631 Anemia in chronic kidney disease: Secondary | ICD-10-CM | POA: Diagnosis not present

## 2016-04-01 DIAGNOSIS — N2581 Secondary hyperparathyroidism of renal origin: Secondary | ICD-10-CM | POA: Diagnosis not present

## 2016-04-01 DIAGNOSIS — D509 Iron deficiency anemia, unspecified: Secondary | ICD-10-CM | POA: Diagnosis not present

## 2016-04-03 DIAGNOSIS — N186 End stage renal disease: Secondary | ICD-10-CM | POA: Diagnosis not present

## 2016-04-03 DIAGNOSIS — D631 Anemia in chronic kidney disease: Secondary | ICD-10-CM | POA: Diagnosis not present

## 2016-04-03 DIAGNOSIS — D509 Iron deficiency anemia, unspecified: Secondary | ICD-10-CM | POA: Diagnosis not present

## 2016-04-03 DIAGNOSIS — N2581 Secondary hyperparathyroidism of renal origin: Secondary | ICD-10-CM | POA: Diagnosis not present

## 2016-04-05 ENCOUNTER — Other Ambulatory Visit: Payer: Self-pay | Admitting: Internal Medicine

## 2016-04-05 ENCOUNTER — Other Ambulatory Visit: Payer: Self-pay | Admitting: Cardiology

## 2016-04-05 DIAGNOSIS — N186 End stage renal disease: Secondary | ICD-10-CM | POA: Diagnosis not present

## 2016-04-05 DIAGNOSIS — D631 Anemia in chronic kidney disease: Secondary | ICD-10-CM | POA: Diagnosis not present

## 2016-04-05 DIAGNOSIS — G2581 Restless legs syndrome: Secondary | ICD-10-CM

## 2016-04-05 DIAGNOSIS — N2581 Secondary hyperparathyroidism of renal origin: Secondary | ICD-10-CM | POA: Diagnosis not present

## 2016-04-05 DIAGNOSIS — D509 Iron deficiency anemia, unspecified: Secondary | ICD-10-CM | POA: Diagnosis not present

## 2016-04-05 DIAGNOSIS — M62838 Other muscle spasm: Secondary | ICD-10-CM

## 2016-04-08 DIAGNOSIS — D631 Anemia in chronic kidney disease: Secondary | ICD-10-CM | POA: Diagnosis not present

## 2016-04-08 DIAGNOSIS — N2581 Secondary hyperparathyroidism of renal origin: Secondary | ICD-10-CM | POA: Diagnosis not present

## 2016-04-08 DIAGNOSIS — D509 Iron deficiency anemia, unspecified: Secondary | ICD-10-CM | POA: Diagnosis not present

## 2016-04-08 DIAGNOSIS — N186 End stage renal disease: Secondary | ICD-10-CM | POA: Diagnosis not present

## 2016-04-10 DIAGNOSIS — N186 End stage renal disease: Secondary | ICD-10-CM | POA: Diagnosis not present

## 2016-04-10 DIAGNOSIS — D631 Anemia in chronic kidney disease: Secondary | ICD-10-CM | POA: Diagnosis not present

## 2016-04-10 DIAGNOSIS — N2581 Secondary hyperparathyroidism of renal origin: Secondary | ICD-10-CM | POA: Diagnosis not present

## 2016-04-10 DIAGNOSIS — D509 Iron deficiency anemia, unspecified: Secondary | ICD-10-CM | POA: Diagnosis not present

## 2016-04-12 DIAGNOSIS — D509 Iron deficiency anemia, unspecified: Secondary | ICD-10-CM | POA: Diagnosis not present

## 2016-04-12 DIAGNOSIS — N186 End stage renal disease: Secondary | ICD-10-CM | POA: Diagnosis not present

## 2016-04-12 DIAGNOSIS — N2581 Secondary hyperparathyroidism of renal origin: Secondary | ICD-10-CM | POA: Diagnosis not present

## 2016-04-12 DIAGNOSIS — D631 Anemia in chronic kidney disease: Secondary | ICD-10-CM | POA: Diagnosis not present

## 2016-04-14 DIAGNOSIS — Z992 Dependence on renal dialysis: Secondary | ICD-10-CM | POA: Diagnosis not present

## 2016-04-14 DIAGNOSIS — N186 End stage renal disease: Secondary | ICD-10-CM | POA: Diagnosis not present

## 2016-04-14 DIAGNOSIS — I120 Hypertensive chronic kidney disease with stage 5 chronic kidney disease or end stage renal disease: Secondary | ICD-10-CM | POA: Diagnosis not present

## 2016-04-16 ENCOUNTER — Ambulatory Visit: Payer: Self-pay | Admitting: Internal Medicine

## 2016-04-17 DIAGNOSIS — D509 Iron deficiency anemia, unspecified: Secondary | ICD-10-CM | POA: Diagnosis not present

## 2016-04-17 DIAGNOSIS — D631 Anemia in chronic kidney disease: Secondary | ICD-10-CM | POA: Diagnosis not present

## 2016-04-17 DIAGNOSIS — N2581 Secondary hyperparathyroidism of renal origin: Secondary | ICD-10-CM | POA: Diagnosis not present

## 2016-04-17 DIAGNOSIS — N186 End stage renal disease: Secondary | ICD-10-CM | POA: Diagnosis not present

## 2016-04-19 DIAGNOSIS — N186 End stage renal disease: Secondary | ICD-10-CM | POA: Diagnosis not present

## 2016-04-19 DIAGNOSIS — D509 Iron deficiency anemia, unspecified: Secondary | ICD-10-CM | POA: Diagnosis not present

## 2016-04-19 DIAGNOSIS — N2581 Secondary hyperparathyroidism of renal origin: Secondary | ICD-10-CM | POA: Diagnosis not present

## 2016-04-19 DIAGNOSIS — D631 Anemia in chronic kidney disease: Secondary | ICD-10-CM | POA: Diagnosis not present

## 2016-04-22 DIAGNOSIS — N2581 Secondary hyperparathyroidism of renal origin: Secondary | ICD-10-CM | POA: Diagnosis not present

## 2016-04-22 DIAGNOSIS — D509 Iron deficiency anemia, unspecified: Secondary | ICD-10-CM | POA: Diagnosis not present

## 2016-04-22 DIAGNOSIS — N186 End stage renal disease: Secondary | ICD-10-CM | POA: Diagnosis not present

## 2016-04-22 DIAGNOSIS — D631 Anemia in chronic kidney disease: Secondary | ICD-10-CM | POA: Diagnosis not present

## 2016-04-24 DIAGNOSIS — D631 Anemia in chronic kidney disease: Secondary | ICD-10-CM | POA: Diagnosis not present

## 2016-04-24 DIAGNOSIS — I8291 Chronic embolism and thrombosis of unspecified vein: Secondary | ICD-10-CM | POA: Diagnosis not present

## 2016-04-24 DIAGNOSIS — D509 Iron deficiency anemia, unspecified: Secondary | ICD-10-CM | POA: Diagnosis not present

## 2016-04-24 DIAGNOSIS — N186 End stage renal disease: Secondary | ICD-10-CM | POA: Diagnosis not present

## 2016-04-24 DIAGNOSIS — N2581 Secondary hyperparathyroidism of renal origin: Secondary | ICD-10-CM | POA: Diagnosis not present

## 2016-04-26 ENCOUNTER — Telehealth: Payer: Self-pay

## 2016-04-26 DIAGNOSIS — N186 End stage renal disease: Secondary | ICD-10-CM | POA: Diagnosis not present

## 2016-04-26 DIAGNOSIS — N2581 Secondary hyperparathyroidism of renal origin: Secondary | ICD-10-CM | POA: Diagnosis not present

## 2016-04-26 DIAGNOSIS — D631 Anemia in chronic kidney disease: Secondary | ICD-10-CM | POA: Diagnosis not present

## 2016-04-26 DIAGNOSIS — D509 Iron deficiency anemia, unspecified: Secondary | ICD-10-CM | POA: Diagnosis not present

## 2016-04-26 NOTE — Telephone Encounter (Signed)
I have spoken with Dr. Elie Confer regarding INR result from 5/10 done at dialysis, result of 4.67. Based on current dose and goal INR of 2-3 he recommends decreasing dose by approx. 10% to 52.5mg  weekly.   Patients new daily dose 1.5 tabs or 7.5mg .    Spoke with Dr. Lynnae January and Dr. Marijean Bravo to confirm plan.    Unfortunatley, unable to get in touch with patient after contacting his home number, grandmother, and an additional number provided by grandmother.  Unable to provide new dosing instructions to patient at this time.  Dr. Marijean Bravo contacted dialysis center, they report nothing abnormal about today's hemo session, only draw weekly INR on wednesday.  No additional help from them.   I have provided the emergency contact (grandmother) the number to reach the oncall resident should she be able to reach the patient.

## 2016-04-29 ENCOUNTER — Telehealth: Payer: Self-pay | Admitting: Pharmacist

## 2016-04-29 DIAGNOSIS — N2581 Secondary hyperparathyroidism of renal origin: Secondary | ICD-10-CM | POA: Diagnosis not present

## 2016-04-29 DIAGNOSIS — D631 Anemia in chronic kidney disease: Secondary | ICD-10-CM | POA: Diagnosis not present

## 2016-04-29 DIAGNOSIS — N186 End stage renal disease: Secondary | ICD-10-CM | POA: Diagnosis not present

## 2016-04-29 DIAGNOSIS — D509 Iron deficiency anemia, unspecified: Secondary | ICD-10-CM | POA: Diagnosis not present

## 2016-04-29 NOTE — Telephone Encounter (Signed)
Left message on answering machine:  INR from last week at HD center 4.67 on 62.5mg /wk warfarin. Decrease dose to 7.5mg  (using 5mg  warfarin tablets; take 1&1/2 x 5mg  = 7.5mg ) daily. Next INR for Wednesday/Friday of this week at HD Center.

## 2016-04-29 NOTE — Telephone Encounter (Signed)
Thank you Dr. Elie Confer... I agree with your plan as outlined

## 2016-05-01 ENCOUNTER — Telehealth: Payer: Self-pay | Admitting: *Deleted

## 2016-05-01 DIAGNOSIS — D631 Anemia in chronic kidney disease: Secondary | ICD-10-CM | POA: Diagnosis not present

## 2016-05-01 DIAGNOSIS — N2581 Secondary hyperparathyroidism of renal origin: Secondary | ICD-10-CM | POA: Diagnosis not present

## 2016-05-01 DIAGNOSIS — N186 End stage renal disease: Secondary | ICD-10-CM | POA: Diagnosis not present

## 2016-05-01 DIAGNOSIS — D509 Iron deficiency anemia, unspecified: Secondary | ICD-10-CM | POA: Diagnosis not present

## 2016-05-01 NOTE — Telephone Encounter (Signed)
Received PA request from pt's pharmacy.  Request submitted online via Cover My Meds-request sent for review     Cover My Meds: Name:Hoffmann Key: [id number] DOB: [birth year]

## 2016-05-02 ENCOUNTER — Ambulatory Visit (INDEPENDENT_AMBULATORY_CARE_PROVIDER_SITE_OTHER): Payer: Medicare Other | Admitting: Pharmacist

## 2016-05-02 DIAGNOSIS — Z86718 Personal history of other venous thrombosis and embolism: Secondary | ICD-10-CM | POA: Diagnosis present

## 2016-05-02 DIAGNOSIS — Z86711 Personal history of pulmonary embolism: Secondary | ICD-10-CM | POA: Diagnosis not present

## 2016-05-02 DIAGNOSIS — I824Y9 Acute embolism and thrombosis of unspecified deep veins of unspecified proximal lower extremity: Secondary | ICD-10-CM

## 2016-05-02 DIAGNOSIS — Z7901 Long term (current) use of anticoagulants: Secondary | ICD-10-CM | POA: Diagnosis not present

## 2016-05-02 LAB — POCT INR: INR: 1.8

## 2016-05-02 MED FILL — NARCAN 4 MG NASAL SPRAY: 4 | 15 days supply | Qty: 2 | Fill #0

## 2016-05-02 NOTE — Progress Notes (Signed)
Anticoagulation Management Brian [name] is a 40 y.o. male who reports to the clinic for monitoring of warfarin treatment.    Indication: Hx DVT/PE Duration: indefinite  Anticoagulation Clinic Visit History: Patient does not report signs/symptoms of bleeding or thromboembolism. Patient with reported INR >4 at previous HD session. Dose at that time was 10mg  and 7.5mg  alternating days. Dose was subsequently decreased to 7.5mg  daily. Pt states he has previously missed 2 doses this week and has not taken his dose yet today.   Anticoagulation Episode Summary    Current INR goal 2.0-3.0  Next INR check 04/11/2016  INR from last check 2.1 (03/21/2016)  Weekly max dose   Target end date Indefinite  INR check location   Preferred lab   Send INR reminders to    Indications  Deep vein thrombosis (HCC) (Resolved) [I82.409] History of deep vein thrombosis (Resolved) [Z86.718] PULMONARY EMBOLISM (Resolved) [I26.99] PULMONARY EMBOLISM (Resolved) [I26.99]        Comments        ASSESSMENT Recent Results: Lab Results  Component Value Date   INR 2.1 03/21/2016   INR 2.3 01/11/2016   INR 2.7 12/28/2015    Anticoagulation Dosing: INR as of 03/21/2016 and Previous Dosing Information    INR Dt INR Goal [employer] Sun Mon Tue Wed Thu Fri Sat   03/21/2016 2.1 2.0-3.0 62.5 mg 10 mg 10 mg 7.5 mg 10 mg 7.5 mg 10 mg 7.5 mg    Anticoagulation Dose Instructions as of 03/21/2016      Total Sun Mon Tue Wed Thu Fri Sat   New Dose 62.5 mg 10 mg 10 mg 7.5 mg 10 mg 7.5 mg 10 mg 7.5 mg     (5 mg x 2)  (5 mg x 2)  (5 mg x 1.5)  (5 mg x 2)  (5 mg x 1.5)  (5 mg x 2)  (5 mg x 1.5)                           INR today: 1.8. (goal 2-3)  PLAN Weekly dose will be staying the same (7.5mg  daily = 52.5mg /week) but patient will take 10mg  x1 tonight instead of 7.5mg . Starting tomorrow he will continue the 7.5mg  daily dose and follow-up in 2 weeks.   There are no Patient Instructions on file for this  visit. Patient advised to contact clinic or seek medical attention if signs/symptoms of bleeding or thromboembolism occur.  Patient verbalized understanding by repeating back information and was advised to contact me if further medication-related questions arise. Patient was also provided an information handout.  Follow-up Patient will follow-up in the clinic on May 30th @2pm .    Raydel Hosick C. Lennox Grumbles, PharmD Pharmacy Resident  Pager: [phone] 05/02/2016 2:51 PM

## 2016-05-02 NOTE — Patient Instructions (Signed)
Patient educated about medication as defined in this encounter and verbalized understanding by repeating back instructions provided.   

## 2016-05-02 NOTE — Progress Notes (Signed)
Patient was seen in clinic by Tamala Julian, PharmD, PGY1 pharmacy resident with Marylou Flesher, PharmD Candidate. I agree with the assessment and plan of care documented.

## 2016-05-03 DIAGNOSIS — N186 End stage renal disease: Secondary | ICD-10-CM | POA: Diagnosis not present

## 2016-05-03 DIAGNOSIS — N2581 Secondary hyperparathyroidism of renal origin: Secondary | ICD-10-CM | POA: Diagnosis not present

## 2016-05-03 DIAGNOSIS — D631 Anemia in chronic kidney disease: Secondary | ICD-10-CM | POA: Diagnosis not present

## 2016-05-03 DIAGNOSIS — D509 Iron deficiency anemia, unspecified: Secondary | ICD-10-CM | POA: Diagnosis not present

## 2016-05-03 NOTE — Progress Notes (Signed)
Reviewed Thanks DrG 

## 2016-05-06 DIAGNOSIS — N186 End stage renal disease: Secondary | ICD-10-CM | POA: Diagnosis not present

## 2016-05-06 DIAGNOSIS — D509 Iron deficiency anemia, unspecified: Secondary | ICD-10-CM | POA: Diagnosis not present

## 2016-05-06 DIAGNOSIS — N2581 Secondary hyperparathyroidism of renal origin: Secondary | ICD-10-CM | POA: Diagnosis not present

## 2016-05-06 DIAGNOSIS — D631 Anemia in chronic kidney disease: Secondary | ICD-10-CM | POA: Diagnosis not present

## 2016-05-07 ENCOUNTER — Other Ambulatory Visit: Payer: Self-pay | Admitting: Internal Medicine

## 2016-05-07 ENCOUNTER — Other Ambulatory Visit: Payer: Self-pay | Admitting: Cardiology

## 2016-05-07 DIAGNOSIS — J302 Other seasonal allergic rhinitis: Secondary | ICD-10-CM

## 2016-05-07 DIAGNOSIS — Z7901 Long term (current) use of anticoagulants: Secondary | ICD-10-CM

## 2016-05-07 NOTE — Telephone Encounter (Signed)
Approved through 12/15/2016.Despina Hidden Cassady5/23/201711:38 AM

## 2016-05-07 NOTE — Telephone Encounter (Signed)
Rx refill sent to pharmacy. 

## 2016-05-08 ENCOUNTER — Telehealth: Payer: Self-pay | Admitting: Internal Medicine

## 2016-05-08 DIAGNOSIS — N2581 Secondary hyperparathyroidism of renal origin: Secondary | ICD-10-CM | POA: Diagnosis not present

## 2016-05-08 DIAGNOSIS — D509 Iron deficiency anemia, unspecified: Secondary | ICD-10-CM | POA: Diagnosis not present

## 2016-05-08 DIAGNOSIS — N186 End stage renal disease: Secondary | ICD-10-CM | POA: Diagnosis not present

## 2016-05-08 DIAGNOSIS — D631 Anemia in chronic kidney disease: Secondary | ICD-10-CM | POA: Diagnosis not present

## 2016-05-08 NOTE — Telephone Encounter (Signed)
APT. REMINDER CALL, LMTCB °

## 2016-05-09 ENCOUNTER — Encounter: Payer: Self-pay | Admitting: Internal Medicine

## 2016-05-09 ENCOUNTER — Ambulatory Visit (INDEPENDENT_AMBULATORY_CARE_PROVIDER_SITE_OTHER): Payer: Medicare Other | Admitting: Internal Medicine

## 2016-05-09 VITALS — BP 119/87 | HR 112 | Temp 97.7°F | Wt 187.1 lb

## 2016-05-09 DIAGNOSIS — J302 Other seasonal allergic rhinitis: Secondary | ICD-10-CM

## 2016-05-09 DIAGNOSIS — R630 Anorexia: Secondary | ICD-10-CM

## 2016-05-09 DIAGNOSIS — Z6824 Body mass index (BMI) 24.0-24.9, adult: Secondary | ICD-10-CM | POA: Diagnosis not present

## 2016-05-09 DIAGNOSIS — Q613 Polycystic kidney, unspecified: Secondary | ICD-10-CM

## 2016-05-09 DIAGNOSIS — Z9109 Other allergy status, other than to drugs and biological substances: Secondary | ICD-10-CM

## 2016-05-09 MED ORDER — DRONABINOL 5 MG PO CAPS: 5.0000 mg | ORAL_CAPSULE | Freq: Two times a day (BID) | ORAL | Status: DC

## 2016-05-09 MED ORDER — FLUTICASONE PROPIONATE 50 MCG/ACT NA SUSP: NASAL | Status: DC

## 2016-05-09 MED ORDER — OXYCODONE-ACETAMINOPHEN 10-325 MG PO TABS: 1.0000 | ORAL_TABLET | ORAL | Status: DC | PRN

## 2016-05-09 NOTE — Assessment & Plan Note (Signed)
A: Patient will refill today from prescription I gave him last month. I will provide him a new prescription with earliest fill of June 24 for a 30 day supply. His chronic opioid therapy improves functionality.  P: Oxycodone 10 mg q6h prn

## 2016-05-09 NOTE — Patient Instructions (Signed)
Mr. Haris,  It was a privilege taking care of your.  I have given you refills for the next month.  Please regularly follow up in the Coumadin clinic.  Please follow up in two months to meet your new doctor.

## 2016-05-09 NOTE — Assessment & Plan Note (Signed)
A: patient complaining of seasonal allergies a couple weeks ago.  P: Fluticasone nasal spray

## 2016-05-09 NOTE — Progress Notes (Signed)
   Subjective:    Patient ID: Brian [name], male    DOB: [birth year], 40 y.o.   MRN: [id number]  HPI  Brian [name] is a 40 year old man with a PMH of PCKD, chronic pain on chronic opioids, HIV, and tobacco abuse who comes to the clinic to discuss his appetite loss, pain from PKD, and environmental allergies.  Appetite Loss: Patient has been adherent with dronabinol BID. He feels it significantly improves his appetite and has noticed that he's had more "meat on his bones." He has tolerated the medication well.  Polycystic Kidney Disease & Chronic pain: Patient says that severe abdominal pain that he occasionally feels is significantly improved with oxycodone. He feels it enables him to lead a more fulfilling, active life.  Environmental Allergies: While not currently symptomatic, patient reports some runny nose, itchy eyes, and sneezing a couple weeks ago.   Review of Systems  Constitutional: Positive for appetite change.  HENT: Negative for rhinorrhea and sore throat.   Eyes: Negative for redness and itching.  Respiratory: Negative for cough and shortness of breath.   Genitourinary: Negative for discharge.  Neurological: Negative for dizziness and headaches.       Objective:   Physical Exam  HENT:  Mouth/Throat: Oropharynx is clear and moist. No oropharyngeal exudate.  Eyes: Conjunctivae are normal. No scleral icterus.  Pulmonary/Chest: Effort normal and breath sounds normal. No respiratory distress. He has no wheezes.  Abdominal: Soft. Bowel sounds are normal. He exhibits no distension. There is no tenderness.  Neurological: He is alert.  Psychiatric: He has a normal mood and affect. His behavior is normal.          Assessment & Plan:   Please see problem based assessment and plan for details.

## 2016-05-09 NOTE — Assessment & Plan Note (Signed)
A: patient appears to be responding well to dronabinol. It is improving his quality of life and improving his appetite.  P: Dronabinol 5 mg BID

## 2016-05-10 DIAGNOSIS — D509 Iron deficiency anemia, unspecified: Secondary | ICD-10-CM | POA: Diagnosis not present

## 2016-05-10 DIAGNOSIS — N186 End stage renal disease: Secondary | ICD-10-CM | POA: Diagnosis not present

## 2016-05-10 DIAGNOSIS — D631 Anemia in chronic kidney disease: Secondary | ICD-10-CM | POA: Diagnosis not present

## 2016-05-10 DIAGNOSIS — N2581 Secondary hyperparathyroidism of renal origin: Secondary | ICD-10-CM | POA: Diagnosis not present

## 2016-05-10 NOTE — Progress Notes (Signed)
Internal Medicine Clinic Attending  Case discussed with Dr. Ford at the time of the visit.  We reviewed the resident's history and exam and pertinent patient test results.  I agree with the assessment, diagnosis, and plan of care documented in the resident's note.  

## 2016-05-13 DIAGNOSIS — N186 End stage renal disease: Secondary | ICD-10-CM | POA: Diagnosis not present

## 2016-05-13 DIAGNOSIS — D631 Anemia in chronic kidney disease: Secondary | ICD-10-CM | POA: Diagnosis not present

## 2016-05-13 DIAGNOSIS — D509 Iron deficiency anemia, unspecified: Secondary | ICD-10-CM | POA: Diagnosis not present

## 2016-05-13 DIAGNOSIS — N2581 Secondary hyperparathyroidism of renal origin: Secondary | ICD-10-CM | POA: Diagnosis not present

## 2016-05-14 ENCOUNTER — Ambulatory Visit: Payer: Self-pay | Admitting: Pharmacist

## 2016-05-15 DIAGNOSIS — Z992 Dependence on renal dialysis: Secondary | ICD-10-CM | POA: Diagnosis not present

## 2016-05-15 DIAGNOSIS — D631 Anemia in chronic kidney disease: Secondary | ICD-10-CM | POA: Diagnosis not present

## 2016-05-15 DIAGNOSIS — N2581 Secondary hyperparathyroidism of renal origin: Secondary | ICD-10-CM | POA: Diagnosis not present

## 2016-05-15 DIAGNOSIS — N186 End stage renal disease: Secondary | ICD-10-CM | POA: Diagnosis not present

## 2016-05-15 DIAGNOSIS — I120 Hypertensive chronic kidney disease with stage 5 chronic kidney disease or end stage renal disease: Secondary | ICD-10-CM | POA: Diagnosis not present

## 2016-05-15 DIAGNOSIS — D509 Iron deficiency anemia, unspecified: Secondary | ICD-10-CM | POA: Diagnosis not present

## 2016-05-16 ENCOUNTER — Ambulatory Visit (INDEPENDENT_AMBULATORY_CARE_PROVIDER_SITE_OTHER): Payer: Medicare Other | Admitting: Pharmacist

## 2016-05-16 DIAGNOSIS — Z7901 Long term (current) use of anticoagulants: Secondary | ICD-10-CM | POA: Diagnosis not present

## 2016-05-16 DIAGNOSIS — Z86711 Personal history of pulmonary embolism: Secondary | ICD-10-CM | POA: Diagnosis not present

## 2016-05-16 DIAGNOSIS — Z86718 Personal history of other venous thrombosis and embolism: Secondary | ICD-10-CM

## 2016-05-16 LAB — POCT INR: INR: 2.3

## 2016-05-16 NOTE — Progress Notes (Signed)
Anticoagulation Management Brian [name] is a 40 y.o. male who reports to the clinic for monitoring of warfarin treatment.    Indication: DVT and PE Duration: indefinite  Anticoagulation Clinic Visit History: Patient does report signs/symptoms of bleeding. Reports increased bleeding with HD the past couple of sessions.    Anticoagulation Episode Summary    Current INR goal 2.0-3.0  Next INR check 05/30/2016  INR from last check 2.3 (05/16/2016)  Weekly max dose   Target end date Indefinite  INR check location   Preferred lab   Send INR reminders to    Indications  Deep vein thrombosis (HCC) (Resolved) [I82.409] History of deep vein thrombosis (Resolved) [Z86.718] PULMONARY EMBOLISM (Resolved) [I26.99] PULMONARY EMBOLISM (Resolved) [I26.99]        Comments        ASSESSMENT Recent Results: The most recent result is correlated with 45mg  per week. Pt should have been on 52.5mg /wk but missed one dose yesterday: Lab Results  Component Value Date   INR 2.3 05/16/2016   INR 1.8 05/02/2016   INR 2.1 03/21/2016    Anticoagulation Dosing: INR as of 05/16/2016 and Previous Dosing Information    INR Dt INR Goal [name] Sun Mon Tue Wed Thu Fri Sat   05/16/2016 2.3 2.0-3.0 45 mg 7.5 mg 7.5 mg 7.5 mg 0 mg 7.5 mg 7.5 mg 7.5 mg   Patient deviated from recommended dosing.       Anticoagulation Dose Instructions as of 05/16/2016      Total Sun Mon Tue Wed Thu Fri Sat   New Dose 47.5 mg 7.5 mg 5 mg 7.5 mg 7.5 mg 7.5 mg 5 mg 7.5 mg     (5 mg x 1.5)  (5 mg x 1)  (5 mg x 1.5)  (5 mg x 1.5)  (5 mg x 1.5)  (5 mg x 1)  (5 mg x 1.5)                           INR today: Therapeutic  PLAN Weekly dose was increased by 5% to 47.5mg  per week. Was increased due to patient being sub-therapeutic in the past while on 45 mg/week in the past. Patient also has a history of non-compliance.   Patient Instructions  Patient educated about medication as defined in this encounter and verbalized  understanding by repeating back instructions provided.   Patient advised to contact clinic or seek medical attention if signs/symptoms of bleeding or thromboembolism occur.  Patient verbalized understanding by repeating back information and was advised to contact me if further medication-related questions arise. Patient was also provided an information handout.  Follow-up Return in about 2 weeks (around 05/30/2016).  [name] L [name]  15 minutes spent face-to-face with the patient during the encounter. 50% of time spent on education. 50% of time was spent on assessment and plan.

## 2016-05-16 NOTE — Progress Notes (Signed)
Patient was seen in clinic with Karlyn Agee, PharmD candidate. I agree with the assessment and plan of care documented.

## 2016-05-16 NOTE — Patient Instructions (Signed)
Patient educated about medication as defined in this encounter and verbalized understanding by repeating back instructions provided.   

## 2016-05-17 DIAGNOSIS — D509 Iron deficiency anemia, unspecified: Secondary | ICD-10-CM | POA: Diagnosis not present

## 2016-05-17 DIAGNOSIS — E8779 Other fluid overload: Secondary | ICD-10-CM | POA: Diagnosis not present

## 2016-05-17 DIAGNOSIS — N186 End stage renal disease: Secondary | ICD-10-CM | POA: Diagnosis not present

## 2016-05-17 DIAGNOSIS — N2581 Secondary hyperparathyroidism of renal origin: Secondary | ICD-10-CM | POA: Diagnosis not present

## 2016-05-17 NOTE — Progress Notes (Signed)
INTERNAL MEDICINE TEACHING ATTENDING ADDENDUM - Lalla Brothers M.D  Duration- indefinite, Indication- unprovoked VTE, INR- therapeutic. Agree with pharmacy recommendations as outlined in their note.

## 2016-05-20 DIAGNOSIS — N186 End stage renal disease: Secondary | ICD-10-CM | POA: Diagnosis not present

## 2016-05-20 DIAGNOSIS — N2581 Secondary hyperparathyroidism of renal origin: Secondary | ICD-10-CM | POA: Diagnosis not present

## 2016-05-20 DIAGNOSIS — E8779 Other fluid overload: Secondary | ICD-10-CM | POA: Diagnosis not present

## 2016-05-20 DIAGNOSIS — D509 Iron deficiency anemia, unspecified: Secondary | ICD-10-CM | POA: Diagnosis not present

## 2016-05-22 DIAGNOSIS — N186 End stage renal disease: Secondary | ICD-10-CM | POA: Diagnosis not present

## 2016-05-22 DIAGNOSIS — N2581 Secondary hyperparathyroidism of renal origin: Secondary | ICD-10-CM | POA: Diagnosis not present

## 2016-05-22 DIAGNOSIS — E8779 Other fluid overload: Secondary | ICD-10-CM | POA: Diagnosis not present

## 2016-05-22 DIAGNOSIS — D509 Iron deficiency anemia, unspecified: Secondary | ICD-10-CM | POA: Diagnosis not present

## 2016-05-24 DIAGNOSIS — N2581 Secondary hyperparathyroidism of renal origin: Secondary | ICD-10-CM | POA: Diagnosis not present

## 2016-05-24 DIAGNOSIS — N186 End stage renal disease: Secondary | ICD-10-CM | POA: Diagnosis not present

## 2016-05-24 DIAGNOSIS — E8779 Other fluid overload: Secondary | ICD-10-CM | POA: Diagnosis not present

## 2016-05-24 DIAGNOSIS — D509 Iron deficiency anemia, unspecified: Secondary | ICD-10-CM | POA: Diagnosis not present

## 2016-05-27 DIAGNOSIS — E8779 Other fluid overload: Secondary | ICD-10-CM | POA: Diagnosis not present

## 2016-05-27 DIAGNOSIS — N2581 Secondary hyperparathyroidism of renal origin: Secondary | ICD-10-CM | POA: Diagnosis not present

## 2016-05-27 DIAGNOSIS — D509 Iron deficiency anemia, unspecified: Secondary | ICD-10-CM | POA: Diagnosis not present

## 2016-05-27 DIAGNOSIS — N186 End stage renal disease: Secondary | ICD-10-CM | POA: Diagnosis not present

## 2016-05-28 ENCOUNTER — Other Ambulatory Visit: Payer: Medicare Other

## 2016-05-28 DIAGNOSIS — Z113 Encounter for screening for infections with a predominantly sexual mode of transmission: Secondary | ICD-10-CM

## 2016-05-28 DIAGNOSIS — N2581 Secondary hyperparathyroidism of renal origin: Secondary | ICD-10-CM | POA: Diagnosis not present

## 2016-05-28 DIAGNOSIS — D509 Iron deficiency anemia, unspecified: Secondary | ICD-10-CM | POA: Diagnosis not present

## 2016-05-28 DIAGNOSIS — E8779 Other fluid overload: Secondary | ICD-10-CM | POA: Diagnosis not present

## 2016-05-28 DIAGNOSIS — B20 Human immunodeficiency virus [HIV] disease: Secondary | ICD-10-CM

## 2016-05-28 DIAGNOSIS — N186 End stage renal disease: Secondary | ICD-10-CM | POA: Diagnosis not present

## 2016-05-29 DIAGNOSIS — N2581 Secondary hyperparathyroidism of renal origin: Secondary | ICD-10-CM | POA: Diagnosis not present

## 2016-05-29 DIAGNOSIS — E8779 Other fluid overload: Secondary | ICD-10-CM | POA: Diagnosis not present

## 2016-05-29 DIAGNOSIS — D509 Iron deficiency anemia, unspecified: Secondary | ICD-10-CM | POA: Diagnosis not present

## 2016-05-29 DIAGNOSIS — N186 End stage renal disease: Secondary | ICD-10-CM | POA: Diagnosis not present

## 2016-05-29 DIAGNOSIS — I8291 Chronic embolism and thrombosis of unspecified vein: Secondary | ICD-10-CM | POA: Diagnosis not present

## 2016-05-29 LAB — HIV-1 RNA QUANT-NO REFLEX-BLD
HIV 1 RNA Quant: 23 copies/mL — ABNORMAL HIGH (ref <20)
HIV-1 RNA Quant, Log: 1.36 Log copies/mL — ABNORMAL HIGH (ref <1.30)

## 2016-05-29 LAB — RPR

## 2016-05-29 LAB — T-HELPER CELL (CD4) - (RCID CLINIC ONLY)
CD4 % Helper T Cell: 48 % (ref 33–55)
CD4 T CELL ABS: 750 /uL (ref 400–2700)

## 2016-05-30 ENCOUNTER — Ambulatory Visit (INDEPENDENT_AMBULATORY_CARE_PROVIDER_SITE_OTHER): Payer: Medicare Other | Admitting: Pharmacist

## 2016-05-30 DIAGNOSIS — Z7901 Long term (current) use of anticoagulants: Secondary | ICD-10-CM | POA: Diagnosis not present

## 2016-05-30 DIAGNOSIS — I82729 Chronic embolism and thrombosis of deep veins of unspecified upper extremity: Secondary | ICD-10-CM

## 2016-05-30 LAB — POCT INR: INR: 1.7

## 2016-05-30 NOTE — Patient Instructions (Signed)
Patient educated about medication as defined in this encounter and verbalized understanding by repeating back instructions provided.   

## 2016-05-30 NOTE — Progress Notes (Signed)
Anticoagulation Management Brian [name] is a 40 y.o. male who reports to the clinic for monitoring of warfarin treatment.    Indication: DVT, PE and history Duration: indefinite  Anticoagulation Clinic Visit History: Patient does not report signs/symptoms of bleeding or thromboembolism   Anticoagulation Episode Summary    Current INR goal 2.0-3.0  Next INR check 06/13/2016  INR from last check 1.7! (05/30/2016)  Weekly max dose   Target end date Indefinite  INR check location   Preferred lab   Send INR reminders to    Indications  Deep vein thrombosis (HCC) (Resolved) [I82.409] History of deep vein thrombosis (Resolved) [Z86.718] PULMONARY EMBOLISM (Resolved) [I26.99] PULMONARY EMBOLISM (Resolved) [I26.99]        Comments        ASSESSMENT Recent Results: The most recent result is correlated with 47.5 mg per week, however unclear due to adherence challenges Lab Results  Component Value Date   INR 1.7 05/30/2016   INR 2.3 05/16/2016   INR 1.8 05/02/2016   Anticoagulation Dosing: INR as of 05/30/2016 and Previous Dosing Information    INR Dt INR Goal [facility] Sun Mon Tue Wed Thu Fri Sat   05/30/2016 1.7 2.0-3.0 47.5 mg 7.5 mg 5 mg 7.5 mg 7.5 mg 7.5 mg 5 mg 7.5 mg   Patient deviated from recommended dosing.       Anticoagulation Dose Instructions as of 05/30/2016      Total Sun Mon Tue Wed Thu Fri Sat   New Dose 50 mg 7.5 mg 5 mg 7.5 mg 7.5 mg 7.5 mg 7.5 mg 7.5 mg     (5 mg x 1.5)  (5 mg x 1)  (5 mg x 1.5)  (5 mg x 1.5)  (5 mg x 1.5)  (5 mg x 1.5)  (5 mg x 1.5)                           INR today: Subtherapeutic  PLAN Weekly dose was increased by 5% to 50 mg per week  Patient Instructions  Patient educated about medication as defined in this encounter and verbalized understanding by repeating back instructions provided.   Patient advised to contact clinic or seek medical attention if signs/symptoms of bleeding or thromboembolism occur.  Patient  verbalized understanding by repeating back information and was advised to contact me if further medication-related questions arise. Patient was also provided an information handout.  Follow-up No Follow-up on file.  Brian [name]  30 minutes spent face-to-face with the patient during the encounter. 50% of time spent on education. 50% of time was spent on assessment and plan.

## 2016-05-31 DIAGNOSIS — E8779 Other fluid overload: Secondary | ICD-10-CM | POA: Diagnosis not present

## 2016-05-31 DIAGNOSIS — D509 Iron deficiency anemia, unspecified: Secondary | ICD-10-CM | POA: Diagnosis not present

## 2016-05-31 DIAGNOSIS — N2581 Secondary hyperparathyroidism of renal origin: Secondary | ICD-10-CM | POA: Diagnosis not present

## 2016-05-31 DIAGNOSIS — N186 End stage renal disease: Secondary | ICD-10-CM | POA: Diagnosis not present

## 2016-05-31 NOTE — Progress Notes (Signed)
INTERNAL MEDICINE TEACHING ATTENDING ADDENDUM - Lalla Brothers M.D  Duration- indefinite, Indication- unprovoked VTE, INR- subtherapeutic. Agree with pharmacy recommendations as outlined in their note.

## 2016-06-01 ENCOUNTER — Other Ambulatory Visit: Payer: Self-pay | Admitting: Internal Medicine

## 2016-06-03 DIAGNOSIS — E8779 Other fluid overload: Secondary | ICD-10-CM | POA: Diagnosis not present

## 2016-06-03 DIAGNOSIS — D509 Iron deficiency anemia, unspecified: Secondary | ICD-10-CM | POA: Diagnosis not present

## 2016-06-03 DIAGNOSIS — N186 End stage renal disease: Secondary | ICD-10-CM | POA: Diagnosis not present

## 2016-06-03 DIAGNOSIS — N2581 Secondary hyperparathyroidism of renal origin: Secondary | ICD-10-CM | POA: Diagnosis not present

## 2016-06-05 DIAGNOSIS — N2581 Secondary hyperparathyroidism of renal origin: Secondary | ICD-10-CM | POA: Diagnosis not present

## 2016-06-05 DIAGNOSIS — N186 End stage renal disease: Secondary | ICD-10-CM | POA: Diagnosis not present

## 2016-06-05 DIAGNOSIS — E8779 Other fluid overload: Secondary | ICD-10-CM | POA: Diagnosis not present

## 2016-06-05 DIAGNOSIS — D509 Iron deficiency anemia, unspecified: Secondary | ICD-10-CM | POA: Diagnosis not present

## 2016-06-07 DIAGNOSIS — E8779 Other fluid overload: Secondary | ICD-10-CM | POA: Diagnosis not present

## 2016-06-07 DIAGNOSIS — N2581 Secondary hyperparathyroidism of renal origin: Secondary | ICD-10-CM | POA: Diagnosis not present

## 2016-06-07 DIAGNOSIS — N186 End stage renal disease: Secondary | ICD-10-CM | POA: Diagnosis not present

## 2016-06-07 DIAGNOSIS — D509 Iron deficiency anemia, unspecified: Secondary | ICD-10-CM | POA: Diagnosis not present

## 2016-06-10 DIAGNOSIS — N2581 Secondary hyperparathyroidism of renal origin: Secondary | ICD-10-CM | POA: Diagnosis not present

## 2016-06-10 DIAGNOSIS — D509 Iron deficiency anemia, unspecified: Secondary | ICD-10-CM | POA: Diagnosis not present

## 2016-06-10 DIAGNOSIS — N186 End stage renal disease: Secondary | ICD-10-CM | POA: Diagnosis not present

## 2016-06-10 DIAGNOSIS — E8779 Other fluid overload: Secondary | ICD-10-CM | POA: Diagnosis not present

## 2016-06-11 ENCOUNTER — Encounter: Payer: Self-pay | Admitting: *Deleted

## 2016-06-11 ENCOUNTER — Ambulatory Visit (INDEPENDENT_AMBULATORY_CARE_PROVIDER_SITE_OTHER): Payer: Medicare Other | Admitting: Internal Medicine

## 2016-06-11 ENCOUNTER — Encounter: Payer: Self-pay | Admitting: Internal Medicine

## 2016-06-11 VITALS — BP 118/81 | HR 118 | Temp 97.6°F | Wt 182.0 lb

## 2016-06-11 DIAGNOSIS — B20 Human immunodeficiency virus [HIV] disease: Secondary | ICD-10-CM

## 2016-06-11 DIAGNOSIS — B353 Tinea pedis: Secondary | ICD-10-CM

## 2016-06-12 DIAGNOSIS — E8779 Other fluid overload: Secondary | ICD-10-CM | POA: Diagnosis not present

## 2016-06-12 DIAGNOSIS — N186 End stage renal disease: Secondary | ICD-10-CM | POA: Diagnosis not present

## 2016-06-12 DIAGNOSIS — N2581 Secondary hyperparathyroidism of renal origin: Secondary | ICD-10-CM | POA: Diagnosis not present

## 2016-06-12 DIAGNOSIS — D509 Iron deficiency anemia, unspecified: Secondary | ICD-10-CM | POA: Diagnosis not present

## 2016-06-12 DIAGNOSIS — B353 Tinea pedis: Secondary | ICD-10-CM | POA: Insufficient documentation

## 2016-06-12 NOTE — Progress Notes (Signed)
   Subjective:    Patient ID: Brian [name], male    DOB: 24-Feb-1976, 40 y.o.   MRN: [id number]  HPI Here for follow-up of HIV.  He is on a regimen of lamivudine, Sustiva and abacavir due to his end-stage renal disease. He is on dialysis. Denies any missed doses.   No weight loss, no diarrhea.  CD4 750, viral load with 23 copies. Did miss about 2 times per month since last visit.  Also complaint of both feet with redness and swelling that occurs daily but is not constant.  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  He is concerned about it and notes that he can hear the flow at night.     Review of Systems  Constitutional: Negative for fatigue and unexpected weight change.  Respiratory: Negative for cough and shortness of breath.   Gastrointestinal: Negative for diarrhea.  Skin: Negative for rash.  Neurological: Negative for dizziness and light-headedness.       Objective:   Physical Exam  Constitutional: He appears well-developed and well-nourished. No distress.  HENT:  Mouth/Throat: No oropharyngeal exudate.  Eyes: No scleral icterus.  Cardiovascular: Normal rate, regular rhythm and normal heart sounds.   No murmur heard. Pulmonary/Chest: Effort normal and breath sounds normal. No respiratory distress.  Lymphadenopathy:    He has no cervical adenopathy.  Skin: No rash noted.          Assessment & Plan:

## 2016-06-12 NOTE — Assessment & Plan Note (Signed)
He is doing well despite recent missed doses. He will return in about 4 months to recheck.  I counseled him on maintaining compliance to avoid resistance.

## 2016-06-12 NOTE — Assessment & Plan Note (Signed)
I told him to try anti-fungal cream for his feet and if no improvement we can send him to podiatry.

## 2016-06-13 ENCOUNTER — Ambulatory Visit: Payer: Self-pay | Admitting: Pharmacist

## 2016-06-14 DIAGNOSIS — I120 Hypertensive chronic kidney disease with stage 5 chronic kidney disease or end stage renal disease: Secondary | ICD-10-CM | POA: Diagnosis not present

## 2016-06-14 DIAGNOSIS — Z992 Dependence on renal dialysis: Secondary | ICD-10-CM | POA: Diagnosis not present

## 2016-06-14 DIAGNOSIS — N2581 Secondary hyperparathyroidism of renal origin: Secondary | ICD-10-CM | POA: Diagnosis not present

## 2016-06-14 DIAGNOSIS — D509 Iron deficiency anemia, unspecified: Secondary | ICD-10-CM | POA: Diagnosis not present

## 2016-06-14 DIAGNOSIS — E8779 Other fluid overload: Secondary | ICD-10-CM | POA: Diagnosis not present

## 2016-06-14 DIAGNOSIS — N186 End stage renal disease: Secondary | ICD-10-CM | POA: Diagnosis not present

## 2016-06-17 DIAGNOSIS — D509 Iron deficiency anemia, unspecified: Secondary | ICD-10-CM | POA: Diagnosis not present

## 2016-06-17 DIAGNOSIS — N2581 Secondary hyperparathyroidism of renal origin: Secondary | ICD-10-CM | POA: Diagnosis not present

## 2016-06-17 DIAGNOSIS — N186 End stage renal disease: Secondary | ICD-10-CM | POA: Diagnosis not present

## 2016-06-19 DIAGNOSIS — D509 Iron deficiency anemia, unspecified: Secondary | ICD-10-CM | POA: Diagnosis not present

## 2016-06-19 DIAGNOSIS — N186 End stage renal disease: Secondary | ICD-10-CM | POA: Diagnosis not present

## 2016-06-19 DIAGNOSIS — N2581 Secondary hyperparathyroidism of renal origin: Secondary | ICD-10-CM | POA: Diagnosis not present

## 2016-06-21 DIAGNOSIS — D509 Iron deficiency anemia, unspecified: Secondary | ICD-10-CM | POA: Diagnosis not present

## 2016-06-21 DIAGNOSIS — N186 End stage renal disease: Secondary | ICD-10-CM | POA: Diagnosis not present

## 2016-06-21 DIAGNOSIS — N2581 Secondary hyperparathyroidism of renal origin: Secondary | ICD-10-CM | POA: Diagnosis not present

## 2016-06-24 DIAGNOSIS — D509 Iron deficiency anemia, unspecified: Secondary | ICD-10-CM | POA: Diagnosis not present

## 2016-06-24 DIAGNOSIS — N2581 Secondary hyperparathyroidism of renal origin: Secondary | ICD-10-CM | POA: Diagnosis not present

## 2016-06-24 DIAGNOSIS — N186 End stage renal disease: Secondary | ICD-10-CM | POA: Diagnosis not present

## 2016-06-26 DIAGNOSIS — I8291 Chronic embolism and thrombosis of unspecified vein: Secondary | ICD-10-CM | POA: Diagnosis not present

## 2016-06-26 DIAGNOSIS — N186 End stage renal disease: Secondary | ICD-10-CM | POA: Diagnosis not present

## 2016-06-26 DIAGNOSIS — N2581 Secondary hyperparathyroidism of renal origin: Secondary | ICD-10-CM | POA: Diagnosis not present

## 2016-06-26 DIAGNOSIS — D509 Iron deficiency anemia, unspecified: Secondary | ICD-10-CM | POA: Diagnosis not present

## 2016-06-27 ENCOUNTER — Ambulatory Visit: Payer: Self-pay | Admitting: Pharmacist

## 2016-06-28 ENCOUNTER — Other Ambulatory Visit: Payer: Self-pay | Admitting: Cardiology

## 2016-06-28 ENCOUNTER — Other Ambulatory Visit: Payer: Self-pay | Admitting: Internal Medicine

## 2016-06-28 DIAGNOSIS — N186 End stage renal disease: Secondary | ICD-10-CM | POA: Diagnosis not present

## 2016-06-28 DIAGNOSIS — D509 Iron deficiency anemia, unspecified: Secondary | ICD-10-CM | POA: Diagnosis not present

## 2016-06-28 DIAGNOSIS — N2581 Secondary hyperparathyroidism of renal origin: Secondary | ICD-10-CM | POA: Diagnosis not present

## 2016-07-01 DIAGNOSIS — N2581 Secondary hyperparathyroidism of renal origin: Secondary | ICD-10-CM | POA: Diagnosis not present

## 2016-07-01 DIAGNOSIS — D509 Iron deficiency anemia, unspecified: Secondary | ICD-10-CM | POA: Diagnosis not present

## 2016-07-01 DIAGNOSIS — N186 End stage renal disease: Secondary | ICD-10-CM | POA: Diagnosis not present

## 2016-07-05 DIAGNOSIS — D509 Iron deficiency anemia, unspecified: Secondary | ICD-10-CM | POA: Diagnosis not present

## 2016-07-05 DIAGNOSIS — N186 End stage renal disease: Secondary | ICD-10-CM | POA: Diagnosis not present

## 2016-07-05 DIAGNOSIS — N2581 Secondary hyperparathyroidism of renal origin: Secondary | ICD-10-CM | POA: Diagnosis not present

## 2016-07-08 DIAGNOSIS — N2581 Secondary hyperparathyroidism of renal origin: Secondary | ICD-10-CM | POA: Diagnosis not present

## 2016-07-08 DIAGNOSIS — N186 End stage renal disease: Secondary | ICD-10-CM | POA: Diagnosis not present

## 2016-07-08 DIAGNOSIS — D509 Iron deficiency anemia, unspecified: Secondary | ICD-10-CM | POA: Diagnosis not present

## 2016-07-09 ENCOUNTER — Other Ambulatory Visit: Payer: Self-pay

## 2016-07-09 NOTE — Telephone Encounter (Signed)
Requesting pain meds to be filled.  

## 2016-07-10 DIAGNOSIS — N186 End stage renal disease: Secondary | ICD-10-CM | POA: Diagnosis not present

## 2016-07-10 DIAGNOSIS — N2581 Secondary hyperparathyroidism of renal origin: Secondary | ICD-10-CM | POA: Diagnosis not present

## 2016-07-10 DIAGNOSIS — D509 Iron deficiency anemia, unspecified: Secondary | ICD-10-CM | POA: Diagnosis not present

## 2016-07-11 ENCOUNTER — Other Ambulatory Visit: Payer: Self-pay | Admitting: *Deleted

## 2016-07-11 DIAGNOSIS — Q613 Polycystic kidney, unspecified: Secondary | ICD-10-CM

## 2016-07-11 DIAGNOSIS — R630 Anorexia: Secondary | ICD-10-CM

## 2016-07-11 MED ORDER — OXYCODONE-ACETAMINOPHEN 10-325 MG PO TABS: 1.0000 | ORAL_TABLET | ORAL | 0 refills | Status: DC | PRN

## 2016-07-11 NOTE — Telephone Encounter (Signed)
Chronic pain documented as related to polycystic kidneys, on oxycodone from [facility]. Last visit with Dr. Marijean Bravo was 5/24, last oxycodone refill by database was 6/25. Last toxassure was March, negative for oxycodone but questionable because patient is on HD? The lab recommended repeat toxassure from blood sample. I am going to approve one month refill, please arrange for clinic visit within that time. It looks like new PCP is booked with limited spots, so may need to see another resident once.

## 2016-07-11 NOTE — Telephone Encounter (Signed)
Last filled 6/25 Visit last- 5/25 Next visit was to be 7/25 and none scheduled 2 no show w/ dr Maudie Mercury Last uds had questions from dr Marijean Bravo

## 2016-07-12 DIAGNOSIS — N186 End stage renal disease: Secondary | ICD-10-CM | POA: Diagnosis not present

## 2016-07-12 DIAGNOSIS — D509 Iron deficiency anemia, unspecified: Secondary | ICD-10-CM | POA: Diagnosis not present

## 2016-07-12 DIAGNOSIS — N2581 Secondary hyperparathyroidism of renal origin: Secondary | ICD-10-CM | POA: Diagnosis not present

## 2016-07-12 MED ORDER — DRONABINOL 5 MG PO CAPS: 5.0000 mg | ORAL_CAPSULE | Freq: Two times a day (BID) | ORAL | 0 refills | Status: DC

## 2016-07-15 DIAGNOSIS — N2581 Secondary hyperparathyroidism of renal origin: Secondary | ICD-10-CM | POA: Diagnosis not present

## 2016-07-15 DIAGNOSIS — D509 Iron deficiency anemia, unspecified: Secondary | ICD-10-CM | POA: Diagnosis not present

## 2016-07-15 DIAGNOSIS — N186 End stage renal disease: Secondary | ICD-10-CM | POA: Diagnosis not present

## 2016-07-15 DIAGNOSIS — Z992 Dependence on renal dialysis: Secondary | ICD-10-CM | POA: Diagnosis not present

## 2016-07-15 DIAGNOSIS — I120 Hypertensive chronic kidney disease with stage 5 chronic kidney disease or end stage renal disease: Secondary | ICD-10-CM | POA: Diagnosis not present

## 2016-07-16 ENCOUNTER — Ambulatory Visit (INDEPENDENT_AMBULATORY_CARE_PROVIDER_SITE_OTHER): Payer: Medicare Other | Admitting: Internal Medicine

## 2016-07-16 VITALS — BP 116/78 | HR 99 | Temp 98.4°F

## 2016-07-16 DIAGNOSIS — F17210 Nicotine dependence, cigarettes, uncomplicated: Secondary | ICD-10-CM | POA: Diagnosis not present

## 2016-07-16 DIAGNOSIS — Z992 Dependence on renal dialysis: Secondary | ICD-10-CM | POA: Diagnosis not present

## 2016-07-16 DIAGNOSIS — G894 Chronic pain syndrome: Secondary | ICD-10-CM

## 2016-07-16 DIAGNOSIS — R630 Anorexia: Secondary | ICD-10-CM

## 2016-07-16 DIAGNOSIS — L03116 Cellulitis of left lower limb: Secondary | ICD-10-CM | POA: Diagnosis not present

## 2016-07-16 DIAGNOSIS — B9689 Other specified bacterial agents as the cause of diseases classified elsewhere: Secondary | ICD-10-CM

## 2016-07-16 DIAGNOSIS — F1210 Cannabis abuse, uncomplicated: Secondary | ICD-10-CM

## 2016-07-16 DIAGNOSIS — Z79891 Long term (current) use of opiate analgesic: Secondary | ICD-10-CM

## 2016-07-16 DIAGNOSIS — N186 End stage renal disease: Secondary | ICD-10-CM

## 2016-07-16 LAB — GLUCOSE, CAPILLARY: Glucose-Capillary: 101 mg/dL — ABNORMAL HIGH (ref 65–99)

## 2016-07-16 MED ORDER — DRONABINOL 5 MG PO CAPS: 5.0000 mg | ORAL_CAPSULE | Freq: Two times a day (BID) | ORAL | 0 refills | Status: DC

## 2016-07-16 NOTE — Assessment & Plan Note (Signed)
Patient says that since dialysis was started- he had lost his appetite, and he was started on marinol. He says it is helping his appetite and he has gained some weight.  -refilled marinol for 1 more month

## 2016-07-16 NOTE — Assessment & Plan Note (Addendum)
His UDS was inappropriately absent of clonazepam and oxycodone at the last visit. Dian [facility]u blood panel for the drug screen to continue receiving his percoset  Addendum: blood drug screen appropriately positive for oxycodone metabolite. It does also show THC positive. Neg for benzos. He had said he was not taking benzos so appropriate.
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
## 2016-07-16 NOTE — Assessment & Plan Note (Signed)
Patient says that about 2 weeks ago, he hit his left lower leg at the wooden footboard and ever since then he was having pain. He went to dialysis 2 days ago and they thought it was cellulitis. He was started on 10 day course of bactrim 2 days ago and says the redness has improved since then. Exam today is consistent with cellulitis. No drainage present.  Plan -continue current course of bactrim for 8 more days per the dialysis team  -follow up if his cellulitis worsens

## 2016-07-16 NOTE — Patient Instructions (Signed)
Thank you for your visit today Please finish your antibiotics . Please return to clinic if your redness increases or if your symptoms worsen

## 2016-07-17 DIAGNOSIS — N186 End stage renal disease: Secondary | ICD-10-CM | POA: Diagnosis not present

## 2016-07-17 DIAGNOSIS — N2581 Secondary hyperparathyroidism of renal origin: Secondary | ICD-10-CM | POA: Diagnosis not present

## 2016-07-17 DIAGNOSIS — D509 Iron deficiency anemia, unspecified: Secondary | ICD-10-CM | POA: Diagnosis not present

## 2016-07-19 DIAGNOSIS — N186 End stage renal disease: Secondary | ICD-10-CM | POA: Diagnosis not present

## 2016-07-19 DIAGNOSIS — D509 Iron deficiency anemia, unspecified: Secondary | ICD-10-CM | POA: Diagnosis not present

## 2016-07-19 DIAGNOSIS — N2581 Secondary hyperparathyroidism of renal origin: Secondary | ICD-10-CM | POA: Diagnosis not present

## 2016-07-22 DIAGNOSIS — N2581 Secondary hyperparathyroidism of renal origin: Secondary | ICD-10-CM | POA: Diagnosis not present

## 2016-07-22 DIAGNOSIS — D509 Iron deficiency anemia, unspecified: Secondary | ICD-10-CM | POA: Diagnosis not present

## 2016-07-22 DIAGNOSIS — N186 End stage renal disease: Secondary | ICD-10-CM | POA: Diagnosis not present

## 2016-07-24 ENCOUNTER — Telehealth: Payer: Self-pay | Admitting: *Deleted

## 2016-07-24 ENCOUNTER — Other Ambulatory Visit: Payer: Self-pay

## 2016-07-24 ENCOUNTER — Encounter (HOSPITAL_COMMUNITY): Payer: Self-pay

## 2016-07-24 ENCOUNTER — Emergency Department (HOSPITAL_COMMUNITY)
Admission: EM | Admit: 2016-07-24 | Discharge: 2016-07-24 | Disposition: A | Discharge status: Home / Self Care | Payer: Medicare Other | Attending: Emergency Medicine | Admitting: Emergency Medicine

## 2016-07-24 DIAGNOSIS — T82838A Hemorrhage of vascular prosthetic devices, implants and grafts, initial encounter: Secondary | ICD-10-CM | POA: Insufficient documentation

## 2016-07-24 DIAGNOSIS — N186 End stage renal disease: Secondary | ICD-10-CM | POA: Insufficient documentation

## 2016-07-24 DIAGNOSIS — Z992 Dependence on renal dialysis: Secondary | ICD-10-CM | POA: Diagnosis not present

## 2016-07-24 DIAGNOSIS — Z9115 Patient's noncompliance with renal dialysis: Secondary | ICD-10-CM | POA: Diagnosis not present

## 2016-07-24 DIAGNOSIS — Y828 Other medical devices associated with adverse incidents: Secondary | ICD-10-CM | POA: Diagnosis not present

## 2016-07-24 DIAGNOSIS — Z7901 Long term (current) use of anticoagulants: Secondary | ICD-10-CM | POA: Insufficient documentation

## 2016-07-24 DIAGNOSIS — I5022 Chronic systolic (congestive) heart failure: Secondary | ICD-10-CM | POA: Diagnosis not present

## 2016-07-24 DIAGNOSIS — I132 Hypertensive heart and chronic kidney disease with heart failure and with stage 5 chronic kidney disease, or end stage renal disease: Secondary | ICD-10-CM | POA: Diagnosis not present

## 2016-07-24 DIAGNOSIS — E876 Hypokalemia: Secondary | ICD-10-CM | POA: Diagnosis not present

## 2016-07-24 DIAGNOSIS — E875 Hyperkalemia: Secondary | ICD-10-CM | POA: Insufficient documentation

## 2016-07-24 DIAGNOSIS — Z79899 Other long term (current) drug therapy: Secondary | ICD-10-CM | POA: Insufficient documentation

## 2016-07-24 DIAGNOSIS — F17210 Nicotine dependence, cigarettes, uncomplicated: Secondary | ICD-10-CM | POA: Insufficient documentation

## 2016-07-24 LAB — BASIC METABOLIC PANEL
Anion gap: 17 — ABNORMAL HIGH (ref 5–15)
BUN: 73 mg/dL — AB (ref 6–20)
CHLORIDE: 93 mmol/L — AB (ref 101–111)
CO2: 24 mmol/L (ref 22–32)
CREATININE: 9.15 mg/dL — AB (ref 0.61–1.24)
Calcium: 7.3 mg/dL — ABNORMAL LOW (ref 8.9–10.3)
GFR calc non Af Amer: 6 mL/min — ABNORMAL LOW (ref >60)
GFR, EST AFRICAN AMERICAN: 7 mL/min — AB (ref >60)
GLUCOSE: 101 mg/dL — AB (ref 65–99)
Potassium: 6.5 mmol/L (ref 3.5–5.1)
Sodium: 134 mmol/L — ABNORMAL LOW (ref 135–145)

## 2016-07-24 LAB — CBC WITH DIFFERENTIAL/PLATELET
Basophils Absolute: 0 10*3/uL (ref 0.0–0.1)
Basophils Relative: 0 %
EOS ABS: 0.1 10*3/uL (ref 0.0–0.7)
Eosinophils Relative: 2 %
HCT: 40 % (ref 39.0–52.0)
HEMOGLOBIN: 12.7 g/dL — AB (ref 13.0–17.0)
LYMPHS ABS: 1.5 10*3/uL (ref 0.7–4.0)
Lymphocytes Relative: 25 %
MCH: 32.3 pg (ref 26.0–34.0)
MCHC: 31.8 g/dL (ref 30.0–36.0)
MCV: 101.8 fL — ABNORMAL HIGH (ref 78.0–100.0)
MONO ABS: 0.5 10*3/uL (ref 0.1–1.0)
MONOS PCT: 9 %
NEUTROS PCT: 64 %
Neutro Abs: 3.9 10*3/uL (ref 1.7–7.7)
Platelets: 136 10*3/uL — ABNORMAL LOW (ref 150–400)
RBC: 3.93 MIL/uL — ABNORMAL LOW (ref 4.22–5.81)
RDW: 15.8 % — AB (ref 11.5–15.5)
WBC: 6 10*3/uL (ref 4.0–10.5)

## 2016-07-24 LAB — PROTIME-INR
INR: 3.44
PROTHROMBIN TIME: 35.5 s — AB (ref 11.4–15.2)

## 2016-07-24 MED ORDER — INSULIN ASPART 100 UNIT/ML IV SOLN: 5.0000 [IU] | Freq: Once | INTRAVENOUS | Status: DC

## 2016-07-24 MED ORDER — SODIUM BICARBONATE 8.4 % IV SOLN: 50.0000 meq | Freq: Once | INTRAVENOUS | Status: DC

## 2016-07-24 MED ORDER — CALCIUM GLUCONATE 10 % IV SOLN: 1.0000 g | Freq: Once | INTRAVENOUS | Status: DC

## 2016-07-24 MED ORDER — DEXTROSE 50 % IV SOLN: 1.0000 | Freq: Once | INTRAVENOUS | Status: DC

## 2016-07-24 MED ORDER — SODIUM POLYSTYRENE SULFONATE 15 GM/60ML PO SUSP: 30.0000 g | Freq: Once | ORAL | Status: DC

## 2016-07-24 MED ORDER — INSULIN ASPART 100 UNIT/ML IV SOLN: 10.0000 [IU] | Freq: Once | INTRAVENOUS | Status: DC
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
## 2016-07-24 NOTE — Progress Notes (Signed)
Patient was discharged home after Hemodialysis tx with stable VS and not in any acute distress. Pt was picked up by girlfriend and personal belongings were taken home by pt.No bleeding from the HD access site noted.

## 2016-07-24 NOTE — ED Notes (Signed)
Informed bowie, PA of critical lab of potassium 6.5

## 2016-07-24 NOTE — Telephone Encounter (Signed)
[name], patient's girlfriend, calling because pt. Fistula has been bleeding since Monday. "It's not profuse, but every time he takes off the bandage it starts bleeding. And he has cellulitis on his leg and it's not looking good." Denies fever or chills. Patient of Dr. Jimmy Footman. Trenton Gammon to take patient to ED for evaluation and to call Dr. Deterding's office. She agreed.

## 2016-07-24 NOTE — ED Triage Notes (Signed)
Per Pt, Pt is coming from home with complaints of continuous bleeding to his fistula in the right upper arm since Monday. Pt has HX of Dialysis MWF. Also, Pt reports havinf injury to the left leg where he hit the coffee table. Pt reports "When I start getting fluid, it is on this leg, but I don't know if it is the injury or the fluid."

## 2016-07-24 NOTE — Progress Notes (Signed)
Patient ID: Brian [name], male   DOB: Jun 04, 1976, 40 y.o.   MRN: [id number]   ESRD  Pt  [facility] MWF Presents to ER co AVF swelling  And some bleeding after hd  K =6.5 (eating Potatoes), hgb 12 inr 3.44 ([facility] doses/ per pt missed my apt recently) No bleeding of avf  With cannulation 400 bfr  /5.2 uf gl bp stable 112/69  , some swelling of R ua  avf  And minimal swelling of R  Lower arm , Plan HD  Then dc home  With [facility] to document fu. Of AVF/ Also Co some erythema L lower leg area of car door trauma  ,lost prior bactrim ds  Pills rx  Doxycycline  100mg  bid written fu at [facility]  . Told  To hold coumadin until his managing coumadin clinic sees him/ go in am .    [name] PA -C  Pt seen, examined and agree w A/P as above.  Kelly Splinter MD [employer] pager [phone]    cell [phone] 07/25/2016, 10:16 AM

## 2016-07-25 LAB — THC,MS,WB/SP RFX
Cannabidiol: NEGATIVE ng/mL
Cannabinoid Confirmation: POSITIVE
Cannabinol: NEGATIVE ng/mL
Carboxy-THC: 11.6 ng/mL
Hydroxy-THC: 1.9 ng/mL
Tetrahydrocannabinol(THC): 3.5 ng/mL

## 2016-07-25 LAB — DRUG SCREEN 10 W/CONF, WB
Amphetamines, IA: NEGATIVE ng/mL
BARBITURATES, IA: NEGATIVE ug/mL
Benzodiazepines, IA: NEGATIVE ng/mL
Cocaine/Metabolite, IA: NEGATIVE ng/mL
METHADONE, IA: NEGATIVE ng/mL
OPIATES, IA: NEGATIVE ng/mL
Oxycodones, IA: POSITIVE ng/mL — AB
Phencyclidine, IA: NEGATIVE ng/mL
Propoxyphene, IA: NEGATIVE ng/mL
THC (MARIJUANA) MTB,IA: POSITIVE ng/mL — AB

## 2016-07-25 LAB — OXYCODONES,MS,WB/SP RFX
OXYCODONES CONFIRMATION: POSITIVE
Oxycocone: 8.8 ng/mL
Oxymorphone: NEGATIVE ng/mL

## 2016-07-26 ENCOUNTER — Other Ambulatory Visit: Payer: Self-pay

## 2016-07-26 DIAGNOSIS — T82510A Breakdown (mechanical) of surgically created arteriovenous fistula, initial encounter: Secondary | ICD-10-CM

## 2016-07-26 DIAGNOSIS — I8291 Chronic embolism and thrombosis of unspecified vein: Secondary | ICD-10-CM | POA: Diagnosis not present

## 2016-07-26 DIAGNOSIS — D509 Iron deficiency anemia, unspecified: Secondary | ICD-10-CM | POA: Diagnosis not present

## 2016-07-26 DIAGNOSIS — N2581 Secondary hyperparathyroidism of renal origin: Secondary | ICD-10-CM | POA: Diagnosis not present

## 2016-07-26 DIAGNOSIS — N186 End stage renal disease: Secondary | ICD-10-CM | POA: Diagnosis not present

## 2016-07-26 NOTE — Progress Notes (Signed)
Internal Medicine Clinic Attending  Case discussed with Dr. Saraiya at the time of the visit.  We reviewed the resident's history and exam and pertinent patient test results.  I agree with the assessment, diagnosis, and plan of care documented in the resident's note.  

## 2016-07-29 DIAGNOSIS — N2581 Secondary hyperparathyroidism of renal origin: Secondary | ICD-10-CM | POA: Diagnosis not present

## 2016-07-29 DIAGNOSIS — D509 Iron deficiency anemia, unspecified: Secondary | ICD-10-CM | POA: Diagnosis not present

## 2016-07-29 DIAGNOSIS — N186 End stage renal disease: Secondary | ICD-10-CM | POA: Diagnosis not present

## 2016-07-30 ENCOUNTER — Encounter: Payer: Self-pay | Admitting: Family

## 2016-07-31 DIAGNOSIS — D509 Iron deficiency anemia, unspecified: Secondary | ICD-10-CM | POA: Diagnosis not present

## 2016-07-31 DIAGNOSIS — N2581 Secondary hyperparathyroidism of renal origin: Secondary | ICD-10-CM | POA: Diagnosis not present

## 2016-07-31 DIAGNOSIS — N186 End stage renal disease: Secondary | ICD-10-CM | POA: Diagnosis not present

## 2016-08-01 ENCOUNTER — Encounter: Payer: Self-pay | Admitting: Family

## 2016-08-01 ENCOUNTER — Ambulatory Visit (HOSPITAL_COMMUNITY)
Admission: RE | Admit: 2016-08-01 | Discharge: 2016-08-01 | Disposition: A | Discharge status: Home / Self Care | Payer: Medicare Other | Source: Ambulatory Visit | Attending: Vascular Surgery | Admitting: Vascular Surgery

## 2016-08-01 ENCOUNTER — Ambulatory Visit (INDEPENDENT_AMBULATORY_CARE_PROVIDER_SITE_OTHER): Payer: Medicare Other | Admitting: Family

## 2016-08-01 VITALS — BP 114/78 | HR 107 | Temp 97.4°F | Resp 16 | Ht 74.0 in | Wt 183.0 lb

## 2016-08-01 DIAGNOSIS — T82898A Other specified complication of vascular prosthetic devices, implants and grafts, initial encounter: Secondary | ICD-10-CM | POA: Diagnosis not present

## 2016-08-01 DIAGNOSIS — N186 End stage renal disease: Secondary | ICD-10-CM

## 2016-08-01 DIAGNOSIS — F17200 Nicotine dependence, unspecified, uncomplicated: Secondary | ICD-10-CM

## 2016-08-01 DIAGNOSIS — Z720 Tobacco use: Secondary | ICD-10-CM

## 2016-08-01 DIAGNOSIS — Y838 Other surgical procedures as the cause of abnormal reaction of the patient, or of later complication, without mention of misadventure at the time of the procedure: Secondary | ICD-10-CM | POA: Diagnosis not present

## 2016-08-01 DIAGNOSIS — E079 Disorder of thyroid, unspecified: Secondary | ICD-10-CM | POA: Insufficient documentation

## 2016-08-01 DIAGNOSIS — I129 Hypertensive chronic kidney disease with stage 1 through stage 4 chronic kidney disease, or unspecified chronic kidney disease: Secondary | ICD-10-CM | POA: Diagnosis not present

## 2016-08-01 DIAGNOSIS — B20 Human immunodeficiency virus [HIV] disease: Secondary | ICD-10-CM | POA: Diagnosis not present

## 2016-08-01 DIAGNOSIS — N184 Chronic kidney disease, stage 4 (severe): Secondary | ICD-10-CM | POA: Insufficient documentation

## 2016-08-01 DIAGNOSIS — I770 Arteriovenous fistula, acquired: Secondary | ICD-10-CM | POA: Diagnosis not present

## 2016-08-01 DIAGNOSIS — F329 Major depressive disorder, single episode, unspecified: Secondary | ICD-10-CM | POA: Diagnosis not present

## 2016-08-01 DIAGNOSIS — T82510A Breakdown (mechanical) of surgically created arteriovenous fistula, initial encounter: Secondary | ICD-10-CM | POA: Insufficient documentation

## 2016-08-01 DIAGNOSIS — F419 Anxiety disorder, unspecified: Secondary | ICD-10-CM | POA: Insufficient documentation

## 2016-08-01 DIAGNOSIS — Z992 Dependence on renal dialysis: Secondary | ICD-10-CM

## 2016-08-01 NOTE — Patient Instructions (Signed)
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
## 2016-08-02 DIAGNOSIS — D509 Iron deficiency anemia, unspecified: Secondary | ICD-10-CM | POA: Diagnosis not present

## 2016-08-02 DIAGNOSIS — N2581 Secondary hyperparathyroidism of renal origin: Secondary | ICD-10-CM | POA: Diagnosis not present

## 2016-08-02 DIAGNOSIS — N186 End stage renal disease: Secondary | ICD-10-CM | POA: Diagnosis not present

## 2016-08-07 DIAGNOSIS — N186 End stage renal disease: Secondary | ICD-10-CM | POA: Diagnosis not present

## 2016-08-07 DIAGNOSIS — D509 Iron deficiency anemia, unspecified: Secondary | ICD-10-CM | POA: Diagnosis not present

## 2016-08-07 DIAGNOSIS — N2581 Secondary hyperparathyroidism of renal origin: Secondary | ICD-10-CM | POA: Diagnosis not present

## 2016-08-08 ENCOUNTER — Telehealth: Payer: Self-pay | Admitting: *Deleted

## 2016-08-08 NOTE — Telephone Encounter (Signed)
Call from pt's girlfriend - has a wound on left leg which is open but beginning to develop a scab. Hit his leg about a month ago. Purplish- red in color, started yesterday; better today. Stated wound is from mid calf to ankle. Went to ED 8/9 for his bleeding fistula;she stated they mentioned the wound. No available appts at this time. Appt given for tomorrow morning at 0915AM; but instructed to go to ED if condition changes.

## 2016-08-09 ENCOUNTER — Inpatient Hospital Stay (HOSPITAL_COMMUNITY): Payer: Medicare Other

## 2016-08-09 ENCOUNTER — Inpatient Hospital Stay (HOSPITAL_COMMUNITY)
Admission: AD | Admit: 2016-08-09 | Discharge: 2016-08-10 | DRG: 602 | Disposition: A | Discharge status: Home / Self Care | Payer: Medicare Other | Source: Ambulatory Visit | Attending: Internal Medicine | Admitting: Internal Medicine

## 2016-08-09 ENCOUNTER — Encounter (HOSPITAL_COMMUNITY): Payer: Self-pay | Admitting: General Practice

## 2016-08-09 ENCOUNTER — Ambulatory Visit (INDEPENDENT_AMBULATORY_CARE_PROVIDER_SITE_OTHER): Payer: Medicare Other | Admitting: Internal Medicine

## 2016-08-09 VITALS — BP 122/84 | HR 103 | Temp 97.7°F | Ht 74.0 in | Wt 192.2 lb

## 2016-08-09 DIAGNOSIS — Z992 Dependence on renal dialysis: Secondary | ICD-10-CM

## 2016-08-09 DIAGNOSIS — L03116 Cellulitis of left lower limb: Secondary | ICD-10-CM

## 2016-08-09 DIAGNOSIS — F17200 Nicotine dependence, unspecified, uncomplicated: Secondary | ICD-10-CM | POA: Diagnosis present

## 2016-08-09 DIAGNOSIS — Z79891 Long term (current) use of opiate analgesic: Secondary | ICD-10-CM

## 2016-08-09 DIAGNOSIS — N186 End stage renal disease: Secondary | ICD-10-CM

## 2016-08-09 DIAGNOSIS — I5022 Chronic systolic (congestive) heart failure: Secondary | ICD-10-CM

## 2016-08-09 DIAGNOSIS — Z9119 Patient's noncompliance with other medical treatment and regimen: Secondary | ICD-10-CM

## 2016-08-09 DIAGNOSIS — Z21 Asymptomatic human immunodeficiency virus [HIV] infection status: Secondary | ICD-10-CM

## 2016-08-09 DIAGNOSIS — I132 Hypertensive heart and chronic kidney disease with heart failure and with stage 5 chronic kidney disease, or end stage renal disease: Secondary | ICD-10-CM | POA: Diagnosis present

## 2016-08-09 DIAGNOSIS — Z86718 Personal history of other venous thrombosis and embolism: Secondary | ICD-10-CM | POA: Diagnosis not present

## 2016-08-09 DIAGNOSIS — Z833 Family history of diabetes mellitus: Secondary | ICD-10-CM | POA: Diagnosis not present

## 2016-08-09 DIAGNOSIS — M79641 Pain in right hand: Secondary | ICD-10-CM | POA: Diagnosis present

## 2016-08-09 DIAGNOSIS — Q613 Polycystic kidney, unspecified: Secondary | ICD-10-CM

## 2016-08-09 DIAGNOSIS — B9689 Other specified bacterial agents as the cause of diseases classified elsewhere: Secondary | ICD-10-CM

## 2016-08-09 DIAGNOSIS — F17210 Nicotine dependence, cigarettes, uncomplicated: Secondary | ICD-10-CM | POA: Diagnosis present

## 2016-08-09 DIAGNOSIS — I120 Hypertensive chronic kidney disease with stage 5 chronic kidney disease or end stage renal disease: Secondary | ICD-10-CM | POA: Diagnosis not present

## 2016-08-09 DIAGNOSIS — B20 Human immunodeficiency virus [HIV] disease: Secondary | ICD-10-CM | POA: Diagnosis present

## 2016-08-09 DIAGNOSIS — Z8249 Family history of ischemic heart disease and other diseases of the circulatory system: Secondary | ICD-10-CM | POA: Diagnosis not present

## 2016-08-09 DIAGNOSIS — Z7951 Long term (current) use of inhaled steroids: Secondary | ICD-10-CM | POA: Diagnosis not present

## 2016-08-09 DIAGNOSIS — M7989 Other specified soft tissue disorders: Secondary | ICD-10-CM | POA: Diagnosis present

## 2016-08-09 DIAGNOSIS — Z86711 Personal history of pulmonary embolism: Secondary | ICD-10-CM | POA: Diagnosis not present

## 2016-08-09 DIAGNOSIS — L0390 Cellulitis, unspecified: Secondary | ICD-10-CM

## 2016-08-09 DIAGNOSIS — Z7901 Long term (current) use of anticoagulants: Secondary | ICD-10-CM

## 2016-08-09 DIAGNOSIS — N2581 Secondary hyperparathyroidism of renal origin: Secondary | ICD-10-CM | POA: Diagnosis not present

## 2016-08-09 DIAGNOSIS — D631 Anemia in chronic kidney disease: Secondary | ICD-10-CM | POA: Diagnosis not present

## 2016-08-09 HISTORY — DX: Gastro-esophageal reflux disease without esophagitis: K21.9

## 2016-08-09 HISTORY — DX: End stage renal disease: N18.6

## 2016-08-09 HISTORY — DX: Dependence on renal dialysis: Z99.2

## 2016-08-09 HISTORY — DX: Unspecified chronic bronchitis: J42

## 2016-08-09 HISTORY — DX: Chronic obstructive pulmonary disease, unspecified: J44.9

## 2016-08-09 LAB — BASIC METABOLIC PANEL
Anion gap: 20 — ABNORMAL HIGH (ref 5–15)
BUN: 87 mg/dL — AB (ref 6–20)
CHLORIDE: 95 mmol/L — AB (ref 101–111)
CO2: 26 mmol/L (ref 22–32)
CREATININE: 11.07 mg/dL — AB (ref 0.61–1.24)
Calcium: 6.6 mg/dL — ABNORMAL LOW (ref 8.9–10.3)
GFR calc Af Amer: 6 mL/min — ABNORMAL LOW (ref >60)
GFR calc non Af Amer: 5 mL/min — ABNORMAL LOW (ref >60)
Glucose, Bld: 99 mg/dL (ref 65–99)
POTASSIUM: 5 mmol/L (ref 3.5–5.1)
Sodium: 141 mmol/L (ref 135–145)

## 2016-08-09 LAB — PROTIME-INR
INR: 3.19
PROTHROMBIN TIME: 33.4 s — AB (ref 11.4–15.2)

## 2016-08-09 LAB — CBC WITH DIFFERENTIAL/PLATELET
Basophils Absolute: 0 10*3/uL (ref 0.0–0.1)
Basophils Relative: 0 %
Eosinophils Absolute: 0.1 10*3/uL (ref 0.0–0.7)
Eosinophils Relative: 2 %
HCT: 39.2 % (ref 39.0–52.0)
Hemoglobin: 12.4 g/dL — ABNORMAL LOW (ref 13.0–17.0)
LYMPHS ABS: 1.3 10*3/uL (ref 0.7–4.0)
LYMPHS PCT: 23 %
MCH: 32 pg (ref 26.0–34.0)
MCHC: 31.6 g/dL (ref 30.0–36.0)
MCV: 101 fL — AB (ref 78.0–100.0)
Monocytes Absolute: 0.5 10*3/uL (ref 0.1–1.0)
Monocytes Relative: 10 %
NEUTROS PCT: 65 %
Neutro Abs: 3.6 10*3/uL (ref 1.7–7.7)
Platelets: 129 10*3/uL — ABNORMAL LOW (ref 150–400)
RBC: 3.88 MIL/uL — AB (ref 4.22–5.81)
RDW: 16.4 % — ABNORMAL HIGH (ref 11.5–15.5)
WBC: 5.5 10*3/uL (ref 4.0–10.5)

## 2016-08-09 LAB — SEDIMENTATION RATE: Sed Rate: 5 mm/hr (ref 0–16)

## 2016-08-09 LAB — C-REACTIVE PROTEIN: CRP: 1.8 mg/dL — ABNORMAL HIGH (ref <1.0)

## 2016-08-09 MED ORDER — LACTULOSE 10 GM/15ML PO SOLN: 15.0000 g | ORAL | Status: DC

## 2016-08-09 MED ORDER — DRONABINOL 2.5 MG PO CAPS
5.0000 mg | ORAL_CAPSULE | Freq: Two times a day (BID) | ORAL | Status: DC
  Administered 2016-08-09 – 2016-08-10 (×2): 5 mg via ORAL
  Filled 2016-08-09 (×2): qty 2

## 2016-08-09 MED ORDER — CALCIUM ACETATE (PHOS BINDER) 667 MG PO CAPS
667.0000 mg | ORAL_CAPSULE | Freq: Three times a day (TID) | ORAL | Status: DC
  Administered 2016-08-09 – 2016-08-10 (×3): 667 mg via ORAL
  Filled 2016-08-09 (×3): qty 1

## 2016-08-09 MED ORDER — LAMIVUDINE 100 MG PO TABS: 50.0000 mg | ORAL_TABLET | Freq: Every day | ORAL | Status: DC

## 2016-08-09 MED ORDER — DEXTROSE 5 % IV SOLN
2.0000 g | INTRAVENOUS | Status: DC
  Administered 2016-08-09 – 2016-08-10 (×2): 2 g via INTRAVENOUS
  Filled 2016-08-09 (×2): qty 2

## 2016-08-09 MED ORDER — PRAMIPEXOLE DIHYDROCHLORIDE 0.25 MG PO TABS
0.5000 mg | ORAL_TABLET | Freq: Every day | ORAL | Status: DC
  Administered 2016-08-09: 0.5 mg via ORAL
  Filled 2016-08-09: qty 2

## 2016-08-09 MED ORDER — OXYCODONE-ACETAMINOPHEN 5-325 MG PO TABS
1.0000 | ORAL_TABLET | ORAL | Status: DC | PRN
  Administered 2016-08-09 – 2016-08-10 (×2): 1 via ORAL
  Filled 2016-08-09 (×2): qty 1

## 2016-08-09 MED ORDER — EFAVIRENZ 600 MG PO TABS
600.0000 mg | ORAL_TABLET | Freq: Every day | ORAL | Status: DC
  Administered 2016-08-09: 600 mg via ORAL
  Filled 2016-08-09: qty 1

## 2016-08-09 MED ORDER — CALCITRIOL 0.5 MCG PO CAPS: 1.5000 ug | ORAL_CAPSULE | ORAL | Status: DC

## 2016-08-09 MED ORDER — ABACAVIR SULFATE 300 MG PO TABS
600.0000 mg | ORAL_TABLET | Freq: Every day | ORAL | Status: DC
  Administered 2016-08-10: 600 mg via ORAL
  Filled 2016-08-09: qty 2

## 2016-08-09 MED ORDER — OXYCODONE HCL 5 MG PO TABS
5.0000 mg | ORAL_TABLET | ORAL | Status: DC | PRN
  Administered 2016-08-09 – 2016-08-10 (×2): 5 mg via ORAL
  Filled 2016-08-09 (×2): qty 1

## 2016-08-09 MED ORDER — CINACALCET HCL 30 MG PO TABS
30.0000 mg | ORAL_TABLET | Freq: Every day | ORAL | Status: DC
  Administered 2016-08-10: 30 mg via ORAL
  Filled 2016-08-09: qty 1

## 2016-08-09 MED ORDER — NICOTINE 7 MG/24HR TD PT24
7.0000 mg | MEDICATED_PATCH | Freq: Every day | TRANSDERMAL | Status: DC
  Administered 2016-08-10: 7 mg via TRANSDERMAL
  Filled 2016-08-09: qty 1

## 2016-08-09 MED ORDER — OXYCODONE-ACETAMINOPHEN 10-325 MG PO TABS: 1.0000 | ORAL_TABLET | ORAL | Status: DC | PRN

## 2016-08-09 MED ORDER — LAMIVUDINE 10 MG/ML PO SOLN
50.0000 mg | Freq: Every day | ORAL | Status: DC
  Administered 2016-08-09: 50 mg via ORAL
  Filled 2016-08-09: qty 5

## 2016-08-09 NOTE — Consult Note (Signed)
[facility] Nurse wound consult note Reason for Consult: Consult requested for left leg cellulitis.  Pt states he previously had a wound to the middle of his shin which has closed at this time. Wound type: Left shin with .2X.2cm raised area with shallow crater in the middle with pink dry wound bed; no odor or drainage.   Periwound: Generalized erythremia to left calf, dark reddish purple. Dressing procedure/placement/frequency: No role for topical treatment at this time.  Pt is awaiting vascular studies. Please re-consult if further assistance is needed.  Thank-you,  Julien Girt MSN, [place], [place], [place], [place]
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
## 2016-08-09 NOTE — Progress Notes (Signed)
Patient arrived via wheelchair from the [facility].  MD paged for orders.  Jillyn Ledger, MBA, BSN, RN

## 2016-08-09 NOTE — Procedures (Signed)
  I was present at this dialysis session, have reviewed the session itself and made  appropriate changes Kelly Splinter MD [place] pager [phone]    cell [phone] 08/09/2016, 4:37 PM

## 2016-08-09 NOTE — Progress Notes (Signed)
Report called to Nurse on [address].  Patient transported via wheelchair to [facility] [address].  Patient alert and oriented accompanied by friend.  Sander Nephew, RN 08/09/2016 10:40 AM

## 2016-08-09 NOTE — Progress Notes (Signed)
Internal Medicine Clinic Attending  I saw and evaluated the patient.  I personally confirmed the key portions of the history and exam documented by Dr. Danford Bad and I reviewed pertinent patient test results.  The assessment, diagnosis, and plan were formulated together and I agree with the documentation in the resident's note.  I agree that admission with IV antibiotics is warranted.  He has failed two courses of oral Abx with actual worsening of the rash per him.  He will likely be able to have a short admission and possible have a continued course of IV abx with HD.
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
## 2016-08-09 NOTE — Assessment & Plan Note (Signed)
Patient has ESRD secondary to polycystic kidney disease. Patient due for dialysis today however unable to make appointment due to admission. -Patient will likely require inpatient dialysis today or tomorrow.

## 2016-08-09 NOTE — Assessment & Plan Note (Signed)
Patient reports approximately 1 month ago he hit is left lower extremity on the footboard of his bed. He has some concern he may have seen the bone. Since his injury reports increased redness and swelling of his lower extremity despite 2 course of antibiotics (10 days of Bactrim and 7 days of Doxycycline). He reports his leg feels like its "on fire" and the lower extremity is tender to palpation.  Because the patient has had worsening of his symptoms with 2 courses of outpatient antibiotics, and because he feels he may have exposed the bone, I feel admission for IV antibiotics with workup for possible osteomyelitis is appropriate. -CBC w/diff and BMP ordered -Patient may require imaging of left lower extremity to evaluate depth of infection -Case discussed with Dr. Hulen Luster who accepted admission to IM Teaching Service.
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
## 2016-08-10 ENCOUNTER — Other Ambulatory Visit: Payer: Self-pay | Admitting: Internal Medicine

## 2016-08-10 ENCOUNTER — Encounter (HOSPITAL_COMMUNITY): Payer: Self-pay

## 2016-08-10 ENCOUNTER — Other Ambulatory Visit: Payer: Self-pay | Admitting: Cardiology

## 2016-08-10 DIAGNOSIS — B20 Human immunodeficiency virus [HIV] disease: Secondary | ICD-10-CM

## 2016-08-10 LAB — RENAL FUNCTION PANEL
Albumin: 2.6 g/dL — ABNORMAL LOW (ref 3.5–5.0)
Anion gap: 16 — ABNORMAL HIGH (ref 5–15)
BUN: 43 mg/dL — ABNORMAL HIGH (ref 6–20)
CO2: 25 mmol/L (ref 22–32)
Calcium: 7.2 mg/dL — ABNORMAL LOW (ref 8.9–10.3)
Chloride: 95 mmol/L — ABNORMAL LOW (ref 101–111)
Creatinine, Ser: 7.48 mg/dL — ABNORMAL HIGH (ref 0.61–1.24)
GFR calc Af Amer: 9 mL/min — ABNORMAL LOW
GFR calc non Af Amer: 8 mL/min — ABNORMAL LOW
Glucose, Bld: 76 mg/dL (ref 65–99)
Phosphorus: 8.6 mg/dL — ABNORMAL HIGH (ref 2.5–4.6)
Potassium: 3.8 mmol/L (ref 3.5–5.1)
Sodium: 136 mmol/L (ref 135–145)

## 2016-08-10 LAB — PROTIME-INR
INR: 2.58
Prothrombin Time: 28.2 s — ABNORMAL HIGH (ref 11.4–15.2)

## 2016-08-10 MED ORDER — CEPHALEXIN 500 MG PO CAPS: 500.0000 mg | ORAL_CAPSULE | Freq: Four times a day (QID) | ORAL | 0 refills | Status: AC

## 2016-08-10 MED ORDER — OXYCODONE-ACETAMINOPHEN 10-325 MG PO TABS: 1.0000 | ORAL_TABLET | ORAL | 0 refills | Status: DC | PRN

## 2016-08-10 NOTE — Progress Notes (Signed)
   Subjective: No acute events overnight. Patient says that his redness and leg pain have improved overnight. He denies nausea, vomiting or abdominal pain. He is able to ambulate. He has no additional acute complaints or concerns this morning. He is medically stable and ready for discharge.  Objective:  Vital signs in last 24 hours: Vitals:   08/09/16 1849 08/09/16 2112 08/10/16 0450 08/10/16 0945  BP: 115/78 117/78 115/76 128/83  Pulse: 90 90 92 (!) 106  Resp: 18 19 18 20   Temp: 97.8 F (36.6 C) 97.6 F (36.4 C) 98.2 F (36.8 C) 99.2 F (37.3 C)  TempSrc: Oral Oral Oral Oral  SpO2: 97% 100% 100% 100%  Weight:  164 lb 10.9 oz (74.7 kg)     Physical Exam  Constitutional: He is oriented to person, place, and time. He appears well-developed and well-nourished.  In no acute distress  HENT:  Head: Normocephalic and atraumatic.  Cardiovascular: Normal rate and regular rhythm.  Exam reveals no gallop and no friction rub.   No murmur heard. Respiratory: Effort normal and breath sounds normal. No respiratory distress. He has no wheezes.  GI: Soft. Bowel sounds are normal. He exhibits no distension. There is no tenderness.  Musculoskeletal: He exhibits tenderness. He exhibits no edema.  Left lower extremity cellulitis. Area of erythema has improved markedly since yesterday. There is only mild warmth with touch and mild tenderness on palpation. The patient is able to ambulate.  Neurological: He is alert and oriented to person, place, and time.  Skin:  Multiple excoriations bilaterally on the patient's lower and upper extremities.     Assessment/Plan: Mr. Tolhurst is a 40 year old male with a past medical history of end-stage renal disease on hemodialysis secondary to PCKD, hypertension, pulmonary embolism/DVT who presented with left leg swelling and worsening erythema 2/2 left lower extremity cellulitis. He failed outpatient therapy of this cellulitis with trimethoprim-sulfamethoxazole and  doxycycline. He was admitted for IV antibiotics.  1. Left lower extremity cellulitis Patient failed outpatient management with one week of trimethoprim-sulfamethoxazole and one week of doxycycline for left lower extremity cellulitis. He was admitted for intravenous antibiotics. He has been afebrile. His failure of outpatient antibiotic therapy is most likely secondary to poor Streptococcus coverage. Both trimethoprim-sulfamethoxazole and doxycycline will cover staph but are less effective in treating cellulitis secondary to streptococcal species. -- Received IV ceftriaxone while inpatient -- He'll be discharged with cephalexin 500 mg 4 times daily for 1 week -- We'll have follow-up with the [facility] internal medicine clinic next week to reevaluate his left lower extremity cellulitis.   2. End-stage renal disease on hemodialysis MWF -- Continue Sensipar and PhosLo -- Continue Marinol secondary to decreased appetite -- Continue with current hemodialysis schedule  3. HIV Most recent CD4 count of 750 on 05/28/16 -- Continue efavirenz, abacavir and lamivudine  4. History of lower extremity DVT and PE -- Coumadin per pharmacy  Dispo: Discharged today.   Ophelia Shoulder, MD 08/10/2016, 10:10 AM Pager: [phone]

## 2016-08-10 NOTE — Progress Notes (Signed)
[facility] Progress Note  Assessment/Plan: 1 Left lower leg cellulitis - received IV Rocephin cont cephalexin 500mg  PO TID 1 week - Blood cultures drawn 8/25 pending  2 ESRD -  [facility] MWF - cont on schedule 3 Hypertension/volume  - no edema chronically low BP - OP SBPs 100-130s getting to EDW  HD yesterday net UF 4L  4 Anemia  - Hgb 12.4 no need for ESA  5 Metabolic bone disease - Ca 7.2 > 6.6  - last OP Ca 8.0 Cont VDRA/ Phoslo -  Non compliant with outpatient binders  6 Nutrition - Albumin 3.8 renal diet, vitamins  7 Right hand pain/swelling -  Pain and swelling in right hand and forearm during HD - not with every treatment followed by VVS for steal symptoms  Lynnda Child PA-C [place] 08/10/2016,10:35 AM  LOS: 1 day   Pt seen, examined and agree w A/P as above. Good response to IV Rocephin, for dc home today on po Keflex to complete course of abx for LLE cellulitis.  Kelly Splinter MD [facility] pager [phone]    cell [phone] 08/10/2016, 12:06 PM    Subjective:   Leg swelling/pain  improved. Feels much better today. Tolerated full 4 hour HD treatment yesterday net UF 4000 mL. Had some right arm swelling pain in last hour of treatment - has been ongoing for months. For discharge today.   Objective Vitals:   08/09/16 1849 08/09/16 2112 08/10/16 0450 08/10/16 0945  BP: 115/78 117/78 115/76 128/83  Pulse: 90 90 92 (!) 106  Resp: 18 19 18 20   Temp: 97.8 F (36.6 C) 97.6 F (36.4 C) 98.2 F (36.8 C) 99.2 F (37.3 C)  TempSrc: Oral Oral Oral Oral  SpO2: 97% 100% 100% 100%  Weight:  74.7 kg (164 lb 10.9 oz)     Physical Exam General: Thin middle aged male NAD Heart: RRR S1 S2  Lungs: CTA bilaterally without wheezes, rales, or rhonchi. Breathing is unlabored. Abdomen: soft NT + BS Extremities: no LE edema;  L anterior tibia with 1-2 cm raised closed lesion with further  Diffuse erythema and swelling to shin - greatly  improved from 8/25 exam no drainage Dialysis Access: RUE AVF with multiple aneurysms +thrill  Dialysis Orders: [facility] MWF 4h 200 F BFR 400/800 2/2 UF Profile 2   82kg RUE AVF Heparin 3200 Calcitriol 1.5 mcg PO q treatment OP Labs: Hgb 12.4Tsat 18% K 5.3 Ca 8.0 P 9.1 PTH 721   Additional Objective Labs: Basic Metabolic Panel:  Recent Labs Lab 08/09/16 1024 08/10/16 0608  NA 141 136  K 5.0 3.8  CL 95* 95*  CO2 26 25  GLUCOSE 99 76  BUN 87* 43*  CREATININE 11.07* 7.48*  CALCIUM 6.6* 7.2*  PHOS  --  8.6*   Liver Function Tests:  Recent Labs Lab 08/10/16 0608  ALBUMIN 2.6*   No results for input(s): LIPASE, AMYLASE in the last 168 hours. CBC:  Recent Labs Lab 08/09/16 1024  WBC 5.5  NEUTROABS 3.6  HGB 12.4*  HCT 39.2  MCV 101.0*  PLT 129*   Blood Culture    Component Value Date/Time   SDES URINE, RANDOM 08/12/2014 1438   SPECREQUEST NONE 08/12/2014 1438   CULT NO GROWTH Performed at [facility] 08/12/2014 1438   REPTSTATUS 08/13/2014 FINAL 08/12/2014 1438    Cardiac Enzymes: No results for input(s): CKTOTAL, CKMB, CKMBINDEX, TROPONINI in the last 168 hours. CBG: No results for input(s): GLUCAP  in the last 168 hours. Iron Studies: No results for input(s): IRON, TIBC, TRANSFERRIN, FERRITIN in the last 72 hours. Lab Results  Component Value Date   INR 2.58 08/10/2016   INR 3.19 08/09/2016   INR 3.44 07/24/2016   Studies/Results: Dg Tibia/fibula Left  Result Date: 08/09/2016 CLINICAL DATA:  Left lower leg cellulitis. EXAM: LEFT TIBIA AND FIBULA - 2 VIEW COMPARISON:  Left ankle films from 05/31/2013. FINDINGS: No evidence for fracture. No worrisome lytic or sclerotic osseous abnormality. No evidence for gas within the visualized soft tissues of the leg. IMPRESSION: Negative. Electronically Signed   By: Misty Stanley M.D.   On: 08/09/2016 22:04   Medications:   . abacavir  600 mg Oral Daily  . calcitRIOL  1.5 mcg Oral Q M,W,F-HD  . calcium  acetate  667 mg Oral TID WC  . cefTRIAXone (ROCEPHIN)  IV  2 g Intravenous Q24H  . cinacalcet  30 mg Oral Q breakfast  . dronabinol  5 mg Oral BID AC  . efavirenz  600 mg Oral QHS  . lactulose  15 g Oral Q M,W,F  . lamiVUDine  50 mg Oral QHS  . nicotine  7 mg Transdermal Daily  . pramipexole  0.5 mg Oral QHS

## 2016-08-10 NOTE — Progress Notes (Signed)
Pt discharge instructions and prescriptions given, pt verbalized understanding.  Pt left floor via wheelchair accompanied by staff and family.

## 2016-08-10 NOTE — Discharge Summary (Signed)
Name: Brian [name] MRN: [id number] DOB: 10-10-1976 40 y.o. PCP: Bartholomew Crews, MD  Date of Admission: 08/09/2016 10:46 AM Date of Discharge: 08/10/2016 Attending Physician: Aldine Contes, MD  Discharge Diagnosis: 1. Left lower leg cellulitis    Discharge Medications:   Medication List    TAKE these medications   abacavir 300 MG tablet Commonly known as:  ZIAGEN TAKE 1 TABLET BY MOUTH 2 TIMES DAILY. What changed:  how much to take  when to take this  additional instructions   calcium acetate 667 MG capsule Commonly known as:  PHOSLO Take by mouth 3 (three) times daily with meals. *takes 2 capsules with every meal and 1 capsule with snacks   cephALEXin 500 MG capsule Commonly known as:  KEFLEX Take 1 capsule (500 mg total) by mouth 4 (four) times daily.   cetirizine 5 MG tablet Commonly known as:  ZYRTEC TAKE 1 TABLET BY MOUTH DAILY. What changed:  See the new instructions.   cyclobenzaprine 5 MG tablet Commonly known as:  FLEXERIL TAKE 1 TAB BY MOUTH 3 TIMES DAILY AS NEEDED FOR MUSCLE SPASMS   dronabinol 5 MG capsule Commonly known as:  MARINOL Take 1 capsule (5 mg total) by mouth 2 (two) times daily before lunch and supper.   efavirenz 600 MG tablet Commonly known as:  SUSTIVA TAKE 1 TABLET AT BEDTIME WITH WITH Lamivudine (Epivir) and Abacavir (Ziagen).   fluticasone 50 MCG/ACT nasal spray Commonly known as:  FLONASE USE 2 SPRAYS INTO BOTH NOSTRILS DAILY.   guaiFENesin 600 MG 12 hr tablet Commonly known as:  MUCINEX Take 1 tablet (600 mg total) by mouth 2 (two) times daily.   lactulose 10 GM/15ML solution Commonly known as:  CHRONULAC Take 15 g by mouth every Monday, Wednesday, and Friday.   lamivudine 100 MG tablet Commonly known as:  EPIVIR TAKE 1/2  TABLET BY MOUTH ONCE A DAY   lisinopril 2.5 MG tablet Commonly known as:  PRINIVIL,ZESTRIL TAKE 1 TABLET (2.5 MG TOTAL) BY MOUTH DAILY. NEED OV.   multivitamin Tabs tablet Take 1  tablet by mouth at bedtime.   naloxone HCl 4 MG/0.1ML Liqd Commonly known as:  NARCAN Place 1 spray into the nose once. Use for overdose and dial 9-1-1. Repeat dose in 2-3 minutes if needed. MEDICAID: [id number] Q ([name])   nicotine 21 mg/24hr patch Commonly known as:  NICODERM CQ - dosed in mg/24 hours Place 21 mg onto the skin daily.   oxyCODONE-acetaminophen 10-325 MG tablet Commonly known as:  PERCOCET Take 1 tablet by mouth every 4 (four) hours as needed for pain. Earliest fill date 06/08/16.   pramipexole 0.25 MG tablet Commonly known as:  MIRAPEX TAKE 2 TABLETS BY MOUTH AT BEDTIME   PROAIR HFA 108 (90 Base) MCG/ACT inhaler Generic drug:  albuterol INHALE 2 PUFFS EVERY 4  HRS AS NEEDED FOR WHEEZING SHORTNESS OF BREATH   SENSIPAR 30 MG tablet Generic drug:  cinacalcet Take 30 mg by mouth daily.   warfarin 5 MG tablet Commonly known as:  COUMADIN TAKE 2 TABLETS BY MOUTH DAILY       Disposition and follow-up:   BrianYoskar L [name] was discharged from [facility] in Good condition.  At the hospital follow up visit please address:  1.  Please follow-up with Brian [name]'s left lower extremity cellulitis and make sure that this continues to improve.  2.  Labs / imaging needed at time of follow-up: None  3.  Pending labs/ test needing follow-up:  None  Follow-up Appointments: Follow-up appointment scheduled with the [facility]  Hospital Course by problem list:  1. Left lower leg cellulitis Mr. Hoglund was admitted to the [facility] from the internal medicine clinic for left lower leg cellulitis that had failed 2 courses of outpatient antibiotics. The patient had been treated with a one-week course of Bactrim which did not improve his infection. Following  Bactrim he was treated with an additional 1 week course of doxycycline. He also failed outpatient doxycycline. He was admitted for IV antibiotics and to rule out deep tissue  infection or osteomyelitis. While inpatient he had a diagnostic tibia/fibula x-ray which showed no evidence of fracture, no worrisome lytic or sclerotic osseous abnormality and no evidence of gas in the soft tissue spaces which would be indicative of infection. During his admission he remained afebrile and hemodynamically stable. He was treated with IV ceftriaxone. He'll be discharged on cephalexin 500 mg 4 times daily for 1 week. Additionally, he will have follow-up with the internal medicine clinic at [facility] in approximately 1 week to ensure the resolution of his left lower extremity cellulitis. At the time of discharge he was afebrile, hemodynamically stable. His cellulitis had improved subjectively and objectively.    Discharge Vitals:   BP 128/83 (BP Location: Left Arm)   Pulse (!) 106   Temp 99.2 F (37.3 C) (Oral)   Resp 20   Wt 164 lb 10.9 oz (74.7 kg)   SpO2 100%   BMI 21.14 kg/m   Pertinent Labs, Studies, and Procedures:  Diagnostic tibia/fibula x-ray which showed no evidence of fracture, worrisome lytic lesion or sclerotic osseous abnormality  Discharge Instructions: Discharge Instructions    Diet - low sodium heart healthy    Complete by:  As directed   Discharge instructions    Complete by:  As directed   Please take Keflex for 7 days.  Follow up in our clinic. Gave you 1 month of pain medication refill (oxycodone 90 tablets)   Increase activity slowly    Complete by:  As directed      Signed: Ophelia Shoulder, MD 08/10/2016, 10:52 AM   Pager: [phone]

## 2016-08-11 LAB — HEMOGLOBIN A1C
HEMOGLOBIN A1C: 5.8 % — AB (ref 4.8–5.6)
MEAN PLASMA GLUCOSE: 120 mg/dL

## 2016-08-12 ENCOUNTER — Ambulatory Visit: Payer: Self-pay | Admitting: Surgery

## 2016-08-12 DIAGNOSIS — D509 Iron deficiency anemia, unspecified: Secondary | ICD-10-CM | POA: Diagnosis not present

## 2016-08-12 DIAGNOSIS — N186 End stage renal disease: Secondary | ICD-10-CM | POA: Diagnosis not present

## 2016-08-12 DIAGNOSIS — N2581 Secondary hyperparathyroidism of renal origin: Secondary | ICD-10-CM | POA: Diagnosis not present

## 2016-08-12 NOTE — Telephone Encounter (Signed)
lisinopril (PRINIVIL,ZESTRIL) 2.5 MG tablet  Medication  Date: 06/28/2016 Department: [address] Office Ordering/Authorizing: Dorothy Spark, MD  Order Providers   Prescribing Provider Encounter Provider  Dorothy Spark, MD Dorothy Spark, MD  Medication Detail    Disp Refills Start End   lisinopril (PRINIVIL,ZESTRIL) 2.5 MG tablet 15 tablet 0 06/28/2016    Sig: TAKE 1 TABLET (2.5 MG TOTAL) BY MOUTH DAILY. NEED OV.   Patient not taking: Reported on 08/09/2016       Notes to Pharmacy: Patient needs to schedule an appointment to receive additional refills. Please call [phone].   E-Prescribing Status: Receipt confirmed by pharmacy (06/28/2016 3:38 PM EDT)   Pharmacy   [place], [address]

## 2016-08-13 ENCOUNTER — Other Ambulatory Visit: Payer: Self-pay | Admitting: *Deleted

## 2016-08-13 DIAGNOSIS — N186 End stage renal disease: Secondary | ICD-10-CM

## 2016-08-14 DIAGNOSIS — N2581 Secondary hyperparathyroidism of renal origin: Secondary | ICD-10-CM | POA: Diagnosis not present

## 2016-08-14 DIAGNOSIS — N186 End stage renal disease: Secondary | ICD-10-CM | POA: Diagnosis not present

## 2016-08-14 DIAGNOSIS — D509 Iron deficiency anemia, unspecified: Secondary | ICD-10-CM | POA: Diagnosis not present

## 2016-08-14 LAB — CULTURE, BLOOD (ROUTINE X 2)
CULTURE: NO GROWTH
Culture: NO GROWTH

## 2016-08-15 DIAGNOSIS — N186 End stage renal disease: Secondary | ICD-10-CM | POA: Diagnosis not present

## 2016-08-15 DIAGNOSIS — I120 Hypertensive chronic kidney disease with stage 5 chronic kidney disease or end stage renal disease: Secondary | ICD-10-CM | POA: Diagnosis not present

## 2016-08-15 DIAGNOSIS — Z992 Dependence on renal dialysis: Secondary | ICD-10-CM | POA: Diagnosis not present

## 2016-08-16 DIAGNOSIS — N2581 Secondary hyperparathyroidism of renal origin: Secondary | ICD-10-CM | POA: Diagnosis not present

## 2016-08-16 DIAGNOSIS — D509 Iron deficiency anemia, unspecified: Secondary | ICD-10-CM | POA: Diagnosis not present

## 2016-08-16 DIAGNOSIS — Z23 Encounter for immunization: Secondary | ICD-10-CM | POA: Diagnosis not present

## 2016-08-16 DIAGNOSIS — N186 End stage renal disease: Secondary | ICD-10-CM | POA: Diagnosis not present

## 2016-08-19 DIAGNOSIS — Z23 Encounter for immunization: Secondary | ICD-10-CM | POA: Diagnosis not present

## 2016-08-19 DIAGNOSIS — N186 End stage renal disease: Secondary | ICD-10-CM | POA: Diagnosis not present

## 2016-08-19 DIAGNOSIS — N2581 Secondary hyperparathyroidism of renal origin: Secondary | ICD-10-CM | POA: Diagnosis not present

## 2016-08-19 DIAGNOSIS — D509 Iron deficiency anemia, unspecified: Secondary | ICD-10-CM | POA: Diagnosis not present

## 2016-08-21 DIAGNOSIS — Z23 Encounter for immunization: Secondary | ICD-10-CM | POA: Diagnosis not present

## 2016-08-21 DIAGNOSIS — N186 End stage renal disease: Secondary | ICD-10-CM | POA: Diagnosis not present

## 2016-08-21 DIAGNOSIS — D509 Iron deficiency anemia, unspecified: Secondary | ICD-10-CM | POA: Diagnosis not present

## 2016-08-21 DIAGNOSIS — N2581 Secondary hyperparathyroidism of renal origin: Secondary | ICD-10-CM | POA: Diagnosis not present

## 2016-08-22 ENCOUNTER — Encounter: Payer: Self-pay | Admitting: Family

## 2016-08-23 DIAGNOSIS — N186 End stage renal disease: Secondary | ICD-10-CM | POA: Diagnosis not present

## 2016-08-23 DIAGNOSIS — D509 Iron deficiency anemia, unspecified: Secondary | ICD-10-CM | POA: Diagnosis not present

## 2016-08-23 DIAGNOSIS — N2581 Secondary hyperparathyroidism of renal origin: Secondary | ICD-10-CM | POA: Diagnosis not present

## 2016-08-23 DIAGNOSIS — Z23 Encounter for immunization: Secondary | ICD-10-CM | POA: Diagnosis not present

## 2016-08-26 DIAGNOSIS — N186 End stage renal disease: Secondary | ICD-10-CM | POA: Diagnosis not present

## 2016-08-26 DIAGNOSIS — D509 Iron deficiency anemia, unspecified: Secondary | ICD-10-CM | POA: Diagnosis not present

## 2016-08-26 DIAGNOSIS — N2581 Secondary hyperparathyroidism of renal origin: Secondary | ICD-10-CM | POA: Diagnosis not present

## 2016-08-26 DIAGNOSIS — Z23 Encounter for immunization: Secondary | ICD-10-CM | POA: Diagnosis not present

## 2016-08-27 ENCOUNTER — Ambulatory Visit (HOSPITAL_COMMUNITY): Payer: Medicare Other | Attending: Family

## 2016-08-27 ENCOUNTER — Ambulatory Visit: Payer: Medicare Other | Admitting: Family

## 2016-08-27 ENCOUNTER — Encounter (HOSPITAL_BASED_OUTPATIENT_CLINIC_OR_DEPARTMENT_OTHER): Payer: Medicare Other

## 2016-08-28 ENCOUNTER — Ambulatory Visit (HOSPITAL_COMMUNITY)
Admission: RE | Admit: 2016-08-28 | Discharge: 2016-08-28 | Disposition: A | Discharge status: Home / Self Care | Payer: Medicare Other | Source: Ambulatory Visit | Attending: Vascular Surgery | Admitting: Vascular Surgery

## 2016-08-28 ENCOUNTER — Ambulatory Visit (INDEPENDENT_AMBULATORY_CARE_PROVIDER_SITE_OTHER): Payer: Medicare Other | Admitting: Family

## 2016-08-28 ENCOUNTER — Encounter: Payer: Self-pay | Admitting: Family

## 2016-08-28 VITALS — BP 125/81 | HR 111 | Temp 98.9°F | Resp 16 | Ht 74.0 in | Wt 181.0 lb

## 2016-08-28 DIAGNOSIS — N186 End stage renal disease: Secondary | ICD-10-CM | POA: Diagnosis not present

## 2016-08-28 DIAGNOSIS — T82898D Other specified complication of vascular prosthetic devices, implants and grafts, subsequent encounter: Secondary | ICD-10-CM | POA: Diagnosis not present

## 2016-08-28 DIAGNOSIS — I8291 Chronic embolism and thrombosis of unspecified vein: Secondary | ICD-10-CM | POA: Diagnosis not present

## 2016-08-28 DIAGNOSIS — I770 Arteriovenous fistula, acquired: Secondary | ICD-10-CM | POA: Diagnosis not present

## 2016-08-28 DIAGNOSIS — Z23 Encounter for immunization: Secondary | ICD-10-CM | POA: Diagnosis not present

## 2016-08-28 DIAGNOSIS — N2581 Secondary hyperparathyroidism of renal origin: Secondary | ICD-10-CM | POA: Diagnosis not present

## 2016-08-28 DIAGNOSIS — Z992 Dependence on renal dialysis: Secondary | ICD-10-CM

## 2016-08-28 DIAGNOSIS — D509 Iron deficiency anemia, unspecified: Secondary | ICD-10-CM | POA: Diagnosis not present
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
## 2016-08-30 ENCOUNTER — Encounter: Payer: Self-pay | Admitting: Nephrology

## 2016-08-30 DIAGNOSIS — N186 End stage renal disease: Secondary | ICD-10-CM | POA: Diagnosis not present

## 2016-08-30 DIAGNOSIS — Z23 Encounter for immunization: Secondary | ICD-10-CM | POA: Diagnosis not present

## 2016-08-30 DIAGNOSIS — D509 Iron deficiency anemia, unspecified: Secondary | ICD-10-CM | POA: Diagnosis not present

## 2016-08-30 DIAGNOSIS — N2581 Secondary hyperparathyroidism of renal origin: Secondary | ICD-10-CM | POA: Diagnosis not present

## 2016-09-02 DIAGNOSIS — N186 End stage renal disease: Secondary | ICD-10-CM | POA: Diagnosis not present

## 2016-09-02 DIAGNOSIS — N2581 Secondary hyperparathyroidism of renal origin: Secondary | ICD-10-CM | POA: Diagnosis not present

## 2016-09-02 DIAGNOSIS — Z23 Encounter for immunization: Secondary | ICD-10-CM | POA: Diagnosis not present

## 2016-09-02 DIAGNOSIS — D509 Iron deficiency anemia, unspecified: Secondary | ICD-10-CM | POA: Diagnosis not present

## 2016-09-03 ENCOUNTER — Telehealth (INDEPENDENT_AMBULATORY_CARE_PROVIDER_SITE_OTHER): Payer: Medicare Other | Admitting: Pharmacist

## 2016-09-03 DIAGNOSIS — Z7901 Long term (current) use of anticoagulants: Secondary | ICD-10-CM

## 2016-09-03 DIAGNOSIS — I2699 Other pulmonary embolism without acute cor pulmonale: Secondary | ICD-10-CM | POA: Diagnosis present

## 2016-09-03 LAB — POCT INR: INR: 2.5

## 2016-09-03 NOTE — Progress Notes (Signed)
Anticoagulation Management Brian [name] is a 40 y.o. male who reports to the clinic for monitoring of warfarin treatment.    Indication: DVT, PE history, recurrent Duration: indefinite  Anticoagulation Clinic Visit History: Patient does not report signs/symptoms of bleeding or thromboembolism. He does report sometimes bleeding from fistula, but did not happen this past hemodialysis session, occurs 2-3 times per month. Anticoagulation Episode Summary    Current INR goal:   2.0-3.0  TTR:   67.9 % (6.6 mo)  Next INR check:   10/03/2016  INR from last check:   2.5 (09/03/2016)  Weekly max dose:     Target end date:   Indefinite  INR check location:     Preferred lab:     Send INR reminders to:      Indications   Deep vein thrombosis (HCC) (Resolved) [I82.409] History of deep vein thrombosis (Resolved) [Z86.718] PULMONARY EMBOLISM (Resolved) [I26.99] PULMONARY EMBOLISM (Resolved) [I26.99]       Comments:          ASSESSMENT Recent Results: The most recent result is correlated with 45 mg per week: Lab Results  Component Value Date   INR 2.5 09/03/2016   INR 2.58 08/10/2016   INR 3.19 08/09/2016   Anticoagulation Dosing:   Anticoagulation Dose Instructions as of 09/03/2016      Total Sun Mon Tue Wed Thu Fri Sat   New Dose 45 mg 7.5 mg 5 mg 7.5 mg 5 mg 7.5 mg 5 mg 7.5 mg     (5 mg x 1.5)  (5 mg x 1)  (5 mg x 1.5)  (5 mg x 1)  (5 mg x 1.5)  (5 mg x 1)  (5 mg x 1.5)                           INR today: Therapeutic  PLAN Weekly dose was unchanged   Patient advised to contact clinic or seek medical attention if signs/symptoms of bleeding or thromboembolism occur.  Patient verbalized understanding by repeating back information and was advised to contact me if further medication-related questions arise. Patient was also provided an information handout.  Follow-up Return in about 1 month (around 10/03/2016) for Follow up INR around 10/03/16.  Kim,Jennifer J

## 2016-09-04 DIAGNOSIS — N186 End stage renal disease: Secondary | ICD-10-CM | POA: Diagnosis not present

## 2016-09-04 DIAGNOSIS — D509 Iron deficiency anemia, unspecified: Secondary | ICD-10-CM | POA: Diagnosis not present

## 2016-09-04 DIAGNOSIS — N2581 Secondary hyperparathyroidism of renal origin: Secondary | ICD-10-CM | POA: Diagnosis not present

## 2016-09-04 DIAGNOSIS — Z23 Encounter for immunization: Secondary | ICD-10-CM | POA: Diagnosis not present

## 2016-09-06 DIAGNOSIS — Z23 Encounter for immunization: Secondary | ICD-10-CM | POA: Diagnosis not present

## 2016-09-06 DIAGNOSIS — D509 Iron deficiency anemia, unspecified: Secondary | ICD-10-CM | POA: Diagnosis not present

## 2016-09-06 DIAGNOSIS — N2581 Secondary hyperparathyroidism of renal origin: Secondary | ICD-10-CM | POA: Diagnosis not present

## 2016-09-06 DIAGNOSIS — N186 End stage renal disease: Secondary | ICD-10-CM | POA: Diagnosis not present

## 2016-09-09 ENCOUNTER — Other Ambulatory Visit: Payer: Self-pay | Admitting: *Deleted

## 2016-09-09 ENCOUNTER — Other Ambulatory Visit: Payer: Self-pay | Admitting: Internal Medicine

## 2016-09-09 DIAGNOSIS — N2581 Secondary hyperparathyroidism of renal origin: Secondary | ICD-10-CM | POA: Diagnosis not present

## 2016-09-09 DIAGNOSIS — Z23 Encounter for immunization: Secondary | ICD-10-CM | POA: Diagnosis not present

## 2016-09-09 DIAGNOSIS — R630 Anorexia: Secondary | ICD-10-CM

## 2016-09-09 DIAGNOSIS — N186 End stage renal disease: Secondary | ICD-10-CM | POA: Diagnosis not present

## 2016-09-09 DIAGNOSIS — Q613 Polycystic kidney, unspecified: Secondary | ICD-10-CM

## 2016-09-09 DIAGNOSIS — D509 Iron deficiency anemia, unspecified: Secondary | ICD-10-CM | POA: Diagnosis not present

## 2016-09-09 NOTE — Telephone Encounter (Signed)
dronabinol (MARINOL) 5 MG capsule And pain medication refills

## 2016-09-09 NOTE — Telephone Encounter (Signed)
Rx last written 08/10/16. Last OV 8/25 with Dr Danford Bad; no f/u appt scheduled. UDS 07/16/16.

## 2016-09-10 ENCOUNTER — Other Ambulatory Visit: Payer: Self-pay | Admitting: Internal Medicine

## 2016-09-10 DIAGNOSIS — Z9109 Other allergy status, other than to drugs and biological substances: Secondary | ICD-10-CM

## 2016-09-10 DIAGNOSIS — B20 Human immunodeficiency virus [HIV] disease: Secondary | ICD-10-CM

## 2016-09-10 MED ORDER — LAMIVUDINE 100 MG PO TABS: ORAL_TABLET | ORAL | 5 refills | Status: DC

## 2016-09-10 MED ORDER — ABACAVIR SULFATE 300 MG PO TABS: 300.0000 mg | ORAL_TABLET | Freq: Two times a day (BID) | ORAL | 5 refills | Status: DC

## 2016-09-11 ENCOUNTER — Telehealth: Payer: Self-pay | Admitting: Internal Medicine

## 2016-09-11 DIAGNOSIS — N186 End stage renal disease: Secondary | ICD-10-CM | POA: Diagnosis not present

## 2016-09-11 DIAGNOSIS — D509 Iron deficiency anemia, unspecified: Secondary | ICD-10-CM | POA: Diagnosis not present

## 2016-09-11 DIAGNOSIS — Q613 Polycystic kidney, unspecified: Secondary | ICD-10-CM

## 2016-09-11 DIAGNOSIS — Z23 Encounter for immunization: Secondary | ICD-10-CM | POA: Diagnosis not present

## 2016-09-11 DIAGNOSIS — N2581 Secondary hyperparathyroidism of renal origin: Secondary | ICD-10-CM | POA: Diagnosis not present

## 2016-09-11 MED ORDER — OXYCODONE-ACETAMINOPHEN 10-325 MG PO TABS: 1.0000 | ORAL_TABLET | ORAL | 0 refills | Status: DC | PRN

## 2016-09-11 MED ORDER — DRONABINOL 5 MG PO CAPS: 5.0000 mg | ORAL_CAPSULE | Freq: Two times a day (BID) | ORAL | 2 refills | Status: DC

## 2016-09-11 NOTE — Telephone Encounter (Signed)
Spoke with Ulis Rias about [name]'s refills.  [place] narcotic database reviewed and appropriate refill history for last 6 months.  Gave 2 month supply with plan to get in to see Dr. Hetty Ely, his new PCP.  He will need tox screen. He has PCKD and impressive large volume kidney and liver which is likely the cause of his chronic pain.

## 2016-09-12 ENCOUNTER — Encounter: Payer: Self-pay | Admitting: Internal Medicine

## 2016-09-12 ENCOUNTER — Ambulatory Visit (INDEPENDENT_AMBULATORY_CARE_PROVIDER_SITE_OTHER): Payer: Medicare Other | Admitting: Internal Medicine

## 2016-09-12 ENCOUNTER — Ambulatory Visit (HOSPITAL_COMMUNITY)
Admission: RE | Admit: 2016-09-12 | Discharge: 2016-09-12 | Disposition: A | Discharge status: Home / Self Care | Payer: Medicare Other | Source: Ambulatory Visit | Attending: Internal Medicine | Admitting: Internal Medicine

## 2016-09-12 VITALS — BP 117/82 | HR 115 | Temp 98.2°F | Ht 74.0 in | Wt 192.8 lb

## 2016-09-12 DIAGNOSIS — G2581 Restless legs syndrome: Secondary | ICD-10-CM

## 2016-09-12 DIAGNOSIS — L03116 Cellulitis of left lower limb: Secondary | ICD-10-CM | POA: Insufficient documentation

## 2016-09-12 DIAGNOSIS — G629 Polyneuropathy, unspecified: Secondary | ICD-10-CM | POA: Diagnosis not present

## 2016-09-12 DIAGNOSIS — F17210 Nicotine dependence, cigarettes, uncomplicated: Secondary | ICD-10-CM | POA: Diagnosis not present

## 2016-09-12 DIAGNOSIS — I878 Other specified disorders of veins: Secondary | ICD-10-CM | POA: Diagnosis present

## 2016-09-12 DIAGNOSIS — L03114 Cellulitis of left upper limb: Secondary | ICD-10-CM | POA: Diagnosis not present

## 2016-09-12 DIAGNOSIS — G588 Other specified mononeuropathies: Secondary | ICD-10-CM

## 2016-09-12 DIAGNOSIS — Z21 Asymptomatic human immunodeficiency virus [HIV] infection status: Secondary | ICD-10-CM

## 2016-09-12 DIAGNOSIS — Z872 Personal history of diseases of the skin and subcutaneous tissue: Secondary | ICD-10-CM

## 2016-09-12 DIAGNOSIS — G609 Hereditary and idiopathic neuropathy, unspecified: Secondary | ICD-10-CM | POA: Insufficient documentation

## 2016-09-12 MED ORDER — GABAPENTIN 300 MG PO CAPS
300.0000 mg | ORAL_CAPSULE | Freq: Three times a day (TID) | ORAL | 2 refills | Status: DC
Start: 2016-09-12 — End: 2016-11-09

## 2016-09-12 NOTE — Progress Notes (Signed)
Internal Medicine Clinic Attending  I saw and evaluated the patient.  I personally confirmed the key portions of the history and exam documented by Dr. Strelow and I reviewed pertinent patient test results.  The assessment, diagnosis, and plan were formulated together and I agree with the documentation in the resident's note. 

## 2016-09-12 NOTE — Assessment & Plan Note (Addendum)
Patient reports a burning tingling sensation of the bilateral feet is worse at night. Patient has a history of restless leg syndrome but has never expressed any symptoms until 4 months ago. He also notes periodic sharp stabbing sensation in the bilateral lower extremities that occur throughout the day and resolves spontaneously. These episodes last only several seconds and occur several times a week. Patient is not a history of diabetes. His HIV positive. Suspect polyneuropathy and would like to trial gabapentin 300mg  TID. F/u in 2 weeks by phone to assess treatment efficacy.

## 2016-09-12 NOTE — Assessment & Plan Note (Addendum)
H/o cellulitis in LLE. No signs of active infection, but swelling and TTP w/ well healing sore over left ankle. Will get plain film to assess for bony changes. No abx presently. Anticipatory guidance given for signs of infection.  Update: Plain films negative. Patient notified. No antibiotic therapy indicated.

## 2016-09-12 NOTE — Assessment & Plan Note (Addendum)
Patient presents with typical lower extremity findings for venous stasis. His significant risk factors for venous stasis and presents with typical story. That his edema is worse in the evenings after being upright during the day. Any pain with ambulation, he denies any tenderness palpation, denies any fevers chills. Patient was recently treated for cellulitis of the left lower extremity 4 weeks ago, but notes that this fully resolved after his antibiotic therapy and has since recurred. He also has several sores throughout his bilateral lower extremity which are pruritic in nature, but not appear infected currently.  We'll recommend compression stockings and elevation. We'll continue to evaluate for worsening symptoms.

## 2016-09-12 NOTE — Patient Instructions (Signed)
I think the skin changes and swelling in your left leg are related to something called venous stasis. Primary treatment for this will be elevating your leg in the evenings as much as possible and you can wear compression stockings during the day in order to help prevent fluid from pulling in your legs.  We will get an XR of the left ankle in order to make sure there are no signs of infection, but it does not appear that there is any need for antibiotics at this point. Please let us know if you have worsening redness, pain, or swelling in any of the sores on your legs.  We will plan to follow up in two weeks to see how you are doing.
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
## 2016-09-13 DIAGNOSIS — N186 End stage renal disease: Secondary | ICD-10-CM | POA: Diagnosis not present

## 2016-09-13 DIAGNOSIS — D509 Iron deficiency anemia, unspecified: Secondary | ICD-10-CM | POA: Diagnosis not present

## 2016-09-13 DIAGNOSIS — Z23 Encounter for immunization: Secondary | ICD-10-CM | POA: Diagnosis not present

## 2016-09-13 DIAGNOSIS — N2581 Secondary hyperparathyroidism of renal origin: Secondary | ICD-10-CM | POA: Diagnosis not present

## 2016-09-14 DIAGNOSIS — N186 End stage renal disease: Secondary | ICD-10-CM | POA: Diagnosis not present

## 2016-09-14 DIAGNOSIS — I120 Hypertensive chronic kidney disease with stage 5 chronic kidney disease or end stage renal disease: Secondary | ICD-10-CM | POA: Diagnosis not present

## 2016-09-14 DIAGNOSIS — Z992 Dependence on renal dialysis: Secondary | ICD-10-CM | POA: Diagnosis not present

## 2016-09-16 DIAGNOSIS — D509 Iron deficiency anemia, unspecified: Secondary | ICD-10-CM | POA: Diagnosis not present

## 2016-09-16 DIAGNOSIS — N2581 Secondary hyperparathyroidism of renal origin: Secondary | ICD-10-CM | POA: Diagnosis not present

## 2016-09-16 DIAGNOSIS — N186 End stage renal disease: Secondary | ICD-10-CM | POA: Diagnosis not present

## 2016-09-18 DIAGNOSIS — N186 End stage renal disease: Secondary | ICD-10-CM | POA: Diagnosis not present

## 2016-09-18 DIAGNOSIS — D509 Iron deficiency anemia, unspecified: Secondary | ICD-10-CM | POA: Diagnosis not present

## 2016-09-18 DIAGNOSIS — N2581 Secondary hyperparathyroidism of renal origin: Secondary | ICD-10-CM | POA: Diagnosis not present

## 2016-09-20 DIAGNOSIS — N186 End stage renal disease: Secondary | ICD-10-CM | POA: Diagnosis not present

## 2016-09-20 DIAGNOSIS — D509 Iron deficiency anemia, unspecified: Secondary | ICD-10-CM | POA: Diagnosis not present

## 2016-09-20 DIAGNOSIS — N2581 Secondary hyperparathyroidism of renal origin: Secondary | ICD-10-CM | POA: Diagnosis not present

## 2016-09-23 ENCOUNTER — Other Ambulatory Visit: Payer: Self-pay

## 2016-09-23 DIAGNOSIS — N186 End stage renal disease: Secondary | ICD-10-CM | POA: Diagnosis not present

## 2016-09-23 DIAGNOSIS — D509 Iron deficiency anemia, unspecified: Secondary | ICD-10-CM | POA: Diagnosis not present

## 2016-09-23 DIAGNOSIS — N2581 Secondary hyperparathyroidism of renal origin: Secondary | ICD-10-CM | POA: Diagnosis not present

## 2016-09-25 DIAGNOSIS — N186 End stage renal disease: Secondary | ICD-10-CM | POA: Diagnosis not present

## 2016-09-25 DIAGNOSIS — N2581 Secondary hyperparathyroidism of renal origin: Secondary | ICD-10-CM | POA: Diagnosis not present

## 2016-09-25 DIAGNOSIS — I8291 Chronic embolism and thrombosis of unspecified vein: Secondary | ICD-10-CM | POA: Diagnosis not present

## 2016-09-25 DIAGNOSIS — D509 Iron deficiency anemia, unspecified: Secondary | ICD-10-CM | POA: Diagnosis not present

## 2016-09-27 DIAGNOSIS — D509 Iron deficiency anemia, unspecified: Secondary | ICD-10-CM | POA: Diagnosis not present

## 2016-09-27 DIAGNOSIS — N186 End stage renal disease: Secondary | ICD-10-CM | POA: Diagnosis not present

## 2016-09-27 DIAGNOSIS — N2581 Secondary hyperparathyroidism of renal origin: Secondary | ICD-10-CM | POA: Diagnosis not present

## 2016-09-30 DIAGNOSIS — N186 End stage renal disease: Secondary | ICD-10-CM | POA: Diagnosis not present

## 2016-09-30 DIAGNOSIS — N2581 Secondary hyperparathyroidism of renal origin: Secondary | ICD-10-CM | POA: Diagnosis not present

## 2016-09-30 DIAGNOSIS — D509 Iron deficiency anemia, unspecified: Secondary | ICD-10-CM | POA: Diagnosis not present

## 2016-10-04 DIAGNOSIS — N186 End stage renal disease: Secondary | ICD-10-CM | POA: Diagnosis not present

## 2016-10-04 DIAGNOSIS — N2581 Secondary hyperparathyroidism of renal origin: Secondary | ICD-10-CM | POA: Diagnosis not present

## 2016-10-04 DIAGNOSIS — D509 Iron deficiency anemia, unspecified: Secondary | ICD-10-CM | POA: Diagnosis not present

## 2016-10-07 ENCOUNTER — Telehealth: Payer: Self-pay | Admitting: *Deleted

## 2016-10-07 ENCOUNTER — Ambulatory Visit: Payer: Self-pay | Admitting: Internal Medicine

## 2016-10-07 DIAGNOSIS — N2581 Secondary hyperparathyroidism of renal origin: Secondary | ICD-10-CM | POA: Diagnosis not present

## 2016-10-07 DIAGNOSIS — D509 Iron deficiency anemia, unspecified: Secondary | ICD-10-CM | POA: Diagnosis not present

## 2016-10-07 DIAGNOSIS — N186 End stage renal disease: Secondary | ICD-10-CM | POA: Diagnosis not present

## 2016-10-07 NOTE — Telephone Encounter (Signed)
Left message for the patient to call [facility] to make new appointments for labs and MD.

## 2016-10-09 DIAGNOSIS — N186 End stage renal disease: Secondary | ICD-10-CM | POA: Diagnosis not present

## 2016-10-09 DIAGNOSIS — N2581 Secondary hyperparathyroidism of renal origin: Secondary | ICD-10-CM | POA: Diagnosis not present

## 2016-10-09 DIAGNOSIS — D509 Iron deficiency anemia, unspecified: Secondary | ICD-10-CM | POA: Diagnosis not present

## 2016-10-11 DIAGNOSIS — N186 End stage renal disease: Secondary | ICD-10-CM | POA: Diagnosis not present

## 2016-10-11 DIAGNOSIS — N2581 Secondary hyperparathyroidism of renal origin: Secondary | ICD-10-CM | POA: Diagnosis not present

## 2016-10-11 DIAGNOSIS — D509 Iron deficiency anemia, unspecified: Secondary | ICD-10-CM | POA: Diagnosis not present

## 2016-10-14 DIAGNOSIS — N2581 Secondary hyperparathyroidism of renal origin: Secondary | ICD-10-CM | POA: Diagnosis not present

## 2016-10-14 DIAGNOSIS — N186 End stage renal disease: Secondary | ICD-10-CM | POA: Diagnosis not present

## 2016-10-14 DIAGNOSIS — D509 Iron deficiency anemia, unspecified: Secondary | ICD-10-CM | POA: Diagnosis not present

## 2016-10-15 DIAGNOSIS — N186 End stage renal disease: Secondary | ICD-10-CM | POA: Diagnosis not present

## 2016-10-15 DIAGNOSIS — Z992 Dependence on renal dialysis: Secondary | ICD-10-CM | POA: Diagnosis not present

## 2016-10-15 DIAGNOSIS — I120 Hypertensive chronic kidney disease with stage 5 chronic kidney disease or end stage renal disease: Secondary | ICD-10-CM | POA: Diagnosis not present

## 2016-10-16 DIAGNOSIS — N186 End stage renal disease: Secondary | ICD-10-CM | POA: Diagnosis not present

## 2016-10-16 DIAGNOSIS — D509 Iron deficiency anemia, unspecified: Secondary | ICD-10-CM | POA: Diagnosis not present

## 2016-10-16 DIAGNOSIS — N2581 Secondary hyperparathyroidism of renal origin: Secondary | ICD-10-CM | POA: Diagnosis not present

## 2016-10-16 DIAGNOSIS — D631 Anemia in chronic kidney disease: Secondary | ICD-10-CM | POA: Diagnosis not present

## 2016-10-18 DIAGNOSIS — D631 Anemia in chronic kidney disease: Secondary | ICD-10-CM | POA: Diagnosis not present

## 2016-10-18 DIAGNOSIS — N2581 Secondary hyperparathyroidism of renal origin: Secondary | ICD-10-CM | POA: Diagnosis not present

## 2016-10-18 DIAGNOSIS — D509 Iron deficiency anemia, unspecified: Secondary | ICD-10-CM | POA: Diagnosis not present

## 2016-10-18 DIAGNOSIS — N186 End stage renal disease: Secondary | ICD-10-CM | POA: Diagnosis not present

## 2016-10-21 DIAGNOSIS — N2581 Secondary hyperparathyroidism of renal origin: Secondary | ICD-10-CM | POA: Diagnosis not present

## 2016-10-21 DIAGNOSIS — D509 Iron deficiency anemia, unspecified: Secondary | ICD-10-CM | POA: Diagnosis not present

## 2016-10-21 DIAGNOSIS — N186 End stage renal disease: Secondary | ICD-10-CM | POA: Diagnosis not present

## 2016-10-21 DIAGNOSIS — D631 Anemia in chronic kidney disease: Secondary | ICD-10-CM | POA: Diagnosis not present

## 2016-10-23 DIAGNOSIS — N2581 Secondary hyperparathyroidism of renal origin: Secondary | ICD-10-CM | POA: Diagnosis not present

## 2016-10-23 DIAGNOSIS — D509 Iron deficiency anemia, unspecified: Secondary | ICD-10-CM | POA: Diagnosis not present

## 2016-10-23 DIAGNOSIS — N186 End stage renal disease: Secondary | ICD-10-CM | POA: Diagnosis not present

## 2016-10-23 DIAGNOSIS — D631 Anemia in chronic kidney disease: Secondary | ICD-10-CM | POA: Diagnosis not present

## 2016-10-28 DIAGNOSIS — N186 End stage renal disease: Secondary | ICD-10-CM | POA: Diagnosis not present

## 2016-10-28 DIAGNOSIS — D631 Anemia in chronic kidney disease: Secondary | ICD-10-CM | POA: Diagnosis not present

## 2016-10-28 DIAGNOSIS — D509 Iron deficiency anemia, unspecified: Secondary | ICD-10-CM | POA: Diagnosis not present

## 2016-10-28 DIAGNOSIS — N2581 Secondary hyperparathyroidism of renal origin: Secondary | ICD-10-CM | POA: Diagnosis not present

## 2016-10-30 DIAGNOSIS — N186 End stage renal disease: Secondary | ICD-10-CM | POA: Diagnosis not present

## 2016-10-30 DIAGNOSIS — D509 Iron deficiency anemia, unspecified: Secondary | ICD-10-CM | POA: Diagnosis not present

## 2016-10-30 DIAGNOSIS — D631 Anemia in chronic kidney disease: Secondary | ICD-10-CM | POA: Diagnosis not present

## 2016-10-30 DIAGNOSIS — N2581 Secondary hyperparathyroidism of renal origin: Secondary | ICD-10-CM | POA: Diagnosis not present

## 2016-11-01 DIAGNOSIS — D509 Iron deficiency anemia, unspecified: Secondary | ICD-10-CM | POA: Diagnosis not present

## 2016-11-01 DIAGNOSIS — D631 Anemia in chronic kidney disease: Secondary | ICD-10-CM | POA: Diagnosis not present

## 2016-11-01 DIAGNOSIS — N2581 Secondary hyperparathyroidism of renal origin: Secondary | ICD-10-CM | POA: Diagnosis not present

## 2016-11-01 DIAGNOSIS — N186 End stage renal disease: Secondary | ICD-10-CM | POA: Diagnosis not present

## 2016-11-03 DIAGNOSIS — D631 Anemia in chronic kidney disease: Secondary | ICD-10-CM | POA: Diagnosis not present

## 2016-11-03 DIAGNOSIS — N186 End stage renal disease: Secondary | ICD-10-CM | POA: Diagnosis not present

## 2016-11-03 DIAGNOSIS — D509 Iron deficiency anemia, unspecified: Secondary | ICD-10-CM | POA: Diagnosis not present

## 2016-11-03 DIAGNOSIS — N2581 Secondary hyperparathyroidism of renal origin: Secondary | ICD-10-CM | POA: Diagnosis not present

## 2016-11-05 ENCOUNTER — Telehealth: Payer: Self-pay | Admitting: Internal Medicine

## 2016-11-05 DIAGNOSIS — N2581 Secondary hyperparathyroidism of renal origin: Secondary | ICD-10-CM | POA: Diagnosis not present

## 2016-11-05 DIAGNOSIS — N186 End stage renal disease: Secondary | ICD-10-CM | POA: Diagnosis not present

## 2016-11-05 DIAGNOSIS — D509 Iron deficiency anemia, unspecified: Secondary | ICD-10-CM | POA: Diagnosis not present

## 2016-11-05 DIAGNOSIS — D631 Anemia in chronic kidney disease: Secondary | ICD-10-CM | POA: Diagnosis not present

## 2016-11-05 NOTE — Telephone Encounter (Signed)
APT. REMINDER CALL, LMTCB °

## 2016-11-06 ENCOUNTER — Encounter: Payer: Self-pay | Admitting: Internal Medicine

## 2016-11-06 ENCOUNTER — Ambulatory Visit (INDEPENDENT_AMBULATORY_CARE_PROVIDER_SITE_OTHER): Payer: Medicare Other | Admitting: Internal Medicine

## 2016-11-06 DIAGNOSIS — F17210 Nicotine dependence, cigarettes, uncomplicated: Secondary | ICD-10-CM | POA: Diagnosis not present

## 2016-11-06 DIAGNOSIS — I5022 Chronic systolic (congestive) heart failure: Secondary | ICD-10-CM

## 2016-11-06 DIAGNOSIS — Z6823 Body mass index (BMI) 23.0-23.9, adult: Secondary | ICD-10-CM | POA: Diagnosis not present

## 2016-11-06 DIAGNOSIS — I712 Thoracic aortic aneurysm, without rupture, unspecified: Secondary | ICD-10-CM

## 2016-11-06 DIAGNOSIS — Q613 Polycystic kidney, unspecified: Secondary | ICD-10-CM | POA: Diagnosis not present

## 2016-11-06 DIAGNOSIS — N186 End stage renal disease: Secondary | ICD-10-CM

## 2016-11-06 DIAGNOSIS — I255 Ischemic cardiomyopathy: Secondary | ICD-10-CM

## 2016-11-06 DIAGNOSIS — Z86718 Personal history of other venous thrombosis and embolism: Secondary | ICD-10-CM | POA: Diagnosis not present

## 2016-11-06 DIAGNOSIS — R000 Tachycardia, unspecified: Secondary | ICD-10-CM

## 2016-11-06 DIAGNOSIS — G8929 Other chronic pain: Secondary | ICD-10-CM

## 2016-11-06 DIAGNOSIS — Z7901 Long term (current) use of anticoagulants: Secondary | ICD-10-CM

## 2016-11-06 DIAGNOSIS — Z992 Dependence on renal dialysis: Secondary | ICD-10-CM | POA: Diagnosis not present

## 2016-11-06 DIAGNOSIS — B20 Human immunodeficiency virus [HIV] disease: Secondary | ICD-10-CM

## 2016-11-06 DIAGNOSIS — Z79899 Other long term (current) drug therapy: Secondary | ICD-10-CM

## 2016-11-06 DIAGNOSIS — G63 Polyneuropathy in diseases classified elsewhere: Secondary | ICD-10-CM | POA: Diagnosis not present

## 2016-11-06 DIAGNOSIS — R630 Anorexia: Secondary | ICD-10-CM | POA: Diagnosis not present

## 2016-11-06 DIAGNOSIS — Z905 Acquired absence of kidney: Secondary | ICD-10-CM | POA: Diagnosis not present

## 2016-11-06 MED ORDER — OXYCODONE-ACETAMINOPHEN 10-325 MG PO TABS: 1.0000 | ORAL_TABLET | ORAL | 0 refills | Status: DC | PRN

## 2016-11-06 MED ORDER — DRONABINOL 5 MG PO CAPS: 5.0000 mg | ORAL_CAPSULE | Freq: Two times a day (BID) | ORAL | 2 refills | Status: DC

## 2016-11-06 MED ORDER — METOPROLOL SUCCINATE ER 25 MG PO TB24: 25.0000 mg | ORAL_TABLET | Freq: Every day | ORAL | 0 refills | Status: DC

## 2016-11-06 NOTE — Patient Instructions (Addendum)
It was a pleasure to meet you today Brian [name]!   For your fast heart rate- Start taking metoprolol xl 25 mg daily  Call the clinic and stop taking this if it makes you feel lightheaded   Schedule a follow up up appointment to see Dr. Hetty Ely in 3 months
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
## 2016-11-09 MED ORDER — GABAPENTIN 300 MG PO CAPS: 300.0000 mg | ORAL_CAPSULE | Freq: Three times a day (TID) | ORAL | 2 refills | Status: DC

## 2016-11-10 NOTE — Assessment & Plan Note (Signed)
Continues to experience decreased appetite which began when he started hemodialysis.   Refilled 3 months marinol 5 mg BID

## 2016-11-10 NOTE — Assessment & Plan Note (Addendum)
ECHO 2015 EF 85-02%, grade 1 diastolic dysfunction. Had been seen by cardiology who prescribed carvedilol 3.125 mg BID however this medication was discontinued when he began having hypotension during HD. His heart failure is likely multifactorial with some component of ischemia secondary to his chronic kidney disease being a coronary equivalent, tachycardic cardiomyopathy, and HIV viral cardiomyopathy. Today he denies leg swelling, cough, orthopnea, PND. He needs cardiology follow up.   Prescribed metoprolol XL 25 mg daily for cardiomyopathy, tachycardia, and aortic aneurysm  Referral for cardiology follow up   Addendum: Patient no-showed appointment with cardiology and referral was canceled.

## 2016-11-10 NOTE — Assessment & Plan Note (Signed)
Reports leg pain responded well to gabapentin.   Refilled Gabapentin 300 mg TID

## 2016-11-10 NOTE — Assessment & Plan Note (Addendum)
Asymptomatic. Needs repeat imaging for this which would include chest CT angiography. 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 Will avoid contrast by starting with ECHO for monitoring.   Ordered Echo  Prescribed Metoprolol XL 25 mg daily for aortic aneurysm, tachycardia, and cardiomyopathy

## 2016-11-10 NOTE — Assessment & Plan Note (Signed)
Has chronic pain related to PCKD. Is on oxycodone 10 mg q6h PRN which improves his functionality. Denies daytime somnolence. [place] controlled substance database check is appropriate.   Refilled 3 months oxycodone 10 mg q6h PRN  Will discuss weaning this medication at follow up visit  Will need blood test drug screen at follow up visit

## 2016-11-10 NOTE — Assessment & Plan Note (Deleted)
Reports he has had good compliance with dialysis. Continues to have appetite loss.   Refilled 3 months marinol 5 mg BID

## 2016-11-11 DIAGNOSIS — N2581 Secondary hyperparathyroidism of renal origin: Secondary | ICD-10-CM | POA: Diagnosis not present

## 2016-11-11 DIAGNOSIS — D509 Iron deficiency anemia, unspecified: Secondary | ICD-10-CM | POA: Diagnosis not present

## 2016-11-11 DIAGNOSIS — N186 End stage renal disease: Secondary | ICD-10-CM | POA: Diagnosis not present

## 2016-11-11 DIAGNOSIS — D631 Anemia in chronic kidney disease: Secondary | ICD-10-CM | POA: Diagnosis not present

## 2016-11-12 NOTE — Progress Notes (Signed)
I saw and evaluated the patient.  I personally confirmed the key portions of Dr. Fredrik Cove history and exam and reviewed pertinent patient test results.  The assessment, diagnosis, and plan were formulated together and I agree with the documentation in the resident's note.  Clarification: We will get a UDS rather than a blood test to screen for substances at follow-up.

## 2016-11-13 DIAGNOSIS — D509 Iron deficiency anemia, unspecified: Secondary | ICD-10-CM | POA: Diagnosis not present

## 2016-11-13 DIAGNOSIS — N2581 Secondary hyperparathyroidism of renal origin: Secondary | ICD-10-CM | POA: Diagnosis not present

## 2016-11-13 DIAGNOSIS — D631 Anemia in chronic kidney disease: Secondary | ICD-10-CM | POA: Diagnosis not present

## 2016-11-13 DIAGNOSIS — N186 End stage renal disease: Secondary | ICD-10-CM | POA: Diagnosis not present

## 2016-11-14 DIAGNOSIS — N186 End stage renal disease: Secondary | ICD-10-CM | POA: Diagnosis not present

## 2016-11-14 DIAGNOSIS — Z992 Dependence on renal dialysis: Secondary | ICD-10-CM | POA: Diagnosis not present

## 2016-11-14 DIAGNOSIS — I120 Hypertensive chronic kidney disease with stage 5 chronic kidney disease or end stage renal disease: Secondary | ICD-10-CM | POA: Diagnosis not present

## 2016-11-14 MED ORDER — WARFARIN SODIUM 5 MG PO TABS: 10.0000 mg | ORAL_TABLET | Freq: Every day | ORAL | 3 refills | Status: DC

## 2016-11-14 NOTE — Addendum Note (Signed)
Addended by: Meryl Dare on: 11/14/2016 10:01 PM   Modules accepted: Orders

## 2016-11-14 NOTE — Assessment & Plan Note (Addendum)
Last INR 08/2016 2.5 which is within goal of 2-3 however he has not been back to coumadin clinic since 05/2016.   Refilled warfarin 5 mg tablets   Spoke with Dr. Maudie Mercury, she will have follow up at coumadin clinic scheduled for him

## 2016-11-15 DIAGNOSIS — D509 Iron deficiency anemia, unspecified: Secondary | ICD-10-CM | POA: Diagnosis not present

## 2016-11-15 DIAGNOSIS — D631 Anemia in chronic kidney disease: Secondary | ICD-10-CM | POA: Diagnosis not present

## 2016-11-15 DIAGNOSIS — N2581 Secondary hyperparathyroidism of renal origin: Secondary | ICD-10-CM | POA: Diagnosis not present

## 2016-11-15 DIAGNOSIS — N186 End stage renal disease: Secondary | ICD-10-CM | POA: Diagnosis not present

## 2016-11-16 ENCOUNTER — Other Ambulatory Visit: Payer: Self-pay | Admitting: Internal Medicine

## 2016-11-16 DIAGNOSIS — J302 Other seasonal allergic rhinitis: Secondary | ICD-10-CM

## 2016-11-18 DIAGNOSIS — D509 Iron deficiency anemia, unspecified: Secondary | ICD-10-CM | POA: Diagnosis not present

## 2016-11-18 DIAGNOSIS — N2581 Secondary hyperparathyroidism of renal origin: Secondary | ICD-10-CM | POA: Diagnosis not present

## 2016-11-18 DIAGNOSIS — N186 End stage renal disease: Secondary | ICD-10-CM | POA: Diagnosis not present

## 2016-11-18 DIAGNOSIS — D631 Anemia in chronic kidney disease: Secondary | ICD-10-CM | POA: Diagnosis not present

## 2016-11-20 DIAGNOSIS — N186 End stage renal disease: Secondary | ICD-10-CM | POA: Diagnosis not present

## 2016-11-20 DIAGNOSIS — D509 Iron deficiency anemia, unspecified: Secondary | ICD-10-CM | POA: Diagnosis not present

## 2016-11-20 DIAGNOSIS — D631 Anemia in chronic kidney disease: Secondary | ICD-10-CM | POA: Diagnosis not present

## 2016-11-20 DIAGNOSIS — N2581 Secondary hyperparathyroidism of renal origin: Secondary | ICD-10-CM | POA: Diagnosis not present

## 2016-11-21 ENCOUNTER — Ambulatory Visit: Payer: Self-pay | Admitting: Pharmacist

## 2016-11-22 DIAGNOSIS — D631 Anemia in chronic kidney disease: Secondary | ICD-10-CM | POA: Diagnosis not present

## 2016-11-22 DIAGNOSIS — N2581 Secondary hyperparathyroidism of renal origin: Secondary | ICD-10-CM | POA: Diagnosis not present

## 2016-11-22 DIAGNOSIS — N186 End stage renal disease: Secondary | ICD-10-CM | POA: Diagnosis not present

## 2016-11-22 DIAGNOSIS — D509 Iron deficiency anemia, unspecified: Secondary | ICD-10-CM | POA: Diagnosis not present

## 2016-11-25 ENCOUNTER — Ambulatory Visit (INDEPENDENT_AMBULATORY_CARE_PROVIDER_SITE_OTHER): Payer: Medicare Other | Admitting: Pharmacist

## 2016-11-25 DIAGNOSIS — Z86711 Personal history of pulmonary embolism: Secondary | ICD-10-CM

## 2016-11-25 DIAGNOSIS — Z7901 Long term (current) use of anticoagulants: Secondary | ICD-10-CM | POA: Diagnosis not present

## 2016-11-25 DIAGNOSIS — N2581 Secondary hyperparathyroidism of renal origin: Secondary | ICD-10-CM | POA: Diagnosis not present

## 2016-11-25 DIAGNOSIS — Z86718 Personal history of other venous thrombosis and embolism: Secondary | ICD-10-CM

## 2016-11-25 DIAGNOSIS — D509 Iron deficiency anemia, unspecified: Secondary | ICD-10-CM | POA: Diagnosis not present

## 2016-11-25 DIAGNOSIS — N186 End stage renal disease: Secondary | ICD-10-CM | POA: Diagnosis not present

## 2016-11-25 DIAGNOSIS — D631 Anemia in chronic kidney disease: Secondary | ICD-10-CM | POA: Diagnosis not present

## 2016-11-25 LAB — POCT INR: INR: 1.2

## 2016-11-25 NOTE — Patient Instructions (Signed)
Patient instructed to take medications as defined in the Anti-coagulation Track section of this encounter.  Patient instructed to take today's dose.  Patient instructed to take 1&1/2 tablets of your peach colored 5mg  warfarin tablets--EXCEPT on Wednesdays, take 1 tablet only on Wednesdays.  Patient verbalized understanding of these instructions.

## 2016-11-25 NOTE — Progress Notes (Signed)
Anti-Coagulation Progress Note  Brian [name] is a 40 y.o. male who is currently on an anti-coagulation regimen.    RECENT RESULTS: Recent results are below, the most recent result is correlated with a dose of 0 mg. per week (states has been off of his warfarin): Lab Results  Component Value Date   INR 1.20 11/25/2016   INR 2.5 09/03/2016   INR 2.58 08/10/2016    ANTI-COAG DOSE: Anticoagulation Dose Instructions as of 11/25/2016      Dorene Grebe Tue Wed Thu Fri Sat   New Dose 7.5 mg 7.5 mg 7.5 mg 5 mg 7.5 mg 7.5 mg 7.5 mg       ANTICOAG SUMMARY: Anticoagulation Episode Summary    Current INR goal:   2.0-3.0  TTR:   59.2 % (9.4 mo)  Next INR check:   12/23/2016  INR from last check:   1.20! (11/25/2016)  Weekly max dose:     Target end date:   Indefinite  INR check location:     Preferred lab:     Send INR reminders to:      Indications   Deep vein thrombosis (HCC) (Resolved) [I82.409] History of deep vein thrombosis (Resolved) [Z86.718] PULMONARY EMBOLISM (Resolved) [I26.99] PULMONARY EMBOLISM (Resolved) [I26.99]       Comments:           ANTICOAG TODAY: Anticoagulation Summary  As of 11/25/2016   INR goal:   2.0-3.0  TTR:     Today's INR:   1.20!  Next INR check:   12/23/2016  Target end date:   Indefinite   Indications   Deep vein thrombosis (HCC) (Resolved) [I82.409] History of deep vein thrombosis (Resolved) [Z86.718] PULMONARY EMBOLISM (Resolved) [I26.99] PULMONARY EMBOLISM (Resolved) [I26.99]        Anticoagulation Episode Summary    INR check location:      Preferred lab:      Send INR reminders to:      Comments:         PATIENT INSTRUCTIONS: Patient Instructions  Patient instructed to take medications as defined in the Anti-coagulation Track section of this encounter.  Patient instructed to take today's dose.  Patient instructed to take 1&1/2 tablets of your peach colored 5mg  warfarin tablets--EXCEPT on Wednesdays, take 1 tablet only on  Wednesdays.  Patient verbalized understanding of these instructions.       FOLLOW-UP Return in 4 weeks (on 12/23/2016) for Follow up INR at [place], III Pharm.D., CACP

## 2016-11-26 ENCOUNTER — Ambulatory Visit: Payer: Self-pay | Admitting: Cardiology

## 2016-11-27 DIAGNOSIS — D509 Iron deficiency anemia, unspecified: Secondary | ICD-10-CM | POA: Diagnosis not present

## 2016-11-27 DIAGNOSIS — N2581 Secondary hyperparathyroidism of renal origin: Secondary | ICD-10-CM | POA: Diagnosis not present

## 2016-11-27 DIAGNOSIS — D631 Anemia in chronic kidney disease: Secondary | ICD-10-CM | POA: Diagnosis not present

## 2016-11-27 DIAGNOSIS — N186 End stage renal disease: Secondary | ICD-10-CM | POA: Diagnosis not present

## 2016-11-28 ENCOUNTER — Encounter: Payer: Self-pay | Admitting: Cardiology

## 2016-12-02 DIAGNOSIS — N186 End stage renal disease: Secondary | ICD-10-CM | POA: Diagnosis not present

## 2016-12-02 DIAGNOSIS — N2581 Secondary hyperparathyroidism of renal origin: Secondary | ICD-10-CM | POA: Diagnosis not present

## 2016-12-02 DIAGNOSIS — D509 Iron deficiency anemia, unspecified: Secondary | ICD-10-CM | POA: Diagnosis not present

## 2016-12-02 DIAGNOSIS — D631 Anemia in chronic kidney disease: Secondary | ICD-10-CM | POA: Diagnosis not present

## 2016-12-04 DIAGNOSIS — D631 Anemia in chronic kidney disease: Secondary | ICD-10-CM | POA: Diagnosis not present

## 2016-12-04 DIAGNOSIS — D509 Iron deficiency anemia, unspecified: Secondary | ICD-10-CM | POA: Diagnosis not present

## 2016-12-04 DIAGNOSIS — N2581 Secondary hyperparathyroidism of renal origin: Secondary | ICD-10-CM | POA: Diagnosis not present

## 2016-12-04 DIAGNOSIS — N186 End stage renal disease: Secondary | ICD-10-CM | POA: Diagnosis not present

## 2016-12-04 NOTE — Addendum Note (Signed)
Addended by: Hulan Fray on: 12/04/2016 07:11 PM   Modules accepted: Orders

## 2016-12-06 DIAGNOSIS — N186 End stage renal disease: Secondary | ICD-10-CM | POA: Diagnosis not present

## 2016-12-06 DIAGNOSIS — N2581 Secondary hyperparathyroidism of renal origin: Secondary | ICD-10-CM | POA: Diagnosis not present

## 2016-12-06 DIAGNOSIS — D631 Anemia in chronic kidney disease: Secondary | ICD-10-CM | POA: Diagnosis not present

## 2016-12-06 DIAGNOSIS — D509 Iron deficiency anemia, unspecified: Secondary | ICD-10-CM | POA: Diagnosis not present

## 2016-12-08 DIAGNOSIS — D509 Iron deficiency anemia, unspecified: Secondary | ICD-10-CM | POA: Diagnosis not present

## 2016-12-08 DIAGNOSIS — N2581 Secondary hyperparathyroidism of renal origin: Secondary | ICD-10-CM | POA: Diagnosis not present

## 2016-12-08 DIAGNOSIS — N186 End stage renal disease: Secondary | ICD-10-CM | POA: Diagnosis not present

## 2016-12-08 DIAGNOSIS — D631 Anemia in chronic kidney disease: Secondary | ICD-10-CM | POA: Diagnosis not present

## 2016-12-11 DIAGNOSIS — D509 Iron deficiency anemia, unspecified: Secondary | ICD-10-CM | POA: Diagnosis not present

## 2016-12-11 DIAGNOSIS — N2581 Secondary hyperparathyroidism of renal origin: Secondary | ICD-10-CM | POA: Diagnosis not present

## 2016-12-11 DIAGNOSIS — N186 End stage renal disease: Secondary | ICD-10-CM | POA: Diagnosis not present

## 2016-12-11 DIAGNOSIS — D631 Anemia in chronic kidney disease: Secondary | ICD-10-CM | POA: Diagnosis not present

## 2016-12-13 DIAGNOSIS — N2581 Secondary hyperparathyroidism of renal origin: Secondary | ICD-10-CM | POA: Diagnosis not present

## 2016-12-13 DIAGNOSIS — D631 Anemia in chronic kidney disease: Secondary | ICD-10-CM | POA: Diagnosis not present

## 2016-12-13 DIAGNOSIS — D509 Iron deficiency anemia, unspecified: Secondary | ICD-10-CM | POA: Diagnosis not present

## 2016-12-13 DIAGNOSIS — N186 End stage renal disease: Secondary | ICD-10-CM | POA: Diagnosis not present

## 2016-12-15 DIAGNOSIS — N186 End stage renal disease: Secondary | ICD-10-CM | POA: Diagnosis not present

## 2016-12-15 DIAGNOSIS — N2581 Secondary hyperparathyroidism of renal origin: Secondary | ICD-10-CM | POA: Diagnosis not present

## 2016-12-15 DIAGNOSIS — I120 Hypertensive chronic kidney disease with stage 5 chronic kidney disease or end stage renal disease: Secondary | ICD-10-CM | POA: Diagnosis not present

## 2016-12-15 DIAGNOSIS — Z992 Dependence on renal dialysis: Secondary | ICD-10-CM | POA: Diagnosis not present

## 2016-12-15 DIAGNOSIS — D509 Iron deficiency anemia, unspecified: Secondary | ICD-10-CM | POA: Diagnosis not present

## 2016-12-15 DIAGNOSIS — D631 Anemia in chronic kidney disease: Secondary | ICD-10-CM | POA: Diagnosis not present

## 2016-12-18 ENCOUNTER — Encounter: Payer: Self-pay | Admitting: Internal Medicine

## 2016-12-18 ENCOUNTER — Other Ambulatory Visit: Payer: Self-pay | Admitting: Internal Medicine

## 2016-12-18 DIAGNOSIS — Z79891 Long term (current) use of opiate analgesic: Secondary | ICD-10-CM

## 2016-12-18 HISTORY — DX: Long term (current) use of opiate analgesic: Z79.891

## 2016-12-20 DIAGNOSIS — D696 Thrombocytopenia, unspecified: Secondary | ICD-10-CM | POA: Diagnosis not present

## 2016-12-20 DIAGNOSIS — R7989 Other specified abnormal findings of blood chemistry: Secondary | ICD-10-CM | POA: Diagnosis not present

## 2016-12-20 DIAGNOSIS — E875 Hyperkalemia: Secondary | ICD-10-CM | POA: Diagnosis not present

## 2016-12-20 DIAGNOSIS — N186 End stage renal disease: Secondary | ICD-10-CM | POA: Diagnosis not present

## 2016-12-20 DIAGNOSIS — D509 Iron deficiency anemia, unspecified: Secondary | ICD-10-CM | POA: Diagnosis not present

## 2016-12-20 DIAGNOSIS — N2581 Secondary hyperparathyroidism of renal origin: Secondary | ICD-10-CM | POA: Diagnosis not present

## 2016-12-23 ENCOUNTER — Ambulatory Visit: Payer: Self-pay

## 2016-12-25 DIAGNOSIS — D509 Iron deficiency anemia, unspecified: Secondary | ICD-10-CM | POA: Diagnosis not present

## 2016-12-25 DIAGNOSIS — D696 Thrombocytopenia, unspecified: Secondary | ICD-10-CM | POA: Diagnosis not present

## 2016-12-25 DIAGNOSIS — E875 Hyperkalemia: Secondary | ICD-10-CM | POA: Diagnosis not present

## 2016-12-25 DIAGNOSIS — R7989 Other specified abnormal findings of blood chemistry: Secondary | ICD-10-CM | POA: Diagnosis not present

## 2016-12-25 DIAGNOSIS — N2581 Secondary hyperparathyroidism of renal origin: Secondary | ICD-10-CM | POA: Diagnosis not present

## 2016-12-25 DIAGNOSIS — N186 End stage renal disease: Secondary | ICD-10-CM | POA: Diagnosis not present

## 2016-12-27 DIAGNOSIS — R7989 Other specified abnormal findings of blood chemistry: Secondary | ICD-10-CM | POA: Diagnosis not present

## 2016-12-27 DIAGNOSIS — E875 Hyperkalemia: Secondary | ICD-10-CM | POA: Diagnosis not present

## 2016-12-27 DIAGNOSIS — D696 Thrombocytopenia, unspecified: Secondary | ICD-10-CM | POA: Diagnosis not present

## 2016-12-27 DIAGNOSIS — D509 Iron deficiency anemia, unspecified: Secondary | ICD-10-CM | POA: Diagnosis not present

## 2016-12-27 DIAGNOSIS — N186 End stage renal disease: Secondary | ICD-10-CM | POA: Diagnosis not present

## 2016-12-27 DIAGNOSIS — N2581 Secondary hyperparathyroidism of renal origin: Secondary | ICD-10-CM | POA: Diagnosis not present

## 2016-12-30 DIAGNOSIS — N2581 Secondary hyperparathyroidism of renal origin: Secondary | ICD-10-CM | POA: Diagnosis not present

## 2016-12-30 DIAGNOSIS — E875 Hyperkalemia: Secondary | ICD-10-CM | POA: Diagnosis not present

## 2016-12-30 DIAGNOSIS — N186 End stage renal disease: Secondary | ICD-10-CM | POA: Diagnosis not present

## 2016-12-30 DIAGNOSIS — D509 Iron deficiency anemia, unspecified: Secondary | ICD-10-CM | POA: Diagnosis not present

## 2016-12-30 DIAGNOSIS — D696 Thrombocytopenia, unspecified: Secondary | ICD-10-CM | POA: Diagnosis not present

## 2016-12-30 DIAGNOSIS — R7989 Other specified abnormal findings of blood chemistry: Secondary | ICD-10-CM | POA: Diagnosis not present

## 2017-01-01 DIAGNOSIS — R7989 Other specified abnormal findings of blood chemistry: Secondary | ICD-10-CM | POA: Diagnosis not present

## 2017-01-01 DIAGNOSIS — E875 Hyperkalemia: Secondary | ICD-10-CM | POA: Diagnosis not present

## 2017-01-01 DIAGNOSIS — N2581 Secondary hyperparathyroidism of renal origin: Secondary | ICD-10-CM | POA: Diagnosis not present

## 2017-01-01 DIAGNOSIS — D696 Thrombocytopenia, unspecified: Secondary | ICD-10-CM | POA: Diagnosis not present

## 2017-01-01 DIAGNOSIS — N186 End stage renal disease: Secondary | ICD-10-CM | POA: Diagnosis not present

## 2017-01-01 DIAGNOSIS — D509 Iron deficiency anemia, unspecified: Secondary | ICD-10-CM | POA: Diagnosis not present

## 2017-01-06 DIAGNOSIS — N186 End stage renal disease: Secondary | ICD-10-CM | POA: Diagnosis not present

## 2017-01-06 DIAGNOSIS — D696 Thrombocytopenia, unspecified: Secondary | ICD-10-CM | POA: Diagnosis not present

## 2017-01-06 DIAGNOSIS — R7989 Other specified abnormal findings of blood chemistry: Secondary | ICD-10-CM | POA: Diagnosis not present

## 2017-01-06 DIAGNOSIS — D509 Iron deficiency anemia, unspecified: Secondary | ICD-10-CM | POA: Diagnosis not present

## 2017-01-06 DIAGNOSIS — N2581 Secondary hyperparathyroidism of renal origin: Secondary | ICD-10-CM | POA: Diagnosis not present

## 2017-01-06 DIAGNOSIS — E875 Hyperkalemia: Secondary | ICD-10-CM | POA: Diagnosis not present

## 2017-01-08 DIAGNOSIS — R7989 Other specified abnormal findings of blood chemistry: Secondary | ICD-10-CM | POA: Diagnosis not present

## 2017-01-08 DIAGNOSIS — D696 Thrombocytopenia, unspecified: Secondary | ICD-10-CM | POA: Diagnosis not present

## 2017-01-08 DIAGNOSIS — E875 Hyperkalemia: Secondary | ICD-10-CM | POA: Diagnosis not present

## 2017-01-08 DIAGNOSIS — D509 Iron deficiency anemia, unspecified: Secondary | ICD-10-CM | POA: Diagnosis not present

## 2017-01-08 DIAGNOSIS — N186 End stage renal disease: Secondary | ICD-10-CM | POA: Diagnosis not present

## 2017-01-08 DIAGNOSIS — N2581 Secondary hyperparathyroidism of renal origin: Secondary | ICD-10-CM | POA: Diagnosis not present

## 2017-01-10 DIAGNOSIS — N186 End stage renal disease: Secondary | ICD-10-CM | POA: Diagnosis not present

## 2017-01-10 DIAGNOSIS — R7989 Other specified abnormal findings of blood chemistry: Secondary | ICD-10-CM | POA: Diagnosis not present

## 2017-01-10 DIAGNOSIS — N2581 Secondary hyperparathyroidism of renal origin: Secondary | ICD-10-CM | POA: Diagnosis not present

## 2017-01-10 DIAGNOSIS — D696 Thrombocytopenia, unspecified: Secondary | ICD-10-CM | POA: Diagnosis not present

## 2017-01-10 DIAGNOSIS — D509 Iron deficiency anemia, unspecified: Secondary | ICD-10-CM | POA: Diagnosis not present

## 2017-01-10 DIAGNOSIS — E875 Hyperkalemia: Secondary | ICD-10-CM | POA: Diagnosis not present

## 2017-01-13 DIAGNOSIS — D696 Thrombocytopenia, unspecified: Secondary | ICD-10-CM | POA: Diagnosis not present

## 2017-01-13 DIAGNOSIS — N186 End stage renal disease: Secondary | ICD-10-CM | POA: Diagnosis not present

## 2017-01-13 DIAGNOSIS — E875 Hyperkalemia: Secondary | ICD-10-CM | POA: Diagnosis not present

## 2017-01-13 DIAGNOSIS — N2581 Secondary hyperparathyroidism of renal origin: Secondary | ICD-10-CM | POA: Diagnosis not present

## 2017-01-13 DIAGNOSIS — R7989 Other specified abnormal findings of blood chemistry: Secondary | ICD-10-CM | POA: Diagnosis not present

## 2017-01-13 DIAGNOSIS — D509 Iron deficiency anemia, unspecified: Secondary | ICD-10-CM | POA: Diagnosis not present

## 2017-01-15 ENCOUNTER — Telehealth: Payer: Self-pay | Admitting: *Deleted

## 2017-01-15 DIAGNOSIS — I120 Hypertensive chronic kidney disease with stage 5 chronic kidney disease or end stage renal disease: Secondary | ICD-10-CM | POA: Diagnosis not present

## 2017-01-15 DIAGNOSIS — N186 End stage renal disease: Secondary | ICD-10-CM | POA: Diagnosis not present

## 2017-01-15 DIAGNOSIS — Z992 Dependence on renal dialysis: Secondary | ICD-10-CM | POA: Diagnosis not present

## 2017-01-15 NOTE — Telephone Encounter (Signed)
Call to Medicare for Prior Approval for  Dronabinol 5 mg capsules .  PA approved 01/15/2017 thru 12/15/2017.  Call to [facility] to inform Pharmacy of approval.  Sander Nephew, RN 01/15/2017 9:26 AM

## 2017-01-17 DIAGNOSIS — D631 Anemia in chronic kidney disease: Secondary | ICD-10-CM | POA: Diagnosis not present

## 2017-01-17 DIAGNOSIS — D509 Iron deficiency anemia, unspecified: Secondary | ICD-10-CM | POA: Diagnosis not present

## 2017-01-17 DIAGNOSIS — N2581 Secondary hyperparathyroidism of renal origin: Secondary | ICD-10-CM | POA: Diagnosis not present

## 2017-01-17 DIAGNOSIS — N186 End stage renal disease: Secondary | ICD-10-CM | POA: Diagnosis not present

## 2017-01-20 DIAGNOSIS — N2581 Secondary hyperparathyroidism of renal origin: Secondary | ICD-10-CM | POA: Diagnosis not present

## 2017-01-20 DIAGNOSIS — D509 Iron deficiency anemia, unspecified: Secondary | ICD-10-CM | POA: Diagnosis not present

## 2017-01-20 DIAGNOSIS — D631 Anemia in chronic kidney disease: Secondary | ICD-10-CM | POA: Diagnosis not present

## 2017-01-20 DIAGNOSIS — N186 End stage renal disease: Secondary | ICD-10-CM | POA: Diagnosis not present

## 2017-01-22 DIAGNOSIS — N186 End stage renal disease: Secondary | ICD-10-CM | POA: Diagnosis not present

## 2017-01-22 DIAGNOSIS — D509 Iron deficiency anemia, unspecified: Secondary | ICD-10-CM | POA: Diagnosis not present

## 2017-01-22 DIAGNOSIS — D631 Anemia in chronic kidney disease: Secondary | ICD-10-CM | POA: Diagnosis not present

## 2017-01-22 DIAGNOSIS — N2581 Secondary hyperparathyroidism of renal origin: Secondary | ICD-10-CM | POA: Diagnosis not present

## 2017-01-27 DIAGNOSIS — D631 Anemia in chronic kidney disease: Secondary | ICD-10-CM | POA: Diagnosis not present

## 2017-01-27 DIAGNOSIS — N2581 Secondary hyperparathyroidism of renal origin: Secondary | ICD-10-CM | POA: Diagnosis not present

## 2017-01-27 DIAGNOSIS — N186 End stage renal disease: Secondary | ICD-10-CM | POA: Diagnosis not present

## 2017-01-27 DIAGNOSIS — D509 Iron deficiency anemia, unspecified: Secondary | ICD-10-CM | POA: Diagnosis not present

## 2017-01-29 DIAGNOSIS — N186 End stage renal disease: Secondary | ICD-10-CM | POA: Diagnosis not present

## 2017-01-29 DIAGNOSIS — D631 Anemia in chronic kidney disease: Secondary | ICD-10-CM | POA: Diagnosis not present

## 2017-01-29 DIAGNOSIS — D509 Iron deficiency anemia, unspecified: Secondary | ICD-10-CM | POA: Diagnosis not present

## 2017-01-29 DIAGNOSIS — N2581 Secondary hyperparathyroidism of renal origin: Secondary | ICD-10-CM | POA: Diagnosis not present

## 2017-01-31 DIAGNOSIS — N2581 Secondary hyperparathyroidism of renal origin: Secondary | ICD-10-CM | POA: Diagnosis not present

## 2017-01-31 DIAGNOSIS — D509 Iron deficiency anemia, unspecified: Secondary | ICD-10-CM | POA: Diagnosis not present

## 2017-01-31 DIAGNOSIS — N186 End stage renal disease: Secondary | ICD-10-CM | POA: Diagnosis not present

## 2017-01-31 DIAGNOSIS — D631 Anemia in chronic kidney disease: Secondary | ICD-10-CM | POA: Diagnosis not present

## 2017-02-03 DIAGNOSIS — N2581 Secondary hyperparathyroidism of renal origin: Secondary | ICD-10-CM | POA: Diagnosis not present

## 2017-02-03 DIAGNOSIS — D631 Anemia in chronic kidney disease: Secondary | ICD-10-CM | POA: Diagnosis not present

## 2017-02-03 DIAGNOSIS — N186 End stage renal disease: Secondary | ICD-10-CM | POA: Diagnosis not present

## 2017-02-03 DIAGNOSIS — D509 Iron deficiency anemia, unspecified: Secondary | ICD-10-CM | POA: Diagnosis not present

## 2017-02-04 ENCOUNTER — Encounter: Payer: Self-pay | Admitting: Internal Medicine

## 2017-02-04 ENCOUNTER — Ambulatory Visit (INDEPENDENT_AMBULATORY_CARE_PROVIDER_SITE_OTHER): Payer: Medicare Other | Admitting: Internal Medicine

## 2017-02-04 VITALS — BP 121/80 | HR 93 | Temp 98.9°F | Wt 195.5 lb

## 2017-02-04 DIAGNOSIS — M62830 Muscle spasm of back: Secondary | ICD-10-CM

## 2017-02-04 DIAGNOSIS — Q613 Polycystic kidney, unspecified: Secondary | ICD-10-CM

## 2017-02-04 DIAGNOSIS — R630 Anorexia: Secondary | ICD-10-CM

## 2017-02-04 DIAGNOSIS — Z79891 Long term (current) use of opiate analgesic: Secondary | ICD-10-CM | POA: Diagnosis present

## 2017-02-04 DIAGNOSIS — Z86718 Personal history of other venous thrombosis and embolism: Secondary | ICD-10-CM | POA: Diagnosis not present

## 2017-02-04 DIAGNOSIS — I5022 Chronic systolic (congestive) heart failure: Secondary | ICD-10-CM

## 2017-02-04 DIAGNOSIS — Z992 Dependence on renal dialysis: Secondary | ICD-10-CM

## 2017-02-04 DIAGNOSIS — G8929 Other chronic pain: Secondary | ICD-10-CM

## 2017-02-04 DIAGNOSIS — F17210 Nicotine dependence, cigarettes, uncomplicated: Secondary | ICD-10-CM | POA: Diagnosis not present

## 2017-02-04 DIAGNOSIS — I5042 Chronic combined systolic (congestive) and diastolic (congestive) heart failure: Secondary | ICD-10-CM

## 2017-02-04 DIAGNOSIS — M533 Sacrococcygeal disorders, not elsewhere classified: Secondary | ICD-10-CM

## 2017-02-04 DIAGNOSIS — F1120 Opioid dependence, uncomplicated: Secondary | ICD-10-CM

## 2017-02-04 MED ORDER — OXYCODONE-ACETAMINOPHEN 10-325 MG PO TABS: 1.0000 | ORAL_TABLET | ORAL | 0 refills | Status: DC | PRN

## 2017-02-04 MED ORDER — DRONABINOL 5 MG PO CAPS: 5.0000 mg | ORAL_CAPSULE | Freq: Two times a day (BID) | ORAL | 2 refills | Status: DC

## 2017-02-04 NOTE — Patient Instructions (Signed)
It was a pleasure to see you today Brian [name],   Try to cut back on the cigarettes!   Please schedule a follow up appointment for 3 months
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
## 2017-02-06 NOTE — Addendum Note (Signed)
Addended by: Meryl Dare on: 02/06/2017 02:36 PM   Modules accepted: Orders

## 2017-02-06 NOTE — Progress Notes (Signed)
Internal Medicine Clinic Attending  Case discussed with Dr. Blum at the time of the visit.  We reviewed the resident's history and exam and pertinent patient test results.  I agree with the assessment, diagnosis, and plan of care documented in the resident's note. 

## 2017-02-10 DIAGNOSIS — D631 Anemia in chronic kidney disease: Secondary | ICD-10-CM | POA: Diagnosis not present

## 2017-02-10 DIAGNOSIS — M62830 Muscle spasm of back: Secondary | ICD-10-CM | POA: Insufficient documentation

## 2017-02-10 DIAGNOSIS — D509 Iron deficiency anemia, unspecified: Secondary | ICD-10-CM | POA: Diagnosis not present

## 2017-02-10 DIAGNOSIS — N2581 Secondary hyperparathyroidism of renal origin: Secondary | ICD-10-CM | POA: Diagnosis not present

## 2017-02-10 DIAGNOSIS — N186 End stage renal disease: Secondary | ICD-10-CM | POA: Diagnosis not present

## 2017-02-12 DIAGNOSIS — N186 End stage renal disease: Secondary | ICD-10-CM | POA: Diagnosis not present

## 2017-02-12 DIAGNOSIS — N2581 Secondary hyperparathyroidism of renal origin: Secondary | ICD-10-CM | POA: Diagnosis not present

## 2017-02-12 DIAGNOSIS — D631 Anemia in chronic kidney disease: Secondary | ICD-10-CM | POA: Diagnosis not present

## 2017-02-12 DIAGNOSIS — I120 Hypertensive chronic kidney disease with stage 5 chronic kidney disease or end stage renal disease: Secondary | ICD-10-CM | POA: Diagnosis not present

## 2017-02-12 DIAGNOSIS — D509 Iron deficiency anemia, unspecified: Secondary | ICD-10-CM | POA: Diagnosis not present

## 2017-02-12 DIAGNOSIS — Z992 Dependence on renal dialysis: Secondary | ICD-10-CM | POA: Diagnosis not present

## 2017-02-13 LAB — OXYCODONES,MS,WB/SP RFX
OXYCOCONE: 1.4 ng/mL
OXYCODONES CONFIRMATION: POSITIVE
Oxymorphone: NEGATIVE ng/mL

## 2017-02-13 LAB — DRUG SCREEN 10 W/CONF, WB
Amphetamines, IA: NEGATIVE ng/mL
Barbiturates, IA: NEGATIVE ug/mL
Benzodiazepines, IA: NEGATIVE ng/mL
COCAINE/METABOLITE,IA: NEGATIVE ng/mL
Methadone, IA: NEGATIVE ng/mL
OPIATES, IA: NEGATIVE ng/mL
OXYCODONES, IA: POSITIVE ng/mL — AB
Phencyclidine, IA: NEGATIVE ng/mL
Propoxyphene, IA: NEGATIVE ng/mL
THC (MARIJUANA) MTB,IA: POSITIVE ng/mL — AB

## 2017-02-13 LAB — THC,MS,WB/SP RFX
CANNABIDIOL: NEGATIVE ng/mL
CANNABINOL: NEGATIVE ng/mL
CARBOXY-THC: 9.6 ng/mL
Cannabinoid Confirmation: POSITIVE
Hydroxy-THC: NEGATIVE ng/mL
Tetrahydrocannabinol(THC): 4.1 ng/mL

## 2017-02-14 DIAGNOSIS — D631 Anemia in chronic kidney disease: Secondary | ICD-10-CM | POA: Diagnosis not present

## 2017-02-14 DIAGNOSIS — D509 Iron deficiency anemia, unspecified: Secondary | ICD-10-CM | POA: Diagnosis not present

## 2017-02-14 DIAGNOSIS — N186 End stage renal disease: Secondary | ICD-10-CM | POA: Diagnosis not present

## 2017-02-14 DIAGNOSIS — N2581 Secondary hyperparathyroidism of renal origin: Secondary | ICD-10-CM | POA: Diagnosis not present

## 2017-02-18 ENCOUNTER — Encounter: Payer: Self-pay | Admitting: Internal Medicine

## 2017-02-19 DIAGNOSIS — D509 Iron deficiency anemia, unspecified: Secondary | ICD-10-CM | POA: Diagnosis not present

## 2017-02-19 DIAGNOSIS — D631 Anemia in chronic kidney disease: Secondary | ICD-10-CM | POA: Diagnosis not present

## 2017-02-19 DIAGNOSIS — N186 End stage renal disease: Secondary | ICD-10-CM | POA: Diagnosis not present

## 2017-02-19 DIAGNOSIS — N2581 Secondary hyperparathyroidism of renal origin: Secondary | ICD-10-CM | POA: Diagnosis not present

## 2017-02-24 DIAGNOSIS — D631 Anemia in chronic kidney disease: Secondary | ICD-10-CM | POA: Diagnosis not present

## 2017-02-24 DIAGNOSIS — D509 Iron deficiency anemia, unspecified: Secondary | ICD-10-CM | POA: Diagnosis not present

## 2017-02-24 DIAGNOSIS — N2581 Secondary hyperparathyroidism of renal origin: Secondary | ICD-10-CM | POA: Diagnosis not present

## 2017-02-24 DIAGNOSIS — N186 End stage renal disease: Secondary | ICD-10-CM | POA: Diagnosis not present

## 2017-02-26 DIAGNOSIS — N2581 Secondary hyperparathyroidism of renal origin: Secondary | ICD-10-CM | POA: Diagnosis not present

## 2017-02-26 DIAGNOSIS — D631 Anemia in chronic kidney disease: Secondary | ICD-10-CM | POA: Diagnosis not present

## 2017-02-26 DIAGNOSIS — D509 Iron deficiency anemia, unspecified: Secondary | ICD-10-CM | POA: Diagnosis not present

## 2017-02-26 DIAGNOSIS — N186 End stage renal disease: Secondary | ICD-10-CM | POA: Diagnosis not present

## 2017-02-27 ENCOUNTER — Encounter: Payer: Self-pay | Admitting: Cardiology

## 2017-02-28 ENCOUNTER — Other Ambulatory Visit: Payer: Self-pay | Admitting: *Deleted

## 2017-02-28 ENCOUNTER — Other Ambulatory Visit: Payer: Self-pay | Admitting: Internal Medicine

## 2017-02-28 DIAGNOSIS — D631 Anemia in chronic kidney disease: Secondary | ICD-10-CM | POA: Diagnosis not present

## 2017-02-28 DIAGNOSIS — B20 Human immunodeficiency virus [HIV] disease: Secondary | ICD-10-CM

## 2017-02-28 DIAGNOSIS — N186 End stage renal disease: Secondary | ICD-10-CM | POA: Diagnosis not present

## 2017-02-28 DIAGNOSIS — D509 Iron deficiency anemia, unspecified: Secondary | ICD-10-CM | POA: Diagnosis not present

## 2017-02-28 DIAGNOSIS — N2581 Secondary hyperparathyroidism of renal origin: Secondary | ICD-10-CM | POA: Diagnosis not present

## 2017-02-28 MED ORDER — LAMIVUDINE 100 MG PO TABS: ORAL_TABLET | ORAL | 1 refills | Status: DC

## 2017-03-03 DIAGNOSIS — N2581 Secondary hyperparathyroidism of renal origin: Secondary | ICD-10-CM | POA: Diagnosis not present

## 2017-03-03 DIAGNOSIS — N186 End stage renal disease: Secondary | ICD-10-CM | POA: Diagnosis not present

## 2017-03-03 DIAGNOSIS — D509 Iron deficiency anemia, unspecified: Secondary | ICD-10-CM | POA: Diagnosis not present

## 2017-03-03 DIAGNOSIS — D631 Anemia in chronic kidney disease: Secondary | ICD-10-CM | POA: Diagnosis not present

## 2017-03-05 DIAGNOSIS — D631 Anemia in chronic kidney disease: Secondary | ICD-10-CM | POA: Diagnosis not present

## 2017-03-05 DIAGNOSIS — D509 Iron deficiency anemia, unspecified: Secondary | ICD-10-CM | POA: Diagnosis not present

## 2017-03-05 DIAGNOSIS — N186 End stage renal disease: Secondary | ICD-10-CM | POA: Diagnosis not present

## 2017-03-05 DIAGNOSIS — N2581 Secondary hyperparathyroidism of renal origin: Secondary | ICD-10-CM | POA: Diagnosis not present

## 2017-03-06 NOTE — Addendum Note (Signed)
Addended by: Meryl Dare on: 03/06/2017 05:20 PM   Modules accepted: Orders

## 2017-03-07 DIAGNOSIS — N2581 Secondary hyperparathyroidism of renal origin: Secondary | ICD-10-CM | POA: Diagnosis not present

## 2017-03-07 DIAGNOSIS — D509 Iron deficiency anemia, unspecified: Secondary | ICD-10-CM | POA: Diagnosis not present

## 2017-03-07 DIAGNOSIS — N186 End stage renal disease: Secondary | ICD-10-CM | POA: Diagnosis not present

## 2017-03-07 DIAGNOSIS — D631 Anemia in chronic kidney disease: Secondary | ICD-10-CM | POA: Diagnosis not present

## 2017-03-11 ENCOUNTER — Ambulatory Visit: Payer: Self-pay | Admitting: Cardiology

## 2017-03-12 DIAGNOSIS — D631 Anemia in chronic kidney disease: Secondary | ICD-10-CM | POA: Diagnosis not present

## 2017-03-12 DIAGNOSIS — N186 End stage renal disease: Secondary | ICD-10-CM | POA: Diagnosis not present

## 2017-03-12 DIAGNOSIS — N2581 Secondary hyperparathyroidism of renal origin: Secondary | ICD-10-CM | POA: Diagnosis not present

## 2017-03-12 DIAGNOSIS — D509 Iron deficiency anemia, unspecified: Secondary | ICD-10-CM | POA: Diagnosis not present

## 2017-03-14 DIAGNOSIS — D509 Iron deficiency anemia, unspecified: Secondary | ICD-10-CM | POA: Diagnosis not present

## 2017-03-14 DIAGNOSIS — N2581 Secondary hyperparathyroidism of renal origin: Secondary | ICD-10-CM | POA: Diagnosis not present

## 2017-03-14 DIAGNOSIS — N186 End stage renal disease: Secondary | ICD-10-CM | POA: Diagnosis not present

## 2017-03-14 DIAGNOSIS — D631 Anemia in chronic kidney disease: Secondary | ICD-10-CM | POA: Diagnosis not present

## 2017-03-15 DIAGNOSIS — Z992 Dependence on renal dialysis: Secondary | ICD-10-CM | POA: Diagnosis not present

## 2017-03-15 DIAGNOSIS — N186 End stage renal disease: Secondary | ICD-10-CM | POA: Diagnosis not present

## 2017-03-15 DIAGNOSIS — I120 Hypertensive chronic kidney disease with stage 5 chronic kidney disease or end stage renal disease: Secondary | ICD-10-CM | POA: Diagnosis not present

## 2017-03-17 DIAGNOSIS — N2581 Secondary hyperparathyroidism of renal origin: Secondary | ICD-10-CM | POA: Diagnosis not present

## 2017-03-17 DIAGNOSIS — D509 Iron deficiency anemia, unspecified: Secondary | ICD-10-CM | POA: Diagnosis not present

## 2017-03-17 DIAGNOSIS — N186 End stage renal disease: Secondary | ICD-10-CM | POA: Diagnosis not present

## 2017-03-17 DIAGNOSIS — D631 Anemia in chronic kidney disease: Secondary | ICD-10-CM | POA: Diagnosis not present

## 2017-03-17 NOTE — Addendum Note (Signed)
Addended by: Hulan Fray on: 03/17/2017 06:26 PM   Modules accepted: Orders

## 2017-03-19 ENCOUNTER — Encounter: Payer: Self-pay | Admitting: Cardiology

## 2017-03-20 ENCOUNTER — Ambulatory Visit: Payer: Medicare Other | Attending: Internal Medicine | Admitting: Physical Therapy

## 2017-03-21 DIAGNOSIS — D631 Anemia in chronic kidney disease: Secondary | ICD-10-CM | POA: Diagnosis not present

## 2017-03-21 DIAGNOSIS — N186 End stage renal disease: Secondary | ICD-10-CM | POA: Diagnosis not present

## 2017-03-21 DIAGNOSIS — N2581 Secondary hyperparathyroidism of renal origin: Secondary | ICD-10-CM | POA: Diagnosis not present

## 2017-03-21 DIAGNOSIS — D509 Iron deficiency anemia, unspecified: Secondary | ICD-10-CM | POA: Diagnosis not present

## 2017-03-24 DIAGNOSIS — D509 Iron deficiency anemia, unspecified: Secondary | ICD-10-CM | POA: Diagnosis not present

## 2017-03-24 DIAGNOSIS — D631 Anemia in chronic kidney disease: Secondary | ICD-10-CM | POA: Diagnosis not present

## 2017-03-24 DIAGNOSIS — N186 End stage renal disease: Secondary | ICD-10-CM | POA: Diagnosis not present

## 2017-03-24 DIAGNOSIS — N2581 Secondary hyperparathyroidism of renal origin: Secondary | ICD-10-CM | POA: Diagnosis not present

## 2017-03-24 NOTE — Addendum Note (Signed)
Addended by: Hulan Fray on: 03/24/2017 08:15 PM   Modules accepted: Orders

## 2017-03-28 DIAGNOSIS — D509 Iron deficiency anemia, unspecified: Secondary | ICD-10-CM | POA: Diagnosis not present

## 2017-03-28 DIAGNOSIS — D631 Anemia in chronic kidney disease: Secondary | ICD-10-CM | POA: Diagnosis not present

## 2017-03-28 DIAGNOSIS — N186 End stage renal disease: Secondary | ICD-10-CM | POA: Diagnosis not present

## 2017-03-28 DIAGNOSIS — N2581 Secondary hyperparathyroidism of renal origin: Secondary | ICD-10-CM | POA: Diagnosis not present

## 2017-03-31 ENCOUNTER — Other Ambulatory Visit: Payer: Self-pay | Admitting: Internal Medicine

## 2017-03-31 DIAGNOSIS — D509 Iron deficiency anemia, unspecified: Secondary | ICD-10-CM | POA: Diagnosis not present

## 2017-03-31 DIAGNOSIS — D631 Anemia in chronic kidney disease: Secondary | ICD-10-CM | POA: Diagnosis not present

## 2017-03-31 DIAGNOSIS — N186 End stage renal disease: Secondary | ICD-10-CM | POA: Diagnosis not present

## 2017-03-31 DIAGNOSIS — N2581 Secondary hyperparathyroidism of renal origin: Secondary | ICD-10-CM | POA: Diagnosis not present

## 2017-04-02 ENCOUNTER — Other Ambulatory Visit: Payer: Self-pay | Admitting: Internal Medicine

## 2017-04-02 DIAGNOSIS — N2581 Secondary hyperparathyroidism of renal origin: Secondary | ICD-10-CM | POA: Diagnosis not present

## 2017-04-02 DIAGNOSIS — N186 End stage renal disease: Secondary | ICD-10-CM | POA: Diagnosis not present

## 2017-04-02 DIAGNOSIS — D631 Anemia in chronic kidney disease: Secondary | ICD-10-CM | POA: Diagnosis not present

## 2017-04-02 DIAGNOSIS — D509 Iron deficiency anemia, unspecified: Secondary | ICD-10-CM | POA: Diagnosis not present

## 2017-04-02 DIAGNOSIS — B20 Human immunodeficiency virus [HIV] disease: Secondary | ICD-10-CM

## 2017-04-05 DIAGNOSIS — D509 Iron deficiency anemia, unspecified: Secondary | ICD-10-CM | POA: Diagnosis not present

## 2017-04-05 DIAGNOSIS — N2581 Secondary hyperparathyroidism of renal origin: Secondary | ICD-10-CM | POA: Diagnosis not present

## 2017-04-05 DIAGNOSIS — N186 End stage renal disease: Secondary | ICD-10-CM | POA: Diagnosis not present

## 2017-04-05 DIAGNOSIS — D631 Anemia in chronic kidney disease: Secondary | ICD-10-CM | POA: Diagnosis not present

## 2017-04-07 DIAGNOSIS — N2581 Secondary hyperparathyroidism of renal origin: Secondary | ICD-10-CM | POA: Diagnosis not present

## 2017-04-07 DIAGNOSIS — D631 Anemia in chronic kidney disease: Secondary | ICD-10-CM | POA: Diagnosis not present

## 2017-04-07 DIAGNOSIS — D509 Iron deficiency anemia, unspecified: Secondary | ICD-10-CM | POA: Diagnosis not present

## 2017-04-07 DIAGNOSIS — N186 End stage renal disease: Secondary | ICD-10-CM | POA: Diagnosis not present

## 2017-04-08 ENCOUNTER — Other Ambulatory Visit: Payer: Medicare Other

## 2017-04-08 DIAGNOSIS — B20 Human immunodeficiency virus [HIV] disease: Secondary | ICD-10-CM | POA: Diagnosis not present

## 2017-04-09 DIAGNOSIS — D631 Anemia in chronic kidney disease: Secondary | ICD-10-CM | POA: Diagnosis not present

## 2017-04-09 DIAGNOSIS — N186 End stage renal disease: Secondary | ICD-10-CM | POA: Diagnosis not present

## 2017-04-09 DIAGNOSIS — N2581 Secondary hyperparathyroidism of renal origin: Secondary | ICD-10-CM | POA: Diagnosis not present

## 2017-04-09 DIAGNOSIS — D509 Iron deficiency anemia, unspecified: Secondary | ICD-10-CM | POA: Diagnosis not present

## 2017-04-10 LAB — T-HELPER CELL (CD4) - (RCID CLINIC ONLY)
CD4 T CELL ABS: 670 /uL (ref 400–2700)
CD4 T CELL HELPER: 41 % (ref 33–55)

## 2017-04-10 LAB — HIV-1 RNA QUANT-NO REFLEX-BLD
HIV 1 RNA Quant: <20 copies/mL — AB
HIV-1 RNA Quant, Log: <1.3 Log copies/mL — AB

## 2017-04-11 DIAGNOSIS — N2581 Secondary hyperparathyroidism of renal origin: Secondary | ICD-10-CM | POA: Diagnosis not present

## 2017-04-11 DIAGNOSIS — D631 Anemia in chronic kidney disease: Secondary | ICD-10-CM | POA: Diagnosis not present

## 2017-04-11 DIAGNOSIS — N186 End stage renal disease: Secondary | ICD-10-CM | POA: Diagnosis not present

## 2017-04-11 DIAGNOSIS — D509 Iron deficiency anemia, unspecified: Secondary | ICD-10-CM | POA: Diagnosis not present

## 2017-04-14 ENCOUNTER — Other Ambulatory Visit: Payer: Self-pay

## 2017-04-14 DIAGNOSIS — N186 End stage renal disease: Secondary | ICD-10-CM | POA: Diagnosis not present

## 2017-04-14 DIAGNOSIS — D631 Anemia in chronic kidney disease: Secondary | ICD-10-CM | POA: Diagnosis not present

## 2017-04-14 DIAGNOSIS — Z992 Dependence on renal dialysis: Secondary | ICD-10-CM | POA: Diagnosis not present

## 2017-04-14 DIAGNOSIS — D509 Iron deficiency anemia, unspecified: Secondary | ICD-10-CM | POA: Diagnosis not present

## 2017-04-14 DIAGNOSIS — I120 Hypertensive chronic kidney disease with stage 5 chronic kidney disease or end stage renal disease: Secondary | ICD-10-CM | POA: Diagnosis not present

## 2017-04-14 DIAGNOSIS — R630 Anorexia: Secondary | ICD-10-CM

## 2017-04-14 DIAGNOSIS — N2581 Secondary hyperparathyroidism of renal origin: Secondary | ICD-10-CM | POA: Diagnosis not present

## 2017-04-14 NOTE — Telephone Encounter (Signed)
Pt called back stating he do not need the pain med until May 27.

## 2017-04-14 NOTE — Telephone Encounter (Signed)
dronabinol (MARINOL) 5 MG capsule,  oxyCODONE-acetaminophen (PERCOCET) 10-325 MG tablet, refill request.

## 2017-04-15 ENCOUNTER — Encounter: Payer: Self-pay | Admitting: Vascular Surgery

## 2017-04-15 ENCOUNTER — Ambulatory Visit (INDEPENDENT_AMBULATORY_CARE_PROVIDER_SITE_OTHER): Payer: Medicare Other | Admitting: Vascular Surgery

## 2017-04-15 VITALS — BP 108/68 | HR 87 | Temp 97.7°F | Resp 16 | Ht 74.5 in | Wt 198.0 lb

## 2017-04-15 DIAGNOSIS — Z992 Dependence on renal dialysis: Secondary | ICD-10-CM | POA: Diagnosis not present

## 2017-04-15 DIAGNOSIS — N186 End stage renal disease: Secondary | ICD-10-CM
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
## 2017-04-16 NOTE — Addendum Note (Signed)
Addended by: Lianne Cure A on: 04/16/2017 09:03 AM   Modules accepted: Orders

## 2017-04-18 DIAGNOSIS — D509 Iron deficiency anemia, unspecified: Secondary | ICD-10-CM | POA: Diagnosis not present

## 2017-04-18 DIAGNOSIS — D631 Anemia in chronic kidney disease: Secondary | ICD-10-CM | POA: Diagnosis not present

## 2017-04-18 DIAGNOSIS — E875 Hyperkalemia: Secondary | ICD-10-CM | POA: Diagnosis not present

## 2017-04-18 DIAGNOSIS — N2581 Secondary hyperparathyroidism of renal origin: Secondary | ICD-10-CM | POA: Diagnosis not present

## 2017-04-18 DIAGNOSIS — N186 End stage renal disease: Secondary | ICD-10-CM | POA: Diagnosis not present

## 2017-04-21 DIAGNOSIS — N186 End stage renal disease: Secondary | ICD-10-CM | POA: Diagnosis not present

## 2017-04-21 DIAGNOSIS — N2581 Secondary hyperparathyroidism of renal origin: Secondary | ICD-10-CM | POA: Diagnosis not present

## 2017-04-21 DIAGNOSIS — E875 Hyperkalemia: Secondary | ICD-10-CM | POA: Diagnosis not present

## 2017-04-21 DIAGNOSIS — D631 Anemia in chronic kidney disease: Secondary | ICD-10-CM | POA: Diagnosis not present

## 2017-04-21 DIAGNOSIS — D509 Iron deficiency anemia, unspecified: Secondary | ICD-10-CM | POA: Diagnosis not present

## 2017-04-25 DIAGNOSIS — E875 Hyperkalemia: Secondary | ICD-10-CM | POA: Diagnosis not present

## 2017-04-25 DIAGNOSIS — D631 Anemia in chronic kidney disease: Secondary | ICD-10-CM | POA: Diagnosis not present

## 2017-04-25 DIAGNOSIS — N186 End stage renal disease: Secondary | ICD-10-CM | POA: Diagnosis not present

## 2017-04-25 DIAGNOSIS — D509 Iron deficiency anemia, unspecified: Secondary | ICD-10-CM | POA: Diagnosis not present

## 2017-04-25 DIAGNOSIS — N2581 Secondary hyperparathyroidism of renal origin: Secondary | ICD-10-CM | POA: Diagnosis not present

## 2017-04-28 ENCOUNTER — Ambulatory Visit (INDEPENDENT_AMBULATORY_CARE_PROVIDER_SITE_OTHER)
Admission: RE | Admit: 2017-04-28 | Discharge: 2017-04-28 | Disposition: A | Discharge status: Home / Self Care | Payer: Medicare Other | Source: Ambulatory Visit | Attending: Surgery | Admitting: Surgery

## 2017-04-28 ENCOUNTER — Encounter: Payer: Self-pay | Admitting: Vascular Surgery

## 2017-04-28 ENCOUNTER — Ambulatory Visit (HOSPITAL_COMMUNITY)
Admission: RE | Admit: 2017-04-28 | Discharge: 2017-04-28 | Disposition: A | Discharge status: Home / Self Care | Payer: Medicare Other | Source: Ambulatory Visit | Attending: Surgery | Admitting: Surgery

## 2017-04-28 DIAGNOSIS — D509 Iron deficiency anemia, unspecified: Secondary | ICD-10-CM | POA: Diagnosis not present

## 2017-04-28 DIAGNOSIS — N186 End stage renal disease: Secondary | ICD-10-CM

## 2017-04-28 DIAGNOSIS — N2581 Secondary hyperparathyroidism of renal origin: Secondary | ICD-10-CM | POA: Diagnosis not present

## 2017-04-28 DIAGNOSIS — D631 Anemia in chronic kidney disease: Secondary | ICD-10-CM | POA: Diagnosis not present

## 2017-04-28 DIAGNOSIS — Z992 Dependence on renal dialysis: Secondary | ICD-10-CM

## 2017-04-28 DIAGNOSIS — E875 Hyperkalemia: Secondary | ICD-10-CM | POA: Diagnosis not present

## 2017-04-29 ENCOUNTER — Ambulatory Visit: Payer: Medicare Other | Admitting: Internal Medicine

## 2017-04-30 ENCOUNTER — Other Ambulatory Visit: Payer: Self-pay | Admitting: *Deleted

## 2017-04-30 ENCOUNTER — Encounter: Payer: Self-pay | Admitting: Vascular Surgery

## 2017-04-30 ENCOUNTER — Ambulatory Visit (INDEPENDENT_AMBULATORY_CARE_PROVIDER_SITE_OTHER): Payer: Medicare Other | Admitting: Vascular Surgery

## 2017-04-30 VITALS — BP 140/86 | HR 108 | Temp 97.4°F | Resp 18 | Ht 72.25 in | Wt 202.6 lb

## 2017-04-30 DIAGNOSIS — N2581 Secondary hyperparathyroidism of renal origin: Secondary | ICD-10-CM | POA: Diagnosis not present

## 2017-04-30 DIAGNOSIS — Z992 Dependence on renal dialysis: Secondary | ICD-10-CM | POA: Diagnosis not present

## 2017-04-30 DIAGNOSIS — N186 End stage renal disease: Secondary | ICD-10-CM | POA: Diagnosis not present

## 2017-04-30 DIAGNOSIS — E875 Hyperkalemia: Secondary | ICD-10-CM | POA: Diagnosis not present

## 2017-04-30 DIAGNOSIS — G2581 Restless legs syndrome: Secondary | ICD-10-CM

## 2017-04-30 DIAGNOSIS — D631 Anemia in chronic kidney disease: Secondary | ICD-10-CM | POA: Diagnosis not present

## 2017-04-30 DIAGNOSIS — D509 Iron deficiency anemia, unspecified: Secondary | ICD-10-CM | POA: Diagnosis not present
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
## 2017-05-01 MED ORDER — PRAMIPEXOLE DIHYDROCHLORIDE 0.25 MG PO TABS: 0.5000 mg | ORAL_TABLET | Freq: Every day | ORAL | 3 refills | Status: DC

## 2017-05-03 DIAGNOSIS — N186 End stage renal disease: Secondary | ICD-10-CM | POA: Diagnosis not present

## 2017-05-03 DIAGNOSIS — D509 Iron deficiency anemia, unspecified: Secondary | ICD-10-CM | POA: Diagnosis not present

## 2017-05-03 DIAGNOSIS — E875 Hyperkalemia: Secondary | ICD-10-CM | POA: Diagnosis not present

## 2017-05-03 DIAGNOSIS — N2581 Secondary hyperparathyroidism of renal origin: Secondary | ICD-10-CM | POA: Diagnosis not present

## 2017-05-03 DIAGNOSIS — D631 Anemia in chronic kidney disease: Secondary | ICD-10-CM | POA: Diagnosis not present

## 2017-05-05 DIAGNOSIS — N2581 Secondary hyperparathyroidism of renal origin: Secondary | ICD-10-CM | POA: Diagnosis not present

## 2017-05-05 DIAGNOSIS — E875 Hyperkalemia: Secondary | ICD-10-CM | POA: Diagnosis not present

## 2017-05-05 DIAGNOSIS — N186 End stage renal disease: Secondary | ICD-10-CM | POA: Diagnosis not present

## 2017-05-05 DIAGNOSIS — D509 Iron deficiency anemia, unspecified: Secondary | ICD-10-CM | POA: Diagnosis not present

## 2017-05-05 DIAGNOSIS — D631 Anemia in chronic kidney disease: Secondary | ICD-10-CM | POA: Diagnosis not present

## 2017-05-07 DIAGNOSIS — N2581 Secondary hyperparathyroidism of renal origin: Secondary | ICD-10-CM | POA: Diagnosis not present

## 2017-05-07 DIAGNOSIS — D509 Iron deficiency anemia, unspecified: Secondary | ICD-10-CM | POA: Diagnosis not present

## 2017-05-07 DIAGNOSIS — N186 End stage renal disease: Secondary | ICD-10-CM | POA: Diagnosis not present

## 2017-05-07 DIAGNOSIS — E875 Hyperkalemia: Secondary | ICD-10-CM | POA: Diagnosis not present

## 2017-05-07 DIAGNOSIS — D631 Anemia in chronic kidney disease: Secondary | ICD-10-CM | POA: Diagnosis not present

## 2017-05-09 DIAGNOSIS — D509 Iron deficiency anemia, unspecified: Secondary | ICD-10-CM | POA: Diagnosis not present

## 2017-05-09 DIAGNOSIS — E875 Hyperkalemia: Secondary | ICD-10-CM | POA: Diagnosis not present

## 2017-05-09 DIAGNOSIS — N186 End stage renal disease: Secondary | ICD-10-CM | POA: Diagnosis not present

## 2017-05-09 DIAGNOSIS — D631 Anemia in chronic kidney disease: Secondary | ICD-10-CM | POA: Diagnosis not present

## 2017-05-09 DIAGNOSIS — N2581 Secondary hyperparathyroidism of renal origin: Secondary | ICD-10-CM | POA: Diagnosis not present

## 2017-05-12 DIAGNOSIS — D631 Anemia in chronic kidney disease: Secondary | ICD-10-CM | POA: Diagnosis not present

## 2017-05-12 DIAGNOSIS — N2581 Secondary hyperparathyroidism of renal origin: Secondary | ICD-10-CM | POA: Diagnosis not present

## 2017-05-12 DIAGNOSIS — N186 End stage renal disease: Secondary | ICD-10-CM | POA: Diagnosis not present

## 2017-05-12 DIAGNOSIS — E875 Hyperkalemia: Secondary | ICD-10-CM | POA: Diagnosis not present

## 2017-05-12 DIAGNOSIS — D509 Iron deficiency anemia, unspecified: Secondary | ICD-10-CM | POA: Diagnosis not present

## 2017-05-13 ENCOUNTER — Other Ambulatory Visit: Payer: Self-pay

## 2017-05-13 DIAGNOSIS — Q613 Polycystic kidney, unspecified: Secondary | ICD-10-CM

## 2017-05-13 DIAGNOSIS — R630 Anorexia: Secondary | ICD-10-CM

## 2017-05-13 NOTE — Telephone Encounter (Signed)
dronabinol (MARINOL) 5 MG capsule,  oxyCODONE-acetaminophen (PERCOCET) 10-325 MG tablet, refill request. Please call pt back.

## 2017-05-13 NOTE — Telephone Encounter (Signed)
Last office visit: 02/04/2017 Last UDS: 02/04/2017 Last Refill: 04/11/2017 #90 (confirmed w/pharmacy) Next appt: 05/20/2017

## 2017-05-14 DIAGNOSIS — D509 Iron deficiency anemia, unspecified: Secondary | ICD-10-CM | POA: Diagnosis not present

## 2017-05-14 DIAGNOSIS — D631 Anemia in chronic kidney disease: Secondary | ICD-10-CM | POA: Diagnosis not present

## 2017-05-14 DIAGNOSIS — E875 Hyperkalemia: Secondary | ICD-10-CM | POA: Diagnosis not present

## 2017-05-14 DIAGNOSIS — N186 End stage renal disease: Secondary | ICD-10-CM | POA: Diagnosis not present

## 2017-05-14 DIAGNOSIS — N2581 Secondary hyperparathyroidism of renal origin: Secondary | ICD-10-CM | POA: Diagnosis not present

## 2017-05-15 ENCOUNTER — Telehealth: Payer: Self-pay | Admitting: *Deleted

## 2017-05-15 DIAGNOSIS — Q613 Polycystic kidney, unspecified: Secondary | ICD-10-CM

## 2017-05-15 DIAGNOSIS — I120 Hypertensive chronic kidney disease with stage 5 chronic kidney disease or end stage renal disease: Secondary | ICD-10-CM | POA: Diagnosis not present

## 2017-05-15 DIAGNOSIS — Z992 Dependence on renal dialysis: Secondary | ICD-10-CM | POA: Diagnosis not present

## 2017-05-15 DIAGNOSIS — N186 End stage renal disease: Secondary | ICD-10-CM | POA: Diagnosis not present

## 2017-05-15 MED ORDER — OXYCODONE-ACETAMINOPHEN 10-325 MG PO TABS: 1.0000 | ORAL_TABLET | ORAL | 0 refills | Status: DC | PRN

## 2017-05-15 MED ORDER — DRONABINOL 5 MG PO CAPS: 5.0000 mg | ORAL_CAPSULE | Freq: Two times a day (BID) | ORAL | 0 refills | Status: DC

## 2017-05-15 MED ORDER — CETIRIZINE HCL 5 MG PO TABS: 5.0000 mg | ORAL_TABLET | Freq: Every day | ORAL | 3 refills | Status: DC

## 2017-05-15 NOTE — Telephone Encounter (Signed)
I received a refill request for percocet, marinol, and zyrtec. Brian [name] was last seen 02/04/2017 and has his next appointment scheduled 05/20/2017 with me. At his last office visit he had an appropriate UDS and was provided three months worth of refills. Our office staff has confirmed with his pharmacy that his last refill was 04/11/2017 for percocet #90. I have printed a prescription for 1 month supply of percocet and marinol for him to pick up from our office. At his next visit on 6/5 I will provide him with Rx for 2 more months of refills and hope to have him scheduled for an appointment in 3 months to avoid this lapse in the future.

## 2017-05-15 NOTE — Telephone Encounter (Signed)
Reviewing script w/ dr blum

## 2017-05-16 DIAGNOSIS — E875 Hyperkalemia: Secondary | ICD-10-CM | POA: Diagnosis not present

## 2017-05-16 DIAGNOSIS — N2581 Secondary hyperparathyroidism of renal origin: Secondary | ICD-10-CM | POA: Diagnosis not present

## 2017-05-16 DIAGNOSIS — D631 Anemia in chronic kidney disease: Secondary | ICD-10-CM | POA: Diagnosis not present

## 2017-05-16 DIAGNOSIS — D509 Iron deficiency anemia, unspecified: Secondary | ICD-10-CM | POA: Diagnosis not present

## 2017-05-16 DIAGNOSIS — N186 End stage renal disease: Secondary | ICD-10-CM | POA: Diagnosis not present

## 2017-05-19 DIAGNOSIS — D509 Iron deficiency anemia, unspecified: Secondary | ICD-10-CM | POA: Diagnosis not present

## 2017-05-19 DIAGNOSIS — N2581 Secondary hyperparathyroidism of renal origin: Secondary | ICD-10-CM | POA: Diagnosis not present

## 2017-05-19 DIAGNOSIS — D631 Anemia in chronic kidney disease: Secondary | ICD-10-CM | POA: Diagnosis not present

## 2017-05-19 DIAGNOSIS — E875 Hyperkalemia: Secondary | ICD-10-CM | POA: Diagnosis not present

## 2017-05-19 DIAGNOSIS — N186 End stage renal disease: Secondary | ICD-10-CM | POA: Diagnosis not present

## 2017-05-20 ENCOUNTER — Encounter: Payer: Self-pay | Admitting: Internal Medicine

## 2017-05-20 ENCOUNTER — Ambulatory Visit (INDEPENDENT_AMBULATORY_CARE_PROVIDER_SITE_OTHER): Payer: Medicare Other | Admitting: Internal Medicine

## 2017-05-20 VITALS — BP 127/83 | HR 94 | Temp 97.6°F | Wt 204.2 lb

## 2017-05-20 DIAGNOSIS — Z86711 Personal history of pulmonary embolism: Secondary | ICD-10-CM | POA: Diagnosis not present

## 2017-05-20 DIAGNOSIS — Z992 Dependence on renal dialysis: Secondary | ICD-10-CM

## 2017-05-20 DIAGNOSIS — F5102 Adjustment insomnia: Secondary | ICD-10-CM

## 2017-05-20 DIAGNOSIS — G4700 Insomnia, unspecified: Secondary | ICD-10-CM

## 2017-05-20 DIAGNOSIS — Z79891 Long term (current) use of opiate analgesic: Secondary | ICD-10-CM | POA: Diagnosis not present

## 2017-05-20 DIAGNOSIS — Z7901 Long term (current) use of anticoagulants: Secondary | ICD-10-CM | POA: Diagnosis not present

## 2017-05-20 DIAGNOSIS — Z86718 Personal history of other venous thrombosis and embolism: Secondary | ICD-10-CM

## 2017-05-20 DIAGNOSIS — F17210 Nicotine dependence, cigarettes, uncomplicated: Secondary | ICD-10-CM

## 2017-05-20 DIAGNOSIS — J449 Chronic obstructive pulmonary disease, unspecified: Secondary | ICD-10-CM | POA: Diagnosis not present

## 2017-05-20 DIAGNOSIS — G8929 Other chronic pain: Secondary | ICD-10-CM

## 2017-05-20 DIAGNOSIS — Q613 Polycystic kidney, unspecified: Secondary | ICD-10-CM

## 2017-05-20 DIAGNOSIS — N186 End stage renal disease: Secondary | ICD-10-CM | POA: Diagnosis not present

## 2017-05-20 DIAGNOSIS — R630 Anorexia: Secondary | ICD-10-CM

## 2017-05-20 LAB — POCT INR: INR: 1.8

## 2017-05-20 MED ORDER — OXYCODONE-ACETAMINOPHEN 10-325 MG PO TABS: 1.0000 | ORAL_TABLET | ORAL | 0 refills | Status: DC | PRN

## 2017-05-20 MED ORDER — DRONABINOL 5 MG PO CAPS: 5.0000 mg | ORAL_CAPSULE | Freq: Two times a day (BID) | ORAL | 0 refills | Status: DC

## 2017-05-20 NOTE — Patient Instructions (Signed)
It was a pleasure to see you today Brian [name],   For your insomnia I have referred you for a sleep study  Please schedule a follow up appointment to see me in about 3 months (schedule it for the day that your pain medication prescription will run out)   Insomnia Insomnia is a sleep disorder that makes it difficult to fall asleep or to stay asleep. Insomnia can cause tiredness (fatigue), low energy, difficulty concentrating, mood swings, and poor performance at work or school. There are three different ways to classify insomnia:  Difficulty falling asleep.  Difficulty staying asleep.  Waking up too early in the morning.  Any type of insomnia can be long-term (chronic) or short-term (acute). Both are common. Short-term insomnia usually lasts for three months or less. Chronic insomnia occurs at least three times a week for longer than three months. What are the causes? Insomnia may be caused by another condition, situation, or substance, such as:  Anxiety.  Certain medicines.  Gastroesophageal reflux disease (GERD) or other gastrointestinal conditions.  Asthma or other breathing conditions.  Restless legs syndrome, sleep apnea, or other sleep disorders.  Chronic pain.  Menopause. This may include hot flashes.  Stroke.  Abuse of alcohol, tobacco, or illegal drugs.  Depression.  Caffeine.  Neurological disorders, such as Alzheimer disease.  An overactive thyroid (hyperthyroidism).  The cause of insomnia may not be known. What increases the risk? Risk factors for insomnia include:  Gender. Women are more commonly affected than men.  Age. Insomnia is more common as you get older.  Stress. This may involve your professional or personal life.  Income. Insomnia is more common in people with lower income.  Lack of exercise.  Irregular work schedule or night shifts.  Traveling between different time zones.  What are the signs or symptoms? If you have insomnia,  trouble falling asleep or trouble staying asleep is the main symptom. This may lead to other symptoms, such as:  Feeling fatigued.  Feeling nervous about going to sleep.  Not feeling rested in the morning.  Having trouble concentrating.  Feeling irritable, anxious, or depressed.  How is this treated? Treatment for insomnia depends on the cause. If your insomnia is caused by an underlying condition, treatment will focus on addressing the condition. Treatment may also include:  Medicines to help you sleep.  Counseling or therapy.  Lifestyle adjustments.  Follow these instructions at home:  Take medicines only as directed by your health care provider.  Keep regular sleeping and waking hours. Avoid naps.  Keep a sleep diary to help you and your health care provider figure out what could be causing your insomnia. Include: ? When you sleep. ? When you wake up during the night. ? How well you sleep. ? How rested you feel the next day. ? Any side effects of medicines you are taking. ? What you eat and drink.  Make your bedroom a comfortable place where it is easy to fall asleep: ? Put up shades or special blackout curtains to block light from outside. ? Use a white noise machine to block noise. ? Keep the temperature cool.  Exercise regularly as directed by your health care provider. Avoid exercising right before bedtime.  Use relaxation techniques to manage stress. Ask your health care provider to suggest some techniques that may work well for you. These may include: ? Breathing exercises. ? Routines to release muscle tension. ? Visualizing peaceful scenes.  Cut back on alcohol, caffeinated beverages, and cigarettes,  especially close to bedtime. These can disrupt your sleep.  Do not overeat or eat spicy foods right before bedtime. This can lead to digestive discomfort that can make it hard for you to sleep.  Limit screen use before bedtime. This includes: ? Watching  TV. ? Using your smartphone, tablet, and computer.  Stick to a routine. This can help you fall asleep faster. Try to do a quiet activity, brush your teeth, and go to bed at the same time each night.  Get out of bed if you are still awake after 15 minutes of trying to sleep. Keep the lights down, but try reading or doing a quiet activity. When you feel sleepy, go back to bed.  Make sure that you drive carefully. Avoid driving if you feel very sleepy.  Keep all follow-up appointments as directed by your health care provider. This is important. Contact a health care provider if:  You are tired throughout the day or have trouble in your daily routine due to sleepiness.  You continue to have sleep problems or your sleep problems get worse. Get help right away if:  You have serious thoughts about hurting yourself or someone else. This information is not intended to replace advice given to you by your health care provider. Make sure you discuss any questions you have with your health care provider. Document Released: 11/29/2000 Document Revised: 05/03/2016 Document Reviewed: 09/02/2014 Elsevier Interactive Patient Education  [employer].

## 2017-05-20 NOTE — Progress Notes (Signed)
Anticoagulation Management Brian [name] is a 41 y.o. male who reports to the clinic for monitoring of warfarin treatment.    Indication: history of DVT  Duration: indefinite Supervising physician: [facility] Visit History: Patient does not report signs/symptoms of bleeding or thromboembolism  Anticoagulation Episode Summary    Current INR goal:   2.0-3.0  TTR:   36.5 % (1.3 y)  Next INR check:   05/27/2017  INR from last check:   1.8! (05/20/2017)  Weekly max warfarin dose:     Target end date:   Indefinite  INR check location:     Preferred lab:     Send INR reminders to:      Indications   Deep vein thrombosis (HCC) (Resolved) [I82.409] History of deep vein thrombosis (Resolved) [Z86.718] PULMONARY EMBOLISM (Resolved) [I26.99] PULMONARY EMBOLISM (Resolved) [I26.99]       Comments:          ASSESSMENT Recent Results: The most recent result is correlated with 35 mg per week: Lab Results  Component Value Date   INR 1.8 05/20/2017   INR 1.20 11/25/2016   INR 2.5 09/03/2016   Anticoagulation Dosing: INR as of 05/20/2017 and Previous Warfarin Dosing Information    INR Dt INR Goal Wkly Tot Sun Mon Tue Wed Thu Fri Sat   05/20/2017 1.8 2.0-3.0 35 mg 5 mg 5 mg 5 mg 5 mg 5 mg 5 mg 5 mg   Patient deviated from recommended dosing.       Anticoagulation Warfarin Dose Instructions as of 05/20/2017      Total Sun Mon Tue Wed Thu Fri Sat   New Dose 37.5 mg 5 mg 5 mg 7.5 mg 5 mg 5 mg 5 mg 5 mg     (5 mg x 1)  (5 mg x 1)  (5 mg x 1.5)  (5 mg x 1)  (5 mg x 1)  (5 mg x 1)  (5 mg x 1)                           INR today: Subtherapeutic  PLAN Weekly dose was increased by 7% to 37.5 mg per week  Patient Instructions  It was a pleasure to see you today Mr. Kau,   For your insomnia I have referred you for a sleep study  Please schedule a follow up appointment to see me in about 3 months (schedule it for the day that your pain medication  prescription will run out)   Insomnia Insomnia is a sleep disorder that makes it difficult to fall asleep or to stay asleep. Insomnia can cause tiredness (fatigue), low energy, difficulty concentrating, mood swings, and poor performance at work or school. There are three different ways to classify insomnia:  Difficulty falling asleep.  Difficulty staying asleep.  Waking up too early in the morning.  Any type of insomnia can be long-term (chronic) or short-term (acute). Both are common. Short-term insomnia usually lasts for three months or less. Chronic insomnia occurs at least three times a week for longer than three months. What are the causes? Insomnia may be caused by another condition, situation, or substance, such as:  Anxiety.  Certain medicines.  Gastroesophageal reflux disease (GERD) or other gastrointestinal conditions.  Asthma or other breathing conditions.  Restless legs syndrome, sleep apnea, or other sleep disorders.  Chronic pain.  Menopause. This may include hot flashes.  Stroke.  Abuse of alcohol, tobacco, or illegal  drugs.  Depression.  Caffeine.  Neurological disorders, such as Alzheimer disease.  An overactive thyroid (hyperthyroidism).  The cause of insomnia may not be known. What increases the risk? Risk factors for insomnia include:  Gender. Women are more commonly affected than men.  Age. Insomnia is more common as you get older.  Stress. This may involve your professional or personal life.  Income. Insomnia is more common in people with lower income.  Lack of exercise.  Irregular work schedule or night shifts.  Traveling between different time zones.  What are the signs or symptoms? If you have insomnia, trouble falling asleep or trouble staying asleep is the main symptom. This may lead to other symptoms, such as:  Feeling fatigued.  Feeling nervous about going to sleep.  Not feeling rested in the morning.  Having trouble  concentrating.  Feeling irritable, anxious, or depressed.  How is this treated? Treatment for insomnia depends on the cause. If your insomnia is caused by an underlying condition, treatment will focus on addressing the condition. Treatment may also include:  Medicines to help you sleep.  Counseling or therapy.  Lifestyle adjustments.  Follow these instructions at home:  Take medicines only as directed by your health care provider.  Keep regular sleeping and waking hours. Avoid naps.  Keep a sleep diary to help you and your health care provider figure out what could be causing your insomnia. Include: ? When you sleep. ? When you wake up during the night. ? How well you sleep. ? How rested you feel the next day. ? Any side effects of medicines you are taking. ? What you eat and drink.  Make your bedroom a comfortable place where it is easy to fall asleep: ? Put up shades or special blackout curtains to block light from outside. ? Use a white noise machine to block noise. ? Keep the temperature cool.  Exercise regularly as directed by your health care provider. Avoid exercising right before bedtime.  Use relaxation techniques to manage stress. Ask your health care provider to suggest some techniques that may work well for you. These may include: ? Breathing exercises. ? Routines to release muscle tension. ? Visualizing peaceful scenes.  Cut back on alcohol, caffeinated beverages, and cigarettes, especially close to bedtime. These can disrupt your sleep.  Do not overeat or eat spicy foods right before bedtime. This can lead to digestive discomfort that can make it hard for you to sleep.  Limit screen use before bedtime. This includes: ? Watching TV. ? Using your smartphone, tablet, and computer.  Stick to a routine. This can help you fall asleep faster. Try to do a quiet activity, brush your teeth, and go to bed at the same time each night.  Get out of bed if you are  still awake after 15 minutes of trying to sleep. Keep the lights down, but try reading or doing a quiet activity. When you feel sleepy, go back to bed.  Make sure that you drive carefully. Avoid driving if you feel very sleepy.  Keep all follow-up appointments as directed by your health care provider. This is important. Contact a health care provider if:  You are tired throughout the day or have trouble in your daily routine due to sleepiness.  You continue to have sleep problems or your sleep problems get worse. Get help right away if:  You have serious thoughts about hurting yourself or someone else. This information is not intended to replace advice given to you  by your health care provider. Make sure you discuss any questions you have with your health care provider. Document Released: 11/29/2000 Document Revised: 05/03/2016 Document Reviewed: 09/02/2014 Elsevier Interactive Patient Education  [employer].   Patient advised to contact clinic or seek medical attention if signs/symptoms of bleeding or thromboembolism occur.  Patient verbalized understanding by repeating back information and was advised to contact me if further medication-related questions arise. Patient was also provided an information handout.  Follow-up Return in about 1 week (around 05/27/2017), or if symptoms worsen or fail to improve.  [name]

## 2017-05-21 DIAGNOSIS — E875 Hyperkalemia: Secondary | ICD-10-CM | POA: Diagnosis not present

## 2017-05-21 DIAGNOSIS — D509 Iron deficiency anemia, unspecified: Secondary | ICD-10-CM | POA: Diagnosis not present

## 2017-05-21 DIAGNOSIS — D631 Anemia in chronic kidney disease: Secondary | ICD-10-CM | POA: Diagnosis not present

## 2017-05-21 DIAGNOSIS — N186 End stage renal disease: Secondary | ICD-10-CM | POA: Diagnosis not present

## 2017-05-21 DIAGNOSIS — N2581 Secondary hyperparathyroidism of renal origin: Secondary | ICD-10-CM | POA: Diagnosis not present

## 2017-05-23 DIAGNOSIS — N186 End stage renal disease: Secondary | ICD-10-CM | POA: Diagnosis not present

## 2017-05-23 DIAGNOSIS — N2581 Secondary hyperparathyroidism of renal origin: Secondary | ICD-10-CM | POA: Diagnosis not present

## 2017-05-23 DIAGNOSIS — E875 Hyperkalemia: Secondary | ICD-10-CM | POA: Diagnosis not present

## 2017-05-23 DIAGNOSIS — D631 Anemia in chronic kidney disease: Secondary | ICD-10-CM | POA: Diagnosis not present

## 2017-05-23 DIAGNOSIS — D509 Iron deficiency anemia, unspecified: Secondary | ICD-10-CM | POA: Diagnosis not present

## 2017-05-23 NOTE — Assessment & Plan Note (Signed)
I believe a large component of his difficulty sleeping is related to the stress of his recent breakup, the patient agrees with this. The fact that he has been told he stops breathing in his sleep is concerning for sleep apnea. He request a referral for sleep study and I have placed one.  - counseled on better sleep hygiene  -referral to sleep medicine  - recommended OTC melatonin
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
## 2017-05-23 NOTE — Assessment & Plan Note (Signed)
Brian [name] is on chronic opioid therapy for chronic pain. The date of the controlled substances contract is referenced in the [place] and / or the overview. Date of pain contract was 09/27/2015. As part of the treatment plan, the [place] controlled substance database is checked at least twice yearly and the database results are appropriate. I have last reviewed the results on 02/04/2017.   The last UDS was on 02/04/2017 and results are as expected. Patient needs at least a yearly UDS.   The patient is on oxycodone/acetaminophen (Percocet, Tylox) strength 10-325mg , 90 per 30 days and Dronabinol 5 mg BID. Adjunctive treatment includes Muscle relaxants. This regimen allows Brian [name] to function and does not cause excessive sedation or other side effects.  "The benefits of continuing opioid therapy outweigh the risks and chronic opioids will be continued. Ongoing education about safe opioid treatment is provided  Interventions today include: Refills - 2 paper Rx printed  This follow up is a few days over three months from his last office visit. I had received an electronic request for refill of percocet and dronabinol and provided him 1 month supply of these prescriptions last week which he picked up from the office. Today he will receive 2 additional months of prescriptions

## 2017-05-24 NOTE — Progress Notes (Signed)
Internal Medicine Clinic Attending  Case discussed with Dr. Blum at the time of the visit.  We reviewed the resident's history and exam and pertinent patient test results.  I agree with the assessment, diagnosis, and plan of care documented in the resident's note. 

## 2017-05-26 DIAGNOSIS — D509 Iron deficiency anemia, unspecified: Secondary | ICD-10-CM | POA: Diagnosis not present

## 2017-05-26 DIAGNOSIS — E875 Hyperkalemia: Secondary | ICD-10-CM | POA: Diagnosis not present

## 2017-05-26 DIAGNOSIS — N2581 Secondary hyperparathyroidism of renal origin: Secondary | ICD-10-CM | POA: Diagnosis not present

## 2017-05-26 DIAGNOSIS — D631 Anemia in chronic kidney disease: Secondary | ICD-10-CM | POA: Diagnosis not present

## 2017-05-26 DIAGNOSIS — N186 End stage renal disease: Secondary | ICD-10-CM | POA: Diagnosis not present

## 2017-05-27 ENCOUNTER — Ambulatory Visit: Payer: Self-pay | Admitting: Pharmacist

## 2017-05-30 ENCOUNTER — Other Ambulatory Visit: Payer: Self-pay | Admitting: Internal Medicine

## 2017-05-30 DIAGNOSIS — D509 Iron deficiency anemia, unspecified: Secondary | ICD-10-CM | POA: Diagnosis not present

## 2017-05-30 DIAGNOSIS — N186 End stage renal disease: Secondary | ICD-10-CM | POA: Diagnosis not present

## 2017-05-30 DIAGNOSIS — Z9109 Other allergy status, other than to drugs and biological substances: Secondary | ICD-10-CM

## 2017-05-30 DIAGNOSIS — D631 Anemia in chronic kidney disease: Secondary | ICD-10-CM | POA: Diagnosis not present

## 2017-05-30 DIAGNOSIS — E875 Hyperkalemia: Secondary | ICD-10-CM | POA: Diagnosis not present

## 2017-05-30 DIAGNOSIS — N2581 Secondary hyperparathyroidism of renal origin: Secondary | ICD-10-CM | POA: Diagnosis not present

## 2017-06-02 DIAGNOSIS — N2581 Secondary hyperparathyroidism of renal origin: Secondary | ICD-10-CM | POA: Diagnosis not present

## 2017-06-02 DIAGNOSIS — D631 Anemia in chronic kidney disease: Secondary | ICD-10-CM | POA: Diagnosis not present

## 2017-06-02 DIAGNOSIS — D509 Iron deficiency anemia, unspecified: Secondary | ICD-10-CM | POA: Diagnosis not present

## 2017-06-02 DIAGNOSIS — E875 Hyperkalemia: Secondary | ICD-10-CM | POA: Diagnosis not present

## 2017-06-02 DIAGNOSIS — N186 End stage renal disease: Secondary | ICD-10-CM | POA: Diagnosis not present

## 2017-06-03 ENCOUNTER — Ambulatory Visit (INDEPENDENT_AMBULATORY_CARE_PROVIDER_SITE_OTHER): Payer: Medicare Other

## 2017-06-03 DIAGNOSIS — I82403 Acute embolism and thrombosis of unspecified deep veins of lower extremity, bilateral: Secondary | ICD-10-CM | POA: Diagnosis present

## 2017-06-03 DIAGNOSIS — Z7901 Long term (current) use of anticoagulants: Secondary | ICD-10-CM

## 2017-06-03 LAB — POCT INR: INR: 1.3

## 2017-06-03 NOTE — Progress Notes (Signed)
Anticoagulation Management Brian [name] is a 41 y.o. male who reports to the clinic for monitoring of warfarin treatment.    Indication: DVT and PE  Duration: indefinite Supervising physician: [facility] Visit History: Patient does not report signs/symptoms of bleeding or thromboembolism  Other recent changes: Patient reports that he may have missed his dose of warfarin last evening but isn't sure. No other changes.  Anticoagulation Episode Summary    Current INR goal:   2.0-3.0  TTR:   35.4 % (1.3 y)  Next INR check:   06/17/2017  INR from last check:   1.3! (06/03/2017)  Weekly max warfarin dose:     Target end date:   Indefinite  INR check location:     Preferred lab:     Send INR reminders to:      Indications   Deep vein thrombosis (HCC) (Resolved) [I82.409] History of deep vein thrombosis (Resolved) [Z86.718] PULMONARY EMBOLISM (Resolved) [I26.99] PULMONARY EMBOLISM (Resolved) [I26.99]       Comments:          ASSESSMENT Recent Results: The most recent result is correlated with 37.5 mg per week: Lab Results  Component Value Date   INR 1.3 06/03/2017   INR 1.8 05/20/2017   INR 1.20 11/25/2016    Anticoagulation Dosing: INR as of 06/03/2017 and Previous Warfarin Dosing Information    INR Dt INR Goal [employer] Sun Mon Tue Wed Thu Fri Sat   06/03/2017 1.3 2.0-3.0 37.5 mg 5 mg 5 mg 7.5 mg 5 mg 5 mg 5 mg 5 mg    Anticoagulation Warfarin Dose Instructions as of 06/03/2017      Total Sun Mon Tue Wed Thu Fri Sat   New Dose 42.5 mg 5 mg 5 mg 7.5 mg 5 mg 7.5 mg 5 mg 7.5 mg     (5 mg x 1)  (5 mg x 1)  (5 mg x 1.5)  (5 mg x 1)  (5 mg x 1.5)  (5 mg x 1)  (5 mg x 1.5)                         Description   Take 1 tablet (5mg ) daily except 1.5 tablets (7.5mg ) on Tuesdays, Thursdays, and Saturdays      INR today: Subtherapeutic  PLAN Weekly dose was increased by 13% to 42.5 mg per week.  Patient Instructions  Patient instructed to  take medications as defined in the Anti-coagulation Track section of this encounter.  Patient instructed to take today's dose and increase weekly dose: Take 1 tablet (5mg ) daily except 1.5 tablets (7.5mg ) on Tuesdays, Thursdays, and Saturdays. Patient verbalized understanding of these instructions.    Patient advised to contact clinic or seek medical attention if signs/symptoms of bleeding or thromboembolism occur.  Patient verbalized understanding by repeating back information and was advised to contact me if further medication-related questions arise. Patient was also provided an information handout.  Follow-up Return in about 2 weeks (around 06/17/2017) for follow up INR.  Brian [name]  15 minutes spent face-to-face with the patient during the encounter. 30% of time spent on education. 70% of time was spent obtaining sample for POC INR, interpretation, and determination of dose increase.

## 2017-06-03 NOTE — Patient Instructions (Signed)
Patient instructed to take medications as defined in the Anti-coagulation Track section of this encounter.  Patient instructed to take today's dose and increase weekly dose: Take 1 tablet (5mg ) daily except 1.5 tablets (7.5mg ) on Tuesdays, Thursdays, and Saturdays. Patient verbalized understanding of these instructions.

## 2017-06-04 DIAGNOSIS — N2581 Secondary hyperparathyroidism of renal origin: Secondary | ICD-10-CM | POA: Diagnosis not present

## 2017-06-04 DIAGNOSIS — D509 Iron deficiency anemia, unspecified: Secondary | ICD-10-CM | POA: Diagnosis not present

## 2017-06-04 DIAGNOSIS — N186 End stage renal disease: Secondary | ICD-10-CM | POA: Diagnosis not present

## 2017-06-04 DIAGNOSIS — E875 Hyperkalemia: Secondary | ICD-10-CM | POA: Diagnosis not present

## 2017-06-04 DIAGNOSIS — D631 Anemia in chronic kidney disease: Secondary | ICD-10-CM | POA: Diagnosis not present

## 2017-06-06 DIAGNOSIS — N2581 Secondary hyperparathyroidism of renal origin: Secondary | ICD-10-CM | POA: Diagnosis not present

## 2017-06-06 DIAGNOSIS — E875 Hyperkalemia: Secondary | ICD-10-CM | POA: Diagnosis not present

## 2017-06-06 DIAGNOSIS — D509 Iron deficiency anemia, unspecified: Secondary | ICD-10-CM | POA: Diagnosis not present

## 2017-06-06 DIAGNOSIS — N186 End stage renal disease: Secondary | ICD-10-CM | POA: Diagnosis not present

## 2017-06-06 DIAGNOSIS — D631 Anemia in chronic kidney disease: Secondary | ICD-10-CM | POA: Diagnosis not present

## 2017-06-09 DIAGNOSIS — N2581 Secondary hyperparathyroidism of renal origin: Secondary | ICD-10-CM | POA: Diagnosis not present

## 2017-06-09 DIAGNOSIS — N186 End stage renal disease: Secondary | ICD-10-CM | POA: Diagnosis not present

## 2017-06-09 DIAGNOSIS — E875 Hyperkalemia: Secondary | ICD-10-CM | POA: Diagnosis not present

## 2017-06-09 DIAGNOSIS — D631 Anemia in chronic kidney disease: Secondary | ICD-10-CM | POA: Diagnosis not present

## 2017-06-09 DIAGNOSIS — D509 Iron deficiency anemia, unspecified: Secondary | ICD-10-CM | POA: Diagnosis not present

## 2017-06-11 ENCOUNTER — Telehealth: Payer: Self-pay | Admitting: Internal Medicine

## 2017-06-11 DIAGNOSIS — N2581 Secondary hyperparathyroidism of renal origin: Secondary | ICD-10-CM | POA: Diagnosis not present

## 2017-06-11 DIAGNOSIS — D509 Iron deficiency anemia, unspecified: Secondary | ICD-10-CM | POA: Diagnosis not present

## 2017-06-11 DIAGNOSIS — E875 Hyperkalemia: Secondary | ICD-10-CM | POA: Diagnosis not present

## 2017-06-11 DIAGNOSIS — N186 End stage renal disease: Secondary | ICD-10-CM | POA: Diagnosis not present

## 2017-06-11 DIAGNOSIS — D631 Anemia in chronic kidney disease: Secondary | ICD-10-CM | POA: Diagnosis not present

## 2017-06-11 NOTE — Telephone Encounter (Signed)
[facility] Nurse calling in reference to the patient's coumadin in regards to him having a procedure done on June 25, 2017. Questions as to  if the patient can hold his coumadin 5 days prior.  Please call back as soon as possible.

## 2017-06-12 ENCOUNTER — Other Ambulatory Visit: Payer: Self-pay

## 2017-06-12 NOTE — Telephone Encounter (Signed)
I could help to coordinate a Lovenox bridge but have been unable to reach the patient. Please let me know any other thoughts

## 2017-06-12 NOTE — Telephone Encounter (Signed)
Dr. Maudie Mercury, His last VTE was over 3 months ago, could we have him hold the warfarin 5 days prior and do a post op lovenox bridge? The surgery is scheduled for 7/11.

## 2017-06-13 DIAGNOSIS — D631 Anemia in chronic kidney disease: Secondary | ICD-10-CM | POA: Diagnosis not present

## 2017-06-13 DIAGNOSIS — N2581 Secondary hyperparathyroidism of renal origin: Secondary | ICD-10-CM | POA: Diagnosis not present

## 2017-06-13 DIAGNOSIS — D509 Iron deficiency anemia, unspecified: Secondary | ICD-10-CM | POA: Diagnosis not present

## 2017-06-13 DIAGNOSIS — E875 Hyperkalemia: Secondary | ICD-10-CM | POA: Diagnosis not present

## 2017-06-13 DIAGNOSIS — N186 End stage renal disease: Secondary | ICD-10-CM | POA: Diagnosis not present

## 2017-06-13 NOTE — Telephone Encounter (Signed)
Dr. Maudie Mercury was able to reach Brian [name] and arranged for an appointment with him on 7/3 to discuss the lovenox to coumadin bridge.

## 2017-06-14 DIAGNOSIS — Z992 Dependence on renal dialysis: Secondary | ICD-10-CM | POA: Diagnosis not present

## 2017-06-14 DIAGNOSIS — N186 End stage renal disease: Secondary | ICD-10-CM | POA: Diagnosis not present

## 2017-06-14 DIAGNOSIS — I120 Hypertensive chronic kidney disease with stage 5 chronic kidney disease or end stage renal disease: Secondary | ICD-10-CM | POA: Diagnosis not present

## 2017-06-16 DIAGNOSIS — D631 Anemia in chronic kidney disease: Secondary | ICD-10-CM | POA: Diagnosis not present

## 2017-06-16 DIAGNOSIS — N2581 Secondary hyperparathyroidism of renal origin: Secondary | ICD-10-CM | POA: Diagnosis not present

## 2017-06-16 DIAGNOSIS — N186 End stage renal disease: Secondary | ICD-10-CM | POA: Diagnosis not present

## 2017-06-16 DIAGNOSIS — D509 Iron deficiency anemia, unspecified: Secondary | ICD-10-CM | POA: Diagnosis not present

## 2017-06-17 ENCOUNTER — Ambulatory Visit: Payer: Self-pay | Admitting: Pharmacist

## 2017-06-18 DIAGNOSIS — D631 Anemia in chronic kidney disease: Secondary | ICD-10-CM | POA: Diagnosis not present

## 2017-06-18 DIAGNOSIS — N186 End stage renal disease: Secondary | ICD-10-CM | POA: Diagnosis not present

## 2017-06-18 DIAGNOSIS — N2581 Secondary hyperparathyroidism of renal origin: Secondary | ICD-10-CM | POA: Diagnosis not present

## 2017-06-18 DIAGNOSIS — D509 Iron deficiency anemia, unspecified: Secondary | ICD-10-CM | POA: Diagnosis not present

## 2017-06-19 ENCOUNTER — Ambulatory Visit (INDEPENDENT_AMBULATORY_CARE_PROVIDER_SITE_OTHER): Payer: Medicare Other | Admitting: Neurology

## 2017-06-19 ENCOUNTER — Encounter: Payer: Self-pay | Admitting: Neurology

## 2017-06-19 VITALS — BP 106/75 | HR 110 | Ht 74.0 in | Wt 191.0 lb

## 2017-06-19 DIAGNOSIS — R0681 Apnea, not elsewhere classified: Secondary | ICD-10-CM | POA: Diagnosis not present

## 2017-06-19 DIAGNOSIS — Z820 Family history of epilepsy and other diseases of the nervous system: Secondary | ICD-10-CM

## 2017-06-19 DIAGNOSIS — N186 End stage renal disease: Secondary | ICD-10-CM | POA: Diagnosis not present

## 2017-06-19 DIAGNOSIS — Z992 Dependence on renal dialysis: Secondary | ICD-10-CM

## 2017-06-19 DIAGNOSIS — G2581 Restless legs syndrome: Secondary | ICD-10-CM

## 2017-06-19 DIAGNOSIS — F1190 Opioid use, unspecified, uncomplicated: Secondary | ICD-10-CM | POA: Diagnosis not present

## 2017-06-19 DIAGNOSIS — R0683 Snoring: Secondary | ICD-10-CM | POA: Diagnosis not present
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
## 2017-06-19 NOTE — Patient Instructions (Signed)

## 2017-06-20 DIAGNOSIS — D631 Anemia in chronic kidney disease: Secondary | ICD-10-CM | POA: Diagnosis not present

## 2017-06-20 DIAGNOSIS — D509 Iron deficiency anemia, unspecified: Secondary | ICD-10-CM | POA: Diagnosis not present

## 2017-06-20 DIAGNOSIS — N2581 Secondary hyperparathyroidism of renal origin: Secondary | ICD-10-CM | POA: Diagnosis not present

## 2017-06-20 DIAGNOSIS — N186 End stage renal disease: Secondary | ICD-10-CM | POA: Diagnosis not present

## 2017-06-23 ENCOUNTER — Encounter (HOSPITAL_COMMUNITY): Payer: Self-pay | Admitting: *Deleted

## 2017-06-23 ENCOUNTER — Ambulatory Visit (INDEPENDENT_AMBULATORY_CARE_PROVIDER_SITE_OTHER): Payer: Medicare Other | Admitting: Pharmacist

## 2017-06-23 DIAGNOSIS — N2581 Secondary hyperparathyroidism of renal origin: Secondary | ICD-10-CM | POA: Diagnosis not present

## 2017-06-23 DIAGNOSIS — Z86718 Personal history of other venous thrombosis and embolism: Secondary | ICD-10-CM

## 2017-06-23 DIAGNOSIS — N186 End stage renal disease: Secondary | ICD-10-CM | POA: Diagnosis not present

## 2017-06-23 DIAGNOSIS — D509 Iron deficiency anemia, unspecified: Secondary | ICD-10-CM | POA: Diagnosis not present

## 2017-06-23 DIAGNOSIS — D631 Anemia in chronic kidney disease: Secondary | ICD-10-CM | POA: Diagnosis not present

## 2017-06-23 DIAGNOSIS — Z7901 Long term (current) use of anticoagulants: Secondary | ICD-10-CM | POA: Diagnosis not present

## 2017-06-23 LAB — POCT INR: INR: 1.1

## 2017-06-23 NOTE — Patient Instructions (Signed)
Patient instructed to take medications as defined in the Anti-coagulation Track section of this encounter.  Patient instructed to OMIT/HOLD (Do NOT take warfarin) on Monday July 9; Tuesday July 10, and Wednesday June 25, 2017. RESUME warfarin when instructed by [facility] surgeons (typically, 24 hours after a procedure--but follow their instructions). WHEN you resume warfarin take:  1&1/2 tablets of your 5mg  peach-colored warfarin tablets on Thursdays, and Saturdays; on Friday and Sunday--take only ONE (1) tablet of your 5mg  peach-colored warfarin tablets. Return to clinic on Monday, July 16 at 8:30AM for repeat INR.  Patient verbalized understanding of these instructions.

## 2017-06-23 NOTE — Progress Notes (Signed)
INTERNAL MEDICINE TEACHING ATTENDING ADDENDUM ° °I agree with pharmacy recommendations as outlined in their note.  ° °-Eliah Ozawa MD ° °

## 2017-06-23 NOTE — Progress Notes (Signed)
Anticoagulation Management AUTHOR HATLESTAD is a 41 y.o. male who reports to the clinic for monitoring of warfarin treatment.    Indication: DVT , history of; PE, history of; long term use of anticoagulants. Duration: indefinite Supervising physician: Lalla Brothers  Anticoagulation Clinic Visit History: Patient does not report signs/symptoms of bleeding or thromboembolism  Other recent changes: No diet, medications, lifestyle changes endorsed by the patient.  Anticoagulation Episode Summary    Current INR goal:   2.0-3.0  TTR:   34.0 % (1.3 y)  Next INR check:   06/30/2017  INR from last check:   1.10! (06/23/2017)  Weekly max warfarin dose:     Target end date:   Indefinite  INR check location:     Preferred lab:     Send INR reminders to:      Indications   Deep vein thrombosis (HCC) (Resolved) [I82.409] History of deep vein thrombosis (Resolved) [Z86.718] PULMONARY EMBOLISM (Resolved) [I26.99] PULMONARY EMBOLISM (Resolved) [I26.99]       Comments:          ASSESSMENT Recent Results: The most recent result is correlated with NO WARFARIN since this past Friday, 6-JUL-18 per instructions of [facility]geons who are putting in a new HD fistual into LEFT extremity on 11-JUL-18.  Lab Results  Component Value Date   INR 1.10 06/23/2017   INR 1.3 06/03/2017   INR 1.8 05/20/2017    Anticoagulation Dosing: INR as of 06/23/2017 and Previous Warfarin Dosing Information    INR Dt INR Goal [name] Sun Mon Tue Wed Thu Fri Sat   06/23/2017 1.10 2.0-3.0 12.5 mg 0 mg 0 mg 0 mg 0 mg 7.5 mg 5 mg 0 mg   Patient deviated from recommended dosing.       Previous description   Take 1 tablet (5mg ) daily except 1.5 tablets (7.5mg ) on Tuesdays, Thursdays, and Saturdays    Anticoagulation Warfarin Dose Instructions as of 06/23/2017      Total Sun Mon Tue Wed Thu Fri Sat   New Dose 25 mg 5 mg Hold Hold Hold 7.5 mg 5 mg 7.5 mg     (5 mg x 1)  -  -  -  (5 mg x 1.5)  (5 mg x  1)  (5 mg x 1.5)                         Description   OMIT/HOLD (Do NOT take warfarin) on Monday July 9; Tuesday July 10, and Wednesday June 25, 2017. RESUME warfarin when instructed by [facility] surgeons (typically, 24 hours after a procedure--but follow their instructions). WHEN you resume warfarin take:  1&1/2 tablets of your 5mg  peach-colored warfarin tablets on Thursdays, and Saturdays; on Friday and Sunday--take only ONE (1) tablet of your 5mg  peach-colored warfarin tablets. Return to clinic on Monday, July 16 at 4:30PM for repeat INR.      INR today: Supratherapeutic  PLAN Weekly dose was deferred at this time while awaiting surgery on 11-JUL-18. Will RESUME warfarin based upon previous instructions provided--when authorized by his surgeon after the procedure.   There are no Patient Instructions on file for this visit. Patient advised to contact clinic or seek medical attention if signs/symptoms of bleeding or thromboembolism occur.  Patient verbalized understanding by repeating back information and was advised to contact me if further medication-related questions arise. Patient was also provided an information handout.  Follow-up Return in 7 days (  on 06/30/2017) for Follow up INR at 8:30AM.  Ceasar Lund, Eustaquio Boyden PharmD, CACP, CPP  15 minutes spent face-to-face with the patient during the encounter. 50% of time spent on education. 50% of time was spent on finger-stick point of care INR sample collection, processing, interpretation and data entry into EPIC/CHL and [email or web address].

## 2017-06-23 NOTE — Progress Notes (Signed)
Spoke with pt for pre-op call. Pt denies cardiac history except for a heart murmur. Denies chest pain or sob. Pt states he is not diabetic.

## 2017-06-25 ENCOUNTER — Ambulatory Visit (HOSPITAL_COMMUNITY): Payer: Medicare Other | Admitting: Anesthesiology

## 2017-06-25 ENCOUNTER — Ambulatory Visit (HOSPITAL_COMMUNITY)
Admission: RE | Admit: 2017-06-25 | Discharge: 2017-06-25 | Disposition: A | Discharge status: Home / Self Care | Payer: Medicare Other | Source: Ambulatory Visit | Attending: Vascular Surgery | Admitting: Vascular Surgery

## 2017-06-25 ENCOUNTER — Encounter (HOSPITAL_COMMUNITY): Payer: Self-pay | Admitting: *Deleted

## 2017-06-25 ENCOUNTER — Encounter (HOSPITAL_COMMUNITY)
Admission: RE | Disposition: A | Discharge status: Home / Self Care | Payer: Self-pay | Source: Ambulatory Visit | Attending: Vascular Surgery

## 2017-06-25 DIAGNOSIS — Z86718 Personal history of other venous thrombosis and embolism: Secondary | ICD-10-CM | POA: Insufficient documentation

## 2017-06-25 DIAGNOSIS — G2581 Restless legs syndrome: Secondary | ICD-10-CM | POA: Diagnosis not present

## 2017-06-25 DIAGNOSIS — N185 Chronic kidney disease, stage 5: Secondary | ICD-10-CM

## 2017-06-25 DIAGNOSIS — Z79891 Long term (current) use of opiate analgesic: Secondary | ICD-10-CM | POA: Diagnosis not present

## 2017-06-25 DIAGNOSIS — D631 Anemia in chronic kidney disease: Secondary | ICD-10-CM | POA: Diagnosis not present

## 2017-06-25 DIAGNOSIS — Z79899 Other long term (current) drug therapy: Secondary | ICD-10-CM | POA: Insufficient documentation

## 2017-06-25 DIAGNOSIS — D509 Iron deficiency anemia, unspecified: Secondary | ICD-10-CM | POA: Diagnosis not present

## 2017-06-25 DIAGNOSIS — F17210 Nicotine dependence, cigarettes, uncomplicated: Secondary | ICD-10-CM | POA: Diagnosis not present

## 2017-06-25 DIAGNOSIS — Z992 Dependence on renal dialysis: Secondary | ICD-10-CM | POA: Insufficient documentation

## 2017-06-25 DIAGNOSIS — I959 Hypotension, unspecified: Secondary | ICD-10-CM | POA: Diagnosis not present

## 2017-06-25 DIAGNOSIS — Z21 Asymptomatic human immunodeficiency virus [HIV] infection status: Secondary | ICD-10-CM | POA: Insufficient documentation

## 2017-06-25 DIAGNOSIS — Z7901 Long term (current) use of anticoagulants: Secondary | ICD-10-CM | POA: Diagnosis not present

## 2017-06-25 DIAGNOSIS — Z86711 Personal history of pulmonary embolism: Secondary | ICD-10-CM | POA: Diagnosis not present

## 2017-06-25 DIAGNOSIS — Z7951 Long term (current) use of inhaled steroids: Secondary | ICD-10-CM | POA: Diagnosis not present

## 2017-06-25 DIAGNOSIS — I739 Peripheral vascular disease, unspecified: Secondary | ICD-10-CM | POA: Diagnosis not present

## 2017-06-25 DIAGNOSIS — I711 Thoracic aortic aneurysm, ruptured: Secondary | ICD-10-CM | POA: Diagnosis not present

## 2017-06-25 DIAGNOSIS — N186 End stage renal disease: Secondary | ICD-10-CM | POA: Diagnosis not present

## 2017-06-25 DIAGNOSIS — N2581 Secondary hyperparathyroidism of renal origin: Secondary | ICD-10-CM | POA: Diagnosis not present

## 2017-06-25 DIAGNOSIS — J449 Chronic obstructive pulmonary disease, unspecified: Secondary | ICD-10-CM | POA: Diagnosis not present

## 2017-06-25 DIAGNOSIS — I120 Hypertensive chronic kidney disease with stage 5 chronic kidney disease or end stage renal disease: Secondary | ICD-10-CM | POA: Diagnosis not present

## 2017-06-25 HISTORY — DX: Restless legs syndrome: G25.81

## 2017-06-25 HISTORY — PX: AV FISTULA PLACEMENT: SHX1204

## 2017-06-25 HISTORY — DX: Polyneuropathy, unspecified: G62.9

## 2017-06-25 LAB — PROTIME-INR
INR: 1.13
PROTHROMBIN TIME: 14.5 s (ref 11.4–15.2)

## 2017-06-25 LAB — POCT I-STAT 4, (NA,K, GLUC, HGB,HCT)
Glucose, Bld: 100 mg/dL — ABNORMAL HIGH (ref 65–99)
HCT: 34 % — ABNORMAL LOW (ref 39.0–52.0)
Hemoglobin: 11.6 g/dL — ABNORMAL LOW (ref 13.0–17.0)
POTASSIUM: 5.8 mmol/L — AB (ref 3.5–5.1)
SODIUM: 133 mmol/L — AB (ref 135–145)

## 2017-06-25 LAB — APTT: APTT: 39 s — AB (ref 24–36)

## 2017-06-25 SURGERY — ARTERIOVENOUS (AV) FISTULA CREATION
Anesthesia: Monitor Anesthesia Care | Site: Arm Lower | Laterality: Left

## 2017-06-25 MED ORDER — PROPOFOL 500 MG/50ML IV EMUL
INTRAVENOUS | Status: DC | PRN
  Administered 2017-06-25: 125 ug/kg/min via INTRAVENOUS

## 2017-06-25 MED ORDER — LIDOCAINE-EPINEPHRINE (PF) 1 %-1:200000 IJ SOLN
INTRAMUSCULAR | Status: DC | PRN
  Administered 2017-06-25: 3.5 mL

## 2017-06-25 MED ORDER — FENTANYL CITRATE (PF) 250 MCG/5ML IJ SOLN
INTRAMUSCULAR | Status: AC
  Filled 2017-06-25: qty 5

## 2017-06-25 MED ORDER — SODIUM CHLORIDE 0.9 % IV SOLN
INTRAVENOUS | Status: DC | PRN
  Administered 2017-06-25: 500 mL

## 2017-06-25 MED ORDER — MIDAZOLAM HCL 5 MG/5ML IJ SOLN
INTRAMUSCULAR | Status: DC | PRN
  Administered 2017-06-25: 2 mg via INTRAVENOUS

## 2017-06-25 MED ORDER — SODIUM CHLORIDE 0.9 % IV SOLN
INTRAVENOUS | Status: DC
  Administered 2017-06-25: 25 mL/h via INTRAVENOUS

## 2017-06-25 MED ORDER — PROPOFOL 1000 MG/100ML IV EMUL
INTRAVENOUS | Status: AC
Start: 2017-06-25 — End: 2017-06-25
  Filled 2017-06-25: qty 100

## 2017-06-25 MED ORDER — DEXTROSE 5 % IV SOLN
INTRAVENOUS | Status: AC
  Filled 2017-06-25: qty 1.5

## 2017-06-25 MED ORDER — KETAMINE HCL-SODIUM CHLORIDE 100-0.9 MG/10ML-% IV SOSY
PREFILLED_SYRINGE | INTRAVENOUS | Status: AC
  Filled 2017-06-25: qty 10

## 2017-06-25 MED ORDER — PROPOFOL 10 MG/ML IV BOLUS
INTRAVENOUS | Status: DC | PRN
  Administered 2017-06-25 (×2): 50 mg via INTRAVENOUS

## 2017-06-25 MED ORDER — MIDAZOLAM HCL 2 MG/2ML IJ SOLN
INTRAMUSCULAR | Status: AC
  Filled 2017-06-25: qty 2

## 2017-06-25 MED ORDER — KETAMINE HCL 10 MG/ML IJ SOLN
INTRAMUSCULAR | Status: DC | PRN
  Administered 2017-06-25: 30 mg via INTRAVENOUS

## 2017-06-25 MED ORDER — PROPOFOL 10 MG/ML IV BOLUS
INTRAVENOUS | Status: AC
Start: 2017-06-25 — End: 2017-06-25
  Filled 2017-06-25: qty 20

## 2017-06-25 MED ORDER — LIDOCAINE-EPINEPHRINE 0.5 %-1:200000 IJ SOLN
INTRAMUSCULAR | Status: AC
  Filled 2017-06-25: qty 1

## 2017-06-25 MED ORDER — ONDANSETRON HCL 4 MG/2ML IJ SOLN
INTRAMUSCULAR | Status: AC
  Filled 2017-06-25: qty 2

## 2017-06-25 MED ORDER — DEXTROSE 5 % IV SOLN
1.5000 g | INTRAVENOUS | Status: AC
  Administered 2017-06-25: 1.5 g via INTRAVENOUS

## 2017-06-25 MED ORDER — FENTANYL CITRATE (PF) 100 MCG/2ML IJ SOLN
INTRAMUSCULAR | Status: DC | PRN
  Administered 2017-06-25 (×3): 25 ug via INTRAVENOUS

## 2017-06-25 MED ORDER — ONDANSETRON HCL 4 MG/2ML IJ SOLN
INTRAMUSCULAR | Status: DC | PRN
  Administered 2017-06-25: 4 mg via INTRAVENOUS

## 2017-06-25 MED ORDER — 0.9 % SODIUM CHLORIDE (POUR BTL) OPTIME
TOPICAL | Status: DC | PRN
  Administered 2017-06-25: 1000 mL

## 2017-06-25 MED ORDER — SODIUM CHLORIDE 0.9 % IV SOLN
INTRAVENOUS | Status: DC | PRN
  Administered 2017-06-25: 13:00:00 via INTRAVENOUS

## 2017-06-25 MED ORDER — CHLORHEXIDINE GLUCONATE CLOTH 2 % EX PADS: 6.0000 | MEDICATED_PAD | Freq: Once | CUTANEOUS | Status: DC

## 2017-06-25 MED ORDER — SODIUM CHLORIDE 0.9 % IV SOLN
INTRAVENOUS | Status: DC
  Administered 2017-06-25: 35 mL/h via INTRAVENOUS

## 2017-06-25 MED ORDER — PHENYLEPHRINE HCL 10 MG/ML IJ SOLN
INTRAMUSCULAR | Status: DC | PRN
  Administered 2017-06-25: 80 ug via INTRAVENOUS
  Administered 2017-06-25: 120 ug via INTRAVENOUS

## 2017-06-25 SURGICAL SUPPLY — 30 items
ADH SKN CLS APL DERMABOND .7 (GAUZE/BANDAGES/DRESSINGS) ×1
ARMBAND PINK RESTRICT EXTREMIT (MISCELLANEOUS) ×6 IMPLANT
CANISTER SUCT 3000ML PPV (MISCELLANEOUS) ×3 IMPLANT
CANNULA VESSEL 3MM 2 BLNT TIP (CANNULA) ×3 IMPLANT
CLIP LIGATING EXTRA MED SLVR (CLIP) ×3 IMPLANT
CLIP LIGATING EXTRA SM BLUE (MISCELLANEOUS) ×3 IMPLANT
COVER PROBE W GEL 5X96 (DRAPES) ×3 IMPLANT
DECANTER SPIKE VIAL GLASS SM (MISCELLANEOUS) ×3 IMPLANT
DERMABOND ADVANCED (GAUZE/BANDAGES/DRESSINGS) ×2
DERMABOND ADVANCED .7 DNX12 (GAUZE/BANDAGES/DRESSINGS) ×1 IMPLANT
ELECT REM PT RETURN 9FT ADLT (ELECTROSURGICAL) ×3
ELECTRODE REM PT RTRN 9FT ADLT (ELECTROSURGICAL) ×1 IMPLANT
GLOVE BIOGEL PI IND STRL 6.5 (GLOVE) IMPLANT
GLOVE BIOGEL PI INDICATOR 6.5 (GLOVE) ×8
GLOVE ECLIPSE 6.5 STRL STRAW (GLOVE) ×2 IMPLANT
GLOVE ECLIPSE 7.0 STRL STRAW (GLOVE) ×2 IMPLANT
GLOVE SS BIOGEL STRL SZ 7.5 (GLOVE) ×1 IMPLANT
GLOVE SUPERSENSE BIOGEL SZ 7.5 (GLOVE) ×2
GOWN STRL REUS W/ TWL LRG LVL3 (GOWN DISPOSABLE) ×3 IMPLANT
GOWN STRL REUS W/TWL LRG LVL3 (GOWN DISPOSABLE) ×9
KIT BASIN OR (CUSTOM PROCEDURE TRAY) ×3 IMPLANT
KIT ROOM TURNOVER OR (KITS) ×3 IMPLANT
NS IRRIG 1000ML POUR BTL (IV SOLUTION) ×3 IMPLANT
PACK CV ACCESS (CUSTOM PROCEDURE TRAY) ×3 IMPLANT
PAD ARMBOARD 7.5X6 YLW CONV (MISCELLANEOUS) ×6 IMPLANT
SUT PROLENE 6 0 CC (SUTURE) ×3 IMPLANT
SUT VIC AB 3-0 SH 27 (SUTURE) ×3
SUT VIC AB 3-0 SH 27X BRD (SUTURE) ×1 IMPLANT
UNDERPAD 30X30 (UNDERPADS AND DIAPERS) ×3 IMPLANT
WATER STERILE IRR 1000ML POUR (IV SOLUTION) ×3 IMPLANT

## 2017-06-25 NOTE — Progress Notes (Signed)
    Patient actively moving all extremities No tongue deviation answering question appropriately Alert and oriented No tongue deviation Right neck incision without hematoma.   S/P right CEA  Keldrick Pomplun MAUREEN PA-C

## 2017-06-25 NOTE — Anesthesia Preprocedure Evaluation (Addendum)
Anesthesia Evaluation  Patient identified by MRN, date of birth, ID band Patient awake    Reviewed: Allergy & Precautions, H&P , NPO status , Patient's Chart, lab work & pertinent test results  Airway Mallampati: II  TM Distance: >3 FB Neck ROM: Full    Dental no notable dental hx. (+) Edentulous Upper, Edentulous Lower, Dental Advisory Given   Pulmonary COPD,  COPD inhaler, Current Smoker,    Pulmonary exam normal breath sounds clear to auscultation       Cardiovascular hypertension, Pt. on medications + Peripheral Vascular Disease   Rhythm:Regular Rate:Normal     Neuro/Psych Anxiety Depression negative neurological ROS     GI/Hepatic Neg liver ROS, GERD  ,  Endo/Other  negative endocrine ROS  Renal/GU ESRF and DialysisRenal disease  negative genitourinary   Musculoskeletal   Abdominal   Peds  Hematology  (+) anemia , HIV,   Anesthesia Other Findings   Reproductive/Obstetrics negative OB ROS                            Anesthesia Physical Anesthesia Plan  ASA: III  Anesthesia Plan: MAC   Post-op Pain Management:    Induction: Intravenous  PONV Risk Score and Plan: 1 and Ondansetron, Propofol and Midazolam  Airway Management Planned: Simple Face Mask  Additional Equipment:   Intra-op Plan:   Post-operative Plan:   Informed Consent: I have reviewed the patients History and Physical, chart, labs and discussed the procedure including the risks, benefits and alternatives for the proposed anesthesia with the patient or authorized representative who has indicated his/her understanding and acceptance.   Dental advisory given  Plan Discussed with: CRNA  Anesthesia Plan Comments:         Anesthesia Quick Evaluation

## 2017-06-25 NOTE — Anesthesia Postprocedure Evaluation (Signed)
Anesthesia Post Note  Patient: Brian [name]  Procedure(s) Performed: Procedure(s) (LRB): Left arm ARTERIOVENOUS  FISTULA CREATION- (Left)     Patient location during evaluation: PACU Anesthesia Type: MAC Level of consciousness: awake and alert Pain management: pain level controlled Vital Signs Assessment: post-procedure vital signs reviewed and stable Respiratory status: spontaneous breathing, nonlabored ventilation and respiratory function stable Cardiovascular status: stable and blood pressure returned to baseline Anesthetic complications: no    Last Vitals:  Vitals:   06/25/17 1535 06/25/17 1545  BP: 129/71   Pulse: 81 90  Resp: 13 13  Temp: 36.7 C     Last Pain: There were no vitals filed for this visit.               Calirose Mccance,W. EDMOND
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
## 2017-06-25 NOTE — Op Note (Signed)
    OPERATIVE REPORT  DATE OF SURGERY: 06/25/2017  PATIENT: Brian [name], 41 y.o. male MRN: [id number]  DOB: [birth year]  PRE-OPERATIVE DIAGNOSIS: End-stage renal disease  POST-OPERATIVE DIAGNOSIS:  Same  PROCEDURE: Left radiocephalic AV fistula  SURGEON:  Curt Jews, M.D.  PHYSICIAN ASSISTANT: Gerri Lins PA-C  ANESTHESIA:  Local with sedation  EBL: Minimal ml  Total I/O In: -  Out: 10 [Blood:10]  BLOOD ADMINISTERED: None  DRAINS: None  SPECIMEN: None  COUNTS CORRECT:  YES  PLAN OF CARE: PACU   PATIENT DISPOSITION:  PACU - hemodynamically stable  PROCEDURE DETAILS: The patient was taken to the operative placed was very RFL for continue sterile fashion. Using local anesthesia incision was made between the level of the cephalic vein and the radial artery at the wrist. Cephalic vein was of large caliber. Curvature branches were ligated with 3-0 silk ties and divided. The vein was ligated distally with a 2-0 silk ties and divided. The vein was brought into approximation of the radial artery which was also of large size. The artery was occluded proximally and distally and was opened with an 11 blade some long-standing with Potts scissors. The vein was cut to appropriate length and was spatulated and sewn end-to-side to the artery with a running 6-0 Prolene suture. Clamps removed and excellent thrill was noted. The wound irrigated with saline. Hemostasis tablet cautery. Wounds were closed with 3-0 Vicryl in the subcutaneous and subcuticular tissue. Sterile dressing was applied the patient was transferred to the recovery room stable condition   Brian [name], M.D., [facility] 06/25/2017 2:35 PM

## 2017-06-25 NOTE — Anesthesia Procedure Notes (Signed)
Procedure Name: MAC Date/Time: 06/25/2017 1:52 PM Performed by: Teressa Lower Pre-anesthesia Checklist: Patient identified, Emergency Drugs available, Suction available, Patient being monitored and Timeout performed Patient Re-evaluated:Patient Re-evaluated prior to induction Oxygen Delivery Method: Simple face mask

## 2017-06-25 NOTE — Transfer of Care (Signed)
Immediate Anesthesia Transfer of Care Note  Patient: Brian [name]  Procedure(s) Performed: Procedure(s): Left arm ARTERIOVENOUS  FISTULA CREATION- (Left)  Patient Location: PACU  Anesthesia Type:MAC  Level of Consciousness: drowsy  Airway & Oxygen Therapy: Patient Spontanous Breathing and Patient connected to face mask oxygen  Post-op Assessment: Report given to RN and Post -op Vital signs reviewed and stable  Post vital signs: Reviewed and stable  Last Vitals:  Vitals:   06/25/17 1308  BP: 134/79  Pulse: 98  Resp: 20  Temp: 36.6 C    Last Pain: There were no vitals filed for this visit.       Complications: No apparent anesthesia complications

## 2017-06-26 ENCOUNTER — Encounter (HOSPITAL_COMMUNITY): Payer: Self-pay | Admitting: Vascular Surgery

## 2017-06-26 ENCOUNTER — Telehealth: Payer: Self-pay | Admitting: Vascular Surgery

## 2017-06-26 NOTE — Telephone Encounter (Signed)
Sched appt 08/05/17; lab at 9:00 and MD at 10:15. Lm on hm#.

## 2017-06-26 NOTE — Telephone Encounter (Signed)
-----   Message from Mena Goes, RN sent at 06/25/2017  5:25 PM EDT ----- Regarding: 6 weeks w/ duplex   ----- Message ----- From: Ulyses Amor, PA-C Sent: 06/25/2017   2:25 PM To: Vvs Charge Pool  F/U with DR. Early in 6 weeks s/p left forearm av fistula creation needs duplex.

## 2017-06-27 DIAGNOSIS — N186 End stage renal disease: Secondary | ICD-10-CM | POA: Diagnosis not present

## 2017-06-27 DIAGNOSIS — D631 Anemia in chronic kidney disease: Secondary | ICD-10-CM | POA: Diagnosis not present

## 2017-06-27 DIAGNOSIS — N2581 Secondary hyperparathyroidism of renal origin: Secondary | ICD-10-CM | POA: Diagnosis not present

## 2017-06-27 DIAGNOSIS — D509 Iron deficiency anemia, unspecified: Secondary | ICD-10-CM | POA: Diagnosis not present

## 2017-06-30 ENCOUNTER — Ambulatory Visit: Payer: Self-pay

## 2017-06-30 DIAGNOSIS — N2581 Secondary hyperparathyroidism of renal origin: Secondary | ICD-10-CM | POA: Diagnosis not present

## 2017-06-30 DIAGNOSIS — D509 Iron deficiency anemia, unspecified: Secondary | ICD-10-CM | POA: Diagnosis not present

## 2017-06-30 DIAGNOSIS — D631 Anemia in chronic kidney disease: Secondary | ICD-10-CM | POA: Diagnosis not present

## 2017-06-30 DIAGNOSIS — N186 End stage renal disease: Secondary | ICD-10-CM | POA: Diagnosis not present

## 2017-07-02 DIAGNOSIS — N186 End stage renal disease: Secondary | ICD-10-CM | POA: Diagnosis not present

## 2017-07-02 DIAGNOSIS — D509 Iron deficiency anemia, unspecified: Secondary | ICD-10-CM | POA: Diagnosis not present

## 2017-07-02 DIAGNOSIS — D631 Anemia in chronic kidney disease: Secondary | ICD-10-CM | POA: Diagnosis not present

## 2017-07-02 DIAGNOSIS — N2581 Secondary hyperparathyroidism of renal origin: Secondary | ICD-10-CM | POA: Diagnosis not present

## 2017-07-03 ENCOUNTER — Ambulatory Visit (INDEPENDENT_AMBULATORY_CARE_PROVIDER_SITE_OTHER): Payer: Medicare Other | Admitting: Pharmacist

## 2017-07-03 ENCOUNTER — Ambulatory Visit: Payer: Self-pay | Admitting: Pharmacist

## 2017-07-03 DIAGNOSIS — Z7901 Long term (current) use of anticoagulants: Secondary | ICD-10-CM | POA: Diagnosis not present

## 2017-07-03 DIAGNOSIS — Z86718 Personal history of other venous thrombosis and embolism: Secondary | ICD-10-CM

## 2017-07-03 LAB — POCT INR: INR: 1.2

## 2017-07-03 MED ORDER — WARFARIN SODIUM 5 MG PO TABS: ORAL_TABLET | ORAL | 3 refills | Status: DC

## 2017-07-03 NOTE — Progress Notes (Signed)
Anticoagulation Management Brian [name] is a 42 y.o. male who reports to the clinic for monitoring of warfarin treatment.    Indication: DVT , history of; PE, history of; long term use of anticoagulants. Duration: indefinite Supervising physician: Larey Dresser  Anticoagulation Clinic Visit History: Patient states he has taken warfarin x 6 days due to fistula placement last week, no enoxaparin bridge, does not report signs/symptoms of bleeding or thromboembolism  Other recent changes: No diet, medications, lifestyle changes endorsed by the patient.   Anticoagulation Episode Summary    Current INR goal:   2.0-3.0  TTR:   33.3 % (1.4 y)  Next INR check:   07/10/2017  INR from last check:   1.2! (07/03/2017)  Weekly max warfarin dose:     Target end date:   Indefinite  INR check location:     Preferred lab:     Send INR reminders to:      Indications   Deep vein thrombosis (HCC) (Resolved) [I82.409] History of deep vein thrombosis (Resolved) [Z86.718] PULMONARY EMBOLISM (Resolved) [I26.99] PULMONARY EMBOLISM (Resolved) [I26.99]       Comments:          ASSESSMENT Recent Results: Lab Results  Component Value Date   INR 1.2 07/03/2017   INR 1.13 06/25/2017   INR 1.10 06/23/2017   Anticoagulation Dosing: INR as of 07/03/2017 and Previous Warfarin Dosing Information    INR Dt INR Goal [employer] Sun Mon Tue Wed Thu Fri Sat   07/03/2017 1.2 2.0-3.0 25 mg 5 mg 0 mg 0 mg 0 mg 7.5 mg 5 mg 7.5 mg    Previous description   OMIT/HOLD (Do NOT take warfarin) on Monday July 9; Tuesday July 10, and Wednesday June 25, 2017. RESUME warfarin when instructed by [facility] Vascular and Vein surgeons (typically, 24 hours after a procedure--but follow their instructions). WHEN you resume warfarin take:  1&1/2 tablets of your 5mg  peach-colored warfarin tablets on Thursdays, and Saturdays; on Friday and Sunday--take only ONE (1) tablet of your 5mg  peach-colored warfarin tablets. Return to  clinic on Monday, July 16 at 4:30PM for repeat INR.    Anticoagulation Warfarin Dose Instructions as of 07/03/2017      Total Sun Mon Tue Wed Thu Fri Sat   New Dose 52.5 mg 7.5 mg 7.5 mg 7.5 mg 7.5 mg 7.5 mg 7.5 mg 7.5 mg     (5 mg x 1.5)  (5 mg x 1.5)  (5 mg x 1.5)  (5 mg x 1.5)  (5 mg x 1.5)  (5 mg x 1.5)  (5 mg x 1.5)                           INR today: Subtherapeutic  PLAN Weekly dose was increased by 17% to 50 mg per week  There are no Patient Instructions on file for this visit. Patient advised to contact clinic or seek medical attention if signs/symptoms of bleeding or thromboembolism occur.  Patient verbalized understanding by repeating back information and was advised to contact me if further medication-related questions arise. Patient was also provided an information handout.  Follow-up No Follow-up on file.  [name]

## 2017-07-04 DIAGNOSIS — D509 Iron deficiency anemia, unspecified: Secondary | ICD-10-CM | POA: Diagnosis not present

## 2017-07-04 DIAGNOSIS — N2581 Secondary hyperparathyroidism of renal origin: Secondary | ICD-10-CM | POA: Diagnosis not present

## 2017-07-04 DIAGNOSIS — D631 Anemia in chronic kidney disease: Secondary | ICD-10-CM | POA: Diagnosis not present

## 2017-07-04 DIAGNOSIS — N186 End stage renal disease: Secondary | ICD-10-CM | POA: Diagnosis not present

## 2017-07-07 DIAGNOSIS — D631 Anemia in chronic kidney disease: Secondary | ICD-10-CM | POA: Diagnosis not present

## 2017-07-07 DIAGNOSIS — N2581 Secondary hyperparathyroidism of renal origin: Secondary | ICD-10-CM | POA: Diagnosis not present

## 2017-07-07 DIAGNOSIS — N186 End stage renal disease: Secondary | ICD-10-CM | POA: Diagnosis not present

## 2017-07-07 DIAGNOSIS — D509 Iron deficiency anemia, unspecified: Secondary | ICD-10-CM | POA: Diagnosis not present

## 2017-07-09 DIAGNOSIS — N2581 Secondary hyperparathyroidism of renal origin: Secondary | ICD-10-CM | POA: Diagnosis not present

## 2017-07-09 DIAGNOSIS — D509 Iron deficiency anemia, unspecified: Secondary | ICD-10-CM | POA: Diagnosis not present

## 2017-07-09 DIAGNOSIS — N186 End stage renal disease: Secondary | ICD-10-CM | POA: Diagnosis not present

## 2017-07-09 DIAGNOSIS — D631 Anemia in chronic kidney disease: Secondary | ICD-10-CM | POA: Diagnosis not present

## 2017-07-10 ENCOUNTER — Ambulatory Visit (INDEPENDENT_AMBULATORY_CARE_PROVIDER_SITE_OTHER): Payer: Medicare Other | Admitting: Pharmacist

## 2017-07-10 DIAGNOSIS — Z86718 Personal history of other venous thrombosis and embolism: Secondary | ICD-10-CM | POA: Diagnosis not present

## 2017-07-10 DIAGNOSIS — Z7901 Long term (current) use of anticoagulants: Secondary | ICD-10-CM | POA: Diagnosis not present

## 2017-07-10 DIAGNOSIS — I2699 Other pulmonary embolism without acute cor pulmonale: Secondary | ICD-10-CM

## 2017-07-10 DIAGNOSIS — Z86711 Personal history of pulmonary embolism: Secondary | ICD-10-CM

## 2017-07-10 LAB — POCT INR: INR: 1.3

## 2017-07-10 NOTE — Patient Instructions (Signed)
Patient educated about medication as defined in this encounter and verbalized understanding by repeating back instructions provided.   

## 2017-07-10 NOTE — Progress Notes (Signed)
Anticoagulation Management Brian [name] a 41 y.o.malewho reports to the clinic for monitoring of warfarintreatment.   Indication: DVT, history of; PE, history of; long term use of anticoagulants. Duration: indefinite Supervising physician: Murriel Hopper  Anticoagulation Clinic Visit History: Patient does not report signs/symptoms of bleeding or thromboembolism   Anticoagulation Episode Summary    Current INR goal:   2.0-3.0  TTR:   32.9 % (1.4 y)  Next INR check:   07/17/2017  INR from last check:   1.3! (07/10/2017)  Weekly max warfarin dose:     Target end date:   Indefinite  INR check location:     Preferred lab:     Send INR reminders to:      Indications   Deep vein thrombosis (HCC) (Resolved) [I82.409] History of deep vein thrombosis (Resolved) [Z86.718] PULMONARY EMBOLISM (Resolved) [I26.99] PULMONARY EMBOLISM (Resolved) [I26.99]       Comments:          ASSESSMENT Recent Results: Lab Results  Component Value Date   INR 1.3 07/10/2017   INR 1.2 07/03/2017   INR 1.13 06/25/2017   Anticoagulation Dosing: INR as of 07/10/2017 and Previous Warfarin Dosing Information    INR Dt INR Goal [employer] Sun Mon Tue Wed Thu Fri Sat   07/10/2017 1.3 2.0-3.0 52.5 mg 7.5 mg 7.5 mg 7.5 mg 7.5 mg 7.5 mg 7.5 mg 7.5 mg    Anticoagulation Warfarin Dose Instructions as of 07/10/2017      Total Sun Mon Tue Wed Thu Fri Sat   New Dose 60 mg 7.5 mg 10 mg 7.5 mg 10 mg 7.5 mg 10 mg 7.5 mg     (5 mg x 1.5)  (5 mg x 2)  (5 mg x 1.5)  (5 mg x 2)  (5 mg x 1.5)  (5 mg x 2)  (5 mg x 1.5)                           INR today: Subtherapeutic  PLAN Weekly dose was increased by 14% to 60 mg per week  Patient Instructions  Patient educated about medication as defined in this encounter and verbalized understanding by repeating back instructions provided.   Patient advised to contact clinic or seek medical attention if signs/symptoms of bleeding or thromboembolism  occur.  Patient verbalized understanding by repeating back information and was advised to contact me if further medication-related questions arise. Patient was also provided an information handout.  Follow-up Return in about 1 week (around 07/17/2017).  Brian [name]

## 2017-07-11 DIAGNOSIS — D509 Iron deficiency anemia, unspecified: Secondary | ICD-10-CM | POA: Diagnosis not present

## 2017-07-11 DIAGNOSIS — D631 Anemia in chronic kidney disease: Secondary | ICD-10-CM | POA: Diagnosis not present

## 2017-07-11 DIAGNOSIS — N186 End stage renal disease: Secondary | ICD-10-CM | POA: Diagnosis not present

## 2017-07-11 DIAGNOSIS — N2581 Secondary hyperparathyroidism of renal origin: Secondary | ICD-10-CM | POA: Diagnosis not present

## 2017-07-14 DIAGNOSIS — N2581 Secondary hyperparathyroidism of renal origin: Secondary | ICD-10-CM | POA: Diagnosis not present

## 2017-07-14 DIAGNOSIS — N186 End stage renal disease: Secondary | ICD-10-CM | POA: Diagnosis not present

## 2017-07-14 DIAGNOSIS — D631 Anemia in chronic kidney disease: Secondary | ICD-10-CM | POA: Diagnosis not present

## 2017-07-14 DIAGNOSIS — D509 Iron deficiency anemia, unspecified: Secondary | ICD-10-CM | POA: Diagnosis not present

## 2017-07-15 DIAGNOSIS — I120 Hypertensive chronic kidney disease with stage 5 chronic kidney disease or end stage renal disease: Secondary | ICD-10-CM | POA: Diagnosis not present

## 2017-07-15 DIAGNOSIS — N186 End stage renal disease: Secondary | ICD-10-CM | POA: Diagnosis not present

## 2017-07-15 DIAGNOSIS — Z992 Dependence on renal dialysis: Secondary | ICD-10-CM | POA: Diagnosis not present

## 2017-07-16 DIAGNOSIS — R17 Unspecified jaundice: Secondary | ICD-10-CM | POA: Diagnosis not present

## 2017-07-16 DIAGNOSIS — N2581 Secondary hyperparathyroidism of renal origin: Secondary | ICD-10-CM | POA: Diagnosis not present

## 2017-07-16 DIAGNOSIS — N186 End stage renal disease: Secondary | ICD-10-CM | POA: Diagnosis not present

## 2017-07-16 DIAGNOSIS — D509 Iron deficiency anemia, unspecified: Secondary | ICD-10-CM | POA: Diagnosis not present

## 2017-07-17 ENCOUNTER — Ambulatory Visit: Payer: Medicare Other | Admitting: Pharmacist

## 2017-07-18 DIAGNOSIS — D509 Iron deficiency anemia, unspecified: Secondary | ICD-10-CM | POA: Diagnosis not present

## 2017-07-18 DIAGNOSIS — R17 Unspecified jaundice: Secondary | ICD-10-CM | POA: Diagnosis not present

## 2017-07-18 DIAGNOSIS — N2581 Secondary hyperparathyroidism of renal origin: Secondary | ICD-10-CM | POA: Diagnosis not present

## 2017-07-18 DIAGNOSIS — N186 End stage renal disease: Secondary | ICD-10-CM | POA: Diagnosis not present

## 2017-07-21 DIAGNOSIS — N186 End stage renal disease: Secondary | ICD-10-CM | POA: Diagnosis not present

## 2017-07-21 DIAGNOSIS — R17 Unspecified jaundice: Secondary | ICD-10-CM | POA: Diagnosis not present

## 2017-07-21 DIAGNOSIS — D509 Iron deficiency anemia, unspecified: Secondary | ICD-10-CM | POA: Diagnosis not present

## 2017-07-21 DIAGNOSIS — N2581 Secondary hyperparathyroidism of renal origin: Secondary | ICD-10-CM | POA: Diagnosis not present

## 2017-07-22 ENCOUNTER — Telehealth: Payer: Self-pay | Admitting: *Deleted

## 2017-07-22 ENCOUNTER — Other Ambulatory Visit: Payer: Self-pay | Admitting: Internal Medicine

## 2017-07-22 ENCOUNTER — Telehealth: Payer: Self-pay | Admitting: Pharmacist Clinician (PhC)/ Clinical Pharmacy Specialist

## 2017-07-22 DIAGNOSIS — B20 Human immunodeficiency virus [HIV] disease: Secondary | ICD-10-CM

## 2017-07-22 NOTE — Telephone Encounter (Signed)
Renee' at [facility] called to report that patient's left radiocephalic AVF has no thrill. They have been sticking his original brachiocephalic AVF for many visits with no difficulty. She also stated that the patient was not compliant with his Coumadin dosages and this was felt to be the reason the radiocephalic AVF has gone down. I asked [name] and he said that he did not want to pursue this AVF now since the brachiocephalic was doing well. Joseph Art' agreed and stated that she would let Dr. Jimmy Footman be aware. If the patient requires an alternate HD access, the [facility] will contact us.

## 2017-07-22 NOTE — Telephone Encounter (Signed)
Left a VM to call back for an appt. Has not been seen since 2017. Rx is asking for refills.

## 2017-07-23 DIAGNOSIS — D509 Iron deficiency anemia, unspecified: Secondary | ICD-10-CM | POA: Diagnosis not present

## 2017-07-23 DIAGNOSIS — N2581 Secondary hyperparathyroidism of renal origin: Secondary | ICD-10-CM | POA: Diagnosis not present

## 2017-07-23 DIAGNOSIS — R17 Unspecified jaundice: Secondary | ICD-10-CM | POA: Diagnosis not present

## 2017-07-23 DIAGNOSIS — N186 End stage renal disease: Secondary | ICD-10-CM | POA: Diagnosis not present

## 2017-07-25 ENCOUNTER — Other Ambulatory Visit: Payer: Self-pay

## 2017-07-25 DIAGNOSIS — R17 Unspecified jaundice: Secondary | ICD-10-CM | POA: Diagnosis not present

## 2017-07-25 DIAGNOSIS — N2581 Secondary hyperparathyroidism of renal origin: Secondary | ICD-10-CM | POA: Diagnosis not present

## 2017-07-25 DIAGNOSIS — Z4931 Encounter for adequacy testing for hemodialysis: Secondary | ICD-10-CM

## 2017-07-25 DIAGNOSIS — N186 End stage renal disease: Secondary | ICD-10-CM | POA: Diagnosis not present

## 2017-07-25 DIAGNOSIS — D509 Iron deficiency anemia, unspecified: Secondary | ICD-10-CM | POA: Diagnosis not present

## 2017-07-28 ENCOUNTER — Encounter: Payer: Self-pay | Admitting: Vascular Surgery

## 2017-07-28 DIAGNOSIS — R17 Unspecified jaundice: Secondary | ICD-10-CM | POA: Diagnosis not present

## 2017-07-28 DIAGNOSIS — N186 End stage renal disease: Secondary | ICD-10-CM | POA: Diagnosis not present

## 2017-07-28 DIAGNOSIS — N2581 Secondary hyperparathyroidism of renal origin: Secondary | ICD-10-CM | POA: Diagnosis not present

## 2017-07-28 DIAGNOSIS — D509 Iron deficiency anemia, unspecified: Secondary | ICD-10-CM | POA: Diagnosis not present

## 2017-07-29 ENCOUNTER — Ambulatory Visit (INDEPENDENT_AMBULATORY_CARE_PROVIDER_SITE_OTHER): Payer: Medicare Other | Admitting: Pharmacist

## 2017-07-29 DIAGNOSIS — B20 Human immunodeficiency virus [HIV] disease: Secondary | ICD-10-CM

## 2017-07-29 DIAGNOSIS — I825Y9 Chronic embolism and thrombosis of unspecified deep veins of unspecified proximal lower extremity: Secondary | ICD-10-CM

## 2017-07-29 DIAGNOSIS — F17210 Nicotine dependence, cigarettes, uncomplicated: Secondary | ICD-10-CM

## 2017-07-29 DIAGNOSIS — Z7901 Long term (current) use of anticoagulants: Secondary | ICD-10-CM | POA: Diagnosis not present

## 2017-07-29 LAB — CBC
HCT: 39 % (ref 38.5–50.0)
HEMOGLOBIN: 13.4 g/dL (ref 13.2–17.1)
MCH: 34.3 pg — ABNORMAL HIGH (ref 27.0–33.0)
MCHC: 34.4 g/dL (ref 32.0–36.0)
MCV: 99.7 fL (ref 80.0–100.0)
MPV: 10.9 fL (ref 7.5–12.5)
Platelets: 159 10*3/uL (ref 140–400)
RBC: 3.91 MIL/uL — AB (ref 4.20–5.80)
RDW: 16.4 % — ABNORMAL HIGH (ref 11.0–15.0)
WBC: 5.2 10*3/uL (ref 3.8–10.8)

## 2017-07-29 LAB — POCT INR: INR: 7.8

## 2017-07-29 MED ORDER — EMTRICITABINE-TENOFOVIR AF 200-25 MG PO TABS: 1.0000 | ORAL_TABLET | Freq: Every day | ORAL | 5 refills | Status: DC

## 2017-07-29 MED ORDER — DOLUTEGRAVIR SODIUM 50 MG PO TABS: 50.0000 mg | ORAL_TABLET | Freq: Every day | ORAL | 5 refills | Status: DC
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
## 2017-07-29 NOTE — Patient Instructions (Signed)
Patient educated about medication as defined in this encounter and verbalized understanding by repeating back instructions provided.   
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
## 2017-07-30 ENCOUNTER — Telehealth: Payer: Self-pay | Admitting: Infectious Diseases

## 2017-07-30 ENCOUNTER — Ambulatory Visit (INDEPENDENT_AMBULATORY_CARE_PROVIDER_SITE_OTHER): Payer: Medicare Other | Admitting: Pharmacist

## 2017-07-30 DIAGNOSIS — N186 End stage renal disease: Secondary | ICD-10-CM | POA: Diagnosis not present

## 2017-07-30 DIAGNOSIS — I824Y9 Acute embolism and thrombosis of unspecified deep veins of unspecified proximal lower extremity: Secondary | ICD-10-CM | POA: Diagnosis present

## 2017-07-30 DIAGNOSIS — N2581 Secondary hyperparathyroidism of renal origin: Secondary | ICD-10-CM | POA: Diagnosis not present

## 2017-07-30 DIAGNOSIS — R17 Unspecified jaundice: Secondary | ICD-10-CM | POA: Diagnosis not present

## 2017-07-30 DIAGNOSIS — D509 Iron deficiency anemia, unspecified: Secondary | ICD-10-CM | POA: Diagnosis not present

## 2017-07-30 DIAGNOSIS — Z7901 Long term (current) use of anticoagulants: Secondary | ICD-10-CM

## 2017-07-30 LAB — COMPREHENSIVE METABOLIC PANEL
ALBUMIN: 4 g/dL (ref 3.6–5.1)
ALK PHOS: 90 U/L (ref 40–115)
ALT: 10 U/L (ref 9–46)
AST: 19 U/L (ref 10–40)
BILIRUBIN TOTAL: 0.6 mg/dL (ref 0.2–1.2)
BUN: 78 mg/dL — ABNORMAL HIGH (ref 7–25)
CO2: 20 mmol/L (ref 20–32)
CREATININE: 10.94 mg/dL — AB (ref 0.60–1.35)
Calcium: 8 mg/dL — ABNORMAL LOW (ref 8.6–10.3)
Chloride: 89 mmol/L — ABNORMAL LOW (ref 98–110)
Glucose, Bld: 80 mg/dL (ref 65–99)
Potassium: 6.3 mmol/L (ref 3.5–5.3)
SODIUM: 132 mmol/L — AB (ref 135–146)
TOTAL PROTEIN: 7.5 g/dL (ref 6.1–8.1)

## 2017-07-30 LAB — POCT INR: INR: 1.8

## 2017-07-30 LAB — RPR
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
## 2017-07-30 NOTE — Telephone Encounter (Signed)
Called for pt's potassium of 6.3 He did not answer phone. Left message for him to call back, he is HD pt.  Needs to f/u with HD.

## 2017-07-30 NOTE — Patient Instructions (Signed)
Patient educated about medication as defined in this encounter and verbalized understanding by repeating back instructions provided.   

## 2017-07-31 ENCOUNTER — Ambulatory Visit: Payer: Self-pay | Admitting: Pharmacist

## 2017-07-31 LAB — HIV-1 RNA QUANT-NO REFLEX-BLD
HIV 1 RNA Quant: <20 copies/mL — AB
HIV-1 RNA Quant, Log: <1.3 Log copies/mL — AB

## 2017-07-31 LAB — T-HELPER CELL (CD4) - (RCID CLINIC ONLY)
CD4 % Helper T Cell: 45 % (ref 33–55)
CD4 T Cell Abs: 800 /uL (ref 400–2700)

## 2017-07-31 NOTE — Progress Notes (Signed)
I reviewed Dr. Kim's note.  

## 2017-08-01 DIAGNOSIS — N2581 Secondary hyperparathyroidism of renal origin: Secondary | ICD-10-CM | POA: Diagnosis not present

## 2017-08-01 DIAGNOSIS — R17 Unspecified jaundice: Secondary | ICD-10-CM | POA: Diagnosis not present

## 2017-08-01 DIAGNOSIS — N186 End stage renal disease: Secondary | ICD-10-CM | POA: Diagnosis not present

## 2017-08-01 DIAGNOSIS — D509 Iron deficiency anemia, unspecified: Secondary | ICD-10-CM | POA: Diagnosis not present

## 2017-08-04 DIAGNOSIS — R17 Unspecified jaundice: Secondary | ICD-10-CM | POA: Diagnosis not present

## 2017-08-04 DIAGNOSIS — D509 Iron deficiency anemia, unspecified: Secondary | ICD-10-CM | POA: Diagnosis not present

## 2017-08-04 DIAGNOSIS — N186 End stage renal disease: Secondary | ICD-10-CM | POA: Diagnosis not present

## 2017-08-04 DIAGNOSIS — N2581 Secondary hyperparathyroidism of renal origin: Secondary | ICD-10-CM | POA: Diagnosis not present

## 2017-08-05 ENCOUNTER — Ambulatory Visit (INDEPENDENT_AMBULATORY_CARE_PROVIDER_SITE_OTHER): Payer: Self-pay | Admitting: Vascular Surgery

## 2017-08-05 ENCOUNTER — Ambulatory Visit (HOSPITAL_COMMUNITY)
Admission: RE | Admit: 2017-08-05 | Discharge: 2017-08-05 | Disposition: A | Discharge status: Home / Self Care | Payer: Medicare Other | Source: Ambulatory Visit | Attending: Vascular Surgery | Admitting: Vascular Surgery

## 2017-08-05 ENCOUNTER — Ambulatory Visit: Payer: Self-pay | Admitting: Pharmacist

## 2017-08-05 ENCOUNTER — Encounter: Payer: Self-pay | Admitting: Vascular Surgery

## 2017-08-05 VITALS — BP 159/110 | HR 93 | Temp 97.6°F | Ht 74.0 in | Wt 201.0 lb

## 2017-08-05 DIAGNOSIS — Z4931 Encounter for adequacy testing for hemodialysis: Secondary | ICD-10-CM | POA: Diagnosis not present

## 2017-08-05 DIAGNOSIS — I82612 Acute embolism and thrombosis of superficial veins of left upper extremity: Secondary | ICD-10-CM | POA: Insufficient documentation

## 2017-08-05 DIAGNOSIS — N186 End stage renal disease: Secondary | ICD-10-CM

## 2017-08-05 DIAGNOSIS — Z992 Dependence on renal dialysis: Secondary | ICD-10-CM

## 2017-08-05 NOTE — Progress Notes (Signed)
Patient name: Brian [name] MRN: [id number] DOB: 19-Feb-1976 Sex: male  REASON FOR VISIT: Follow-up left AV fistula creation 06/25/2017  HPI: Brian [name] is a 41 y.o. male here for follow-up. He continues to use a right upper arm AV fistula. This has extensive aneurysmal change and causing him pain with hemodialysis. He had a new left radiocephalic fistula placed by myself on 06/25/2017 to hopefully allow maturation and then discontinuation of use of the right arm. He did have a nice cephalic vein at the time of surgery. Right this he is suffered thrombosis at the radiocephalic anastomosis. He noted he lost the thrill in this approximately 2 weeks ago.  Current Outpatient Prescriptions  Medication Sig Dispense Refill  . calcium acetate (PHOSLO) 667 MG capsule Take by mouth 3 (three) times daily with meals. *takes 2 capsules with every meal and 1 capsule with snacks    . cetirizine (ZYRTEC) 5 MG tablet Take 1 tablet (5 mg total) by mouth daily. (Patient taking differently: Take 5 mg by mouth daily as needed for allergies. ) 90 tablet 3  . dolutegravir (TIVICAY) 50 MG tablet Take 1 tablet (50 mg total) by mouth daily. 30 tablet 5  . dronabinol (MARINOL) 5 MG capsule Take 1 capsule (5 mg total) by mouth 2 (two) times daily before lunch and supper. Do not fill before 07/11/2017 60 capsule 0  . emtricitabine-tenofovir AF (DESCOVY) 200-25 MG tablet Take 1 tablet by mouth daily. 30 tablet 5  . fluticasone (FLONASE) 50 MCG/ACT nasal spray instill TWO SPRAYS in each nostril DAILY (Patient taking differently: instill TWO SPRAYS in each nostril daily as needed for allergies) 16 g 6  . gabapentin (NEURONTIN) 300 MG capsule TAKE ONE CAPSULE THREE TIMES DAILY 90 capsule 9  . lactulose (CHRONULAC) 10 GM/15ML solution Take 15 g by mouth daily as needed for mild constipation.     [name] oxyCODONE-acetaminophen (PERCOCET) 10-325 MG tablet Take 1 tablet by mouth every 4 (four) hours as  needed for pain. Do not fill until 07/11/2017 (Patient taking differently: Take 1 tablet by mouth every 8 (eight) hours as needed for pain. Do not fill until 07/11/2017) 90 tablet 0  . pramipexole (MIRAPEX) 0.25 MG tablet Take 2 tablets (0.5 mg total) by mouth at bedtime. 180 tablet 3  . PROAIR HFA 108 (90 BASE) MCG/ACT inhaler INHALE 2 PUFFS EVERY 4  HRS AS NEEDED FOR WHEEZING SHORTNESS OF BREATH 8 g 3  . SENSIPAR 30 MG tablet Take 30 mg by mouth daily with supper.     . warfarin (COUMADIN) 5 MG tablet Take 1.5 tablets (7.5 mg) daily 45 tablet 3  . multivitamin (RENA-VIT) TABS tablet Take 1 tablet by mouth at bedtime.     . Naloxone HCl (NARCAN) 4 MG/0.1ML LIQD Place 1 spray into the nose once. Use for overdose and dial 9-1-1. Repeat dose in 2-3 minutes if needed. MEDICAID: [id number] Q Brian [name]) (Patient not taking: Reported on 08/05/2017) 1 each 0  . traZODone (DESYREL) 50 MG tablet      No current facility-administered medications for this visit.      PHYSICAL EXAM: Vitals:   08/05/17 1013  BP: (!) 159/110  Pulse: 93  Temp: 97.6 F (36.4 C)  TempSrc: Oral  SpO2: 97%  Weight: 201 lb (91.2 kg)  Height: 6\' 2"  (1.88 m)    GENERAL: The patient is a well-nourished male, in no acute distress. The vital signs are documented above. Continued patency of his cephalic vein above the anastomosis at  the wrist. Obvious thrombosis of the vein at the wrist.  Duplex today I confirm this. Does have a 3 mm vein above this.  MEDICAL ISSUES: Exam the cephalic vein is well-developed throughout the forearm. I reimage this myself with SonoSite. The vein is widely patent and does drain into the basilic vein at the antecubital space. Patient wishes to pursue trying to maintain patency of this fistula feel this is appropriate since he does have a good size cephalic vein. He does have difficulty with clotting and is on chronic Coumadin. We will hold this for several days and revise and thrombectomize his left  radiocephalic fistula. 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

## 2017-08-06 ENCOUNTER — Encounter (HOSPITAL_COMMUNITY): Payer: Self-pay | Admitting: *Deleted

## 2017-08-06 ENCOUNTER — Other Ambulatory Visit: Payer: Self-pay | Admitting: *Deleted

## 2017-08-06 DIAGNOSIS — R17 Unspecified jaundice: Secondary | ICD-10-CM | POA: Diagnosis not present

## 2017-08-06 DIAGNOSIS — D509 Iron deficiency anemia, unspecified: Secondary | ICD-10-CM | POA: Diagnosis not present

## 2017-08-06 DIAGNOSIS — N186 End stage renal disease: Secondary | ICD-10-CM | POA: Diagnosis not present

## 2017-08-06 DIAGNOSIS — N2581 Secondary hyperparathyroidism of renal origin: Secondary | ICD-10-CM | POA: Diagnosis not present

## 2017-08-06 NOTE — Progress Notes (Signed)
Pt denies any acute cardiopulmonary issues. Pt denies being under the care of a cardiologist. Pt denies having a cardiac cath. Pt denies having a chest x ray and EKG within the last year. Pt stated that last dose of Coumadin was Monday night 08/04/17. Pt made aware to stop taking otc vitamins, fish oil and herbal medications. Do not take any NSAIDs ie: Ibuprofen, Advil, Naproxen (Aleve), Motrin, BC and Goody Powder. Pt verbalized understanding of all pre-op instructions.

## 2017-08-07 NOTE — Progress Notes (Signed)
Pt is aware of surgery date change to 08/29/17

## 2017-08-08 ENCOUNTER — Other Ambulatory Visit: Payer: Self-pay

## 2017-08-08 DIAGNOSIS — R630 Anorexia: Secondary | ICD-10-CM

## 2017-08-08 DIAGNOSIS — Q613 Polycystic kidney, unspecified: Secondary | ICD-10-CM

## 2017-08-08 NOTE — Telephone Encounter (Signed)
Just spoke with patient.  Agreed to an appointment on 08/12/17 at 2:45 pm with Dr. Hetty Ely.

## 2017-08-08 NOTE — Telephone Encounter (Signed)
dronabinol (MARINOL) 5 MG capsule,  oxyCODONE-acetaminophen (PERCOCET) 10-325 MG tablet, REFILL REQUEST.

## 2017-08-09 DIAGNOSIS — R17 Unspecified jaundice: Secondary | ICD-10-CM | POA: Diagnosis not present

## 2017-08-09 DIAGNOSIS — N2581 Secondary hyperparathyroidism of renal origin: Secondary | ICD-10-CM | POA: Diagnosis not present

## 2017-08-09 DIAGNOSIS — D509 Iron deficiency anemia, unspecified: Secondary | ICD-10-CM | POA: Diagnosis not present

## 2017-08-09 DIAGNOSIS — N186 End stage renal disease: Secondary | ICD-10-CM | POA: Diagnosis not present

## 2017-08-11 DIAGNOSIS — N2581 Secondary hyperparathyroidism of renal origin: Secondary | ICD-10-CM | POA: Diagnosis not present

## 2017-08-11 DIAGNOSIS — D509 Iron deficiency anemia, unspecified: Secondary | ICD-10-CM | POA: Diagnosis not present

## 2017-08-11 DIAGNOSIS — N186 End stage renal disease: Secondary | ICD-10-CM | POA: Diagnosis not present

## 2017-08-11 DIAGNOSIS — R17 Unspecified jaundice: Secondary | ICD-10-CM | POA: Diagnosis not present
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
## 2017-08-12 ENCOUNTER — Ambulatory Visit (INDEPENDENT_AMBULATORY_CARE_PROVIDER_SITE_OTHER): Payer: Medicare Other | Admitting: Internal Medicine

## 2017-08-12 ENCOUNTER — Ambulatory Visit: Payer: Medicare Other | Admitting: Pharmacist

## 2017-08-12 ENCOUNTER — Encounter: Payer: Self-pay | Admitting: Internal Medicine

## 2017-08-12 DIAGNOSIS — K469 Unspecified abdominal hernia without obstruction or gangrene: Secondary | ICD-10-CM | POA: Diagnosis not present

## 2017-08-12 DIAGNOSIS — F17210 Nicotine dependence, cigarettes, uncomplicated: Secondary | ICD-10-CM

## 2017-08-12 DIAGNOSIS — G2581 Restless legs syndrome: Secondary | ICD-10-CM | POA: Diagnosis not present

## 2017-08-12 DIAGNOSIS — R011 Cardiac murmur, unspecified: Secondary | ICD-10-CM

## 2017-08-12 DIAGNOSIS — J302 Other seasonal allergic rhinitis: Secondary | ICD-10-CM | POA: Diagnosis not present

## 2017-08-12 DIAGNOSIS — G8929 Other chronic pain: Secondary | ICD-10-CM | POA: Diagnosis present

## 2017-08-12 DIAGNOSIS — Z992 Dependence on renal dialysis: Secondary | ICD-10-CM | POA: Diagnosis not present

## 2017-08-12 DIAGNOSIS — Z79891 Long term (current) use of opiate analgesic: Secondary | ICD-10-CM | POA: Diagnosis not present

## 2017-08-12 DIAGNOSIS — R630 Anorexia: Secondary | ICD-10-CM

## 2017-08-12 DIAGNOSIS — Z79899 Other long term (current) drug therapy: Secondary | ICD-10-CM | POA: Diagnosis not present

## 2017-08-12 DIAGNOSIS — I712 Thoracic aortic aneurysm, without rupture: Secondary | ICD-10-CM | POA: Diagnosis not present

## 2017-08-12 DIAGNOSIS — Q613 Polycystic kidney, unspecified: Secondary | ICD-10-CM

## 2017-08-12 MED ORDER — OXYCODONE-ACETAMINOPHEN 10-325 MG PO TABS
1.0000 | ORAL_TABLET | Freq: Three times a day (TID) | ORAL | 0 refills | Status: DC | PRN
Start: 2017-08-12 — End: 2017-08-12

## 2017-08-12 MED ORDER — DRONABINOL 5 MG PO CAPS: 5.0000 mg | ORAL_CAPSULE | Freq: Two times a day (BID) | ORAL | 0 refills | Status: DC

## 2017-08-12 MED ORDER — OXYCODONE-ACETAMINOPHEN 10-325 MG PO TABS: 1.0000 | ORAL_TABLET | Freq: Three times a day (TID) | ORAL | 0 refills | Status: DC | PRN

## 2017-08-12 MED ORDER — DRONABINOL 5 MG PO CAPS
5.0000 mg | ORAL_CAPSULE | Freq: Two times a day (BID) | ORAL | 0 refills | Status: DC
Start: 2017-08-12 — End: 2017-11-11

## 2017-08-12 NOTE — Patient Instructions (Addendum)
Mr. Hogen,   It was a pleasure to see you today. I have provided you refills for percocet and dronabinol.   Follow these recommendations for foods high in fiber but low in potassium.   Please schedule a follow up appointment with me in 3 months.

## 2017-08-13 DIAGNOSIS — N2581 Secondary hyperparathyroidism of renal origin: Secondary | ICD-10-CM | POA: Diagnosis not present

## 2017-08-13 DIAGNOSIS — R17 Unspecified jaundice: Secondary | ICD-10-CM | POA: Diagnosis not present

## 2017-08-13 DIAGNOSIS — D509 Iron deficiency anemia, unspecified: Secondary | ICD-10-CM | POA: Diagnosis not present

## 2017-08-13 DIAGNOSIS — N186 End stage renal disease: Secondary | ICD-10-CM | POA: Diagnosis not present

## 2017-08-13 MED ORDER — LACTULOSE 10 GM/15ML PO SOLN: 10.0000 g | Freq: Every day | ORAL | 0 refills | Status: DC | PRN

## 2017-08-15 DIAGNOSIS — I120 Hypertensive chronic kidney disease with stage 5 chronic kidney disease or end stage renal disease: Secondary | ICD-10-CM | POA: Diagnosis not present

## 2017-08-15 DIAGNOSIS — N186 End stage renal disease: Secondary | ICD-10-CM | POA: Diagnosis not present

## 2017-08-15 DIAGNOSIS — R17 Unspecified jaundice: Secondary | ICD-10-CM | POA: Diagnosis not present

## 2017-08-15 DIAGNOSIS — N2581 Secondary hyperparathyroidism of renal origin: Secondary | ICD-10-CM | POA: Diagnosis not present

## 2017-08-15 DIAGNOSIS — Z992 Dependence on renal dialysis: Secondary | ICD-10-CM | POA: Diagnosis not present

## 2017-08-15 DIAGNOSIS — D509 Iron deficiency anemia, unspecified: Secondary | ICD-10-CM | POA: Diagnosis not present

## 2017-08-18 DIAGNOSIS — D509 Iron deficiency anemia, unspecified: Secondary | ICD-10-CM | POA: Diagnosis not present

## 2017-08-18 DIAGNOSIS — N186 End stage renal disease: Secondary | ICD-10-CM | POA: Diagnosis not present

## 2017-08-18 DIAGNOSIS — N2581 Secondary hyperparathyroidism of renal origin: Secondary | ICD-10-CM | POA: Diagnosis not present

## 2017-08-18 DIAGNOSIS — E875 Hyperkalemia: Secondary | ICD-10-CM | POA: Diagnosis not present

## 2017-08-20 DIAGNOSIS — N2581 Secondary hyperparathyroidism of renal origin: Secondary | ICD-10-CM | POA: Diagnosis not present

## 2017-08-20 DIAGNOSIS — N186 End stage renal disease: Secondary | ICD-10-CM | POA: Diagnosis not present

## 2017-08-20 DIAGNOSIS — D509 Iron deficiency anemia, unspecified: Secondary | ICD-10-CM | POA: Diagnosis not present

## 2017-08-20 DIAGNOSIS — E875 Hyperkalemia: Secondary | ICD-10-CM | POA: Diagnosis not present

## 2017-08-20 NOTE — Progress Notes (Signed)
Internal Medicine Clinic Attending  Case discussed with Dr. Blum at the time of the visit.  We reviewed the resident's history and exam and pertinent patient test results.  I agree with the assessment, diagnosis, and plan of care documented in the resident's note. 

## 2017-08-21 ENCOUNTER — Encounter: Payer: Self-pay | Admitting: Pharmacist

## 2017-08-21 DIAGNOSIS — Z86718 Personal history of other venous thrombosis and embolism: Secondary | ICD-10-CM

## 2017-08-21 LAB — POCT INR: INR: 1.7

## 2017-08-21 NOTE — Patient Instructions (Signed)
Patient educated about medication as defined in this encounter and verbalized understanding by repeating back instructions provided.   
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
## 2017-08-22 DIAGNOSIS — E875 Hyperkalemia: Secondary | ICD-10-CM | POA: Diagnosis not present

## 2017-08-22 DIAGNOSIS — D509 Iron deficiency anemia, unspecified: Secondary | ICD-10-CM | POA: Diagnosis not present

## 2017-08-22 DIAGNOSIS — N186 End stage renal disease: Secondary | ICD-10-CM | POA: Diagnosis not present

## 2017-08-22 DIAGNOSIS — N2581 Secondary hyperparathyroidism of renal origin: Secondary | ICD-10-CM | POA: Diagnosis not present

## 2017-08-25 DIAGNOSIS — E875 Hyperkalemia: Secondary | ICD-10-CM | POA: Diagnosis not present

## 2017-08-25 DIAGNOSIS — N186 End stage renal disease: Secondary | ICD-10-CM | POA: Diagnosis not present

## 2017-08-25 DIAGNOSIS — D509 Iron deficiency anemia, unspecified: Secondary | ICD-10-CM | POA: Diagnosis not present

## 2017-08-25 DIAGNOSIS — N2581 Secondary hyperparathyroidism of renal origin: Secondary | ICD-10-CM | POA: Diagnosis not present

## 2017-08-25 NOTE — Progress Notes (Signed)
I have reviewed Dr. Julianne Rice note. 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  INR low and coumadin increased.

## 2017-08-27 DIAGNOSIS — N2581 Secondary hyperparathyroidism of renal origin: Secondary | ICD-10-CM | POA: Diagnosis not present

## 2017-08-27 DIAGNOSIS — E875 Hyperkalemia: Secondary | ICD-10-CM | POA: Diagnosis not present

## 2017-08-27 DIAGNOSIS — N186 End stage renal disease: Secondary | ICD-10-CM | POA: Diagnosis not present

## 2017-08-27 DIAGNOSIS — D509 Iron deficiency anemia, unspecified: Secondary | ICD-10-CM | POA: Diagnosis not present

## 2017-08-28 ENCOUNTER — Encounter (HOSPITAL_COMMUNITY): Payer: Self-pay | Admitting: *Deleted

## 2017-08-28 NOTE — Progress Notes (Signed)
Spoke with pt for pre-op call. Pt denies cardiac history, chest pain or sob. Pt states he is not diabetic. Pt expressed concern that his surgery is on his dialysis day and the dialysis center told him they couldn't do dialysis on him tomorrow or Saturday due to the weather. I called Arbie Cookey at Dr. Luther Parody office and made her aware of this. She states she will contact [name] and set pt up for dialysis at the hospital.

## 2017-08-29 ENCOUNTER — Non-Acute Institutional Stay (HOSPITAL_COMMUNITY): Payer: Medicare Other | Admitting: Anesthesiology

## 2017-08-29 ENCOUNTER — Encounter (HOSPITAL_COMMUNITY)
Admission: RE | Disposition: A | Discharge status: Home / Self Care | Payer: Self-pay | Source: Ambulatory Visit | Attending: Vascular Surgery

## 2017-08-29 ENCOUNTER — Encounter (HOSPITAL_COMMUNITY): Payer: Self-pay | Admitting: Anesthesiology

## 2017-08-29 ENCOUNTER — Non-Acute Institutional Stay (HOSPITAL_COMMUNITY)
Admission: RE | Admit: 2017-08-29 | Discharge: 2017-08-29 | Disposition: A | Discharge status: Home / Self Care | Payer: Medicare Other | Source: Ambulatory Visit | Attending: Vascular Surgery | Admitting: Vascular Surgery

## 2017-08-29 DIAGNOSIS — Y832 Surgical operation with anastomosis, bypass or graft as the cause of abnormal reaction of the patient, or of later complication, without mention of misadventure at the time of the procedure: Secondary | ICD-10-CM | POA: Insufficient documentation

## 2017-08-29 DIAGNOSIS — Z992 Dependence on renal dialysis: Secondary | ICD-10-CM | POA: Insufficient documentation

## 2017-08-29 DIAGNOSIS — Z7951 Long term (current) use of inhaled steroids: Secondary | ICD-10-CM | POA: Insufficient documentation

## 2017-08-29 DIAGNOSIS — Z7901 Long term (current) use of anticoagulants: Secondary | ICD-10-CM | POA: Insufficient documentation

## 2017-08-29 DIAGNOSIS — Z9109 Other allergy status, other than to drugs and biological substances: Secondary | ICD-10-CM

## 2017-08-29 DIAGNOSIS — N186 End stage renal disease: Secondary | ICD-10-CM

## 2017-08-29 DIAGNOSIS — E875 Hyperkalemia: Secondary | ICD-10-CM | POA: Diagnosis present

## 2017-08-29 DIAGNOSIS — I120 Hypertensive chronic kidney disease with stage 5 chronic kidney disease or end stage renal disease: Secondary | ICD-10-CM | POA: Diagnosis not present

## 2017-08-29 DIAGNOSIS — Q613 Polycystic kidney, unspecified: Secondary | ICD-10-CM

## 2017-08-29 DIAGNOSIS — T82868A Thrombosis of vascular prosthetic devices, implants and grafts, initial encounter: Secondary | ICD-10-CM | POA: Insufficient documentation

## 2017-08-29 DIAGNOSIS — T82898A Other specified complication of vascular prosthetic devices, implants and grafts, initial encounter: Secondary | ICD-10-CM | POA: Diagnosis not present

## 2017-08-29 HISTORY — PX: BASCILIC VEIN TRANSPOSITION: SHX5742

## 2017-08-29 HISTORY — PX: THROMBECTOMY W/ EMBOLECTOMY: SHX2507

## 2017-08-29 LAB — POCT I-STAT 4, (NA,K, GLUC, HGB,HCT)
Glucose, Bld: 87 mg/dL (ref 65–99)
HCT: 35 % — ABNORMAL LOW (ref 39.0–52.0)
Hemoglobin: 11.9 g/dL — ABNORMAL LOW (ref 13.0–17.0)
POTASSIUM: 6.1 mmol/L — AB (ref 3.5–5.1)
Sodium: 132 mmol/L — ABNORMAL LOW (ref 135–145)

## 2017-08-29 LAB — PROTIME-INR
INR: 1.05
PROTHROMBIN TIME: 13.6 s (ref 11.4–15.2)

## 2017-08-29 SURGERY — THROMBECTOMY ARTERIOVENOUS FISTULA
Anesthesia: General | Site: Arm Lower | Laterality: Left

## 2017-08-29 MED ORDER — LIDOCAINE 2% (20 MG/ML) 5 ML SYRINGE
INTRAMUSCULAR | Status: AC
  Filled 2017-08-29: qty 10

## 2017-08-29 MED ORDER — FENTANYL CITRATE (PF) 250 MCG/5ML IJ SOLN
INTRAMUSCULAR | Status: AC
  Filled 2017-08-29: qty 5

## 2017-08-29 MED ORDER — GABAPENTIN 300 MG PO CAPS: ORAL_CAPSULE | ORAL | Status: DC

## 2017-08-29 MED ORDER — WARFARIN SODIUM 5 MG PO TABS: 7.5000 mg | ORAL_TABLET | ORAL | Status: DC

## 2017-08-29 MED ORDER — MIDAZOLAM HCL 2 MG/2ML IJ SOLN
INTRAMUSCULAR | Status: DC | PRN
  Administered 2017-08-29: 2 mg via INTRAVENOUS

## 2017-08-29 MED ORDER — MIDAZOLAM HCL 2 MG/2ML IJ SOLN: 0.5000 mg | Freq: Once | INTRAMUSCULAR | Status: DC | PRN

## 2017-08-29 MED ORDER — HEPARIN SODIUM (PORCINE) 5000 UNIT/ML IJ SOLN
INTRAMUSCULAR | Status: DC | PRN
  Administered 2017-08-29: 14:00:00

## 2017-08-29 MED ORDER — ARTIFICIAL TEARS OPHTHALMIC OINT
TOPICAL_OINTMENT | OPHTHALMIC | Status: AC
  Filled 2017-08-29: qty 3.5

## 2017-08-29 MED ORDER — LIDOCAINE-EPINEPHRINE 0.5 %-1:200000 IJ SOLN
INTRAMUSCULAR | Status: DC | PRN
  Administered 2017-08-29: 50 mL

## 2017-08-29 MED ORDER — PROPOFOL 500 MG/50ML IV EMUL
INTRAVENOUS | Status: DC | PRN
  Administered 2017-08-29: 100 ug/kg/min via INTRAVENOUS

## 2017-08-29 MED ORDER — MIDAZOLAM HCL 2 MG/2ML IJ SOLN
INTRAMUSCULAR | Status: AC
  Filled 2017-08-29: qty 2

## 2017-08-29 MED ORDER — ONDANSETRON HCL 4 MG/2ML IJ SOLN
INTRAMUSCULAR | Status: AC
  Filled 2017-08-29: qty 4

## 2017-08-29 MED ORDER — PHENYLEPHRINE HCL 10 MG/ML IJ SOLN
INTRAVENOUS | Status: DC | PRN
  Administered 2017-08-29: 50 ug/min via INTRAVENOUS

## 2017-08-29 MED ORDER — PROMETHAZINE HCL 25 MG/ML IJ SOLN: 6.2500 mg | INTRAMUSCULAR | Status: DC | PRN

## 2017-08-29 MED ORDER — FENTANYL CITRATE (PF) 100 MCG/2ML IJ SOLN
INTRAMUSCULAR | Status: DC | PRN
  Administered 2017-08-29 (×2): 50 ug via INTRAVENOUS

## 2017-08-29 MED ORDER — ARTIFICIAL TEARS OPHTHALMIC OINT
TOPICAL_OINTMENT | OPHTHALMIC | Status: DC | PRN
  Administered 2017-08-29: 1 via OPHTHALMIC

## 2017-08-29 MED ORDER — FENTANYL CITRATE (PF) 100 MCG/2ML IJ SOLN
25.0000 ug | INTRAMUSCULAR | Status: DC | PRN
  Administered 2017-08-29 (×3): 50 ug via INTRAVENOUS

## 2017-08-29 MED ORDER — KETAMINE HCL 10 MG/ML IJ SOLN
INTRAMUSCULAR | Status: DC | PRN
  Administered 2017-08-29 (×2): 20 mg via INTRAVENOUS

## 2017-08-29 MED ORDER — FENTANYL CITRATE (PF) 100 MCG/2ML IJ SOLN
INTRAMUSCULAR | Status: AC
  Administered 2017-08-29: 50 ug via INTRAVENOUS
  Filled 2017-08-29: qty 2

## 2017-08-29 MED ORDER — FLUTICASONE PROPIONATE 50 MCG/ACT NA SUSP: NASAL | Status: DC

## 2017-08-29 MED ORDER — PHENYLEPHRINE HCL 10 MG/ML IJ SOLN: INTRAMUSCULAR | Status: DC | PRN

## 2017-08-29 MED ORDER — KETAMINE HCL-SODIUM CHLORIDE 100-0.9 MG/10ML-% IV SOSY
PREFILLED_SYRINGE | INTRAVENOUS | Status: AC
  Filled 2017-08-29: qty 10

## 2017-08-29 MED ORDER — MEPERIDINE HCL 25 MG/ML IJ SOLN: 6.2500 mg | INTRAMUSCULAR | Status: DC | PRN

## 2017-08-29 MED ORDER — CHLORHEXIDINE GLUCONATE 4 % EX LIQD: 60.0000 mL | Freq: Once | CUTANEOUS | Status: DC

## 2017-08-29 MED ORDER — ONDANSETRON HCL 4 MG/2ML IJ SOLN
INTRAMUSCULAR | Status: DC | PRN
  Administered 2017-08-29: 4 mg via INTRAVENOUS

## 2017-08-29 MED ORDER — LIDOCAINE-EPINEPHRINE 0.5 %-1:200000 IJ SOLN
INTRAMUSCULAR | Status: AC
  Filled 2017-08-29: qty 1

## 2017-08-29 MED ORDER — SODIUM CHLORIDE 0.9 % IR SOLN
Status: DC | PRN
  Administered 2017-08-29: 1000 mL

## 2017-08-29 MED ORDER — SODIUM CHLORIDE 0.9 % IV SOLN
INTRAVENOUS | Status: DC
  Administered 2017-08-29 (×2): via INTRAVENOUS

## 2017-08-29 MED ORDER — PROPOFOL 10 MG/ML IV BOLUS
INTRAVENOUS | Status: AC
  Filled 2017-08-29: qty 20

## 2017-08-29 MED ORDER — PROPOFOL 10 MG/ML IV BOLUS
INTRAVENOUS | Status: DC | PRN
  Administered 2017-08-29: 100 mg via INTRAVENOUS
  Administered 2017-08-29: 50 mg via INTRAVENOUS

## 2017-08-29 MED ORDER — DEXTROSE 5 % IV SOLN
1.5000 g | INTRAVENOUS | Status: AC
  Administered 2017-08-29: 1.5 g via INTRAVENOUS
  Filled 2017-08-29: qty 1.5

## 2017-08-29 MED ORDER — PHENYLEPHRINE 40 MCG/ML (10ML) SYRINGE FOR IV PUSH (FOR BLOOD PRESSURE SUPPORT)
PREFILLED_SYRINGE | INTRAVENOUS | Status: DC | PRN
  Administered 2017-08-29 (×2): 80 ug via INTRAVENOUS
  Administered 2017-08-29 (×2): 120 ug via INTRAVENOUS
  Administered 2017-08-29: 200 ug via INTRAVENOUS

## 2017-08-29 MED ORDER — SUCCINYLCHOLINE CHLORIDE 200 MG/10ML IV SOSY
PREFILLED_SYRINGE | INTRAVENOUS | Status: AC
  Filled 2017-08-29: qty 10

## 2017-08-29 MED ORDER — ROCURONIUM BROMIDE 10 MG/ML (PF) SYRINGE
PREFILLED_SYRINGE | INTRAVENOUS | Status: AC
  Filled 2017-08-29: qty 5

## 2017-08-29 SURGICAL SUPPLY — 35 items
ADH SKN CLS APL DERMABOND .7 (GAUZE/BANDAGES/DRESSINGS) ×1
ARMBAND PINK RESTRICT EXTREMIT (MISCELLANEOUS) ×4 IMPLANT
CANISTER SUCT 3000ML PPV (MISCELLANEOUS) ×3 IMPLANT
CANNULA VESSEL 3MM 2 BLNT TIP (CANNULA) ×3 IMPLANT
CATH EMB 4FR 80CM (CATHETERS) ×5 IMPLANT
CLIP LIGATING EXTRA MED SLVR (CLIP) ×3 IMPLANT
CLIP LIGATING EXTRA SM BLUE (MISCELLANEOUS) ×3 IMPLANT
COVER PROBE W GEL 5X96 (DRAPES) ×2 IMPLANT
DECANTER SPIKE VIAL GLASS SM (MISCELLANEOUS) ×3 IMPLANT
DERMABOND ADVANCED (GAUZE/BANDAGES/DRESSINGS) ×2
DERMABOND ADVANCED .7 DNX12 (GAUZE/BANDAGES/DRESSINGS) ×1 IMPLANT
ELECT REM PT RETURN 9FT ADLT (ELECTROSURGICAL) ×3
ELECTRODE REM PT RTRN 9FT ADLT (ELECTROSURGICAL) ×1 IMPLANT
GLOVE BIO SURGEON STRL SZ 6.5 (GLOVE) ×3 IMPLANT
GLOVE BIO SURGEONS STRL SZ 6.5 (GLOVE) ×3
GLOVE BIOGEL PI IND STRL 6.5 (GLOVE) IMPLANT
GLOVE BIOGEL PI INDICATOR 6.5 (GLOVE) ×8
GLOVE SS BIOGEL STRL SZ 7.5 (GLOVE) ×1 IMPLANT
GLOVE SUPERSENSE BIOGEL SZ 7.5 (GLOVE) ×2
GLOVE SURG SS PI 6.5 STRL IVOR (GLOVE) ×2 IMPLANT
GOWN STRL REUS W/ TWL LRG LVL3 (GOWN DISPOSABLE) ×3 IMPLANT
GOWN STRL REUS W/TWL LRG LVL3 (GOWN DISPOSABLE) ×12
KIT BASIN OR (CUSTOM PROCEDURE TRAY) ×3 IMPLANT
KIT ROOM TURNOVER OR (KITS) ×3 IMPLANT
NS IRRIG 1000ML POUR BTL (IV SOLUTION) ×3 IMPLANT
PACK CV ACCESS (CUSTOM PROCEDURE TRAY) ×3 IMPLANT
PAD ARMBOARD 7.5X6 YLW CONV (MISCELLANEOUS) ×6 IMPLANT
SUT PROLENE 6 0 CC (SUTURE) ×3 IMPLANT
SUT SILK 2 0 SH (SUTURE) ×2 IMPLANT
SUT SILK 3 0 (SUTURE) ×3
SUT SILK 3-0 18XBRD TIE 12 (SUTURE) IMPLANT
SUT VIC AB 3-0 SH 27 (SUTURE) ×9
SUT VIC AB 3-0 SH 27X BRD (SUTURE) ×1 IMPLANT
UNDERPAD 30X30 (UNDERPADS AND DIAPERS) ×3 IMPLANT
WATER STERILE IRR 1000ML POUR (IV SOLUTION) ×3 IMPLANT

## 2017-08-29 NOTE — Transfer of Care (Signed)
Immediate Anesthesia Transfer of Care Note  Patient: Brian [name]  Procedure(s) Performed: Procedure(s): ATTEMPTED THROMBECTOMY LEFT ARM ARTERIOVENOUS FISTULA (Left) BASCILIC VEIN FOREARM TRANSPOSITION (Left)  Patient Location: PACU  Anesthesia Type:General  Level of Consciousness: awake, alert , oriented and patient cooperative  Airway & Oxygen Therapy: Patient Spontanous Breathing  Post-op Assessment: Report given to RN, Post -op Vital signs reviewed and stable and Patient moving all extremities X 4  Post vital signs: Reviewed and stable  Last Vitals:  Vitals:   08/29/17 1300 08/29/17 1310  BP: (!) 140/99 (!) 144/49  Pulse: (!) 120 (!) 115  Resp: 18 18  Temp:  36.8 C  SpO2:  99%    Last Pain:  Vitals:   08/29/17 1310  TempSrc: Oral  PainSc: 0-No pain      Patients Stated Pain Goal: 4 (82/95/62 1308)  Complications: No apparent anesthesia complications

## 2017-08-29 NOTE — Progress Notes (Addendum)
Called for clarification where to put IV and draw labs, per [name], put it in right arm stated Clarene Critchley in O.R.  Report given to dialysis nurse, Dani.   [name] stated not to take the IV out of pt's hand and if dialysis' nurses had any questions for them to contact  Pt. Transferred to [place]- dialysis unit via stretcher and on heart monitor.

## 2017-08-29 NOTE — Anesthesia Procedure Notes (Addendum)
Procedure Name: MAC Date/Time: 08/29/2017 2:19 PM Performed by: Julieta Bellini Pre-anesthesia Checklist: Patient identified, Emergency Drugs available, Suction available and Patient being monitored Patient Re-evaluated:Patient Re-evaluated prior to induction Oxygen Delivery Method: Simple face mask

## 2017-08-29 NOTE — Discharge Instructions (Signed)
° °  [facility]  Discharge Instructions  AV Fistula or Graft Surgery for Dialysis Access  Please refer to the following instructions for your post-procedure care. Your surgeon or physician assistant will discuss any changes with you.  Activity  You may drive the day following your surgery, if you are comfortable and no longer taking prescription pain medication. Resume full activity as the soreness in your incision resolves.  Bathing/Showering  You may shower after you go home. Keep your incision dry for 48 hours. Do not soak in a bathtub, hot tub, or swim until the incision heals completely. You may not shower if you have a hemodialysis catheter.  Incision Care  Clean your incision with mild soap and water after 48 hours. Pat the area dry with a clean towel. You do not need a bandage unless otherwise instructed. Do not apply any ointments or creams to your incision. You may have skin glue on your incision. Do not peel it off. It will come off on its own in about one week. Your arm may swell a bit after surgery. To reduce swelling use pillows to elevate your arm so it is above your heart. Your doctor will tell you if you need to lightly wrap your arm with an ACE bandage.  Diet  Resume your normal diet. There are not special food restrictions following this procedure. In order to heal from your surgery, it is CRITICAL to get adequate nutrition. Your body requires vitamins, minerals, and protein. Vegetables are the best source of vitamins and minerals. Vegetables also provide the perfect balance of protein. Processed food has little nutritional value, so try to avoid this.  Medications  Resume taking all of your medications. If your incision is causing pain, you may take over-the counter pain relievers such as acetaminophen (Tylenol). If you were prescribed a stronger pain medication, please be aware these medications can cause nausea and constipation. Prevent  nausea by taking the medication with a snack or meal. Avoid constipation by drinking plenty of fluids and eating foods with high amount of fiber, such as fruits, vegetables, and grains. Do not take Tylenol if you are taking prescription pain medications.  Follow up Your surgeon may want to see you in the office following your access surgery. If so, this will be arranged at the time of your surgery.  Please call us immediately for any of the following conditions:  Increased pain, redness, drainage (pus) from your incision site Fever of 101 degrees or higher Severe or worsening pain at your incision site Hand pain or numbness.  Reduce your risk of vascular disease:  Stop smoking. If you would like help, call [facility] at 1-800-QUIT-NOW 947 658 5685) or [place] at [place] your cholesterol Maintain a desired weight Control your diabetes Keep your blood pressure down  Dialysis  It will take several weeks to several months for your new dialysis access to be ready for use. Your surgeon will determine when it is OK to use it. Your nephrologist will continue to direct your dialysis. You can continue to use your Permcath until your new access is ready for use.   08/29/2017 Brian [name] [id number] 1976/03/01  Surgeon(s): Early, Arvilla Meres, MD  Procedure(s): ATTEMPTED THROMBECTOMY LEFT ARM ARTERIOVENOUS FISTULA BASCILIC VEIN FOREARM TRANSPOSITION  x Do not stick fistula for 12 weeks.   If you have any questions, please call the office at [phone].

## 2017-08-29 NOTE — H&P (View-Only) (Signed)
Patient name: Brian [name] MRN: [id number] DOB: Apr 24, 1976 Sex: male  REASON FOR VISIT: Follow-up left AV fistula creation 06/25/2017  HPI: Brian [name] is a 41 y.o. male here for follow-up. He continues to use a right upper arm AV fistula. This has extensive aneurysmal change and causing him pain with hemodialysis. He had a new left radiocephalic fistula placed by myself on 06/25/2017 to hopefully allow maturation and then discontinuation of use of the right arm. He did have a nice cephalic vein at the time of surgery. Right this he is suffered thrombosis at the radiocephalic anastomosis. He noted he lost the thrill in this approximately 2 weeks ago.  Current Outpatient Prescriptions  Medication Sig Dispense Refill  . calcium acetate (PHOSLO) 667 MG capsule Take by mouth 3 (three) times daily with meals. *takes 2 capsules with every meal and 1 capsule with snacks    . cetirizine (ZYRTEC) 5 MG tablet Take 1 tablet (5 mg total) by mouth daily. (Patient taking differently: Take 5 mg by mouth daily as needed for allergies. ) 90 tablet 3  . dolutegravir (TIVICAY) 50 MG tablet Take 1 tablet (50 mg total) by mouth daily. 30 tablet 5  . dronabinol (MARINOL) 5 MG capsule Take 1 capsule (5 mg total) by mouth 2 (two) times daily before lunch and supper. Do not fill before 07/11/2017 60 capsule 0  . emtricitabine-tenofovir AF (DESCOVY) 200-25 MG tablet Take 1 tablet by mouth daily. 30 tablet 5  . fluticasone (FLONASE) 50 MCG/ACT nasal spray instill TWO SPRAYS in each nostril DAILY (Patient taking differently: instill TWO SPRAYS in each nostril daily as needed for allergies) 16 g 6  . gabapentin (NEURONTIN) 300 MG capsule TAKE ONE CAPSULE THREE TIMES DAILY 90 capsule 9  . lactulose (CHRONULAC) 10 GM/15ML solution Take 15 g by mouth daily as needed for mild constipation.     [name] oxyCODONE-acetaminophen (PERCOCET) 10-325 MG tablet Take 1 tablet by mouth every 4 (four) hours as  needed for pain. Do not fill until 07/11/2017 (Patient taking differently: Take 1 tablet by mouth every 8 (eight) hours as needed for pain. Do not fill until 07/11/2017) 90 tablet 0  . pramipexole (MIRAPEX) 0.25 MG tablet Take 2 tablets (0.5 mg total) by mouth at bedtime. 180 tablet 3  . PROAIR HFA 108 (90 BASE) MCG/ACT inhaler INHALE 2 PUFFS EVERY 4  HRS AS NEEDED FOR WHEEZING SHORTNESS OF BREATH 8 g 3  . SENSIPAR 30 MG tablet Take 30 mg by mouth daily with supper.     . warfarin (COUMADIN) 5 MG tablet Take 1.5 tablets (7.5 mg) daily 45 tablet 3  . multivitamin (RENA-VIT) TABS tablet Take 1 tablet by mouth at bedtime.     . Naloxone HCl (NARCAN) 4 MG/0.1ML LIQD Place 1 spray into the nose once. Use for overdose and dial 9-1-1. Repeat dose in 2-3 minutes if needed. MEDICAID: [id number] Q Richardson Landry) (Patient not taking: Reported on 08/05/2017) 1 each 0  . traZODone (DESYREL) 50 MG tablet      No current facility-administered medications for this visit.      PHYSICAL EXAM: Vitals:   08/05/17 1013  BP: (!) 159/110  Pulse: 93  Temp: 97.6 F (36.4 C)  TempSrc: Oral  SpO2: 97%  Weight: 201 lb (91.2 kg)  Height: 6\' 2"  (1.88 m)    GENERAL: The patient is a well-nourished male, in no acute distress. The vital signs are documented above. Continued patency of his cephalic vein above the anastomosis at  the wrist. Obvious thrombosis of the vein at the wrist.  Duplex today I confirm this. Does have a 3 mm vein above this.  MEDICAL ISSUES: Exam the cephalic vein is well-developed throughout the forearm. I reimage this myself with SonoSite. The vein is widely patent and does drain into the basilic vein at the antecubital space. Patient wishes to pursue trying to maintain patency of this fistula feel this is appropriate since he does have a good size cephalic vein. He does have difficulty with clotting and is on chronic Coumadin. We will hold this for several days and revise and thrombectomize his left  radiocephalic fistula. 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

## 2017-08-29 NOTE — Procedures (Signed)
   I was present at this dialysis session, have reviewed the session itself and made  appropriate changes Kelly Splinter MD [place] pager [phone]   08/29/2017, 1:15 PM

## 2017-08-29 NOTE — Anesthesia Preprocedure Evaluation (Addendum)
Anesthesia Evaluation  Patient identified by MRN, date of birth, ID band Patient awake    Reviewed: Allergy & Precautions, H&P , NPO status , Patient's Chart, lab work & pertinent test results  History of Anesthesia Complications (+) AWARENESS UNDER ANESTHESIA and history of anesthetic complications  Airway Mallampati: II  TM Distance: >3 FB Neck ROM: Full    Dental no notable dental hx. (+) Edentulous Upper, Edentulous Lower, Dental Advisory Given   Pulmonary shortness of breath and with exertion, pneumonia, resolved, COPD,  COPD inhaler, Current Smoker,    Pulmonary exam normal breath sounds clear to auscultation       Cardiovascular hypertension, Pt. on medications + Peripheral Vascular Disease  + Valvular Problems/Murmurs  Rhythm:Regular Rate:Normal  Hx/o Bilateral DVT's LE Hx/o PTE   Neuro/Psych PSYCHIATRIC DISORDERS Anxiety Depression Peripheral Neuropathy Restless legs syndrome  Neuromuscular disease    GI/Hepatic hiatal hernia, GERD  Medicated and Controlled,(+)     substance abuse  alcohol use, Cysts in liver   Endo/Other  negative endocrine ROS  Renal/GU ESRF and DialysisRenal diseasePolycystic kidney disease S/P left nephrectomy Dialysis M W F  negative genitourinary   Musculoskeletal Chronic back pain   Abdominal   Peds  Hematology  (+) anemia , HIV, Coumadin- Last dose 08/25/2017   Anesthesia Other Findings   Reproductive/Obstetrics negative OB ROS                            Anesthesia Physical  Anesthesia Plan  ASA: IV  Anesthesia Plan: MAC   Post-op Pain Management:    Induction: Intravenous  PONV Risk Score and Plan: 1 and Ondansetron, Propofol and Midazolam  Airway Management Planned: Simple Face Mask  Additional Equipment:   Intra-op Plan:   Post-operative Plan:   Informed Consent: I have reviewed the patients History and Physical, chart, labs and  discussed the procedure including the risks, benefits and alternatives for the proposed anesthesia with the patient or authorized representative who has indicated his/her understanding and acceptance.   Dental advisory given  Plan Discussed with: CRNA, Anesthesiologist and Surgeon  Anesthesia Plan Comments: (K- 6.1 this am using iStat. Will send for dialysis and then do later this pm.)       Anesthesia Quick Evaluation

## 2017-08-29 NOTE — Anesthesia Procedure Notes (Signed)
Procedure Name: LMA Insertion Date/Time: 08/29/2017 2:49 PM Performed by: Nolon Nations Pre-anesthesia Checklist: Patient identified, Emergency Drugs available, Suction available and Patient being monitored Patient Re-evaluated:Patient Re-evaluated prior to induction Oxygen Delivery Method: Circle system utilized Preoxygenation: Pre-oxygenation with 100% oxygen Induction Type: IV induction Tube type: Oral Number of attempts: 1 Placement Confirmation: CO2 detector and breath sounds checked- equal and bilateral Tube secured with: Tape

## 2017-08-29 NOTE — Interval H&P Note (Signed)
History and Physical Interval Note: He is here today for thrombectomy and revision of his left arm AV fistula. Has not had hemodialysis for 2 days. Today is his usual day. Potassium today is 6.1. Also drank approximately 8 ounces of coffee this morning. Did have dialysis via his right upper arm fistula on Wednesday but reports that infiltration at that time. His fistula is patent on the right arm with adequate area for access. Will discuss with nephrology but requested a hemodialysis this morning due to his elevated potassium and plan revision of his access later this afternoon  08/29/2017 8:55 AM  Brian [name]  has presented today for surgery, with the diagnosis of End Stage Renal Disease N18.6  Thrombosis of left arm arteriovenous fistula  T82.868  The various methods of treatment have been discussed with the patient and family. After consideration of risks, benefits and other options for treatment, the patient has consented to  Procedure(s): THROMBECTOMY LEFT ARM ARTERIOVENOUS FISTULA; REVISION OF LEFT ARM ARTERIOVENOUS FISTULA (Left) as a surgical intervention .  The patient's history has been reviewed, patient examined, no change in status, stable for surgery.  I have reviewed the patient's chart and labs.  Questions were answered to the patient's satisfaction.     Curt Jews

## 2017-08-29 NOTE — Op Note (Signed)
    OPERATIVE REPORT  DATE OF SURGERY: 08/29/2017  PATIENT: Brian [name], 41 y.o. male MRN: [id number]  DOB: [birth year]  PRE-OPERATIVE DIAGNOSIS: End-stage renal disease with poorly functioning right upper arm AV fistula  POST-OPERATIVE DIAGNOSIS:  Same  PROCEDURE: Attempted thrombectomy of left radiocephalic AV fistula, creation of new forearm left basilic vein transposition fistula  SURGEON:  Curt Jews, M.D.  PHYSICIAN ASSISTANT: Samantha Rhyne PA-C  ANESTHESIA:  Gen.  EBL: 25 ml  Total I/O In: 500 [I.V.:500] Out: 7482 [Other:3480; Blood:25]  BLOOD ADMINISTERED: None  DRAINS: None  SPECIMEN: None  COUNTS CORRECT:  YES  PLAN OF CARE: PACU   PATIENT DISPOSITION:  PACU - hemodynamically stable  PROCEDURE DETAILS: The patient was taken to the placed supine position where the area of the left arm from [facility] sterile fashion. Incision was made over the prior radiocephalic anastomosis. The cephalic vein was very thickened. The vein was opened transversely and was completely sclerotic. There was no lumen at this level. The vein was opened longitudinally more proximally and the remaining very sclerotic. There was a lumen but a 2 dilator would not pass through this. Felt there was no way to salvage this fistula. The patient had an extremely large basilic vein running throughout the medial aspect of his forearm. Incision was made to proceed with a basilic vein forearm transposition fistula. Several incisions were made over the medial aspect of the forearm over the basilic vein. One was at the elbow and one at the wrist and then 1 in the middle. The basilic vein was mobilized circumferentially throughout its course down to the wrist. Tributary branches were ligated with 3 or 4 silk ties and divided. The vein was ligated distally at the wrist. The vein was marked to prevent twisting. The vein was gently dilated was of a very large caliber. The radial artery was exposed proximal and  distal to the old anastomosis. A tunnel was created from the level of the radial artery at the wrist to the level of the basilic vein at the antecubital space. The vein was brought through this tunnel. The vein again was gently dilated with heparinized saline to assure that there was the correct length. The radial artery was occluded proximal and distal to the old anastomosis and the old anastomosis was taken down. 2 dilator passed easily through the radial artery. The vein was cut to appropriate length and was spatulated and sewn end-to-side to the artery with a running 6-0 Prolene suture. Prior to completion of the closure the clamps removed in the usual flushing maneuvers were undertaken. Anastomosis completed and flow was restored. The patient had excellent thrill in the fistula. The wounds were irrigated with saline. Hemostasis tablet cautery. Wounds were closed with 3-0 Vicryl in the subcutaneous and subcuticular tissue. Sterile dressing was applied the patient was transferred to the recovery room stable condition   Rosetta Posner, M.D., [facility] 08/29/2017 5:05 PM

## 2017-08-30 NOTE — Anesthesia Postprocedure Evaluation (Signed)
Anesthesia Post Note  Patient: MARSDEN ZAINO  Procedure(s) Performed: Procedure(s) (LRB): ATTEMPTED THROMBECTOMY LEFT ARM ARTERIOVENOUS FISTULA (Left) BASCILIC VEIN FOREARM TRANSPOSITION (Left)     Patient location during evaluation: PACU Anesthesia Type: MAC Level of consciousness: awake and alert Pain management: pain level controlled Vital Signs Assessment: post-procedure vital signs reviewed and stable Respiratory status: spontaneous breathing, nonlabored ventilation, respiratory function stable and patient connected to nasal cannula oxygen Cardiovascular status: blood pressure returned to baseline and stable Postop Assessment: no apparent nausea or vomiting Anesthetic complications: no    Last Vitals:  Vitals:   08/29/17 1754 08/29/17 1755  BP: 107/62   Pulse: (!) 113   Resp: (!) 23 12  Temp:  36.5 C  SpO2: 99% 100%    Last Pain:  Vitals:   08/29/17 1745  TempSrc:   PainSc: 7                  Montez Hageman

## 2017-08-31 ENCOUNTER — Encounter (HOSPITAL_COMMUNITY): Payer: Self-pay | Admitting: Vascular Surgery

## 2017-09-01 ENCOUNTER — Telehealth: Payer: Self-pay | Admitting: Vascular Surgery

## 2017-09-01 DIAGNOSIS — N186 End stage renal disease: Secondary | ICD-10-CM | POA: Diagnosis not present

## 2017-09-01 DIAGNOSIS — D509 Iron deficiency anemia, unspecified: Secondary | ICD-10-CM | POA: Diagnosis not present

## 2017-09-01 DIAGNOSIS — E875 Hyperkalemia: Secondary | ICD-10-CM | POA: Diagnosis not present

## 2017-09-01 DIAGNOSIS — N2581 Secondary hyperparathyroidism of renal origin: Secondary | ICD-10-CM | POA: Diagnosis not present

## 2017-09-01 NOTE — Telephone Encounter (Signed)
Sched appt 10/14/17; lab at 3:00 and MD at 3:45. Lm on hm#.

## 2017-09-01 NOTE — Telephone Encounter (Signed)
-----   Message from Mena Goes, RN sent at 08/29/2017  5:01 PM EDT ----- Regarding: 4-6 weeks w/ duplex   ----- Message ----- From: Gabriel Earing, PA-C Sent: 08/29/2017   4:53 PM To: Vvs Charge Pool  S/p left forearm BVT.  F/u with [name] in 4-6 weeks with duplex.  Thanks

## 2017-09-02 ENCOUNTER — Encounter: Payer: Self-pay | Admitting: Vascular Surgery

## 2017-09-02 ENCOUNTER — Ambulatory Visit: Payer: Self-pay

## 2017-09-02 ENCOUNTER — Ambulatory Visit (INDEPENDENT_AMBULATORY_CARE_PROVIDER_SITE_OTHER): Payer: Self-pay | Admitting: Vascular Surgery

## 2017-09-02 VITALS — BP 142/97 | HR 90 | Temp 97.7°F | Resp 20 | Ht 75.0 in | Wt 206.0 lb

## 2017-09-02 DIAGNOSIS — N186 End stage renal disease: Secondary | ICD-10-CM

## 2017-09-02 DIAGNOSIS — Z992 Dependence on renal dialysis: Secondary | ICD-10-CM

## 2017-09-02 NOTE — Progress Notes (Addendum)
POST OPERATIVE OFFICE NOTE    CC:  F/u for surgery  HPI:  This is a 41 y.o. male who is s/p attempted thrombectomy of left radial cephalic AV fistula and creation of new forearm left basilic vein transposition fistula on 08/29/17 by Dr. Donnetta [name].  He called our nurse today after being at dialysis.  He was told at dialysis that he had bruising like they had never seen and was told to call our office to be evaluated.    He states that he did have some pain for a few hours after surgery, but this has significantly improved.    His nephrologist is Dr. Jimmy [name].  Allergies  Allergen Reactions  . Cat Hair Extract Other (See Comments)    SNEEZING    Current Outpatient Prescriptions  Medication Sig Dispense Refill  . calcium acetate (PHOSLO) 667 MG capsule Take 667-1,334 mg by mouth See admin instructions. Take 1334 mg by mouth 3 times daily with meals. Take 667 mg by mouth with snacks    . cetirizine (ZYRTEC) 5 MG tablet Take 1 tablet (5 mg total) by mouth daily. (Patient taking differently: Take 5 mg by mouth daily as needed for allergies. ) 90 tablet 3  . dolutegravir (TIVICAY) 50 MG tablet Take 1 tablet (50 mg total) by mouth daily. 30 tablet 5  . dronabinol (MARINOL) 5 MG capsule Take 1 capsule (5 mg total) by mouth 2 (two) times daily before lunch and supper. Do not fill before 30 days from prior 60 capsule 0  . emtricitabine-tenofovir AF (DESCOVY) 200-25 MG tablet Take 1 tablet by mouth daily. 30 tablet 5  . fluticasone (FLONASE) 50 MCG/ACT nasal spray instill TWO SPRAYS in each nostril daily as needed for allergies    . gabapentin (NEURONTIN) 300 MG capsule Take 300 mg by mouth twice daily    . lactulose (CHRONULAC) 10 GM/15ML solution Take 15 mLs (10 g total) by mouth daily as needed for mild constipation. 236 mL 0  . OVER THE COUNTER MEDICATION Take 1 capsule by mouth daily as needed (for sleep). Kratom    . oxyCODONE-acetaminophen (PERCOCET) 10-325 MG tablet Take 1 tablet by mouth  every 8 (eight) hours as needed for pain. Do not fill before 30 days from prior 90 tablet 0  . pramipexole (MIRAPEX) 0.25 MG tablet Take 2 tablets (0.5 mg total) by mouth at bedtime. 180 tablet 3  . PROAIR HFA 108 (90 BASE) MCG/ACT inhaler INHALE 2 PUFFS EVERY 4  HRS AS NEEDED FOR WHEEZING SHORTNESS OF BREATH 8 g 3  . SENSIPAR 30 MG tablet Take 30 mg by mouth daily with supper.     . warfarin (COUMADIN) 5 MG tablet Take 1.5-2 tablets (7.5-10 mg total) by mouth See admin instructions. Take 10 mg by mouth daily on Sunday, Tuesday and Thursday. Take 7.5 mg by mouth daily on all other days     No current facility-administered medications for this visit.      ROS:  See HPI  Physical Exam:  Vitals:   09/02/17 1541  BP: (!) 142/97  Pulse: 90  Resp: 20  Temp: 97.7 F (36.5 C)  SpO2: 98%    Incision:  All incisions look good and are healing nicely.  Extremities:  Mild ecchymosis along vein harvest incisions and minimal bruising where the vein was tunneled.  There is a +strong thrill within the fistula.  It is easily palpated.  The radial pulse is easily palpated.   Assessment/Plan:  This is a 41 y.o.  male who is s/p: attempted thrombectomy of left radial cephalic AV fistula and creation of new forearm left basilic vein transposition fistula on 08/29/17 by Dr. Donnetta [name].  -pt doing well with his new left forearm basilic vein transposition.  His incisions are healing well and there is minimal bruising.  The fistula is easily palpable with a strong thrill present.   -he will f/u on October 30th to evaluate maturation of the fistula.    Brian [name], [name] [phone]  Clinic MD:  Pt seen and examined with Dr. Donnetta [name]

## 2017-09-03 DIAGNOSIS — N2581 Secondary hyperparathyroidism of renal origin: Secondary | ICD-10-CM | POA: Diagnosis not present

## 2017-09-03 DIAGNOSIS — N186 End stage renal disease: Secondary | ICD-10-CM | POA: Diagnosis not present

## 2017-09-03 DIAGNOSIS — D509 Iron deficiency anemia, unspecified: Secondary | ICD-10-CM | POA: Diagnosis not present

## 2017-09-03 DIAGNOSIS — E875 Hyperkalemia: Secondary | ICD-10-CM | POA: Diagnosis not present

## 2017-09-05 DIAGNOSIS — N186 End stage renal disease: Secondary | ICD-10-CM | POA: Diagnosis not present

## 2017-09-05 DIAGNOSIS — E875 Hyperkalemia: Secondary | ICD-10-CM | POA: Diagnosis not present

## 2017-09-05 DIAGNOSIS — D509 Iron deficiency anemia, unspecified: Secondary | ICD-10-CM | POA: Diagnosis not present

## 2017-09-05 DIAGNOSIS — N2581 Secondary hyperparathyroidism of renal origin: Secondary | ICD-10-CM | POA: Diagnosis not present

## 2017-09-08 DIAGNOSIS — D509 Iron deficiency anemia, unspecified: Secondary | ICD-10-CM | POA: Diagnosis not present

## 2017-09-08 DIAGNOSIS — N2581 Secondary hyperparathyroidism of renal origin: Secondary | ICD-10-CM | POA: Diagnosis not present

## 2017-09-08 DIAGNOSIS — E875 Hyperkalemia: Secondary | ICD-10-CM | POA: Diagnosis not present

## 2017-09-08 DIAGNOSIS — N186 End stage renal disease: Secondary | ICD-10-CM | POA: Diagnosis not present

## 2017-09-09 ENCOUNTER — Ambulatory Visit: Payer: Self-pay | Admitting: Pharmacist

## 2017-09-10 DIAGNOSIS — N186 End stage renal disease: Secondary | ICD-10-CM | POA: Diagnosis not present

## 2017-09-10 DIAGNOSIS — D509 Iron deficiency anemia, unspecified: Secondary | ICD-10-CM | POA: Diagnosis not present

## 2017-09-10 DIAGNOSIS — E875 Hyperkalemia: Secondary | ICD-10-CM | POA: Diagnosis not present

## 2017-09-10 DIAGNOSIS — N2581 Secondary hyperparathyroidism of renal origin: Secondary | ICD-10-CM | POA: Diagnosis not present

## 2017-09-11 ENCOUNTER — Ambulatory Visit (INDEPENDENT_AMBULATORY_CARE_PROVIDER_SITE_OTHER): Payer: Medicare Other | Admitting: Pharmacist

## 2017-09-11 ENCOUNTER — Encounter: Payer: Self-pay | Admitting: Pharmacist

## 2017-09-11 DIAGNOSIS — Z7901 Long term (current) use of anticoagulants: Secondary | ICD-10-CM

## 2017-09-11 DIAGNOSIS — Z86718 Personal history of other venous thrombosis and embolism: Secondary | ICD-10-CM

## 2017-09-11 LAB — POCT INR: INR: 1.5

## 2017-09-11 NOTE — Patient Instructions (Signed)
Patient educated about medication as defined in this encounter and verbalized understanding by repeating back instructions provided.   
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
## 2017-09-11 NOTE — Progress Notes (Signed)
INTERNAL MEDICINE TEACHING ATTENDING ADDENDUM  I agree with pharmacy recommendations as outlined in their note.   Alexander N Raines, MD  

## 2017-09-12 DIAGNOSIS — N2581 Secondary hyperparathyroidism of renal origin: Secondary | ICD-10-CM | POA: Diagnosis not present

## 2017-09-12 DIAGNOSIS — N186 End stage renal disease: Secondary | ICD-10-CM | POA: Diagnosis not present

## 2017-09-12 DIAGNOSIS — D509 Iron deficiency anemia, unspecified: Secondary | ICD-10-CM | POA: Diagnosis not present

## 2017-09-12 DIAGNOSIS — E875 Hyperkalemia: Secondary | ICD-10-CM | POA: Diagnosis not present

## 2017-09-14 DIAGNOSIS — I120 Hypertensive chronic kidney disease with stage 5 chronic kidney disease or end stage renal disease: Secondary | ICD-10-CM | POA: Diagnosis not present

## 2017-09-14 DIAGNOSIS — N186 End stage renal disease: Secondary | ICD-10-CM | POA: Diagnosis not present

## 2017-09-14 DIAGNOSIS — Z992 Dependence on renal dialysis: Secondary | ICD-10-CM | POA: Diagnosis not present

## 2017-09-15 DIAGNOSIS — D631 Anemia in chronic kidney disease: Secondary | ICD-10-CM | POA: Diagnosis not present

## 2017-09-15 DIAGNOSIS — Z23 Encounter for immunization: Secondary | ICD-10-CM | POA: Diagnosis not present

## 2017-09-15 DIAGNOSIS — N2581 Secondary hyperparathyroidism of renal origin: Secondary | ICD-10-CM | POA: Diagnosis not present

## 2017-09-15 DIAGNOSIS — D509 Iron deficiency anemia, unspecified: Secondary | ICD-10-CM | POA: Diagnosis not present

## 2017-09-15 DIAGNOSIS — T82590D Other mechanical complication of surgically created arteriovenous fistula, subsequent encounter: Secondary | ICD-10-CM | POA: Diagnosis not present

## 2017-09-15 DIAGNOSIS — N186 End stage renal disease: Secondary | ICD-10-CM | POA: Diagnosis not present

## 2017-09-18 ENCOUNTER — Encounter: Payer: Self-pay | Admitting: Vascular Surgery

## 2017-09-18 ENCOUNTER — Other Ambulatory Visit: Payer: Self-pay

## 2017-09-18 DIAGNOSIS — Z48812 Encounter for surgical aftercare following surgery on the circulatory system: Secondary | ICD-10-CM

## 2017-09-18 DIAGNOSIS — N185 Chronic kidney disease, stage 5: Secondary | ICD-10-CM

## 2017-09-18 DIAGNOSIS — N186 End stage renal disease: Secondary | ICD-10-CM

## 2017-09-19 ENCOUNTER — Other Ambulatory Visit: Payer: Self-pay | Admitting: *Deleted

## 2017-09-19 ENCOUNTER — Encounter: Payer: Self-pay | Admitting: *Deleted

## 2017-09-19 ENCOUNTER — Ambulatory Visit (INDEPENDENT_AMBULATORY_CARE_PROVIDER_SITE_OTHER): Payer: Medicare Other | Admitting: Vascular Surgery

## 2017-09-19 ENCOUNTER — Encounter: Payer: Self-pay | Admitting: Vascular Surgery

## 2017-09-19 VITALS — BP 215/109 | HR 88 | Resp 16 | Ht 75.0 in | Wt 219.0 lb

## 2017-09-19 DIAGNOSIS — N2581 Secondary hyperparathyroidism of renal origin: Secondary | ICD-10-CM | POA: Diagnosis not present

## 2017-09-19 DIAGNOSIS — N186 End stage renal disease: Secondary | ICD-10-CM | POA: Diagnosis not present

## 2017-09-19 DIAGNOSIS — D509 Iron deficiency anemia, unspecified: Secondary | ICD-10-CM | POA: Diagnosis not present

## 2017-09-19 DIAGNOSIS — Z23 Encounter for immunization: Secondary | ICD-10-CM | POA: Diagnosis not present

## 2017-09-19 DIAGNOSIS — T82590D Other mechanical complication of surgically created arteriovenous fistula, subsequent encounter: Secondary | ICD-10-CM | POA: Diagnosis not present

## 2017-09-19 DIAGNOSIS — D631 Anemia in chronic kidney disease: Secondary | ICD-10-CM | POA: Diagnosis not present

## 2017-09-19 MED ORDER — SODIUM CHLORIDE 0.9% FLUSH: 3.0000 mL | INTRAVENOUS | Status: DC | PRN
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
## 2017-09-22 DIAGNOSIS — D631 Anemia in chronic kidney disease: Secondary | ICD-10-CM | POA: Diagnosis not present

## 2017-09-22 DIAGNOSIS — N186 End stage renal disease: Secondary | ICD-10-CM | POA: Diagnosis not present

## 2017-09-22 DIAGNOSIS — T82590D Other mechanical complication of surgically created arteriovenous fistula, subsequent encounter: Secondary | ICD-10-CM | POA: Diagnosis not present

## 2017-09-22 DIAGNOSIS — N2581 Secondary hyperparathyroidism of renal origin: Secondary | ICD-10-CM | POA: Diagnosis not present

## 2017-09-22 DIAGNOSIS — D509 Iron deficiency anemia, unspecified: Secondary | ICD-10-CM | POA: Diagnosis not present

## 2017-09-22 DIAGNOSIS — Z23 Encounter for immunization: Secondary | ICD-10-CM | POA: Diagnosis not present

## 2017-09-24 DIAGNOSIS — D631 Anemia in chronic kidney disease: Secondary | ICD-10-CM | POA: Diagnosis not present

## 2017-09-24 DIAGNOSIS — N2581 Secondary hyperparathyroidism of renal origin: Secondary | ICD-10-CM | POA: Diagnosis not present

## 2017-09-24 DIAGNOSIS — Z23 Encounter for immunization: Secondary | ICD-10-CM | POA: Diagnosis not present

## 2017-09-24 DIAGNOSIS — N186 End stage renal disease: Secondary | ICD-10-CM | POA: Diagnosis not present

## 2017-09-24 DIAGNOSIS — T82590D Other mechanical complication of surgically created arteriovenous fistula, subsequent encounter: Secondary | ICD-10-CM | POA: Diagnosis not present

## 2017-09-24 DIAGNOSIS — D509 Iron deficiency anemia, unspecified: Secondary | ICD-10-CM | POA: Diagnosis not present

## 2017-09-25 ENCOUNTER — Ambulatory Visit (HOSPITAL_COMMUNITY)
Admission: RE | Admit: 2017-09-25 | Discharge: 2017-09-25 | Disposition: A | Discharge status: Home / Self Care | Payer: Medicare Other | Source: Ambulatory Visit | Attending: Vascular Surgery | Admitting: Vascular Surgery

## 2017-09-25 ENCOUNTER — Encounter (HOSPITAL_COMMUNITY)
Admission: RE | Disposition: A | Discharge status: Home / Self Care | Payer: Self-pay | Source: Ambulatory Visit | Attending: Vascular Surgery

## 2017-09-25 DIAGNOSIS — Z86711 Personal history of pulmonary embolism: Secondary | ICD-10-CM | POA: Insufficient documentation

## 2017-09-25 DIAGNOSIS — N186 End stage renal disease: Secondary | ICD-10-CM | POA: Insufficient documentation

## 2017-09-25 DIAGNOSIS — F419 Anxiety disorder, unspecified: Secondary | ICD-10-CM | POA: Diagnosis not present

## 2017-09-25 DIAGNOSIS — K5900 Constipation, unspecified: Secondary | ICD-10-CM | POA: Insufficient documentation

## 2017-09-25 DIAGNOSIS — F1010 Alcohol abuse, uncomplicated: Secondary | ICD-10-CM | POA: Diagnosis not present

## 2017-09-25 DIAGNOSIS — Z21 Asymptomatic human immunodeficiency virus [HIV] infection status: Secondary | ICD-10-CM | POA: Diagnosis not present

## 2017-09-25 DIAGNOSIS — G2581 Restless legs syndrome: Secondary | ICD-10-CM | POA: Insufficient documentation

## 2017-09-25 DIAGNOSIS — Z905 Acquired absence of kidney: Secondary | ICD-10-CM | POA: Diagnosis not present

## 2017-09-25 DIAGNOSIS — F329 Major depressive disorder, single episode, unspecified: Secondary | ICD-10-CM | POA: Insufficient documentation

## 2017-09-25 DIAGNOSIS — D649 Anemia, unspecified: Secondary | ICD-10-CM | POA: Insufficient documentation

## 2017-09-25 DIAGNOSIS — F17210 Nicotine dependence, cigarettes, uncomplicated: Secondary | ICD-10-CM | POA: Diagnosis not present

## 2017-09-25 DIAGNOSIS — M549 Dorsalgia, unspecified: Secondary | ICD-10-CM | POA: Insufficient documentation

## 2017-09-25 DIAGNOSIS — E079 Disorder of thyroid, unspecified: Secondary | ICD-10-CM | POA: Insufficient documentation

## 2017-09-25 DIAGNOSIS — I120 Hypertensive chronic kidney disease with stage 5 chronic kidney disease or end stage renal disease: Secondary | ICD-10-CM | POA: Diagnosis not present

## 2017-09-25 DIAGNOSIS — D849 Immunodeficiency, unspecified: Secondary | ICD-10-CM | POA: Insufficient documentation

## 2017-09-25 DIAGNOSIS — Z86718 Personal history of other venous thrombosis and embolism: Secondary | ICD-10-CM | POA: Insufficient documentation

## 2017-09-25 DIAGNOSIS — J42 Unspecified chronic bronchitis: Secondary | ICD-10-CM | POA: Diagnosis not present

## 2017-09-25 DIAGNOSIS — J449 Chronic obstructive pulmonary disease, unspecified: Secondary | ICD-10-CM | POA: Diagnosis not present

## 2017-09-25 DIAGNOSIS — Q613 Polycystic kidney, unspecified: Secondary | ICD-10-CM | POA: Insufficient documentation

## 2017-09-25 DIAGNOSIS — T82858A Stenosis of vascular prosthetic devices, implants and grafts, initial encounter: Secondary | ICD-10-CM | POA: Insufficient documentation

## 2017-09-25 DIAGNOSIS — I712 Thoracic aortic aneurysm, without rupture: Secondary | ICD-10-CM | POA: Diagnosis not present

## 2017-09-25 DIAGNOSIS — Z992 Dependence on renal dialysis: Secondary | ICD-10-CM | POA: Diagnosis not present

## 2017-09-25 DIAGNOSIS — G629 Polyneuropathy, unspecified: Secondary | ICD-10-CM | POA: Insufficient documentation

## 2017-09-25 DIAGNOSIS — G8929 Other chronic pain: Secondary | ICD-10-CM | POA: Insufficient documentation

## 2017-09-25 DIAGNOSIS — Z79899 Other long term (current) drug therapy: Secondary | ICD-10-CM | POA: Insufficient documentation

## 2017-09-25 DIAGNOSIS — T82898A Other specified complication of vascular prosthetic devices, implants and grafts, initial encounter: Secondary | ICD-10-CM | POA: Diagnosis not present

## 2017-09-25 DIAGNOSIS — Z7901 Long term (current) use of anticoagulants: Secondary | ICD-10-CM | POA: Insufficient documentation

## 2017-09-25 HISTORY — PX: A/V FISTULAGRAM: CATH118298

## 2017-09-25 HISTORY — PX: PERIPHERAL VASCULAR BALLOON ANGIOPLASTY: CATH118281

## 2017-09-25 LAB — PROTIME-INR
INR: 1.58
Prothrombin Time: 18.8 seconds — ABNORMAL HIGH (ref 11.4–15.2)

## 2017-09-25 LAB — POCT I-STAT, CHEM 8
BUN: 49 mg/dL — AB (ref 6–20)
CALCIUM ION: 1 mmol/L — AB (ref 1.15–1.40)
CREATININE: 9.4 mg/dL — AB (ref 0.61–1.24)
Chloride: 98 mmol/L — ABNORMAL LOW (ref 101–111)
Glucose, Bld: 97 mg/dL (ref 65–99)
HEMATOCRIT: 36 % — AB (ref 39.0–52.0)
HEMOGLOBIN: 12.2 g/dL — AB (ref 13.0–17.0)
POTASSIUM: 6.1 mmol/L — AB (ref 3.5–5.1)
SODIUM: 133 mmol/L — AB (ref 135–145)
TCO2: 25 mmol/L (ref 22–32)

## 2017-09-25 SURGERY — A/V FISTULAGRAM
Anesthesia: LOCAL | Laterality: Right

## 2017-09-25 MED ORDER — LIDOCAINE HCL 2 % IJ SOLN
INTRAMUSCULAR | Status: AC
  Filled 2017-09-25: qty 10

## 2017-09-25 MED ORDER — FENTANYL CITRATE (PF) 100 MCG/2ML IJ SOLN
INTRAMUSCULAR | Status: DC | PRN
  Administered 2017-09-25: 50 ug via INTRAVENOUS

## 2017-09-25 MED ORDER — SODIUM CHLORIDE 0.9% FLUSH: 3.0000 mL | Freq: Two times a day (BID) | INTRAVENOUS | Status: DC

## 2017-09-25 MED ORDER — HEPARIN (PORCINE) IN NACL 2-0.9 UNIT/ML-% IJ SOLN
INTRAMUSCULAR | Status: AC
  Filled 2017-09-25: qty 500

## 2017-09-25 MED ORDER — HEPARIN (PORCINE) IN NACL 2-0.9 UNIT/ML-% IJ SOLN
INTRAMUSCULAR | Status: AC | PRN
  Administered 2017-09-25: 500 mL via INTRA_ARTERIAL

## 2017-09-25 MED ORDER — SODIUM CHLORIDE 0.9 % IV SOLN: 250.0000 mL | INTRAVENOUS | Status: DC | PRN

## 2017-09-25 MED ORDER — LABETALOL HCL 5 MG/ML IV SOLN: 10.0000 mg | INTRAVENOUS | Status: DC | PRN

## 2017-09-25 MED ORDER — LIDOCAINE HCL 2 % IJ SOLN
INTRAMUSCULAR | Status: DC | PRN
  Administered 2017-09-25: 2 mL via INTRADERMAL

## 2017-09-25 MED ORDER — SODIUM POLYSTYRENE SULFONATE 15 GM/60ML PO SUSP: 15.0000 g | Freq: Once | ORAL | Status: DC

## 2017-09-25 MED ORDER — OXYCODONE HCL 5 MG PO TABS: 5.0000 mg | ORAL_TABLET | Freq: Four times a day (QID) | ORAL | Status: DC | PRN

## 2017-09-25 MED ORDER — HYDRALAZINE HCL 20 MG/ML IJ SOLN: 5.0000 mg | INTRAMUSCULAR | Status: DC | PRN

## 2017-09-25 MED ORDER — IODIXANOL 320 MG/ML IV SOLN
INTRAVENOUS | Status: DC | PRN
  Administered 2017-09-25: 35 mL via INTRAVENOUS

## 2017-09-25 MED ORDER — FENTANYL CITRATE (PF) 100 MCG/2ML IJ SOLN
INTRAMUSCULAR | Status: AC
  Filled 2017-09-25: qty 2

## 2017-09-25 MED ORDER — SODIUM CHLORIDE 0.9% FLUSH: 3.0000 mL | INTRAVENOUS | Status: DC | PRN

## 2017-09-25 SURGICAL SUPPLY — 18 items
BAG SNAP BAND KOVER 36X36 (MISCELLANEOUS) ×2 IMPLANT
BALLN MUSTANG 6X80X75 (BALLOONS) ×2
BALLN MUSTANG 8X80X75 (BALLOONS) ×2
BALLOON MUSTANG 6X80X75 (BALLOONS) IMPLANT
BALLOON MUSTANG 8X80X75 (BALLOONS) IMPLANT
CATH ANGIO 5F BER2 65CM (CATHETERS) ×1 IMPLANT
COVER DOME SNAP 22 D (MISCELLANEOUS) ×2 IMPLANT
COVER PRB 48X5XTLSCP FOLD TPE (BAG) ×1 IMPLANT
COVER PROBE 5X48 (BAG) ×2
KIT ENCORE 26 ADVANTAGE (KITS) ×1 IMPLANT
KIT MICROINTRODUCER STIFF 5F (SHEATH) ×1 IMPLANT
PROTECTION STATION PRESSURIZED (MISCELLANEOUS) ×2
SHEATH PINNACLE R/O II 7F 4CM (SHEATH) ×1 IMPLANT
STATION PROTECTION PRESSURIZED (MISCELLANEOUS) ×1 IMPLANT
STOPCOCK MORSE 400PSI 3WAY (MISCELLANEOUS) ×2 IMPLANT
TRAY PV CATH (CUSTOM PROCEDURE TRAY) ×2 IMPLANT
TUBING CIL FLEX 10 FLL-RA (TUBING) ×2 IMPLANT
WIRE BENTSON .035X145CM (WIRE) ×1 IMPLANT
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
## 2017-09-25 NOTE — Interval H&P Note (Signed)
   History and Physical Update  The patient was interviewed and re-examined.  The patient's previous History and Physical has been reviewed and is unchanged from my consult.  There is no change in the plan of care: RIGHT arm fistulogram, possible intervention.   I discussed with the patient the nature of angiographic procedures, especially the limited patencies of any endovascular intervention.    The patient is aware of that the risks of an angiographic procedure include but are not limited to: bleeding, infection, access site complications, renal failure, embolization, rupture of vessel, dissection, arteriovenous fistula, possible need for emergent surgical intervention, possible need for surgical procedures to treat the patient's pathology, anaphylactic reaction to contrast, and stroke and death.    The patient is aware of the risks and agrees to proceed.   Adele Barthel, MD, FACS [facility] [place] Office: [phone] Pager: [phone]  09/25/2017, 11:08 AM

## 2017-09-25 NOTE — Progress Notes (Signed)
Dr Bridgett Larsson notified of K+ 6.1 and order noted

## 2017-09-25 NOTE — Op Note (Signed)
OPERATIVE NOTE   PROCEDURE: 1.  right brachiocephalic arteriovenous fistula cannulation under ultrasound guidance 2.  left arm shuntogram 3.  Venoplasty of cephalic vein, axillary vein, subclavian vein and right innominate vein x 2 (6 mm x 80 mm, 8 mm x 80 mm))   PRE-OPERATIVE DIAGNOSIS: left brachiocephalic arteriovenous fistula with poor flow rates   POST-OPERATIVE DIAGNOSIS: same as above   SURGEON: Adele Barthel, MD  ANESTHESIA: local  ESTIMATED BLOOD LOSS: 50 cc  FINDING(S): 1. Large patent left brachiocephalic arteriovenous fistula with heavy calcification at perianastomotic segment with 50-75% stenosis in proximal segment 2. Patent axillary vein with 50% stenosis at confluence with cephalic vein 3. Patent right subclavian vein with 50-75% stenosis extending into right innominate vein 4. >6 mm lumen throughout fistula and central veins after serial venoplasty 5. Improved thrill in fistula at end of case  SPECIMEN(S):  None  CONTRAST: 35 cc   INDICATIONS: Brian [name] is a 41 y.o. male who presents with right brachiocephalic arteriovenous fistula with poor flow rates.  The patient is scheduled for right arm shuntogram and possible intervention.  The patient is aware of that the risks of an angiographic procedure include but are not limited to: bleeding, infection, access site complications, thrombosis of access, renal failure, embolization, rupture of vessel, dissection, arteriovenous fistula, possible need for emergent surgical intervention, possible need for surgical procedures to treat the patient's pathology, anaphylactic reaction to contrast, and stroke and death.  The patient is aware of the risks of the procedure and elects to proceed forward.   DESCRIPTION: After full informed written consent was obtained, the patient was brought back to the angiography suite and placed supine upon the angiography table.  The patient was connected to monitoring equipment.  The  right upper arm was prepped and draped in the standard fashion for a percutaneous access intervention.  Under ultrasound guidance, the right brachiocephalic arteriovenous fistula was cannulated with a micropuncture needle.  The microwire was advanced into the fistula and the needle was exchanged for the a microsheath, which was lodged 2 cm into the access.  The wire was removed and the sheath was connected to the IV extension tubing.  Hand injections were completed to image the access from the cannulation site up the right atrium.  Based on the images, this patient will need: venoplasty of the proximal fistula and central venous structures.  The patient was given 3000 units of Heparin intravenously to obtain some anticoagulation.  A Bentson wire was advanced into the proximal cephalic vein and the sheath was exchanged for a short 7-Fr sheath.  I placed a BER-2 catheter over the wire and used this combination to steer into the axillary vein and advance into the right atrium.    Based on the imaging, a 6 mm x 80 mm angioplasty balloon was selected.  The balloon was centered around the cephalic and axillary vein stenoses and inflated to 18 ATM for 2 minutes.  I repeated this in the right subclavian vein extending into the right innominate vein at 18 ATM for 2 minutes.  Three waists corresponding to previously determined venous stenosis were found with inflation.  At this point, the balloon was exchanged for a 8 mm x 80 mm angioplasty balloon.  I inflated the balloon centered on the cephalic and axillary vein stenoses at 14 ATM for 2 minutes and then deflated to 4 ATM for 2 minutes.  I repeated this in the right subclavian vein extending into the right innominate  at 14 ATM for 2 minutes and then deflated to 4 ATM for 2 minutes.  I did a completion injection which demonstrated the fistula and central veins had lumens >6 mm throughout.  Distally the fistula's thrill was also notably better.  Based on the completion  imaging, no further intervention is necessary.  The wire and balloon were removed from the sheath.  A 4-0 Monocryl purse-string suture was sewn around the sheath.  The sheath was removed while tying down the suture.  A sterile bandage was applied to the puncture site.   COMPLICATIONS: none  CONDITION: stable   Adele Barthel, MD, [facility] [place] Office: [phone] Pager: [phone]  09/25/2017 1:53 PM

## 2017-09-25 NOTE — Discharge Instructions (Signed)
AV Fistula Placement, Care After °Refer to this sheet in the next few weeks. These instructions provide you with information about caring for yourself after your procedure. Your health care provider may also give you more specific instructions. Your treatment has been planned according to current medical practices, but problems sometimes occur. Call your health care provider if you have any problems or questions after your procedure. °What can I expect after the procedure? °After your procedure, it is common to: °· Feel sore. °· Have numbness. °· Feel cold. °· Feel a vibration (thrill) over the fistula. ° °Follow these instructions at home: ° °Incision care °· Do not take baths or showers, swim, or use a hot tub until your health care provider approves. °· Keep the area around your cut from surgery (incision) clean and dry. °· Follow instructions from your health care provider about how to take care of your incision. Make sure you: °? Wash your hands with soap and water before you change your bandage (dressing). If soap and water are not available, use hand sanitizer. °? Change your dressing as told by your health care provider. °? Leave stitches (sutures) and clips in place. They may need to stay in place for 2 weeks or longer. °Fistula Care °· Check your fistula site every day to make sure the thrill feels the same. °· Check your fistula site every day for signs of infection. Watch for: °? Redness. °? Swelling. °? Discharge. °? Tenderness. °? Enlargement. °· Keep your arm raised (elevated) while you rest. °· Do not lift anything that is heavier than a gallon of milk with the arm that has the fistula. °· Do not lie down on your fistula arm. °· Do not let anyone draw blood or take a blood pressure reading on your fistula arm. °· Do not wear tight jewelry or clothing over your fistula arm. °General instructions °· Rest at home for a day or two. °· Gradually start doing your usual activities again. Ask your surgeon  when you can return to work or school. °· Take over-the-counter and prescription medicines only as told by your health care provider. °· Keep all follow-up visits as told by your health care provider. This is important. °Contact a health care provider if: °· You have chills or a fever. °· You have pain at your fistula site that is not going away. °· You have numbness or coldness at your fistula site that is not going away. °· You feel a decrease or a change in the thrill. °· You have swelling in your arm or hand. °· You have redness, swelling, discharge, tenderness, or enlargement at your fistula site. °Get help right away if: °· You are bleeding from your fistula site. °· You have chest pain. °· You have trouble breathing. °This information is not intended to replace advice given to you by your health care provider. Make sure you discuss any questions you have with your health care provider. °Document Released: 12/02/2005 Document Revised: 04/10/2016 Document Reviewed: 02/22/2015 °Elsevier Interactive Patient Education © 2018 [employer] Inc. ° °

## 2017-09-26 ENCOUNTER — Encounter (HOSPITAL_COMMUNITY): Payer: Self-pay | Admitting: Vascular Surgery

## 2017-09-26 DIAGNOSIS — N2581 Secondary hyperparathyroidism of renal origin: Secondary | ICD-10-CM | POA: Diagnosis not present

## 2017-09-26 DIAGNOSIS — D509 Iron deficiency anemia, unspecified: Secondary | ICD-10-CM | POA: Diagnosis not present

## 2017-09-26 DIAGNOSIS — Z23 Encounter for immunization: Secondary | ICD-10-CM | POA: Diagnosis not present

## 2017-09-26 DIAGNOSIS — T82590D Other mechanical complication of surgically created arteriovenous fistula, subsequent encounter: Secondary | ICD-10-CM | POA: Diagnosis not present

## 2017-09-26 DIAGNOSIS — D631 Anemia in chronic kidney disease: Secondary | ICD-10-CM | POA: Diagnosis not present

## 2017-09-26 DIAGNOSIS — N186 End stage renal disease: Secondary | ICD-10-CM | POA: Diagnosis not present

## 2017-10-01 DIAGNOSIS — Z23 Encounter for immunization: Secondary | ICD-10-CM | POA: Diagnosis not present

## 2017-10-01 DIAGNOSIS — N2581 Secondary hyperparathyroidism of renal origin: Secondary | ICD-10-CM | POA: Diagnosis not present

## 2017-10-01 DIAGNOSIS — D509 Iron deficiency anemia, unspecified: Secondary | ICD-10-CM | POA: Diagnosis not present

## 2017-10-01 DIAGNOSIS — T82590D Other mechanical complication of surgically created arteriovenous fistula, subsequent encounter: Secondary | ICD-10-CM | POA: Diagnosis not present

## 2017-10-01 DIAGNOSIS — D631 Anemia in chronic kidney disease: Secondary | ICD-10-CM | POA: Diagnosis not present

## 2017-10-01 DIAGNOSIS — N186 End stage renal disease: Secondary | ICD-10-CM | POA: Diagnosis not present

## 2017-10-03 DIAGNOSIS — N186 End stage renal disease: Secondary | ICD-10-CM | POA: Diagnosis not present

## 2017-10-03 DIAGNOSIS — N2581 Secondary hyperparathyroidism of renal origin: Secondary | ICD-10-CM | POA: Diagnosis not present

## 2017-10-03 DIAGNOSIS — D509 Iron deficiency anemia, unspecified: Secondary | ICD-10-CM | POA: Diagnosis not present

## 2017-10-03 DIAGNOSIS — Z23 Encounter for immunization: Secondary | ICD-10-CM | POA: Diagnosis not present

## 2017-10-03 DIAGNOSIS — T82590D Other mechanical complication of surgically created arteriovenous fistula, subsequent encounter: Secondary | ICD-10-CM | POA: Diagnosis not present

## 2017-10-03 DIAGNOSIS — D631 Anemia in chronic kidney disease: Secondary | ICD-10-CM | POA: Diagnosis not present

## 2017-10-06 DIAGNOSIS — D631 Anemia in chronic kidney disease: Secondary | ICD-10-CM | POA: Diagnosis not present

## 2017-10-06 DIAGNOSIS — D509 Iron deficiency anemia, unspecified: Secondary | ICD-10-CM | POA: Diagnosis not present

## 2017-10-06 DIAGNOSIS — N186 End stage renal disease: Secondary | ICD-10-CM | POA: Diagnosis not present

## 2017-10-06 DIAGNOSIS — Z23 Encounter for immunization: Secondary | ICD-10-CM | POA: Diagnosis not present

## 2017-10-06 DIAGNOSIS — T82590D Other mechanical complication of surgically created arteriovenous fistula, subsequent encounter: Secondary | ICD-10-CM | POA: Diagnosis not present

## 2017-10-06 DIAGNOSIS — N2581 Secondary hyperparathyroidism of renal origin: Secondary | ICD-10-CM | POA: Diagnosis not present

## 2017-10-08 DIAGNOSIS — D631 Anemia in chronic kidney disease: Secondary | ICD-10-CM | POA: Diagnosis not present

## 2017-10-08 DIAGNOSIS — Z23 Encounter for immunization: Secondary | ICD-10-CM | POA: Diagnosis not present

## 2017-10-08 DIAGNOSIS — T82590D Other mechanical complication of surgically created arteriovenous fistula, subsequent encounter: Secondary | ICD-10-CM | POA: Diagnosis not present

## 2017-10-08 DIAGNOSIS — N186 End stage renal disease: Secondary | ICD-10-CM | POA: Diagnosis not present

## 2017-10-08 DIAGNOSIS — D509 Iron deficiency anemia, unspecified: Secondary | ICD-10-CM | POA: Diagnosis not present

## 2017-10-08 DIAGNOSIS — N2581 Secondary hyperparathyroidism of renal origin: Secondary | ICD-10-CM | POA: Diagnosis not present

## 2017-10-09 ENCOUNTER — Ambulatory Visit: Payer: Medicare Other | Admitting: Pharmacist

## 2017-10-10 DIAGNOSIS — D509 Iron deficiency anemia, unspecified: Secondary | ICD-10-CM | POA: Diagnosis not present

## 2017-10-10 DIAGNOSIS — N186 End stage renal disease: Secondary | ICD-10-CM | POA: Diagnosis not present

## 2017-10-10 DIAGNOSIS — N2581 Secondary hyperparathyroidism of renal origin: Secondary | ICD-10-CM | POA: Diagnosis not present

## 2017-10-10 DIAGNOSIS — D631 Anemia in chronic kidney disease: Secondary | ICD-10-CM | POA: Diagnosis not present

## 2017-10-10 DIAGNOSIS — T82590D Other mechanical complication of surgically created arteriovenous fistula, subsequent encounter: Secondary | ICD-10-CM | POA: Diagnosis not present

## 2017-10-10 DIAGNOSIS — Z23 Encounter for immunization: Secondary | ICD-10-CM | POA: Diagnosis not present

## 2017-10-13 ENCOUNTER — Encounter (HOSPITAL_COMMUNITY): Payer: Self-pay | Admitting: Vascular Surgery

## 2017-10-13 DIAGNOSIS — N2581 Secondary hyperparathyroidism of renal origin: Secondary | ICD-10-CM | POA: Diagnosis not present

## 2017-10-13 DIAGNOSIS — D509 Iron deficiency anemia, unspecified: Secondary | ICD-10-CM | POA: Diagnosis not present

## 2017-10-13 DIAGNOSIS — T82590D Other mechanical complication of surgically created arteriovenous fistula, subsequent encounter: Secondary | ICD-10-CM | POA: Diagnosis not present

## 2017-10-13 DIAGNOSIS — D631 Anemia in chronic kidney disease: Secondary | ICD-10-CM | POA: Diagnosis not present

## 2017-10-13 DIAGNOSIS — N186 End stage renal disease: Secondary | ICD-10-CM | POA: Diagnosis not present

## 2017-10-13 DIAGNOSIS — Z23 Encounter for immunization: Secondary | ICD-10-CM | POA: Diagnosis not present

## 2017-10-13 NOTE — Addendum Note (Signed)
Addendum  created 10/13/17 1536 by Josephine Igo, CRNA   Anesthesia Event deleted, Anesthesia Event edited, SmartForm saved

## 2017-10-14 ENCOUNTER — Ambulatory Visit (HOSPITAL_COMMUNITY)
Admission: RE | Admit: 2017-10-14 | Discharge: 2017-10-14 | Disposition: A | Discharge status: Home / Self Care | Payer: Medicare Other | Source: Ambulatory Visit | Attending: Vascular Surgery | Admitting: Vascular Surgery

## 2017-10-14 ENCOUNTER — Ambulatory Visit (INDEPENDENT_AMBULATORY_CARE_PROVIDER_SITE_OTHER): Payer: Medicare Other | Admitting: Pharmacist

## 2017-10-14 ENCOUNTER — Encounter: Payer: Self-pay | Admitting: Vascular Surgery

## 2017-10-14 ENCOUNTER — Ambulatory Visit (INDEPENDENT_AMBULATORY_CARE_PROVIDER_SITE_OTHER): Payer: Self-pay | Admitting: Vascular Surgery

## 2017-10-14 VITALS — BP 182/113 | HR 93 | Temp 97.4°F | Resp 16 | Ht 75.0 in | Wt 208.0 lb

## 2017-10-14 DIAGNOSIS — Z48812 Encounter for surgical aftercare following surgery on the circulatory system: Secondary | ICD-10-CM

## 2017-10-14 DIAGNOSIS — Z7901 Long term (current) use of anticoagulants: Secondary | ICD-10-CM | POA: Diagnosis not present

## 2017-10-14 DIAGNOSIS — Z86718 Personal history of other venous thrombosis and embolism: Secondary | ICD-10-CM | POA: Diagnosis not present

## 2017-10-14 DIAGNOSIS — I824Y9 Acute embolism and thrombosis of unspecified deep veins of unspecified proximal lower extremity: Secondary | ICD-10-CM

## 2017-10-14 DIAGNOSIS — N186 End stage renal disease: Secondary | ICD-10-CM

## 2017-10-14 DIAGNOSIS — Z86711 Personal history of pulmonary embolism: Secondary | ICD-10-CM | POA: Diagnosis present

## 2017-10-14 LAB — POCT INR: INR: 1.8

## 2017-10-14 NOTE — Patient Instructions (Signed)
Patient educated about medication as defined in this encounter and verbalized understanding by repeating back instructions provided.   
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
## 2017-10-14 NOTE — Progress Notes (Signed)
Patient name: Brian [name] MRN: [id number] DOB: 09/05/1976 Sex: male  REASON FOR VISIT: Follow-up left forearm radio basilic transposition fistula  HPI: Brian [name] is a 41 y.o. male patient is here for follow-up.  He had transposition basilic vein fistula in his forearm to his radial artery on 18.  He reports minimal discomfort associated with it.  Current Outpatient Prescriptions  Medication Sig Dispense Refill  . BIOTIN PO Take 1 tablet by mouth at bedtime.    . calcium acetate (PHOSLO) 667 MG capsule Take 667 mg by mouth See admin instructions. Take 667 mg by mouth daily with biggest meal and take 667 mg by mouth with snacks    . dolutegravir (TIVICAY) 50 MG tablet Take 1 tablet (50 mg total) by mouth daily. 30 tablet 5  . dronabinol (MARINOL) 5 MG capsule Take 1 capsule (5 mg total) by mouth 2 (two) times daily before lunch and supper. Do not fill before 30 days from prior 60 capsule 0  . emtricitabine-tenofovir AF (DESCOVY) 200-25 MG tablet Take 1 tablet by mouth daily. 30 tablet 5  . fluticasone (FLONASE) 50 MCG/ACT nasal spray instill TWO SPRAYS in each nostril daily as needed for allergies (Patient taking differently: Place 3 sprays into both nostrils daily. )    . gabapentin (NEURONTIN) 300 MG capsule Take 300 mg by mouth twice daily (Patient taking differently: Take 300 mg by mouth 3 (three) times daily. )    . GENERLAC 10 GM/15ML SOLN Take 20 g by mouth daily.  3  . neomycin-bacitracin-polymyxin (NEOSPORIN) ointment Apply 1 application topically as needed for wound care.    . neomycin-polymyxin b-dexamethasone (MAXITROL) 3.5-10000-0.1 SUSP Place 3 drops into both eyes daily.    [name] oxyCODONE-acetaminophen (PERCOCET) 10-325 MG tablet Take 1 tablet by mouth every 8 (eight) hours as needed for pain. Do not fill before 30 days from prior 90 tablet 0  . pramipexole (MIRAPEX) 0.25 MG tablet Take 2 tablets (0.5 mg total) by mouth at bedtime. 180 tablet  3  . PROAIR HFA 108 (90 BASE) MCG/ACT inhaler INHALE 2 PUFFS EVERY 4  HRS AS NEEDED FOR WHEEZING SHORTNESS OF BREATH (Patient taking differently: INHALE 3 PUFFS EVERY 4  HRS AS NEEDED FOR WHEEZING SHORTNESS OF BREATH) 8 g 3  . warfarin (COUMADIN) 5 MG tablet Take 1.5-2 tablets (7.5-10 mg total) by mouth See admin instructions. Take 10 mg by mouth daily on Sunday, Tuesday and Thursday. Take 7.5 mg by mouth daily on all other days (Patient taking differently: Take 7.5-10 mg by mouth See admin instructions. Take 10 mg by mouth daily on Monday, Wednesday and Friday. Take 7.5 mg by mouth daily on all other days)     No current facility-administered medications for this visit.      PHYSICAL EXAM: Vitals:   10/14/17 1602  BP: (!) 182/113  Pulse: 93  Resp: 16  Temp: (!) 97.4 F (36.3 C)  TempSrc: Oral  SpO2: 97%  Weight: 208 lb (94.3 kg)  Height: 6\' 3"  (1.905 m)    GENERAL: The patient is a well-nourished male, in no acute distress. The vital signs are documented above. All surgical incisions on his left arm.  Excellent size maturation very superficial straight fistula from the radial artery to the antecubital space.  MEDICAL ISSUES: Stable.  Would wait a total of 3 months prior to using this fistula.  This would place it in mid December.  He is using his upper arm cephalic vein fistula on the  right currently.  He has had some difficulty with this with a venous outflow stenosis and underwent recent angioplasty with better flow.  He will see [place] again on an as-needed basis.  If he continues to have discomfort associated with his right arm AV fistula, this could be ligated in the future but I did explain that it would be nice to have this patient as well as a backup if he is able to tolerate the discomfort of it not being used   Rosetta Posner, MD FACS [facility] Tel [phone] Pager [phone]

## 2017-10-15 DIAGNOSIS — N2581 Secondary hyperparathyroidism of renal origin: Secondary | ICD-10-CM | POA: Diagnosis not present

## 2017-10-15 DIAGNOSIS — D631 Anemia in chronic kidney disease: Secondary | ICD-10-CM | POA: Diagnosis not present

## 2017-10-15 DIAGNOSIS — Z23 Encounter for immunization: Secondary | ICD-10-CM | POA: Diagnosis not present

## 2017-10-15 DIAGNOSIS — I120 Hypertensive chronic kidney disease with stage 5 chronic kidney disease or end stage renal disease: Secondary | ICD-10-CM | POA: Diagnosis not present

## 2017-10-15 DIAGNOSIS — Z992 Dependence on renal dialysis: Secondary | ICD-10-CM | POA: Diagnosis not present

## 2017-10-15 DIAGNOSIS — T82590D Other mechanical complication of surgically created arteriovenous fistula, subsequent encounter: Secondary | ICD-10-CM | POA: Diagnosis not present

## 2017-10-15 DIAGNOSIS — N186 End stage renal disease: Secondary | ICD-10-CM | POA: Diagnosis not present

## 2017-10-15 DIAGNOSIS — D509 Iron deficiency anemia, unspecified: Secondary | ICD-10-CM | POA: Diagnosis not present

## 2017-10-17 DIAGNOSIS — D631 Anemia in chronic kidney disease: Secondary | ICD-10-CM | POA: Diagnosis not present

## 2017-10-17 DIAGNOSIS — N186 End stage renal disease: Secondary | ICD-10-CM | POA: Diagnosis not present

## 2017-10-17 DIAGNOSIS — N2581 Secondary hyperparathyroidism of renal origin: Secondary | ICD-10-CM | POA: Diagnosis not present

## 2017-10-17 DIAGNOSIS — D509 Iron deficiency anemia, unspecified: Secondary | ICD-10-CM | POA: Diagnosis not present

## 2017-10-20 DIAGNOSIS — D631 Anemia in chronic kidney disease: Secondary | ICD-10-CM | POA: Diagnosis not present

## 2017-10-20 DIAGNOSIS — N186 End stage renal disease: Secondary | ICD-10-CM | POA: Diagnosis not present

## 2017-10-20 DIAGNOSIS — D509 Iron deficiency anemia, unspecified: Secondary | ICD-10-CM | POA: Diagnosis not present

## 2017-10-20 DIAGNOSIS — N2581 Secondary hyperparathyroidism of renal origin: Secondary | ICD-10-CM | POA: Diagnosis not present

## 2017-10-22 DIAGNOSIS — N186 End stage renal disease: Secondary | ICD-10-CM | POA: Diagnosis not present

## 2017-10-22 DIAGNOSIS — D631 Anemia in chronic kidney disease: Secondary | ICD-10-CM | POA: Diagnosis not present

## 2017-10-22 DIAGNOSIS — D509 Iron deficiency anemia, unspecified: Secondary | ICD-10-CM | POA: Diagnosis not present

## 2017-10-22 DIAGNOSIS — N2581 Secondary hyperparathyroidism of renal origin: Secondary | ICD-10-CM | POA: Diagnosis not present

## 2017-10-24 DIAGNOSIS — D631 Anemia in chronic kidney disease: Secondary | ICD-10-CM | POA: Diagnosis not present

## 2017-10-24 DIAGNOSIS — N186 End stage renal disease: Secondary | ICD-10-CM | POA: Diagnosis not present

## 2017-10-24 DIAGNOSIS — N2581 Secondary hyperparathyroidism of renal origin: Secondary | ICD-10-CM | POA: Diagnosis not present

## 2017-10-24 DIAGNOSIS — D509 Iron deficiency anemia, unspecified: Secondary | ICD-10-CM | POA: Diagnosis not present

## 2017-10-27 DIAGNOSIS — D509 Iron deficiency anemia, unspecified: Secondary | ICD-10-CM | POA: Diagnosis not present

## 2017-10-27 DIAGNOSIS — N2581 Secondary hyperparathyroidism of renal origin: Secondary | ICD-10-CM | POA: Diagnosis not present

## 2017-10-27 DIAGNOSIS — N186 End stage renal disease: Secondary | ICD-10-CM | POA: Diagnosis not present

## 2017-10-27 DIAGNOSIS — D631 Anemia in chronic kidney disease: Secondary | ICD-10-CM | POA: Diagnosis not present

## 2017-10-29 DIAGNOSIS — N2581 Secondary hyperparathyroidism of renal origin: Secondary | ICD-10-CM | POA: Diagnosis not present

## 2017-10-29 DIAGNOSIS — D509 Iron deficiency anemia, unspecified: Secondary | ICD-10-CM | POA: Diagnosis not present

## 2017-10-29 DIAGNOSIS — N186 End stage renal disease: Secondary | ICD-10-CM | POA: Diagnosis not present

## 2017-10-29 DIAGNOSIS — D631 Anemia in chronic kidney disease: Secondary | ICD-10-CM | POA: Diagnosis not present

## 2017-10-31 DIAGNOSIS — D509 Iron deficiency anemia, unspecified: Secondary | ICD-10-CM | POA: Diagnosis not present

## 2017-10-31 DIAGNOSIS — N2581 Secondary hyperparathyroidism of renal origin: Secondary | ICD-10-CM | POA: Diagnosis not present

## 2017-10-31 DIAGNOSIS — N186 End stage renal disease: Secondary | ICD-10-CM | POA: Diagnosis not present

## 2017-10-31 DIAGNOSIS — D631 Anemia in chronic kidney disease: Secondary | ICD-10-CM | POA: Diagnosis not present

## 2017-11-02 DIAGNOSIS — D631 Anemia in chronic kidney disease: Secondary | ICD-10-CM | POA: Diagnosis not present

## 2017-11-02 DIAGNOSIS — N2581 Secondary hyperparathyroidism of renal origin: Secondary | ICD-10-CM | POA: Diagnosis not present

## 2017-11-02 DIAGNOSIS — N186 End stage renal disease: Secondary | ICD-10-CM | POA: Diagnosis not present

## 2017-11-02 DIAGNOSIS — D509 Iron deficiency anemia, unspecified: Secondary | ICD-10-CM | POA: Diagnosis not present

## 2017-11-04 DIAGNOSIS — D631 Anemia in chronic kidney disease: Secondary | ICD-10-CM | POA: Diagnosis not present

## 2017-11-04 DIAGNOSIS — N2581 Secondary hyperparathyroidism of renal origin: Secondary | ICD-10-CM | POA: Diagnosis not present

## 2017-11-04 DIAGNOSIS — D509 Iron deficiency anemia, unspecified: Secondary | ICD-10-CM | POA: Diagnosis not present

## 2017-11-04 DIAGNOSIS — N186 End stage renal disease: Secondary | ICD-10-CM | POA: Diagnosis not present

## 2017-11-07 DIAGNOSIS — D631 Anemia in chronic kidney disease: Secondary | ICD-10-CM | POA: Diagnosis not present

## 2017-11-07 DIAGNOSIS — D509 Iron deficiency anemia, unspecified: Secondary | ICD-10-CM | POA: Diagnosis not present

## 2017-11-07 DIAGNOSIS — N186 End stage renal disease: Secondary | ICD-10-CM | POA: Diagnosis not present

## 2017-11-07 DIAGNOSIS — N2581 Secondary hyperparathyroidism of renal origin: Secondary | ICD-10-CM | POA: Diagnosis not present

## 2017-11-11 ENCOUNTER — Ambulatory Visit (INDEPENDENT_AMBULATORY_CARE_PROVIDER_SITE_OTHER): Payer: Medicare Other | Admitting: Pharmacist

## 2017-11-11 ENCOUNTER — Ambulatory Visit (INDEPENDENT_AMBULATORY_CARE_PROVIDER_SITE_OTHER): Payer: Medicare Other | Admitting: Internal Medicine

## 2017-11-11 ENCOUNTER — Other Ambulatory Visit: Payer: Self-pay

## 2017-11-11 ENCOUNTER — Encounter: Payer: Self-pay | Admitting: Internal Medicine

## 2017-11-11 VITALS — BP 158/61 | HR 63 | Temp 97.7°F | Wt 227.2 lb

## 2017-11-11 DIAGNOSIS — Z9109 Other allergy status, other than to drugs and biological substances: Secondary | ICD-10-CM

## 2017-11-11 DIAGNOSIS — I712 Thoracic aortic aneurysm, without rupture, unspecified: Secondary | ICD-10-CM

## 2017-11-11 DIAGNOSIS — Z86711 Personal history of pulmonary embolism: Secondary | ICD-10-CM

## 2017-11-11 DIAGNOSIS — Z992 Dependence on renal dialysis: Secondary | ICD-10-CM

## 2017-11-11 DIAGNOSIS — Z79899 Other long term (current) drug therapy: Secondary | ICD-10-CM | POA: Diagnosis not present

## 2017-11-11 DIAGNOSIS — Z7901 Long term (current) use of anticoagulants: Secondary | ICD-10-CM

## 2017-11-11 DIAGNOSIS — N186 End stage renal disease: Secondary | ICD-10-CM | POA: Diagnosis not present

## 2017-11-11 DIAGNOSIS — G2581 Restless legs syndrome: Secondary | ICD-10-CM

## 2017-11-11 DIAGNOSIS — K429 Umbilical hernia without obstruction or gangrene: Secondary | ICD-10-CM

## 2017-11-11 DIAGNOSIS — F17210 Nicotine dependence, cigarettes, uncomplicated: Secondary | ICD-10-CM

## 2017-11-11 DIAGNOSIS — I5022 Chronic systolic (congestive) heart failure: Secondary | ICD-10-CM

## 2017-11-11 DIAGNOSIS — R011 Cardiac murmur, unspecified: Secondary | ICD-10-CM

## 2017-11-11 DIAGNOSIS — B20 Human immunodeficiency virus [HIV] disease: Secondary | ICD-10-CM | POA: Diagnosis not present

## 2017-11-11 DIAGNOSIS — Q613 Polycystic kidney, unspecified: Secondary | ICD-10-CM | POA: Diagnosis not present

## 2017-11-11 DIAGNOSIS — J449 Chronic obstructive pulmonary disease, unspecified: Secondary | ICD-10-CM | POA: Diagnosis not present

## 2017-11-11 DIAGNOSIS — Z79891 Long term (current) use of opiate analgesic: Secondary | ICD-10-CM | POA: Diagnosis not present

## 2017-11-11 DIAGNOSIS — F17200 Nicotine dependence, unspecified, uncomplicated: Secondary | ICD-10-CM

## 2017-11-11 DIAGNOSIS — Z86718 Personal history of other venous thrombosis and embolism: Secondary | ICD-10-CM | POA: Diagnosis not present

## 2017-11-11 DIAGNOSIS — J329 Chronic sinusitis, unspecified: Secondary | ICD-10-CM | POA: Diagnosis present

## 2017-11-11 DIAGNOSIS — G8929 Other chronic pain: Secondary | ICD-10-CM

## 2017-11-11 LAB — POCT INR: INR: 2

## 2017-11-11 MED ORDER — DRONABINOL 5 MG PO CAPS: 5.0000 mg | ORAL_CAPSULE | Freq: Two times a day (BID) | ORAL | 0 refills | Status: DC

## 2017-11-11 MED ORDER — BUPROPION HCL 100 MG PO TABS: 100.0000 mg | ORAL_TABLET | Freq: Every day | ORAL | 0 refills | Status: DC

## 2017-11-11 MED ORDER — OXYCODONE-ACETAMINOPHEN 10-325 MG PO TABS: 1.0000 | ORAL_TABLET | Freq: Three times a day (TID) | ORAL | 0 refills | Status: DC | PRN

## 2017-11-11 NOTE — Assessment & Plan Note (Signed)
Blood pressure on initial presentation today was 205/109 then improved to 158/61 when he lay down, to note this was a pressure taken in his lower leg. He has had well controlled blood pressure in the past so this prompted me to ask about dialysis. He says he has had missed some sessions recently and for the past year has had to cut sessions back to 3 hours because of cramping. He weighs 227 lbs today this up from his known dry weight of 200 lbs which is where he was six weeks ago based on chart review. He is having dyspnea and lower extremity edema. Despite the fluid accumulation, he reports that he consistently becomes hypotensive about 2 hours into HD and and is hypotensive on BP check after the session is complete. This hypotension has limited my use of beta blocker therapy in the past.  This volume overload is probably related to missed HD in the setting of known systolic heart failure with EF 20-25% and G1DD. We discussed the concern for him needing admission but Brian [name] says that he will attend dialysis and mention these concerns to his nephrologist tomorrow.  - Asked him to discuss the option of taking a blood pressure medication on non dialysis days with his nephrologist  - Encouraged HD compliance  - ordered echocardiogram  - RTC in 2 weeks, or sooner if he develops orthopnea or chest discomfort

## 2017-11-11 NOTE — Assessment & Plan Note (Addendum)
Describes two days of itchy ears, post nasal drip, sinus headache and rhinorrhea. Chronic cough is not worse than usual and he has no fevers.  - encouraged use of sinus washes and flonase  - will call the clinic if symptoms are not improved after 2 weeks  From office visit 08/12/2017:   Symptoms well controlled on current medications.  - continue flonase and zyrtec

## 2017-11-11 NOTE — Assessment & Plan Note (Signed)
From office visit 08/12/2017   Controlled on pramipexole  - continue pramipexole 0.5 mg qhs

## 2017-11-11 NOTE — Assessment & Plan Note (Signed)
Brian [name] is on chronic opioid therapy for chronic pain. The date of the controlled substances contract is referenced in the [place] and / or the overview. Date of pain contract was 07/2017. As part of the treatment plan, the [place] controlled substance database is checked at least twice yearly and the database results are appropriate. I have last reviewed the results on today (11/11/2017). The last UDS was on 02/04/2017 and results are as expected.   The patient is on oxycodone/acetaminophen (Percocet, Tylox) strength 10-325, 90 per 30 days and dronabinol 5 mg BID. Adjunctive treatment includes Muscle relaxants. This regimen allows Brian [name] to function and does not cause excessive sedation or other side effects.  "The benefits of continuing opioid therapy outweigh the risks and chronic opioids will be continued. Ongoing education about safe opioid treatment is provided  Interventions today include: Refills - 3 paper Rx printed

## 2017-11-11 NOTE — Assessment & Plan Note (Signed)
Followed up with [facility] pharmacy 8/18, he was experiencing insomnia and vivid dreams related to efavirenz so this,abacavir, and lamivudine were stopped and he was switched to tivicay and descovy. HIV viral load was undetectable at that time and CD4 count 45.

## 2017-11-11 NOTE — Assessment & Plan Note (Signed)
From office visit 08/12/2017:  Denies constipation or abdominal pain. He does describe his stools as being soft and unformed and that this makes them difficult to pass..  - continue to monitor  - provided list of high fiber fruits and vegetables which are low in K (CKD)  - continue lactulose PRN
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
## 2017-11-11 NOTE — Patient Instructions (Addendum)
It was a pleasure to see you today Brian [name]  - we are very concerned about your high blood pressure - be sure to mention this to Dr. Jimmy Footman at dialysis and ask if you can try taking a blood pressure medicine on non dialysis days  - For your aneurysm - I have ordered a CT scan of your chest, abdomen, and pelvis  - For your heart failure - I have ordered an echocardiogram to check on these again  - Please be sure to make sure these scans are scheduled, if you have not heard back about them in the next week call this office and ask for an update  - for your history of blood clots- please call the company that has provided your INR monitor to get the supplies that you need and start using it at home  - for your congestion, try using a neti pot, keep using the flonase and zyrtec   FOLLOW-UP INSTRUCTIONS When: 2 weeks  For: hypertension  What to bring: medication bottles   - Please call our clinic if you have any problems or questions, we may be able to help you and keep you from a long emergency room wait. Our clinic and after hours phone number is [phone]    DASH Eating Plan Brian [name] stands for "Dietary Approaches to Stop Hypertension." The DASH eating plan is a healthy eating plan that has been shown to reduce high blood pressure (hypertension). It may also reduce your risk for type 2 diabetes, heart disease, and stroke. The DASH eating plan may also help with weight loss. What are tips for following this plan? General guidelines  Avoid eating more than 2,300 mg (milligrams) of salt (sodium) a day. If you have hypertension, you may need to reduce your sodium intake to 1,500 mg a day.  Limit alcohol intake to no more than 1 drink a day for nonpregnant women and 2 drinks a day for men. One drink equals 12 oz of beer, 5 oz of wine, or 1 oz of hard liquor.  Work with your health care provider to maintain a healthy body weight or to lose weight. Ask what an ideal weight is for you.  Get at  least 30 minutes of exercise that causes your heart to beat faster (aerobic exercise) most days of the week. Activities may include walking, swimming, or biking.  Work with your health care provider or diet and nutrition specialist (dietitian) to adjust your eating plan to your individual calorie needs. Reading food labels  Check food labels for the amount of sodium per serving. Choose foods with less than 5 percent of the Daily Value of sodium. Generally, foods with less than 300 mg of sodium per serving fit into this eating plan.  To find whole grains, look for the word "whole" as the first word in the ingredient list. Shopping  Buy products labeled as "low-sodium" or "no salt added."  Buy fresh foods. Avoid canned foods and premade or frozen meals. Cooking  Avoid adding salt when cooking. Use salt-free seasonings or herbs instead of table salt or sea salt. Check with your health care provider or pharmacist before using salt substitutes.  Do not fry foods. Cook foods using healthy methods such as baking, boiling, grilling, and broiling instead.  Cook with heart-healthy oils, such as olive, canola, soybean, or sunflower oil. Meal planning   Eat a balanced diet that includes: ? 5 or more servings of fruits and vegetables each day. At each meal, try  to fill half of your plate with fruits and vegetables. ? Up to 6-8 servings of whole grains each day. ? Less than 6 oz of lean meat, poultry, or fish each day. A 3-oz serving of meat is about the same size as a deck of cards. One egg equals 1 oz. ? 2 servings of low-fat dairy each day. ? A serving of nuts, seeds, or beans 5 times each week. ? Heart-healthy fats. Healthy fats called Omega-3 fatty acids are found in foods such as flaxseeds and coldwater fish, like sardines, salmon, and mackerel.  Limit how much you eat of the following: ? Canned or prepackaged foods. ? Food that is high in trans fat, such as fried foods. ? Food that is high  in saturated fat, such as fatty meat. ? Sweets, desserts, sugary drinks, and other foods with added sugar. ? Full-fat dairy products.  Do not salt foods before eating.  Try to eat at least 2 vegetarian meals each week.  Eat more home-cooked food and less restaurant, buffet, and fast food.  When eating at a restaurant, ask that your food be prepared with less salt or no salt, if possible. What foods are recommended? The items listed may not be a complete list. Talk with your dietitian about what dietary choices are best for you. Grains Whole-grain or whole-wheat bread. Whole-grain or whole-wheat pasta. Brown rice. [place] Morrow. Bulgur. Whole-grain and low-sodium cereals. Pita bread. Low-fat, low-sodium crackers. Whole-wheat flour tortillas. Vegetables Fresh or frozen vegetables (raw, steamed, roasted, or grilled). Low-sodium or reduced-sodium tomato and vegetable juice. Low-sodium or reduced-sodium tomato sauce and tomato paste. Low-sodium or reduced-sodium canned vegetables. Fruits All fresh, dried, or frozen fruit. Canned fruit in natural juice (without added sugar). Meat and other protein foods Skinless chicken or [place]. Ground chicken or [place]. Pork with fat trimmed off. Fish and seafood. Egg whites. Dried beans, peas, or lentils. Unsalted nuts, nut butters, and seeds. Unsalted canned beans. Lean cuts of beef with fat trimmed off. Low-sodium, lean deli meat. Dairy Low-fat (1%) or fat-free (skim) milk. Fat-free, low-fat, or reduced-fat cheeses. Nonfat, low-sodium ricotta or cottage cheese. Low-fat or nonfat yogurt. Low-fat, low-sodium cheese. Fats and oils Soft margarine without trans fats. Vegetable oil. Low-fat, reduced-fat, or light mayonnaise and salad dressings (reduced-sodium). Canola, safflower, olive, soybean, and sunflower oils. Avocado. Seasoning and other foods Herbs. Spices. Seasoning mixes without salt. Unsalted popcorn and pretzels. Fat-free sweets. What foods are not  recommended? The items listed may not be a complete list. Talk with your dietitian about what dietary choices are best for you. Grains Baked goods made with fat, such as croissants, muffins, or some breads. Dry pasta or rice meal packs. Vegetables Creamed or fried vegetables. Vegetables in a cheese sauce. Regular canned vegetables (not low-sodium or reduced-sodium). Regular canned tomato sauce and paste (not low-sodium or reduced-sodium). Regular tomato and vegetable juice (not low-sodium or reduced-sodium). Angie Fava. Olives. Fruits Canned fruit in a light or heavy syrup. Fried fruit. Fruit in cream or butter sauce. Meat and other protein foods Fatty cuts of meat. Ribs. Fried meat. Berniece Salines. Sausage. Bologna and other processed lunch meats. Salami. Fatback. Hotdogs. Bratwurst. Salted nuts and seeds. Canned beans with added salt. Canned or smoked fish. Whole eggs or egg yolks. Chicken or [place] with skin. Dairy Whole or 2% milk, cream, and half-and-half. Whole or full-fat cream cheese. Whole-fat or sweetened yogurt. Full-fat cheese. Nondairy creamers. Whipped toppings. Processed cheese and cheese spreads. Fats and oils Butter. Stick margarine. Lard. Shortening. Ghee. Berniece Salines  fat. Tropical oils, such as coconut, palm kernel, or palm oil. Seasoning and other foods Salted popcorn and pretzels. Onion salt, garlic salt, seasoned salt, table salt, and sea salt. Worcestershire sauce. Tartar sauce. Barbecue sauce. Teriyaki sauce. Soy sauce, including reduced-sodium. Steak sauce. Canned and packaged gravies. Fish sauce. Oyster sauce. Cocktail sauce. Horseradish that you find on the shelf. Ketchup. Mustard. Meat flavorings and tenderizers. Bouillon cubes. Hot sauce and Tabasco sauce. Premade or packaged marinades. Premade or packaged taco seasonings. Relishes. Regular salad dressings. Where to find more information:  National Heart, Lung, and [place]: https://wilson-eaton.com/  American Heart Association:  www.heart.org Summary  The DASH eating plan is a healthy eating plan that has been shown to reduce high blood pressure (hypertension). It may also reduce your risk for type 2 diabetes, heart disease, and stroke.  With the DASH eating plan, you should limit salt (sodium) intake to 2,300 mg a day. If you have hypertension, you may need to reduce your sodium intake to 1,500 mg a day.  When on the DASH eating plan, aim to eat more fresh fruits and vegetables, whole grains, lean proteins, low-fat dairy, and heart-healthy fats.  Work with your health care provider or diet and nutrition specialist (dietitian) to adjust your eating plan to your individual calorie needs. This information is not intended to replace advice given to you by your health care provider. Make sure you discuss any questions you have with your health care provider. Document Released: 11/21/2011 Document Revised: 11/25/2016 Document Reviewed: 11/25/2016 Elsevier Interactive Patient Education  2017 [employer].

## 2017-11-11 NOTE — Assessment & Plan Note (Signed)
Is having shortness of breath, peripheral edema, and elevated blood pressure today, this is in the setting of missed HD. Strongly encouraged HD attendance, he is due for a session tomorrow. He is on warfarin for VTE, cannot tolerate BB as he gets hypotensive during HD, not a candidate for ACE/ARB because of ESRD.  - Discussed return precautions, encouraged to schedule extra HD sessions if he is not able to complete sessions
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
## 2017-11-11 NOTE — Assessment & Plan Note (Addendum)
Uncontrolled blood pressure today is concerning with known hx of a 4 cm thoracic aortic aneurysm, he denies symptoms of this today. Last imaging of the thoracic aorta which captured this aneurysm was in 2011, he is in need of at least annual CT or MR angiography for surveillance.  - Ordered CT with contrast of the chest  From office visit 08/12/2017 Denies chest pain or difficulty breathing. He is still in need of imaging for this. Echo was ordered at visit 10/2016, patient has not scheduled this and the order is still available for him to do so. He was also started on metoprolol but this was not tolerated and caused a drop in his blood pressure during HD. I reiterated the risk (including rupture, dissection and death) of leaving this unmonitored and untreated.  - Encouraged scheduling of echo

## 2017-11-11 NOTE — Assessment & Plan Note (Signed)
Wants to cut back on smoking, has been using gum to try and do so. Was able to quit once in the past in the setting of acute illness and he stayed quit for 2 months. Has tried chantix in the past, he did not like it.  -  Prescribed welbutrin 100 mg qd, reduced dosing is for renal impairment

## 2017-11-12 DIAGNOSIS — D509 Iron deficiency anemia, unspecified: Secondary | ICD-10-CM | POA: Diagnosis not present

## 2017-11-12 DIAGNOSIS — D631 Anemia in chronic kidney disease: Secondary | ICD-10-CM | POA: Diagnosis not present

## 2017-11-12 DIAGNOSIS — N186 End stage renal disease: Secondary | ICD-10-CM | POA: Diagnosis not present

## 2017-11-12 DIAGNOSIS — N2581 Secondary hyperparathyroidism of renal origin: Secondary | ICD-10-CM | POA: Diagnosis not present

## 2017-11-12 NOTE — Addendum Note (Signed)
Addended by: Meryl Dare on: 11/12/2017 02:50 PM   Modules accepted: Orders

## 2017-11-12 NOTE — Progress Notes (Signed)
Internal Medicine Clinic Attending  I saw and evaluated the patient.  I personally confirmed the key portions of the history and exam documented by Dr. Blum and I reviewed pertinent patient test results.  The assessment, diagnosis, and plan were formulated together and I agree with the documentation in the resident's note. 

## 2017-11-14 DIAGNOSIS — D509 Iron deficiency anemia, unspecified: Secondary | ICD-10-CM | POA: Diagnosis not present

## 2017-11-14 DIAGNOSIS — Z992 Dependence on renal dialysis: Secondary | ICD-10-CM | POA: Diagnosis not present

## 2017-11-14 DIAGNOSIS — N2581 Secondary hyperparathyroidism of renal origin: Secondary | ICD-10-CM | POA: Diagnosis not present

## 2017-11-14 DIAGNOSIS — I120 Hypertensive chronic kidney disease with stage 5 chronic kidney disease or end stage renal disease: Secondary | ICD-10-CM | POA: Diagnosis not present

## 2017-11-14 DIAGNOSIS — D631 Anemia in chronic kidney disease: Secondary | ICD-10-CM | POA: Diagnosis not present

## 2017-11-14 DIAGNOSIS — N186 End stage renal disease: Secondary | ICD-10-CM | POA: Diagnosis not present

## 2017-11-17 DIAGNOSIS — N2581 Secondary hyperparathyroidism of renal origin: Secondary | ICD-10-CM | POA: Diagnosis not present

## 2017-11-17 DIAGNOSIS — D509 Iron deficiency anemia, unspecified: Secondary | ICD-10-CM | POA: Diagnosis not present

## 2017-11-17 DIAGNOSIS — N186 End stage renal disease: Secondary | ICD-10-CM | POA: Diagnosis not present

## 2017-11-17 DIAGNOSIS — D631 Anemia in chronic kidney disease: Secondary | ICD-10-CM | POA: Diagnosis not present

## 2017-11-19 DIAGNOSIS — N186 End stage renal disease: Secondary | ICD-10-CM | POA: Diagnosis not present

## 2017-11-19 DIAGNOSIS — N2581 Secondary hyperparathyroidism of renal origin: Secondary | ICD-10-CM | POA: Diagnosis not present

## 2017-11-19 DIAGNOSIS — D631 Anemia in chronic kidney disease: Secondary | ICD-10-CM | POA: Diagnosis not present

## 2017-11-19 DIAGNOSIS — D509 Iron deficiency anemia, unspecified: Secondary | ICD-10-CM | POA: Diagnosis not present

## 2017-11-21 DIAGNOSIS — D631 Anemia in chronic kidney disease: Secondary | ICD-10-CM | POA: Diagnosis not present

## 2017-11-21 DIAGNOSIS — N2581 Secondary hyperparathyroidism of renal origin: Secondary | ICD-10-CM | POA: Diagnosis not present

## 2017-11-21 DIAGNOSIS — N186 End stage renal disease: Secondary | ICD-10-CM | POA: Diagnosis not present

## 2017-11-21 DIAGNOSIS — D509 Iron deficiency anemia, unspecified: Secondary | ICD-10-CM | POA: Diagnosis not present

## 2017-11-24 DIAGNOSIS — N2581 Secondary hyperparathyroidism of renal origin: Secondary | ICD-10-CM | POA: Diagnosis not present

## 2017-11-24 DIAGNOSIS — N186 End stage renal disease: Secondary | ICD-10-CM | POA: Diagnosis not present

## 2017-11-24 DIAGNOSIS — D509 Iron deficiency anemia, unspecified: Secondary | ICD-10-CM | POA: Diagnosis not present

## 2017-11-24 DIAGNOSIS — D631 Anemia in chronic kidney disease: Secondary | ICD-10-CM | POA: Diagnosis not present

## 2017-11-25 ENCOUNTER — Ambulatory Visit: Payer: Self-pay | Admitting: Pharmacist

## 2017-11-27 ENCOUNTER — Ambulatory Visit (HOSPITAL_COMMUNITY): Admission: RE | Admit: 2017-11-27 | Payer: Medicare Other | Source: Ambulatory Visit

## 2017-11-28 DIAGNOSIS — D509 Iron deficiency anemia, unspecified: Secondary | ICD-10-CM | POA: Diagnosis not present

## 2017-11-28 DIAGNOSIS — D631 Anemia in chronic kidney disease: Secondary | ICD-10-CM | POA: Diagnosis not present

## 2017-11-28 DIAGNOSIS — N2581 Secondary hyperparathyroidism of renal origin: Secondary | ICD-10-CM | POA: Diagnosis not present

## 2017-11-28 DIAGNOSIS — N186 End stage renal disease: Secondary | ICD-10-CM | POA: Diagnosis not present

## 2017-12-01 DIAGNOSIS — D509 Iron deficiency anemia, unspecified: Secondary | ICD-10-CM | POA: Diagnosis not present

## 2017-12-01 DIAGNOSIS — D631 Anemia in chronic kidney disease: Secondary | ICD-10-CM | POA: Diagnosis not present

## 2017-12-01 DIAGNOSIS — N186 End stage renal disease: Secondary | ICD-10-CM | POA: Diagnosis not present

## 2017-12-01 DIAGNOSIS — N2581 Secondary hyperparathyroidism of renal origin: Secondary | ICD-10-CM | POA: Diagnosis not present

## 2017-12-02 ENCOUNTER — Ambulatory Visit (INDEPENDENT_AMBULATORY_CARE_PROVIDER_SITE_OTHER): Payer: Medicare Other | Admitting: Internal Medicine

## 2017-12-02 ENCOUNTER — Other Ambulatory Visit: Payer: Self-pay

## 2017-12-02 VITALS — BP 212/116 | HR 89 | Ht 73.0 in | Wt 211.4 lb

## 2017-12-02 DIAGNOSIS — R21 Rash and other nonspecific skin eruption: Secondary | ICD-10-CM | POA: Diagnosis not present

## 2017-12-02 DIAGNOSIS — R011 Cardiac murmur, unspecified: Secondary | ICD-10-CM | POA: Diagnosis not present

## 2017-12-02 DIAGNOSIS — F17210 Nicotine dependence, cigarettes, uncomplicated: Secondary | ICD-10-CM | POA: Diagnosis not present

## 2017-12-02 NOTE — Assessment & Plan Note (Signed)
Patient complains of a rash for the past 4-5 days. He states he initially noticed the rash on his lower back and shoulder. The rash subsequently spread to his abdomen, chest, and legs. He initially thought it may have been from poison oak and has been using peroxide and rubbing alcohol on the rash as well as taking frequent hot showers. The rash is not painful. The rash was initially itchy, but this has since subsided. He states that the rash seemed to spread as he scratched it. He endorsed changing his detergent about 2 weeks ago. Denies any other changes to foods, clothing. Denies recent illness, fever, chills.  Rash of unclear etiology. Possible dermatitis from recent detergent change vs dry skin changes exacerbated by vigorous scratching. Pruritis is improving and excoriations from scratching are healing. Patient to perform good skin care and follow up if rash fails to improve. Will consider biopsy if rash persists. - Discontinue application of peroxide and rubbing alcohol - Discontinue frequent hot showers - Start applying moisturizer regularly to skin - Return to previous laundry detergent - Return precautions given
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
## 2017-12-02 NOTE — Patient Instructions (Signed)
Thank you for allowing [place] to care for you.  For your rash: - Stop applying alcohols to your skin and stop taking frequently hot showers - Start applying a moisturizer to your skin (such as Cetaphil) - Change your detergent back to the one you were using previously - Avoid scratching the rash - If you have increased itching and are not able to stop scratching, please call the clinic - If the rash does not continue to improve, return to the clinic

## 2017-12-03 DIAGNOSIS — D509 Iron deficiency anemia, unspecified: Secondary | ICD-10-CM | POA: Diagnosis not present

## 2017-12-03 DIAGNOSIS — N2581 Secondary hyperparathyroidism of renal origin: Secondary | ICD-10-CM | POA: Diagnosis not present

## 2017-12-03 DIAGNOSIS — N186 End stage renal disease: Secondary | ICD-10-CM | POA: Diagnosis not present

## 2017-12-03 DIAGNOSIS — D631 Anemia in chronic kidney disease: Secondary | ICD-10-CM | POA: Diagnosis not present

## 2017-12-04 NOTE — Progress Notes (Signed)
Internal Medicine Clinic Attending  I saw and evaluated the patient.  I personally confirmed the key portions of the history and exam documented by Dr. Trilby Drummer and I reviewed pertinent patient test results.  The assessment, diagnosis, and plan were formulated together and I agree with the documentation in the resident's note. If no better, would rec a punch bx.

## 2017-12-05 DIAGNOSIS — N2581 Secondary hyperparathyroidism of renal origin: Secondary | ICD-10-CM | POA: Diagnosis not present

## 2017-12-05 DIAGNOSIS — D509 Iron deficiency anemia, unspecified: Secondary | ICD-10-CM | POA: Diagnosis not present

## 2017-12-05 DIAGNOSIS — N186 End stage renal disease: Secondary | ICD-10-CM | POA: Diagnosis not present

## 2017-12-05 DIAGNOSIS — D631 Anemia in chronic kidney disease: Secondary | ICD-10-CM | POA: Diagnosis not present

## 2017-12-07 DIAGNOSIS — N186 End stage renal disease: Secondary | ICD-10-CM | POA: Diagnosis not present

## 2017-12-07 DIAGNOSIS — N2581 Secondary hyperparathyroidism of renal origin: Secondary | ICD-10-CM | POA: Diagnosis not present

## 2017-12-07 DIAGNOSIS — D631 Anemia in chronic kidney disease: Secondary | ICD-10-CM | POA: Diagnosis not present

## 2017-12-07 DIAGNOSIS — D509 Iron deficiency anemia, unspecified: Secondary | ICD-10-CM | POA: Diagnosis not present

## 2017-12-10 DIAGNOSIS — D631 Anemia in chronic kidney disease: Secondary | ICD-10-CM | POA: Diagnosis not present

## 2017-12-10 DIAGNOSIS — D509 Iron deficiency anemia, unspecified: Secondary | ICD-10-CM | POA: Diagnosis not present

## 2017-12-10 DIAGNOSIS — N2581 Secondary hyperparathyroidism of renal origin: Secondary | ICD-10-CM | POA: Diagnosis not present

## 2017-12-10 DIAGNOSIS — N186 End stage renal disease: Secondary | ICD-10-CM | POA: Diagnosis not present

## 2017-12-11 ENCOUNTER — Ambulatory Visit (HOSPITAL_COMMUNITY)
Admission: RE | Admit: 2017-12-11 | Discharge: 2017-12-11 | Disposition: A | Discharge status: Home / Self Care | Payer: Medicare Other | Source: Ambulatory Visit | Attending: Internal Medicine | Admitting: Internal Medicine

## 2017-12-11 DIAGNOSIS — I5022 Chronic systolic (congestive) heart failure: Secondary | ICD-10-CM | POA: Diagnosis not present

## 2017-12-11 NOTE — Progress Notes (Signed)
  Echocardiogram 2D Echocardiogram has been performed.  Elmer Ramp 12/11/2017, 4:33 PM

## 2017-12-12 DIAGNOSIS — N2581 Secondary hyperparathyroidism of renal origin: Secondary | ICD-10-CM | POA: Diagnosis not present

## 2017-12-12 DIAGNOSIS — D631 Anemia in chronic kidney disease: Secondary | ICD-10-CM | POA: Diagnosis not present

## 2017-12-12 DIAGNOSIS — N186 End stage renal disease: Secondary | ICD-10-CM | POA: Diagnosis not present

## 2017-12-12 DIAGNOSIS — D509 Iron deficiency anemia, unspecified: Secondary | ICD-10-CM | POA: Diagnosis not present

## 2017-12-14 ENCOUNTER — Emergency Department (HOSPITAL_COMMUNITY): Payer: Medicare Other

## 2017-12-14 ENCOUNTER — Encounter (HOSPITAL_COMMUNITY): Payer: Self-pay | Admitting: Emergency Medicine

## 2017-12-14 ENCOUNTER — Emergency Department (HOSPITAL_COMMUNITY)
Admission: EM | Admit: 2017-12-14 | Discharge: 2017-12-14 | Disposition: A | Discharge status: Home / Self Care | Payer: Medicare Other | Attending: Emergency Medicine | Admitting: Emergency Medicine

## 2017-12-14 DIAGNOSIS — J449 Chronic obstructive pulmonary disease, unspecified: Secondary | ICD-10-CM | POA: Diagnosis not present

## 2017-12-14 DIAGNOSIS — Y92019 Unspecified place in single-family (private) house as the place of occurrence of the external cause: Secondary | ICD-10-CM | POA: Diagnosis not present

## 2017-12-14 DIAGNOSIS — Z79899 Other long term (current) drug therapy: Secondary | ICD-10-CM | POA: Diagnosis not present

## 2017-12-14 DIAGNOSIS — Y998 Other external cause status: Secondary | ICD-10-CM | POA: Diagnosis not present

## 2017-12-14 DIAGNOSIS — F1210 Cannabis abuse, uncomplicated: Secondary | ICD-10-CM | POA: Insufficient documentation

## 2017-12-14 DIAGNOSIS — Z992 Dependence on renal dialysis: Secondary | ICD-10-CM | POA: Insufficient documentation

## 2017-12-14 DIAGNOSIS — Y9301 Activity, walking, marching and hiking: Secondary | ICD-10-CM | POA: Insufficient documentation

## 2017-12-14 DIAGNOSIS — I5022 Chronic systolic (congestive) heart failure: Secondary | ICD-10-CM | POA: Diagnosis not present

## 2017-12-14 DIAGNOSIS — S60212A Contusion of left wrist, initial encounter: Secondary | ICD-10-CM | POA: Diagnosis not present

## 2017-12-14 DIAGNOSIS — I132 Hypertensive heart and chronic kidney disease with heart failure and with stage 5 chronic kidney disease, or end stage renal disease: Secondary | ICD-10-CM | POA: Insufficient documentation

## 2017-12-14 DIAGNOSIS — W2203XA Walked into furniture, initial encounter: Secondary | ICD-10-CM | POA: Diagnosis not present

## 2017-12-14 DIAGNOSIS — B20 Human immunodeficiency virus [HIV] disease: Secondary | ICD-10-CM | POA: Diagnosis not present

## 2017-12-14 DIAGNOSIS — F17210 Nicotine dependence, cigarettes, uncomplicated: Secondary | ICD-10-CM | POA: Insufficient documentation

## 2017-12-14 DIAGNOSIS — S6992XA Unspecified injury of left wrist, hand and finger(s), initial encounter: Secondary | ICD-10-CM | POA: Diagnosis not present

## 2017-12-14 DIAGNOSIS — Z7901 Long term (current) use of anticoagulants: Secondary | ICD-10-CM | POA: Insufficient documentation

## 2017-12-14 DIAGNOSIS — N186 End stage renal disease: Secondary | ICD-10-CM | POA: Diagnosis not present

## 2017-12-14 DIAGNOSIS — M25532 Pain in left wrist: Secondary | ICD-10-CM | POA: Diagnosis not present
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
## 2017-12-14 NOTE — ED Triage Notes (Signed)
Patient states he sleep walks. Tonight he fell a few times. In one instance he fell and struck his left wrist at the location where he had a new dialysis fistula placed 2.5 months ago. Patient concerned he may have damaged the site. Area appears bruised. Bruit noted at site on palpation. Patient unable to articulate wrist well.

## 2017-12-14 NOTE — Discharge Instructions (Addendum)
You likely have a mild contusion of the wrist which is why it's hurting. Use an ace wrap to protect the area for the next 24-48 hours. You may consider using ice and elevating the area to help with pain or swelling. Use home pain medications as needed for pain. Follow up with your vascular surgeon in 5-7 days for recheck of symptoms and ongoing management. Return to the ER for emergent changes or worsening symptoms.

## 2017-12-15 DIAGNOSIS — I120 Hypertensive chronic kidney disease with stage 5 chronic kidney disease or end stage renal disease: Secondary | ICD-10-CM | POA: Diagnosis not present

## 2017-12-15 DIAGNOSIS — N186 End stage renal disease: Secondary | ICD-10-CM | POA: Diagnosis not present

## 2017-12-15 DIAGNOSIS — Z992 Dependence on renal dialysis: Secondary | ICD-10-CM | POA: Diagnosis not present

## 2017-12-17 DIAGNOSIS — N186 End stage renal disease: Secondary | ICD-10-CM | POA: Diagnosis not present

## 2017-12-17 DIAGNOSIS — D631 Anemia in chronic kidney disease: Secondary | ICD-10-CM | POA: Diagnosis not present

## 2017-12-17 DIAGNOSIS — N2581 Secondary hyperparathyroidism of renal origin: Secondary | ICD-10-CM | POA: Diagnosis not present

## 2017-12-17 DIAGNOSIS — D509 Iron deficiency anemia, unspecified: Secondary | ICD-10-CM | POA: Diagnosis not present

## 2017-12-19 DIAGNOSIS — D631 Anemia in chronic kidney disease: Secondary | ICD-10-CM | POA: Diagnosis not present

## 2017-12-19 DIAGNOSIS — N186 End stage renal disease: Secondary | ICD-10-CM | POA: Diagnosis not present

## 2017-12-19 DIAGNOSIS — N2581 Secondary hyperparathyroidism of renal origin: Secondary | ICD-10-CM | POA: Diagnosis not present

## 2017-12-19 DIAGNOSIS — D509 Iron deficiency anemia, unspecified: Secondary | ICD-10-CM | POA: Diagnosis not present

## 2017-12-22 DIAGNOSIS — D631 Anemia in chronic kidney disease: Secondary | ICD-10-CM | POA: Diagnosis not present

## 2017-12-22 DIAGNOSIS — N186 End stage renal disease: Secondary | ICD-10-CM | POA: Diagnosis not present

## 2017-12-22 DIAGNOSIS — D509 Iron deficiency anemia, unspecified: Secondary | ICD-10-CM | POA: Diagnosis not present

## 2017-12-22 DIAGNOSIS — N2581 Secondary hyperparathyroidism of renal origin: Secondary | ICD-10-CM | POA: Diagnosis not present

## 2017-12-23 ENCOUNTER — Ambulatory Visit (INDEPENDENT_AMBULATORY_CARE_PROVIDER_SITE_OTHER): Payer: Medicare Other | Admitting: Vascular Surgery

## 2017-12-23 ENCOUNTER — Encounter: Payer: Self-pay | Admitting: Vascular Surgery

## 2017-12-23 VITALS — BP 130/88 | HR 102 | Temp 94.0°F | Resp 16 | Ht 74.0 in | Wt 209.0 lb

## 2017-12-23 DIAGNOSIS — N186 End stage renal disease: Secondary | ICD-10-CM | POA: Diagnosis not present
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
## 2017-12-24 DIAGNOSIS — D631 Anemia in chronic kidney disease: Secondary | ICD-10-CM | POA: Diagnosis not present

## 2017-12-24 DIAGNOSIS — N186 End stage renal disease: Secondary | ICD-10-CM | POA: Diagnosis not present

## 2017-12-24 DIAGNOSIS — N2581 Secondary hyperparathyroidism of renal origin: Secondary | ICD-10-CM | POA: Diagnosis not present

## 2017-12-24 DIAGNOSIS — D509 Iron deficiency anemia, unspecified: Secondary | ICD-10-CM | POA: Diagnosis not present

## 2017-12-26 DIAGNOSIS — D631 Anemia in chronic kidney disease: Secondary | ICD-10-CM | POA: Diagnosis not present

## 2017-12-26 DIAGNOSIS — N2581 Secondary hyperparathyroidism of renal origin: Secondary | ICD-10-CM | POA: Diagnosis not present

## 2017-12-26 DIAGNOSIS — N186 End stage renal disease: Secondary | ICD-10-CM | POA: Diagnosis not present

## 2017-12-26 DIAGNOSIS — D509 Iron deficiency anemia, unspecified: Secondary | ICD-10-CM | POA: Diagnosis not present

## 2017-12-29 ENCOUNTER — Other Ambulatory Visit: Payer: Self-pay | Admitting: *Deleted

## 2017-12-29 DIAGNOSIS — N186 End stage renal disease: Secondary | ICD-10-CM | POA: Diagnosis not present

## 2017-12-29 DIAGNOSIS — D631 Anemia in chronic kidney disease: Secondary | ICD-10-CM | POA: Diagnosis not present

## 2017-12-29 DIAGNOSIS — D509 Iron deficiency anemia, unspecified: Secondary | ICD-10-CM | POA: Diagnosis not present

## 2017-12-29 DIAGNOSIS — N2581 Secondary hyperparathyroidism of renal origin: Secondary | ICD-10-CM | POA: Diagnosis not present

## 2017-12-31 DIAGNOSIS — D509 Iron deficiency anemia, unspecified: Secondary | ICD-10-CM | POA: Diagnosis not present

## 2017-12-31 DIAGNOSIS — N186 End stage renal disease: Secondary | ICD-10-CM | POA: Diagnosis not present

## 2017-12-31 DIAGNOSIS — N2581 Secondary hyperparathyroidism of renal origin: Secondary | ICD-10-CM | POA: Diagnosis not present

## 2017-12-31 DIAGNOSIS — D631 Anemia in chronic kidney disease: Secondary | ICD-10-CM | POA: Diagnosis not present

## 2018-01-02 DIAGNOSIS — N186 End stage renal disease: Secondary | ICD-10-CM | POA: Diagnosis not present

## 2018-01-02 DIAGNOSIS — D509 Iron deficiency anemia, unspecified: Secondary | ICD-10-CM | POA: Diagnosis not present

## 2018-01-02 DIAGNOSIS — N2581 Secondary hyperparathyroidism of renal origin: Secondary | ICD-10-CM | POA: Diagnosis not present

## 2018-01-02 DIAGNOSIS — D631 Anemia in chronic kidney disease: Secondary | ICD-10-CM | POA: Diagnosis not present

## 2018-01-07 DIAGNOSIS — N2581 Secondary hyperparathyroidism of renal origin: Secondary | ICD-10-CM | POA: Diagnosis not present

## 2018-01-07 DIAGNOSIS — D631 Anemia in chronic kidney disease: Secondary | ICD-10-CM | POA: Diagnosis not present

## 2018-01-07 DIAGNOSIS — D509 Iron deficiency anemia, unspecified: Secondary | ICD-10-CM | POA: Diagnosis not present

## 2018-01-07 DIAGNOSIS — N186 End stage renal disease: Secondary | ICD-10-CM | POA: Diagnosis not present

## 2018-01-09 DIAGNOSIS — N2581 Secondary hyperparathyroidism of renal origin: Secondary | ICD-10-CM | POA: Diagnosis not present

## 2018-01-09 DIAGNOSIS — D631 Anemia in chronic kidney disease: Secondary | ICD-10-CM | POA: Diagnosis not present

## 2018-01-09 DIAGNOSIS — D509 Iron deficiency anemia, unspecified: Secondary | ICD-10-CM | POA: Diagnosis not present

## 2018-01-09 DIAGNOSIS — N186 End stage renal disease: Secondary | ICD-10-CM | POA: Diagnosis not present

## 2018-01-10 ENCOUNTER — Other Ambulatory Visit: Payer: Self-pay | Admitting: Internal Medicine

## 2018-01-11 ENCOUNTER — Other Ambulatory Visit: Payer: Self-pay | Admitting: Internal Medicine

## 2018-01-11 DIAGNOSIS — B20 Human immunodeficiency virus [HIV] disease: Secondary | ICD-10-CM

## 2018-01-12 DIAGNOSIS — D631 Anemia in chronic kidney disease: Secondary | ICD-10-CM | POA: Diagnosis not present

## 2018-01-12 DIAGNOSIS — N186 End stage renal disease: Secondary | ICD-10-CM | POA: Diagnosis not present

## 2018-01-12 DIAGNOSIS — N2581 Secondary hyperparathyroidism of renal origin: Secondary | ICD-10-CM | POA: Diagnosis not present

## 2018-01-12 DIAGNOSIS — D509 Iron deficiency anemia, unspecified: Secondary | ICD-10-CM | POA: Diagnosis not present

## 2018-01-13 ENCOUNTER — Ambulatory Visit (INDEPENDENT_AMBULATORY_CARE_PROVIDER_SITE_OTHER): Payer: Medicare Other | Admitting: Pharmacist

## 2018-01-13 DIAGNOSIS — Z86718 Personal history of other venous thrombosis and embolism: Secondary | ICD-10-CM | POA: Diagnosis not present

## 2018-01-13 DIAGNOSIS — Z86711 Personal history of pulmonary embolism: Secondary | ICD-10-CM | POA: Diagnosis not present

## 2018-01-13 DIAGNOSIS — Z7901 Long term (current) use of anticoagulants: Secondary | ICD-10-CM | POA: Diagnosis not present

## 2018-01-13 DIAGNOSIS — Z5181 Encounter for therapeutic drug level monitoring: Secondary | ICD-10-CM | POA: Diagnosis not present

## 2018-01-13 LAB — POCT INR: INR: 2.4

## 2018-01-13 MED ORDER — WARFARIN SODIUM 5 MG PO TABS: ORAL_TABLET | ORAL | 3 refills | Status: DC

## 2018-01-13 MED ORDER — WARFARIN SODIUM 5 MG PO TABS: 5.0000 mg | ORAL_TABLET | ORAL | 3 refills | Status: DC
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
## 2018-01-13 NOTE — Patient Instructions (Signed)
Patient educated about medication as defined in this encounter and verbalized understanding by repeating back instructions provided.   

## 2018-01-14 DIAGNOSIS — D509 Iron deficiency anemia, unspecified: Secondary | ICD-10-CM | POA: Diagnosis not present

## 2018-01-14 DIAGNOSIS — D631 Anemia in chronic kidney disease: Secondary | ICD-10-CM | POA: Diagnosis not present

## 2018-01-14 DIAGNOSIS — N186 End stage renal disease: Secondary | ICD-10-CM | POA: Diagnosis not present

## 2018-01-14 DIAGNOSIS — N2581 Secondary hyperparathyroidism of renal origin: Secondary | ICD-10-CM | POA: Diagnosis not present

## 2018-01-14 MED ORDER — WARFARIN SODIUM 5 MG PO TABS: ORAL_TABLET | ORAL | 3 refills | Status: DC

## 2018-01-14 NOTE — Addendum Note (Signed)
Addended by: Forde Dandy on: 01/14/2018 08:42 AM   Modules accepted: Orders

## 2018-01-15 DIAGNOSIS — Z992 Dependence on renal dialysis: Secondary | ICD-10-CM | POA: Diagnosis not present

## 2018-01-15 DIAGNOSIS — N186 End stage renal disease: Secondary | ICD-10-CM | POA: Diagnosis not present

## 2018-01-15 DIAGNOSIS — I120 Hypertensive chronic kidney disease with stage 5 chronic kidney disease or end stage renal disease: Secondary | ICD-10-CM | POA: Diagnosis not present

## 2018-01-16 DIAGNOSIS — N2581 Secondary hyperparathyroidism of renal origin: Secondary | ICD-10-CM | POA: Diagnosis not present

## 2018-01-16 DIAGNOSIS — D509 Iron deficiency anemia, unspecified: Secondary | ICD-10-CM | POA: Diagnosis not present

## 2018-01-16 DIAGNOSIS — I120 Hypertensive chronic kidney disease with stage 5 chronic kidney disease or end stage renal disease: Secondary | ICD-10-CM | POA: Diagnosis not present

## 2018-01-16 DIAGNOSIS — Z992 Dependence on renal dialysis: Secondary | ICD-10-CM | POA: Diagnosis not present

## 2018-01-16 DIAGNOSIS — N186 End stage renal disease: Secondary | ICD-10-CM | POA: Diagnosis not present

## 2018-01-19 ENCOUNTER — Telehealth: Payer: Self-pay | Admitting: *Deleted

## 2018-01-19 DIAGNOSIS — D509 Iron deficiency anemia, unspecified: Secondary | ICD-10-CM | POA: Diagnosis not present

## 2018-01-19 DIAGNOSIS — N2581 Secondary hyperparathyroidism of renal origin: Secondary | ICD-10-CM | POA: Diagnosis not present

## 2018-01-19 DIAGNOSIS — N186 End stage renal disease: Secondary | ICD-10-CM | POA: Diagnosis not present

## 2018-01-19 NOTE — Telephone Encounter (Addendum)
PA information was sent to CoverMyMeds for Dronabinol 5mg  capsules.  Faxed . Awaiting determination within 72 hours.  Sander Nephew, RN 01/19/2018 4:44 PM. Dronabinol has been approved 12/14/2017 thru 12/14/2018.  Sander Nephew, RN 01/20/2018 1:11 PM.

## 2018-01-21 DIAGNOSIS — N186 End stage renal disease: Secondary | ICD-10-CM | POA: Diagnosis not present

## 2018-01-21 DIAGNOSIS — D509 Iron deficiency anemia, unspecified: Secondary | ICD-10-CM | POA: Diagnosis not present

## 2018-01-21 DIAGNOSIS — N2581 Secondary hyperparathyroidism of renal origin: Secondary | ICD-10-CM | POA: Diagnosis not present

## 2018-01-23 ENCOUNTER — Other Ambulatory Visit: Payer: Self-pay | Admitting: Internal Medicine

## 2018-01-23 DIAGNOSIS — N186 End stage renal disease: Secondary | ICD-10-CM | POA: Diagnosis not present

## 2018-01-23 DIAGNOSIS — D509 Iron deficiency anemia, unspecified: Secondary | ICD-10-CM | POA: Diagnosis not present

## 2018-01-23 DIAGNOSIS — Z9109 Other allergy status, other than to drugs and biological substances: Secondary | ICD-10-CM

## 2018-01-23 DIAGNOSIS — N2581 Secondary hyperparathyroidism of renal origin: Secondary | ICD-10-CM | POA: Diagnosis not present

## 2018-01-26 DIAGNOSIS — N2581 Secondary hyperparathyroidism of renal origin: Secondary | ICD-10-CM | POA: Diagnosis not present

## 2018-01-26 DIAGNOSIS — N186 End stage renal disease: Secondary | ICD-10-CM | POA: Diagnosis not present

## 2018-01-26 DIAGNOSIS — D509 Iron deficiency anemia, unspecified: Secondary | ICD-10-CM | POA: Diagnosis not present

## 2018-01-28 DIAGNOSIS — N2581 Secondary hyperparathyroidism of renal origin: Secondary | ICD-10-CM | POA: Diagnosis not present

## 2018-01-28 DIAGNOSIS — N186 End stage renal disease: Secondary | ICD-10-CM | POA: Diagnosis not present

## 2018-01-28 DIAGNOSIS — D509 Iron deficiency anemia, unspecified: Secondary | ICD-10-CM | POA: Diagnosis not present

## 2018-01-30 DIAGNOSIS — D509 Iron deficiency anemia, unspecified: Secondary | ICD-10-CM | POA: Diagnosis not present

## 2018-01-30 DIAGNOSIS — N2581 Secondary hyperparathyroidism of renal origin: Secondary | ICD-10-CM | POA: Diagnosis not present

## 2018-01-30 DIAGNOSIS — N186 End stage renal disease: Secondary | ICD-10-CM | POA: Diagnosis not present

## 2018-02-02 DIAGNOSIS — D509 Iron deficiency anemia, unspecified: Secondary | ICD-10-CM | POA: Diagnosis not present

## 2018-02-02 DIAGNOSIS — N2581 Secondary hyperparathyroidism of renal origin: Secondary | ICD-10-CM | POA: Diagnosis not present

## 2018-02-02 DIAGNOSIS — N186 End stage renal disease: Secondary | ICD-10-CM | POA: Diagnosis not present

## 2018-02-03 ENCOUNTER — Other Ambulatory Visit: Payer: Self-pay | Admitting: Internal Medicine

## 2018-02-03 DIAGNOSIS — B20 Human immunodeficiency virus [HIV] disease: Secondary | ICD-10-CM

## 2018-02-04 DIAGNOSIS — N186 End stage renal disease: Secondary | ICD-10-CM | POA: Diagnosis not present

## 2018-02-04 DIAGNOSIS — D509 Iron deficiency anemia, unspecified: Secondary | ICD-10-CM | POA: Diagnosis not present

## 2018-02-04 DIAGNOSIS — N2581 Secondary hyperparathyroidism of renal origin: Secondary | ICD-10-CM | POA: Diagnosis not present

## 2018-02-05 ENCOUNTER — Ambulatory Visit (HOSPITAL_COMMUNITY)
Admission: RE | Admit: 2018-02-05 | Discharge: 2018-02-05 | Disposition: A | Discharge status: Home / Self Care | Payer: Medicare Other | Source: Ambulatory Visit | Attending: Internal Medicine | Admitting: Internal Medicine

## 2018-02-05 ENCOUNTER — Other Ambulatory Visit: Payer: Self-pay

## 2018-02-05 ENCOUNTER — Ambulatory Visit (INDEPENDENT_AMBULATORY_CARE_PROVIDER_SITE_OTHER): Payer: Medicare Other | Admitting: Internal Medicine

## 2018-02-05 ENCOUNTER — Ambulatory Visit (INDEPENDENT_AMBULATORY_CARE_PROVIDER_SITE_OTHER): Payer: Medicare Other | Admitting: Pharmacist

## 2018-02-05 VITALS — BP 130/79 | HR 99 | Temp 98.0°F | Ht 74.0 in | Wt 207.0 lb

## 2018-02-05 DIAGNOSIS — Z992 Dependence on renal dialysis: Secondary | ICD-10-CM

## 2018-02-05 DIAGNOSIS — R109 Unspecified abdominal pain: Secondary | ICD-10-CM | POA: Diagnosis not present

## 2018-02-05 DIAGNOSIS — Z87828 Personal history of other (healed) physical injury and trauma: Secondary | ICD-10-CM | POA: Diagnosis not present

## 2018-02-05 DIAGNOSIS — S065X9A Traumatic subdural hemorrhage with loss of consciousness of unspecified duration, initial encounter: Secondary | ICD-10-CM | POA: Insufficient documentation

## 2018-02-05 DIAGNOSIS — G44319 Acute post-traumatic headache, not intractable: Secondary | ICD-10-CM

## 2018-02-05 DIAGNOSIS — Z5181 Encounter for therapeutic drug level monitoring: Secondary | ICD-10-CM

## 2018-02-05 DIAGNOSIS — Z79891 Long term (current) use of opiate analgesic: Secondary | ICD-10-CM

## 2018-02-05 DIAGNOSIS — G8929 Other chronic pain: Secondary | ICD-10-CM

## 2018-02-05 DIAGNOSIS — I10 Essential (primary) hypertension: Secondary | ICD-10-CM | POA: Diagnosis not present

## 2018-02-05 DIAGNOSIS — Z86711 Personal history of pulmonary embolism: Secondary | ICD-10-CM

## 2018-02-05 DIAGNOSIS — S065X9S Traumatic subdural hemorrhage with loss of consciousness of unspecified duration, sequela: Secondary | ICD-10-CM | POA: Diagnosis not present

## 2018-02-05 DIAGNOSIS — Z86718 Personal history of other venous thrombosis and embolism: Secondary | ICD-10-CM | POA: Diagnosis not present

## 2018-02-05 DIAGNOSIS — W19XXXS Unspecified fall, sequela: Secondary | ICD-10-CM | POA: Diagnosis not present

## 2018-02-05 DIAGNOSIS — N2889 Other specified disorders of kidney and ureter: Secondary | ICD-10-CM

## 2018-02-05 DIAGNOSIS — Z23 Encounter for immunization: Secondary | ICD-10-CM

## 2018-02-05 DIAGNOSIS — R296 Repeated falls: Secondary | ICD-10-CM | POA: Diagnosis not present

## 2018-02-05 DIAGNOSIS — F17210 Nicotine dependence, cigarettes, uncomplicated: Secondary | ICD-10-CM

## 2018-02-05 DIAGNOSIS — Z7901 Long term (current) use of anticoagulants: Secondary | ICD-10-CM

## 2018-02-05 DIAGNOSIS — R42 Dizziness and giddiness: Secondary | ICD-10-CM | POA: Diagnosis not present

## 2018-02-05 DIAGNOSIS — R519 Headache, unspecified: Secondary | ICD-10-CM | POA: Insufficient documentation

## 2018-02-05 DIAGNOSIS — I151 Hypertension secondary to other renal disorders: Secondary | ICD-10-CM

## 2018-02-05 DIAGNOSIS — R51 Headache: Secondary | ICD-10-CM

## 2018-02-05 LAB — POCT INR: INR: 2.2

## 2018-02-05 MED ORDER — DRONABINOL 5 MG PO CAPS: 5.0000 mg | ORAL_CAPSULE | Freq: Two times a day (BID) | ORAL | 0 refills | Status: DC

## 2018-02-05 MED ORDER — OXYCODONE-ACETAMINOPHEN 10-325 MG PO TABS: 1.0000 | ORAL_TABLET | Freq: Three times a day (TID) | ORAL | 0 refills | Status: DC | PRN

## 2018-02-05 NOTE — Assessment & Plan Note (Signed)
Blood pressure improved from prior visit . Amlodipine was started through dialysis.  - continue amlodipine  - appreciate nephrology help in management of this
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
## 2018-02-05 NOTE — Assessment & Plan Note (Signed)
[name] is on chronic opioid therapy for chronic pain. The date of the controlled substances contract is referenced in the [place] and / or the overview. Date of pain contract was August 13, 2017. As part of the treatment plan, the [place] controlled substance database is checked at least twice yearly and the database results are appropriate. I have last reviewed the results on 02/05/2018.   The last Blood toxicology was on your 20th 2018 and results are as expected. Patient needs at least a yearly UDS.   The patient is on oxycodone/acetaminophen (Percocet, Tylox) strength 10-325, 90 per 30 days and dronabinol 5 mg twice daily. Adjunctive treatment includes Muscle relaxants. This regimen allows KARL ERWAY to function and does not cause excessive sedation or other side effects.  "The benefits of continuing opioid therapy outweigh the risks and chronic opioids will be continued. 

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
## 2018-02-05 NOTE — Patient Instructions (Signed)
FOLLOW-UP INSTRUCTIONS When: 3 months For: blood pressure check and follow up of your pain  What to bring: all of your medication bottles

## 2018-02-05 NOTE — Addendum Note (Signed)
Addended by: Meryl Dare on: 02/05/2018 06:11 PM   Modules accepted: Orders

## 2018-02-05 NOTE — Assessment & Plan Note (Addendum)
Developed 3 months ago after he fell and hit his head and awoke the next morning with blood on the back of his head and not remembering the events from the night before.The headache was intermittent initially but then became constant 3 weeks ago when he fell and his his head again. The headache is over both of his temples and radiates around the sides to the back of his head. The headache was associated with vertigo, where he felt that when he moved his head his eyes would take time to adjust for a moment. The vertigo resolved yesterday but now he has lightheadedness when he changes position quickly. He develops dizziness with [place] testing today but nystagmus is not appreciated. He has no neuro deficits on exam today. Denies weakness or numbness weakness. No changes in vision. Does not awaken him from sleep. HIV was undetectable on last screen in august 2018 and CD4 count was 800 at that time. This post traumatic non intractable headache in the setting of chronic anticoagulant use is concerning for subdural hemorrhage.  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

## 2018-02-05 NOTE — Patient Instructions (Signed)
Patient educated about medication as defined in this encounter and verbalized understanding by repeating back instructions provided.   

## 2018-02-06 DIAGNOSIS — N186 End stage renal disease: Secondary | ICD-10-CM | POA: Diagnosis not present

## 2018-02-06 DIAGNOSIS — D509 Iron deficiency anemia, unspecified: Secondary | ICD-10-CM | POA: Diagnosis not present

## 2018-02-06 DIAGNOSIS — N2581 Secondary hyperparathyroidism of renal origin: Secondary | ICD-10-CM | POA: Diagnosis not present

## 2018-02-09 DIAGNOSIS — N186 End stage renal disease: Secondary | ICD-10-CM | POA: Diagnosis not present

## 2018-02-09 DIAGNOSIS — N2581 Secondary hyperparathyroidism of renal origin: Secondary | ICD-10-CM | POA: Diagnosis not present

## 2018-02-09 DIAGNOSIS — D509 Iron deficiency anemia, unspecified: Secondary | ICD-10-CM | POA: Diagnosis not present

## 2018-02-09 NOTE — Progress Notes (Signed)
Internal Medicine Clinic Attending  Case discussed with Dr. Blum at the time of the visit.  We reviewed the resident's history and exam and pertinent patient test results.  I agree with the assessment, diagnosis, and plan of care documented in the resident's note. 

## 2018-02-09 NOTE — Progress Notes (Signed)
INTERNAL MEDICINE TEACHING ATTENDING ADDENDUM - Aldine Contes M.D  Duration- indefinite, Indication- Unprovoked DVT, PE, INR- therapeutic. Agree with pharmacy recommendations as outlined in their note.

## 2018-02-11 DIAGNOSIS — N186 End stage renal disease: Secondary | ICD-10-CM | POA: Diagnosis not present

## 2018-02-11 DIAGNOSIS — D509 Iron deficiency anemia, unspecified: Secondary | ICD-10-CM | POA: Diagnosis not present

## 2018-02-11 DIAGNOSIS — N2581 Secondary hyperparathyroidism of renal origin: Secondary | ICD-10-CM | POA: Diagnosis not present

## 2018-02-13 DIAGNOSIS — I120 Hypertensive chronic kidney disease with stage 5 chronic kidney disease or end stage renal disease: Secondary | ICD-10-CM | POA: Diagnosis not present

## 2018-02-13 DIAGNOSIS — N186 End stage renal disease: Secondary | ICD-10-CM | POA: Diagnosis not present

## 2018-02-13 DIAGNOSIS — Z992 Dependence on renal dialysis: Secondary | ICD-10-CM | POA: Diagnosis not present

## 2018-02-13 DIAGNOSIS — N2581 Secondary hyperparathyroidism of renal origin: Secondary | ICD-10-CM | POA: Diagnosis not present

## 2018-02-16 DIAGNOSIS — N2581 Secondary hyperparathyroidism of renal origin: Secondary | ICD-10-CM | POA: Diagnosis not present

## 2018-02-16 DIAGNOSIS — N186 End stage renal disease: Secondary | ICD-10-CM | POA: Diagnosis not present

## 2018-02-17 LAB — DRUG SCREEN 10 W/CONF, WB
Amphetamines, IA: NEGATIVE ng/mL
Barbiturates, IA: NEGATIVE ug/mL
Benzodiazepines, IA: NEGATIVE ng/mL
Cocaine/Metabolite, IA: NEGATIVE ng/mL
METHADONE, IA: NEGATIVE ng/mL
OPIATES, IA: NEGATIVE ng/mL
OXYCODONES, IA: POSITIVE ng/mL — AB
Phencyclidine, IA: NEGATIVE ng/mL
Propoxyphene, IA: NEGATIVE ng/mL
THC (MARIJUANA) MTB,IA: POSITIVE ng/mL — AB

## 2018-02-17 LAB — OXYCODONES,MS,WB/SP RFX
OXYCOCONE: 7.1 ng/mL
OXYCODONES CONFIRMATION: POSITIVE
OXYMORPHONE: NEGATIVE ng/mL

## 2018-02-17 LAB — THC,MS,WB/SP RFX
Cannabidiol: NEGATIVE ng/mL
Cannabinoid Confirmation: POSITIVE
Cannabinol: NEGATIVE ng/mL
Carboxy-THC: 36.9 ng/mL
HYDROXY-THC: 1.5 ng/mL
Tetrahydrocannabinol(THC): 3.4 ng/mL

## 2018-02-18 DIAGNOSIS — N2581 Secondary hyperparathyroidism of renal origin: Secondary | ICD-10-CM | POA: Diagnosis not present

## 2018-02-18 DIAGNOSIS — N186 End stage renal disease: Secondary | ICD-10-CM | POA: Diagnosis not present

## 2018-02-20 DIAGNOSIS — N2581 Secondary hyperparathyroidism of renal origin: Secondary | ICD-10-CM | POA: Diagnosis not present

## 2018-02-20 DIAGNOSIS — N186 End stage renal disease: Secondary | ICD-10-CM | POA: Diagnosis not present

## 2018-02-21 ENCOUNTER — Other Ambulatory Visit: Payer: Self-pay | Admitting: Internal Medicine

## 2018-02-21 DIAGNOSIS — B20 Human immunodeficiency virus [HIV] disease: Secondary | ICD-10-CM

## 2018-02-23 ENCOUNTER — Telehealth: Payer: Self-pay | Admitting: *Deleted

## 2018-02-23 NOTE — Telephone Encounter (Signed)
Patient out of care, last saw Dr 05/2016. He followed up with Cassie in Pharmacy 07/2017 where she changed his medication. He was supposed to follow up in 1 month, but no showed. Medications have been denied since January 2019.  RN called patient, left message notifying him that he must be seen for refills. Landis Gandy, RN

## 2018-02-25 DIAGNOSIS — N2581 Secondary hyperparathyroidism of renal origin: Secondary | ICD-10-CM | POA: Diagnosis not present

## 2018-02-25 DIAGNOSIS — N186 End stage renal disease: Secondary | ICD-10-CM | POA: Diagnosis not present

## 2018-02-25 DIAGNOSIS — D509 Iron deficiency anemia, unspecified: Secondary | ICD-10-CM | POA: Diagnosis not present

## 2018-02-25 DIAGNOSIS — D631 Anemia in chronic kidney disease: Secondary | ICD-10-CM | POA: Diagnosis not present

## 2018-02-25 DIAGNOSIS — Z23 Encounter for immunization: Secondary | ICD-10-CM | POA: Diagnosis not present

## 2018-02-26 ENCOUNTER — Encounter: Payer: Self-pay | Admitting: Internal Medicine

## 2018-02-26 ENCOUNTER — Other Ambulatory Visit: Payer: Self-pay | Admitting: *Deleted

## 2018-02-26 ENCOUNTER — Other Ambulatory Visit: Payer: Self-pay

## 2018-02-26 ENCOUNTER — Ambulatory Visit (INDEPENDENT_AMBULATORY_CARE_PROVIDER_SITE_OTHER): Payer: Medicare Other | Admitting: Internal Medicine

## 2018-02-26 ENCOUNTER — Ambulatory Visit: Payer: Medicare Other | Admitting: Pharmacist

## 2018-02-26 ENCOUNTER — Telehealth: Payer: Self-pay | Admitting: *Deleted

## 2018-02-26 VITALS — BP 149/103 | HR 108 | Temp 97.8°F | Ht 74.0 in | Wt 203.0 lb

## 2018-02-26 DIAGNOSIS — Y9384 Activity, sleeping: Secondary | ICD-10-CM | POA: Diagnosis not present

## 2018-02-26 DIAGNOSIS — Z9181 History of falling: Secondary | ICD-10-CM

## 2018-02-26 DIAGNOSIS — Z560 Unemployment, unspecified: Secondary | ICD-10-CM | POA: Diagnosis not present

## 2018-02-26 DIAGNOSIS — J3081 Allergic rhinitis due to animal (cat) (dog) hair and dander: Secondary | ICD-10-CM

## 2018-02-26 DIAGNOSIS — F513 Sleepwalking [somnambulism]: Secondary | ICD-10-CM

## 2018-02-26 DIAGNOSIS — Z86718 Personal history of other venous thrombosis and embolism: Secondary | ICD-10-CM

## 2018-02-26 DIAGNOSIS — J342 Deviated nasal septum: Secondary | ICD-10-CM

## 2018-02-26 DIAGNOSIS — F17210 Nicotine dependence, cigarettes, uncomplicated: Secondary | ICD-10-CM

## 2018-02-26 DIAGNOSIS — W19XXXA Unspecified fall, initial encounter: Secondary | ICD-10-CM | POA: Insufficient documentation

## 2018-02-26 DIAGNOSIS — S0012XA Contusion of left eyelid and periocular area, initial encounter: Secondary | ICD-10-CM

## 2018-02-26 DIAGNOSIS — G4700 Insomnia, unspecified: Secondary | ICD-10-CM

## 2018-02-26 DIAGNOSIS — Z7901 Long term (current) use of anticoagulants: Secondary | ICD-10-CM

## 2018-02-26 DIAGNOSIS — Z836 Family history of other diseases of the respiratory system: Secondary | ICD-10-CM

## 2018-02-26 DIAGNOSIS — Z8249 Family history of ischemic heart disease and other diseases of the circulatory system: Secondary | ICD-10-CM

## 2018-02-26 DIAGNOSIS — Z992 Dependence on renal dialysis: Secondary | ICD-10-CM | POA: Diagnosis not present

## 2018-02-26 DIAGNOSIS — R296 Repeated falls: Secondary | ICD-10-CM

## 2018-02-26 DIAGNOSIS — Z86711 Personal history of pulmonary embolism: Secondary | ICD-10-CM | POA: Diagnosis not present

## 2018-02-26 DIAGNOSIS — G44309 Post-traumatic headache, unspecified, not intractable: Secondary | ICD-10-CM

## 2018-02-26 LAB — POCT INR: INR: 2.5

## 2018-02-26 NOTE — Assessment & Plan Note (Signed)
Patient has a long history of insomnia and sleep walking. He has been referred for a sleep study in the past due to concerns about OSA; as his girlfriend has told him that he stops breathing at times in his sleep. He did not have his previously ordered sleep study performed, but is interested in having one done at this time. We will order another sleep study today. - Sleep study

## 2018-02-26 NOTE — Assessment & Plan Note (Signed)
Patient had INR check today as part of warfarin monitoring. INR therapeutic at 2.5 - Continue warfarin dosing per warfarin clinic shedule
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
## 2018-02-26 NOTE — Patient Instructions (Addendum)
Thank you for allowing [place] to care for you  For your falls while sleep walking: - We have ordered a follow up CT for you - We have ordered a sleep study for you - Consider use of a soft helmet if falls continue - Talk to your nephrologist about confirming your dry weight as you became lightheaded during orthostatics  Follow up with PCP in 2 months.

## 2018-02-26 NOTE — Assessment & Plan Note (Addendum)
Patient presenting for follow up of headache he was seen for on 02/05/18 following a fall at home. While sleep walking His headache was previously bitemporal, constant, and severe. He had no neurologic deficits on exam that day but was sent for CT due to intractable headache in the setting of chronic anticoagulation. CT showed 2 chronic subdural hematomas, recommendations from neurosurgery and vasucalr surgery were for him to hold his wafarin for 2 weeks and obtain repeat CT. He held wafarin for 1 day and had clotting during dialysis and restarted it. He is hear for follow up 2 weeks after initial encounter.  At this time he states that his headaches have improved. He now only has intermittent headaches that are "normal" intensity and over all fell like "normal headaches". He did not have any further falls for about 3 weeks until 3 days ago when he again fell while sleep walking. He states that he fell asleep on the couch and awoke on the floor of his kitchen. He can not be certain what happened at the time of the fall or how much time elapsed between the fall and him waking up. He did not experience any urinary or fecal incontinence. He denies oral trauma or confusion upon waking. He did have a bloody nose and general muscle aches when he awoke. He states that he had a couple of episodes of confusion that day, but has not had any since. He also states he is feeling less sore.   Patient has bruise of left brown and deviated septum without bruising of his nose. He has no headache at this time and is feeling largely back to baseline. Orthostatic vital were performed and his BP measurement cannot be interpreted as he has bilateral AV fistulae and BP must be taken at his ankle. His orthostatics were positive for HR increase of 11 from laying to sitting and 15 from sitting to standing. Patient also reports some subjected light headedness while changing positions for this test.  Patients history suspicious for mild  concussion, but with symptoms resolved do not suspect intracranial hemorrhage. Do not suspect seizure as an etiology for his falls. Patient reported his last fall coming the night after undergoing dialysis and when asked he states that he believes his previous fall occurred the night after dialysis as well. With his positive orthostatics and the timing of the fall being near dialysis, he may be experiecing orthostasis while sleepwalking following dialysis due to inaccurate dry weight. He has been instructed to talk to his nephrologist about reevaluating his dry weight. - Repeat CT head as planned - Patient to talk to his nephrologist about his symptoms following his dialysis sessions and need for reevaluation of his dry weight. - Patient to under go sleep study for further evaluation of his sleep patterns including evaluation for OSA.

## 2018-02-26 NOTE — Progress Notes (Signed)
Co-visit 
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
## 2018-02-26 NOTE — Assessment & Plan Note (Signed)
3 days ago patient fell while sleep walking. He states that he fell asleep on the couch and awoke on the floor of his kitchen. He can not be certain what happened at the time of the fall or how much time elapsed between the fall and him waking up. He did not experience any urinary or fecal incontinence. He denies oral trauma or confusion upon waking. He did have a bloody nose and general muscle aches when he awoke. He states that he had a couple of episodes of confusion that day, but has not had any since. He also states he is feeling less sore.   Patient has bruise of left brown and deviated septum without bruising of his nose. He has no headache at this time and is feeling largely back to baseline. Orthostatic vital were performed and his BP measurement cannot be interpreted as he has bilateral AV fistulae and BP must be taken at his ankle. His orthostatics were positive for HR increase of 11 from laying to sitting and 15 from sitting to standing. Patient also reports some subjected light headedness while changing positions for this test.  Patients history suspicious for mild concussion, but with symptoms resolved do not suspect intracranial hemorrhage. Do not suspect seizure as an etiology for his falls. Patient reported his last fall coming the night after undergoing dialysis and when asked he states that he believes his previous fall occurred the night after dialysis as well. With his positive orthostatics and the timing of the fall being near dialysis, he may be experiecing orthostasis while sleepwalking following dialysis due to inaccurate dry weight. He has been instructed to talk to his nephrologist about reevaluating his dry weight. - Repeat CT head as planned (for follow up of previously noted hematoma) - Patient to talk to his nephrologist about his symptoms following his dialysis sessions and need for reevaluation of his dry weight. - Patient to under go sleep study for further evaluation of his  sleep patterns including evaluation for OSA.

## 2018-02-27 DIAGNOSIS — N186 End stage renal disease: Secondary | ICD-10-CM | POA: Diagnosis not present

## 2018-02-27 DIAGNOSIS — D509 Iron deficiency anemia, unspecified: Secondary | ICD-10-CM | POA: Diagnosis not present

## 2018-02-27 DIAGNOSIS — Z23 Encounter for immunization: Secondary | ICD-10-CM | POA: Diagnosis not present

## 2018-02-27 DIAGNOSIS — N2581 Secondary hyperparathyroidism of renal origin: Secondary | ICD-10-CM | POA: Diagnosis not present

## 2018-02-27 DIAGNOSIS — D631 Anemia in chronic kidney disease: Secondary | ICD-10-CM | POA: Diagnosis not present

## 2018-03-02 ENCOUNTER — Other Ambulatory Visit: Payer: Self-pay | Admitting: *Deleted

## 2018-03-02 MED ORDER — OXYCODONE-ACETAMINOPHEN 10-325 MG PO TABS: 1.0000 | ORAL_TABLET | Freq: Three times a day (TID) | ORAL | 0 refills | Status: DC | PRN

## 2018-03-02 NOTE — Telephone Encounter (Signed)
Feb note indicated printed off 3 scripts but EMR indicated only one. UDS abnl for THC - no comment. Will fill and request 3 month (May) F/U with PCP for controlled substance mgmt

## 2018-03-02 NOTE — Progress Notes (Signed)
Case discussed with Dr. Melvin at the time of the visit.  We reviewed the resident's history and exam and pertinent patient test results.  I agree with the assessment, diagnosis and plan of care documented in the resident's note. 

## 2018-03-02 NOTE — Addendum Note (Signed)
Addended by: Larey Dresser A on: 03/02/2018 03:18 PM   Modules accepted: Orders

## 2018-03-04 DIAGNOSIS — D509 Iron deficiency anemia, unspecified: Secondary | ICD-10-CM | POA: Diagnosis not present

## 2018-03-04 DIAGNOSIS — Z23 Encounter for immunization: Secondary | ICD-10-CM | POA: Diagnosis not present

## 2018-03-04 DIAGNOSIS — N186 End stage renal disease: Secondary | ICD-10-CM | POA: Diagnosis not present

## 2018-03-04 DIAGNOSIS — D631 Anemia in chronic kidney disease: Secondary | ICD-10-CM | POA: Diagnosis not present

## 2018-03-04 DIAGNOSIS — N2581 Secondary hyperparathyroidism of renal origin: Secondary | ICD-10-CM | POA: Diagnosis not present

## 2018-03-05 ENCOUNTER — Ambulatory Visit (HOSPITAL_COMMUNITY): Payer: Medicare Other | Attending: Internal Medicine

## 2018-03-06 DIAGNOSIS — N186 End stage renal disease: Secondary | ICD-10-CM | POA: Diagnosis not present

## 2018-03-06 DIAGNOSIS — N2581 Secondary hyperparathyroidism of renal origin: Secondary | ICD-10-CM | POA: Diagnosis not present

## 2018-03-06 DIAGNOSIS — D631 Anemia in chronic kidney disease: Secondary | ICD-10-CM | POA: Diagnosis not present

## 2018-03-06 DIAGNOSIS — Z23 Encounter for immunization: Secondary | ICD-10-CM | POA: Diagnosis not present

## 2018-03-06 DIAGNOSIS — D509 Iron deficiency anemia, unspecified: Secondary | ICD-10-CM | POA: Diagnosis not present

## 2018-03-07 ENCOUNTER — Emergency Department (HOSPITAL_COMMUNITY): Payer: Medicare Other

## 2018-03-07 ENCOUNTER — Encounter (HOSPITAL_COMMUNITY): Payer: Self-pay | Admitting: Emergency Medicine

## 2018-03-07 ENCOUNTER — Emergency Department (HOSPITAL_COMMUNITY)
Admission: EM | Admit: 2018-03-07 | Discharge: 2018-03-07 | Disposition: A | Discharge status: Home / Self Care | Payer: Medicare Other | Attending: Emergency Medicine | Admitting: Emergency Medicine

## 2018-03-07 DIAGNOSIS — Y939 Activity, unspecified: Secondary | ICD-10-CM | POA: Insufficient documentation

## 2018-03-07 DIAGNOSIS — I120 Hypertensive chronic kidney disease with stage 5 chronic kidney disease or end stage renal disease: Secondary | ICD-10-CM | POA: Diagnosis not present

## 2018-03-07 DIAGNOSIS — Y999 Unspecified external cause status: Secondary | ICD-10-CM | POA: Insufficient documentation

## 2018-03-07 DIAGNOSIS — Z79899 Other long term (current) drug therapy: Secondary | ICD-10-CM | POA: Insufficient documentation

## 2018-03-07 DIAGNOSIS — N186 End stage renal disease: Secondary | ICD-10-CM | POA: Insufficient documentation

## 2018-03-07 DIAGNOSIS — S065X9A Traumatic subdural hemorrhage with loss of consciousness of unspecified duration, initial encounter: Secondary | ICD-10-CM | POA: Diagnosis not present

## 2018-03-07 DIAGNOSIS — R112 Nausea with vomiting, unspecified: Secondary | ICD-10-CM | POA: Insufficient documentation

## 2018-03-07 DIAGNOSIS — R079 Chest pain, unspecified: Secondary | ICD-10-CM | POA: Diagnosis present

## 2018-03-07 DIAGNOSIS — J449 Chronic obstructive pulmonary disease, unspecified: Secondary | ICD-10-CM | POA: Diagnosis not present

## 2018-03-07 DIAGNOSIS — S2249XA Multiple fractures of ribs, unspecified side, initial encounter for closed fracture: Secondary | ICD-10-CM | POA: Insufficient documentation

## 2018-03-07 DIAGNOSIS — F17210 Nicotine dependence, cigarettes, uncomplicated: Secondary | ICD-10-CM | POA: Diagnosis not present

## 2018-03-07 DIAGNOSIS — X58XXXA Exposure to other specified factors, initial encounter: Secondary | ICD-10-CM | POA: Insufficient documentation

## 2018-03-07 DIAGNOSIS — R296 Repeated falls: Secondary | ICD-10-CM | POA: Diagnosis not present

## 2018-03-07 DIAGNOSIS — Y929 Unspecified place or not applicable: Secondary | ICD-10-CM | POA: Insufficient documentation

## 2018-03-07 DIAGNOSIS — R1010 Upper abdominal pain, unspecified: Secondary | ICD-10-CM | POA: Diagnosis not present

## 2018-03-07 DIAGNOSIS — Z992 Dependence on renal dialysis: Secondary | ICD-10-CM | POA: Diagnosis not present

## 2018-03-07 DIAGNOSIS — Z7901 Long term (current) use of anticoagulants: Secondary | ICD-10-CM | POA: Insufficient documentation

## 2018-03-07 DIAGNOSIS — S065XAA Traumatic subdural hemorrhage with loss of consciousness status unknown, initial encounter: Secondary | ICD-10-CM

## 2018-03-07 DIAGNOSIS — I6202 Nontraumatic subacute subdural hemorrhage: Secondary | ICD-10-CM | POA: Diagnosis not present

## 2018-03-07 DIAGNOSIS — R0602 Shortness of breath: Secondary | ICD-10-CM | POA: Diagnosis not present

## 2018-03-07 DIAGNOSIS — K5730 Diverticulosis of large intestine without perforation or abscess without bleeding: Secondary | ICD-10-CM | POA: Diagnosis not present

## 2018-03-07 DIAGNOSIS — S0990XA Unspecified injury of head, initial encounter: Secondary | ICD-10-CM | POA: Diagnosis not present

## 2018-03-07 DIAGNOSIS — R05 Cough: Secondary | ICD-10-CM | POA: Diagnosis not present

## 2018-03-07 DIAGNOSIS — S2241XA Multiple fractures of ribs, right side, initial encounter for closed fracture: Secondary | ICD-10-CM | POA: Diagnosis not present

## 2018-03-07 LAB — COMPREHENSIVE METABOLIC PANEL
ALBUMIN: 3.7 g/dL (ref 3.5–5.0)
ALK PHOS: 66 U/L (ref 38–126)
ALT: 17 U/L (ref 17–63)
AST: 27 U/L (ref 15–41)
Anion gap: 18 — ABNORMAL HIGH (ref 5–15)
BUN: 39 mg/dL — ABNORMAL HIGH (ref 6–20)
CALCIUM: 9.3 mg/dL (ref 8.9–10.3)
CO2: 22 mmol/L (ref 22–32)
CREATININE: 11.67 mg/dL — AB (ref 0.61–1.24)
Chloride: 91 mmol/L — ABNORMAL LOW (ref 101–111)
GFR calc non Af Amer: 5 mL/min — ABNORMAL LOW (ref >60)
GFR, EST AFRICAN AMERICAN: 5 mL/min — AB (ref >60)
GLUCOSE: 94 mg/dL (ref 65–99)
Potassium: 5.3 mmol/L — ABNORMAL HIGH (ref 3.5–5.1)
SODIUM: 131 mmol/L — AB (ref 135–145)
TOTAL PROTEIN: 7.9 g/dL (ref 6.5–8.1)
Total Bilirubin: 0.9 mg/dL (ref 0.3–1.2)

## 2018-03-07 LAB — LIPASE, BLOOD: Lipase: 30 U/L (ref 11–51)

## 2018-03-07 LAB — I-STAT TROPONIN, ED: TROPONIN I, POC: 0.28 ng/mL — AB (ref 0.00–0.08)

## 2018-03-07 LAB — CBC
HCT: 39.9 % (ref 39.0–52.0)
HEMOGLOBIN: 12.7 g/dL — AB (ref 13.0–17.0)
MCH: 32.7 pg (ref 26.0–34.0)
MCHC: 31.8 g/dL (ref 30.0–36.0)
MCV: 102.8 fL — ABNORMAL HIGH (ref 78.0–100.0)
Platelets: 142 10*3/uL — ABNORMAL LOW (ref 150–400)
RBC: 3.88 MIL/uL — ABNORMAL LOW (ref 4.22–5.81)
RDW: 15.4 % (ref 11.5–15.5)
WBC: 4.4 10*3/uL (ref 4.0–10.5)

## 2018-03-07 LAB — PROTIME-INR
INR: 2.6
Prothrombin Time: 27.6 seconds — ABNORMAL HIGH (ref 11.4–15.2)

## 2018-03-07 MED ORDER — DOXYCYCLINE HYCLATE 100 MG PO CAPS: 100.0000 mg | ORAL_CAPSULE | Freq: Two times a day (BID) | ORAL | 0 refills | Status: DC

## 2018-03-07 MED ORDER — PROMETHAZINE HCL 25 MG PO TABS
25.0000 mg | ORAL_TABLET | ORAL | Status: AC
  Administered 2018-03-07: 25 mg via ORAL
  Filled 2018-03-07: qty 1

## 2018-03-07 MED ORDER — ONDANSETRON 4 MG PO TBDP: 4.0000 mg | ORAL_TABLET | Freq: Three times a day (TID) | ORAL | 0 refills | Status: DC | PRN

## 2018-03-07 MED ORDER — NICOTINE 21 MG/24HR TD PT24
21.0000 mg | MEDICATED_PATCH | Freq: Once | TRANSDERMAL | Status: DC
  Administered 2018-03-07: 21 mg via TRANSDERMAL
  Filled 2018-03-07: qty 1

## 2018-03-07 MED ORDER — ONDANSETRON 4 MG PO TBDP
8.0000 mg | ORAL_TABLET | Freq: Once | ORAL | Status: AC
  Administered 2018-03-07: 8 mg via ORAL
  Filled 2018-03-07: qty 2
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
## 2018-03-07 NOTE — ED Triage Notes (Addendum)
Pt reports 6 days of nausea and abdominal pain with eating, also right sided chest pain. Pt is dialysis pt. Uses fistula to left arm. Pt also states fever chills. Unable to draw blood at triage from either arm and MD Ralene Bathe states she cannot give a verbal for a foot stick order without assessing the patient first.

## 2018-03-09 DIAGNOSIS — N2581 Secondary hyperparathyroidism of renal origin: Secondary | ICD-10-CM | POA: Diagnosis not present

## 2018-03-09 DIAGNOSIS — D631 Anemia in chronic kidney disease: Secondary | ICD-10-CM | POA: Diagnosis not present

## 2018-03-09 DIAGNOSIS — Z23 Encounter for immunization: Secondary | ICD-10-CM | POA: Diagnosis not present

## 2018-03-09 DIAGNOSIS — D509 Iron deficiency anemia, unspecified: Secondary | ICD-10-CM | POA: Diagnosis not present

## 2018-03-09 DIAGNOSIS — N186 End stage renal disease: Secondary | ICD-10-CM | POA: Diagnosis not present

## 2018-03-10 ENCOUNTER — Encounter (HOSPITAL_COMMUNITY): Payer: Self-pay | Admitting: General Practice

## 2018-03-10 ENCOUNTER — Other Ambulatory Visit: Payer: Self-pay

## 2018-03-10 ENCOUNTER — Observation Stay (HOSPITAL_COMMUNITY)
Admission: RE | Admit: 2018-03-10 | Discharge: 2018-03-11 | Disposition: A | Discharge status: Home / Self Care | Payer: Medicare Other | Source: Ambulatory Visit | Attending: Internal Medicine | Admitting: Internal Medicine

## 2018-03-10 ENCOUNTER — Other Ambulatory Visit: Payer: Medicare Other

## 2018-03-10 ENCOUNTER — Observation Stay (HOSPITAL_COMMUNITY): Payer: Medicare Other

## 2018-03-10 ENCOUNTER — Other Ambulatory Visit: Payer: Self-pay | Admitting: Behavioral Health

## 2018-03-10 ENCOUNTER — Ambulatory Visit (INDEPENDENT_AMBULATORY_CARE_PROVIDER_SITE_OTHER): Payer: Medicare Other | Admitting: Internal Medicine

## 2018-03-10 ENCOUNTER — Encounter: Payer: Self-pay | Admitting: Internal Medicine

## 2018-03-10 ENCOUNTER — Ambulatory Visit (INDEPENDENT_AMBULATORY_CARE_PROVIDER_SITE_OTHER): Payer: Medicare Other | Admitting: Pharmacist

## 2018-03-10 VITALS — BP 155/84 | HR 95 | Temp 98.1°F | Wt 200.9 lb

## 2018-03-10 DIAGNOSIS — S065XAA Traumatic subdural hemorrhage with loss of consciousness status unknown, initial encounter: Secondary | ICD-10-CM | POA: Insufficient documentation

## 2018-03-10 DIAGNOSIS — J449 Chronic obstructive pulmonary disease, unspecified: Secondary | ICD-10-CM | POA: Insufficient documentation

## 2018-03-10 DIAGNOSIS — G629 Polyneuropathy, unspecified: Secondary | ICD-10-CM | POA: Insufficient documentation

## 2018-03-10 DIAGNOSIS — Z7901 Long term (current) use of anticoagulants: Secondary | ICD-10-CM | POA: Insufficient documentation

## 2018-03-10 DIAGNOSIS — I120 Hypertensive chronic kidney disease with stage 5 chronic kidney disease or end stage renal disease: Secondary | ICD-10-CM

## 2018-03-10 DIAGNOSIS — G2581 Restless legs syndrome: Secondary | ICD-10-CM | POA: Diagnosis not present

## 2018-03-10 DIAGNOSIS — N186 End stage renal disease: Secondary | ICD-10-CM | POA: Diagnosis not present

## 2018-03-10 DIAGNOSIS — F17210 Nicotine dependence, cigarettes, uncomplicated: Secondary | ICD-10-CM | POA: Diagnosis not present

## 2018-03-10 DIAGNOSIS — S065X9A Traumatic subdural hemorrhage with loss of consciousness of unspecified duration, initial encounter: Secondary | ICD-10-CM

## 2018-03-10 DIAGNOSIS — I82509 Chronic embolism and thrombosis of unspecified deep veins of unspecified lower extremity: Secondary | ICD-10-CM

## 2018-03-10 DIAGNOSIS — Z86718 Personal history of other venous thrombosis and embolism: Secondary | ICD-10-CM | POA: Diagnosis not present

## 2018-03-10 DIAGNOSIS — Z79899 Other long term (current) drug therapy: Secondary | ICD-10-CM

## 2018-03-10 DIAGNOSIS — Z992 Dependence on renal dialysis: Secondary | ICD-10-CM | POA: Diagnosis not present

## 2018-03-10 DIAGNOSIS — Z791 Long term (current) use of non-steroidal anti-inflammatories (NSAID): Secondary | ICD-10-CM | POA: Insufficient documentation

## 2018-03-10 DIAGNOSIS — G609 Hereditary and idiopathic neuropathy, unspecified: Secondary | ICD-10-CM

## 2018-03-10 DIAGNOSIS — Z21 Asymptomatic human immunodeficiency virus [HIV] infection status: Secondary | ICD-10-CM | POA: Diagnosis not present

## 2018-03-10 DIAGNOSIS — R51 Headache: Secondary | ICD-10-CM | POA: Insufficient documentation

## 2018-03-10 DIAGNOSIS — Z113 Encounter for screening for infections with a predominantly sexual mode of transmission: Secondary | ICD-10-CM

## 2018-03-10 DIAGNOSIS — I151 Hypertension secondary to other renal disorders: Secondary | ICD-10-CM

## 2018-03-10 DIAGNOSIS — Z86711 Personal history of pulmonary embolism: Secondary | ICD-10-CM | POA: Diagnosis not present

## 2018-03-10 DIAGNOSIS — W19XXXD Unspecified fall, subsequent encounter: Secondary | ICD-10-CM | POA: Insufficient documentation

## 2018-03-10 DIAGNOSIS — B20 Human immunodeficiency virus [HIV] disease: Secondary | ICD-10-CM | POA: Diagnosis present

## 2018-03-10 DIAGNOSIS — S065X0A Traumatic subdural hemorrhage without loss of consciousness, initial encounter: Secondary | ICD-10-CM | POA: Diagnosis present

## 2018-03-10 DIAGNOSIS — I6200 Nontraumatic subdural hemorrhage, unspecified: Secondary | ICD-10-CM

## 2018-03-10 DIAGNOSIS — N2889 Other specified disorders of kidney and ureter: Secondary | ICD-10-CM

## 2018-03-10 DIAGNOSIS — I2782 Chronic pulmonary embolism: Secondary | ICD-10-CM

## 2018-03-10 DIAGNOSIS — S065X9D Traumatic subdural hemorrhage with loss of consciousness of unspecified duration, subsequent encounter: Principal | ICD-10-CM | POA: Insufficient documentation

## 2018-03-10 DIAGNOSIS — R0789 Other chest pain: Secondary | ICD-10-CM | POA: Diagnosis not present

## 2018-03-10 DIAGNOSIS — R112 Nausea with vomiting, unspecified: Secondary | ICD-10-CM | POA: Insufficient documentation

## 2018-03-10 DIAGNOSIS — I10 Essential (primary) hypertension: Secondary | ICD-10-CM | POA: Diagnosis present

## 2018-03-10 DIAGNOSIS — G8929 Other chronic pain: Secondary | ICD-10-CM | POA: Diagnosis not present

## 2018-03-10 HISTORY — DX: Traumatic subdural hemorrhage with loss of consciousness status unknown, initial encounter: S06.5XAA

## 2018-03-10 HISTORY — DX: Traumatic subdural hemorrhage with loss of consciousness of unspecified duration, initial encounter: S06.5X9A

## 2018-03-10 LAB — PROTIME-INR
INR: 2.74
Prothrombin Time: 28.8 seconds — ABNORMAL HIGH (ref 11.4–15.2)

## 2018-03-10 LAB — POCT INR: INR: 3.9

## 2018-03-10 MED ORDER — EMTRICITABINE-TENOFOVIR AF 200-25 MG PO TABS
1.0000 | ORAL_TABLET | Freq: Every day | ORAL | Status: DC
  Administered 2018-03-10: 1 via ORAL
  Filled 2018-03-10 (×2): qty 1

## 2018-03-10 MED ORDER — ACETAMINOPHEN 325 MG PO TABS: 650.0000 mg | ORAL_TABLET | Freq: Four times a day (QID) | ORAL | Status: DC | PRN

## 2018-03-10 MED ORDER — DRONABINOL 2.5 MG PO CAPS: 5.0000 mg | ORAL_CAPSULE | Freq: Two times a day (BID) | ORAL | Status: DC

## 2018-03-10 MED ORDER — SODIUM POLYSTYRENE SULFONATE PO POWD
30.0000 g | Freq: Once | ORAL | Status: AC
  Administered 2018-03-10: 30 g via ORAL
  Filled 2018-03-10: qty 30

## 2018-03-10 MED ORDER — ACETAMINOPHEN 650 MG RE SUPP: 650.0000 mg | Freq: Four times a day (QID) | RECTAL | Status: DC | PRN

## 2018-03-10 MED ORDER — OXYCODONE-ACETAMINOPHEN 10-325 MG PO TABS: 1.0000 | ORAL_TABLET | Freq: Three times a day (TID) | ORAL | Status: DC | PRN

## 2018-03-10 MED ORDER — OXYCODONE-ACETAMINOPHEN 5-325 MG PO TABS
1.0000 | ORAL_TABLET | Freq: Three times a day (TID) | ORAL | Status: DC | PRN
  Administered 2018-03-10 – 2018-03-11 (×2): 1 via ORAL
  Filled 2018-03-10 (×2): qty 1

## 2018-03-10 MED ORDER — DOLUTEGRAVIR SODIUM 50 MG PO TABS
50.0000 mg | ORAL_TABLET | Freq: Every day | ORAL | Status: DC
  Administered 2018-03-10: 50 mg via ORAL
  Filled 2018-03-10 (×2): qty 1

## 2018-03-10 MED ORDER — NICOTINE 14 MG/24HR TD PT24
14.0000 mg | MEDICATED_PATCH | Freq: Every day | TRANSDERMAL | Status: DC
  Administered 2018-03-10 – 2018-03-11 (×2): 14 mg via TRANSDERMAL
  Filled 2018-03-10 (×2): qty 1

## 2018-03-10 MED ORDER — CALCIUM ACETATE (PHOS BINDER) 667 MG PO CAPS
667.0000 mg | ORAL_CAPSULE | Freq: Three times a day (TID) | ORAL | Status: DC
  Administered 2018-03-11: 667 mg via ORAL
  Filled 2018-03-10: qty 1

## 2018-03-10 MED ORDER — OXYCODONE HCL 5 MG PO TABS
5.0000 mg | ORAL_TABLET | Freq: Three times a day (TID) | ORAL | Status: DC | PRN
  Administered 2018-03-10 – 2018-03-11 (×2): 5 mg via ORAL
  Filled 2018-03-10 (×2): qty 1

## 2018-03-10 MED ORDER — FLUTICASONE PROPIONATE 50 MCG/ACT NA SUSP
2.0000 | Freq: Every day | NASAL | Status: DC
  Administered 2018-03-11: 2 via NASAL
  Filled 2018-03-10: qty 16

## 2018-03-10 MED ORDER — RAMELTEON 8 MG PO TABS
8.0000 mg | ORAL_TABLET | Freq: Every day | ORAL | Status: DC
  Administered 2018-03-10: 8 mg via ORAL
  Filled 2018-03-10: qty 1

## 2018-03-10 NOTE — Assessment & Plan Note (Signed)
He is compliant with his dialysis schedule on Monday, Wednesday and Friday. Last dialysis was yesterday.  He does not appear volume overloaded today.

## 2018-03-10 NOTE — Progress Notes (Signed)
Patient asked for a diet- told that he is NPO at this time. Called MD to ask about diet order- told it can not be changed at this time. Will continue to monitor

## 2018-03-10 NOTE — Assessment & Plan Note (Signed)
BP Readings from Last 3 Encounters:  03/10/18 (!) 155/84  03/07/18 129/63  02/26/18 (!) 149/103   His blood pressure was elevated to 200/118 on initial presentation. According to him his blood pressure was low during dialysis yesterday. He never took his morning meds today.  Because of his elevated blood pressure, supratherapeutic INR and worsening subdural hematoma on recent CT head he was advised admission for observation and a repeat CT head. Patient went home to take care of his heater and dog and came back after an hour. He will be admitted under IM TS for further evaluation and management.

## 2018-03-10 NOTE — Progress Notes (Signed)
Blood pressure must be taken on the legs both arms are restricted due to fistulas.

## 2018-03-10 NOTE — Progress Notes (Signed)
Brian [name] is a 42 y.o. male patient admitted from ED awake, alert - oriented  X 4 - no acute distress noted.  VSS - Blood pressure (!) 160/82, pulse 92, temperature 97.9 F (36.6 C), temperature source Oral, resp. rate 16, height 6\' 2"  (1.88 m), weight 90.8 kg (200 lb 2.8 oz), SpO2 99 %.    IV in place, occlusive dsg intact without redness.  Orientation to room, and floor completed with information packet given to patient/family.  Patient declined safety video at this time.  Admission INP armband ID verified with patient/family, and in place.   SR up x 2, fall assessment complete, with patient and family able to verbalize understanding of risk associated with falls, and verbalized understanding to call nsg before up out of bed.  Call light within reach, patient able to voice, and demonstrate understanding.  Skin, clean-dry- intact without evidence of bruising, or skin tears.   No evidence of skin break down noted on exam.     Will cont to eval and treat per MD orders.  Holley Raring, RN 03/10/2018 6:37 PM

## 2018-03-10 NOTE — Assessment & Plan Note (Signed)
Patient was advised to stop taking Coumadin during his recent ED visit. Patient continue to take Coumadin as he was worried about clotting during dialysis. His last dose was last night.  Today his INR was 3.9.  Stop Coumadin because of his worsening hematoma. Might need a reversal with vitamin K if a repeat CT head shows further worsening of his existing hematoma or any new bleed. Patient can be considered for IVC filter placement to prevent PE and not to take any anticoagulation because of his recurrent falls and risk of intracranial bleeding.
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
## 2018-03-10 NOTE — Assessment & Plan Note (Signed)
He developed bilateral small subdural hematomas few weeks ago after having a fall during sleep walk. He had another fall last week, during his recent visit to ED a repeat CT head was performed which shows worsening of his right-sided subdural hematoma with midline shift.  With his current worsening of subdural hematoma, elevated blood pressure and supratherapeutic INR he will be admitted to IMTS for further evaluation and management.  He will need a repeat CT head and if there is any further worsening of his hematoma he will need a neurosurgery consult.
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
## 2018-03-11 ENCOUNTER — Telehealth: Payer: Self-pay | Admitting: Internal Medicine

## 2018-03-11 ENCOUNTER — Telehealth: Payer: Self-pay | Admitting: *Deleted

## 2018-03-11 DIAGNOSIS — I120 Hypertensive chronic kidney disease with stage 5 chronic kidney disease or end stage renal disease: Secondary | ICD-10-CM

## 2018-03-11 DIAGNOSIS — Z86711 Personal history of pulmonary embolism: Secondary | ICD-10-CM | POA: Diagnosis not present

## 2018-03-11 DIAGNOSIS — D631 Anemia in chronic kidney disease: Secondary | ICD-10-CM | POA: Diagnosis not present

## 2018-03-11 DIAGNOSIS — S065X0A Traumatic subdural hemorrhage without loss of consciousness, initial encounter: Secondary | ICD-10-CM | POA: Diagnosis not present

## 2018-03-11 DIAGNOSIS — Z86718 Personal history of other venous thrombosis and embolism: Secondary | ICD-10-CM

## 2018-03-11 DIAGNOSIS — Y9384 Activity, sleeping: Secondary | ICD-10-CM

## 2018-03-11 DIAGNOSIS — B20 Human immunodeficiency virus [HIV] disease: Secondary | ICD-10-CM

## 2018-03-11 DIAGNOSIS — G629 Polyneuropathy, unspecified: Secondary | ICD-10-CM

## 2018-03-11 DIAGNOSIS — G2581 Restless legs syndrome: Secondary | ICD-10-CM

## 2018-03-11 DIAGNOSIS — Z21 Asymptomatic human immunodeficiency virus [HIV] infection status: Secondary | ICD-10-CM | POA: Diagnosis not present

## 2018-03-11 DIAGNOSIS — Z79899 Other long term (current) drug therapy: Secondary | ICD-10-CM

## 2018-03-11 DIAGNOSIS — D509 Iron deficiency anemia, unspecified: Secondary | ICD-10-CM | POA: Diagnosis not present

## 2018-03-11 DIAGNOSIS — G8929 Other chronic pain: Secondary | ICD-10-CM

## 2018-03-11 DIAGNOSIS — J3081 Allergic rhinitis due to animal (cat) (dog) hair and dander: Secondary | ICD-10-CM | POA: Diagnosis not present

## 2018-03-11 DIAGNOSIS — N186 End stage renal disease: Secondary | ICD-10-CM

## 2018-03-11 DIAGNOSIS — S065X9D Traumatic subdural hemorrhage with loss of consciousness of unspecified duration, subsequent encounter: Secondary | ICD-10-CM | POA: Diagnosis not present

## 2018-03-11 DIAGNOSIS — Z992 Dependence on renal dialysis: Secondary | ICD-10-CM | POA: Diagnosis not present

## 2018-03-11 DIAGNOSIS — Z23 Encounter for immunization: Secondary | ICD-10-CM | POA: Diagnosis not present

## 2018-03-11 DIAGNOSIS — N2581 Secondary hyperparathyroidism of renal origin: Secondary | ICD-10-CM | POA: Diagnosis not present

## 2018-03-11 DIAGNOSIS — Z79891 Long term (current) use of opiate analgesic: Secondary | ICD-10-CM

## 2018-03-11 DIAGNOSIS — Z7901 Long term (current) use of anticoagulants: Secondary | ICD-10-CM

## 2018-03-11 DIAGNOSIS — W1809XA Striking against other object with subsequent fall, initial encounter: Secondary | ICD-10-CM

## 2018-03-11 LAB — COMPREHENSIVE METABOLIC PANEL
AG Ratio: 1.4 (calc) (ref 1.0–2.5)
ALBUMIN MSPROF: 4.4 g/dL (ref 3.6–5.1)
ALT: 11 U/L (ref 9–46)
AST: 22 U/L (ref 10–40)
Alkaline phosphatase (APISO): 76 U/L (ref 40–115)
BILIRUBIN TOTAL: 0.7 mg/dL (ref 0.2–1.2)
BUN / CREAT RATIO: 4 (calc) — AB (ref 6–22)
BUN: 36 mg/dL — AB (ref 7–25)
CALCIUM: 9.5 mg/dL (ref 8.6–10.3)
CO2: 23 mmol/L (ref 20–32)
Chloride: 92 mmol/L — ABNORMAL LOW (ref 98–110)
Creat: 9.98 mg/dL — ABNORMAL HIGH (ref 0.60–1.35)
GLUCOSE: 94 mg/dL (ref 65–99)
Globulin: 3.2 g/dL (calc) (ref 1.9–3.7)
POTASSIUM: 5.5 mmol/L — AB (ref 3.5–5.3)
SODIUM: 132 mmol/L — AB (ref 135–146)
TOTAL PROTEIN: 7.6 g/dL (ref 6.1–8.1)

## 2018-03-11 LAB — CBC WITH DIFFERENTIAL/PLATELET
BASOS PCT: 0.7 %
Basophils Absolute: 31 cells/uL (ref 0–200)
EOS ABS: 70 {cells}/uL (ref 15–500)
EOS PCT: 1.6 %
HCT: 39 % (ref 38.5–50.0)
HEMOGLOBIN: 13.1 g/dL — AB (ref 13.2–17.1)
Lymphs Abs: 1091 cells/uL (ref 850–3900)
MCH: 32.1 pg (ref 27.0–33.0)
MCHC: 33.6 g/dL (ref 32.0–36.0)
MCV: 95.6 fL (ref 80.0–100.0)
MONOS PCT: 13.3 %
MPV: 11.6 fL (ref 7.5–12.5)
NEUTROS ABS: 2622 {cells}/uL (ref 1500–7800)
Neutrophils Relative %: 59.6 %
Platelets: 163 10*3/uL (ref 140–400)
RBC: 4.08 10*6/uL — ABNORMAL LOW (ref 4.20–5.80)
RDW: 14.5 % (ref 11.0–15.0)
Total Lymphocyte: 24.8 %
WBC mixed population: 585 cells/uL (ref 200–950)
WBC: 4.4 10*3/uL (ref 3.8–10.8)

## 2018-03-11 LAB — BASIC METABOLIC PANEL
ANION GAP: 19 — AB (ref 5–15)
BUN: 47 mg/dL — ABNORMAL HIGH (ref 6–20)
CALCIUM: 9.1 mg/dL (ref 8.9–10.3)
CO2: 19 mmol/L — ABNORMAL LOW (ref 22–32)
CREATININE: 11.81 mg/dL — AB (ref 0.61–1.24)
Chloride: 94 mmol/L — ABNORMAL LOW (ref 101–111)
GFR calc non Af Amer: 5 mL/min — ABNORMAL LOW (ref >60)
GFR, EST AFRICAN AMERICAN: 5 mL/min — AB (ref >60)
Glucose, Bld: 83 mg/dL (ref 65–99)
Potassium: 5.1 mmol/L (ref 3.5–5.1)
SODIUM: 132 mmol/L — AB (ref 135–145)

## 2018-03-11 LAB — LIPID PANEL
CHOL/HDL RATIO: 3.4 (calc) (ref <5.0)
CHOLESTEROL: 136 mg/dL (ref <200)
HDL: 40 mg/dL — ABNORMAL LOW (ref >40)
LDL Cholesterol (Calc): 70 mg/dL (calc)
Non-HDL Cholesterol (Calc): 96 mg/dL (calc) (ref <130)
Triglycerides: 193 mg/dL — ABNORMAL HIGH (ref <150)

## 2018-03-11 LAB — CBC
HCT: 38 % — ABNORMAL LOW (ref 39.0–52.0)
HEMOGLOBIN: 11.9 g/dL — AB (ref 13.0–17.0)
MCH: 31.7 pg (ref 26.0–34.0)
MCHC: 31.3 g/dL (ref 30.0–36.0)
MCV: 101.3 fL — ABNORMAL HIGH (ref 78.0–100.0)
Platelets: 132 10*3/uL — ABNORMAL LOW (ref 150–400)
RBC: 3.75 MIL/uL — ABNORMAL LOW (ref 4.22–5.81)
RDW: 15.1 % (ref 11.5–15.5)
WBC: 4.5 10*3/uL (ref 4.0–10.5)

## 2018-03-11 LAB — RPR: RPR Ser Ql: NONREACTIVE

## 2018-03-11 LAB — T-HELPER CELL (CD4) - (RCID CLINIC ONLY)
CD4 % Helper T Cell: 40 % (ref 33–55)
CD4 T CELL ABS: 480 /uL (ref 400–2700)

## 2018-03-11 LAB — PROTIME-INR
INR: 2.51
PROTHROMBIN TIME: 26.9 s — AB (ref 11.4–15.2)

## 2018-03-11 LAB — MRSA PCR SCREENING: MRSA by PCR: NEGATIVE

## 2018-03-11 MED ORDER — GABAPENTIN 300 MG PO CAPS: ORAL_CAPSULE | ORAL | Status: DC

## 2018-03-11 NOTE — Discharge Summary (Signed)
Name: Brian [name] MRN: [id number] DOB: Nov 21, 1976 42 y.o. PCP: Ledell Noss, MD  Date of Admission: 03/10/2018  6:13 PM Date of Discharge: 03/11/2018 Attending Physician: Lucious Groves, DO  Discharge Diagnosis: 1. Subdural hematoma  2. History of DVT and PE 3. ESRD  4. HTN  5. HIV  6. Peripheral neuropathy  7.  Restless leg syndrome   Active Problems:   Human immunodeficiency virus (HIV) disease (HCC)   Hypertension   ESRD (end stage renal disease) ([place])   History of DVT (deep vein thrombosis)   Restless leg syndrome   Peripheral neuropathy   Subdural hematoma (HCC)   Subdural bleeding [facility])   Discharge Medications: Allergies as of 03/11/2018      Reactions   Cat Hair Extract Other (See Comments)   SNEEZING      Medication List    STOP taking these medications   doxycycline 100 MG capsule Commonly known as:  VIBRAMYCIN   naproxen sodium 220 MG tablet Commonly known as:  ALEVE   warfarin 5 MG tablet Commonly known as:  COUMADIN     TAKE these medications   amLODipine 10 MG tablet Commonly known as:  NORVASC Take 10 mg by mouth at bedtime.   BIOTIN PO Take 1 tablet by mouth at bedtime.   buPROPion 100 MG tablet Commonly known as:  WELLBUTRIN Take 1 tablet (100 mg total) by mouth daily.   calcium acetate 667 MG capsule Commonly known as:  PHOSLO Take 667 mg by mouth See admin instructions. Take 667 mg by mouth daily with biggest meal and take 667 mg by mouth with snacks   dolutegravir 50 MG tablet Commonly known as:  TIVICAY Take 1 tablet (50 mg total) by mouth daily.   dronabinol 5 MG capsule Commonly known as:  MARINOL Take 1 capsule (5 mg total) by mouth 2 (two) times daily before lunch and supper. Do not fill before 30 days from prior   emtricitabine-tenofovir AF 200-25 MG tablet Commonly known as:  DESCOVY Take 1 tablet by mouth daily.   fluticasone 50 MCG/ACT nasal spray Commonly known as:  FLONASE USE 2 SPRAYS IN EACH NOSTRIL  DAILY   gabapentin 300 MG capsule Commonly known as:  NEURONTIN Take 300 mg by mouth once a day What changed:  additional instructions   GENERLAC 10 GM/15ML Soln Generic drug:  lactulose (encephalopathy) Take 20 g by mouth daily.   neomycin-bacitracin-polymyxin ointment Commonly known as:  NEOSPORIN Apply 1 application topically as needed for wound care.   neomycin-polymyxin b-dexamethasone 3.5-10000-0.1 Susp Commonly known as:  MAXITROL Place 3 drops into both eyes daily.   nicotine 21 mg/24hr patch Commonly known as:  NICODERM CQ - dosed in mg/24 hours Place 21 mg onto the skin daily.   ondansetron 4 MG disintegrating tablet Commonly known as:  ZOFRAN ODT Take 1 tablet (4 mg total) by mouth every 8 (eight) hours as needed for nausea or vomiting.   oxyCODONE-acetaminophen 10-325 MG tablet Commonly known as:  PERCOCET Take 1 tablet by mouth every 8 (eight) hours as needed for up to 90 doses for pain. Do not fill before 30 days from prior   pramipexole 0.25 MG tablet Commonly known as:  MIRAPEX Take 2 tablets (0.5 mg total) by mouth at bedtime.   PROAIR HFA 108 (90 Base) MCG/ACT inhaler Generic drug:  albuterol INHALE 2 PUFFS EVERY 4  HRS AS NEEDED FOR WHEEZING SHORTNESS OF BREATH What changed:  See the new instructions.  Disposition and follow-up:   Brian [name] was discharged from [facility] in Stable condition.  At the hospital follow up visit please address:  1.  Please assess for symptoms of increased intracranial pressure such as headache, N/V, drowsiness, AMS. Please ensure patient has stopped taking warfarin and continue to discuss option for IVC filter. Please ensure patient has follow up with neurosurgery.    2.  Labs / imaging needed at time of follow-up: None   3.  Pending labs/ test needing follow-up: None   Follow-up Appointments: Follow-up Information    [facility] Follow up.   Why:  You have a  follow up appointment on 4/2 at 2:15pm.  Contact information: [address] [place] [place] [facility] Course by problem list: Active Problems:   Human immunodeficiency virus (HIV) disease ([place])   Hypertension   ESRD (end stage renal disease) ([place])   History of DVT (deep vein thrombosis)   Restless leg syndrome   Peripheral neuropathy   Subdural hematoma (HCC)   Subdural bleeding (HCC)   1. Subdural hematoma: Patient has a history of multiple PEs and DTVs in the past for which he is on Coumadin. He sustained a fall on 2/21 and evaluation in the ED revealed a R subdural hematoma that did not require surgical intervention. Three days prior to presentation he developed a headache associated with nausea and vomiting and proceeded to go to the ED for further evaluation at which time a head CT revealed progression of right subdural hematoma.  Case was consulted with neurosurgery at the time which recommended stopping Coumadin and outpatient follow-up.  However, patient has continued to take Coumadin due to concern for DVT and PE and has yet to follow-up with neurosurgery.  He presented to clinic on 3/26 with complaints of worsening headache as well as nausea and vomiting and he was admitted for concern of further progression of subdural hematoma.  On admission stat head CT was ordered which showed stable right SDH.  Patient was recommended to stop Coumadin and consider an IVC filter. He stated he needed time to think and read about this. He was referred to neurosurgery and has a follow-up appointment at [facility] at which time he will did discuss IVC filter.  2. History of DVT and PE: As discussed above patient on Coumadin for this and recommended to stop on discharge in the setting of recent progression of subdural hematoma.  He was also advised to stop taking NSAIDs.  3. ESRD: Patient did not require HD during his admission as he was only admitted for 1 night.  He  was scheduled for hemodialysis on day of discharge and was advised to go to his outpatient hemodialysis center for this.  4. HTN: Normotensive during his admission.  No changes to his home medications.  5. HIV: Well controlled. Home medications Descovy and Tivicay were continued during his admission.  6. Peripheral neuropathy: Home gabapentin initially held during admission due to concern for progression of subdural hematoma.  This was resumed on discharge at a lower dose of 300 mg daily due to ESRD.  Is not sure patient has been compliant with this new dose.  7.  Restless leg syndrome: Home pramipexole initially held during his admission due to concern for progression of subdural hematoma.  This was resumed on discharge.   Discharge Vitals:   BP (!) 155/69 (BP Location: Right Leg)  Pulse 69   Temp 97.7 F (36.5 C)   Resp 18   Ht 6\' 2"  (1.88 m)   Wt 200 lb 2.8 oz (90.8 kg)   SpO2 96%   BMI 25.70 kg/m   Pertinent Labs, Studies, and Procedures:   CBC Latest Ref Rng & Units 03/11/2018 03/10/2018 03/07/2018  WBC 4.0 - 10.5 K/uL 4.5 4.4 4.4  Hemoglobin 13.0 - 17.0 g/dL 11.9(L) 13.1(L) 12.7(L)  Hematocrit 39.0 - 52.0 % 38.0(L) 39.0 39.9  Platelets 150 - 400 K/uL 132(L) 163 142(L)   BMP Latest Ref Rng & Units 03/11/2018 03/10/2018 03/07/2018  Glucose 65 - 99 mg/dL 83 94 94  BUN 6 - 20 mg/dL 47(H) 36(H) 39(H)  Creatinine 0.61 - 1.24 mg/dL 11.81(H) 9.98(H) 11.67(H)  BUN/Creat Ratio 6 - 22 (calc) - 4(L) -  Sodium 135 - 145 mmol/L 132(L) 132(L) 131(L)  Potassium 3.5 - 5.1 mmol/L 5.1 5.5(H) 5.3(H)  Chloride 101 - 111 mmol/L 94(L) 92(L) 91(L)  CO2 22 - 32 mmol/L 19(L) 23 22  Calcium 8.9 - 10.3 mg/dL 9.1 9.5 9.3   Head CT 3/26: IMPRESSION: 1. Stable since 03/07/2018 lobulated right side isodense subdural hematoma measuring up to 12 mm in thickness. Trace if any left side subdural hematoma. 2. Stable mild intracranial mass effect with leftward midline shift of 4 mm. 3. No new intracranial  abnormality.  Discharge Instructions: Discharge Instructions    Ambulatory referral to Neurosurgery   Complete by:  As directed    For subdural hematoma   Call MD for:  difficulty breathing, headache or visual disturbances   Complete by:  As directed    Call MD for:  extreme fatigue   Complete by:  As directed    Call MD for:  persistant nausea and vomiting   Complete by:  As directed    Diet - low sodium heart healthy   Complete by:  As directed    Discharge instructions   Complete by:  As directed    Mr. Brammer,  It was a pleasure to take care of you in the hospital. While you were here, you had another CT scan of your head that showed that the bleed in your head is not any better or worse. We talked about the dangers of continuing on coumadin and about different options for preventing clots, including an IVC filter (inferior vena cava). Please think about what option you would like to go forward with and we can set you up with the Radiologists (who place IVC filters) if you choose that option. In the meantime, please STOP taking warfarin.    - Please DO NOT take warfarin. - Please also DO NOT take aleve or other NSAIDs.   We will also need to set you up with the Neurosurgery clinic to see you about the bleed in your head. We will make a referral for this and they will call you to set up this appointment.   Please also take gabapentin 300mg  ONCE A DAY. This may help with your dizziness and decrease the chance that you will fall. If you have any questions or concerns, call our clinic at [phone] or after hours call [phone] and ask for the internal medicine resident on call.  You have a follow up appointment with [place] at the [facility] on 4/2 at 2:15pm. Please call us if you need to see [place] sooner.  Please make sure to go to your dialysis today.   Increase activity slowly   Complete by:  As directed       Signed: Welford Roche, MD 03/14/2018, 7:26 AM     Pager: [phone]

## 2018-03-11 NOTE — Telephone Encounter (Signed)
Lm for rtc 
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
## 2018-03-11 NOTE — Progress Notes (Signed)
Brian [name] to be D/C'd Home per MD order.  Discussed with the patient and all questions fully answered.  VSS, Skin clean, dry and intact without evidence of skin break down, no evidence of skin tears noted. IV catheter discontinued intact. Site without signs and symptoms of complications. Dressing and pressure applied.  An After Visit Summary was printed and given to the patient. Patient received prescription.  D/c education completed with patient/family including follow up instructions, medication list, d/c activities limitations if indicated, with other d/c instructions as indicated by MD - patient able to verbalize understanding, all questions fully answered.   Patient instructed to return to ED, call 911, or call MD for any changes in condition.   Patient escorted via [place], and D/C home via private auto.  Luci Bank 03/11/2018 11:28 AM

## 2018-03-11 NOTE — Evaluation (Signed)
Physical Therapy Evaluation and Discharge  Patient Details Name: Brian [name] MRN: [id number] DOB: [birth year] Today's Date: 03/11/2018   History of Present Illness  Pt is a 42 y/o male admitted secondary to worsening headache. Pt with recent fall resulting in R side subdural hematoma. On 3/23 pt presenting to ED and CT showed progression of subdural hematoma. CT on 3/26 showed stable R sided subdural hematoma. PMH includes HIV, ESRD on HD MWF, COPD, HTN, restless leg syndrome, DVT, PE, tobacco abuse, and thoracic aortic aneurysm.   Clinical Impression  Patient evaluated by Physical Therapy with no further acute PT needs identified. All education has been completed and the patient has no further questions. Pt requiring supervision for gait and stair navigation. Pt reports he is at baseline level of function and doesn't feel like he needs further skilled PT. Pt very anxious to get to his HD appointment; notified RN. See below for any follow-up Physical Therapy or equipment needs. PT is signing off. Thank you for this referral. If needs change, please reconsult.     Follow Up Recommendations No PT follow up;Supervision - Intermittent    Equipment Recommendations  None recommended by PT    Recommendations for Other Services       Precautions / Restrictions Precautions Precautions: Fall Precaution Comments: Pt reports no falls since falling in February. Pt reports falls occurs when he is sleepwalking.  Restrictions Weight Bearing Restrictions: No      Mobility  Bed Mobility Overal bed mobility: Independent                Transfers Overall transfer level: Independent                  Ambulation/Gait Ambulation/Gait assistance: Supervision Ambulation Distance (Feet): 150 Feet Assistive device: None Gait Pattern/deviations: WFL(Within Functional Limits) Gait velocity: WFL  Gait velocity interpretation: at or above normal speed for age/gender General Gait Details:  Overall steady gait with good gait speed. No LOB noted. Pt reports he is at his baseline for ambulation.   Stairs Stairs: Yes Stairs assistance: Supervision Stair Management: Alternating pattern;Forwards(wall (reports he has one at apartment)) Number of Stairs: 10 General stair comments: Overall steady stair navigation using step to pattern. No LOB noted. Pt used R sided wall for steadying.   Wheelchair Mobility    Modified Rankin (Stroke Patients Only)       Balance Overall balance assessment: Needs assistance Sitting-balance support: No upper extremity supported;Feet supported Sitting balance-Leahy Scale: Normal     Standing balance support: No upper extremity supported;During functional activity Standing balance-Leahy Scale: Good                               Pertinent Vitals/Pain Pain Assessment: Faces Faces Pain Scale: Hurts little more Pain Location: generalized  Pain Descriptors / Indicators: Aching Pain Intervention(s): Limited activity within patient's tolerance;Monitored during session;Repositioned    Home Living Family/patient expects to be discharged to:: Private residence Living Arrangements: Alone Available Help at Discharge: Family;Available PRN/intermittently Type of Home: Apartment Home Access: Stairs to enter Entrance Stairs-Rails: None Entrance Stairs-Number of Steps: 8 Home Layout: One level Home Equipment: Hand held shower head      Prior Function Level of Independence: Independent         Comments: Still driving      Hand Dominance   Dominant Hand: Right    Extremity/Trunk Assessment   Upper Extremity Assessment Upper Extremity Assessment: Overall  WFL for tasks assessed    Lower Extremity Assessment Lower Extremity Assessment: LLE deficits/detail LLE Deficits / Details: Reports L knee pops out of place occasionally at baseline. No problems reported during session.     Cervical / Trunk Assessment Cervical / Trunk  Assessment: Normal  Communication   Communication: No difficulties  Cognition Arousal/Alertness: Awake/alert Behavior During Therapy: WFL for tasks assessed/performed Overall Cognitive Status: Within Functional Limits for tasks assessed                                        General Comments General comments (skin integrity, edema, etc.): Pt reports he is at his baseline and does not need any further PT services. Very anxious about making his HD appointment on time; notified RN.     Exercises     Assessment/Plan    PT Assessment Patent does not need any further PT services  PT Problem List         PT Treatment Interventions      PT Goals (Current goals can be found in the Care Plan section)  Acute Rehab PT Goals Patient Stated Goal: to get to HD on time  PT Goal Formulation: With patient Time For Goal Achievement: 03/11/18 Potential to Achieve Goals: Good    Frequency     Barriers to discharge        Co-evaluation               AM-PAC PT "6 Clicks" Daily Activity  Outcome Measure Difficulty turning over in bed (including adjusting bedclothes, sheets and blankets)?: None Difficulty moving from lying on back to sitting on the side of the bed? : None Difficulty sitting down on and standing up from a chair with arms (e.g., wheelchair, bedside commode, etc,.)?: None Help needed moving to and from a bed to chair (including a wheelchair)?: None Help needed walking in hospital room?: None Help needed climbing 3-5 steps with a railing? : None 6 Click Score: 24    End of Session Equipment Utilized During Treatment: Gait belt Activity Tolerance: Patient tolerated treatment well Patient left: in bed;with call bell/phone within reach Nurse Communication: Mobility status;Other (comment)(Pt reports he needs to d/c to make HD appointment ) PT Visit Diagnosis: Other abnormalities of gait and mobility (R26.89);Pain Pain - part of body: (head; generalized )     Time: 1100-1113 PT Time Calculation (min) (ACUTE ONLY): 13 min   Charges:   PT Evaluation $PT Eval Low Complexity: 1 Low     PT G Codes:        Leighton Ruff, PT, DPT  Acute Rehabilitation Services  Pager: [phone]   Rudean Hitt 03/11/2018, 11:52 AM

## 2018-03-11 NOTE — Progress Notes (Signed)
Tele called several times that leads were off patient and he was not being monitored. RN able to see patient on monitor at desk- he was assessed each time by this RN or charge nurse. Will continue to monitor

## 2018-03-11 NOTE — Patient Instructions (Signed)
Patient educated about medication as defined in this encounter and verbalized understanding by repeating back instructions provided.   

## 2018-03-11 NOTE — Telephone Encounter (Signed)
F/U hospital per Dr Frederico Hamman; pt appt is 04/02 215pm

## 2018-03-11 NOTE — Telephone Encounter (Signed)
[facility] called regarding patient's Creatinine lab result from 3/26. Patient is currently on dialysis Monday/Wednesday/Friday. Patient was admitted to the hospital 3/27 by PCP for monitoring/evaluation of subdural hematoma. Landis Gandy, RN

## 2018-03-11 NOTE — Progress Notes (Signed)
Subjective:  Brian [name] was seen sitting up in bed this morning. Reports mild headache and nausea, but no emesis - improved since admission. Tolerating PO intake. Discussed results of CT head that shows persistent intracranial bleed that has not improved or worsened. Has not seen Neurosurgeon or PCP recently. Discussed rationale for IVC filter placement with hx of DVT/PEs and risks/benefits of warfarin therapy. Patient states he wants to think about the difficult decision a little more and discuss with his friends and family prior to moving forward.   He states he re-started the warfarin recently because he was concerned about the clots that happened in dialysis. We advised that dialysis center can use heparin as a better option with shorter half-life during the dialysis sessions.  Objective:  Vital signs in last 24 hours: Vitals:   03/10/18 1828 03/10/18 1829 03/10/18 2119 03/11/18 0547  BP:  (!) 160/82 (!) 119/93 (!) 155/69  Pulse:  92 83 69  Resp:  16 17 18   Temp:  97.9 F (36.6 C) 98.1 F (36.7 C) 97.7 F (36.5 C)  TempSrc:  Oral Oral   SpO2:  99% 99% 96%  Weight: 200 lb 2.8 oz (90.8 kg)     Height: 6\' 2"  (1.88 m)      Physical Exam  Constitutional: He is oriented to person, place, and time. He appears well-developed and well-nourished. No distress.  Eyes:  Bilateral conjuctival injection  Cardiovascular: Normal rate, regular rhythm and normal heart sounds. Exam reveals no friction rub.  No murmur heard. Pulmonary/Chest: Effort normal. No respiratory distress.  Abdominal: Soft. He exhibits no distension.  Musculoskeletal: He exhibits no edema.  Neurological: He is alert and oriented to person, place, and time.    Assessment/Plan:  Active Problems:   Human immunodeficiency virus (HIV) disease ([place])   Hypertension   ESRD (end stage renal disease) ([place])   History of DVT (deep vein thrombosis)   Restless leg syndrome   Peripheral neuropathy   Subdural hematoma ([facility])  Subdural bleeding ([place])  # Subdural hematoma: Found on 02/05/2018 after a fall during sleep walking.He presents to clinic with HA, nausea, and vomiting and review of chart revealed progression of hematoma on head CT done in ED 3 days ago.  Case discussed with neurosurgery on 3/23 who recommended stopping Coumadin and outpatient follow up which does not appear patient has scheduled. He was also found to be hypertensive and a supratherapeutic INR of 3.7 in clinic. No neurological deficits on exam. He was admitted for stat head CT and possible neurosurgery consult if evidence of further progression on imaging. Head CT without stable subdural hematoma. Will need further discussion regarding anticoagulation given history of recurrent DVT and PEs as well as multiple falls. He is feeling better this morning.  - Stop Coumadin. Discussed this extensively with patient and recommended IVC fiulter placement that he will think about in the next few days. Neurosurgery referral placed and clinic staff made aware. He has a follow up appointment with [place] early next week.   # HIV: Follows up with Dr. Linus Salmons. Viral load <20 and CD4 800 on 07/2017.  - Follow up viral load and CD4 collected today   - Continue home Descovy and Tivicay  # ESRD: on HD MWF. Appears euvolemic on exam. K 5.5. Complaining of R-sided chest pain thought to be MSK in nature in the setting of recent R 5th and 6th ribs after a fall.  - Continue home Phoslo  - Advised to go to his  scheduled HD today after discharge   # History of PE and recurrent DVTs: On coumadin. INR 2.7. - Will hold Coumadin for now. See above.   # HTN: Blood pressure elevated in clinic. He is supposed to be on amlodipine at home but has not been taking it. BP improved to 119/93 when patient arrived to the floor. Will continue to monitor.  - Resume amlodipine on discharge.   # Restless leg syndrome:  - Holding home pramipexole   # Peripheral neuropathy:  - Holding home  gabapentin   # Chronic pain:  - Continue home Percocet 10-325 q8h PRN. Ordered as Percocet 5 and Oxy IR 5 q8h PRN per pharmacy.    Dispo: Anticipated discharge today.   Colbert Ewing, MD 03/11/2018, 9:27 AM Pager: Mamie Nick [phone]

## 2018-03-13 LAB — HIV-1 RNA QUANT-NO REFLEX-BLD
HIV 1 RNA QUANT: DETECTED {copies}/mL — AB
HIV-1 RNA QUANT, LOG: DETECTED {Log_copies}/mL — AB

## 2018-03-13 NOTE — Progress Notes (Signed)
Internal Medicine Clinic Attending  Case discussed with Dr. Amin at the time of the visit.  We reviewed the resident's history and exam and pertinent patient test results.  I agree with the assessment, diagnosis, and plan of care documented in the resident's note.    

## 2018-03-16 ENCOUNTER — Ambulatory Visit: Payer: Self-pay

## 2018-03-16 DIAGNOSIS — E875 Hyperkalemia: Secondary | ICD-10-CM | POA: Diagnosis not present

## 2018-03-16 DIAGNOSIS — Z992 Dependence on renal dialysis: Secondary | ICD-10-CM | POA: Diagnosis not present

## 2018-03-16 DIAGNOSIS — D631 Anemia in chronic kidney disease: Secondary | ICD-10-CM | POA: Diagnosis not present

## 2018-03-16 DIAGNOSIS — I120 Hypertensive chronic kidney disease with stage 5 chronic kidney disease or end stage renal disease: Secondary | ICD-10-CM | POA: Diagnosis not present

## 2018-03-16 DIAGNOSIS — N186 End stage renal disease: Secondary | ICD-10-CM | POA: Diagnosis not present

## 2018-03-16 DIAGNOSIS — D509 Iron deficiency anemia, unspecified: Secondary | ICD-10-CM | POA: Diagnosis not present

## 2018-03-16 DIAGNOSIS — N2581 Secondary hyperparathyroidism of renal origin: Secondary | ICD-10-CM | POA: Diagnosis not present

## 2018-03-16 NOTE — Telephone Encounter (Signed)
Transition Care Management Follow-up Telephone Call   Date discharged?03/11/18   How have you been since you were released from the hospital? "I'm alright but still a little weak but better than I was"   Do you understand why you were in the hospital?yes   Do you understand the discharge instructions? yes   Where were you discharged to? home   Items Reviewed:  Medications reviewed: no- patient was at dialysis center & not able to talk to the whole TOC call  Allergies reviewed:  Dietary changes reviewed:   Referrals reviewed:   Functional Questionnaire:   Activities of Daily Living (ADLs):   He states they are independent in the following: independent on adls States they require assistance with the following:    Any transportation issues/concerns?:   Any patient concerns?none   Confirmed importance and date/time of follow-up visits scheduled yes-reminded of appt tomorrow @ 2:15 pm  Provider Appointment booked with  Confirmed with patient if condition begins to worsen call PCP or go to the ER.  Patient was given the office number and encouraged to call back with question or concerns.  : yes

## 2018-03-17 ENCOUNTER — Ambulatory Visit: Payer: Self-pay

## 2018-03-17 ENCOUNTER — Ambulatory Visit: Payer: Self-pay | Admitting: Pharmacist

## 2018-03-18 ENCOUNTER — Other Ambulatory Visit: Payer: Self-pay | Admitting: Internal Medicine

## 2018-03-18 DIAGNOSIS — D509 Iron deficiency anemia, unspecified: Secondary | ICD-10-CM | POA: Diagnosis not present

## 2018-03-18 DIAGNOSIS — E875 Hyperkalemia: Secondary | ICD-10-CM | POA: Diagnosis not present

## 2018-03-18 DIAGNOSIS — Z7901 Long term (current) use of anticoagulants: Secondary | ICD-10-CM

## 2018-03-18 DIAGNOSIS — B20 Human immunodeficiency virus [HIV] disease: Secondary | ICD-10-CM

## 2018-03-18 DIAGNOSIS — D631 Anemia in chronic kidney disease: Secondary | ICD-10-CM | POA: Diagnosis not present

## 2018-03-18 DIAGNOSIS — N2581 Secondary hyperparathyroidism of renal origin: Secondary | ICD-10-CM | POA: Diagnosis not present

## 2018-03-18 DIAGNOSIS — N186 End stage renal disease: Secondary | ICD-10-CM | POA: Diagnosis not present

## 2018-03-19 ENCOUNTER — Encounter: Payer: Self-pay | Admitting: Internal Medicine

## 2018-03-19 ENCOUNTER — Ambulatory Visit (INDEPENDENT_AMBULATORY_CARE_PROVIDER_SITE_OTHER): Payer: Medicare Other | Admitting: Internal Medicine

## 2018-03-19 ENCOUNTER — Other Ambulatory Visit: Payer: Self-pay

## 2018-03-19 VITALS — BP 156/89 | HR 106 | Temp 98.0°F | Ht 74.0 in | Wt 200.0 lb

## 2018-03-19 DIAGNOSIS — Z79899 Other long term (current) drug therapy: Secondary | ICD-10-CM

## 2018-03-19 DIAGNOSIS — S065XAA Traumatic subdural hemorrhage with loss of consciousness status unknown, initial encounter: Secondary | ICD-10-CM

## 2018-03-19 DIAGNOSIS — Z7901 Long term (current) use of anticoagulants: Secondary | ICD-10-CM | POA: Diagnosis not present

## 2018-03-19 DIAGNOSIS — G4700 Insomnia, unspecified: Secondary | ICD-10-CM

## 2018-03-19 DIAGNOSIS — W0110XD Fall on same level from slipping, tripping and stumbling with subsequent striking against unspecified object, subsequent encounter: Secondary | ICD-10-CM | POA: Diagnosis not present

## 2018-03-19 DIAGNOSIS — S065X9A Traumatic subdural hemorrhage with loss of consciousness of unspecified duration, initial encounter: Secondary | ICD-10-CM

## 2018-03-19 DIAGNOSIS — R296 Repeated falls: Secondary | ICD-10-CM

## 2018-03-19 DIAGNOSIS — Z992 Dependence on renal dialysis: Secondary | ICD-10-CM | POA: Diagnosis not present

## 2018-03-19 DIAGNOSIS — F419 Anxiety disorder, unspecified: Secondary | ICD-10-CM

## 2018-03-19 DIAGNOSIS — I120 Hypertensive chronic kidney disease with stage 5 chronic kidney disease or end stage renal disease: Secondary | ICD-10-CM | POA: Diagnosis not present

## 2018-03-19 DIAGNOSIS — F17210 Nicotine dependence, cigarettes, uncomplicated: Secondary | ICD-10-CM

## 2018-03-19 DIAGNOSIS — Z9181 History of falling: Secondary | ICD-10-CM

## 2018-03-19 DIAGNOSIS — N186 End stage renal disease: Secondary | ICD-10-CM | POA: Diagnosis not present

## 2018-03-19 DIAGNOSIS — Z86718 Personal history of other venous thrombosis and embolism: Secondary | ICD-10-CM

## 2018-03-19 DIAGNOSIS — G479 Sleep disorder, unspecified: Secondary | ICD-10-CM | POA: Insufficient documentation

## 2018-03-19 DIAGNOSIS — F513 Sleepwalking [somnambulism]: Secondary | ICD-10-CM

## 2018-03-19 DIAGNOSIS — N2889 Other specified disorders of kidney and ureter: Secondary | ICD-10-CM

## 2018-03-19 DIAGNOSIS — I151 Hypertension secondary to other renal disorders: Secondary | ICD-10-CM

## 2018-03-19 DIAGNOSIS — S065X9D Traumatic subdural hemorrhage with loss of consciousness of unspecified duration, subsequent encounter: Secondary | ICD-10-CM

## 2018-03-19 NOTE — Patient Instructions (Signed)
It was good seeing you today. I'm glad you are feeling a little better since hospital discharge.   Today we talked about your Subdural Hematoma. It is important that you continue to hold Coumadin during this period to reduce risk of progression. Your referral to neurosurgery is pending, and they will call you soon with an appointment.   Today we also talked about your insomnia and sleep-walking. I am going to refer you to a Sleep Specialist who should be able to provide more information and hopefully assist with treatment.   Please continue your dialysis as scheduled. Please mention your itching and high-blood pressure as well.   Please come back and see [place] in 1 month for follow-up of your Subdural Hematoma and Headaches.

## 2018-03-19 NOTE — Assessment & Plan Note (Signed)
Assessment: Brian [name] does have a strong indication for long-term anticoagulation given his recurrent history of DVT/PE, however he has BL SDH and frequent falls. Problem List shows "clotting disorder" but unable to find recent information on this. Discussed IVC placement, he will continue to read on this and decide at follow-up visit.   Plan: Continue to hold anticoagulation. Patient investigating IVC placement. Would need referral to IR if agreeable.

## 2018-03-19 NOTE — Assessment & Plan Note (Addendum)
Assessment: Brian [name] main complaint today was issues with his long-standing problems with insomnia and sleep walking. He describes a 3-day period last week without sleep despite being profoundly fatigued. He reports laying down for bed and being unable to turn his mind off despite being physically exhausted. He's dealt with this for many many years and chart review shows it was unresponsive to benzodiazepines and trazodone in the past. He requested Klonopin or Valium today, but was refused due to need to reduce centrally-acting agents going forward and also its ineffectiveness in the past.  He also describes frequent episodes of "sleep walking." He does not remember the events, but often finds his home in disarray and has attributed his frequent falls to this. He has never been seen by sleep specialist for evaluation.   Plan: Patient would benefit significantly from referral to sleep specialist for further evaluation. Sleep study was ordered but patient feels unable to complete due to his insomnia. We will defer any medication changes today and re-emphasized the importance of sleep hygiene.  -Refer to Sleep Specialist

## 2018-03-19 NOTE — Assessment & Plan Note (Addendum)
Assessment: Mr. Kaufman presents today for follow-up evaluation of BL SDH. He has had improved headaches and resolution of N/V. Denies any new neurologic deficits and describes no further falls since discharge. He has continued to hold Warfarin, although he is concerned about his risk of recurrent VTE. Referral made to neurosurgery at discharge, information sent to consultant and we are awaiting appointment information.   Plan: Continue to hold anticoagulation as risk of intracranial bleed > VTE at this moment. Referral to neurosurgery is in process. Patient encouraged to reduce fall-risk with lifestyle modifications/home adaptation. We will follow with him closely and he will RTC in 1 month (or sooner if needed).

## 2018-03-19 NOTE — Assessment & Plan Note (Addendum)
Assessment: Hypertensive in clinic to 179/121 with pulse of 114. BP improved to 156/89 with pulse 106 on recheck. He notes compliance with his BP medications today, although did not take them yesterday as his BP has been "dropping" during HD sessions. He is scheduled for HD tomorrow.   Plan: I'm pleased his blood pressure improved on recheck, but will need better BP control moving forward. Given his history of ESRD on HD with reported hypotension during sessions, I will not make any medication changes today but I strongly encouraged him to discuss this with his nephrologist at HD tomorrow.
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
## 2018-03-20 DIAGNOSIS — D509 Iron deficiency anemia, unspecified: Secondary | ICD-10-CM | POA: Diagnosis not present

## 2018-03-20 DIAGNOSIS — E875 Hyperkalemia: Secondary | ICD-10-CM | POA: Diagnosis not present

## 2018-03-20 DIAGNOSIS — D631 Anemia in chronic kidney disease: Secondary | ICD-10-CM | POA: Diagnosis not present

## 2018-03-20 DIAGNOSIS — N186 End stage renal disease: Secondary | ICD-10-CM | POA: Diagnosis not present

## 2018-03-20 DIAGNOSIS — N2581 Secondary hyperparathyroidism of renal origin: Secondary | ICD-10-CM | POA: Diagnosis not present

## 2018-03-20 NOTE — Progress Notes (Signed)
Internal Medicine Clinic Attending  Case discussed with Dr. Molt at the time of the visit.  We reviewed the resident's history and exam and pertinent patient test results.  I agree with the assessment, diagnosis, and plan of care documented in the resident's note. 

## 2018-03-22 ENCOUNTER — Emergency Department (HOSPITAL_COMMUNITY): Payer: Medicare Other

## 2018-03-22 ENCOUNTER — Emergency Department (HOSPITAL_COMMUNITY)
Admission: EM | Admit: 2018-03-22 | Discharge: 2018-03-23 | Disposition: A | Discharge status: Home / Self Care | Payer: Medicare Other | Attending: Emergency Medicine | Admitting: Emergency Medicine

## 2018-03-22 ENCOUNTER — Encounter (HOSPITAL_COMMUNITY): Payer: Self-pay | Admitting: *Deleted

## 2018-03-22 ENCOUNTER — Other Ambulatory Visit: Payer: Self-pay

## 2018-03-22 DIAGNOSIS — N186 End stage renal disease: Secondary | ICD-10-CM | POA: Diagnosis not present

## 2018-03-22 DIAGNOSIS — S42031A Displaced fracture of lateral end of right clavicle, initial encounter for closed fracture: Secondary | ICD-10-CM | POA: Diagnosis not present

## 2018-03-22 DIAGNOSIS — Y92009 Unspecified place in unspecified non-institutional (private) residence as the place of occurrence of the external cause: Secondary | ICD-10-CM | POA: Insufficient documentation

## 2018-03-22 DIAGNOSIS — Y998 Other external cause status: Secondary | ICD-10-CM | POA: Insufficient documentation

## 2018-03-22 DIAGNOSIS — B20 Human immunodeficiency virus [HIV] disease: Secondary | ICD-10-CM | POA: Diagnosis not present

## 2018-03-22 DIAGNOSIS — I5022 Chronic systolic (congestive) heart failure: Secondary | ICD-10-CM | POA: Diagnosis not present

## 2018-03-22 DIAGNOSIS — W01190A Fall on same level from slipping, tripping and stumbling with subsequent striking against furniture, initial encounter: Secondary | ICD-10-CM | POA: Diagnosis not present

## 2018-03-22 DIAGNOSIS — R52 Pain, unspecified: Secondary | ICD-10-CM | POA: Diagnosis not present

## 2018-03-22 DIAGNOSIS — Z992 Dependence on renal dialysis: Secondary | ICD-10-CM | POA: Insufficient documentation

## 2018-03-22 DIAGNOSIS — F17210 Nicotine dependence, cigarettes, uncomplicated: Secondary | ICD-10-CM | POA: Insufficient documentation

## 2018-03-22 DIAGNOSIS — S199XXA Unspecified injury of neck, initial encounter: Secondary | ICD-10-CM | POA: Diagnosis not present

## 2018-03-22 DIAGNOSIS — M25511 Pain in right shoulder: Secondary | ICD-10-CM | POA: Diagnosis not present

## 2018-03-22 DIAGNOSIS — F10120 Alcohol abuse with intoxication, uncomplicated: Secondary | ICD-10-CM | POA: Insufficient documentation

## 2018-03-22 DIAGNOSIS — Y9389 Activity, other specified: Secondary | ICD-10-CM | POA: Diagnosis not present

## 2018-03-22 DIAGNOSIS — M542 Cervicalgia: Secondary | ICD-10-CM | POA: Insufficient documentation

## 2018-03-22 DIAGNOSIS — S4991XA Unspecified injury of right shoulder and upper arm, initial encounter: Secondary | ICD-10-CM | POA: Diagnosis present

## 2018-03-22 DIAGNOSIS — I132 Hypertensive heart and chronic kidney disease with heart failure and with stage 5 chronic kidney disease, or end stage renal disease: Secondary | ICD-10-CM | POA: Insufficient documentation

## 2018-03-22 DIAGNOSIS — S42001A Fracture of unspecified part of right clavicle, initial encounter for closed fracture: Secondary | ICD-10-CM | POA: Diagnosis not present

## 2018-03-22 DIAGNOSIS — S0990XA Unspecified injury of head, initial encounter: Secondary | ICD-10-CM | POA: Diagnosis not present

## 2018-03-22 DIAGNOSIS — W19XXXA Unspecified fall, initial encounter: Secondary | ICD-10-CM

## 2018-03-22 MED ORDER — OXYCODONE HCL 5 MG PO TABS
10.0000 mg | ORAL_TABLET | Freq: Once | ORAL | Status: AC
  Administered 2018-03-22: 10 mg via ORAL
  Filled 2018-03-22: qty 2

## 2018-03-22 NOTE — ED Notes (Signed)
Patient transported to X-ray 

## 2018-03-22 NOTE — ED Triage Notes (Signed)
Pt has deformity to R shoulder/clavicle after sleep walking. Pt reports recent sleeping walking with frequent falls. Pt given 114mcg of fentanyl by EMS. Tonight pt fell through a table and broke 3 ashtrays. Denies hitting his head

## 2018-03-23 ENCOUNTER — Telehealth: Payer: Self-pay | Admitting: Internal Medicine

## 2018-03-23 ENCOUNTER — Telehealth: Payer: Self-pay

## 2018-03-23 DIAGNOSIS — S42031A Displaced fracture of lateral end of right clavicle, initial encounter for closed fracture: Secondary | ICD-10-CM | POA: Diagnosis not present

## 2018-03-23 DIAGNOSIS — S42001A Fracture of unspecified part of right clavicle, initial encounter for closed fracture: Secondary | ICD-10-CM

## 2018-03-23 DIAGNOSIS — S42024A Nondisplaced fracture of shaft of right clavicle, initial encounter for closed fracture: Secondary | ICD-10-CM | POA: Diagnosis not present

## 2018-03-23 MED ORDER — LIDOCAINE 5 % EX PTCH: 1.0000 | MEDICATED_PATCH | CUTANEOUS | 0 refills | Status: DC

## 2018-03-23 MED ORDER — OXYCODONE HCL 5 MG PO TABS: 5.0000 mg | ORAL_TABLET | ORAL | 0 refills | Status: DC | PRN

## 2018-03-23 MED ORDER — FENTANYL CITRATE (PF) 100 MCG/2ML IJ SOLN
50.0000 ug | Freq: Once | INTRAMUSCULAR | Status: AC
  Administered 2018-03-23: 50 ug via INTRAVENOUS
  Filled 2018-03-23: qty 2

## 2018-03-23 NOTE — Telephone Encounter (Signed)
Referral placed.

## 2018-03-23 NOTE — Telephone Encounter (Signed)
Pt girlfriend needs to speak with a nurse about oxycodone. Please call back.

## 2018-03-23 NOTE — Telephone Encounter (Signed)
Per patient's girlfriend Gwenlyn Perking (emergency contact) patient stopped taking percocet due to itching & rash. Has been having this reaction to it for several months. States ER doctor gave him 4 tabs of Oxy IR (5 mg) & patient tolerated it well. Requesting new rx for Oxycodone IR. pls advise!
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
## 2018-03-23 NOTE — ED Provider Notes (Signed)
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  Patient with right shoulder pain and clavicle fracture.  Also has neck pain with possible ligamentous injury Exam : Awake alert appears in mild discomfort tenderness to right shoulder, no evidence of trauma to his legs. Plan: Sling for clavicle injury.  Aspen collar for his neck injury and follow-up with neurosurgery   Ripley Fraise, MD 03/23/18 [phone]

## 2018-03-23 NOTE — Telephone Encounter (Signed)
Patient seen in the Ed yesterday and was urgently referred to Dr Luanna Cole office for an appt today @ 12:15pm.  Pt has a Right Clavicle fracture from a Fall and need to be seen as soon as possible.  Please place a Please place an Urgent referral to [facility] and [employer].   Unable to reach Dr. Hetty Ely this morning for this urgent request.

## 2018-03-23 NOTE — Discharge Instructions (Signed)
You do have a clavicle fracture.  Make sure that you follow-up with Dr. Mardelle Matte with orthopedic surgery.  Take your pain medication as prescribed.  Use the sling.  Have given you lidocaine patch to help with the pain.  Keep the collar on because you possibly have a ligament injury to your neck.  This will be followed up with Dr. Christella Noa also to see you for your head bleed.  Return to the ED with any worsening symptoms.

## 2018-03-23 NOTE — ED Notes (Signed)
Pt able to ambulate on the hallway with slow steady gait and assistance, pt expressing concern about walking at home, pt states he doesn't have any help at home.

## 2018-03-24 ENCOUNTER — Ambulatory Visit (INDEPENDENT_AMBULATORY_CARE_PROVIDER_SITE_OTHER): Payer: Medicare Other | Admitting: Licensed Clinical Social Worker

## 2018-03-24 ENCOUNTER — Ambulatory Visit (INDEPENDENT_AMBULATORY_CARE_PROVIDER_SITE_OTHER): Payer: Medicare Other | Admitting: Internal Medicine

## 2018-03-24 ENCOUNTER — Encounter: Payer: Self-pay | Admitting: Internal Medicine

## 2018-03-24 VITALS — Wt 202.0 lb

## 2018-03-24 DIAGNOSIS — Z5181 Encounter for therapeutic drug level monitoring: Secondary | ICD-10-CM

## 2018-03-24 DIAGNOSIS — B20 Human immunodeficiency virus [HIV] disease: Secondary | ICD-10-CM | POA: Diagnosis present

## 2018-03-24 DIAGNOSIS — F4323 Adjustment disorder with mixed anxiety and depressed mood: Secondary | ICD-10-CM | POA: Diagnosis not present

## 2018-03-24 NOTE — Assessment & Plan Note (Signed)
Doing well with no medication issues.  rtc 6 months.

## 2018-03-24 NOTE — Progress Notes (Signed)
   Subjective:    Patient ID: Brian [name], male    DOB: 1976/10/02, 42 y.o.   MRN: [id number]  HPI Here for follow up of HIV Has had sporadic follow up but good compliance with Tivicay and Descvoy.  This was changed last visit and he is tolerating well.  No associated n/d.  No rashes.  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  Missed diaylsis yesterday but going tomorrow.    Review of Systems  Constitutional: Negative for fatigue and unexpected weight change.  Gastrointestinal: Negative for diarrhea.  Skin: Negative for rash.       Objective:   Physical Exam  Constitutional: He appears well-developed and well-nourished. No distress.  HENT:  Mouth/Throat: No oropharyngeal exudate.  Eyes: No scleral icterus.  Cardiovascular: Normal rate, regular rhythm and normal heart sounds.  No murmur heard. Pulmonary/Chest: Effort normal and breath sounds normal. No respiratory distress.  Skin: No rash noted.   SH: + tobacco       Assessment & Plan:

## 2018-03-24 NOTE — Assessment & Plan Note (Signed)
LFTs wnl.  No issues.

## 2018-03-24 NOTE — BH Specialist Note (Signed)
Integrated Behavioral Health Initial Visit  MRN: [id number] Name: Brian [name]  Number of [place] Clinician visits:: 1/6 Session Start time: 11:07am    Session End time: 11:29am Total time: 20 minutes  Type of Service: [place] Interpretor:No. Interpretor Name and Language: n/a   Warm Hand Off Completed.       SUBJECTIVE: Brian [name] is a 42 y.o. male accompanied by Partner/Significant Other Patient was referred by Dr. Linus Salmons for anxiety and depression. Patient reports the following symptoms/concerns: insomnia (severe), nervousness and worry, feeling down and overwhelmed Severity of problem: moderate  OBJECTIVE: Mood: Anxious and Affect: Constricted Risk of harm to self or others: No plan to harm self or others; reports he has had "thoughts that I ought to just give up and let them send me to hospice" but that he would not do that and would not do anything to bring harm to himself  LIFE CONTEXT: Family and Social: responsible for helping out his elderly grandparents, has sibling in [place] who does not have much contact with, mother is not involved in helping with grandparents (her parents); main support is his ex-girlfriend who has moved to [facility] but visits to help him and to help out with his grandparents Self-Care: reports no self-care measures, but that the best thing he can think of would be to have a day without appointments where he could just lounge around.   GOALS ADDRESSED: Patient will: 1. Reduce symptoms of: anxiety 2. Increase knowledge and/or ability of: healthy habits and stress reduction   INTERVENTIONS: Interventions utilized: Motivational Interviewing, Supportive Counseling and Psychoeducation and/or Health Education   ASSESSMENT: Patient currently experiencing inability to sleep which has resulted in trouble for him at the dialysis clinic due to being late and has kept him from  being a candidate for home dialysis. He believes home dialysis would be a big step toward solving his problems. He reports that scheduling and the amount of appointments is another big stressor.    Patient may benefit from follow up counseling during appointments with medical provider, and between medical appointments as needed.   PLAN: 1. Follow up with behavioral health clinician at his next appointment in 6 months. 2. Referral(s): Counselor to connect patient to patient advocacy   Lillie Fragmin, LCSW

## 2018-03-24 NOTE — Telephone Encounter (Signed)
Thank you, I called and spoke with Brian [name]. I am concerned that he had very frequent falls resulting in subarachnoid hemorrhage last month and now a fracture of collar bone. He definitely needs further workup for the etiology of these syncopal events. 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 Sleep study is scheduled for 4/28 and he has I explained that I am not able to change his pain medication over the phone and he will need to be seen in the office in order to redo the pain contract if we were to make a change. I began the discussion of possibly needing to very slowly taper these medications which can be sedating and may be contributing to his poor sleep, sedation, and falls.

## 2018-03-25 DIAGNOSIS — N2581 Secondary hyperparathyroidism of renal origin: Secondary | ICD-10-CM | POA: Diagnosis not present

## 2018-03-25 DIAGNOSIS — D509 Iron deficiency anemia, unspecified: Secondary | ICD-10-CM | POA: Diagnosis not present

## 2018-03-25 DIAGNOSIS — N186 End stage renal disease: Secondary | ICD-10-CM | POA: Diagnosis not present

## 2018-03-25 DIAGNOSIS — D631 Anemia in chronic kidney disease: Secondary | ICD-10-CM | POA: Diagnosis not present

## 2018-03-25 DIAGNOSIS — E875 Hyperkalemia: Secondary | ICD-10-CM | POA: Diagnosis not present

## 2018-03-26 ENCOUNTER — Telehealth: Payer: Self-pay | Admitting: *Deleted

## 2018-03-26 NOTE — Telephone Encounter (Addendum)
PA for Lidoderm 5% patches was sent to CoverMyMeds .  Awaiting determination from payer.  Sander Nephew, RN 03/26/2018 9:29 AM.  Katrinka Blazing PA request was denied.  Forms placed in Dr. Fredrik Cove box to complete for Appeal.  Sander Nephew, RN 03/27/2018 3:21 PM

## 2018-03-27 DIAGNOSIS — N2581 Secondary hyperparathyroidism of renal origin: Secondary | ICD-10-CM | POA: Diagnosis not present

## 2018-03-27 DIAGNOSIS — N186 End stage renal disease: Secondary | ICD-10-CM | POA: Diagnosis not present

## 2018-03-27 DIAGNOSIS — D509 Iron deficiency anemia, unspecified: Secondary | ICD-10-CM | POA: Diagnosis not present

## 2018-03-27 DIAGNOSIS — D631 Anemia in chronic kidney disease: Secondary | ICD-10-CM | POA: Diagnosis not present

## 2018-03-27 DIAGNOSIS — E875 Hyperkalemia: Secondary | ICD-10-CM | POA: Diagnosis not present

## 2018-03-28 ENCOUNTER — Other Ambulatory Visit: Payer: Self-pay | Admitting: Internal Medicine

## 2018-03-28 DIAGNOSIS — B20 Human immunodeficiency virus [HIV] disease: Secondary | ICD-10-CM

## 2018-03-30 DIAGNOSIS — E875 Hyperkalemia: Secondary | ICD-10-CM | POA: Diagnosis not present

## 2018-03-30 DIAGNOSIS — D509 Iron deficiency anemia, unspecified: Secondary | ICD-10-CM | POA: Diagnosis not present

## 2018-03-30 DIAGNOSIS — D631 Anemia in chronic kidney disease: Secondary | ICD-10-CM | POA: Diagnosis not present

## 2018-03-30 DIAGNOSIS — N2581 Secondary hyperparathyroidism of renal origin: Secondary | ICD-10-CM | POA: Diagnosis not present

## 2018-03-30 DIAGNOSIS — N186 End stage renal disease: Secondary | ICD-10-CM | POA: Diagnosis not present

## 2018-04-01 DIAGNOSIS — N2581 Secondary hyperparathyroidism of renal origin: Secondary | ICD-10-CM | POA: Diagnosis not present

## 2018-04-01 DIAGNOSIS — N186 End stage renal disease: Secondary | ICD-10-CM | POA: Diagnosis not present

## 2018-04-01 DIAGNOSIS — D509 Iron deficiency anemia, unspecified: Secondary | ICD-10-CM | POA: Diagnosis not present

## 2018-04-01 DIAGNOSIS — D631 Anemia in chronic kidney disease: Secondary | ICD-10-CM | POA: Diagnosis not present

## 2018-04-01 DIAGNOSIS — E875 Hyperkalemia: Secondary | ICD-10-CM | POA: Diagnosis not present

## 2018-04-02 ENCOUNTER — Inpatient Hospital Stay (HOSPITAL_COMMUNITY)
Admission: EM | Admit: 2018-04-02 | Discharge: 2018-04-04 | DRG: 291 | Disposition: A | Discharge status: Home / Self Care | Payer: Medicare Other | Attending: Internal Medicine | Admitting: Internal Medicine

## 2018-04-02 ENCOUNTER — Emergency Department (HOSPITAL_COMMUNITY): Payer: Medicare Other

## 2018-04-02 ENCOUNTER — Encounter (HOSPITAL_COMMUNITY): Payer: Self-pay | Admitting: *Deleted

## 2018-04-02 ENCOUNTER — Other Ambulatory Visit: Payer: Self-pay | Admitting: Internal Medicine

## 2018-04-02 ENCOUNTER — Other Ambulatory Visit: Payer: Self-pay

## 2018-04-02 DIAGNOSIS — T424X5A Adverse effect of benzodiazepines, initial encounter: Secondary | ICD-10-CM | POA: Diagnosis present

## 2018-04-02 DIAGNOSIS — G629 Polyneuropathy, unspecified: Secondary | ICD-10-CM | POA: Diagnosis present

## 2018-04-02 DIAGNOSIS — F17210 Nicotine dependence, cigarettes, uncomplicated: Secondary | ICD-10-CM | POA: Diagnosis present

## 2018-04-02 DIAGNOSIS — T426X5A Adverse effect of other antiepileptic and sedative-hypnotic drugs, initial encounter: Secondary | ICD-10-CM | POA: Diagnosis present

## 2018-04-02 DIAGNOSIS — G2581 Restless legs syndrome: Secondary | ICD-10-CM | POA: Diagnosis present

## 2018-04-02 DIAGNOSIS — I248 Other forms of acute ischemic heart disease: Secondary | ICD-10-CM | POA: Diagnosis present

## 2018-04-02 DIAGNOSIS — T428X5A Adverse effect of antiparkinsonism drugs and other central muscle-tone depressants, initial encounter: Secondary | ICD-10-CM | POA: Diagnosis present

## 2018-04-02 DIAGNOSIS — Z8249 Family history of ischemic heart disease and other diseases of the circulatory system: Secondary | ICD-10-CM | POA: Diagnosis not present

## 2018-04-02 DIAGNOSIS — J069 Acute upper respiratory infection, unspecified: Secondary | ICD-10-CM | POA: Diagnosis present

## 2018-04-02 DIAGNOSIS — I952 Hypotension due to drugs: Secondary | ICD-10-CM | POA: Diagnosis present

## 2018-04-02 DIAGNOSIS — J449 Chronic obstructive pulmonary disease, unspecified: Secondary | ICD-10-CM | POA: Diagnosis present

## 2018-04-02 DIAGNOSIS — R079 Chest pain, unspecified: Secondary | ICD-10-CM | POA: Diagnosis not present

## 2018-04-02 DIAGNOSIS — Z9109 Other allergy status, other than to drugs and biological substances: Secondary | ICD-10-CM

## 2018-04-02 DIAGNOSIS — Z9181 History of falling: Secondary | ICD-10-CM

## 2018-04-02 DIAGNOSIS — N2581 Secondary hyperparathyroidism of renal origin: Secondary | ICD-10-CM | POA: Diagnosis present

## 2018-04-02 DIAGNOSIS — R05 Cough: Secondary | ICD-10-CM | POA: Diagnosis not present

## 2018-04-02 DIAGNOSIS — Z992 Dependence on renal dialysis: Secondary | ICD-10-CM

## 2018-04-02 DIAGNOSIS — T391X5A Adverse effect of 4-Aminophenol derivatives, initial encounter: Secondary | ICD-10-CM | POA: Diagnosis present

## 2018-04-02 DIAGNOSIS — G4700 Insomnia, unspecified: Secondary | ICD-10-CM | POA: Diagnosis present

## 2018-04-02 DIAGNOSIS — I712 Thoracic aortic aneurysm, without rupture: Secondary | ICD-10-CM | POA: Diagnosis not present

## 2018-04-02 DIAGNOSIS — Z79899 Other long term (current) drug therapy: Secondary | ICD-10-CM

## 2018-04-02 DIAGNOSIS — I132 Hypertensive heart and chronic kidney disease with heart failure and with stage 5 chronic kidney disease, or end stage renal disease: Secondary | ICD-10-CM | POA: Diagnosis not present

## 2018-04-02 DIAGNOSIS — B20 Human immunodeficiency virus [HIV] disease: Secondary | ICD-10-CM | POA: Diagnosis present

## 2018-04-02 DIAGNOSIS — R0600 Dyspnea, unspecified: Secondary | ICD-10-CM | POA: Diagnosis not present

## 2018-04-02 DIAGNOSIS — Z905 Acquired absence of kidney: Secondary | ICD-10-CM | POA: Diagnosis not present

## 2018-04-02 DIAGNOSIS — N186 End stage renal disease: Secondary | ICD-10-CM | POA: Diagnosis not present

## 2018-04-02 DIAGNOSIS — I5023 Acute on chronic systolic (congestive) heart failure: Secondary | ICD-10-CM | POA: Diagnosis present

## 2018-04-02 DIAGNOSIS — K219 Gastro-esophageal reflux disease without esophagitis: Secondary | ICD-10-CM | POA: Diagnosis present

## 2018-04-02 DIAGNOSIS — S065X9A Traumatic subdural hemorrhage with loss of consciousness of unspecified duration, initial encounter: Secondary | ICD-10-CM | POA: Diagnosis not present

## 2018-04-02 DIAGNOSIS — R7989 Other specified abnormal findings of blood chemistry: Secondary | ICD-10-CM | POA: Diagnosis not present

## 2018-04-02 DIAGNOSIS — E8889 Other specified metabolic disorders: Secondary | ICD-10-CM | POA: Diagnosis present

## 2018-04-02 DIAGNOSIS — L298 Other pruritus: Secondary | ICD-10-CM | POA: Diagnosis not present

## 2018-04-02 DIAGNOSIS — T148XXA Other injury of unspecified body region, initial encounter: Secondary | ICD-10-CM | POA: Diagnosis not present

## 2018-04-02 DIAGNOSIS — T1490XA Injury, unspecified, initial encounter: Secondary | ICD-10-CM | POA: Diagnosis not present

## 2018-04-02 DIAGNOSIS — D631 Anemia in chronic kidney disease: Secondary | ICD-10-CM | POA: Diagnosis not present

## 2018-04-02 DIAGNOSIS — Z86711 Personal history of pulmonary embolism: Secondary | ICD-10-CM | POA: Diagnosis not present

## 2018-04-02 DIAGNOSIS — R58 Hemorrhage, not elsewhere classified: Secondary | ICD-10-CM | POA: Diagnosis not present

## 2018-04-02 DIAGNOSIS — Z86718 Personal history of other venous thrombosis and embolism: Secondary | ICD-10-CM | POA: Diagnosis not present

## 2018-04-02 DIAGNOSIS — W19XXXA Unspecified fall, initial encounter: Secondary | ICD-10-CM | POA: Diagnosis not present

## 2018-04-02 DIAGNOSIS — R55 Syncope and collapse: Secondary | ICD-10-CM | POA: Diagnosis not present

## 2018-04-02 DIAGNOSIS — S065XAA Traumatic subdural hemorrhage with loss of consciousness status unknown, initial encounter: Secondary | ICD-10-CM | POA: Diagnosis present

## 2018-04-02 DIAGNOSIS — I120 Hypertensive chronic kidney disease with stage 5 chronic kidney disease or end stage renal disease: Secondary | ICD-10-CM | POA: Diagnosis not present

## 2018-04-02 DIAGNOSIS — D539 Nutritional anemia, unspecified: Secondary | ICD-10-CM | POA: Diagnosis not present

## 2018-04-02 DIAGNOSIS — D638 Anemia in other chronic diseases classified elsewhere: Secondary | ICD-10-CM | POA: Diagnosis present

## 2018-04-02 DIAGNOSIS — I509 Heart failure, unspecified: Secondary | ICD-10-CM | POA: Diagnosis not present

## 2018-04-02 DIAGNOSIS — R778 Other specified abnormalities of plasma proteins: Secondary | ICD-10-CM

## 2018-04-02 DIAGNOSIS — S0990XA Unspecified injury of head, initial encounter: Secondary | ICD-10-CM | POA: Diagnosis not present

## 2018-04-02 LAB — CBG MONITORING, ED: Glucose-Capillary: 88 mg/dL (ref 65–99)

## 2018-04-02 LAB — PROTIME-INR
INR: 1.04
PROTHROMBIN TIME: 13.5 s (ref 11.4–15.2)

## 2018-04-02 LAB — COMPREHENSIVE METABOLIC PANEL WITH GFR
ALT: 30 U/L (ref 17–63)
AST: 43 U/L — ABNORMAL HIGH (ref 15–41)
Albumin: 3.2 g/dL — ABNORMAL LOW (ref 3.5–5.0)
Alkaline Phosphatase: 67 U/L (ref 38–126)
Anion gap: 14 (ref 5–15)
BUN: 29 mg/dL — ABNORMAL HIGH (ref 6–20)
CO2: 25 mmol/L (ref 22–32)
Calcium: 10.2 mg/dL (ref 8.9–10.3)
Chloride: 96 mmol/L — ABNORMAL LOW (ref 101–111)
Creatinine, Ser: 6.53 mg/dL — ABNORMAL HIGH (ref 0.61–1.24)
GFR calc Af Amer: 11 mL/min — ABNORMAL LOW (ref >60)
GFR calc non Af Amer: 9 mL/min — ABNORMAL LOW (ref >60)
Glucose, Bld: 87 mg/dL (ref 65–99)
Potassium: 3.6 mmol/L (ref 3.5–5.1)
Sodium: 135 mmol/L (ref 135–145)
Total Bilirubin: 0.7 mg/dL (ref 0.3–1.2)
Total Protein: 6 g/dL — ABNORMAL LOW (ref 6.5–8.1)

## 2018-04-02 LAB — CBC
HEMATOCRIT: 32 % — AB (ref 39.0–52.0)
Hemoglobin: 10 g/dL — ABNORMAL LOW (ref 13.0–17.0)
MCH: 32.4 pg (ref 26.0–34.0)
MCHC: 31.3 g/dL (ref 30.0–36.0)
MCV: 103.6 fL — AB (ref 78.0–100.0)
Platelets: 150 10*3/uL (ref 150–400)
RBC: 3.09 MIL/uL — ABNORMAL LOW (ref 4.22–5.81)
RDW: 15.3 % (ref 11.5–15.5)
WBC: 6 10*3/uL (ref 4.0–10.5)

## 2018-04-02 LAB — BRAIN NATRIURETIC PEPTIDE: B Natriuretic Peptide: >4500 pg/mL — ABNORMAL HIGH (ref 0.0–100.0)

## 2018-04-02 LAB — I-STAT TROPONIN, ED
TROPONIN I, POC: 0.89 ng/mL — AB (ref 0.00–0.08)
Troponin i, poc: 1 ng/mL (ref 0.00–0.08)

## 2018-04-02 LAB — TROPONIN I
TROPONIN I: 1.05 ng/mL — AB (ref <0.03)
Troponin I: 0.81 ng/mL (ref <0.03)

## 2018-04-02 MED ORDER — EMTRICITABINE-TENOFOVIR AF 200-25 MG PO TABS
1.0000 | ORAL_TABLET | Freq: Every day | ORAL | Status: DC
  Administered 2018-04-02 – 2018-04-04 (×3): 1 via ORAL
  Filled 2018-04-02 (×3): qty 1

## 2018-04-02 MED ORDER — CALCIUM ACETATE (PHOS BINDER) 667 MG PO CAPS: 667.0000 mg | ORAL_CAPSULE | Freq: Every day | ORAL | Status: DC

## 2018-04-02 MED ORDER — IOPAMIDOL (ISOVUE-370) INJECTION 76%
100.0000 mL | Freq: Once | INTRAVENOUS | Status: AC
  Administered 2018-04-02: 100 mL via INTRAVENOUS

## 2018-04-02 MED ORDER — OXYCODONE-ACETAMINOPHEN 5-325 MG PO TABS
2.0000 | ORAL_TABLET | Freq: Once | ORAL | Status: AC
  Administered 2018-04-02: 2 via ORAL
  Filled 2018-04-02: qty 2

## 2018-04-02 MED ORDER — OXYCODONE-ACETAMINOPHEN 5-325 MG PO TABS: 1.0000 | ORAL_TABLET | Freq: Three times a day (TID) | ORAL | Status: DC | PRN

## 2018-04-02 MED ORDER — DOLUTEGRAVIR SODIUM 50 MG PO TABS: 50.0000 mg | ORAL_TABLET | Freq: Every day | ORAL | Status: DC

## 2018-04-02 MED ORDER — OXYCODONE-ACETAMINOPHEN 5-325 MG PO TABS
2.0000 | ORAL_TABLET | Freq: Three times a day (TID) | ORAL | Status: DC | PRN
  Administered 2018-04-02 – 2018-04-04 (×5): 2 via ORAL
  Filled 2018-04-02 (×4): qty 2

## 2018-04-02 MED ORDER — EMTRICITABINE-TENOFOVIR AF 200-25 MG PO TABS: 1.0000 | ORAL_TABLET | Freq: Every day | ORAL | Status: DC

## 2018-04-02 MED ORDER — IOPAMIDOL (ISOVUE-370) INJECTION 76%
INTRAVENOUS | Status: AC
  Filled 2018-04-02: qty 100

## 2018-04-02 MED ORDER — CAMPHOR-MENTHOL 0.5-0.5 % EX LOTN
TOPICAL_LOTION | Freq: Two times a day (BID) | CUTANEOUS | Status: DC
  Administered 2018-04-02: 1 via TOPICAL
  Administered 2018-04-03: 11:00:00 via TOPICAL
  Administered 2018-04-03: 1 via TOPICAL
  Administered 2018-04-04: 10:00:00 via TOPICAL
  Filled 2018-04-02: qty 222

## 2018-04-02 MED ORDER — FLUTICASONE PROPIONATE 50 MCG/ACT NA SUSP
1.0000 | Freq: Every day | NASAL | Status: DC
  Administered 2018-04-03 – 2018-04-04 (×2): 1 via NASAL
  Filled 2018-04-02: qty 16

## 2018-04-02 MED ORDER — CALCIUM ACETATE (PHOS BINDER) 667 MG PO CAPS: 667.0000 mg | ORAL_CAPSULE | Freq: Three times a day (TID) | ORAL | Status: DC | PRN

## 2018-04-02 MED ORDER — ROPINIROLE HCL 0.25 MG PO TABS
0.2500 mg | ORAL_TABLET | Freq: Every day | ORAL | Status: DC
  Administered 2018-04-02 – 2018-04-03 (×2): 0.25 mg via ORAL
  Filled 2018-04-02 (×3): qty 1

## 2018-04-02 MED ORDER — GABAPENTIN 100 MG PO CAPS
100.0000 mg | ORAL_CAPSULE | Freq: Every day | ORAL | Status: DC
  Administered 2018-04-03 – 2018-04-04 (×2): 100 mg via ORAL
  Filled 2018-04-02 (×2): qty 1

## 2018-04-02 MED ORDER — RAMELTEON 8 MG PO TABS
8.0000 mg | ORAL_TABLET | Freq: Every day | ORAL | Status: DC | PRN
  Administered 2018-04-02 – 2018-04-03 (×2): 8 mg via ORAL
  Filled 2018-04-02 (×3): qty 1

## 2018-04-02 MED ORDER — NICOTINE 7 MG/24HR TD PT24
7.0000 mg | MEDICATED_PATCH | Freq: Every day | TRANSDERMAL | Status: DC
  Administered 2018-04-02 – 2018-04-04 (×3): 7 mg via TRANSDERMAL
  Filled 2018-04-02 (×3): qty 1

## 2018-04-02 MED ORDER — ACETAMINOPHEN 650 MG RE SUPP: 650.0000 mg | Freq: Four times a day (QID) | RECTAL | Status: DC | PRN

## 2018-04-02 MED ORDER — ACETAMINOPHEN 325 MG PO TABS
650.0000 mg | ORAL_TABLET | Freq: Four times a day (QID) | ORAL | Status: DC | PRN
  Administered 2018-04-04: 650 mg via ORAL

## 2018-04-02 MED ORDER — OXYCODONE HCL 5 MG PO TABS: 5.0000 mg | ORAL_TABLET | Freq: Three times a day (TID) | ORAL | Status: DC | PRN

## 2018-04-02 MED ORDER — DOLUTEGRAVIR SODIUM 50 MG PO TABS
50.0000 mg | ORAL_TABLET | Freq: Every day | ORAL | Status: DC
Start: 2018-04-02 — End: 2018-04-04
  Administered 2018-04-02 – 2018-04-04 (×3): 50 mg via ORAL
  Filled 2018-04-02 (×4): qty 1

## 2018-04-02 MED ORDER — OXYCODONE-ACETAMINOPHEN 10-325 MG PO TABS: 1.0000 | ORAL_TABLET | Freq: Three times a day (TID) | ORAL | Status: DC | PRN

## 2018-04-02 NOTE — Telephone Encounter (Signed)
Called [facility], verified scriipt on hold, will fill today and notify pt
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
## 2018-04-02 NOTE — ED Triage Notes (Addendum)
Pt arrived via [facility] after having syncope episode walking to his car and then woke up on the ground.  Pt is scheduled for a sleep study after having a possible episode of sleep walking last week where he fell and broke collar bone and injured HD graft.  Pt complains of headache chronic, and arm and chest pain.  EMS reports the patient has contusions to chest.  Both arms are restricted for use. Pt received 324mg  of asa and is on 02/4L.  cbg 102.  Pt has been coughing up blood since his last fall 1 week ago.  Pt reports he has burning to chest with coughing

## 2018-04-02 NOTE — ED Provider Notes (Signed)
Medical screening examination/treatment/procedure(s) were conducted as a shared visit with non-physician practitioner(s) and myself.  I personally evaluated the patient during the encounter. Briefly, the patient is a 42 yo M with extensive PMHx including DVT/PE, ESRD, HIV here with syncopal episode. Unclear etiology with no known precipitating factors. H/o sleep-related falls but was not asleep at this time. On exam, pt tachycardic, mild increased WOB with wheezing. Concern for PNA/pulmonary contusions, occult rib fx, also must consider DVT/PE given tachycardia and h/o same, off coumadin 2/2 SDH. Will likely need admission. Labs, imaging pending.   EKG Interpretation  Date/Time:  Thursday April 02 2018 06:29:09 EDT Ventricular Rate:  117 PR Interval:  136 QRS Duration: 112 QT Interval:  332 QTC Calculation: 463 R Axis:   -4 Text Interpretation:  Sinus tachycardia with occasional Premature ventricular complexes Possible Left atrial enlargement Left ventricular hypertrophy with repolarization abnormality inferior lateral ischemia loss of R waves Confirmed by Randal Buba, April ([id number]) on 04/02/2018 6:40:44 AM          Duffy Bruce, MD 04/02/18 [phone]

## 2018-04-02 NOTE — Telephone Encounter (Signed)
Patient is requesting a refill on pain medicine

## 2018-04-02 NOTE — Progress Notes (Signed)
Patient arrived to [facility] from Emergency department. Patient alert and oriented. CCMD notified. Admission assessment complete. Education initiated. No com[alints at this time. Will continue to monitor patient.

## 2018-04-02 NOTE — Progress Notes (Addendum)
CRITICAL VALUE ALERT  Critical Value:  Troponin 0.81  Date & Time Notied:  04/02/2018 0735  Provider Notified: Teaching service Resident  Orders Received/Actions taken: No new orders at this time.

## 2018-04-03 ENCOUNTER — Inpatient Hospital Stay (HOSPITAL_COMMUNITY): Payer: Medicare Other

## 2018-04-03 DIAGNOSIS — Z86718 Personal history of other venous thrombosis and embolism: Secondary | ICD-10-CM

## 2018-04-03 DIAGNOSIS — W19XXXA Unspecified fall, initial encounter: Secondary | ICD-10-CM

## 2018-04-03 DIAGNOSIS — Z86711 Personal history of pulmonary embolism: Secondary | ICD-10-CM

## 2018-04-03 DIAGNOSIS — G4700 Insomnia, unspecified: Secondary | ICD-10-CM

## 2018-04-03 DIAGNOSIS — Z720 Tobacco use: Secondary | ICD-10-CM

## 2018-04-03 DIAGNOSIS — G629 Polyneuropathy, unspecified: Secondary | ICD-10-CM

## 2018-04-03 DIAGNOSIS — S065X9A Traumatic subdural hemorrhage with loss of consciousness of unspecified duration, initial encounter: Secondary | ICD-10-CM

## 2018-04-03 DIAGNOSIS — I509 Heart failure, unspecified: Secondary | ICD-10-CM

## 2018-04-03 DIAGNOSIS — R55 Syncope and collapse: Secondary | ICD-10-CM

## 2018-04-03 DIAGNOSIS — Z79899 Other long term (current) drug therapy: Secondary | ICD-10-CM

## 2018-04-03 DIAGNOSIS — Z8781 Personal history of (healed) traumatic fracture: Secondary | ICD-10-CM

## 2018-04-03 DIAGNOSIS — L298 Other pruritus: Secondary | ICD-10-CM

## 2018-04-03 DIAGNOSIS — I132 Hypertensive heart and chronic kidney disease with heart failure and with stage 5 chronic kidney disease, or end stage renal disease: Principal | ICD-10-CM

## 2018-04-03 DIAGNOSIS — Z992 Dependence on renal dialysis: Secondary | ICD-10-CM

## 2018-04-03 DIAGNOSIS — R05 Cough: Secondary | ICD-10-CM

## 2018-04-03 DIAGNOSIS — R58 Hemorrhage, not elsewhere classified: Secondary | ICD-10-CM

## 2018-04-03 DIAGNOSIS — G2581 Restless legs syndrome: Secondary | ICD-10-CM

## 2018-04-03 DIAGNOSIS — B20 Human immunodeficiency virus [HIV] disease: Secondary | ICD-10-CM

## 2018-04-03 DIAGNOSIS — D539 Nutritional anemia, unspecified: Secondary | ICD-10-CM

## 2018-04-03 DIAGNOSIS — N186 End stage renal disease: Secondary | ICD-10-CM

## 2018-04-03 DIAGNOSIS — Z79891 Long term (current) use of opiate analgesic: Secondary | ICD-10-CM

## 2018-04-03 DIAGNOSIS — J449 Chronic obstructive pulmonary disease, unspecified: Secondary | ICD-10-CM

## 2018-04-03 DIAGNOSIS — I712 Thoracic aortic aneurysm, without rupture: Secondary | ICD-10-CM

## 2018-04-03 LAB — RENAL FUNCTION PANEL
Albumin: 3.2 g/dL — ABNORMAL LOW (ref 3.5–5.0)
Anion gap: 18 — ABNORMAL HIGH (ref 5–15)
BUN: 50 mg/dL — ABNORMAL HIGH (ref 6–20)
CO2: 21 mmol/L — ABNORMAL LOW (ref 22–32)
Calcium: 10 mg/dL (ref 8.9–10.3)
Chloride: 93 mmol/L — ABNORMAL LOW (ref 101–111)
Creatinine, Ser: 8.55 mg/dL — ABNORMAL HIGH (ref 0.61–1.24)
GFR calc Af Amer: 8 mL/min — ABNORMAL LOW (ref >60)
GFR calc non Af Amer: 7 mL/min — ABNORMAL LOW (ref >60)
Glucose, Bld: 95 mg/dL (ref 65–99)
Phosphorus: 5.2 mg/dL — ABNORMAL HIGH (ref 2.5–4.6)
Potassium: 4.1 mmol/L (ref 3.5–5.1)
Sodium: 132 mmol/L — ABNORMAL LOW (ref 135–145)

## 2018-04-03 LAB — TROPONIN I: Troponin I: 0.79 ng/mL (ref <0.03)

## 2018-04-03 LAB — VITAMIN B12: Vitamin B-12: 456 pg/mL (ref 180–914)

## 2018-04-03 MED ORDER — LACTULOSE 10 GM/15ML PO SOLN: 20.0000 g | Freq: Every day | ORAL | Status: DC | PRN

## 2018-04-03 NOTE — Progress Notes (Signed)
Subjective: Brian [name] reports the burning chest pain associated with cough has improved since admission. He has not had any significant lightheadedness or dizziness today. His girlfriend reports he tried to get up several times throughout the night but did not have any falls. He also reports his rash felt less itchy after using Sarna lotion. He has eaten and taken in fluids without difficulty and had a bowel movement this morning.   Objective:  Vital signs in last 24 hours: Vitals:   04/02/18 1946 04/03/18 0054 04/03/18 0458 04/03/18 1116  BP:  (!) 144/78 (!) 150/66 (!) 189/121  Pulse:  (!) 110 (!) 106 (!) 121  Resp:  18 20   Temp:  99.2 F (37.3 C) 98.9 F (37.2 C) (!) 97.3 F (36.3 C)  TempSrc:    Oral  SpO2:  92% 97%   Weight: 91.9 kg (202 lb 9.6 oz)  91.5 kg (201 lb 11.2 oz)   Height: 6\' 2"  (1.88 m)      Physical Exam: General Appearance: Well developed, chronically ill-appearing, pleasant sitting upright on side of bed Skin: Inspection of the skin reveals scattered excoriations in various stages of healing; diffuse ecchymoses in right chest and right upper back Lungs: Intermittent cough, normal WOB, diffuse crackles and inspiratory wheezing.  Cardiovascular: Tachycardic, regular, heart sounds masked significantly by bruit of left arm fistula. Peripheral pulses 2+ and symmetric.  Pertinent Labs and Imaging: Trop 1.05 --> 0.81 --> 0.79 BNP >4500 CTPE negative  Assessment/Plan:  Principal Problem:   Syncope Active Problems:   Human immunodeficiency virus (HIV) disease ([place])   ESRD (end stage renal disease) ([place])   Insomnia   Restless leg syndrome   Subdural hematoma (HCC)  Brian [name] is a 42 y.o. 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  Syncope: At  this time, favor cardiac etiology, namely fluid overload and demand ischemia vs arrhythmia. At high risk for atrial and ventricular arrhythmias with EF 25-30%. May also consider orthostasis as pt takes several centrally-acting medications that could cause this (gabapentin, ropinirole, Percocet, Valium). The differential includes seizure given recent head trauma and SDH. No significant electrolyte deviations from baseline. PE ruled out by CT angio.  It is encouraging to see his symptoms resolving, trop improving and no concerning tracings on telemetry. Plan is for dialysis today and tomorrow to return to euvolemic state and reassess EDW. - limit centrally-acting medications - d/c Valium - bed alarm when asleep - HD today and tomorrow per nephrology - cont telemetry - repeat echocardiogram to evaluate for structural changes  CHF: EF 20-25% (11/2017). Recent worsening SOB, orthopnea and cough raises suspicion for worsening cardiac function, volume reduction with HD today and tomorrow - echocardiogram  Productive cough: No persistent blood-tinged sputum. At this time, favor viral URI vs atypical PNA. DDx includes lung contusion given recent rib fracture, though no signs of contusion on CXR. Pt has history of COPD and uses albuterol inhaler at home but recently ran out. - if cough worsens or develops oxygen requirement, repeat CXR  - new Rx for albuterol inhaler on discharge  Generalized pruritic rash: Consistent with uremic pruritis. Differential includes chronic infestation, though no interdigital burrows to suggest scabies. Distribution and absence of pain make calciphylaxis unlikely. Improved with Sarna lotion. - Continue Sarna  ESRD  on HD MWF: Electrolytes not significantly deviated from baseline. HD today and tomorrow per nephrology to decrease fluid overload.  - continue calcium acetate - continue Percocet  Macrocytic anemia: B12 normal. Likely AOCD in conjunction with chronic etOH use given  increased AST.  - follow CBC  HTN: Hypertensive during admission, not reaching severe ranges.  - Reassess following HD today and tomorrow  HIV: Controlled with antivirals, CD4 480, viral load < 20 - cont Descovy and T[place]ay  Restless leg syndrome: Recently switched from pramipexole to ropinirole.  - continue ropinirole   Peripheral neuropathy:  - continue gabapentin  Insomnia: Though he was recently prescribed Valium, this is an inappropriate sleep aid and increases the risk of falls. - discontinue Valium - start Ramelteon  Tobacco use:  - nicotine patch  DVT PPX: SCDs  FEN/GI: F: pt tolerating diet E: HD 4/19 N: Low protein diet GI: calcium acetate and home prn lactulose (generlac)  Dispo: Anticipated discharge in approximately 1-2 day(s).   Sarina Ser, Medical Student 04/03/2018, 11:46 AM Pager: [phone]  Attestation for Student Documentation:  I personally was present and performed or re-performed the history, physical exam and medical decision-making activities of this service and have verified that the service and findings are accurately documented in the student's note. I have edited the above note as necessary to reflect my findings, assessment, and plan.  Oda Kilts, MD 04/03/2018, 5:25 PM
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
## 2018-04-03 NOTE — Procedures (Signed)
   I was present at this dialysis session, have reviewed the session itself and made  appropriate changes Kelly Splinter MD [place] pager [phone]   04/03/2018, 4:37 PM

## 2018-04-03 NOTE — Discharge Summary (Addendum)
Name: Brian [name] MRN: [id number] DOB: 1976/02/15 42 y.o. PCP: Ledell Noss, MD  Date of Admission: 04/02/2018  5:55 AM Date of Discharge: 04/04/2018 Attending Physician: Lenice Pressman MD   Discharge Diagnosis: Principal Problem:   Syncope Active Problems:   Human immunodeficiency virus (HIV) disease ([place])   ESRD (end stage renal disease) ([place])   Insomnia   Restless leg syndrome   Subdural hematoma ([place])   Discharge Medications: Allergies as of 04/04/2018      Reactions   Cat Hair Extract Other (See Comments)   SNEEZING      Medication List    STOP taking these medications   diazepam 5 MG tablet Commonly known as:  VALIUM   neomycin-bacitracin-polymyxin ointment Commonly known as:  NEOSPORIN   nicotine 21 mg/24hr patch Commonly known as:  NICODERM CQ - dosed in mg/24 hours   ondansetron 4 MG disintegrating tablet Commonly known as:  ZOFRAN ODT   pramipexole 0.25 MG tablet Commonly known as:  MIRAPEX     TAKE these medications   albuterol 108 (90 Base) MCG/ACT inhaler Commonly known as:  PROAIR HFA Inhale 2 puffs into the lungs every 6 (six) hours as needed for wheezing or shortness of breath. What changed:  See the new instructions.   amLODipine 10 MG tablet Commonly known as:  NORVASC Take 10 mg by mouth at bedtime.   buPROPion 100 MG tablet Commonly known as:  WELLBUTRIN Take 1 tablet (100 mg total) by mouth daily.   calcium acetate 667 MG capsule Commonly known as:  PHOSLO Take 667 mg by mouth See admin instructions. Take 667 mg by mouth daily with biggest meal and take 667 mg by mouth with snacks   camphor-menthol lotion Commonly known as:  SARNA Apply topically 2 (two) times daily.   DESCOVY 200-25 MG tablet Generic drug:  emtricitabine-tenofovir AF TAKE 1 TABLET BY MOUTH DAILY   dronabinol 5 MG capsule Commonly known as:  MARINOL Take 1 capsule (5 mg total) by mouth 2 (two) times daily before lunch and supper. Do not fill before 30  days from prior   fluticasone 50 MCG/ACT nasal spray Commonly known as:  FLONASE USE 2 SPRAYS IN EACH NOSTRIL DAILY   gabapentin 100 MG capsule Commonly known as:  NEURONTIN Take 1 capsule (100 mg total) by mouth daily. What changed:    medication strength  how much to take  how to take this  when to take this  additional instructions   GENERLAC 10 GM/15ML Soln Generic drug:  lactulose (encephalopathy) Take 20 g by mouth daily.   neomycin-polymyxin b-dexamethasone 3.5-10000-0.1 Susp Commonly known as:  MAXITROL Place 3 drops into both eyes daily.   oxyCODONE-acetaminophen 10-325 MG tablet Commonly known as:  PERCOCET Take 1 tablet by mouth every 8 (eight) hours as needed for up to 90 doses for pain. Do not fill before 30 days from prior   rOPINIRole 0.25 MG tablet Commonly known as:  REQUIP Take 0.25 mg by mouth at bedtime.   TIVICAY 50 MG tablet Generic drug:  dolutegravir TAKE 1 TABLET BY MOUTH DAILY       Disposition and follow-up:   Brian [name] was discharged from [facility] in Stable condition.  At the hospital follow up visit please address:  1.  Syncope:  - limit centrally-acting medications (ropinirole, Percocet, gabapentin) - Valium d/ced in hospital - follow-up polysomnography (scheduled for 04/10/18)  Productive cough: - consider PFTs and inhaler adjustments (currently just on prn  albuterol)  Generalized pruritic rash: - continue emollients  CHF: - F/u with cards for medical management vs ICD  ESRD on HD MWF: - continue MWF schedule with updated EDW 87 kg (192 lbs)  Hypertension: - reevaluate following stabilization with new EDW - consider ACE/ARB given ESRD and CHF   Insomnia: - manage pruritus, restless leg syndrome  - encourage regular sleep schedule  R clavicular fracture: - encourage use of sling - follow up with orthopedics  2.  Labs / imaging needed at time of follow-up: none  3.  Pending labs/ test  needing follow-up: Echocardiogram - follow up with cardiology  Follow-up Appointments: Follow-up Information    Ledell Noss, MD Follow up.   Specialty:  Internal Medicine Contact information: [place] [address] [phone]           Hospital Course by problem list: Principal Problem:   Syncope Active Problems:   Human immunodeficiency virus (HIV) disease ([place])   ESRD (end stage renal disease) ([place])   Insomnia   Restless leg syndrome   Subdural hematoma ([facility])   Brian [name] is a 42 y.o. 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  1. Syncope: Brian [name] reportedly lost consciousness while accessing his car and was awoken by a neighbor. Prior to fall, he had been feeling increasingly SOB on exertion with orthopnea and productive cough. He had no residual weakness, confusion or focal neurological deficits on PE. On arrival to ED, Troponin 1.05, subsequently downtrended. BNP>4500. No significant EKG changes, CT angio negative, no significant deviation from baseline electrolytes. Pt at baseline so EEG was deemed low yield and not performed. This syncopal event is believed to be secondary to polypharmacy with centrally-acting agents (Percocet, Valium, ropinirole, gabapentin) in the setting of alcohol consumption (4 shots several hours prior to fall). He remained at baseline throughout hospitalization. He was able to ambulate with no dizziness or lightheadedness.  CHF: Worsening SOB, orthopnea and cough x 3 weeks. Elevated pro-BNP, vascular congestion on CXR. Pt was dialyzed on 4/19 and 4/20 with fluid reduction. In consultation with nephrology, there is concern that his preexisting RUE AVF has persistently high flow (evidenced by loud bruit) leading to afterload  reduction and new onset high output heart failure. Echocardiogram was performed 4/20 to assess for structural changes and output changes as compared with 11/2017. Results showed severe LV dilation, EF 20-25%, severe L atrial dilation. He may need R heart catheterization at rest and with transient fistula occlusion for definitive diagnosis. Per nephrology, it is likely reasonable to ligate his RUE AVF if there is consensus it would improve his cardiac function. At time of discharge, orthopnea and shortness of breath were much improved. We recommend pt follow with cardiology outpatient for ongoing medical management and consideration of ICD placement.  Productive cough: Likely viral URI due to no WBC elevation, fever or consolidation on CXR. Resolving at time of discharge. Pt has COPD with no spirometry found in chart and uses albuterol inhaler prn at home but recently ran out. Would likely benefit from formal PFTs. Albuterol inhaler resumed at time of discharge.   Generalized pruritic rash: Scattered excoriations with no evidence of parasitic infestation, most consistent with uremic pruritis. No pain or necrotic lesions suggestive of calciphylaxis. Pt was started on Sarna lotion with improvement of  symptoms.   ESRD on HD MWF: Pt received extra day of dialysis 4/20 to remove fluid. At time of discharge, he was directed to continue HD MWF. EDW at time of d/c 87 kg (192 lbs).  Macrocytic anemia: Hgb 10 on admission (11.7 02/2018) with MCV 103. B12 WNL. 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   HTN: Pt was hypertensive (140s-150s/70s) during admission. His hypertension may be reevaluated outpatient once he is stably at EDW.   HIV: Pt follows with ID consistently, CD4 480, viral load < 20 (03/10/18). Home Tivicay and Descovy continued.  Restless leg syndrome: Continued home ropinirole.   Peripheral neuropathy: Continued home gabapentin.  Insomnia: Discontinued Valium, which  had recently been prescribed by outpatient provider. Pt received Ramelteon while admitted.   R clavicular fracture: Pt directed to continue using figure of 8 sling as directed by orthopedics.  Tobacco use: Nicotine patch administered during admission.   Discharge Vitals:   BP (!) 142/71   Pulse (!) 108   Temp (!) 97.4 F (36.3 C)   Resp 18   Ht 6\' 2"  (1.88 m)   Wt 192 lb 3.9 oz (87.2 kg)   SpO2 95%   BMI 24.68 kg/m   Pertinent Labs, Studies, and Procedures:  4/18 CXR: 1. Mild vascular congestion and borderline cardiomegaly. Increased interstitial markings raise concern for mild pulmonary edema. 2. No displaced rib fracture seen.  4/18 CT head: Stable size and appearance of right subdural hematoma described on prior exam. Stable minimal right to left midline shift is noted. No new intracranial abnormality is noted. Stable mild chronic ischemic white matter disease.  4/18 CT angio chest: - Cardiomegaly. Three-vessel coronary artery disease advanced for patient's age. - 4 cm ascending thoracic aortic aneurysm. This is stable. Recommend annual imaging followup by CTA or MRA. This recommendation follows 2010 ACCF/AHA/AATS/ACR/ASA/SCA/SCAI/SIR/STS/SVM Guidelines for the Diagnosis and Management of Patients with Thoracic Aortic Disease. Circulation. 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 - Dependent atelectasis in the lower lobes. - No evidence of pulmonary embolus.  4/18 EKG: stable inferolateral ischemia and ST depressions as compared with 02/2018  4/20 Echo:  - Left ventricle: The cavity size was severely dilated. Wall   thickness was increased in a pattern of mild LVH. Systolic   function was severely reduced. The estimated ejection fraction   was in the range of 20% to 25%. Diffuse hypokinesis. The study is   not technically sufficient to allow evaluation of LV diastolic   function. - Aortic valve:  Mildly calcified annulus. Trileaflet; mildly   thickened leaflets.  - Mitral valve: Mildly calcified annulus. Mildly thickened leaflets.  There was moderate regurgitation. - Left atrium: The atrium was severely dilated. - Right atrium: The atrium was moderately dilated. - Atrial septum: No defect or patent foramen ovale was identified.  Discharge Instructions: Discharge Instructions    Call MD for:   Complete by:  As directed    Call MD for:  difficulty breathing, headache or visual disturbances   Complete by:  As directed    Call MD for:  persistant dizziness or light-headedness   Complete by:  As directed    Call MD for:  persistant nausea and vomiting   Complete by:  As directed    Diet - low sodium heart healthy   Complete by:  As directed    Increase activity slowly   Complete by:  As directed  Signed: Miachel Roux MS4  04/13/2018, 11:20 AM   Pager: [phone]   Attestation for Student Documentation:  I personally was present and performed or re-performed the history, physical exam and medical decision-making activities of this service and have verified that the service and findings are accurately documented in the student's note.  Ledell Noss, MD 04/13/2018, 11:21 AM   Internal Medicine Attending Note:  I saw and examined the patient on the day of discharge. I reviewed and agree with the discharge summary written by the house staff.  Oda Kilts, MD

## 2018-04-04 ENCOUNTER — Inpatient Hospital Stay (HOSPITAL_COMMUNITY): Payer: Medicare Other

## 2018-04-04 DIAGNOSIS — J3081 Allergic rhinitis due to animal (cat) (dog) hair and dander: Secondary | ICD-10-CM

## 2018-04-04 DIAGNOSIS — X58XXXD Exposure to other specified factors, subsequent encounter: Secondary | ICD-10-CM

## 2018-04-04 DIAGNOSIS — R0600 Dyspnea, unspecified: Secondary | ICD-10-CM

## 2018-04-04 DIAGNOSIS — S42001D Fracture of unspecified part of right clavicle, subsequent encounter for fracture with routine healing: Secondary | ICD-10-CM

## 2018-04-04 DIAGNOSIS — R55 Syncope and collapse: Secondary | ICD-10-CM

## 2018-04-04 LAB — CBC
HEMATOCRIT: 31.8 % — AB (ref 39.0–52.0)
HEMOGLOBIN: 10 g/dL — AB (ref 13.0–17.0)
MCH: 32.4 pg (ref 26.0–34.0)
MCHC: 31.4 g/dL (ref 30.0–36.0)
MCV: 102.9 fL — AB (ref 78.0–100.0)
Platelets: 141 10*3/uL — ABNORMAL LOW (ref 150–400)
RBC: 3.09 MIL/uL — ABNORMAL LOW (ref 4.22–5.81)
RDW: 15.6 % — AB (ref 11.5–15.5)
WBC: 4.7 10*3/uL (ref 4.0–10.5)

## 2018-04-04 LAB — RENAL FUNCTION PANEL
ANION GAP: 13 (ref 5–15)
Albumin: 3.2 g/dL — ABNORMAL LOW (ref 3.5–5.0)
BUN: 34 mg/dL — AB (ref 6–20)
CHLORIDE: 92 mmol/L — AB (ref 101–111)
CO2: 26 mmol/L (ref 22–32)
Calcium: 9.3 mg/dL (ref 8.9–10.3)
Creatinine, Ser: 6.72 mg/dL — ABNORMAL HIGH (ref 0.61–1.24)
GFR calc Af Amer: 10 mL/min — ABNORMAL LOW (ref >60)
GFR calc non Af Amer: 9 mL/min — ABNORMAL LOW (ref >60)
GLUCOSE: 145 mg/dL — AB (ref 65–99)
Phosphorus: 4.2 mg/dL (ref 2.5–4.6)
Potassium: 3.8 mmol/L (ref 3.5–5.1)
Sodium: 131 mmol/L — ABNORMAL LOW (ref 135–145)

## 2018-04-04 LAB — ECHOCARDIOGRAM LIMITED
Height: 74 in
Weight: 3097.6 oz

## 2018-04-04 MED ORDER — ACETAMINOPHEN 325 MG PO TABS
ORAL_TABLET | ORAL | Status: AC
  Administered 2018-04-04: 650 mg via ORAL
  Filled 2018-04-04: qty 2

## 2018-04-04 MED ORDER — LIDOCAINE-PRILOCAINE 2.5-2.5 % EX CREA
1.0000 "application " | TOPICAL_CREAM | CUTANEOUS | Status: DC | PRN
  Filled 2018-04-04: qty 5

## 2018-04-04 MED ORDER — SODIUM CHLORIDE 0.9 % IV SOLN: 100.0000 mL | INTRAVENOUS | Status: DC | PRN

## 2018-04-04 MED ORDER — ALBUTEROL SULFATE HFA 108 (90 BASE) MCG/ACT IN AERS: 2.0000 | INHALATION_SPRAY | Freq: Four times a day (QID) | RESPIRATORY_TRACT | 11 refills | Status: DC | PRN

## 2018-04-04 MED ORDER — OXYCODONE HCL 5 MG PO TABS
10.0000 mg | ORAL_TABLET | Freq: Once | ORAL | Status: AC
  Administered 2018-04-04: 10 mg via ORAL

## 2018-04-04 MED ORDER — PENTAFLUOROPROP-TETRAFLUOROETH EX AERO: 1.0000 "application " | INHALATION_SPRAY | CUTANEOUS | Status: DC | PRN

## 2018-04-04 MED ORDER — OXYCODONE HCL 5 MG PO TABS
ORAL_TABLET | ORAL | Status: AC
  Administered 2018-04-04: 10 mg via ORAL
  Filled 2018-04-04: qty 2

## 2018-04-04 MED ORDER — CAMPHOR-MENTHOL 0.5-0.5 % EX LOTN: TOPICAL_LOTION | Freq: Two times a day (BID) | CUTANEOUS | 0 refills | Status: DC

## 2018-04-04 MED ORDER — GABAPENTIN 100 MG PO CAPS: 100.0000 mg | ORAL_CAPSULE | Freq: Every day | ORAL | 3 refills | Status: DC

## 2018-04-04 MED ORDER — HEPARIN SODIUM (PORCINE) 1000 UNIT/ML DIALYSIS
2600.0000 [IU] | Freq: Once | INTRAMUSCULAR | Status: AC
  Administered 2018-04-04: 2600 [IU] via INTRAVENOUS_CENTRAL

## 2018-04-04 MED ORDER — OXYCODONE-ACETAMINOPHEN 5-325 MG PO TABS
ORAL_TABLET | ORAL | Status: AC
  Administered 2018-04-04: 2 via ORAL
  Filled 2018-04-04: qty 2

## 2018-04-04 NOTE — Progress Notes (Signed)
Patient arrived to unit per bed.  Reviewed treatment plan and this RN agrees.  Report received from bedside RN, Cristin.  Consent verified.  Patient A & O X 4. Lung sounds diminished to ausculation in all fields. No edema. Cardiac: ST.  Prepped LLAVF with alcohol and cannulated with two 15 gauge needles.  Pulsation of blood noted.  Flushed access well with saline per protocol.  Connected and secured lines and initiated tx at 1506.  UF goal of 5500 mL and net fluid removal of 5000 mL.  Will continue to monitor.

## 2018-04-04 NOTE — Progress Notes (Signed)
Dialysis treatment completed.  3500 mL ultrafiltrated and net fluid removal 3000 mL.    Patient status unchanged. Lung sounds diminished to ausculation in all fields. No edema. Cardiac: ST.  Disconnected lines and removed needles.  Pressure held for 10 minutes and band aid/gauze dressing applied.  Report given to bedside RN, Cristin.  Patient signed off AMA with 1 hour, six minutes remaining.  Signed off d/t ride situation.  AMA form signed in patients chart.

## 2018-04-04 NOTE — Discharge Instructions (Signed)
Brian [name], Brian [name] were hospitalized after falling and losing consciousness. We believe part of the reason you fell is the combination of medications you have been taking. Many of these, including ropinirole, gabapentin, Percocet and Valium, can make you unsteady on your feet. We understand that you have insomnia, but we believe Valium is not a good treatment choice for this. Instead, we recommend controlling some of the things that keep you up at night (itching, restless legs) and developing a consistent sleep schedule. We also recommend you undergo a sleep study for your previous falls. Your sleep study is scheduled for April 26.   We also found that you had excess fluid in your body, which can contribute to the shortness of breath you have been experiencing. We took fluid off of your body with extra hemodialysis. On your date of discharge, you weighed 192 pounds. This is a good target dry weight to try and continue as you go to your regular hemodialysis sessions.   We did an ultrasound of your heart and found that you have poor heart function that may be a little worse than your last test in December 2018. We would like you to follow up with a cardiologist to see if you need some medications to help your heart or possibly an internal defibrillator.   Please follow these instructions: - START taking gabapentin 100 mg three times daily - this is a new dose that is better for you based on your chronic kidney disease - START using Sarna lotion for your itchiness daily - STOP taking Valium because it can make you unsteady on your feet

## 2018-04-04 NOTE — Progress Notes (Addendum)
Subjective: Mr. Gentles reports his shortness of breath on exertion and orthopnea have improved since dialysis yesterday. He was able to ambulate Brian [name] without feeling too winded and denies dizziness or lightheadedness. He slept poorly and reports Brian [name] has not helped as much as Valium, which was discontinued. He continues to cough intermittently but denies any blood-streaked sputum. He has been eating and stooling normally. His itching is improved with Sarna lotion. He is comfortable with being discharged home today following dialysis but would not have a ride home, as his girlfriend works at [employer].   Objective:  Vital signs in last 24 hours: Vitals:   04/03/18 1813 04/03/18 2212 04/04/18 0058 04/04/18 0538  BP: 134/62 129/64 130/62 (!) 151/75  Pulse: (!) 101 (!) 103 (!) 109 (!) 105  Resp: 17   18  Temp: 97.8 F (36.6 C) 98.1 F (36.7 C) 97.9 F (36.6 C) (!) 97.3 F (36.3 C)  TempSrc: Oral Oral Oral Oral  SpO2: 97% 96% 94% 97%  Weight: 88.1 kg (194 lb 3.6 oz)   87.8 kg (193 lb 9.6 oz)  Height:       Physical Exam: General Appearance: Well developed, chronically ill-appearing, pleasant, fully clothed, standing in room ordering breakfast Skin: Inspection of Brian skin reveals scattered excoriations in various stages of healing on arms and trunk Lungs:Normal WOB, diminished crackles and end expiratory wheezing as compared with exam yesterday Cardiovascular: Tachycardic, regular rhythm, heart sounds over aortic area masked significantly by bruit of right arm fistula. Heart sounds more audible over pulmonic area than exam on 4/19. Peripheral pulses 2+ and symmetric. MSK: Visible deformity with tenderness of R distal clavicle. R shoulder ROM limited by pain. Active R shoulder abduction to ~100 degrees. L shoulder with full ROM, abduction to 180 degrees.   Pertinent Labs and Imaging: Trop 1.05 --> 0.81 --> 0.79 BNP >4500 CTPE negative Wt 92 kg (4/18 pre-dialysis) --> 87.8 kg (4/20  pre-dialysis)  Assessment/Plan:  Principal Problem:   Syncope Active Problems:   Human immunodeficiency virus (HIV) disease ([place])   ESRD (end stage renal disease) ([place])   Insomnia   Restless leg syndrome   Subdural hematoma ([facility])  Brian [name] is a 42 y.o. 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  Syncope: At this time, favor demand ischemia secondary to hypervolemia vs orthostasis from combination of centrally-acting medications (gabapentin, ropinirole, Percocet, Valium). He is also at high risk for atrial and ventricular arrhythmias with EF 25-30%. Brian differential includes seizure given recent head trauma and SDH. No significant electrolyte deviations from baseline. PE ruled out by CT angio. Mr. Deboer continues to improve symptomatically, with decreased orthopnea and SOB following 4L fluid removal during dialysis. Plan is to repeat dialysis today with reassessment of EDW.  - limit centrally-acting medications - d/c Valium - bed alarm when asleep - HD today  - d/c telemetry - repeat echocardiogram to evaluate for structural changes  CHF: EF 20-25% (11/2017). Recent worsening SOB, orthopnea and cough raises suspicion for worsening cardiac function. In consultation with nephrology, there is concern that his preexisting RUE AVF has persistently high flow (evinced by loud bruit) leading to afterload reduction and new onset high output heart failure. This would lead to catecholamine release and subsequent tachycardia, which Brian patient exhibits; however, his persistent  hypertension is inconsistent with a high output HF state. Echocardiogram is warranted to assess for structural changes and output changes as compared with 11/2017. If echo suspicious for high output failure, he may  also need R heart catheterization at rest and with transient fistula occlusion for definitive diagnosis. Per nephrology, pt is eligible for occlusion of RUE AVF, which would remedy excess afterload reduction. Nephrology is ok with discharge today and outpatient cardiology follow-up.  - volume reduction with HD  - follow up echocardiogram - referral to cardiology for assessment of CHF   Productive cough: No persistent blood-tinged sputum. At this time, favor viral URI vs atypical PNA. DDx includes lung contusion given recent rib fracture, though no signs of contusion on CXR. Pt has history of COPD and uses albuterol inhaler at home but recently ran out. - if cough worsens or develops oxygen requirement, repeat CXR  - new Rx for albuterol inhaler on discharge  Generalized pruritic rash: Consistent with uremic pruritis. Differential includes chronic infestation, though no interdigital burrows to suggest scabies. Distribution and absence of pain make calciphylaxis unlikely. Improved with Sarna lotion. - Continue Sarna  ESRD on HD MWF: Electrolytes not significantly deviated from baseline. HD today - continue calcium acetate - continue Percocet  Macrocytic anemia: B12 normal. 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  - follow CBC  HTN: Hypertensive during admission, not reaching severe ranges.  - Reassess following HD today  HIV: Controlled with antivirals, CD4 480, viral load < 20 - cont Descovy and Tivicay  Restless leg syndrome: Recently switched from pramipexole to ropinirole.  - continue ropinirole   Peripheral neuropathy:  - continue gabapentin  Insomnia: Though he was recently prescribed Valium, this is an inappropriate sleep aid and increases Brian risk of falls. His insomnia will likely improve with management of itching and restless legs as well as developing a consistent sleep schedule. Itching has improved with Sarna and restless legs are reportedly  controlled with ropinirole. - discontinue Valium - continue [name] - outpatient follow-up and polysomnography as scheduled  R clavicular fracture: Pt followed with orthopedics following fractre 4/7 and was directed to wear figure of 8 sling, which he brought with him to hospital. CXR 4/18 with evidence of comminuted fracture.  - continue using sling  Tobacco use:  - nicotine patch  DVT PPX: SCDs  FEN/GI: F: pt tolerating diet E: HD 4/20 N: Low protein diet GI: calcium acetate and home prn lactulose (generlac)  Dispo: Anticipated discharge in approximately 1 day.  Sarina Ser, Medical Student 04/04/2018, 8:04 AM Pager: [phone]  Attestation for Student Documentation:  I personally was present and performed or re-performed Brian history, physical exam and medical decision-making activities of this service and have verified that Brian service and findings are accurately documented in Brian student's note.  Ledell Noss, MD 04/04/2018, 5:51 PM

## 2018-04-04 NOTE — Progress Notes (Addendum)
HD called, report given  Pt refusing to go to HD, pt states his ride is on his way from [place] and he doesn't have time to go to hemo today  Educated pt and informed he still does not have discharge orders at this time Paged MD to inform  Awaiting call back  MD aware, called pt room phone and discussed with pt  [phone]

## 2018-04-04 NOTE — Progress Notes (Signed)
Called hemodialysis, spoke to [place], she stated pt is not on the schedule for HD today

## 2018-04-04 NOTE — Progress Notes (Signed)
Pt wants to leave AMA  Paged MD, MD aware, stated they are coming to bedside and will place discharge orders  Pt states he will only stay till 1800  MD aware

## 2018-04-04 NOTE — Progress Notes (Signed)
  Echocardiogram 2D Echocardiogram has been performed.  Starlina Lapre G Persephanie Laatsch 04/04/2018, 1:55 PM

## 2018-04-04 NOTE — Progress Notes (Signed)
[facility] Progress Note  Subjective: breathing is "much better', walked in the halls this am.  Net 4.5 L UF yest w HD. BP's stable.   Vitals:   04/03/18 2212 04/04/18 0058 04/04/18 0538 04/04/18 0855  BP: 129/64 130/62 (!) 151/75   Pulse: (!) 103 (!) 109 (!) 105 (!) (P) 103  Resp:   18 (P) 16  Temp: 98.1 F (36.7 C) 97.9 F (36.6 C) (!) 97.3 F (36.3 C)   TempSrc: Oral Oral Oral   SpO2: 96% 94% 97% (P) 100%  Weight:   87.8 kg (193 lb 9.6 oz)   Height:        Inpatient medications: . calcium acetate  667 mg Oral Q supper  . camphor-menthol   Topical BID  . dolutegravir  50 mg Oral Daily  . emtricitabine-tenofovir AF  1 tablet Oral Daily  . fluticasone  1 spray Each Nare Daily  . gabapentin  100 mg Oral Daily  . nicotine  7 mg Transdermal Daily  . rOPINIRole  0.25 mg Oral QHS    acetaminophen **OR** acetaminophen, calcium acetate, lactulose, oxyCODONE-acetaminophen, ramelteon  Exam: Alert, wdwn, no SOB No jvd Chest clear bilat RRR no MRG Abd soft ntnd no ascites Ext no LE edema Access - RUA +bruit over axilla/ distal R clavicle; left forearm +bruit NF, ox 3  Dialysis: MWF GOC 4h   89kg   2/2.5  P1   Hep 2600   R AVF Venofer 50mg  IV q week Calcitriol 2.25 PO TIW BMM: PhosLo 4 qac Sensipar 30       Impression: 1  Syncope/ falls - per primary team 2  Vol overload/ pulm edema / DOE - improving, 4.5 L off yest and is now under old dry wt. Likely possibilities would be lean body wt loss vs new heart failure (primary or secondary to high output w 2 active fistulas).  Have d/w primary team, planning cardiac w/u, possibly inpt vs outpatient.  3  ESRD HD MWF 4  HIV - on ART 5  Anemia ckd - Hb 10, stable 6  Vascular access - new L forearm AVF placed 08/2017 but not using due to low flows and cannulation issues.  Using old RUE AVF.  Has been referred for outpatient fistulogram.    Plan - extra HD today, max UF as tolerated and get dry wt down further. OK  from renal standpoint for dc after HD today, have d/w primary team.    Kelly Splinter MD [facility] pager [phone]   04/04/2018, 10:40 AM   Recent Labs  Lab 04/02/18 0722 04/03/18 0804  NA 135 132*  K 3.6 4.1  CL 96* 93*  CO2 25 21*  GLUCOSE 87 95  BUN 29* 50*  CREATININE 6.53* 8.55*  CALCIUM 10.2 10.0  PHOS  --  5.2*   Recent Labs  Lab 04/02/18 0722 04/03/18 0804  AST 43*  --   ALT 30  --   ALKPHOS 67  --   BILITOT 0.7  --   PROT 6.0*  --   ALBUMIN 3.2* 3.2*   Recent Labs  Lab 04/02/18 0722  WBC 6.0  HGB 10.0*  HCT 32.0*  MCV 103.6*  PLT 150   Iron/TIBC/Ferritin/ %Sat    Component Value Date/Time   IRON 14 (L) 07/29/2013 2310   TIBC 212 (L) 07/29/2013 2310   FERRITIN 784 (H) 07/29/2013 2310   IRONPCTSAT 7 (L) 07/29/2013 2310   IRONPCTSAT 10 (L) 07/06/2012 1654

## 2018-04-04 NOTE — Progress Notes (Signed)
Pt in low bed, pt does not want alarm set  Pt family at bedside Will continue to monitor

## 2018-04-04 NOTE — Progress Notes (Addendum)
Pt IV discontinued, catheter intact and telemetry removed. Pt has all belongings. Pt states he spoke to MD on the phone. Pt discharge education provided to pt at nurses station, due to pt stating he had to leave right away. Pt ambulated off the unit

## 2018-04-06 DIAGNOSIS — E875 Hyperkalemia: Secondary | ICD-10-CM | POA: Diagnosis not present

## 2018-04-06 DIAGNOSIS — N186 End stage renal disease: Secondary | ICD-10-CM | POA: Diagnosis not present

## 2018-04-06 DIAGNOSIS — D509 Iron deficiency anemia, unspecified: Secondary | ICD-10-CM | POA: Diagnosis not present

## 2018-04-06 DIAGNOSIS — D631 Anemia in chronic kidney disease: Secondary | ICD-10-CM | POA: Diagnosis not present

## 2018-04-06 DIAGNOSIS — N2581 Secondary hyperparathyroidism of renal origin: Secondary | ICD-10-CM | POA: Diagnosis not present
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
## 2018-04-08 DIAGNOSIS — D631 Anemia in chronic kidney disease: Secondary | ICD-10-CM | POA: Diagnosis not present

## 2018-04-08 DIAGNOSIS — E875 Hyperkalemia: Secondary | ICD-10-CM | POA: Diagnosis not present

## 2018-04-08 DIAGNOSIS — D509 Iron deficiency anemia, unspecified: Secondary | ICD-10-CM | POA: Diagnosis not present

## 2018-04-08 DIAGNOSIS — N186 End stage renal disease: Secondary | ICD-10-CM | POA: Diagnosis not present

## 2018-04-08 DIAGNOSIS — N2581 Secondary hyperparathyroidism of renal origin: Secondary | ICD-10-CM | POA: Diagnosis not present

## 2018-04-08 NOTE — Telephone Encounter (Signed)
Pt kept HFU on 03/19/2018 with the [place].
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
## 2018-04-10 ENCOUNTER — Ambulatory Visit (HOSPITAL_BASED_OUTPATIENT_CLINIC_OR_DEPARTMENT_OTHER): Payer: Medicare Other | Attending: Internal Medicine | Admitting: Internal Medicine

## 2018-04-10 VITALS — Ht 74.0 in | Wt 185.0 lb

## 2018-04-10 DIAGNOSIS — G44309 Post-traumatic headache, unspecified, not intractable: Secondary | ICD-10-CM

## 2018-04-10 DIAGNOSIS — G4733 Obstructive sleep apnea (adult) (pediatric): Secondary | ICD-10-CM | POA: Diagnosis not present

## 2018-04-10 DIAGNOSIS — N186 End stage renal disease: Secondary | ICD-10-CM | POA: Diagnosis not present

## 2018-04-10 DIAGNOSIS — G4731 Primary central sleep apnea: Secondary | ICD-10-CM | POA: Insufficient documentation

## 2018-04-10 DIAGNOSIS — N2581 Secondary hyperparathyroidism of renal origin: Secondary | ICD-10-CM | POA: Diagnosis not present

## 2018-04-10 DIAGNOSIS — R0902 Hypoxemia: Secondary | ICD-10-CM | POA: Insufficient documentation

## 2018-04-10 DIAGNOSIS — E875 Hyperkalemia: Secondary | ICD-10-CM | POA: Diagnosis not present

## 2018-04-10 DIAGNOSIS — D509 Iron deficiency anemia, unspecified: Secondary | ICD-10-CM | POA: Diagnosis not present

## 2018-04-10 DIAGNOSIS — D631 Anemia in chronic kidney disease: Secondary | ICD-10-CM | POA: Diagnosis not present

## 2018-04-13 DIAGNOSIS — E875 Hyperkalemia: Secondary | ICD-10-CM | POA: Diagnosis not present

## 2018-04-13 DIAGNOSIS — N186 End stage renal disease: Secondary | ICD-10-CM | POA: Diagnosis not present

## 2018-04-13 DIAGNOSIS — D509 Iron deficiency anemia, unspecified: Secondary | ICD-10-CM | POA: Diagnosis not present

## 2018-04-13 DIAGNOSIS — N2581 Secondary hyperparathyroidism of renal origin: Secondary | ICD-10-CM | POA: Diagnosis not present

## 2018-04-13 DIAGNOSIS — D631 Anemia in chronic kidney disease: Secondary | ICD-10-CM | POA: Diagnosis not present
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
## 2018-04-14 ENCOUNTER — Encounter: Payer: Self-pay | Admitting: Internal Medicine

## 2018-04-14 ENCOUNTER — Ambulatory Visit (INDEPENDENT_AMBULATORY_CARE_PROVIDER_SITE_OTHER): Payer: Medicare Other | Admitting: Internal Medicine

## 2018-04-14 VITALS — BP 173/65 | HR 98 | Temp 97.6°F | Ht 74.0 in | Wt 194.3 lb

## 2018-04-14 DIAGNOSIS — G4700 Insomnia, unspecified: Secondary | ICD-10-CM | POA: Diagnosis not present

## 2018-04-14 DIAGNOSIS — I712 Thoracic aortic aneurysm, without rupture: Secondary | ICD-10-CM | POA: Diagnosis not present

## 2018-04-14 DIAGNOSIS — F513 Sleepwalking [somnambulism]: Secondary | ICD-10-CM

## 2018-04-14 DIAGNOSIS — J449 Chronic obstructive pulmonary disease, unspecified: Secondary | ICD-10-CM

## 2018-04-14 DIAGNOSIS — Q613 Polycystic kidney, unspecified: Secondary | ICD-10-CM | POA: Diagnosis not present

## 2018-04-14 DIAGNOSIS — G8929 Other chronic pain: Secondary | ICD-10-CM | POA: Diagnosis not present

## 2018-04-14 DIAGNOSIS — Z9181 History of falling: Secondary | ICD-10-CM | POA: Diagnosis not present

## 2018-04-14 DIAGNOSIS — Z21 Asymptomatic human immunodeficiency virus [HIV] infection status: Secondary | ICD-10-CM | POA: Diagnosis not present

## 2018-04-14 DIAGNOSIS — Z992 Dependence on renal dialysis: Secondary | ICD-10-CM | POA: Diagnosis not present

## 2018-04-14 DIAGNOSIS — R55 Syncope and collapse: Secondary | ICD-10-CM

## 2018-04-14 DIAGNOSIS — N186 End stage renal disease: Secondary | ICD-10-CM

## 2018-04-14 DIAGNOSIS — Z86718 Personal history of other venous thrombosis and embolism: Secondary | ICD-10-CM

## 2018-04-14 DIAGNOSIS — R011 Cardiac murmur, unspecified: Secondary | ICD-10-CM | POA: Diagnosis not present

## 2018-04-14 DIAGNOSIS — I5022 Chronic systolic (congestive) heart failure: Secondary | ICD-10-CM | POA: Diagnosis not present

## 2018-04-14 DIAGNOSIS — F17210 Nicotine dependence, cigarettes, uncomplicated: Secondary | ICD-10-CM | POA: Diagnosis not present

## 2018-04-14 DIAGNOSIS — Z87820 Personal history of traumatic brain injury: Secondary | ICD-10-CM | POA: Diagnosis not present

## 2018-04-14 DIAGNOSIS — Z86711 Personal history of pulmonary embolism: Secondary | ICD-10-CM

## 2018-04-14 DIAGNOSIS — Z720 Tobacco use: Secondary | ICD-10-CM

## 2018-04-14 DIAGNOSIS — Z79899 Other long term (current) drug therapy: Secondary | ICD-10-CM | POA: Diagnosis not present

## 2018-04-14 DIAGNOSIS — Z79891 Long term (current) use of opiate analgesic: Secondary | ICD-10-CM

## 2018-04-14 NOTE — Patient Instructions (Addendum)
Thank you for coming to the clinic today. It was a pleasure to see you.   For your insomnia - the first recommended treatment is cognitive behavioral therapy where you learn techniques to help you sleep better. You can start seeing someone at [facility] in downtown [place] for this. When we have the results of your sleep study back we have a better idea of how to treat your insomnia.    For your chronic pain, I have refilled your percocet prescription for the next 3 months  Before the next visit in 3 months please see the cardiologist and sleep specialist.   Until we have figured out the problem with you passing out we have to recommend that you not drive, swim, or hold any infants   FOLLOW-UP INSTRUCTIONS When: August with Dr. Hetty Ely   For: Regular check up and refills of your pain medication What to bring: all of your medication bottles   Please call our clinic if you have any questions or concerns, we may be able to help and keep you from a long and expensive emergency room wait. Our clinic and after hours phone number is [phone], there is always someone available. If you need medication refills please notify your pharmacy one week in advance and they will send [place] a request.    Mindfulness-Based Stress Reduction Mindfulness-based stress reduction (MBSR) is a program that helps people learn to practice mindfulness. Mindfulness is the practice of intentionally paying attention to the present moment. It can be learned and practiced through techniques such as education, breathing exercises, meditation, and yoga. MBSR includes several mindfulness techniques in one program. MBSR works best when you understand the treatment, are willing to try new things, and can commit to spending time practicing what you learn. MBSR training may include learning about:  How your emotions, thoughts, and reactions affect your body.  New ways to respond to things that cause negative thoughts to start  (triggers).  How to notice your thoughts and let go of them.  Practicing awareness of everyday things that you normally do without thinking.  The techniques and goals of different types of meditation.  What are the benefits of MBSR? MBSR can have many benefits, which include helping you to:  Develop self-awareness. This refers to knowing and understanding yourself.  Learn skills and attitudes that help you to participate in your own health care.  Learn new ways to care for yourself.  Be more accepting about how things are, and let things go.  Be less judgmental and approach things with an open mind.  Be patient with yourself and trust yourself more.  MBSR has also been shown to:  Reduce negative emotions, such as depression and anxiety.  Improve memory and focus.  Change how you sense and approach pain.  Boost your body's ability to fight infections.  Help you connect better with other people.  Improve your sense of well-being.  Follow these instructions at home:  Find a local in-person or online MBSR program.  Set aside some time regularly for mindfulness practice.  Find a mindfulness practice that works best for you. This may include one or more of the following: ? Meditation. Meditation involves focusing your mind on a certain thought or activity. ? Breathing awareness exercises. These help you to stay present by focusing on your breath. ? Body scan. For this practice, you lie down and pay attention to each part of your body from head to toe. You can identify tension and soreness and  intentionally relax parts of your body. ? Yoga. Yoga involves stretching and breathing, and it can improve your ability to move and be flexible. It can also provide an experience of testing your body's limits, which can help you release stress. ? Mindful eating. This way of eating involves focusing on the taste, texture, color, and smell of each bite of food. Because this slows down  eating and helps you feel full sooner, it can be an important part of a weight-loss plan.  Find a podcast or recording that provides guidance for breathing awareness, body scan, or meditation exercises. You can listen to these any time when you have a free moment to rest without distractions.  Follow your treatment plan as told by your health care provider. This may include taking regular medicines and making changes to your diet or lifestyle as recommended. How to practice mindfulness To do a basic awareness exercise:  Find a comfortable place to sit.  Pay attention to the present moment. Observe your thoughts, feelings, and surroundings just as they are.  Avoid placing judgment on yourself, your feelings, or your surroundings. Make note of any judgment that comes up, and let it go.  Your mind may wander, and that is okay. Make note of when your thoughts drift, and return your attention to the present moment.  To do basic mindfulness meditation:  Find a comfortable place to sit. This may include a stable chair or a firm floor cushion. ? Sit upright with your back straight. Let your arms fall next to your side with your hands resting on your legs. ? If sitting in a chair, rest your feet flat on the floor. ? If sitting on a cushion, cross your legs in front of you.  Keep your head in a neutral position with your chin dropped slightly. Relax your jaw and rest the tip of your tongue on the roof of your mouth. Drop your gaze to the floor. You can close your eyes if you like.  Breathe normally and pay attention to your breath. Feel the air moving in and out of your nose. Feel your belly expanding and relaxing with each breath.  Your mind may wander, and that is okay. Make note of when your thoughts drift, and return your attention to your breath.  Avoid placing judgment on yourself, your feelings, or your surroundings. Make note of any judgment or feelings that come up, let them go, and bring  your attention back to your breath.  When you are ready, lift your gaze or open your eyes. Pay attention to how your body feels after the meditation.  Where to find more information: You can find more information about MBSR from:  Your health care provider.  Community-based meditation centers or programs.  Programs offered near you.  Summary  Mindfulness-based stress reduction (MBSR) is a program that teaches you how to intentionally pay attention to the present moment. It is used with other treatments to help you cope better with daily stress, emotions, and pain.  MBSR focuses on developing self-awareness, which allows you to respond to life stress without judgment or negative emotions.  MBSR programs may involve learning different mindfulness practices, such as breathing exercises, meditation, yoga, body scan, or mindful eating. Find a mindfulness practice that works best for you, and set aside time for it on a regular basis. This information is not intended to replace advice given to you by your health care provider. Make sure you discuss any questions you have with  your health care provider. Document Released: 04/10/2017 Document Revised: 04/10/2017 Document Reviewed: 04/10/2017 Elsevier Interactive Patient Education  [employer].

## 2018-04-15 ENCOUNTER — Encounter: Payer: Self-pay | Admitting: Internal Medicine

## 2018-04-15 DIAGNOSIS — N186 End stage renal disease: Secondary | ICD-10-CM | POA: Diagnosis not present

## 2018-04-15 DIAGNOSIS — D631 Anemia in chronic kidney disease: Secondary | ICD-10-CM | POA: Diagnosis not present

## 2018-04-15 DIAGNOSIS — D509 Iron deficiency anemia, unspecified: Secondary | ICD-10-CM | POA: Diagnosis not present

## 2018-04-15 DIAGNOSIS — N2581 Secondary hyperparathyroidism of renal origin: Secondary | ICD-10-CM | POA: Diagnosis not present

## 2018-04-15 DIAGNOSIS — I120 Hypertensive chronic kidney disease with stage 5 chronic kidney disease or end stage renal disease: Secondary | ICD-10-CM | POA: Diagnosis not present

## 2018-04-15 DIAGNOSIS — Z992 Dependence on renal dialysis: Secondary | ICD-10-CM | POA: Diagnosis not present

## 2018-04-15 MED ORDER — OXYCODONE-ACETAMINOPHEN 10-325 MG PO TABS: 1.0000 | ORAL_TABLET | Freq: Three times a day (TID) | ORAL | 0 refills | Status: DC | PRN

## 2018-04-15 MED ORDER — OXYCODONE-ACETAMINOPHEN 10-325 MG PO TABS: 1.0000 | ORAL_TABLET | Freq: Four times a day (QID) | ORAL | 0 refills | Status: DC | PRN

## 2018-04-15 MED ORDER — ROSUVASTATIN CALCIUM 10 MG PO TABS: 10.0000 mg | ORAL_TABLET | Freq: Every day | ORAL | 11 refills | Status: DC

## 2018-04-15 MED ORDER — DRONABINOL 5 MG PO CAPS: 5.0000 mg | ORAL_CAPSULE | Freq: Every day | ORAL | 3 refills | Status: DC

## 2018-04-15 MED ORDER — ROSUVASTATIN CALCIUM 20 MG PO TABS: 20.0000 mg | ORAL_TABLET | Freq: Every day | ORAL | 3 refills | Status: DC

## 2018-04-15 NOTE — Assessment & Plan Note (Signed)
EF 20-35% on echo last month. No signs of volume overload on exam. Beta blockers and antihypertensives have caused hypotension during dialysis in the past and been discontinued. Concern for starting aspirin given recent subdural hematoma from head trauma. Multiple syncopal events recently with etiology undetermined. He is in need of evaluation for ICD placement.  - start rosuvastatin 10 mg qd, lower dose is for ESRD   - referral to cardiology for evaluation of ICD placement

## 2018-04-15 NOTE — Assessment & Plan Note (Signed)
History of numerous syncopal events in the last 4 months, four of which he sought medical care for. The first was in December 2018 when he struck a newly placed fistula while sleepwalking.  An office visit in February he reported headaches after two episodes of passing out during the night and waking up on the floor with blood on the back of his head, CT head revealed two subdural hematomas. At office follow up in March he reported another episode of finding him self on the kitchen floor after falling asleep on the couch, he had facial trauma and was found to have expansion of the subdural hematoma. It was felt that his symptoms may be related to orthostasis after dialysis. Ar hospital follow up in April he request a prescription for valium or klonipine, he described being unable to sleep due to racing thoughts despite extreme fatigue and was asked to complete a sleep study. At some point after the office visit he obtained a prescription from valium from his nephrologist and presented to the ED a few days later with a clavicular fracture from sleep walking and collapse. Most recent hospitalization was in mid April when he presented after waking up on the floor outside of his car with a feeling of difficulty breathing while walking out to his car leading up to the event (he was not sleep walking at the time). He felt the shortness of breath was secondary to volume overload from missed HD the days leading up. BNP was 4500, CT PE neg, no acute changes on CT head.  Echo revealed EF 20-25% with diffuse hypokinesia, 72 hours of telemetry monitoring did not reveal arrhythmia. He reported witnessed sleep walking during the hospitalization but did not have collapse through the night. At times he has ETOH and had this in combination with the valium and percocet in the day leading up to hospitalization. Since discharge he has had no further syncope or sleep walking. He has continued to have dry weight challenged at HD and  feels that his breathing is completely improved back to baseline. We discussed the necessity of more testing and follow up to complete this workup, he feels that his schedule will be open towards the end of this month and says that he will be diligent with communicating if he needs to reschedule any of these appointments.  - Brian [name] was asked to stop driving until we have found and treated the cause of these frequent syncopal episodes  - will follow up sleep study results, Brian [name] was asked to schedule his follow up with sleep medicine   -seizure has not yet been ruled out- will refer for I have called the sleep center where he had the sleep study and although he was placed under EEG monitoring the study was not performed with their seizure protocol so it cannot be used as an EEG study. If this screening EEG is negative, may need to proceed with specialized techniques for EEG ( sleep deprivation, bright light to further rule out this diagnosis)  - follow up with cards for evaluation of ICD placement  - recommended CBT as a first line treatment for insomnia due to restless thoughts  - encouraged cutting back on ETOH and reminded of controlled substance contract

## 2018-04-15 NOTE — Assessment & Plan Note (Signed)
Brian [name] is on chronic opioid therapy for chronic pain. The date of the controlled substances contract is referenced in the [place] and / or the overview. Date of pain contract was 08/13/2017. As part of the treatment plan, the [place] controlled substance database is checked at least twice yearly and the database results are not appropriate. I have last reviewed the results on 04/14/2018.   The blood toxicology was on 02/05/2018 and results are as expected. Patient needs at least a yearly UDS.   The patient is on oxycodone/acetaminophen (Percocet, Tylox) strength 10, 90 per 30 days and marinol 5 mg bid Adjunctive treatment includes Gabapentin. This regimen allows Brian [name] to function and does not cause excessive sedation or other side effects.  "The benefits of continuing opioid therapy outweigh the risks and chronic opioids will be continued. Ongoing education about safe opioid treatment is provided  Interventions today include: Refills - 3 Rx sent to pharmacy  - Brian [name] is open to tapering marinol to once daily dosing  - red flags which are new since the time of last chronic pain medication visit include valium and oxycodone rx on recent database search, ETOH use, and syncope resulting in injury ( subdural hematoma, clavicular fracture, facial laceration), and obtaining early refills (01/11/18 - 02/05/18- 03/02/18 - 04/02/18) - reminded of the controlled substance contract

## 2018-04-16 NOTE — Progress Notes (Signed)
Internal Medicine Clinic Attending  Case discussed with Dr. Blum at the time of the visit.  We reviewed the resident's history and exam and pertinent patient test results.  I agree with the assessment, diagnosis, and plan of care documented in the resident's note. 

## 2018-04-17 DIAGNOSIS — N2581 Secondary hyperparathyroidism of renal origin: Secondary | ICD-10-CM | POA: Diagnosis not present

## 2018-04-17 DIAGNOSIS — N186 End stage renal disease: Secondary | ICD-10-CM | POA: Diagnosis not present

## 2018-04-17 DIAGNOSIS — D509 Iron deficiency anemia, unspecified: Secondary | ICD-10-CM | POA: Diagnosis not present

## 2018-04-17 DIAGNOSIS — D631 Anemia in chronic kidney disease: Secondary | ICD-10-CM | POA: Diagnosis not present

## 2018-04-17 NOTE — Telephone Encounter (Signed)
Appt made and completed see Ref in Epic.

## 2018-04-20 DIAGNOSIS — D509 Iron deficiency anemia, unspecified: Secondary | ICD-10-CM | POA: Diagnosis not present

## 2018-04-20 DIAGNOSIS — N186 End stage renal disease: Secondary | ICD-10-CM | POA: Diagnosis not present

## 2018-04-20 DIAGNOSIS — N2581 Secondary hyperparathyroidism of renal origin: Secondary | ICD-10-CM | POA: Diagnosis not present

## 2018-04-20 DIAGNOSIS — D631 Anemia in chronic kidney disease: Secondary | ICD-10-CM | POA: Diagnosis not present

## 2018-04-22 DIAGNOSIS — N186 End stage renal disease: Secondary | ICD-10-CM | POA: Diagnosis not present

## 2018-04-22 DIAGNOSIS — N2581 Secondary hyperparathyroidism of renal origin: Secondary | ICD-10-CM | POA: Diagnosis not present

## 2018-04-22 DIAGNOSIS — D631 Anemia in chronic kidney disease: Secondary | ICD-10-CM | POA: Diagnosis not present

## 2018-04-22 DIAGNOSIS — D509 Iron deficiency anemia, unspecified: Secondary | ICD-10-CM | POA: Diagnosis not present

## 2018-04-23 ENCOUNTER — Telehealth: Payer: Self-pay | Admitting: *Deleted

## 2018-04-24 ENCOUNTER — Other Ambulatory Visit: Payer: Self-pay | Admitting: Internal Medicine

## 2018-04-24 DIAGNOSIS — D631 Anemia in chronic kidney disease: Secondary | ICD-10-CM | POA: Diagnosis not present

## 2018-04-24 DIAGNOSIS — N186 End stage renal disease: Secondary | ICD-10-CM | POA: Diagnosis not present

## 2018-04-24 DIAGNOSIS — N2581 Secondary hyperparathyroidism of renal origin: Secondary | ICD-10-CM | POA: Diagnosis not present

## 2018-04-24 DIAGNOSIS — D509 Iron deficiency anemia, unspecified: Secondary | ICD-10-CM | POA: Diagnosis not present

## 2018-04-24 DIAGNOSIS — B20 Human immunodeficiency virus [HIV] disease: Secondary | ICD-10-CM

## 2018-04-27 DIAGNOSIS — N2581 Secondary hyperparathyroidism of renal origin: Secondary | ICD-10-CM | POA: Diagnosis not present

## 2018-04-27 DIAGNOSIS — D509 Iron deficiency anemia, unspecified: Secondary | ICD-10-CM | POA: Diagnosis not present

## 2018-04-27 DIAGNOSIS — N186 End stage renal disease: Secondary | ICD-10-CM | POA: Diagnosis not present

## 2018-04-27 DIAGNOSIS — D631 Anemia in chronic kidney disease: Secondary | ICD-10-CM | POA: Diagnosis not present

## 2018-04-29 DIAGNOSIS — D631 Anemia in chronic kidney disease: Secondary | ICD-10-CM | POA: Diagnosis not present

## 2018-04-29 DIAGNOSIS — N186 End stage renal disease: Secondary | ICD-10-CM | POA: Diagnosis not present

## 2018-04-29 DIAGNOSIS — D509 Iron deficiency anemia, unspecified: Secondary | ICD-10-CM | POA: Diagnosis not present

## 2018-04-29 DIAGNOSIS — N2581 Secondary hyperparathyroidism of renal origin: Secondary | ICD-10-CM | POA: Diagnosis not present

## 2018-04-30 ENCOUNTER — Other Ambulatory Visit: Payer: Self-pay | Admitting: *Deleted

## 2018-04-30 NOTE — Progress Notes (Signed)
Patient instructed to be at [facility] admitting department at 8 am on 05/13/18 for surgery. NPO past [place] night prior and will need a driver for home. Expect a call and follow the detailed instructions received from pre-admission department about this surgery. Verbalized understanding.  I spoke with TOM BAILEY at [facility] to reschedule patient's dialysis days to accommodate his surgery date.

## 2018-05-01 DIAGNOSIS — D631 Anemia in chronic kidney disease: Secondary | ICD-10-CM | POA: Diagnosis not present

## 2018-05-01 DIAGNOSIS — N2581 Secondary hyperparathyroidism of renal origin: Secondary | ICD-10-CM | POA: Diagnosis not present

## 2018-05-01 DIAGNOSIS — N186 End stage renal disease: Secondary | ICD-10-CM | POA: Diagnosis not present

## 2018-05-01 DIAGNOSIS — D509 Iron deficiency anemia, unspecified: Secondary | ICD-10-CM | POA: Diagnosis not present

## 2018-05-02 ENCOUNTER — Telehealth: Payer: Self-pay | Admitting: Internal Medicine

## 2018-05-02 NOTE — Telephone Encounter (Signed)
   Reason for call:   I received a call from [name] at 9 AM indicating that he ran out of his Percocet prescription.   Pertinent Data:   Patient has a chronic opioid dependent pain for multiple reasons, he picked up his last prescription on April 02, 2018.he went to the pharmacy today to pick up the new one and they told him that he can only get his Percocet tomorrow that will be after 30 days.  Apparently patient took a few extra pills and ran out of his pills before the whole month.   Assessment / Plan / Recommendations:   On chart review patient had these early requests a few times before.  He is also getting diazepam which is not an his clinic med list, last pickup was Apr 29, 2018.  Told the patient that I will not be able to help him in that regard and that he has to wait till tomorrow to get his pain meds.  He should not be taking more than what is prescribed as overdose can be dangerous.  I also advised him to use naproxen today to help with his pain, patient refused stating that naproxen does not help and he has already tried before.  As always, pt is advised that if symptoms worsen or new symptoms arise, they should go to an urgent care facility or to to ER for further evaluation.   Lorella Nimrod, MD   05/02/2018, 9:14 AM

## 2018-05-04 ENCOUNTER — Other Ambulatory Visit: Payer: Self-pay | Admitting: Internal Medicine

## 2018-05-04 DIAGNOSIS — N2581 Secondary hyperparathyroidism of renal origin: Secondary | ICD-10-CM | POA: Diagnosis not present

## 2018-05-04 DIAGNOSIS — D631 Anemia in chronic kidney disease: Secondary | ICD-10-CM | POA: Diagnosis not present

## 2018-05-04 DIAGNOSIS — N186 End stage renal disease: Secondary | ICD-10-CM | POA: Diagnosis not present

## 2018-05-04 DIAGNOSIS — D509 Iron deficiency anemia, unspecified: Secondary | ICD-10-CM | POA: Diagnosis not present

## 2018-05-05 DIAGNOSIS — G4733 Obstructive sleep apnea (adult) (pediatric): Secondary | ICD-10-CM | POA: Diagnosis not present

## 2018-05-05 NOTE — Procedures (Signed)
Patient Name: Brian [name], Brian [name] Date: 04/10/2018 Gender: Male D.O.B: 1976/11/15 Age (years): 41 Referring Provider: Oval Linsey Height (inches): 72 Interpreting Physician: Baird Lyons MD, ABSM Weight (lbs): 185 RPSGT: Baxter Flattery BMI: 25 MRN: [id number] Neck Size: 16.00  CLINICAL INFORMATION Sleep Study Type: NPSG Indication for sleep study: Depression, Hypertension, Snoring, Witnesses Apnea / Gasping During Sleep  Epworth Sleepiness Score: 13  SLEEP STUDY TECHNIQUE As per the AASM Manual for the Scoring of Sleep and Associated Events v2.3 (April 2016) with a hypopnea requiring 4% desaturations.  The channels recorded and monitored were frontal, central and occipital EEG, electrooculogram (EOG), submentalis EMG (chin), nasal and oral airflow, thoracic and abdominal wall motion, anterior tibialis EMG, snore microphone, electrocardiogram, and pulse oximetry.  MEDICATIONS Medications self-administered by patient taken the night of the study : ALBUTEROL, CALCIUM ACETATE, DESCOVY, FLONASE, OXYCODONE HCL  SLEEP ARCHITECTURE The study was initiated at 11:01:11 PM and ended at 4:58:59 AM.  Sleep onset time was 0.0 minutes and the sleep efficiency was 77.6%%. The total sleep time was 277.8 minutes.  Stage REM latency was 39.8 minutes.  The patient spent 12.8%% of the night in stage N1 sleep, 63.5%% in stage N2 sleep, 0.0%% in stage N3 and 23.76% in REM.  Alpha intrusion was absent.  Supine sleep was 19.58%.  RESPIRATORY PARAMETERS The overall apnea/hypopnea index (AHI) was 21.8 per hour. There were 42 total apneas, including 2 obstructive, 32 central and 8 mixed apneas. There were 59 hypopneas and 5 RERAs.  The AHI during Stage REM sleep was 4.5 per hour.  AHI while supine was 50.7 per hour.  The mean oxygen saturation was 89.5%. The minimum SpO2 during sleep was 72.0%.  loud snoring was noted during this study.  CARDIAC DATA The 2 lead EKG demonstrated sinus  rhythm. The mean heart rate was 86.5 beats per minute. Other EKG findings include: PVCs.  LEG MOVEMENT DATA The total PLMS were 0 with a resulting PLMS index of 0.0. Associated arousal with leg movement index was 0.6 .  IMPRESSIONS - Moderate obstructive sleep apnea occurred during this study (AHI = 21.8/h). - Mild central sleep apnea occurred during this study (CAI = 6.9/h). - Moderate oxygen desaturation was noted during this study (Min O2 = 72.0%). - The patient snored with loud snoring volume. - EKG findings include PVCs. - Clinically significant periodic limb movements did not occur during sleep. No significant associated arousals.  DIAGNOSIS - Obstructive Sleep Apnea (327.23 [G47.33 ICD-10]) - Central Sleep Apnea (327.27 [G47.37 ICD-10]) - Nocturnal Hypoxemia (327.26 [G47.36 ICD-10])  RECOMMENDATIONS - Suggest  return for CPAP titration sleep study to determine optimal pressure required to alleviate sleep disordered breathing. BiPAP or ASV titration may be required to eliminate central sleep apnea, if clinically indicated. If PAP sufficent to correct OSA does not correct hypoxemia, this may document qualification to add supplemental O2 to PAP. - Positional therapy avoiding supine position during sleep. - Be careful with alcohol, sedatives and other CNS depressants that may worsen sleep apnea and disrupt normal sleep architecture. - Sleep hygiene should be reviewed to assess factors that may improve sleep quality. - Weight management and regular exercise should be initiated or continued if appropriate.  [Electronically signed] 05/05/2018 02:52 PM  Baird Lyons MD, [place], American Board of Sleep Medicine   NPI: [id number]                           [place], American Board of  Sleep Medicine  ELECTRONICALLY SIGNED ON:  05/05/2018, 2:47 PM [place] PH: [phone]   FX: [phone] [place]

## 2018-05-06 ENCOUNTER — Telehealth: Payer: Self-pay | Admitting: Internal Medicine

## 2018-05-06 DIAGNOSIS — G4733 Obstructive sleep apnea (adult) (pediatric): Secondary | ICD-10-CM

## 2018-05-06 DIAGNOSIS — N2581 Secondary hyperparathyroidism of renal origin: Secondary | ICD-10-CM | POA: Diagnosis not present

## 2018-05-06 DIAGNOSIS — D509 Iron deficiency anemia, unspecified: Secondary | ICD-10-CM | POA: Diagnosis not present

## 2018-05-06 DIAGNOSIS — D631 Anemia in chronic kidney disease: Secondary | ICD-10-CM | POA: Diagnosis not present

## 2018-05-06 DIAGNOSIS — N186 End stage renal disease: Secondary | ICD-10-CM | POA: Diagnosis not present

## 2018-05-06 NOTE — Telephone Encounter (Signed)
This encounter was created in order to order CPAP titration for Brian [name] as his sleep study showed moderate obstructive sleep apnea.

## 2018-05-08 DIAGNOSIS — N186 End stage renal disease: Secondary | ICD-10-CM | POA: Diagnosis not present

## 2018-05-08 DIAGNOSIS — D631 Anemia in chronic kidney disease: Secondary | ICD-10-CM | POA: Diagnosis not present

## 2018-05-08 DIAGNOSIS — D509 Iron deficiency anemia, unspecified: Secondary | ICD-10-CM | POA: Diagnosis not present

## 2018-05-08 DIAGNOSIS — N2581 Secondary hyperparathyroidism of renal origin: Secondary | ICD-10-CM | POA: Diagnosis not present

## 2018-05-11 DIAGNOSIS — D509 Iron deficiency anemia, unspecified: Secondary | ICD-10-CM | POA: Diagnosis not present

## 2018-05-11 DIAGNOSIS — N186 End stage renal disease: Secondary | ICD-10-CM | POA: Diagnosis not present

## 2018-05-11 DIAGNOSIS — D631 Anemia in chronic kidney disease: Secondary | ICD-10-CM | POA: Diagnosis not present

## 2018-05-11 DIAGNOSIS — N2581 Secondary hyperparathyroidism of renal origin: Secondary | ICD-10-CM | POA: Diagnosis not present

## 2018-05-12 ENCOUNTER — Other Ambulatory Visit: Payer: Self-pay

## 2018-05-12 ENCOUNTER — Encounter (HOSPITAL_COMMUNITY): Payer: Self-pay | Admitting: *Deleted

## 2018-05-12 NOTE — Progress Notes (Signed)
Spoke with pt for pre-op call. Pt states he has a hx of a heart murmur and a Thoracic Aortic Aneurysm which he states is followed by his PCP Ledell Noss. Pt states he's had a sleep study recently but has not gotten the results yet. Pt denies any recent chest pain or sob.

## 2018-05-13 ENCOUNTER — Encounter (HOSPITAL_COMMUNITY)
Admission: RE | Disposition: A | Discharge status: Home / Self Care | Payer: Self-pay | Source: Ambulatory Visit | Attending: Vascular Surgery

## 2018-05-13 ENCOUNTER — Ambulatory Visit (HOSPITAL_COMMUNITY): Payer: Medicare Other | Admitting: Certified Registered Nurse Anesthetist

## 2018-05-13 ENCOUNTER — Encounter (HOSPITAL_COMMUNITY): Payer: Self-pay | Admitting: Urology

## 2018-05-13 ENCOUNTER — Ambulatory Visit (HOSPITAL_COMMUNITY)
Admission: RE | Admit: 2018-05-13 | Discharge: 2018-05-13 | Disposition: A | Discharge status: Home / Self Care | Payer: Medicare Other | Source: Ambulatory Visit | Attending: Vascular Surgery | Admitting: Vascular Surgery

## 2018-05-13 DIAGNOSIS — M549 Dorsalgia, unspecified: Secondary | ICD-10-CM | POA: Diagnosis not present

## 2018-05-13 DIAGNOSIS — K219 Gastro-esophageal reflux disease without esophagitis: Secondary | ICD-10-CM | POA: Diagnosis not present

## 2018-05-13 DIAGNOSIS — T82898A Other specified complication of vascular prosthetic devices, implants and grafts, initial encounter: Secondary | ICD-10-CM | POA: Insufficient documentation

## 2018-05-13 DIAGNOSIS — G2581 Restless legs syndrome: Secondary | ICD-10-CM | POA: Diagnosis not present

## 2018-05-13 DIAGNOSIS — F419 Anxiety disorder, unspecified: Secondary | ICD-10-CM | POA: Diagnosis not present

## 2018-05-13 DIAGNOSIS — F329 Major depressive disorder, single episode, unspecified: Secondary | ICD-10-CM | POA: Diagnosis not present

## 2018-05-13 DIAGNOSIS — I132 Hypertensive heart and chronic kidney disease with heart failure and with stage 5 chronic kidney disease, or end stage renal disease: Secondary | ICD-10-CM | POA: Diagnosis not present

## 2018-05-13 DIAGNOSIS — G629 Polyneuropathy, unspecified: Secondary | ICD-10-CM | POA: Insufficient documentation

## 2018-05-13 DIAGNOSIS — J449 Chronic obstructive pulmonary disease, unspecified: Secondary | ICD-10-CM | POA: Diagnosis not present

## 2018-05-13 DIAGNOSIS — Z8249 Family history of ischemic heart disease and other diseases of the circulatory system: Secondary | ICD-10-CM | POA: Insufficient documentation

## 2018-05-13 DIAGNOSIS — Z7951 Long term (current) use of inhaled steroids: Secondary | ICD-10-CM | POA: Diagnosis not present

## 2018-05-13 DIAGNOSIS — Z86711 Personal history of pulmonary embolism: Secondary | ICD-10-CM | POA: Diagnosis not present

## 2018-05-13 DIAGNOSIS — N186 End stage renal disease: Secondary | ICD-10-CM | POA: Diagnosis not present

## 2018-05-13 DIAGNOSIS — F17210 Nicotine dependence, cigarettes, uncomplicated: Secondary | ICD-10-CM | POA: Insufficient documentation

## 2018-05-13 DIAGNOSIS — I509 Heart failure, unspecified: Secondary | ICD-10-CM | POA: Insufficient documentation

## 2018-05-13 DIAGNOSIS — Z86718 Personal history of other venous thrombosis and embolism: Secondary | ICD-10-CM | POA: Insufficient documentation

## 2018-05-13 DIAGNOSIS — G8929 Other chronic pain: Secondary | ICD-10-CM | POA: Diagnosis not present

## 2018-05-13 DIAGNOSIS — D631 Anemia in chronic kidney disease: Secondary | ICD-10-CM | POA: Insufficient documentation

## 2018-05-13 DIAGNOSIS — Y832 Surgical operation with anastomosis, bypass or graft as the cause of abnormal reaction of the patient, or of later complication, without mention of misadventure at the time of the procedure: Secondary | ICD-10-CM | POA: Insufficient documentation

## 2018-05-13 DIAGNOSIS — Q613 Polycystic kidney, unspecified: Secondary | ICD-10-CM | POA: Insufficient documentation

## 2018-05-13 DIAGNOSIS — B20 Human immunodeficiency virus [HIV] disease: Secondary | ICD-10-CM | POA: Insufficient documentation

## 2018-05-13 DIAGNOSIS — Z7901 Long term (current) use of anticoagulants: Secondary | ICD-10-CM | POA: Insufficient documentation

## 2018-05-13 DIAGNOSIS — I5022 Chronic systolic (congestive) heart failure: Secondary | ICD-10-CM | POA: Diagnosis not present

## 2018-05-13 DIAGNOSIS — Z992 Dependence on renal dialysis: Secondary | ICD-10-CM | POA: Diagnosis not present

## 2018-05-13 HISTORY — PX: LIGATION OF ARTERIOVENOUS  FISTULA: SHX5948

## 2018-05-13 HISTORY — DX: Syncope and collapse: R55

## 2018-05-13 HISTORY — DX: Personal history of urinary calculi: Z87.442

## 2018-05-13 LAB — POCT I-STAT 4, (NA,K, GLUC, HGB,HCT)
Glucose, Bld: 88 mg/dL (ref 65–99)
HCT: 30 % — ABNORMAL LOW (ref 39.0–52.0)
HEMOGLOBIN: 10.2 g/dL — AB (ref 13.0–17.0)
POTASSIUM: 5.3 mmol/L — AB (ref 3.5–5.1)
SODIUM: 135 mmol/L (ref 135–145)

## 2018-05-13 SURGERY — LIGATION OF ARTERIOVENOUS  FISTULA
Anesthesia: General | Site: Arm Upper | Laterality: Right

## 2018-05-13 MED ORDER — ARTIFICIAL TEARS OPHTHALMIC OINT
TOPICAL_OINTMENT | OPHTHALMIC | Status: AC
Start: 2018-05-13 — End: ?
  Filled 2018-05-13: qty 3.5

## 2018-05-13 MED ORDER — ONDANSETRON HCL 4 MG/2ML IJ SOLN
INTRAMUSCULAR | Status: DC | PRN
  Administered 2018-05-13: 4 mg via INTRAVENOUS

## 2018-05-13 MED ORDER — 0.9 % SODIUM CHLORIDE (POUR BTL) OPTIME
TOPICAL | Status: DC | PRN
  Administered 2018-05-13: 1000 mL

## 2018-05-13 MED ORDER — ONDANSETRON HCL 4 MG/2ML IJ SOLN
INTRAMUSCULAR | Status: AC
  Filled 2018-05-13: qty 2

## 2018-05-13 MED ORDER — PHENYLEPHRINE 40 MCG/ML (10ML) SYRINGE FOR IV PUSH (FOR BLOOD PRESSURE SUPPORT)
PREFILLED_SYRINGE | INTRAVENOUS | Status: DC | PRN
  Administered 2018-05-13: 120 ug via INTRAVENOUS

## 2018-05-13 MED ORDER — SODIUM CHLORIDE 0.9 % IV SOLN
INTRAVENOUS | Status: AC
  Filled 2018-05-13: qty 1.2

## 2018-05-13 MED ORDER — LIDOCAINE 2% (20 MG/ML) 5 ML SYRINGE
INTRAMUSCULAR | Status: AC
  Filled 2018-05-13: qty 5

## 2018-05-13 MED ORDER — SODIUM CHLORIDE 0.9 % IV SOLN
INTRAVENOUS | Status: DC
  Administered 2018-05-13 (×2): via INTRAVENOUS

## 2018-05-13 MED ORDER — MIDAZOLAM HCL 5 MG/5ML IJ SOLN
INTRAMUSCULAR | Status: DC | PRN
  Administered 2018-05-13 (×2): 1 mg via INTRAVENOUS

## 2018-05-13 MED ORDER — LIDOCAINE-EPINEPHRINE 0.5 %-1:200000 IJ SOLN
INTRAMUSCULAR | Status: DC | PRN
  Administered 2018-05-13: 30 mL

## 2018-05-13 MED ORDER — HYDROMORPHONE HCL 2 MG/ML IJ SOLN
INTRAMUSCULAR | Status: AC
  Administered 2018-05-13: 0.5 mg via INTRAVENOUS
  Filled 2018-05-13: qty 1

## 2018-05-13 MED ORDER — MIDAZOLAM HCL 2 MG/2ML IJ SOLN
INTRAMUSCULAR | Status: AC
  Filled 2018-05-13: qty 2

## 2018-05-13 MED ORDER — CEFAZOLIN SODIUM-DEXTROSE 2-4 GM/100ML-% IV SOLN
2.0000 g | INTRAVENOUS | Status: AC
  Administered 2018-05-13: 2 g via INTRAVENOUS

## 2018-05-13 MED ORDER — LIDOCAINE-EPINEPHRINE 0.5 %-1:200000 IJ SOLN
INTRAMUSCULAR | Status: AC
  Filled 2018-05-13: qty 1

## 2018-05-13 MED ORDER — CEFAZOLIN SODIUM-DEXTROSE 2-4 GM/100ML-% IV SOLN
INTRAVENOUS | Status: AC
  Filled 2018-05-13: qty 100

## 2018-05-13 MED ORDER — PROPOFOL 500 MG/50ML IV EMUL
INTRAVENOUS | Status: DC | PRN
  Administered 2018-05-13: 50 ug/kg/min via INTRAVENOUS

## 2018-05-13 MED ORDER — OXYCODONE-ACETAMINOPHEN 10-325 MG PO TABS: 1.0000 | ORAL_TABLET | Freq: Four times a day (QID) | ORAL | 0 refills | Status: DC | PRN

## 2018-05-13 MED ORDER — PROMETHAZINE HCL 25 MG/ML IJ SOLN: 6.2500 mg | INTRAMUSCULAR | Status: DC | PRN

## 2018-05-13 MED ORDER — HYDROMORPHONE HCL 2 MG/ML IJ SOLN
0.2500 mg | INTRAMUSCULAR | Status: DC | PRN
  Administered 2018-05-13 (×2): 0.5 mg via INTRAVENOUS

## 2018-05-13 MED ORDER — CHLORHEXIDINE GLUCONATE 4 % EX LIQD: 60.0000 mL | Freq: Once | CUTANEOUS | Status: DC

## 2018-05-13 MED ORDER — FENTANYL CITRATE (PF) 250 MCG/5ML IJ SOLN
INTRAMUSCULAR | Status: AC
  Filled 2018-05-13: qty 5

## 2018-05-13 MED ORDER — PROPOFOL 10 MG/ML IV BOLUS
INTRAVENOUS | Status: AC
  Filled 2018-05-13: qty 20

## 2018-05-13 MED ORDER — DEXAMETHASONE SODIUM PHOSPHATE 10 MG/ML IJ SOLN
INTRAMUSCULAR | Status: AC
  Filled 2018-05-13: qty 1

## 2018-05-13 MED ORDER — FENTANYL CITRATE (PF) 100 MCG/2ML IJ SOLN
INTRAMUSCULAR | Status: DC | PRN
  Administered 2018-05-13: 50 ug via INTRAVENOUS
  Administered 2018-05-13 (×2): 25 ug via INTRAVENOUS

## 2018-05-13 SURGICAL SUPPLY — 39 items
ADH SKN CLS APL DERMABOND .7 (GAUZE/BANDAGES/DRESSINGS) ×2
BANDAGE ACE 4X5 VEL STRL LF (GAUZE/BANDAGES/DRESSINGS) ×2 IMPLANT
BNDG GAUZE ELAST 4 BULKY (GAUZE/BANDAGES/DRESSINGS) ×2 IMPLANT
CANISTER SUCT 3000ML PPV (MISCELLANEOUS) ×3 IMPLANT
CLIP LIGATING EXTRA MED SLVR (CLIP) ×3 IMPLANT
CLIP LIGATING EXTRA SM BLUE (MISCELLANEOUS) ×3 IMPLANT
DECANTER SPIKE VIAL GLASS SM (MISCELLANEOUS) ×3 IMPLANT
DERMABOND ADVANCED (GAUZE/BANDAGES/DRESSINGS) ×4
DERMABOND ADVANCED .7 DNX12 (GAUZE/BANDAGES/DRESSINGS) ×1 IMPLANT
DRAPE HALF SHEET 40X57 (DRAPES) ×2 IMPLANT
ELECT REM PT RETURN 9FT ADLT (ELECTROSURGICAL) ×3
ELECTRODE REM PT RTRN 9FT ADLT (ELECTROSURGICAL) ×1 IMPLANT
GLOVE BIO SURGEON STRL SZ 6.5 (GLOVE) ×1 IMPLANT
GLOVE BIO SURGEON STRL SZ7.5 (GLOVE) ×2 IMPLANT
GLOVE BIO SURGEONS STRL SZ 6.5 (GLOVE) ×1
GLOVE BIOGEL PI IND STRL 6.5 (GLOVE) IMPLANT
GLOVE BIOGEL PI IND STRL 8 (GLOVE) IMPLANT
GLOVE BIOGEL PI INDICATOR 6.5 (GLOVE) ×4
GLOVE BIOGEL PI INDICATOR 8 (GLOVE) ×2
GLOVE SS BIOGEL STRL SZ 7.5 (GLOVE) ×1 IMPLANT
GLOVE SUPERSENSE BIOGEL SZ 7.5 (GLOVE) ×2
GOWN STRL REUS W/ TWL LRG LVL3 (GOWN DISPOSABLE) ×3 IMPLANT
GOWN STRL REUS W/TWL LRG LVL3 (GOWN DISPOSABLE) ×9
KIT BASIN OR (CUSTOM PROCEDURE TRAY) ×3 IMPLANT
KIT TURNOVER KIT B (KITS) ×3 IMPLANT
NS IRRIG 1000ML POUR BTL (IV SOLUTION) ×3 IMPLANT
PACK CV ACCESS (CUSTOM PROCEDURE TRAY) ×3 IMPLANT
PAD ARMBOARD 7.5X6 YLW CONV (MISCELLANEOUS) ×6 IMPLANT
SLEEVE SURGEON STRL (DRAPES) ×2 IMPLANT
SPONGE LAP 18X18 X RAY DECT (DISPOSABLE) ×2 IMPLANT
SUT ETHILON 3 0 PS 1 (SUTURE) IMPLANT
SUT PROLENE 5 0 C 1 24 (SUTURE) ×4 IMPLANT
SUT PROLENE 6 0 CC (SUTURE) IMPLANT
SUT SILK 0 TIES 10X30 (SUTURE) ×3 IMPLANT
SUT VIC AB 3-0 SH 27 (SUTURE) ×6
SUT VIC AB 3-0 SH 27X BRD (SUTURE) ×1 IMPLANT
TOWEL GREEN STERILE (TOWEL DISPOSABLE) ×3 IMPLANT
UNDERPAD 30X30 (UNDERPADS AND DIAPERS) ×3 IMPLANT
WATER STERILE IRR 1000ML POUR (IV SOLUTION) ×3 IMPLANT

## 2018-05-13 NOTE — Anesthesia Postprocedure Evaluation (Signed)
Anesthesia Post Note  Patient: Brian [name]  Procedure(s) Performed: LIGATION and Resection OF ARTERIOVENOUS  FISTULA RIGHT ARM (Right Arm Upper)     Patient location during evaluation: PACU Anesthesia Type: MAC Level of consciousness: awake and alert Pain management: pain level controlled Vital Signs Assessment: post-procedure vital signs reviewed and stable Respiratory status: spontaneous breathing and respiratory function stable Cardiovascular status: stable Postop Assessment: no apparent nausea or vomiting Anesthetic complications: no                  Ashtyn Freilich DANIEL
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
## 2018-05-13 NOTE — Transfer of Care (Signed)
Immediate Anesthesia Transfer of Care Note  Patient: Brian [name]  Procedure(s) Performed: LIGATION and Resection OF ARTERIOVENOUS  FISTULA RIGHT ARM (Right Arm Upper)  Patient Location: PACU  Anesthesia Type:MAC  Level of Consciousness: awake, alert  and oriented  Airway & Oxygen Therapy: Patient Spontanous Breathing and Patient connected to nasal cannula oxygen  Post-op Assessment: Report given to RN and Post -op Vital signs reviewed and stable  Post vital signs: Reviewed and stable  Last Vitals:  Vitals Value Taken Time  BP 148/90 05/13/2018 12:38 PM  Temp    Pulse 85 05/13/2018 12:38 PM  Resp 12 05/13/2018 12:38 PM  SpO2 98 % 05/13/2018 12:38 PM  Vitals shown include unvalidated device data.  Last Pain:  Vitals:   05/13/18 0847  TempSrc: Oral  PainSc:       Patients Stated Pain Goal: 0 (48/54/62 7035)  Complications: No apparent anesthesia complications

## 2018-05-13 NOTE — OR Nursing (Signed)
Dilaudid 1 mg wasted in sink and witnessed by Corey Skains, Therapist, sports

## 2018-05-13 NOTE — Anesthesia Preprocedure Evaluation (Addendum)
Anesthesia Evaluation  Patient identified by MRN, date of birth, ID band Patient awake    Reviewed: Allergy & Precautions, H&P , NPO status , Patient's Chart, lab work & pertinent test results  History of Anesthesia Complications (+) AWARENESS UNDER ANESTHESIA and history of anesthetic complications  Airway Mallampati: II  TM Distance: >3 FB Neck ROM: Full    Dental no notable dental hx. (+) Edentulous Upper, Edentulous Lower, Dental Advisory Given   Pulmonary shortness of breath and with exertion, pneumonia, resolved, COPD,  COPD inhaler, Current Smoker,    Pulmonary exam normal breath sounds clear to auscultation       Cardiovascular hypertension, Pt. on medications + Peripheral Vascular Disease and +CHF  + Valvular Problems/Murmurs  Rhythm:Regular Rate:Normal  Hx/o Bilateral DVT's LE Hx/o PTE Study Conclusions  - Left ventricle: The cavity size was severely dilated. Wall   thickness was increased in a pattern of mild LVH. Systolic   function was severely reduced. The estimated ejection fraction   was in the range of 20% to 25%. Diffuse hypokinesis. The study is   not technically sufficient to allow evaluation of LV diastolic   function. - Aortic valve: Mildly calcified annulus. Trileaflet; mildly   thickened leaflets. Valve area (VTI): 5.27 cm^2. Valve area   (Vmax): 5.67 cm^2. - Mitral valve: Mildly calcified annulus. Mildly thickened leaflets   . There was moderate regurgitation. - Left atrium: The atrium was severely dilated. - Right atrium: The atrium was moderately dilated. - Atrial septum: No defect or patent foramen ovale was identified.   Neuro/Psych PSYCHIATRIC DISORDERS Anxiety Depression Peripheral Neuropathy Restless legs syndrome  Neuromuscular disease    GI/Hepatic Neg liver ROS, hiatal hernia, Controlled,(+)     substance abuse  alcohol use, Cysts in liver   Endo/Other  negative endocrine ROS  Renal/GU CRFRenal diseasePolycystic kidney disease S/P left nephrectomy Dialysis [facility]  negative genitourinary   Musculoskeletal Chronic back pain   Abdominal   Peds  Hematology  (+) anemia , HIV, Coumadin- Last dose 08/25/2017   Anesthesia Other Findings   Reproductive/Obstetrics negative OB ROS                            Anesthesia Physical  Anesthesia Plan  ASA: III  Anesthesia Plan: General   Post-op Pain Management:    Induction: Intravenous  PONV Risk Score and Plan: 2 and Ondansetron and Dexamethasone  Airway Management Planned: LMA  Additional Equipment:   Intra-op Plan:   Post-operative Plan:   Informed Consent: I have reviewed the patients History and Physical, chart, labs and discussed the procedure including the risks, benefits and alternatives for the proposed anesthesia with the patient or authorized representative who has indicated his/her understanding and acceptance.   Dental advisory given  Plan Discussed with: CRNA, Anesthesiologist and Surgeon  Anesthesia Plan Comments:         Anesthesia Quick Evaluation

## 2018-05-14 ENCOUNTER — Telehealth: Payer: Self-pay | Admitting: Vascular Surgery

## 2018-05-14 ENCOUNTER — Encounter: Payer: Self-pay | Admitting: Internal Medicine

## 2018-05-14 ENCOUNTER — Encounter (HOSPITAL_COMMUNITY): Payer: Self-pay | Admitting: Vascular Surgery

## 2018-05-14 DIAGNOSIS — N2581 Secondary hyperparathyroidism of renal origin: Secondary | ICD-10-CM | POA: Diagnosis not present

## 2018-05-14 DIAGNOSIS — N186 End stage renal disease: Secondary | ICD-10-CM | POA: Diagnosis not present

## 2018-05-14 DIAGNOSIS — D631 Anemia in chronic kidney disease: Secondary | ICD-10-CM | POA: Diagnosis not present

## 2018-05-14 DIAGNOSIS — D509 Iron deficiency anemia, unspecified: Secondary | ICD-10-CM | POA: Diagnosis not present

## 2018-05-14 NOTE — Telephone Encounter (Signed)
sch appt spk to pt 06/04/18 3pm p/o PA s/p fistula ligation and resection per stf msg

## 2018-05-14 NOTE — Op Note (Signed)
    OPERATIVE REPORT  DATE OF SURGERY: 05/13/2018  PATIENT: Brian [name], 42 y.o. 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  PHYSICIAN ASSISTANT: Liana Crocker, PA-C  ANESTHESIA: Local with sedation  EBL: 100 ml  Total I/O In: 700 [I.V.:700] Out: 100 [Blood:100]  BLOOD ADMINISTERED: None  DRAINS: None  SPECIMEN: None  COUNTS CORRECT:  YES  PLAN OF CARE: PACU stable  PATIENT DISPOSITION:  PACU - hemodynamically stable  PROCEDURE DETAILS: The patient was placed supine position where the area of the right arm was prepped and draped in usual sterile fashion.  Using local anesthesia incision made over the very large aneurysm at the antecubital space.  This is been long-standing and was completely calcified.  Separate incision was made in the mid upper arm and a third incision was made at the area below the shoulder for the cephalic vein.  He had pain and dilatation throughout the entire venous fistula.  The vein was mobilized circumferentially throughout.  The brachial artery was also exposed proximal distal to the prior anastomosis.  The brachial artery was occluded occluded proximally and distally and the vein was ligated near the shoulder above the aneurysmal segment.  The fistula was opened and the aneurysmal segment was debrided at the brachial anastomosis.  The vein was closed leaving a small cuff of vein on the brachial artery and this was done with a 2 layers of 5-0 Prolene suture.  The aneurysm was resected in its entirety.  The wound was irrigated with saline and hemostasis was obtained with electrocautery.  Wounds were closed with 3-0 Vicryl in the subcutaneous and subcuticular tissue.  Sterile dressing was applied and the patient was transferred to the  recovery room in stable condition   Rosetta Posner, M.D., [facility] 05/14/2018 11:33 AM

## 2018-05-15 DIAGNOSIS — D631 Anemia in chronic kidney disease: Secondary | ICD-10-CM | POA: Diagnosis not present

## 2018-05-15 DIAGNOSIS — N186 End stage renal disease: Secondary | ICD-10-CM | POA: Diagnosis not present

## 2018-05-15 DIAGNOSIS — D509 Iron deficiency anemia, unspecified: Secondary | ICD-10-CM | POA: Diagnosis not present

## 2018-05-15 DIAGNOSIS — N2581 Secondary hyperparathyroidism of renal origin: Secondary | ICD-10-CM | POA: Diagnosis not present

## 2018-05-16 DIAGNOSIS — I120 Hypertensive chronic kidney disease with stage 5 chronic kidney disease or end stage renal disease: Secondary | ICD-10-CM | POA: Diagnosis not present

## 2018-05-16 DIAGNOSIS — Z992 Dependence on renal dialysis: Secondary | ICD-10-CM | POA: Diagnosis not present

## 2018-05-16 DIAGNOSIS — N186 End stage renal disease: Secondary | ICD-10-CM | POA: Diagnosis not present

## 2018-05-18 ENCOUNTER — Telehealth: Payer: Self-pay | Admitting: *Deleted

## 2018-05-18 DIAGNOSIS — N186 End stage renal disease: Secondary | ICD-10-CM | POA: Diagnosis not present

## 2018-05-18 DIAGNOSIS — N2581 Secondary hyperparathyroidism of renal origin: Secondary | ICD-10-CM | POA: Diagnosis not present

## 2018-05-18 DIAGNOSIS — D631 Anemia in chronic kidney disease: Secondary | ICD-10-CM | POA: Diagnosis not present

## 2018-05-18 DIAGNOSIS — D509 Iron deficiency anemia, unspecified: Secondary | ICD-10-CM | POA: Diagnosis not present

## 2018-05-18 NOTE — Telephone Encounter (Signed)
Call c/o significant swelling at right shoulder and moderate pain s/p a/v fistula ligation. Requesting to have a check. Appt. With NP given.

## 2018-05-19 ENCOUNTER — Ambulatory Visit (INDEPENDENT_AMBULATORY_CARE_PROVIDER_SITE_OTHER): Payer: Medicare Other | Admitting: Family

## 2018-05-19 ENCOUNTER — Encounter: Payer: Self-pay | Admitting: Internal Medicine

## 2018-05-19 ENCOUNTER — Encounter: Payer: Self-pay | Admitting: Family

## 2018-05-19 VITALS — BP 122/77 | HR 111 | Temp 97.7°F | Resp 16 | Ht 74.0 in | Wt 187.0 lb

## 2018-05-19 DIAGNOSIS — F17200 Nicotine dependence, unspecified, uncomplicated: Secondary | ICD-10-CM

## 2018-05-19 DIAGNOSIS — N186 End stage renal disease: Secondary | ICD-10-CM

## 2018-05-19 DIAGNOSIS — G8918 Other acute postprocedural pain: Secondary | ICD-10-CM

## 2018-05-19 DIAGNOSIS — T82898D Other specified complication of vascular prosthetic devices, implants and grafts, subsequent encounter: Secondary | ICD-10-CM

## 2018-05-19 DIAGNOSIS — Z992 Dependence on renal dialysis: Secondary | ICD-10-CM
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
## 2018-05-19 NOTE — Patient Instructions (Signed)
Steps to Quit Smoking Smoking tobacco can be bad for your health. It can also affect almost every organ in your body. Smoking puts you and people around you at risk for many serious long-lasting (chronic) diseases. Quitting smoking is hard, but it is one of the best things that you can do for your health. It is never too late to quit. What are the benefits of quitting smoking? When you quit smoking, you lower your risk for getting serious diseases and conditions. They can include:  Lung cancer or lung disease.  Heart disease.  Stroke.  Heart attack.  Not being able to have children (infertility).  Weak bones (osteoporosis) and broken bones (fractures).  If you have coughing, wheezing, and shortness of breath, those symptoms may get better when you quit. You may also get sick less often. If you are pregnant, quitting smoking can help to lower your chances of having a baby of low birth weight. What can I do to help me quit smoking? Talk with your doctor about what can help you quit smoking. Some things you can do (strategies) include:  Quitting smoking totally, instead of slowly cutting back how much you smoke over a period of time.  Going to in-person counseling. You are more likely to quit if you go to many counseling sessions.  Using resources and support systems, such as: ? Online chats with a counselor. ? Phone quitlines. ? Printed self-help materials. ? Support groups or group counseling. ? Text messaging programs. ? Mobile phone apps or applications.  Taking medicines. Some of these medicines may have nicotine in them. If you are pregnant or breastfeeding, do not take any medicines to quit smoking unless your doctor says it is okay. Talk with your doctor about counseling or other things that can help you.  Talk with your doctor about using more than one strategy at the same time, such as taking medicines while you are also going to in-person counseling. This can help make  quitting easier. What things can I do to make it easier to quit? Quitting smoking might feel very hard at first, but there is a lot that you can do to make it easier. Take these steps:  Talk to your family and friends. Ask them to support and encourage you.  Call phone quitlines, reach out to support groups, or work with a counselor.  Ask people who smoke to not smoke around you.  Avoid places that make you want (trigger) to smoke, such as: ? Bars. ? Parties. ? Smoke-break areas at work.  Spend time with people who do not smoke.  Lower the stress in your life. Stress can make you want to smoke. Try these things to help your stress: ? Getting regular exercise. ? Deep-breathing exercises. ? Yoga. ? Meditating. ? Doing a body scan. To do this, close your eyes, focus on one area of your body at a time from head to toe, and notice which parts of your body are tense. Try to relax the muscles in those areas.  Download or buy apps on your mobile phone or tablet that can help you stick to your quit plan. There are many free apps, such as QuitGuide from the CDC (Centers for Disease Control and Prevention). You can find more support from smokefree.gov and other websites.  This information is not intended to replace advice given to you by your health care provider. Make sure you discuss any questions you have with your health care provider. Document Released: 09/28/2009 Document   Revised: 07/30/2016 Document Reviewed: 04/18/2015 Elsevier Interactive Patient Education  2018 Elsevier Inc.  

## 2018-05-20 DIAGNOSIS — N2581 Secondary hyperparathyroidism of renal origin: Secondary | ICD-10-CM | POA: Diagnosis not present

## 2018-05-20 DIAGNOSIS — D509 Iron deficiency anemia, unspecified: Secondary | ICD-10-CM | POA: Diagnosis not present

## 2018-05-20 DIAGNOSIS — N186 End stage renal disease: Secondary | ICD-10-CM | POA: Diagnosis not present

## 2018-05-20 DIAGNOSIS — D631 Anemia in chronic kidney disease: Secondary | ICD-10-CM | POA: Diagnosis not present

## 2018-05-22 DIAGNOSIS — N2581 Secondary hyperparathyroidism of renal origin: Secondary | ICD-10-CM | POA: Diagnosis not present

## 2018-05-22 DIAGNOSIS — N186 End stage renal disease: Secondary | ICD-10-CM | POA: Diagnosis not present

## 2018-05-22 DIAGNOSIS — D509 Iron deficiency anemia, unspecified: Secondary | ICD-10-CM | POA: Diagnosis not present

## 2018-05-22 DIAGNOSIS — D631 Anemia in chronic kidney disease: Secondary | ICD-10-CM | POA: Diagnosis not present

## 2018-05-25 ENCOUNTER — Encounter: Payer: Self-pay | Admitting: *Deleted

## 2018-05-25 DIAGNOSIS — D509 Iron deficiency anemia, unspecified: Secondary | ICD-10-CM | POA: Diagnosis not present

## 2018-05-25 DIAGNOSIS — N2581 Secondary hyperparathyroidism of renal origin: Secondary | ICD-10-CM | POA: Diagnosis not present

## 2018-05-25 DIAGNOSIS — D631 Anemia in chronic kidney disease: Secondary | ICD-10-CM | POA: Diagnosis not present

## 2018-05-25 DIAGNOSIS — N186 End stage renal disease: Secondary | ICD-10-CM | POA: Diagnosis not present

## 2018-05-25 NOTE — Progress Notes (Signed)
Letter of pre-procedure instructions faxed to [facility] with Raquel Sarna) for patient. Call to patient with date and time of procedure.

## 2018-05-25 NOTE — Progress Notes (Signed)
Order for Fistulogram from Dr. Posey Pronto and Emilie Rutter [facility] . Low access flow "453".

## 2018-05-27 ENCOUNTER — Other Ambulatory Visit: Payer: Self-pay | Admitting: Internal Medicine

## 2018-05-27 DIAGNOSIS — N186 End stage renal disease: Secondary | ICD-10-CM | POA: Diagnosis not present

## 2018-05-27 DIAGNOSIS — D631 Anemia in chronic kidney disease: Secondary | ICD-10-CM | POA: Diagnosis not present

## 2018-05-27 DIAGNOSIS — N2581 Secondary hyperparathyroidism of renal origin: Secondary | ICD-10-CM | POA: Diagnosis not present

## 2018-05-27 DIAGNOSIS — Z9109 Other allergy status, other than to drugs and biological substances: Secondary | ICD-10-CM

## 2018-05-27 DIAGNOSIS — D509 Iron deficiency anemia, unspecified: Secondary | ICD-10-CM | POA: Diagnosis not present

## 2018-05-29 DIAGNOSIS — D631 Anemia in chronic kidney disease: Secondary | ICD-10-CM | POA: Diagnosis not present

## 2018-05-29 DIAGNOSIS — D509 Iron deficiency anemia, unspecified: Secondary | ICD-10-CM | POA: Diagnosis not present

## 2018-05-29 DIAGNOSIS — N2581 Secondary hyperparathyroidism of renal origin: Secondary | ICD-10-CM | POA: Diagnosis not present

## 2018-05-29 DIAGNOSIS — N186 End stage renal disease: Secondary | ICD-10-CM | POA: Diagnosis not present

## 2018-06-01 ENCOUNTER — Other Ambulatory Visit: Payer: Self-pay | Admitting: *Deleted

## 2018-06-01 DIAGNOSIS — D509 Iron deficiency anemia, unspecified: Secondary | ICD-10-CM | POA: Diagnosis not present

## 2018-06-01 DIAGNOSIS — N2581 Secondary hyperparathyroidism of renal origin: Secondary | ICD-10-CM | POA: Diagnosis not present

## 2018-06-01 DIAGNOSIS — D631 Anemia in chronic kidney disease: Secondary | ICD-10-CM | POA: Diagnosis not present

## 2018-06-01 DIAGNOSIS — N186 End stage renal disease: Secondary | ICD-10-CM | POA: Diagnosis not present

## 2018-06-01 NOTE — Progress Notes (Signed)
Patient instructed to be at [facility] admitting department at 10 am on 06/11/18 for fistulogram. May have boiled egg, toast and water prior to 6 am on day of procedure.NPO except may take Amlodopine with sips of liquids  and inhalers if needed but hold all other medications until after this procedure. Must have a driver for home.Verbalized understanding.

## 2018-06-02 ENCOUNTER — Encounter (HOSPITAL_COMMUNITY): Payer: Self-pay

## 2018-06-02 ENCOUNTER — Ambulatory Visit (HOSPITAL_COMMUNITY): Admit: 2018-06-02 | Payer: Medicare Other | Admitting: Surgery

## 2018-06-02 SURGERY — A/V FISTULAGRAM
Anesthesia: LOCAL

## 2018-06-03 DIAGNOSIS — D631 Anemia in chronic kidney disease: Secondary | ICD-10-CM | POA: Diagnosis not present

## 2018-06-03 DIAGNOSIS — D509 Iron deficiency anemia, unspecified: Secondary | ICD-10-CM | POA: Diagnosis not present

## 2018-06-03 DIAGNOSIS — N186 End stage renal disease: Secondary | ICD-10-CM | POA: Diagnosis not present

## 2018-06-03 DIAGNOSIS — N2581 Secondary hyperparathyroidism of renal origin: Secondary | ICD-10-CM | POA: Diagnosis not present

## 2018-06-05 DIAGNOSIS — N2581 Secondary hyperparathyroidism of renal origin: Secondary | ICD-10-CM | POA: Diagnosis not present

## 2018-06-05 DIAGNOSIS — N186 End stage renal disease: Secondary | ICD-10-CM | POA: Diagnosis not present

## 2018-06-05 DIAGNOSIS — D509 Iron deficiency anemia, unspecified: Secondary | ICD-10-CM | POA: Diagnosis not present

## 2018-06-05 DIAGNOSIS — D631 Anemia in chronic kidney disease: Secondary | ICD-10-CM | POA: Diagnosis not present

## 2018-06-08 DIAGNOSIS — D631 Anemia in chronic kidney disease: Secondary | ICD-10-CM | POA: Diagnosis not present

## 2018-06-08 DIAGNOSIS — N186 End stage renal disease: Secondary | ICD-10-CM | POA: Diagnosis not present

## 2018-06-08 DIAGNOSIS — D509 Iron deficiency anemia, unspecified: Secondary | ICD-10-CM | POA: Diagnosis not present

## 2018-06-08 DIAGNOSIS — N2581 Secondary hyperparathyroidism of renal origin: Secondary | ICD-10-CM | POA: Diagnosis not present

## 2018-06-10 DIAGNOSIS — N186 End stage renal disease: Secondary | ICD-10-CM | POA: Diagnosis not present

## 2018-06-10 DIAGNOSIS — N2581 Secondary hyperparathyroidism of renal origin: Secondary | ICD-10-CM | POA: Diagnosis not present

## 2018-06-10 DIAGNOSIS — D509 Iron deficiency anemia, unspecified: Secondary | ICD-10-CM | POA: Diagnosis not present

## 2018-06-10 DIAGNOSIS — D631 Anemia in chronic kidney disease: Secondary | ICD-10-CM | POA: Diagnosis not present

## 2018-06-11 ENCOUNTER — Ambulatory Visit (HOSPITAL_COMMUNITY): Admission: RE | Admit: 2018-06-11 | Payer: Medicare Other | Source: Ambulatory Visit | Admitting: Vascular Surgery

## 2018-06-11 ENCOUNTER — Encounter (HOSPITAL_COMMUNITY): Admission: RE | Payer: Self-pay | Source: Ambulatory Visit

## 2018-06-11 SURGERY — A/V FISTULAGRAM
Anesthesia: LOCAL

## 2018-06-12 DIAGNOSIS — D631 Anemia in chronic kidney disease: Secondary | ICD-10-CM | POA: Diagnosis not present

## 2018-06-12 DIAGNOSIS — N186 End stage renal disease: Secondary | ICD-10-CM | POA: Diagnosis not present

## 2018-06-12 DIAGNOSIS — D509 Iron deficiency anemia, unspecified: Secondary | ICD-10-CM | POA: Diagnosis not present

## 2018-06-12 DIAGNOSIS — N2581 Secondary hyperparathyroidism of renal origin: Secondary | ICD-10-CM | POA: Diagnosis not present

## 2018-06-15 DIAGNOSIS — N186 End stage renal disease: Secondary | ICD-10-CM | POA: Diagnosis not present

## 2018-06-15 DIAGNOSIS — I120 Hypertensive chronic kidney disease with stage 5 chronic kidney disease or end stage renal disease: Secondary | ICD-10-CM | POA: Diagnosis not present

## 2018-06-15 DIAGNOSIS — Z992 Dependence on renal dialysis: Secondary | ICD-10-CM | POA: Diagnosis not present

## 2018-06-16 DIAGNOSIS — N186 End stage renal disease: Secondary | ICD-10-CM | POA: Diagnosis not present

## 2018-06-16 DIAGNOSIS — D509 Iron deficiency anemia, unspecified: Secondary | ICD-10-CM | POA: Diagnosis not present

## 2018-06-16 DIAGNOSIS — D631 Anemia in chronic kidney disease: Secondary | ICD-10-CM | POA: Diagnosis not present

## 2018-06-16 DIAGNOSIS — N2581 Secondary hyperparathyroidism of renal origin: Secondary | ICD-10-CM | POA: Diagnosis not present

## 2018-06-17 DIAGNOSIS — D631 Anemia in chronic kidney disease: Secondary | ICD-10-CM | POA: Diagnosis not present

## 2018-06-17 DIAGNOSIS — D509 Iron deficiency anemia, unspecified: Secondary | ICD-10-CM | POA: Diagnosis not present

## 2018-06-17 DIAGNOSIS — N2581 Secondary hyperparathyroidism of renal origin: Secondary | ICD-10-CM | POA: Diagnosis not present

## 2018-06-17 DIAGNOSIS — N186 End stage renal disease: Secondary | ICD-10-CM | POA: Diagnosis not present

## 2018-06-19 DIAGNOSIS — D631 Anemia in chronic kidney disease: Secondary | ICD-10-CM | POA: Diagnosis not present

## 2018-06-19 DIAGNOSIS — N186 End stage renal disease: Secondary | ICD-10-CM | POA: Diagnosis not present

## 2018-06-19 DIAGNOSIS — N2581 Secondary hyperparathyroidism of renal origin: Secondary | ICD-10-CM | POA: Diagnosis not present

## 2018-06-19 DIAGNOSIS — D509 Iron deficiency anemia, unspecified: Secondary | ICD-10-CM | POA: Diagnosis not present

## 2018-06-22 ENCOUNTER — Other Ambulatory Visit: Payer: Self-pay | Admitting: Internal Medicine

## 2018-06-22 DIAGNOSIS — N2581 Secondary hyperparathyroidism of renal origin: Secondary | ICD-10-CM | POA: Diagnosis not present

## 2018-06-22 DIAGNOSIS — D509 Iron deficiency anemia, unspecified: Secondary | ICD-10-CM | POA: Diagnosis not present

## 2018-06-22 DIAGNOSIS — D631 Anemia in chronic kidney disease: Secondary | ICD-10-CM | POA: Diagnosis not present

## 2018-06-22 DIAGNOSIS — N186 End stage renal disease: Secondary | ICD-10-CM | POA: Diagnosis not present

## 2018-06-22 MED ORDER — GABAPENTIN 100 MG PO CAPS: 100.0000 mg | ORAL_CAPSULE | Freq: Every day | ORAL | 6 refills | Status: DC

## 2018-06-24 DIAGNOSIS — D509 Iron deficiency anemia, unspecified: Secondary | ICD-10-CM | POA: Diagnosis not present

## 2018-06-24 DIAGNOSIS — N186 End stage renal disease: Secondary | ICD-10-CM | POA: Diagnosis not present

## 2018-06-24 DIAGNOSIS — D631 Anemia in chronic kidney disease: Secondary | ICD-10-CM | POA: Diagnosis not present

## 2018-06-24 DIAGNOSIS — N2581 Secondary hyperparathyroidism of renal origin: Secondary | ICD-10-CM | POA: Diagnosis not present

## 2018-06-26 DIAGNOSIS — D509 Iron deficiency anemia, unspecified: Secondary | ICD-10-CM | POA: Diagnosis not present

## 2018-06-26 DIAGNOSIS — D631 Anemia in chronic kidney disease: Secondary | ICD-10-CM | POA: Diagnosis not present

## 2018-06-26 DIAGNOSIS — N186 End stage renal disease: Secondary | ICD-10-CM | POA: Diagnosis not present

## 2018-06-26 DIAGNOSIS — N2581 Secondary hyperparathyroidism of renal origin: Secondary | ICD-10-CM | POA: Diagnosis not present

## 2018-06-27 ENCOUNTER — Ambulatory Visit (HOSPITAL_BASED_OUTPATIENT_CLINIC_OR_DEPARTMENT_OTHER): Payer: Medicare Other | Attending: Internal Medicine

## 2018-06-29 ENCOUNTER — Other Ambulatory Visit: Payer: Self-pay | Admitting: *Deleted

## 2018-06-29 ENCOUNTER — Telehealth: Payer: Self-pay | Admitting: *Deleted

## 2018-06-29 DIAGNOSIS — D631 Anemia in chronic kidney disease: Secondary | ICD-10-CM | POA: Diagnosis not present

## 2018-06-29 DIAGNOSIS — N186 End stage renal disease: Secondary | ICD-10-CM | POA: Diagnosis not present

## 2018-06-29 DIAGNOSIS — N2581 Secondary hyperparathyroidism of renal origin: Secondary | ICD-10-CM | POA: Diagnosis not present

## 2018-06-29 DIAGNOSIS — D509 Iron deficiency anemia, unspecified: Secondary | ICD-10-CM | POA: Diagnosis not present

## 2018-06-29 NOTE — Telephone Encounter (Signed)
Order for Fistulogram from Dr. Posey Pronto due to "LOW ACCESS FLOW 453"  Call from patient to re-schedule Fistulogram with Dr. Bridgett Larsson. Arrive at [facility] at 5:30 am on 07/09/18(patient's Choice). NPO past MN except for Amlodipine with sips of water and inhalers if needed. Must have a driver for home. This is 3rd re-schedule for this patient.

## 2018-07-01 DIAGNOSIS — N2581 Secondary hyperparathyroidism of renal origin: Secondary | ICD-10-CM | POA: Diagnosis not present

## 2018-07-01 DIAGNOSIS — D631 Anemia in chronic kidney disease: Secondary | ICD-10-CM | POA: Diagnosis not present

## 2018-07-01 DIAGNOSIS — N186 End stage renal disease: Secondary | ICD-10-CM | POA: Diagnosis not present

## 2018-07-01 DIAGNOSIS — D509 Iron deficiency anemia, unspecified: Secondary | ICD-10-CM | POA: Diagnosis not present

## 2018-07-03 DIAGNOSIS — N2581 Secondary hyperparathyroidism of renal origin: Secondary | ICD-10-CM | POA: Diagnosis not present

## 2018-07-03 DIAGNOSIS — N186 End stage renal disease: Secondary | ICD-10-CM | POA: Diagnosis not present

## 2018-07-03 DIAGNOSIS — D509 Iron deficiency anemia, unspecified: Secondary | ICD-10-CM | POA: Diagnosis not present

## 2018-07-03 DIAGNOSIS — D631 Anemia in chronic kidney disease: Secondary | ICD-10-CM | POA: Diagnosis not present

## 2018-07-06 DIAGNOSIS — D631 Anemia in chronic kidney disease: Secondary | ICD-10-CM | POA: Diagnosis not present

## 2018-07-06 DIAGNOSIS — N2581 Secondary hyperparathyroidism of renal origin: Secondary | ICD-10-CM | POA: Diagnosis not present

## 2018-07-06 DIAGNOSIS — N186 End stage renal disease: Secondary | ICD-10-CM | POA: Diagnosis not present

## 2018-07-06 DIAGNOSIS — D509 Iron deficiency anemia, unspecified: Secondary | ICD-10-CM | POA: Diagnosis not present

## 2018-07-08 DIAGNOSIS — N186 End stage renal disease: Secondary | ICD-10-CM | POA: Diagnosis not present

## 2018-07-08 DIAGNOSIS — N2581 Secondary hyperparathyroidism of renal origin: Secondary | ICD-10-CM | POA: Diagnosis not present

## 2018-07-08 DIAGNOSIS — D509 Iron deficiency anemia, unspecified: Secondary | ICD-10-CM | POA: Diagnosis not present

## 2018-07-08 DIAGNOSIS — D631 Anemia in chronic kidney disease: Secondary | ICD-10-CM | POA: Diagnosis not present

## 2018-07-09 ENCOUNTER — Ambulatory Visit (HOSPITAL_COMMUNITY)
Admission: RE | Admit: 2018-07-09 | Discharge: 2018-07-09 | Disposition: A | Discharge status: Home / Self Care | Payer: Medicare Other | Source: Ambulatory Visit | Attending: Vascular Surgery | Admitting: Vascular Surgery

## 2018-07-09 ENCOUNTER — Other Ambulatory Visit: Payer: Self-pay

## 2018-07-09 ENCOUNTER — Telehealth: Payer: Self-pay | Admitting: Vascular Surgery

## 2018-07-09 ENCOUNTER — Encounter (HOSPITAL_COMMUNITY): Payer: Self-pay | Admitting: Vascular Surgery

## 2018-07-09 ENCOUNTER — Ambulatory Visit (HOSPITAL_COMMUNITY)
Admission: RE | Disposition: A | Discharge status: Home / Self Care | Payer: Self-pay | Source: Ambulatory Visit | Attending: Vascular Surgery

## 2018-07-09 DIAGNOSIS — Z992 Dependence on renal dialysis: Secondary | ICD-10-CM | POA: Insufficient documentation

## 2018-07-09 DIAGNOSIS — G2581 Restless legs syndrome: Secondary | ICD-10-CM | POA: Diagnosis not present

## 2018-07-09 DIAGNOSIS — G8929 Other chronic pain: Secondary | ICD-10-CM | POA: Insufficient documentation

## 2018-07-09 DIAGNOSIS — Q613 Polycystic kidney, unspecified: Secondary | ICD-10-CM | POA: Insufficient documentation

## 2018-07-09 DIAGNOSIS — M545 Low back pain: Secondary | ICD-10-CM | POA: Diagnosis not present

## 2018-07-09 DIAGNOSIS — F1210 Cannabis abuse, uncomplicated: Secondary | ICD-10-CM | POA: Diagnosis not present

## 2018-07-09 DIAGNOSIS — T82898A Other specified complication of vascular prosthetic devices, implants and grafts, initial encounter: Secondary | ICD-10-CM | POA: Diagnosis not present

## 2018-07-09 DIAGNOSIS — Z833 Family history of diabetes mellitus: Secondary | ICD-10-CM | POA: Insufficient documentation

## 2018-07-09 DIAGNOSIS — N186 End stage renal disease: Secondary | ICD-10-CM | POA: Diagnosis not present

## 2018-07-09 DIAGNOSIS — I120 Hypertensive chronic kidney disease with stage 5 chronic kidney disease or end stage renal disease: Secondary | ICD-10-CM | POA: Insufficient documentation

## 2018-07-09 DIAGNOSIS — Y832 Surgical operation with anastomosis, bypass or graft as the cause of abnormal reaction of the patient, or of later complication, without mention of misadventure at the time of the procedure: Secondary | ICD-10-CM | POA: Diagnosis not present

## 2018-07-09 DIAGNOSIS — Z87442 Personal history of urinary calculi: Secondary | ICD-10-CM | POA: Insufficient documentation

## 2018-07-09 DIAGNOSIS — Z8249 Family history of ischemic heart disease and other diseases of the circulatory system: Secondary | ICD-10-CM | POA: Insufficient documentation

## 2018-07-09 DIAGNOSIS — Z9889 Other specified postprocedural states: Secondary | ICD-10-CM | POA: Insufficient documentation

## 2018-07-09 DIAGNOSIS — I712 Thoracic aortic aneurysm, without rupture: Secondary | ICD-10-CM | POA: Diagnosis not present

## 2018-07-09 DIAGNOSIS — Z86711 Personal history of pulmonary embolism: Secondary | ICD-10-CM | POA: Insufficient documentation

## 2018-07-09 DIAGNOSIS — J449 Chronic obstructive pulmonary disease, unspecified: Secondary | ICD-10-CM | POA: Insufficient documentation

## 2018-07-09 DIAGNOSIS — Z86718 Personal history of other venous thrombosis and embolism: Secondary | ICD-10-CM | POA: Insufficient documentation

## 2018-07-09 DIAGNOSIS — G629 Polyneuropathy, unspecified: Secondary | ICD-10-CM | POA: Insufficient documentation

## 2018-07-09 DIAGNOSIS — K219 Gastro-esophageal reflux disease without esophagitis: Secondary | ICD-10-CM | POA: Insufficient documentation

## 2018-07-09 DIAGNOSIS — B20 Human immunodeficiency virus [HIV] disease: Secondary | ICD-10-CM | POA: Insufficient documentation

## 2018-07-09 DIAGNOSIS — Z905 Acquired absence of kidney: Secondary | ICD-10-CM | POA: Diagnosis not present

## 2018-07-09 DIAGNOSIS — F17210 Nicotine dependence, cigarettes, uncomplicated: Secondary | ICD-10-CM | POA: Insufficient documentation

## 2018-07-09 DIAGNOSIS — Z9109 Other allergy status, other than to drugs and biological substances: Secondary | ICD-10-CM | POA: Diagnosis not present

## 2018-07-09 HISTORY — PX: A/V FISTULAGRAM: CATH118298

## 2018-07-09 HISTORY — PX: PERIPHERAL VASCULAR BALLOON ANGIOPLASTY: CATH118281

## 2018-07-09 LAB — POCT I-STAT, CHEM 8
BUN: 30 mg/dL — ABNORMAL HIGH (ref 6–20)
CALCIUM ION: 1.13 mmol/L — AB (ref 1.15–1.40)
CHLORIDE: 94 mmol/L — AB (ref 98–111)
Creatinine, Ser: 7.8 mg/dL — ABNORMAL HIGH (ref 0.61–1.24)
Glucose, Bld: 94 mg/dL (ref 70–99)
HEMATOCRIT: 34 % — AB (ref 39.0–52.0)
Hemoglobin: 11.6 g/dL — ABNORMAL LOW (ref 13.0–17.0)
Potassium: 4.1 mmol/L (ref 3.5–5.1)
SODIUM: 133 mmol/L — AB (ref 135–145)
TCO2: 29 mmol/L (ref 22–32)

## 2018-07-09 SURGERY — A/V FISTULAGRAM
Anesthesia: LOCAL | Laterality: Left

## 2018-07-09 MED ORDER — LIDOCAINE HCL (PF) 1 % IJ SOLN
INTRAMUSCULAR | Status: DC | PRN
  Administered 2018-07-09: 2 mL

## 2018-07-09 MED ORDER — NITROGLYCERIN 1 MG/10 ML FOR IR/CATH LAB
INTRA_ARTERIAL | Status: DC | PRN
  Administered 2018-07-09: 200 ug

## 2018-07-09 MED ORDER — HYDRALAZINE HCL 20 MG/ML IJ SOLN: 5.0000 mg | INTRAMUSCULAR | Status: DC | PRN

## 2018-07-09 MED ORDER — SODIUM CHLORIDE 0.9% FLUSH: 3.0000 mL | INTRAVENOUS | Status: DC | PRN

## 2018-07-09 MED ORDER — ACETAMINOPHEN 325 MG PO TABS: 650.0000 mg | ORAL_TABLET | ORAL | Status: DC | PRN

## 2018-07-09 MED ORDER — NITROGLYCERIN 1 MG/10 ML FOR IR/CATH LAB
INTRA_ARTERIAL | Status: AC
  Filled 2018-07-09: qty 10

## 2018-07-09 MED ORDER — LABETALOL HCL 5 MG/ML IV SOLN: 10.0000 mg | INTRAVENOUS | Status: DC | PRN

## 2018-07-09 MED ORDER — HEPARIN SODIUM (PORCINE) 1000 UNIT/ML IJ SOLN
INTRAMUSCULAR | Status: AC
  Filled 2018-07-09: qty 1

## 2018-07-09 MED ORDER — SODIUM CHLORIDE 0.9% FLUSH: 3.0000 mL | Freq: Two times a day (BID) | INTRAVENOUS | Status: DC

## 2018-07-09 MED ORDER — FENTANYL CITRATE (PF) 100 MCG/2ML IJ SOLN
INTRAMUSCULAR | Status: AC
  Filled 2018-07-09: qty 2

## 2018-07-09 MED ORDER — FENTANYL CITRATE (PF) 100 MCG/2ML IJ SOLN
INTRAMUSCULAR | Status: DC | PRN
  Administered 2018-07-09: 50 ug via INTRAVENOUS

## 2018-07-09 MED ORDER — IODIXANOL 320 MG/ML IV SOLN
INTRAVENOUS | Status: DC | PRN
  Administered 2018-07-09: 70 mL via INTRAVENOUS

## 2018-07-09 MED ORDER — ONDANSETRON HCL 4 MG/2ML IJ SOLN: 4.0000 mg | Freq: Four times a day (QID) | INTRAMUSCULAR | Status: DC | PRN

## 2018-07-09 MED ORDER — HEPARIN SODIUM (PORCINE) 1000 UNIT/ML IJ SOLN
INTRAMUSCULAR | Status: DC | PRN
  Administered 2018-07-09: 3000 [IU] via INTRAVENOUS

## 2018-07-09 MED ORDER — LIDOCAINE HCL (PF) 1 % IJ SOLN
INTRAMUSCULAR | Status: AC
  Filled 2018-07-09: qty 30

## 2018-07-09 MED ORDER — SODIUM CHLORIDE 0.9 % IV SOLN: 250.0000 mL | INTRAVENOUS | Status: DC | PRN

## 2018-07-09 MED ORDER — HEPARIN (PORCINE) IN NACL 1000-0.9 UT/500ML-% IV SOLN
INTRAVENOUS | Status: AC
  Filled 2018-07-09: qty 500

## 2018-07-09 SURGICAL SUPPLY — 15 items
BAG SNAP BAND KOVER 36X36 (MISCELLANEOUS) ×2 IMPLANT
BALLN MUSTANG 6.0X100X75 (BALLOONS) ×2
BALLOON MUSTANG 6.0X100X75 (BALLOONS) IMPLANT
COVER DOME SNAP 22 D (MISCELLANEOUS) ×2 IMPLANT
KIT ENCORE 26 ADVANTAGE (KITS) ×1 IMPLANT
KIT MICROPUNCTURE NIT STIFF (SHEATH) ×2 IMPLANT
PROTECTION STATION PRESSURIZED (MISCELLANEOUS) ×2
SHEATH PINNACLE 7F 10CM (SHEATH) ×1 IMPLANT
SHEATH PINNACLE R/O II 6F 4CM (SHEATH) ×2 IMPLANT
SHEATH PROBE COVER 6X72 (BAG) ×1 IMPLANT
STATION PROTECTION PRESSURIZED (MISCELLANEOUS) ×1 IMPLANT
STOPCOCK MORSE 400PSI 3WAY (MISCELLANEOUS) ×2 IMPLANT
TRAY PV CATH (CUSTOM PROCEDURE TRAY) ×2 IMPLANT
TUBING CIL FLEX 10 FLL-RA (TUBING) ×2 IMPLANT
WIRE BENTSON .035X145CM (WIRE) ×1 IMPLANT

## 2018-07-09 NOTE — Op Note (Signed)
OPERATIVE NOTE   PROCEDURE: 1.  left forearm basilic vein transposition cannulation under ultrasound guidance 2.  left arm shuntogram 3.  Venoplasty of basilic vein (6 mm x 10 mm) 4.  Intra-arterial administration of nitroglycerin  PRE-OPERATIVE DIAGNOSIS: left forearm basilic vein transposition with poor flow rates   POST-OPERATIVE DIAGNOSIS: same as above   SURGEON: Adele Barthel, MD  ANESTHESIA: local  ESTIMATED BLOOD LOSS: 50 cc  FINDING(S): 1. Sclerotic distal fistula in perianastomotic segment: treated with nitroglycerin without appreciable change: possible secondary to cannulation 2. Frozen valve in mid-segment: does not appear on fistulogram, but appreciated during balloon inflation 3. Resolution of pulsatile segment after angioplasty  SPECIMEN(S):  None  CONTRAST: 40 cc   INDICATIONS: Brian [name] is a 42 y.o. male who presents with left forearm basilic vein transposition with poor flow rates.  The patient is scheduled for left arm shuntogram and possible intervention.  The patient is aware of that the risks of an angiographic procedure include but are not limited to: bleeding, infection, access site complications, thrombosis of access, renal failure, embolization, rupture of vessel, dissection, arteriovenous fistula, possible need for emergent surgical intervention, possible need for surgical procedures to treat the patient's pathology, anaphylactic reaction to contrast, and stroke and death.  The patient is aware of the risks of the procedure and elects to proceed forward.   DESCRIPTION: After full informed written consent was obtained, the patient was brought back to the angiography suite and placed supine upon the angiography table.  The patient was connected to monitoring equipment.  The left forearm was prepped and draped in the standard fashion for a percutaneous access intervention.  Under ultrasound guidance, the left forearm basilic vein transposition was  cannulated with a micropuncture needle with some difficult due to thickened skin.  The microwire was advanced into the fistula and the needle was exchanged for the a microsheath, which was lodged 2 cm into the access.  The wire was removed and the sheath was connected to the IV extension tubing.  Hand injections were completed to image the access from the cannulation site up the right atrium.  A reflux injection was completed with inflation of blood pressure cuff in upper arm and injection of contrast.  This demonstrated a severely stenotic perianastomotic segment, which was NOT consisted with the B-Mode images of the distal fistula, suggesting possible spasm for cannulation.  I injected 200 mcg of nitroglycerin while compressing the fistula to reflux the nitroglycerin into the distal segment and artery.  Based on the images, this patient will need: venoplasty basilic vein.  The patient was given 3000 units of Heparin intravenously to obtain some anticoagulation.  A Bentson wire was advanced into the subclavian vein and the sheath was exchanged for a short 6-Fr sheath.    Based on the imaging, a 6 mm x 100 mm angioplasty balloon was selected.  The balloon was inflated through the forearm segment of the basilic vein at 10 atm for 2 minutes.  In the process of inflation two waists were imaged: one in the antecubital segment and another frozen valve in the mid-segment.  Both resolved with continued inflation.  On completion imaging, I could not see any appreciable change in the caliber of the fistula but the pulsatile segment had resolved and the thrill was greatly augmented.  Based on the completion imaging, no further intervention is necessary.  If the patient's flow rates continue to be poor, I would surgically revised the anastomosis.  The wire  and balloon were removed from the sheath.  A 4-0 Monocryl purse-string suture was sewn around the sheath.  The sheath was removed while tying down the suture.  A  sterile bandage was applied to the puncture site.   COMPLICATIONS: none  CONDITION: stable   Adele Barthel, MD, [facility] [place] Office: [phone] Pager: [phone]  07/09/2018 8:35 AM
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
## 2018-07-09 NOTE — Discharge Instructions (Signed)

## 2018-07-09 NOTE — Telephone Encounter (Signed)
Sch appt spk to pt 08/11/18 3pm p/o PA

## 2018-07-10 DIAGNOSIS — D509 Iron deficiency anemia, unspecified: Secondary | ICD-10-CM | POA: Diagnosis not present

## 2018-07-10 DIAGNOSIS — N2581 Secondary hyperparathyroidism of renal origin: Secondary | ICD-10-CM | POA: Diagnosis not present

## 2018-07-10 DIAGNOSIS — N186 End stage renal disease: Secondary | ICD-10-CM | POA: Diagnosis not present

## 2018-07-10 DIAGNOSIS — D631 Anemia in chronic kidney disease: Secondary | ICD-10-CM | POA: Diagnosis not present

## 2018-07-10 MED FILL — Heparin Sod (Porcine)-NaCl IV Soln 1000 Unit/500ML-0.9%: INTRAVENOUS | Qty: 500 | Status: AC

## 2018-07-13 ENCOUNTER — Encounter (HOSPITAL_COMMUNITY): Payer: Self-pay | Admitting: Vascular Surgery

## 2018-07-13 DIAGNOSIS — D631 Anemia in chronic kidney disease: Secondary | ICD-10-CM | POA: Diagnosis not present

## 2018-07-13 DIAGNOSIS — D509 Iron deficiency anemia, unspecified: Secondary | ICD-10-CM | POA: Diagnosis not present

## 2018-07-13 DIAGNOSIS — N2581 Secondary hyperparathyroidism of renal origin: Secondary | ICD-10-CM | POA: Diagnosis not present

## 2018-07-13 DIAGNOSIS — N186 End stage renal disease: Secondary | ICD-10-CM | POA: Diagnosis not present

## 2018-07-16 DIAGNOSIS — N186 End stage renal disease: Secondary | ICD-10-CM | POA: Diagnosis not present

## 2018-07-16 DIAGNOSIS — Z992 Dependence on renal dialysis: Secondary | ICD-10-CM | POA: Diagnosis not present

## 2018-07-16 DIAGNOSIS — I120 Hypertensive chronic kidney disease with stage 5 chronic kidney disease or end stage renal disease: Secondary | ICD-10-CM | POA: Diagnosis not present

## 2018-07-17 DIAGNOSIS — D631 Anemia in chronic kidney disease: Secondary | ICD-10-CM | POA: Diagnosis not present

## 2018-07-17 DIAGNOSIS — D509 Iron deficiency anemia, unspecified: Secondary | ICD-10-CM | POA: Diagnosis not present

## 2018-07-17 DIAGNOSIS — N2581 Secondary hyperparathyroidism of renal origin: Secondary | ICD-10-CM | POA: Diagnosis not present

## 2018-07-17 DIAGNOSIS — N186 End stage renal disease: Secondary | ICD-10-CM | POA: Diagnosis not present

## 2018-07-20 DIAGNOSIS — D509 Iron deficiency anemia, unspecified: Secondary | ICD-10-CM | POA: Diagnosis not present

## 2018-07-20 DIAGNOSIS — D631 Anemia in chronic kidney disease: Secondary | ICD-10-CM | POA: Diagnosis not present

## 2018-07-20 DIAGNOSIS — N2581 Secondary hyperparathyroidism of renal origin: Secondary | ICD-10-CM | POA: Diagnosis not present

## 2018-07-20 DIAGNOSIS — N186 End stage renal disease: Secondary | ICD-10-CM | POA: Diagnosis not present

## 2018-07-21 ENCOUNTER — Other Ambulatory Visit: Payer: Self-pay

## 2018-07-21 ENCOUNTER — Encounter: Payer: Self-pay | Admitting: Internal Medicine

## 2018-07-21 ENCOUNTER — Ambulatory Visit (INDEPENDENT_AMBULATORY_CARE_PROVIDER_SITE_OTHER): Payer: Medicare Other | Admitting: Internal Medicine

## 2018-07-21 DIAGNOSIS — Z86718 Personal history of other venous thrombosis and embolism: Secondary | ICD-10-CM | POA: Diagnosis not present

## 2018-07-21 DIAGNOSIS — Z720 Tobacco use: Secondary | ICD-10-CM

## 2018-07-21 DIAGNOSIS — Z87820 Personal history of traumatic brain injury: Secondary | ICD-10-CM | POA: Diagnosis not present

## 2018-07-21 DIAGNOSIS — I5022 Chronic systolic (congestive) heart failure: Secondary | ICD-10-CM | POA: Diagnosis not present

## 2018-07-21 DIAGNOSIS — Z21 Asymptomatic human immunodeficiency virus [HIV] infection status: Secondary | ICD-10-CM | POA: Diagnosis not present

## 2018-07-21 DIAGNOSIS — G8929 Other chronic pain: Secondary | ICD-10-CM | POA: Diagnosis not present

## 2018-07-21 DIAGNOSIS — Q613 Polycystic kidney, unspecified: Secondary | ICD-10-CM

## 2018-07-21 DIAGNOSIS — Z79899 Other long term (current) drug therapy: Secondary | ICD-10-CM

## 2018-07-21 DIAGNOSIS — I151 Hypertension secondary to other renal disorders: Secondary | ICD-10-CM

## 2018-07-21 DIAGNOSIS — I712 Thoracic aortic aneurysm, without rupture: Secondary | ICD-10-CM | POA: Diagnosis not present

## 2018-07-21 DIAGNOSIS — J449 Chronic obstructive pulmonary disease, unspecified: Secondary | ICD-10-CM

## 2018-07-21 DIAGNOSIS — Z992 Dependence on renal dialysis: Secondary | ICD-10-CM | POA: Diagnosis not present

## 2018-07-21 DIAGNOSIS — Z86711 Personal history of pulmonary embolism: Secondary | ICD-10-CM

## 2018-07-21 DIAGNOSIS — Z79891 Long term (current) use of opiate analgesic: Secondary | ICD-10-CM

## 2018-07-21 DIAGNOSIS — I132 Hypertensive heart and chronic kidney disease with heart failure and with stage 5 chronic kidney disease, or end stage renal disease: Secondary | ICD-10-CM | POA: Diagnosis not present

## 2018-07-21 DIAGNOSIS — N186 End stage renal disease: Secondary | ICD-10-CM | POA: Diagnosis not present

## 2018-07-21 DIAGNOSIS — N2889 Other specified disorders of kidney and ureter: Secondary | ICD-10-CM

## 2018-07-21 MED ORDER — DRONABINOL 5 MG PO CAPS: 5.0000 mg | ORAL_CAPSULE | Freq: Every day | ORAL | 3 refills | Status: DC

## 2018-07-21 MED ORDER — OXYCODONE-ACETAMINOPHEN 10-325 MG PO TABS: 1.0000 | ORAL_TABLET | Freq: Four times a day (QID) | ORAL | 0 refills | Status: DC | PRN

## 2018-07-21 NOTE — Patient Instructions (Addendum)
Thank you for coming to the clinic today. It was a pleasure to see you.   For your chronic pain, I have refilled your prescription   I will reach out to Dr. Maudie Mercury and someone will be in contact with you about restarting the coumadin   FOLLOW-UP INSTRUCTIONS When: 3 months with Dr. Hetty Ely  For: chronic pain  What to bring: all of your medication bottles   Please call our clinic if you have any questions or concerns, we may be able to help and keep you from a long and expensive emergency room wait. Our clinic and after hours phone number is [phone], the best time to call is Monday through Friday 9 am to 4 pm but there is always someone available 24/7 if you have an emergency. If you need medication refills please notify your pharmacy one week in advance and they will send [place] a request.

## 2018-07-21 NOTE — Progress Notes (Signed)
CC: follow up for chronic pain   HPI:  [name] is a 42 y.o. with PMH polycystic kidney disease and ESRD on dialysis, COPD. Tobacco use, chronic pain related to PCKD, HIV, history of unprovoked PE and DVT, chronic systolic heart failure, and thoracic aortic aneurysmwho presents for hospital follow up after a syncopal event. Please see the assessment and plans for the status of the patient chronic medical problems.   Review of Systems:  Refer to history of present illness and assessment and plans for pertinent review of systems, all others reviewed and negative   Physical Exam:  Vitals:   07/21/18 1424  BP: 137/89  Pulse: (!) 105  Temp: 98.1 F (36.7 C)  TempSrc: Oral  SpO2: 94%  Weight: 188 lb 12.8 oz (85.6 kg)  Height: 6\' 2"  (1.88 m)   General: well appearing, no acute distress  Cardiac: regular rate and rhythm, no murmur, no lower extremity edema or calf tenderness  Pulm: normal work of breathing, lungs clear to ausculation  GI: the abdomen is soft and non tender, there is a bulky abdominal mass palpable over the right flank  Skin: there is a new scar over the right upper extremity, there are no rashes over the exposed skin of the arms and legs   Assessment & Plan:   Chronic opioid use  Brian [name] is on chronic opioid therapy for chronic pain. The date of the controlled substances contract is referenced in the [place] and / or the overview. Date of pain contract was 07/2017. As part of the treatment plan, the [place] controlled substance database is checked at least twice yearly and the database results are not appropriate. I have last reviewed the results today.   The last UDS was on 01/2018 and results are as expected. Patient needs at least a yearly UDS.   The patient is on Percocet strength 10, 90 per 30 days. Adjunctive treatment includes gabapentin. This regimen allows Brian [name] to function and does not cause excessive sedation or other side effects.  The  benefits of continuing opioid therapy do not outweigh the risks because controlled substances are being obtained from outside providers".  Interventions today include: Refills - 3 paper Rx printed, dose reduced by 10% to  - follow up of drug database is again innapropriate, a 30 day prescription for valium was filled on 07/06/18. Brian [name] was aware that this was a violation of his pain contract and understands that we will need to initiate a taper of his percocet prescription. There have been red flags which are outlined in my office visit 04/14/2018 - Gabapentin max dose for current creatinine clearance 15 is 50-150 mg daily   Hypertension  Blood pressure well controlled. He is not taking amlodipine. He reports good dialysis compliance.   History of unprovoked VTE  Brian [name] request to restart heparin for his risk of unprovoked VTE. He recently had multiple subarachnoid hemorrhage from sleep walking of unknown etiology. Today he explains that he was taking his gabapentin at a dose above the prescribed dose and when this was corrected he believes he stopped sleep walking in the setting of INR 3.9. He had a difficult time attending INR checks, this is likely because of his busy schedule with hemodialysis. I told Brian [name] that I would reach out to our clinic pharmacist to discuss the safety of restarting this medication and whether our coumadin clinic would feel comfortable with resuming his coumadin.   See Encounters Brian  for problem based charting.  Patient discussed with Dr. Dareen Piano

## 2018-07-22 DIAGNOSIS — D509 Iron deficiency anemia, unspecified: Secondary | ICD-10-CM | POA: Diagnosis not present

## 2018-07-22 DIAGNOSIS — N186 End stage renal disease: Secondary | ICD-10-CM | POA: Diagnosis not present

## 2018-07-22 DIAGNOSIS — D631 Anemia in chronic kidney disease: Secondary | ICD-10-CM | POA: Diagnosis not present

## 2018-07-22 DIAGNOSIS — N2581 Secondary hyperparathyroidism of renal origin: Secondary | ICD-10-CM | POA: Diagnosis not present

## 2018-07-24 ENCOUNTER — Encounter: Payer: Self-pay | Admitting: Internal Medicine

## 2018-07-24 DIAGNOSIS — N186 End stage renal disease: Secondary | ICD-10-CM | POA: Diagnosis not present

## 2018-07-24 DIAGNOSIS — N2581 Secondary hyperparathyroidism of renal origin: Secondary | ICD-10-CM | POA: Diagnosis not present

## 2018-07-24 DIAGNOSIS — D631 Anemia in chronic kidney disease: Secondary | ICD-10-CM | POA: Diagnosis not present

## 2018-07-24 DIAGNOSIS — D509 Iron deficiency anemia, unspecified: Secondary | ICD-10-CM | POA: Diagnosis not present

## 2018-07-24 NOTE — Assessment & Plan Note (Signed)
Brian [name] is on chronic opioid therapy for chronic pain. The date of the controlled substances contract is referenced in the [place] and / or the overview. Date of pain contract was 07/2017. As part of the treatment plan, the [place] controlled substance database is checked at least twice yearly and the database results are not appropriate. I have last reviewed the results today.   The last UDS was on 01/2018 and results are as expected. Patient needs at least a yearly UDS.   The patient is on Percocet strength 10, 90 per 30 days. Adjunctive treatment includes gabapentin. This regimen allows Brian [name] to function and does not cause excessive sedation or other side effects.  The benefits of continuing opioid therapy do not outweigh the risks because controlled substances are being obtained from outside providers".  Interventions today include: Refills - 3 paper Rx printed, dose reduced by 10% to  - follow up of drug database is again innapropriate, a 30 day prescription for valium was filled on 07/06/18. Brian [name] was aware that this was a violation of his pain contract and understands that we will need to initiate a taper of his percocet prescription. There have been red flags which are outlined in my office visit 04/14/2018 - Gabapentin max dose for current creatinine clearance 15 is 50-150 mg daily

## 2018-07-24 NOTE — Assessment & Plan Note (Signed)
Brian [name] request to restart heparin for his risk of unprovoked VTE. He recently had multiple subarachnoid hemorrhage from sleep walking of unknown etiology. Today he explains that he was taking his gabapentin at a dose above the prescribed dose and when this was corrected he believes he stopped sleep walking in the setting of INR 3.9. He had a difficult time attending INR checks, this is likely because of his busy schedule with hemodialysis. I told Brian [name] that I would reach out to our clinic pharmacist to discuss the safety of restarting this medication and whether our coumadin clinic would feel comfortable with resuming his coumadin.

## 2018-07-24 NOTE — Assessment & Plan Note (Signed)
Blood pressure well controlled. He is not taking amlodipine. He reports good dialysis compliance.

## 2018-07-27 DIAGNOSIS — D509 Iron deficiency anemia, unspecified: Secondary | ICD-10-CM | POA: Diagnosis not present

## 2018-07-27 DIAGNOSIS — D631 Anemia in chronic kidney disease: Secondary | ICD-10-CM | POA: Diagnosis not present

## 2018-07-27 DIAGNOSIS — N186 End stage renal disease: Secondary | ICD-10-CM | POA: Diagnosis not present

## 2018-07-27 DIAGNOSIS — N2581 Secondary hyperparathyroidism of renal origin: Secondary | ICD-10-CM | POA: Diagnosis not present

## 2018-07-27 NOTE — Progress Notes (Signed)
Internal Medicine Clinic Attending  Case discussed with Dr. Blum at the time of the visit.  We reviewed the resident's history and exam and pertinent patient test results.  I agree with the assessment, diagnosis, and plan of care documented in the resident's note. 

## 2018-07-29 DIAGNOSIS — N186 End stage renal disease: Secondary | ICD-10-CM | POA: Diagnosis not present

## 2018-07-29 DIAGNOSIS — N2581 Secondary hyperparathyroidism of renal origin: Secondary | ICD-10-CM | POA: Diagnosis not present

## 2018-07-29 DIAGNOSIS — D509 Iron deficiency anemia, unspecified: Secondary | ICD-10-CM | POA: Diagnosis not present

## 2018-07-29 DIAGNOSIS — D631 Anemia in chronic kidney disease: Secondary | ICD-10-CM | POA: Diagnosis not present

## 2018-07-31 DIAGNOSIS — D631 Anemia in chronic kidney disease: Secondary | ICD-10-CM | POA: Diagnosis not present

## 2018-07-31 DIAGNOSIS — N186 End stage renal disease: Secondary | ICD-10-CM | POA: Diagnosis not present

## 2018-07-31 DIAGNOSIS — D509 Iron deficiency anemia, unspecified: Secondary | ICD-10-CM | POA: Diagnosis not present

## 2018-07-31 DIAGNOSIS — N2581 Secondary hyperparathyroidism of renal origin: Secondary | ICD-10-CM | POA: Diagnosis not present

## 2018-08-03 DIAGNOSIS — D631 Anemia in chronic kidney disease: Secondary | ICD-10-CM | POA: Diagnosis not present

## 2018-08-03 DIAGNOSIS — D509 Iron deficiency anemia, unspecified: Secondary | ICD-10-CM | POA: Diagnosis not present

## 2018-08-03 DIAGNOSIS — N186 End stage renal disease: Secondary | ICD-10-CM | POA: Diagnosis not present

## 2018-08-03 DIAGNOSIS — N2581 Secondary hyperparathyroidism of renal origin: Secondary | ICD-10-CM | POA: Diagnosis not present

## 2018-08-05 DIAGNOSIS — N2581 Secondary hyperparathyroidism of renal origin: Secondary | ICD-10-CM | POA: Diagnosis not present

## 2018-08-05 DIAGNOSIS — D631 Anemia in chronic kidney disease: Secondary | ICD-10-CM | POA: Diagnosis not present

## 2018-08-05 DIAGNOSIS — N186 End stage renal disease: Secondary | ICD-10-CM | POA: Diagnosis not present

## 2018-08-05 DIAGNOSIS — D509 Iron deficiency anemia, unspecified: Secondary | ICD-10-CM | POA: Diagnosis not present

## 2018-08-07 DIAGNOSIS — D631 Anemia in chronic kidney disease: Secondary | ICD-10-CM | POA: Diagnosis not present

## 2018-08-07 DIAGNOSIS — N2581 Secondary hyperparathyroidism of renal origin: Secondary | ICD-10-CM | POA: Diagnosis not present

## 2018-08-07 DIAGNOSIS — N186 End stage renal disease: Secondary | ICD-10-CM | POA: Diagnosis not present

## 2018-08-07 DIAGNOSIS — D509 Iron deficiency anemia, unspecified: Secondary | ICD-10-CM | POA: Diagnosis not present

## 2018-08-10 DIAGNOSIS — N2581 Secondary hyperparathyroidism of renal origin: Secondary | ICD-10-CM | POA: Diagnosis not present

## 2018-08-10 DIAGNOSIS — D509 Iron deficiency anemia, unspecified: Secondary | ICD-10-CM | POA: Diagnosis not present

## 2018-08-10 DIAGNOSIS — D631 Anemia in chronic kidney disease: Secondary | ICD-10-CM | POA: Diagnosis not present

## 2018-08-10 DIAGNOSIS — N186 End stage renal disease: Secondary | ICD-10-CM | POA: Diagnosis not present

## 2018-08-11 ENCOUNTER — Ambulatory Visit (INDEPENDENT_AMBULATORY_CARE_PROVIDER_SITE_OTHER): Payer: Medicare Other | Admitting: Physician Assistant

## 2018-08-11 VITALS — BP 141/84 | HR 101 | Temp 98.0°F | Resp 18 | Ht 74.0 in | Wt 186.0 lb

## 2018-08-11 DIAGNOSIS — N186 End stage renal disease: Secondary | ICD-10-CM | POA: Diagnosis not present

## 2018-08-11 DIAGNOSIS — Z992 Dependence on renal dialysis: Secondary | ICD-10-CM

## 2018-08-11 NOTE — Progress Notes (Signed)
HPI:  Brian [name] is a 42 y.o. male who presents with chief complaint: poor flow rate in arterial cannula.  Patient is dialyzing from left radiocephalic arteriovenous fistula.  FINDING(S): after fistulagram 07/09/2018 by Dr. Bridgett Larsson 1. Sclerotic distal fistula in perianastomotic segment: treated with nitroglycerin without appreciable change: possible secondary to cannulation 2. Frozen valve in mid-segment: does not appear on fistulogram, but appreciated during balloon inflation 3. Resolution of pulsatile segment after angioplasty After angioplasty intervention  the flow was much improved with an excellent palpable thrill instead of pulsatile pattern to palpation.  If the patient's flow rates continue to be poor, I would surgically revised the anastomosis.  He is here today for a f/u visit.  He denise pain, numbness and loss of motor in the left UE.  He is on HD without problems since the procedure.  The flow rates have been acceptable.    He denise changes in his medical history since his last visit.  No fever or chills.  Allergies  Allergen Reactions  . Other     Upper respiratory issues, (sneezing)  . Cat Hair Extract Other (See Comments)    SNEEZING    Current Outpatient Medications  Medication Sig Dispense Refill  . albuterol (PROAIR HFA) 108 (90 Base) MCG/ACT inhaler Inhale 2 puffs into the lungs every 6 (six) hours as needed for wheezing or shortness of breath. 2 Inhaler 11  . amLODipine (NORVASC) 10 MG tablet Take 10 mg by mouth 2 (two) times a week.   5  . calcium acetate (PHOSLO) 667 MG capsule Take 1,334-2,668 mg by mouth See admin instructions. Take 2668 mg by mouth 3 times daily with meals and take 1334 mg with snacks    . camphor-menthol (SARNA) lotion Apply topically 2 (two) times daily. (Patient taking differently: Apply 1 application topically 2 (two) times daily as needed for itching. ) 222 mL 0  . DESCOVY 200-25 MG tablet TAKE 1 TABLET BY MOUTH DAILY 30 tablet  5  . dronabinol (MARINOL) 5 MG capsule Take 1 capsule (5 mg total) by mouth daily before lunch. Do not fill before 30 days from prior 30 capsule 3  . fluticasone (FLONASE) 50 MCG/ACT nasal spray USE 2 SPRAYS IN EACH NOSTRIL DAILY 16 g 11  . gabapentin (NEURONTIN) 100 MG capsule Take 100 mg by mouth 3 (three) times daily.    [name] GENERLAC 10 GM/15ML SOLN Take 20 g by mouth daily as needed (for constipation).   3  . oxyCODONE-acetaminophen (PERCOCET) 10-325 MG tablet Take 1 tablet by mouth every 6 (six) hours as needed for pain. DO NOT REFILL PRIOR TO 30 DAYS FROM PRIOR 81 tablet 0  . oxyCODONE-acetaminophen (PERCOCET) 10-325 MG tablet Take 1 tablet by mouth every 6 (six) hours as needed for pain. DO NOT REFILL PRIOR TO 30 DAYS FROM PRIOR 81 tablet 0  . rOPINIRole (REQUIP) 0.25 MG tablet Take 0.25 mg by mouth at bedtime.  3  . TIVICAY 50 MG tablet TAKE 1 TABLET BY MOUTH DAILY 30 tablet 5   No current facility-administered medications for this visit.      ROS:  See HPI  Physical Exam:  Vitals:   08/11/18 1516  BP: (!) 141/84  Pulse: (!) 101  Resp: 18  Temp: 98 F (36.7 C)  SpO2: 95%     Extremities:  Palpable strong thrill throughout the left forearm fistula.  Palpable radial pulse, sensation intact and equal B UE, grip 5/5.  Heart : RRR Abdomen:  + BS, soft Lungs CTA B non labored breathing  Assessment/Plan:  This is a 42 y.o. male who is s/p: S/p fistulagram with angioplasty left fore arm av fistula  He states he is not having any problems with his HD at this time.  He will f/u PRN in the future.     Roxy Horseman , PA-C [facility] [phone]

## 2018-08-13 DIAGNOSIS — N186 End stage renal disease: Secondary | ICD-10-CM | POA: Diagnosis not present

## 2018-08-13 DIAGNOSIS — N2581 Secondary hyperparathyroidism of renal origin: Secondary | ICD-10-CM | POA: Diagnosis not present

## 2018-08-13 DIAGNOSIS — D631 Anemia in chronic kidney disease: Secondary | ICD-10-CM | POA: Diagnosis not present

## 2018-08-13 DIAGNOSIS — D509 Iron deficiency anemia, unspecified: Secondary | ICD-10-CM | POA: Diagnosis not present

## 2018-08-16 DIAGNOSIS — I120 Hypertensive chronic kidney disease with stage 5 chronic kidney disease or end stage renal disease: Secondary | ICD-10-CM | POA: Diagnosis not present

## 2018-08-16 DIAGNOSIS — N186 End stage renal disease: Secondary | ICD-10-CM | POA: Diagnosis not present

## 2018-08-16 DIAGNOSIS — Z992 Dependence on renal dialysis: Secondary | ICD-10-CM | POA: Diagnosis not present

## 2018-08-17 DIAGNOSIS — Z992 Dependence on renal dialysis: Secondary | ICD-10-CM | POA: Diagnosis not present

## 2018-08-17 DIAGNOSIS — N186 End stage renal disease: Secondary | ICD-10-CM | POA: Diagnosis not present

## 2018-08-19 DIAGNOSIS — N2581 Secondary hyperparathyroidism of renal origin: Secondary | ICD-10-CM | POA: Diagnosis not present

## 2018-08-19 DIAGNOSIS — D631 Anemia in chronic kidney disease: Secondary | ICD-10-CM | POA: Diagnosis not present

## 2018-08-19 DIAGNOSIS — N186 End stage renal disease: Secondary | ICD-10-CM | POA: Diagnosis not present

## 2018-08-19 DIAGNOSIS — D509 Iron deficiency anemia, unspecified: Secondary | ICD-10-CM | POA: Diagnosis not present

## 2018-08-21 DIAGNOSIS — D631 Anemia in chronic kidney disease: Secondary | ICD-10-CM | POA: Diagnosis not present

## 2018-08-21 DIAGNOSIS — N186 End stage renal disease: Secondary | ICD-10-CM | POA: Diagnosis not present

## 2018-08-21 DIAGNOSIS — D509 Iron deficiency anemia, unspecified: Secondary | ICD-10-CM | POA: Diagnosis not present

## 2018-08-21 DIAGNOSIS — N2581 Secondary hyperparathyroidism of renal origin: Secondary | ICD-10-CM | POA: Diagnosis not present

## 2018-08-24 DIAGNOSIS — N186 End stage renal disease: Secondary | ICD-10-CM | POA: Diagnosis not present

## 2018-08-24 DIAGNOSIS — D509 Iron deficiency anemia, unspecified: Secondary | ICD-10-CM | POA: Diagnosis not present

## 2018-08-24 DIAGNOSIS — D631 Anemia in chronic kidney disease: Secondary | ICD-10-CM | POA: Diagnosis not present

## 2018-08-24 DIAGNOSIS — N2581 Secondary hyperparathyroidism of renal origin: Secondary | ICD-10-CM | POA: Diagnosis not present

## 2018-08-26 DIAGNOSIS — D509 Iron deficiency anemia, unspecified: Secondary | ICD-10-CM | POA: Diagnosis not present

## 2018-08-26 DIAGNOSIS — N186 End stage renal disease: Secondary | ICD-10-CM | POA: Diagnosis not present

## 2018-08-26 DIAGNOSIS — N2581 Secondary hyperparathyroidism of renal origin: Secondary | ICD-10-CM | POA: Diagnosis not present

## 2018-08-26 DIAGNOSIS — D631 Anemia in chronic kidney disease: Secondary | ICD-10-CM | POA: Diagnosis not present

## 2018-08-28 DIAGNOSIS — D631 Anemia in chronic kidney disease: Secondary | ICD-10-CM | POA: Diagnosis not present

## 2018-08-28 DIAGNOSIS — N2581 Secondary hyperparathyroidism of renal origin: Secondary | ICD-10-CM | POA: Diagnosis not present

## 2018-08-28 DIAGNOSIS — D509 Iron deficiency anemia, unspecified: Secondary | ICD-10-CM | POA: Diagnosis not present

## 2018-08-28 DIAGNOSIS — N186 End stage renal disease: Secondary | ICD-10-CM | POA: Diagnosis not present

## 2018-08-31 DIAGNOSIS — N2581 Secondary hyperparathyroidism of renal origin: Secondary | ICD-10-CM | POA: Diagnosis not present

## 2018-08-31 DIAGNOSIS — D509 Iron deficiency anemia, unspecified: Secondary | ICD-10-CM | POA: Diagnosis not present

## 2018-08-31 DIAGNOSIS — N186 End stage renal disease: Secondary | ICD-10-CM | POA: Diagnosis not present

## 2018-08-31 DIAGNOSIS — D631 Anemia in chronic kidney disease: Secondary | ICD-10-CM | POA: Diagnosis not present

## 2018-09-02 ENCOUNTER — Other Ambulatory Visit: Payer: Self-pay

## 2018-09-02 ENCOUNTER — Ambulatory Visit (INDEPENDENT_AMBULATORY_CARE_PROVIDER_SITE_OTHER): Payer: Medicare Other | Admitting: Internal Medicine

## 2018-09-02 ENCOUNTER — Ambulatory Visit (HOSPITAL_COMMUNITY)
Admission: RE | Admit: 2018-09-02 | Discharge: 2018-09-02 | Disposition: A | Discharge status: Home / Self Care | Payer: Medicare Other | Source: Ambulatory Visit | Attending: Oncology | Admitting: Oncology

## 2018-09-02 VITALS — BP 124/85 | HR 107 | Temp 97.8°F | Ht 74.0 in | Wt 190.7 lb

## 2018-09-02 DIAGNOSIS — R059 Cough, unspecified: Secondary | ICD-10-CM | POA: Insufficient documentation

## 2018-09-02 DIAGNOSIS — J440 Chronic obstructive pulmonary disease with acute lower respiratory infection: Secondary | ICD-10-CM | POA: Diagnosis not present

## 2018-09-02 DIAGNOSIS — R05 Cough: Secondary | ICD-10-CM | POA: Insufficient documentation

## 2018-09-02 DIAGNOSIS — J209 Acute bronchitis, unspecified: Secondary | ICD-10-CM | POA: Diagnosis not present

## 2018-09-02 DIAGNOSIS — I517 Cardiomegaly: Secondary | ICD-10-CM | POA: Diagnosis not present

## 2018-09-02 DIAGNOSIS — R918 Other nonspecific abnormal finding of lung field: Secondary | ICD-10-CM | POA: Diagnosis not present

## 2018-09-02 DIAGNOSIS — K469 Unspecified abdominal hernia without obstruction or gangrene: Secondary | ICD-10-CM | POA: Diagnosis not present

## 2018-09-02 DIAGNOSIS — B20 Human immunodeficiency virus [HIV] disease: Secondary | ICD-10-CM | POA: Diagnosis not present

## 2018-09-02 DIAGNOSIS — Z79899 Other long term (current) drug therapy: Secondary | ICD-10-CM | POA: Diagnosis not present

## 2018-09-02 DIAGNOSIS — N186 End stage renal disease: Secondary | ICD-10-CM

## 2018-09-02 DIAGNOSIS — H9313 Tinnitus, bilateral: Secondary | ICD-10-CM | POA: Diagnosis not present

## 2018-09-02 DIAGNOSIS — Z87891 Personal history of nicotine dependence: Secondary | ICD-10-CM

## 2018-09-02 DIAGNOSIS — Z992 Dependence on renal dialysis: Secondary | ICD-10-CM | POA: Diagnosis not present

## 2018-09-02 DIAGNOSIS — F17210 Nicotine dependence, cigarettes, uncomplicated: Secondary | ICD-10-CM

## 2018-09-02 DIAGNOSIS — R058 Other specified cough: Secondary | ICD-10-CM

## 2018-09-02 HISTORY — DX: Cough, unspecified: R05.9

## 2018-09-02 MED ORDER — AZITHROMYCIN 250 MG PO TABS: ORAL_TABLET | ORAL | 0 refills | Status: DC

## 2018-09-02 NOTE — Patient Instructions (Addendum)
It was our pleasure taking care of you in our clinic today.  You were seen due to cough and some shortness of breath.  On our exam your vital sign are stable with no fever.  Your oxygen level is normal.  Your lung exam is not consistent with pneumonia or worsening of her COPD.  Your last CD4 5 months ago was normal that shows your HIV was controlled. However to make sure we are not missing any pneumonia or underlying cause, we do a chest x-ray today and start you with antibiotic (azithromycin).  Please come back to clinic in a week if your symptoms worsen or fail to improve.  We will call you with chest x-ray results.  Please contact us  if you have any question or concern. Thanks,  Dr. Linna Hoff
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
## 2018-09-02 NOTE — Assessment & Plan Note (Signed)
Patient presented with 2-3 weeks Hx of earache, sore throat, productive cough, mostly when he lies on his right side. Also reports some blood on the white sputum. No fever. Reports some shortness of breath. On exam is not at respiratory distress. O 2 sat is normal. No fever. Ears exam including canal and TM are normal. No exudate on throat exam. Lung exam has some mild decreased breath sound but no rale, wheeze. And generally unremarkable on exam.  Has HIV and COPD and ESRD. Had reassuring CD 4, 5 months ago. How ever considering his comorbidities, smoking Hx, and immune deficiency we do XRAY today. And give him Azithromycin PO -Azithromycin 500 mg once today then 250 mg daily x4 days -CXR -Follow in clinic in 1 week or earlier as needed

## 2018-09-02 NOTE — Assessment & Plan Note (Signed)
Last CD 4 was 480 5 months ago. Doing well with medications. -Follow up in clinic as scheduled

## 2018-09-03 ENCOUNTER — Telehealth: Payer: Self-pay | Admitting: Internal Medicine

## 2018-09-03 NOTE — Progress Notes (Signed)
Medicine attending: I personally interviewed and examined this patient on the day of the patient visit and reviewed pertinent clinical ,laboratory, and radiographic data  with resident physician Dr. Linna Hoff and we discussed a management plan. Well controlled HIV disease. COPD. Now acute bronchitis. Decreased breath sounds right lung base. Resonant to percussion. No egophony. No rhonchi. CXR with minimal nonspecific changes RML & left base. No lobar infiltrates or effusions. In view of productive cough, we will Rx w 5d course of Azithromycin. Advised to call if sxs worsen.

## 2018-09-03 NOTE — Telephone Encounter (Signed)
Patient CXR reviewed with Dr. Darnell Level.  CXR is not significant for acute pneumonia. Symptoms are possibly due to acute bronchitis. How ever we already started with Azithromycin and continue that.  Has some small opacities on the right side, presented on previous CXR but not comparable as previous CXR was under inflation. I called the patient about the result of his CXR. His symptoms are the same. No worsening of SOB. No fever. Recommended to continue his Azithromycin and come back to clinic in 4 days or earlier if he gets worse and to be reevaluated. Informed him to go to ED if had sever shortness of breath.  He has Hx of HIV and is a heavy smoker and further follow up to r/o TB (despite the opacity is not on the upper lobe) or possible lung nodule is required, particularly if his symptoms wont resolved after course of Azithromycin.

## 2018-09-03 NOTE — Addendum Note (Signed)
Addended byDewayne Hatch on: 09/03/2018 11:02 AM   Modules accepted: Level of Service

## 2018-09-04 DIAGNOSIS — N186 End stage renal disease: Secondary | ICD-10-CM | POA: Diagnosis not present

## 2018-09-04 DIAGNOSIS — D631 Anemia in chronic kidney disease: Secondary | ICD-10-CM | POA: Diagnosis not present

## 2018-09-04 DIAGNOSIS — D509 Iron deficiency anemia, unspecified: Secondary | ICD-10-CM | POA: Diagnosis not present

## 2018-09-04 DIAGNOSIS — N2581 Secondary hyperparathyroidism of renal origin: Secondary | ICD-10-CM | POA: Diagnosis not present

## 2018-09-07 DIAGNOSIS — D509 Iron deficiency anemia, unspecified: Secondary | ICD-10-CM | POA: Diagnosis not present

## 2018-09-07 DIAGNOSIS — N186 End stage renal disease: Secondary | ICD-10-CM | POA: Diagnosis not present

## 2018-09-07 DIAGNOSIS — N2581 Secondary hyperparathyroidism of renal origin: Secondary | ICD-10-CM | POA: Diagnosis not present

## 2018-09-07 DIAGNOSIS — D631 Anemia in chronic kidney disease: Secondary | ICD-10-CM | POA: Diagnosis not present

## 2018-09-09 ENCOUNTER — Other Ambulatory Visit: Payer: Self-pay

## 2018-09-09 DIAGNOSIS — N186 End stage renal disease: Secondary | ICD-10-CM | POA: Diagnosis not present

## 2018-09-09 DIAGNOSIS — N2581 Secondary hyperparathyroidism of renal origin: Secondary | ICD-10-CM | POA: Diagnosis not present

## 2018-09-09 DIAGNOSIS — D509 Iron deficiency anemia, unspecified: Secondary | ICD-10-CM | POA: Diagnosis not present

## 2018-09-09 DIAGNOSIS — D631 Anemia in chronic kidney disease: Secondary | ICD-10-CM | POA: Diagnosis not present

## 2018-09-10 ENCOUNTER — Other Ambulatory Visit: Payer: Self-pay

## 2018-09-14 DIAGNOSIS — D509 Iron deficiency anemia, unspecified: Secondary | ICD-10-CM | POA: Diagnosis not present

## 2018-09-14 DIAGNOSIS — N2581 Secondary hyperparathyroidism of renal origin: Secondary | ICD-10-CM | POA: Diagnosis not present

## 2018-09-14 DIAGNOSIS — D631 Anemia in chronic kidney disease: Secondary | ICD-10-CM | POA: Diagnosis not present

## 2018-09-14 DIAGNOSIS — N186 End stage renal disease: Secondary | ICD-10-CM | POA: Diagnosis not present

## 2018-09-15 ENCOUNTER — Other Ambulatory Visit: Payer: Medicare Other

## 2018-09-15 DIAGNOSIS — B20 Human immunodeficiency virus [HIV] disease: Secondary | ICD-10-CM

## 2018-09-15 DIAGNOSIS — G4733 Obstructive sleep apnea (adult) (pediatric): Secondary | ICD-10-CM

## 2018-09-16 DIAGNOSIS — N186 End stage renal disease: Secondary | ICD-10-CM | POA: Diagnosis not present

## 2018-09-16 DIAGNOSIS — N2581 Secondary hyperparathyroidism of renal origin: Secondary | ICD-10-CM | POA: Diagnosis not present

## 2018-09-16 DIAGNOSIS — Z23 Encounter for immunization: Secondary | ICD-10-CM | POA: Diagnosis not present

## 2018-09-16 DIAGNOSIS — D631 Anemia in chronic kidney disease: Secondary | ICD-10-CM | POA: Diagnosis not present

## 2018-09-16 LAB — T-HELPER CELL (CD4) - (RCID CLINIC ONLY)
CD4 % Helper T Cell: 53 % (ref 33–55)
CD4 T Cell Abs: 630 /uL (ref 400–2700)

## 2018-09-17 LAB — HIV-1 RNA QUANT-NO REFLEX-BLD
HIV 1 RNA Quant: <20 copies/mL
HIV-1 RNA QUANT, LOG: NOT DETECTED {Log_copies}/mL

## 2018-09-18 DIAGNOSIS — D631 Anemia in chronic kidney disease: Secondary | ICD-10-CM | POA: Diagnosis not present

## 2018-09-18 DIAGNOSIS — N186 End stage renal disease: Secondary | ICD-10-CM | POA: Diagnosis not present

## 2018-09-18 DIAGNOSIS — N2581 Secondary hyperparathyroidism of renal origin: Secondary | ICD-10-CM | POA: Diagnosis not present

## 2018-09-18 DIAGNOSIS — Z23 Encounter for immunization: Secondary | ICD-10-CM | POA: Diagnosis not present

## 2018-09-21 DIAGNOSIS — N186 End stage renal disease: Secondary | ICD-10-CM | POA: Diagnosis not present

## 2018-09-21 DIAGNOSIS — D631 Anemia in chronic kidney disease: Secondary | ICD-10-CM | POA: Diagnosis not present

## 2018-09-21 DIAGNOSIS — Z23 Encounter for immunization: Secondary | ICD-10-CM | POA: Diagnosis not present

## 2018-09-21 DIAGNOSIS — N2581 Secondary hyperparathyroidism of renal origin: Secondary | ICD-10-CM | POA: Diagnosis not present

## 2018-09-23 ENCOUNTER — Encounter: Payer: Self-pay | Admitting: Internal Medicine

## 2018-09-25 DIAGNOSIS — N2581 Secondary hyperparathyroidism of renal origin: Secondary | ICD-10-CM | POA: Diagnosis not present

## 2018-09-25 DIAGNOSIS — D631 Anemia in chronic kidney disease: Secondary | ICD-10-CM | POA: Diagnosis not present

## 2018-09-25 DIAGNOSIS — Z23 Encounter for immunization: Secondary | ICD-10-CM | POA: Diagnosis not present

## 2018-09-25 DIAGNOSIS — N186 End stage renal disease: Secondary | ICD-10-CM | POA: Diagnosis not present

## 2018-09-29 DIAGNOSIS — N2581 Secondary hyperparathyroidism of renal origin: Secondary | ICD-10-CM | POA: Diagnosis not present

## 2018-09-29 DIAGNOSIS — D631 Anemia in chronic kidney disease: Secondary | ICD-10-CM | POA: Diagnosis not present

## 2018-09-29 DIAGNOSIS — N186 End stage renal disease: Secondary | ICD-10-CM | POA: Diagnosis not present

## 2018-09-29 DIAGNOSIS — Z23 Encounter for immunization: Secondary | ICD-10-CM | POA: Diagnosis not present

## 2018-09-30 DIAGNOSIS — N2581 Secondary hyperparathyroidism of renal origin: Secondary | ICD-10-CM | POA: Diagnosis not present

## 2018-09-30 DIAGNOSIS — D631 Anemia in chronic kidney disease: Secondary | ICD-10-CM | POA: Diagnosis not present

## 2018-09-30 DIAGNOSIS — Z23 Encounter for immunization: Secondary | ICD-10-CM | POA: Diagnosis not present

## 2018-09-30 DIAGNOSIS — N186 End stage renal disease: Secondary | ICD-10-CM | POA: Diagnosis not present

## 2018-10-01 ENCOUNTER — Encounter: Payer: Self-pay | Admitting: Internal Medicine

## 2018-10-01 ENCOUNTER — Ambulatory Visit (INDEPENDENT_AMBULATORY_CARE_PROVIDER_SITE_OTHER): Payer: Medicare Other | Admitting: Internal Medicine

## 2018-10-01 VITALS — BP 125/85 | HR 109 | Temp 98.0°F | Wt 218.0 lb

## 2018-10-01 DIAGNOSIS — B20 Human immunodeficiency virus [HIV] disease: Secondary | ICD-10-CM

## 2018-10-01 DIAGNOSIS — Z23 Encounter for immunization: Secondary | ICD-10-CM

## 2018-10-01 DIAGNOSIS — Z113 Encounter for screening for infections with a predominantly sexual mode of transmission: Secondary | ICD-10-CM

## 2018-10-01 NOTE — Assessment & Plan Note (Signed)
Doing well.  I encouraged better compliance.  rtc 6 months

## 2018-10-01 NOTE — Progress Notes (Signed)
   Subjective:    Patient ID: Brian [name], male    DOB: 1976/07/12, 42 y.o.   MRN: [id number]  HPI Here for follow up of HIV He continues on Tivicay and [place]cvoy.  He has had some missed doses one or two days occasionally or sometimes late.   No associated n/d.  No rashes.    CD4 630 and viral load < 20.    Review of Systems  Constitutional: Negative for fatigue and unexpected weight change.  Gastrointestinal: Negative for diarrhea.  Skin: Negative for rash.       Objective:   Physical Exam  Constitutional: He appears well-developed and well-nourished. No distress.  HENT:  Mouth/Throat: No oropharyngeal exudate.  Eyes: No scleral icterus.  Cardiovascular: Normal rate, regular rhythm and normal heart sounds.  No murmur heard. Pulmonary/Chest: Effort normal and breath sounds normal. No respiratory distress.  Skin: No rash noted.   SH: + tobacco       Assessment & Plan:

## 2018-10-01 NOTE — Assessment & Plan Note (Signed)
Will do Menveo today  #2 next visit

## 2018-10-01 NOTE — Addendum Note (Signed)
Addended by: Lenore Cordia on: 10/01/2018 09:56 AM   Modules accepted: Orders

## 2018-10-02 DIAGNOSIS — N2581 Secondary hyperparathyroidism of renal origin: Secondary | ICD-10-CM | POA: Diagnosis not present

## 2018-10-02 DIAGNOSIS — N186 End stage renal disease: Secondary | ICD-10-CM | POA: Diagnosis not present

## 2018-10-02 DIAGNOSIS — Z23 Encounter for immunization: Secondary | ICD-10-CM | POA: Diagnosis not present

## 2018-10-02 DIAGNOSIS — D631 Anemia in chronic kidney disease: Secondary | ICD-10-CM | POA: Diagnosis not present

## 2018-10-05 DIAGNOSIS — Z23 Encounter for immunization: Secondary | ICD-10-CM | POA: Diagnosis not present

## 2018-10-05 DIAGNOSIS — N2581 Secondary hyperparathyroidism of renal origin: Secondary | ICD-10-CM | POA: Diagnosis not present

## 2018-10-05 DIAGNOSIS — N186 End stage renal disease: Secondary | ICD-10-CM | POA: Diagnosis not present

## 2018-10-05 DIAGNOSIS — D631 Anemia in chronic kidney disease: Secondary | ICD-10-CM | POA: Diagnosis not present

## 2018-10-07 DIAGNOSIS — N2581 Secondary hyperparathyroidism of renal origin: Secondary | ICD-10-CM | POA: Diagnosis not present

## 2018-10-07 DIAGNOSIS — Z23 Encounter for immunization: Secondary | ICD-10-CM | POA: Diagnosis not present

## 2018-10-07 DIAGNOSIS — D631 Anemia in chronic kidney disease: Secondary | ICD-10-CM | POA: Diagnosis not present

## 2018-10-07 DIAGNOSIS — N186 End stage renal disease: Secondary | ICD-10-CM | POA: Diagnosis not present

## 2018-10-09 DIAGNOSIS — N2581 Secondary hyperparathyroidism of renal origin: Secondary | ICD-10-CM | POA: Diagnosis not present

## 2018-10-09 DIAGNOSIS — D631 Anemia in chronic kidney disease: Secondary | ICD-10-CM | POA: Diagnosis not present

## 2018-10-09 DIAGNOSIS — Z23 Encounter for immunization: Secondary | ICD-10-CM | POA: Diagnosis not present

## 2018-10-09 DIAGNOSIS — N186 End stage renal disease: Secondary | ICD-10-CM | POA: Diagnosis not present

## 2018-10-12 DIAGNOSIS — D631 Anemia in chronic kidney disease: Secondary | ICD-10-CM | POA: Diagnosis not present

## 2018-10-12 DIAGNOSIS — N186 End stage renal disease: Secondary | ICD-10-CM | POA: Diagnosis not present

## 2018-10-12 DIAGNOSIS — Z23 Encounter for immunization: Secondary | ICD-10-CM | POA: Diagnosis not present

## 2018-10-12 DIAGNOSIS — N2581 Secondary hyperparathyroidism of renal origin: Secondary | ICD-10-CM | POA: Diagnosis not present

## 2018-10-13 DIAGNOSIS — D631 Anemia in chronic kidney disease: Secondary | ICD-10-CM | POA: Diagnosis not present

## 2018-10-13 DIAGNOSIS — N2581 Secondary hyperparathyroidism of renal origin: Secondary | ICD-10-CM | POA: Diagnosis not present

## 2018-10-13 DIAGNOSIS — N186 End stage renal disease: Secondary | ICD-10-CM | POA: Diagnosis not present

## 2018-10-13 DIAGNOSIS — Z23 Encounter for immunization: Secondary | ICD-10-CM | POA: Diagnosis not present

## 2018-10-16 DIAGNOSIS — D631 Anemia in chronic kidney disease: Secondary | ICD-10-CM | POA: Diagnosis not present

## 2018-10-16 DIAGNOSIS — I120 Hypertensive chronic kidney disease with stage 5 chronic kidney disease or end stage renal disease: Secondary | ICD-10-CM | POA: Diagnosis not present

## 2018-10-16 DIAGNOSIS — N2581 Secondary hyperparathyroidism of renal origin: Secondary | ICD-10-CM | POA: Diagnosis not present

## 2018-10-16 DIAGNOSIS — Z992 Dependence on renal dialysis: Secondary | ICD-10-CM | POA: Diagnosis not present

## 2018-10-16 DIAGNOSIS — N186 End stage renal disease: Secondary | ICD-10-CM | POA: Diagnosis not present

## 2018-10-18 ENCOUNTER — Other Ambulatory Visit: Payer: Self-pay | Admitting: Internal Medicine

## 2018-10-18 DIAGNOSIS — B20 Human immunodeficiency virus [HIV] disease: Secondary | ICD-10-CM

## 2018-10-19 DIAGNOSIS — N186 End stage renal disease: Secondary | ICD-10-CM | POA: Diagnosis not present

## 2018-10-19 DIAGNOSIS — N2581 Secondary hyperparathyroidism of renal origin: Secondary | ICD-10-CM | POA: Diagnosis not present

## 2018-10-19 DIAGNOSIS — D631 Anemia in chronic kidney disease: Secondary | ICD-10-CM | POA: Diagnosis not present

## 2018-10-20 ENCOUNTER — Ambulatory Visit (INDEPENDENT_AMBULATORY_CARE_PROVIDER_SITE_OTHER): Payer: Medicare Other | Admitting: Internal Medicine

## 2018-10-20 ENCOUNTER — Encounter: Payer: Self-pay | Admitting: Internal Medicine

## 2018-10-20 ENCOUNTER — Other Ambulatory Visit: Payer: Self-pay

## 2018-10-20 DIAGNOSIS — Z992 Dependence on renal dialysis: Principal | ICD-10-CM

## 2018-10-20 DIAGNOSIS — G8929 Other chronic pain: Secondary | ICD-10-CM | POA: Diagnosis not present

## 2018-10-20 DIAGNOSIS — Z79891 Long term (current) use of opiate analgesic: Secondary | ICD-10-CM

## 2018-10-20 DIAGNOSIS — R1904 Left lower quadrant abdominal swelling, mass and lump: Secondary | ICD-10-CM

## 2018-10-20 DIAGNOSIS — F17210 Nicotine dependence, cigarettes, uncomplicated: Secondary | ICD-10-CM

## 2018-10-20 DIAGNOSIS — N186 End stage renal disease: Secondary | ICD-10-CM

## 2018-10-20 MED ORDER — OXYCODONE-ACETAMINOPHEN 10-325 MG PO TABS: 1.0000 | ORAL_TABLET | Freq: Three times a day (TID) | ORAL | 0 refills | Status: DC | PRN

## 2018-10-20 NOTE — Assessment & Plan Note (Signed)
Brian [name] is on chronic opioid therapy for chronic pain. The date of the controlled substances contract is referenced in the [place] and / or the overview. Date of pain contract was 07/2017. As part of the treatment plan, the [place] controlled substance database is checked at least twice yearly and the database results are not appropriate. I have last reviewed the results on today 10/20/2018.    The last UDS was on 01/2018 and results are as expected. Patient needs at least a yearly UDS.   The patient is on Percocet strength 10-325, 81 per 30 days, currently we are tapering this. Adjunctive treatment includes gabapentin. This regimen allows Brian [name] to function and does not cause excessive sedation or other side effects.  The benefits of continuing opioid therapy do not outweigh the risks because the patient is obtaining other sedating and controlled medications from an outside provider which put him at risk for complications including overdose.   Interventions today include: - Continue with plan for taper by 10% to a dose of 24 mg daily down from initial 30 mg daily Refills - 3 paper Rx printed Percocet 10-325 mg #75
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
## 2018-10-20 NOTE — Patient Instructions (Addendum)
Thank you for coming to the clinic today. It was a pleasure to see you.   Your prescriptions have been sent to your pharmacy  FOLLOW-UP INSTRUCTIONS When: 3 months with Dr. Hetty Ely  For: follow up of your pain  What to bring: all of your medication bottles   Please call the internal medicine center clinic if you have any questions or concerns, we may be able to help and keep you from a long and expensive emergency room wait. Our clinic and after hours phone number is [phone], the best time to call is Monday through Friday 9 am to 4 pm but there is always someone available 24/7 if you have an emergency. If you need medication refills please notify your pharmacy one week in advance and they will send [place] a request.

## 2018-10-21 DIAGNOSIS — N186 End stage renal disease: Secondary | ICD-10-CM | POA: Diagnosis not present

## 2018-10-21 DIAGNOSIS — N2581 Secondary hyperparathyroidism of renal origin: Secondary | ICD-10-CM | POA: Diagnosis not present

## 2018-10-21 DIAGNOSIS — D631 Anemia in chronic kidney disease: Secondary | ICD-10-CM | POA: Diagnosis not present

## 2018-10-23 NOTE — Progress Notes (Signed)
Case discussed with Dr. Hetty Ely soon after the resident saw the patient.  We reviewed the resident's history and exam and pertinent patient test results.  I agree with the assessment, diagnosis and plan of care documented in the resident's note.

## 2018-10-24 DIAGNOSIS — D631 Anemia in chronic kidney disease: Secondary | ICD-10-CM | POA: Diagnosis not present

## 2018-10-24 DIAGNOSIS — N2581 Secondary hyperparathyroidism of renal origin: Secondary | ICD-10-CM | POA: Diagnosis not present

## 2018-10-24 DIAGNOSIS — N186 End stage renal disease: Secondary | ICD-10-CM | POA: Diagnosis not present

## 2018-10-26 DIAGNOSIS — N186 End stage renal disease: Secondary | ICD-10-CM | POA: Diagnosis not present

## 2018-10-26 DIAGNOSIS — D631 Anemia in chronic kidney disease: Secondary | ICD-10-CM | POA: Diagnosis not present

## 2018-10-26 DIAGNOSIS — N2581 Secondary hyperparathyroidism of renal origin: Secondary | ICD-10-CM | POA: Diagnosis not present

## 2018-10-28 ENCOUNTER — Ambulatory Visit (HOSPITAL_COMMUNITY): Payer: Medicare Other

## 2018-10-28 ENCOUNTER — Ambulatory Visit: Payer: Medicare Other | Admitting: Vascular Surgery

## 2018-10-30 DIAGNOSIS — N186 End stage renal disease: Secondary | ICD-10-CM | POA: Diagnosis not present

## 2018-10-30 DIAGNOSIS — D631 Anemia in chronic kidney disease: Secondary | ICD-10-CM | POA: Diagnosis not present

## 2018-10-30 DIAGNOSIS — N2581 Secondary hyperparathyroidism of renal origin: Secondary | ICD-10-CM | POA: Diagnosis not present

## 2018-11-02 DIAGNOSIS — D631 Anemia in chronic kidney disease: Secondary | ICD-10-CM | POA: Diagnosis not present

## 2018-11-02 DIAGNOSIS — N186 End stage renal disease: Secondary | ICD-10-CM | POA: Diagnosis not present

## 2018-11-02 DIAGNOSIS — N2581 Secondary hyperparathyroidism of renal origin: Secondary | ICD-10-CM | POA: Diagnosis not present

## 2018-11-04 DIAGNOSIS — N186 End stage renal disease: Secondary | ICD-10-CM | POA: Diagnosis not present

## 2018-11-04 DIAGNOSIS — N2581 Secondary hyperparathyroidism of renal origin: Secondary | ICD-10-CM | POA: Diagnosis not present

## 2018-11-04 DIAGNOSIS — D631 Anemia in chronic kidney disease: Secondary | ICD-10-CM | POA: Diagnosis not present

## 2018-11-06 DIAGNOSIS — N2581 Secondary hyperparathyroidism of renal origin: Secondary | ICD-10-CM | POA: Diagnosis not present

## 2018-11-06 DIAGNOSIS — N186 End stage renal disease: Secondary | ICD-10-CM | POA: Diagnosis not present

## 2018-11-06 DIAGNOSIS — D631 Anemia in chronic kidney disease: Secondary | ICD-10-CM | POA: Diagnosis not present

## 2018-11-10 DIAGNOSIS — D631 Anemia in chronic kidney disease: Secondary | ICD-10-CM | POA: Diagnosis not present

## 2018-11-10 DIAGNOSIS — N186 End stage renal disease: Secondary | ICD-10-CM | POA: Diagnosis not present

## 2018-11-10 DIAGNOSIS — N2581 Secondary hyperparathyroidism of renal origin: Secondary | ICD-10-CM | POA: Diagnosis not present

## 2018-11-13 DIAGNOSIS — N186 End stage renal disease: Secondary | ICD-10-CM | POA: Diagnosis not present

## 2018-11-13 DIAGNOSIS — D631 Anemia in chronic kidney disease: Secondary | ICD-10-CM | POA: Diagnosis not present

## 2018-11-13 DIAGNOSIS — N2581 Secondary hyperparathyroidism of renal origin: Secondary | ICD-10-CM | POA: Diagnosis not present

## 2018-11-15 DIAGNOSIS — N186 End stage renal disease: Secondary | ICD-10-CM | POA: Diagnosis not present

## 2018-11-15 DIAGNOSIS — I120 Hypertensive chronic kidney disease with stage 5 chronic kidney disease or end stage renal disease: Secondary | ICD-10-CM | POA: Diagnosis not present

## 2018-11-15 DIAGNOSIS — Z992 Dependence on renal dialysis: Secondary | ICD-10-CM | POA: Diagnosis not present

## 2018-11-16 DIAGNOSIS — N186 End stage renal disease: Secondary | ICD-10-CM | POA: Diagnosis not present

## 2018-11-16 DIAGNOSIS — D631 Anemia in chronic kidney disease: Secondary | ICD-10-CM | POA: Diagnosis not present

## 2018-11-16 DIAGNOSIS — N2581 Secondary hyperparathyroidism of renal origin: Secondary | ICD-10-CM | POA: Diagnosis not present

## 2018-11-20 DIAGNOSIS — D631 Anemia in chronic kidney disease: Secondary | ICD-10-CM | POA: Diagnosis not present

## 2018-11-20 DIAGNOSIS — N186 End stage renal disease: Secondary | ICD-10-CM | POA: Diagnosis not present

## 2018-11-20 DIAGNOSIS — N2581 Secondary hyperparathyroidism of renal origin: Secondary | ICD-10-CM | POA: Diagnosis not present

## 2018-11-23 DIAGNOSIS — D631 Anemia in chronic kidney disease: Secondary | ICD-10-CM | POA: Diagnosis not present

## 2018-11-23 DIAGNOSIS — N2581 Secondary hyperparathyroidism of renal origin: Secondary | ICD-10-CM | POA: Diagnosis not present

## 2018-11-23 DIAGNOSIS — N186 End stage renal disease: Secondary | ICD-10-CM | POA: Diagnosis not present

## 2018-11-25 DIAGNOSIS — N186 End stage renal disease: Secondary | ICD-10-CM | POA: Diagnosis not present

## 2018-11-25 DIAGNOSIS — N2581 Secondary hyperparathyroidism of renal origin: Secondary | ICD-10-CM | POA: Diagnosis not present

## 2018-11-25 DIAGNOSIS — D631 Anemia in chronic kidney disease: Secondary | ICD-10-CM | POA: Diagnosis not present

## 2018-11-27 DIAGNOSIS — N2581 Secondary hyperparathyroidism of renal origin: Secondary | ICD-10-CM | POA: Diagnosis not present

## 2018-11-27 DIAGNOSIS — N186 End stage renal disease: Secondary | ICD-10-CM | POA: Diagnosis not present

## 2018-11-27 DIAGNOSIS — D631 Anemia in chronic kidney disease: Secondary | ICD-10-CM | POA: Diagnosis not present

## 2018-11-30 DIAGNOSIS — N2581 Secondary hyperparathyroidism of renal origin: Secondary | ICD-10-CM | POA: Diagnosis not present

## 2018-11-30 DIAGNOSIS — D631 Anemia in chronic kidney disease: Secondary | ICD-10-CM | POA: Diagnosis not present

## 2018-11-30 DIAGNOSIS — N186 End stage renal disease: Secondary | ICD-10-CM | POA: Diagnosis not present

## 2018-12-02 DIAGNOSIS — N2581 Secondary hyperparathyroidism of renal origin: Secondary | ICD-10-CM | POA: Diagnosis not present

## 2018-12-02 DIAGNOSIS — D631 Anemia in chronic kidney disease: Secondary | ICD-10-CM | POA: Diagnosis not present

## 2018-12-02 DIAGNOSIS — N186 End stage renal disease: Secondary | ICD-10-CM | POA: Diagnosis not present

## 2018-12-04 DIAGNOSIS — N2581 Secondary hyperparathyroidism of renal origin: Secondary | ICD-10-CM | POA: Diagnosis not present

## 2018-12-04 DIAGNOSIS — N186 End stage renal disease: Secondary | ICD-10-CM | POA: Diagnosis not present

## 2018-12-04 DIAGNOSIS — D631 Anemia in chronic kidney disease: Secondary | ICD-10-CM | POA: Diagnosis not present

## 2018-12-06 DIAGNOSIS — D631 Anemia in chronic kidney disease: Secondary | ICD-10-CM | POA: Diagnosis not present

## 2018-12-06 DIAGNOSIS — N186 End stage renal disease: Secondary | ICD-10-CM | POA: Diagnosis not present

## 2018-12-06 DIAGNOSIS — N2581 Secondary hyperparathyroidism of renal origin: Secondary | ICD-10-CM | POA: Diagnosis not present

## 2018-12-08 DIAGNOSIS — N2581 Secondary hyperparathyroidism of renal origin: Secondary | ICD-10-CM | POA: Diagnosis not present

## 2018-12-08 DIAGNOSIS — N186 End stage renal disease: Secondary | ICD-10-CM | POA: Diagnosis not present

## 2018-12-08 DIAGNOSIS — D631 Anemia in chronic kidney disease: Secondary | ICD-10-CM | POA: Diagnosis not present

## 2018-12-11 DIAGNOSIS — D631 Anemia in chronic kidney disease: Secondary | ICD-10-CM | POA: Diagnosis not present

## 2018-12-11 DIAGNOSIS — N2581 Secondary hyperparathyroidism of renal origin: Secondary | ICD-10-CM | POA: Diagnosis not present

## 2018-12-11 DIAGNOSIS — N186 End stage renal disease: Secondary | ICD-10-CM | POA: Diagnosis not present

## 2018-12-13 DIAGNOSIS — D631 Anemia in chronic kidney disease: Secondary | ICD-10-CM | POA: Diagnosis not present

## 2018-12-13 DIAGNOSIS — N186 End stage renal disease: Secondary | ICD-10-CM | POA: Diagnosis not present

## 2018-12-13 DIAGNOSIS — N2581 Secondary hyperparathyroidism of renal origin: Secondary | ICD-10-CM | POA: Diagnosis not present

## 2018-12-15 DIAGNOSIS — N186 End stage renal disease: Secondary | ICD-10-CM | POA: Diagnosis not present

## 2018-12-15 DIAGNOSIS — D631 Anemia in chronic kidney disease: Secondary | ICD-10-CM | POA: Diagnosis not present

## 2018-12-15 DIAGNOSIS — N2581 Secondary hyperparathyroidism of renal origin: Secondary | ICD-10-CM | POA: Diagnosis not present

## 2018-12-16 ENCOUNTER — Emergency Department (HOSPITAL_COMMUNITY): Payer: Medicare Other

## 2018-12-16 ENCOUNTER — Other Ambulatory Visit: Payer: Self-pay

## 2018-12-16 ENCOUNTER — Other Ambulatory Visit: Payer: Self-pay | Admitting: Internal Medicine

## 2018-12-16 ENCOUNTER — Inpatient Hospital Stay (HOSPITAL_COMMUNITY)
Admission: EM | Admit: 2018-12-16 | Discharge: 2018-12-18 | DRG: 814 | Disposition: A | Discharge status: Home / Self Care | Payer: Medicare Other | Attending: Oncology | Admitting: Oncology

## 2018-12-16 DIAGNOSIS — D649 Anemia, unspecified: Secondary | ICD-10-CM

## 2018-12-16 DIAGNOSIS — J449 Chronic obstructive pulmonary disease, unspecified: Secondary | ICD-10-CM | POA: Diagnosis present

## 2018-12-16 DIAGNOSIS — Z21 Asymptomatic human immunodeficiency virus [HIV] infection status: Secondary | ICD-10-CM | POA: Diagnosis present

## 2018-12-16 DIAGNOSIS — Z8249 Family history of ischemic heart disease and other diseases of the circulatory system: Secondary | ICD-10-CM

## 2018-12-16 DIAGNOSIS — Z7951 Long term (current) use of inhaled steroids: Secondary | ICD-10-CM

## 2018-12-16 DIAGNOSIS — I5020 Unspecified systolic (congestive) heart failure: Secondary | ICD-10-CM | POA: Diagnosis present

## 2018-12-16 DIAGNOSIS — D539 Nutritional anemia, unspecified: Secondary | ICD-10-CM | POA: Diagnosis present

## 2018-12-16 DIAGNOSIS — R1084 Generalized abdominal pain: Secondary | ICD-10-CM | POA: Diagnosis not present

## 2018-12-16 DIAGNOSIS — D6959 Other secondary thrombocytopenia: Secondary | ICD-10-CM | POA: Diagnosis present

## 2018-12-16 DIAGNOSIS — D696 Thrombocytopenia, unspecified: Secondary | ICD-10-CM | POA: Diagnosis not present

## 2018-12-16 DIAGNOSIS — Z992 Dependence on renal dialysis: Secondary | ICD-10-CM

## 2018-12-16 DIAGNOSIS — K661 Hemoperitoneum: Secondary | ICD-10-CM | POA: Diagnosis not present

## 2018-12-16 DIAGNOSIS — Q446 Cystic disease of liver: Secondary | ICD-10-CM

## 2018-12-16 DIAGNOSIS — I953 Hypotension of hemodialysis: Secondary | ICD-10-CM | POA: Diagnosis not present

## 2018-12-16 DIAGNOSIS — K429 Umbilical hernia without obstruction or gangrene: Secondary | ICD-10-CM | POA: Diagnosis present

## 2018-12-16 DIAGNOSIS — G2581 Restless legs syndrome: Secondary | ICD-10-CM | POA: Diagnosis present

## 2018-12-16 DIAGNOSIS — Z79891 Long term (current) use of opiate analgesic: Secondary | ICD-10-CM

## 2018-12-16 DIAGNOSIS — Z9181 History of falling: Secondary | ICD-10-CM

## 2018-12-16 DIAGNOSIS — W1830XA Fall on same level, unspecified, initial encounter: Secondary | ICD-10-CM | POA: Diagnosis present

## 2018-12-16 DIAGNOSIS — K7689 Other specified diseases of liver: Secondary | ICD-10-CM | POA: Diagnosis not present

## 2018-12-16 DIAGNOSIS — I132 Hypertensive heart and chronic kidney disease with heart failure and with stage 5 chronic kidney disease, or end stage renal disease: Secondary | ICD-10-CM | POA: Diagnosis present

## 2018-12-16 DIAGNOSIS — Z86718 Personal history of other venous thrombosis and embolism: Secondary | ICD-10-CM

## 2018-12-16 DIAGNOSIS — G609 Hereditary and idiopathic neuropathy, unspecified: Secondary | ICD-10-CM | POA: Diagnosis present

## 2018-12-16 DIAGNOSIS — I428 Other cardiomyopathies: Secondary | ICD-10-CM | POA: Diagnosis not present

## 2018-12-16 DIAGNOSIS — S0003XA Contusion of scalp, initial encounter: Secondary | ICD-10-CM | POA: Diagnosis present

## 2018-12-16 DIAGNOSIS — S36039A Unspecified laceration of spleen, initial encounter: Secondary | ICD-10-CM

## 2018-12-16 DIAGNOSIS — Z79899 Other long term (current) drug therapy: Secondary | ICD-10-CM | POA: Diagnosis not present

## 2018-12-16 DIAGNOSIS — F17210 Nicotine dependence, cigarettes, uncomplicated: Secondary | ICD-10-CM | POA: Diagnosis present

## 2018-12-16 DIAGNOSIS — Z87442 Personal history of urinary calculi: Secondary | ICD-10-CM

## 2018-12-16 DIAGNOSIS — I712 Thoracic aortic aneurysm, without rupture: Secondary | ICD-10-CM | POA: Diagnosis present

## 2018-12-16 DIAGNOSIS — B20 Human immunodeficiency virus [HIV] disease: Secondary | ICD-10-CM

## 2018-12-16 DIAGNOSIS — D735 Infarction of spleen: Secondary | ICD-10-CM | POA: Diagnosis present

## 2018-12-16 DIAGNOSIS — D631 Anemia in chronic kidney disease: Secondary | ICD-10-CM | POA: Diagnosis present

## 2018-12-16 DIAGNOSIS — Z905 Acquired absence of kidney: Secondary | ICD-10-CM | POA: Diagnosis not present

## 2018-12-16 DIAGNOSIS — Z8271 Family history of polycystic kidney: Secondary | ICD-10-CM

## 2018-12-16 DIAGNOSIS — Z820 Family history of epilepsy and other diseases of the nervous system: Secondary | ICD-10-CM

## 2018-12-16 DIAGNOSIS — Q613 Polycystic kidney, unspecified: Secondary | ICD-10-CM | POA: Diagnosis not present

## 2018-12-16 DIAGNOSIS — Z8349 Family history of other endocrine, nutritional and metabolic diseases: Secondary | ICD-10-CM

## 2018-12-16 DIAGNOSIS — Z86711 Personal history of pulmonary embolism: Secondary | ICD-10-CM

## 2018-12-16 DIAGNOSIS — N186 End stage renal disease: Secondary | ICD-10-CM | POA: Diagnosis present

## 2018-12-16 DIAGNOSIS — Z9862 Peripheral vascular angioplasty status: Secondary | ICD-10-CM

## 2018-12-16 DIAGNOSIS — N2581 Secondary hyperparathyroidism of renal origin: Secondary | ICD-10-CM | POA: Diagnosis present

## 2018-12-16 DIAGNOSIS — R112 Nausea with vomiting, unspecified: Secondary | ICD-10-CM | POA: Diagnosis not present

## 2018-12-16 DIAGNOSIS — D509 Iron deficiency anemia, unspecified: Secondary | ICD-10-CM | POA: Diagnosis not present

## 2018-12-16 DIAGNOSIS — I120 Hypertensive chronic kidney disease with stage 5 chronic kidney disease or end stage renal disease: Secondary | ICD-10-CM | POA: Diagnosis not present

## 2018-12-16 DIAGNOSIS — S36032A Major laceration of spleen, initial encounter: Secondary | ICD-10-CM | POA: Diagnosis not present

## 2018-12-16 DIAGNOSIS — Z833 Family history of diabetes mellitus: Secondary | ICD-10-CM

## 2018-12-16 DIAGNOSIS — D72819 Decreased white blood cell count, unspecified: Secondary | ICD-10-CM | POA: Diagnosis present

## 2018-12-16 DIAGNOSIS — Z91048 Other nonmedicinal substance allergy status: Secondary | ICD-10-CM

## 2018-12-16 DIAGNOSIS — Y92000 Kitchen of unspecified non-institutional (private) residence as  the place of occurrence of the external cause: Secondary | ICD-10-CM

## 2018-12-16 DIAGNOSIS — Z9115 Patient's noncompliance with renal dialysis: Secondary | ICD-10-CM | POA: Diagnosis not present

## 2018-12-16 DIAGNOSIS — R55 Syncope and collapse: Secondary | ICD-10-CM

## 2018-12-16 DIAGNOSIS — S3681XA Injury of peritoneum, initial encounter: Secondary | ICD-10-CM | POA: Diagnosis not present

## 2018-12-16 DIAGNOSIS — S3600XA Unspecified injury of spleen, initial encounter: Secondary | ICD-10-CM | POA: Diagnosis not present

## 2018-12-16 DIAGNOSIS — S36030A Superficial (capsular) laceration of spleen, initial encounter: Secondary | ICD-10-CM | POA: Diagnosis not present

## 2018-12-16 DIAGNOSIS — W19XXXA Unspecified fall, initial encounter: Secondary | ICD-10-CM | POA: Diagnosis not present

## 2018-12-16 LAB — CBC WITH DIFFERENTIAL/PLATELET
Abs Immature Granulocytes: 0 10*3/uL (ref 0.00–0.07)
BASOS PCT: 0 %
Basophils Absolute: 0 10*3/uL (ref 0.0–0.1)
EOS ABS: 0 10*3/uL (ref 0.0–0.5)
Eosinophils Relative: 1 %
HCT: 34.1 % — ABNORMAL LOW (ref 39.0–52.0)
HEMOGLOBIN: 11.2 g/dL — AB (ref 13.0–17.0)
IMMATURE GRANULOCYTES: 0 %
Lymphocytes Relative: 27 %
Lymphs Abs: 1 10*3/uL (ref 0.7–4.0)
MCH: 36.4 pg — AB (ref 26.0–34.0)
MCHC: 32.8 g/dL (ref 30.0–36.0)
MCV: 110.7 fL — ABNORMAL HIGH (ref 80.0–100.0)
MONOS PCT: 13 %
Monocytes Absolute: 0.5 10*3/uL (ref 0.1–1.0)
Neutro Abs: 2.1 10*3/uL (ref 1.7–7.7)
Neutrophils Relative %: 59 %
Platelets: 88 10*3/uL — ABNORMAL LOW (ref 150–400)
RBC: 3.08 MIL/uL — ABNORMAL LOW (ref 4.22–5.81)
RDW: 15.5 % (ref 11.5–15.5)
WBC: 3.7 10*3/uL — ABNORMAL LOW (ref 4.0–10.5)
nRBC: 0 % (ref 0.0–0.2)

## 2018-12-16 LAB — COMPREHENSIVE METABOLIC PANEL
ALBUMIN: 3.7 g/dL (ref 3.5–5.0)
ALK PHOS: 66 U/L (ref 38–126)
ALT: 10 U/L (ref 0–44)
ANION GAP: 16 — AB (ref 5–15)
AST: 16 U/L (ref 15–41)
BUN: 41 mg/dL — ABNORMAL HIGH (ref 6–20)
CALCIUM: 8.9 mg/dL (ref 8.9–10.3)
CO2: 23 mmol/L (ref 22–32)
CREATININE: 8.78 mg/dL — AB (ref 0.61–1.24)
Chloride: 92 mmol/L — ABNORMAL LOW (ref 98–111)
GFR calc Af Amer: 8 mL/min — ABNORMAL LOW (ref >60)
GFR calc non Af Amer: 7 mL/min — ABNORMAL LOW (ref >60)
GLUCOSE: 99 mg/dL (ref 70–99)
Potassium: 4.6 mmol/L (ref 3.5–5.1)
SODIUM: 131 mmol/L — AB (ref 135–145)
Total Bilirubin: 0.8 mg/dL (ref 0.3–1.2)
Total Protein: 7 g/dL (ref 6.5–8.1)

## 2018-12-16 LAB — SAVE SMEAR(SSMR), FOR PROVIDER SLIDE REVIEW

## 2018-12-16 LAB — LIPASE, BLOOD: Lipase: 25 U/L (ref 11–51)

## 2018-12-16 MED ORDER — FENTANYL CITRATE (PF) 100 MCG/2ML IJ SOLN
50.0000 ug | Freq: Once | INTRAMUSCULAR | Status: AC
  Administered 2018-12-16: 50 ug via INTRAVENOUS
  Filled 2018-12-16: qty 2

## 2018-12-16 MED ORDER — ROPINIROLE HCL 0.25 MG PO TABS
0.2500 mg | ORAL_TABLET | Freq: Every evening | ORAL | Status: DC | PRN
  Administered 2018-12-17: 0.25 mg via ORAL
  Filled 2018-12-16 (×3): qty 1

## 2018-12-16 MED ORDER — DIAZEPAM 5 MG PO TABS
5.0000 mg | ORAL_TABLET | Freq: Every evening | ORAL | Status: DC | PRN
  Administered 2018-12-17: 5 mg via ORAL
  Filled 2018-12-16: qty 1

## 2018-12-16 MED ORDER — ONDANSETRON HCL 4 MG/2ML IJ SOLN
4.0000 mg | Freq: Once | INTRAMUSCULAR | Status: AC
  Administered 2018-12-16: 4 mg via INTRAVENOUS
  Filled 2018-12-16: qty 2

## 2018-12-16 MED ORDER — OXYCODONE-ACETAMINOPHEN 10-325 MG PO TABS: 1.0000 | ORAL_TABLET | Freq: Three times a day (TID) | ORAL | Status: DC | PRN

## 2018-12-16 MED ORDER — ALBUTEROL SULFATE (2.5 MG/3ML) 0.083% IN NEBU: 2.5000 mg | INHALATION_SOLUTION | Freq: Four times a day (QID) | RESPIRATORY_TRACT | Status: DC | PRN

## 2018-12-16 MED ORDER — DOLUTEGRAVIR SODIUM 50 MG PO TABS
50.0000 mg | ORAL_TABLET | Freq: Every day | ORAL | Status: DC
  Administered 2018-12-17 – 2018-12-18 (×2): 50 mg via ORAL
  Filled 2018-12-16 (×2): qty 1

## 2018-12-16 MED ORDER — NICOTINE 14 MG/24HR TD PT24
14.0000 mg | MEDICATED_PATCH | Freq: Every day | TRANSDERMAL | Status: DC
  Administered 2018-12-16 – 2018-12-17 (×2): 14 mg via TRANSDERMAL
  Filled 2018-12-16 (×2): qty 1

## 2018-12-16 MED ORDER — GABAPENTIN 100 MG PO CAPS
100.0000 mg | ORAL_CAPSULE | Freq: Three times a day (TID) | ORAL | Status: DC
  Administered 2018-12-16: 100 mg via ORAL
  Filled 2018-12-16: qty 1

## 2018-12-16 MED ORDER — EMTRICITABINE-TENOFOVIR AF 200-25 MG PO TABS
1.0000 | ORAL_TABLET | Freq: Every day | ORAL | Status: DC
  Administered 2018-12-17 – 2018-12-18 (×2): 1 via ORAL
  Filled 2018-12-16 (×2): qty 1

## 2018-12-16 MED ORDER — FERRIC CITRATE 1 GM 210 MG(FE) PO TABS
210.0000 mg | ORAL_TABLET | Freq: Two times a day (BID) | ORAL | Status: DC | PRN
  Filled 2018-12-16: qty 1

## 2018-12-16 MED ORDER — ALBUTEROL SULFATE HFA 108 (90 BASE) MCG/ACT IN AERS: 2.0000 | INHALATION_SPRAY | Freq: Four times a day (QID) | RESPIRATORY_TRACT | Status: DC | PRN

## 2018-12-16 MED ORDER — OXYCODONE HCL 5 MG PO TABS
5.0000 mg | ORAL_TABLET | Freq: Three times a day (TID) | ORAL | Status: DC | PRN
  Administered 2018-12-16 – 2018-12-18 (×4): 5 mg via ORAL
  Filled 2018-12-16 (×4): qty 1

## 2018-12-16 MED ORDER — FERRIC CITRATE 1 GM 210 MG(FE) PO TABS
420.0000 mg | ORAL_TABLET | Freq: Three times a day (TID) | ORAL | Status: DC
  Administered 2018-12-17 – 2018-12-18 (×3): 420 mg via ORAL
  Filled 2018-12-16 (×5): qty 2

## 2018-12-16 MED ORDER — OXYCODONE-ACETAMINOPHEN 5-325 MG PO TABS
1.0000 | ORAL_TABLET | Freq: Three times a day (TID) | ORAL | Status: DC | PRN
  Administered 2018-12-16 – 2018-12-18 (×4): 1 via ORAL
  Filled 2018-12-16 (×4): qty 1

## 2018-12-16 NOTE — ED Triage Notes (Signed)
Pt here with c/o abd pain n/v times 2 days , pt is on dialysis , has not missed any ,
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
## 2018-12-17 ENCOUNTER — Encounter (HOSPITAL_COMMUNITY): Payer: Self-pay

## 2018-12-17 DIAGNOSIS — S36039A Unspecified laceration of spleen, initial encounter: Secondary | ICD-10-CM

## 2018-12-17 DIAGNOSIS — D696 Thrombocytopenia, unspecified: Secondary | ICD-10-CM

## 2018-12-17 DIAGNOSIS — N186 End stage renal disease: Secondary | ICD-10-CM

## 2018-12-17 DIAGNOSIS — Z79899 Other long term (current) drug therapy: Secondary | ICD-10-CM

## 2018-12-17 DIAGNOSIS — Z21 Asymptomatic human immunodeficiency virus [HIV] infection status: Secondary | ICD-10-CM

## 2018-12-17 DIAGNOSIS — W19XXXA Unspecified fall, initial encounter: Secondary | ICD-10-CM

## 2018-12-17 DIAGNOSIS — Q446 Cystic disease of liver: Secondary | ICD-10-CM

## 2018-12-17 DIAGNOSIS — Z905 Acquired absence of kidney: Secondary | ICD-10-CM

## 2018-12-17 DIAGNOSIS — Q613 Polycystic kidney, unspecified: Secondary | ICD-10-CM

## 2018-12-17 DIAGNOSIS — I428 Other cardiomyopathies: Secondary | ICD-10-CM

## 2018-12-17 DIAGNOSIS — R55 Syncope and collapse: Secondary | ICD-10-CM

## 2018-12-17 DIAGNOSIS — Z992 Dependence on renal dialysis: Secondary | ICD-10-CM

## 2018-12-17 DIAGNOSIS — R1084 Generalized abdominal pain: Secondary | ICD-10-CM

## 2018-12-17 LAB — CBC
HCT: 32.4 % — ABNORMAL LOW (ref 39.0–52.0)
Hemoglobin: 10.7 g/dL — ABNORMAL LOW (ref 13.0–17.0)
MCH: 36.4 pg — ABNORMAL HIGH (ref 26.0–34.0)
MCHC: 33 g/dL (ref 30.0–36.0)
MCV: 110.2 fL — ABNORMAL HIGH (ref 80.0–100.0)
NRBC: 0 % (ref 0.0–0.2)
Platelets: 83 10*3/uL — ABNORMAL LOW (ref 150–400)
RBC: 2.94 MIL/uL — AB (ref 4.22–5.81)
RDW: 15.5 % (ref 11.5–15.5)
WBC: 4 10*3/uL (ref 4.0–10.5)

## 2018-12-17 LAB — COMPREHENSIVE METABOLIC PANEL
ALBUMIN: 3.4 g/dL — AB (ref 3.5–5.0)
ALT: 9 U/L (ref 0–44)
AST: 16 U/L (ref 15–41)
Alkaline Phosphatase: 71 U/L (ref 38–126)
Anion gap: 16 — ABNORMAL HIGH (ref 5–15)
BUN: 40 mg/dL — ABNORMAL HIGH (ref 6–20)
CO2: 27 mmol/L (ref 22–32)
Calcium: 8.7 mg/dL — ABNORMAL LOW (ref 8.9–10.3)
Chloride: 91 mmol/L — ABNORMAL LOW (ref 98–111)
Creatinine, Ser: 9.98 mg/dL — ABNORMAL HIGH (ref 0.61–1.24)
GFR calc Af Amer: 7 mL/min — ABNORMAL LOW (ref >60)
GFR calc non Af Amer: 6 mL/min — ABNORMAL LOW (ref >60)
GLUCOSE: 92 mg/dL (ref 70–99)
POTASSIUM: 4.4 mmol/L (ref 3.5–5.1)
SODIUM: 134 mmol/L — AB (ref 135–145)
Total Bilirubin: 0.7 mg/dL (ref 0.3–1.2)
Total Protein: 6.6 g/dL (ref 6.5–8.1)

## 2018-12-17 LAB — RENAL FUNCTION PANEL
Albumin: 3.4 g/dL — ABNORMAL LOW (ref 3.5–5.0)
Anion gap: 15 (ref 5–15)
BUN: 41 mg/dL — ABNORMAL HIGH (ref 6–20)
CO2: 25 mmol/L (ref 22–32)
Calcium: 8.7 mg/dL — ABNORMAL LOW (ref 8.9–10.3)
Chloride: 90 mmol/L — ABNORMAL LOW (ref 98–111)
Creatinine, Ser: 10.43 mg/dL — ABNORMAL HIGH (ref 0.61–1.24)
GFR calc Af Amer: 6 mL/min — ABNORMAL LOW (ref >60)
GFR calc non Af Amer: 5 mL/min — ABNORMAL LOW (ref >60)
Glucose, Bld: 104 mg/dL — ABNORMAL HIGH (ref 70–99)
Phosphorus: 6.2 mg/dL — ABNORMAL HIGH (ref 2.5–4.6)
Potassium: 4.5 mmol/L (ref 3.5–5.1)
Sodium: 130 mmol/L — ABNORMAL LOW (ref 135–145)

## 2018-12-17 LAB — FOLATE: Folate: 9.2 ng/mL (ref >5.9)

## 2018-12-17 LAB — PROTIME-INR
INR: 1.12
Prothrombin Time: 14.3 seconds (ref 11.4–15.2)

## 2018-12-17 LAB — APTT: aPTT: 36 seconds (ref 24–36)

## 2018-12-17 MED ORDER — GABAPENTIN 100 MG PO CAPS: 100.0000 mg | ORAL_CAPSULE | Freq: Three times a day (TID) | ORAL | Status: DC | PRN

## 2018-12-17 NOTE — Progress Notes (Signed)
Medicine attending: I examined this patient today and reviewed all pertinent clinical laboratory and imaging data and I concur with the evaluation and management plan as recorded by resident physician Dr Harlow Ohms. Please see separate attending admission note for complete details. Unexplained splenic laceration versus infarction with no history of abdominal trauma which developed after a fall following a syncopal episode where he fell backwards and struck the back of his head.  No signs of abdominal trauma.  He is hemodynamically stable.  Surgery following.  Anticipate conservative management.  He remains hemodynamically stable.  Hemoglobin stable.

## 2018-12-17 NOTE — Progress Notes (Signed)
Patient ID: Brian [name], male   DOB: 01-26-1976, 43 y.o.   MRN: [id number]    Subjective: Less LUQ pain, tolerating diet  Objective: Vital signs in last 24 hours: Temp:  [97.9 F (36.6 C)-98.7 F (37.1 C)] 98.1 F (36.7 C) (01/02 0803) Pulse Rate:  [89-117] 109 (01/02 0803) Resp:  [16-18] 16 (01/02 0355) BP: (109-126)/(74-98) 115/82 (01/02 0803) SpO2:  [90 %-99 %] 96 % (01/02 0803) Weight:  [83.3 kg] 83.3 kg (01/02 0355) Last BM Date: 12/15/18  Intake/Output from previous day: No intake/output data recorded. Intake/Output this shift: No intake/output data recorded.  General appearance: alert and cooperative Resp: clear to auscultation bilaterally Cardio: regular rate and rhythm GI: soft, minimal LUQ tenderness, reducible UH  Lab Results: CBC  Recent Labs    12/16/18 1617 12/17/18 0244  WBC 3.7* 4.0  HGB 11.2* 10.7*  HCT 34.1* 32.4*  PLT 88* 83*   BMET Recent Labs    12/17/18 0244 12/17/18 0921  NA 134* 130*  K 4.4 4.5  CL 91* 90*  CO2 27 25  GLUCOSE 92 104*  BUN 40* 41*  CREATININE 9.98* 10.43*  CALCIUM 8.7* 8.7*   PT/INR Recent Labs    12/17/18 0244  LABPROT 14.3  INR 1.12   ABG No results for input(s): PHART, HCO3 in the last 72 hours.  Invalid input(s): PCO2, PO2  Studies/Results: Ct Abdomen Pelvis Wo Contrast  Result Date: 12/16/2018 CLINICAL DATA:  Abdominal pain. EXAM: CT ABDOMEN AND PELVIS WITHOUT CONTRAST TECHNIQUE: Multidetector CT imaging of the abdomen and pelvis was performed following the standard protocol without IV contrast. COMPARISON:  CT scan dated 03/07/2018 FINDINGS: Lower chest: There is new cardiomegaly. Emphysematous changes at the lung bases. New slight accentuation of the interstitial markings at both bases with new slight atelectasis at the left base posteriorly. Hepatobiliary: Numerous cysts throughout the liver consistent with polycystic liver and kidney disease. Biliary tree is normal. Pancreas: Unremarkable. No  pancreatic ductal dilatation or surrounding inflammatory changes. Spleen: There is a new small focal splenic laceration at the posterosuperior aspect of the spleen with a small amount of adjacent fluid which probably represents subacute hemorrhage. This accounts for the atelectasis at the left lung base. No visible adjacent rib fractures. Adrenals/Urinary Tract: Right adrenal gland is normal. Left adrenal gland is not visible. Left nephrectomy. Severe polycystic disease of the right kidney. The kidney is markedly enlarged with a mass effect upon the adjacent structures. Stomach/Bowel: Stomach is within normal limits. Appendix is not visualized. No evidence of bowel wall thickening, distention, or inflammatory changes. Vascular/Lymphatic: Aortic atherosclerosis. No enlarged abdominal or pelvic lymph nodes. Reproductive: Prostate is unremarkable. Other: There is a small amount of high-density fluid in the pelvis consistent with hemorrhage. Musculoskeletal: No acute or significant osseous findings. Old slight anterior wedge deformity of L2. Slight sclerosis of the femoral heads without definitive avascular necrosis. IMPRESSION: 1. Small focal splenic laceration at the posterosuperior aspect of the spleen with a small amount of adjacent fluid. 2. Small  amount of hemorrhage in the pelvis. 3. Polycystic liver and kidney disease. Critical Value/emergent results were called by telephone at the time of interpretation on 12/16/2018 at 5:11 pm to [place], PA-C , who verbally acknowledged these results. Aortic Atherosclerosis (ICD10-I70.0). Electronically Signed   By: Lorriane Shire M.D.   On: 12/16/2018 17:14    Anti-infectives: Anti-infectives (From admission, onward)   Start     Dose/Rate Route Frequency Ordered Stop   12/17/18 0700  emtricitabine-tenofovir AF (DESCOVY)  200-25 MG per tablet 1 tablet     1 tablet Oral Daily with breakfast 12/16/18 2059     12/17/18 0700  dolutegravir (TIVICAY) tablet 50 mg      50 mg Oral Daily with breakfast 12/16/18 2059        Assessment/Plan: Fall Grade 2 spleen lac - doing well. CBC in AM.  ESRD - per primary and Renal   LOS: 1 day    Georganna Skeans, MD, MPH, FACS Trauma: [phone] General Surgery: [phone]  12/17/2018

## 2018-12-17 NOTE — Progress Notes (Addendum)
   Subjective: Patient was seen laying in bed comfortably this morning. No overnight events. He stated he normally sits down when he starts feeling lightheaded after HD however this time he tried gripping on to something before he fell down. He thinks he hit his head on his stereo box because he had a big knot on the back of his head that he reports is improving. He noticed a couple hours later he was having severe diffuse abdominal pain that is worst on the LUQ. He noticed some blood in his sinuses but denies any blood in his vomit. He said he chronically has dark stools and has not noticed blood in his stool.  Objective:  Vital signs in last 24 hours: Vitals:   12/16/18 2153 12/16/18 2200 12/17/18 0355 12/17/18 0803  BP:   109/79 115/82  Pulse:   (!) 101 (!) 109  Resp:  16 16   Temp:  98.7 F (37.1 C) 98.7 F (37.1 C) 98.1 F (36.7 C)  TempSrc:   Oral Oral  SpO2:  97%  96%  Weight: 83.3 kg  83.3 kg   Height: 6\' 3"  (1.905 m)  6\' 3"  (1.905 m)    Physical Exam  Constitutional: He is oriented to person, place, and time and well-developed, well-nourished, and in no distress.  Cardiovascular: Normal rate, regular rhythm and normal heart sounds.  No murmur heard. Pulmonary/Chest: Effort normal. No respiratory distress. He has no wheezes.  Abdominal: Soft. Bowel sounds are normal. He exhibits no distension. There is abdominal tenderness. There is no rebound and no guarding.  Diffuse tenderness TP, LUQ > other quadrants, enlarged spleen  Musculoskeletal:        General: No edema.  Neurological: He is alert and oriented to person, place, and time.  Skin: Skin is warm and dry.  Psychiatric: Mood, memory, affect and judgment normal.    Assessment/Plan:  Active Problems:   Splenic infarct  43yo male with HIV, PCKD and polycystic liver s/p left nephrectomy, ESRD on HD MWF, HFrEF (EF 20-25%) recurrent hypotension with syncopal episodes presenting with sharp LUQ abdominal pain after syncopal  episode.  Mild Hemoperitoneum and Splenic Infarct s/p syncopal fall CT abdomen showed small splenic laceration with small amount of adjacent fluid. Discussed imaging with radiology and given patient did not have any force or blunt trauma more likely an infarct with some hemorrhage. Vitals remain stable and pain is controlled. CBC stable with thrombocytopenia, likely 2/2 PCKD and ESRD. Orthostatics wnl. Surgery consulted and currently recommending observation. Plan to observe patient for another 24 hours. - general surgery consulted, appreciate recommendations - am CBC - Percocet 5mg  for pain prn  - SCDs for VTE  ESRD on HD MWF Last HD 12/31. Nephrology consulted for possible HD tomorrow. - continue Ferric citrate binder   HIV - continue Ticavay, descovy  Acute Mild Leukopenia CBC 4.0 -am CBC   Dispo: Anticipated discharge in approximately 1 day(s).   Mike Craze, DO 12/17/2018, 10:54 AM Pager: [phone]
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
## 2018-12-17 NOTE — Progress Notes (Signed)
Pt requested pain medicine.  When this RN entered patient's room to explain he could not have anything for pain until 0115, patient was asleep.

## 2018-12-17 NOTE — Plan of Care (Signed)

## 2018-12-18 ENCOUNTER — Other Ambulatory Visit: Payer: Self-pay | Admitting: Internal Medicine

## 2018-12-18 DIAGNOSIS — D509 Iron deficiency anemia, unspecified: Secondary | ICD-10-CM | POA: Diagnosis not present

## 2018-12-18 DIAGNOSIS — D631 Anemia in chronic kidney disease: Secondary | ICD-10-CM | POA: Diagnosis not present

## 2018-12-18 DIAGNOSIS — N2581 Secondary hyperparathyroidism of renal origin: Secondary | ICD-10-CM | POA: Diagnosis not present

## 2018-12-18 DIAGNOSIS — N186 End stage renal disease: Secondary | ICD-10-CM | POA: Diagnosis not present

## 2018-12-18 LAB — CBC
HCT: 30.5 % — ABNORMAL LOW (ref 39.0–52.0)
Hemoglobin: 10 g/dL — ABNORMAL LOW (ref 13.0–17.0)
MCH: 35.3 pg — ABNORMAL HIGH (ref 26.0–34.0)
MCHC: 32.8 g/dL (ref 30.0–36.0)
MCV: 107.8 fL — AB (ref 80.0–100.0)
PLATELETS: 84 10*3/uL — AB (ref 150–400)
RBC: 2.83 MIL/uL — AB (ref 4.22–5.81)
RDW: 15.1 % (ref 11.5–15.5)
WBC: 3.6 10*3/uL — ABNORMAL LOW (ref 4.0–10.5)
nRBC: 0 % (ref 0.0–0.2)

## 2018-12-18 NOTE — Progress Notes (Signed)
   Subjective: Brian [name] was seen sitting up at the bedside this morning. No overnight events. He reported he is still having abdominal pain but it is improving. He is noticing blood in his sinuses. He is tolerating PO intake.  Objective:  Vital signs in last 24 hours: Vitals:   12/17/18 0355 12/17/18 0803 12/17/18 1724 12/17/18 2315  BP: 109/79 115/82 129/88 117/87  Pulse: (!) 101 (!) 109 (!) 107 (!) 105  Resp: 16   15  Temp: 98.7 F (37.1 C) 98.1 F (36.7 C) 97.8 F (36.6 C) 98.1 F (36.7 C)  TempSrc: Oral Oral Oral Oral  SpO2:  96% 96% 96%  Weight: 83.3 kg     Height: 6\' 3"  (1.905 m)      Physical Exam  Constitutional: He is oriented to person, place, and time and well-developed, well-nourished, and in no distress.  Cardiovascular: Normal rate, regular rhythm and normal heart sounds.  No murmur heard. Pulmonary/Chest: Effort normal and breath sounds normal. No respiratory distress. He has no wheezes.  Abdominal: Soft. Bowel sounds are normal. He exhibits no distension. There is abdominal tenderness. There is no guarding.  Musculoskeletal:        General: No edema.  Neurological: He is alert and oriented to person, place, and time.  Skin: Skin is warm and dry.  Psychiatric: Mood, memory, affect and judgment normal.    Assessment/Plan:  Active Problems:   HIV disease (HCC)   ESRD (end stage renal disease) (HCC)   Leukopenia   Hereditary and idiopathic peripheral neuropathy   Splenic infarct   Generalized abdominal pain   Thrombocytopenia (HCC)   Nonischemic cardiomyopathy (HCC)   Recurrent syncope  43yo male with HIV, PCKD and polycystic liver s/p left nephrectomy, ESRD on HD MWF, HFrEF (EF 20-25%) recurrent hypotension with syncopal episodes presenting with sharp LUQ abdominal pain after syncopal episode.  Mild HemoperitoneumandSplenic Infarct vs laceration s/p syncopal fall CBC stable. Nephrology recommended holding heparin at HD due to thrombocytopenia,  platelets 83, previously noted to be 108-120. Patient's abdominal pain is improving. Plan to follow up closely outpatient.  - general surgery consulted, appreciate recommendations  ESRD on HD MWF HD today at outpatient HD center. - continue Ferric citrate binder   HIV - continue Ticavay, descovy  AcuteMildLeukopenia WBC 3.6, stable  Dispo: Patient is medically stable for discharge today.  Mike Craze, DO 12/18/2018, 6:39 AM Pager: [phone]

## 2018-12-18 NOTE — Plan of Care (Signed)

## 2018-12-18 NOTE — Discharge Instructions (Signed)
Thank you for allowing [place] to provide your care. Please go to HD this afternoon. Your abdominal pain is due to what is called a splenic infarction. It will be important to continue to follow with your PCP and the office will be calling you to schedule an appointment.

## 2018-12-18 NOTE — Progress Notes (Signed)
[facility] Progress Note     Subjective: CC: splenic laceration Patient reports LUQ still mildly sore but improving. Tolerating diet and having flatus. Denies nausea. Reports he is being discharged today.   Objective: Vital signs in last 24 hours: Temp:  [97.8 F (36.6 C)-98.1 F (36.7 C)] 98.1 F (36.7 C) (01/02 2315) Pulse Rate:  [105-107] 105 (01/02 2315) Resp:  [15] 15 (01/02 2315) BP: (117-129)/(87-88) 117/87 (01/02 2315) SpO2:  [96 %] 96 % (01/02 2315) Last BM Date: 12/15/18  Intake/Output from previous day: No intake/output data recorded. Intake/Output this shift: No intake/output data recorded.  PE: Gen:  Alert, NAD, pleasant Card:  Regular rate and rhythm Pulm:  Normal effort, clear to auscultation bilaterally Abd: Soft, mildly TTP in LUQ, non-distended, bowel sounds present, no HSM Skin: warm and dry, no rashes  Psych: A&Ox3   Lab Results:  Recent Labs    12/17/18 0244 12/18/18 0300  WBC 4.0 3.6*  HGB 10.7* 10.0*  HCT 32.4* 30.5*  PLT 83* 84*   BMET Recent Labs    12/17/18 0244 12/17/18 0921  NA 134* 130*  K 4.4 4.5  CL 91* 90*  CO2 27 25  GLUCOSE 92 104*  BUN 40* 41*  CREATININE 9.98* 10.43*  CALCIUM 8.7* 8.7*   PT/INR Recent Labs    12/17/18 0244  LABPROT 14.3  INR 1.12   CMP     Component Value Date/Time   NA 130 (L) 12/17/2018 0921   K 4.5 12/17/2018 0921   CL 90 (L) 12/17/2018 0921   CO2 25 12/17/2018 0921   GLUCOSE 104 (H) 12/17/2018 0921   BUN 41 (H) 12/17/2018 0921   CREATININE 10.43 (H) 12/17/2018 0921   CREATININE 9.98 (H) 03/10/2018 1044   CALCIUM 8.7 (L) 12/17/2018 0921   CALCIUM 8.4 10/14/2011 1432   PROT 6.6 12/17/2018 0244   ALBUMIN 3.4 (L) 12/17/2018 0921   AST 16 12/17/2018 0244   ALT 9 12/17/2018 0244   ALKPHOS 71 12/17/2018 0244   BILITOT 0.7 12/17/2018 0244   GFRNONAA 5 (L) 12/17/2018 0921   GFRNONAA 8 (L) 12/22/2014 1526   GFRAA 6 (L) 12/17/2018 0921   GFRAA 9 (L) 12/22/2014 1526    Lipase     Component Value Date/Time   LIPASE 25 12/16/2018 1617       Studies/Results: Ct Abdomen Pelvis Wo Contrast  Result Date: 12/16/2018 CLINICAL DATA:  Abdominal pain. EXAM: CT ABDOMEN AND PELVIS WITHOUT CONTRAST TECHNIQUE: Multidetector CT imaging of the abdomen and pelvis was performed following the standard protocol without IV contrast. COMPARISON:  CT scan dated 03/07/2018 FINDINGS: Lower chest: There is new cardiomegaly. Emphysematous changes at the lung bases. New slight accentuation of the interstitial markings at both bases with new slight atelectasis at the left base posteriorly. Hepatobiliary: Numerous cysts throughout the liver consistent with polycystic liver and kidney disease. Biliary tree is normal. Pancreas: Unremarkable. No pancreatic ductal dilatation or surrounding inflammatory changes. Spleen: There is a new small focal splenic laceration at the posterosuperior aspect of the spleen with a small amount of adjacent fluid which probably represents subacute hemorrhage. This accounts for the atelectasis at the left lung base. No visible adjacent rib fractures. Adrenals/Urinary Tract: Right adrenal gland is normal. Left adrenal gland is not visible. Left nephrectomy. Severe polycystic disease of the right kidney. The kidney is markedly enlarged with a mass effect upon the adjacent structures. Stomach/Bowel: Stomach is within normal limits. Appendix is not visualized. No evidence of bowel wall thickening,  distention, or inflammatory changes. Vascular/Lymphatic: Aortic atherosclerosis. No enlarged abdominal or pelvic lymph nodes. Reproductive: Prostate is unremarkable. Other: There is a small amount of high-density fluid in the pelvis consistent with hemorrhage. Musculoskeletal: No acute or significant osseous findings. Old slight anterior wedge deformity of L2. Slight sclerosis of the femoral heads without definitive avascular necrosis. IMPRESSION: 1. Small focal splenic laceration  at the posterosuperior aspect of the spleen with a small amount of adjacent fluid. 2. Small  amount of hemorrhage in the pelvis. 3. Polycystic liver and kidney disease. Critical Value/emergent results were called by telephone at the time of interpretation on 12/16/2018 at 5:11 pm to [place], PA-C , who verbally acknowledged these results. Aortic Atherosclerosis (ICD10-I70.0). Electronically Signed   By: Lorriane Shire M.D.   On: 12/16/2018 17:14    Anti-infectives: Anti-infectives (From admission, onward)   Start     Dose/Rate Route Frequency Ordered Stop   12/17/18 0700  emtricitabine-tenofovir AF (DESCOVY) 200-25 MG per tablet 1 tablet     1 tablet Oral Daily with breakfast 12/16/18 2059     12/17/18 0700  dolutegravir (TIVICAY) tablet 50 mg     50 mg Oral Daily with breakfast 12/16/18 2059         Assessment/Plan Fall G2 splenic laceration - hgb 10 from 10.7 yesterday, VSS, tolerating diet ESRD - per primary   FEN: renal diet VTE: SCDs ID: ART therapy  Dispo: Agree with d/c home today. Recommend f/u with PCP in 1-2 weeks to recheck CBC and make sure patient tolerating diet and having bowel function.   LOS: 2 days    Brigid Re , [facility] Surgery 12/18/2018, 8:27 AM Pager: [phone] Mon-Fri 7:00 am-4:30 pm Sat-Sun 7:00 am-11:30 am

## 2018-12-18 NOTE — Discharge Summary (Signed)
Name: Brian [name] MRN: [id number] DOB: 1976-01-11 43 y.o. PCP: Ledell Noss, MD  Date of Admission: 12/16/2018  2:49 PM Date of Discharge: 12/18/2018 Attending Physician: No att. providers found  Discharge Diagnosis: 1. Splenic Infarction  2. Syncope  Discharge Medications: Allergies as of 12/18/2018      Reactions   Other    Upper respiratory issues, (sneezing)   Cat Hair Extract Other (See Comments)   SNEEZING      Medication List    TAKE these medications   albuterol 108 (90 Base) MCG/ACT inhaler Commonly known as:  PROAIR HFA Inhale 2 puffs into the lungs every 6 (six) hours as needed for wheezing or shortness of breath.   AURYXIA 1 GM 210 MG(Fe) tablet Generic drug:  ferric citrate Take 210-420 mg by mouth See admin instructions. Take 2 tablets by mouth 3 times daily with meals, and 1 tablet twice daily with snacks   camphor-menthol lotion Commonly known as:  SARNA Apply topically 2 (two) times daily.   DESCOVY 200-25 MG tablet Generic drug:  emtricitabine-tenofovir AF TAKE 1 TABLET BY MOUTH DAILY   diazepam 5 MG tablet Commonly known as:  VALIUM Take 5 mg by mouth at bedtime as needed for sleep.   dronabinol 5 MG capsule Commonly known as:  MARINOL Take 1 capsule (5 mg total) by mouth daily before lunch. Do not fill before 30 days from prior   fluticasone 50 MCG/ACT nasal spray Commonly known as:  FLONASE USE 2 SPRAYS IN EACH NOSTRIL DAILY What changed:    when to take this  reasons to take this   gabapentin 100 MG capsule Commonly known as:  NEURONTIN Take 100 mg by mouth 3 (three) times daily.   GENERLAC 10 GM/15ML Soln Generic drug:  lactulose (encephalopathy) Take 20 g by mouth daily as needed (for constipation).   oxyCODONE-acetaminophen 10-325 MG tablet Commonly known as:  PERCOCET Take 1 tablet by mouth every 8 (eight) hours as needed for pain.   rOPINIRole 0.25 MG tablet Commonly known as:  REQUIP Take 0.25 mg by mouth at bedtime as  needed (restless legs).   TIVICAY 50 MG tablet Generic drug:  dolutegravir TAKE 1 TABLET BY MOUTH DAILY What changed:  how much to take     Disposition and follow-up:   Mr.Briley L Cavenaugh was discharged from [facility] in Stable condition.  At the hospital follow up visit please address:  1.  Splenic Infarction. No need for further intervention at this time including anticoagulation. Consider repeat CT abdomen in 6 weeks to reassess resolution of the intraabdominal free fluid and splenic infarct. Syncope. Seems to correlate with dialysis and likely hypotension but could consider cardiac monitor in the future.   2.  Labs / imaging needed at time of follow-up: CT abdomen in 6 weeks.  3.  Pending labs/ test needing follow-up: None  Follow-up Appointments: Follow-up Information    Ledell Noss, MD Follow up.   Specialty:  Internal Medicine Contact information: [place] [id number] [place] Hospital Course by problem list:  Splenic Infarction. [name] is a 43yo male with HIV, PCKD and polycystic liver s/p left nephrectomy, ESRD on HD MWF, HFrEF (EF 25-30%), recurrent hypotension with syncopal episodes presenting with sharp LUQ abdominal pain after syncopal episode. He was found to be hemodynamically stable and CT abdomen was pursued. This illustrated findings consistent with a splenic infarction, initially called a splenic laceration  but subsequently changed to infarction, and hemoperitoneum. General surgery was consulted and recommended conservative management. He was subsequently admitted for pain control and monitoring. The patient remained stable and pain was well controlled. He was subsequently discharged home in stable condition.    Syncope. Patient was admitted with recurrent syncopal episodes. He has a history of recurrent syncope after dialysis that seems to correlate with dialysis. He does have prodromal symptoms and is typically able to  sit down prior to events. These event he did not try to sit down and just tried to hold onto a chair. These seems consistent with neurocardiogenic etiology or orthostatic hypotension; however, a cardiac monitor could be considered on an outpatient basis.   Discharge Vitals:   BP 117/87 (BP Location: Right Arm)   Pulse (!) 105   Temp 98.1 F (36.7 C) (Oral)   Resp 15   Ht 6\' 3"  (1.905 m)   Wt 83.3 kg   SpO2 96%   BMI 22.95 kg/m   Pertinent Labs, Studies, and Procedures:   CT Abdomen and [place] without contrast  IMPRESSION: 1. Small focal splenic laceration at the posterosuperior aspect of the spleen with a small amount of adjacent fluid. 2. Small  amount of hemorrhage in the pelvis. 3. Polycystic liver and kidney disease.  Discharge Instructions: Discharge Instructions    Diet - low sodium heart healthy   Complete by:  As directed    Increase activity slowly   Complete by:  As directed     Signed: Ina Homes, MD 12/18/2018, 9:10 AM

## 2018-12-21 DIAGNOSIS — N186 End stage renal disease: Secondary | ICD-10-CM | POA: Diagnosis not present

## 2018-12-21 DIAGNOSIS — D631 Anemia in chronic kidney disease: Secondary | ICD-10-CM | POA: Diagnosis not present

## 2018-12-21 DIAGNOSIS — N2581 Secondary hyperparathyroidism of renal origin: Secondary | ICD-10-CM | POA: Diagnosis not present

## 2018-12-21 DIAGNOSIS — D509 Iron deficiency anemia, unspecified: Secondary | ICD-10-CM | POA: Diagnosis not present

## 2018-12-21 NOTE — Consult Note (Signed)
            [facility] CM Primary Care Navigator  12/21/2018  Brian [name] [birth year] [id number]   Attempt to see patient at the bedside to identify possible discharge needs buthe was already discharged home.  Per MD note,patient had recurrent syncopal episodes. On day of admission, he had abrupt syncope, loss of consciousness, struck the back of his head and developed acute severe left upper quadrant abdominal pain when he woke up. (splenic infarction, syncope) General surgery was consulted and recommended conservative management.  Patient has discharge instruction to follow-up with primary care provider in 1 week post hospitalization.  Providers from primary physician's office had been following patient during this admission.  Primary care provider's office is listed as providing transition of care (TOC) follow-up.   For additional questions please contact:  Edwena Felty A. Teri Legacy, BSN, RN-BC [facility] PRIMARY CARE Navigator Cell: [phone]

## 2018-12-23 DIAGNOSIS — D509 Iron deficiency anemia, unspecified: Secondary | ICD-10-CM | POA: Diagnosis not present

## 2018-12-23 DIAGNOSIS — N186 End stage renal disease: Secondary | ICD-10-CM | POA: Diagnosis not present

## 2018-12-23 DIAGNOSIS — N2581 Secondary hyperparathyroidism of renal origin: Secondary | ICD-10-CM | POA: Diagnosis not present

## 2018-12-23 DIAGNOSIS — D631 Anemia in chronic kidney disease: Secondary | ICD-10-CM | POA: Diagnosis not present

## 2018-12-25 DIAGNOSIS — N186 End stage renal disease: Secondary | ICD-10-CM | POA: Diagnosis not present

## 2018-12-25 DIAGNOSIS — N2581 Secondary hyperparathyroidism of renal origin: Secondary | ICD-10-CM | POA: Diagnosis not present

## 2018-12-25 DIAGNOSIS — D631 Anemia in chronic kidney disease: Secondary | ICD-10-CM | POA: Diagnosis not present

## 2018-12-25 DIAGNOSIS — D509 Iron deficiency anemia, unspecified: Secondary | ICD-10-CM | POA: Diagnosis not present

## 2018-12-29 ENCOUNTER — Other Ambulatory Visit: Payer: Self-pay

## 2018-12-29 ENCOUNTER — Encounter: Payer: Self-pay | Admitting: Internal Medicine

## 2018-12-29 ENCOUNTER — Ambulatory Visit (INDEPENDENT_AMBULATORY_CARE_PROVIDER_SITE_OTHER): Payer: Medicare Other | Admitting: Internal Medicine

## 2018-12-29 DIAGNOSIS — F17210 Nicotine dependence, cigarettes, uncomplicated: Secondary | ICD-10-CM | POA: Diagnosis not present

## 2018-12-29 DIAGNOSIS — D735 Infarction of spleen: Secondary | ICD-10-CM | POA: Diagnosis not present

## 2018-12-29 DIAGNOSIS — N2581 Secondary hyperparathyroidism of renal origin: Secondary | ICD-10-CM | POA: Diagnosis not present

## 2018-12-29 DIAGNOSIS — D509 Iron deficiency anemia, unspecified: Secondary | ICD-10-CM | POA: Diagnosis not present

## 2018-12-29 DIAGNOSIS — N186 End stage renal disease: Secondary | ICD-10-CM | POA: Diagnosis not present

## 2018-12-29 DIAGNOSIS — D631 Anemia in chronic kidney disease: Secondary | ICD-10-CM | POA: Diagnosis not present

## 2018-12-29 NOTE — Patient Instructions (Signed)
FOLLOW-UP INSTRUCTIONS When: On February 4th for a routine visit with Dr. Hetty Ely What to bring: All of your medications   I have not made any changes to your medications today.   Today we discussed your recent spleen injury. We will order a repeat CT of the abdomin to be completed between February 14th and 29th.  As always if your symptoms worsen, fail to improve, or you develop other concerning symptoms, please notify our office or visit the local ER if we are unavailable. Symptoms including falls, feeling like you are going to pass out, fever, should not be ignored and should encourage you to visit the ED if we are unavailable by phone or the symptoms are severe.  Thank you for your visit to the [facility] today. If you have any questions or concerns please call us at [phone].
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
## 2018-12-29 NOTE — Assessment & Plan Note (Signed)
Splenic infarct: Patient was admitted on 12/16/2018 for spleen laceration vs infarct. He did not require surgery and was released after observation by day three. He stated that he was standing in his kitchen preparing dinner prior to the injury when he developed the all too common post dialysis presyncopal events. He normally sits until these resolve but opted to attempt to push through that event. Unfortunately, this did not occur and he awoke in the ambulance. There was no evidence of significant head trauma as it appears he passed out prior to falling. A few days later he developed worsening abdominal pain. CT abd in the ER revealed a notable splenic hemorrhage concerning for infarct vs laceration. He was observed and released in stable condition. He feels that he has a good plan to avoid falls in the future. He denied pain today, denied syncope, dizziness, weakness, or lightheadedness.  Plan: CT abd/pelvis ordered for February 14-29th for 6 week interval as recommended Patient to return to see PCP on 01/19/2019 Given strict return precautions

## 2018-12-30 NOTE — Progress Notes (Signed)
Internal Medicine Clinic Attending  Case discussed with Dr. Harbrecht at the time of the visit.  We reviewed the resident's history and exam and pertinent patient test results.  I agree with the assessment, diagnosis, and plan of care documented in the resident's note.   

## 2019-01-01 DIAGNOSIS — N186 End stage renal disease: Secondary | ICD-10-CM | POA: Diagnosis not present

## 2019-01-01 DIAGNOSIS — N2581 Secondary hyperparathyroidism of renal origin: Secondary | ICD-10-CM | POA: Diagnosis not present

## 2019-01-01 DIAGNOSIS — D509 Iron deficiency anemia, unspecified: Secondary | ICD-10-CM | POA: Diagnosis not present

## 2019-01-01 DIAGNOSIS — D631 Anemia in chronic kidney disease: Secondary | ICD-10-CM | POA: Diagnosis not present

## 2019-01-04 DIAGNOSIS — D509 Iron deficiency anemia, unspecified: Secondary | ICD-10-CM | POA: Diagnosis not present

## 2019-01-04 DIAGNOSIS — N2581 Secondary hyperparathyroidism of renal origin: Secondary | ICD-10-CM | POA: Diagnosis not present

## 2019-01-04 DIAGNOSIS — N186 End stage renal disease: Secondary | ICD-10-CM | POA: Diagnosis not present

## 2019-01-04 DIAGNOSIS — D631 Anemia in chronic kidney disease: Secondary | ICD-10-CM | POA: Diagnosis not present

## 2019-01-06 ENCOUNTER — Ambulatory Visit (HOSPITAL_COMMUNITY)
Admission: RE | Admit: 2019-01-06 | Discharge: 2019-01-06 | Disposition: A | Discharge status: Home / Self Care | Payer: Medicare Other | Source: Ambulatory Visit | Attending: Vascular Surgery | Admitting: Vascular Surgery

## 2019-01-06 ENCOUNTER — Ambulatory Visit: Payer: Medicare Other | Admitting: Vascular Surgery

## 2019-01-06 DIAGNOSIS — Z992 Dependence on renal dialysis: Secondary | ICD-10-CM | POA: Insufficient documentation

## 2019-01-06 DIAGNOSIS — N186 End stage renal disease: Secondary | ICD-10-CM | POA: Insufficient documentation

## 2019-01-06 DIAGNOSIS — D631 Anemia in chronic kidney disease: Secondary | ICD-10-CM | POA: Diagnosis not present

## 2019-01-06 DIAGNOSIS — N2581 Secondary hyperparathyroidism of renal origin: Secondary | ICD-10-CM | POA: Diagnosis not present

## 2019-01-06 DIAGNOSIS — D509 Iron deficiency anemia, unspecified: Secondary | ICD-10-CM | POA: Diagnosis not present

## 2019-01-08 DIAGNOSIS — D631 Anemia in chronic kidney disease: Secondary | ICD-10-CM | POA: Diagnosis not present

## 2019-01-08 DIAGNOSIS — D509 Iron deficiency anemia, unspecified: Secondary | ICD-10-CM | POA: Diagnosis not present

## 2019-01-08 DIAGNOSIS — N186 End stage renal disease: Secondary | ICD-10-CM | POA: Diagnosis not present

## 2019-01-08 DIAGNOSIS — N2581 Secondary hyperparathyroidism of renal origin: Secondary | ICD-10-CM | POA: Diagnosis not present

## 2019-01-11 DIAGNOSIS — N186 End stage renal disease: Secondary | ICD-10-CM | POA: Diagnosis not present

## 2019-01-11 DIAGNOSIS — N2581 Secondary hyperparathyroidism of renal origin: Secondary | ICD-10-CM | POA: Diagnosis not present

## 2019-01-11 DIAGNOSIS — D509 Iron deficiency anemia, unspecified: Secondary | ICD-10-CM | POA: Diagnosis not present

## 2019-01-11 DIAGNOSIS — D631 Anemia in chronic kidney disease: Secondary | ICD-10-CM | POA: Diagnosis not present

## 2019-01-12 ENCOUNTER — Encounter: Payer: Self-pay | Admitting: Vascular Surgery

## 2019-01-12 ENCOUNTER — Ambulatory Visit (INDEPENDENT_AMBULATORY_CARE_PROVIDER_SITE_OTHER): Payer: Medicare Other | Admitting: Vascular Surgery

## 2019-01-12 ENCOUNTER — Other Ambulatory Visit: Payer: Self-pay

## 2019-01-12 ENCOUNTER — Other Ambulatory Visit: Payer: Self-pay | Admitting: *Deleted

## 2019-01-12 ENCOUNTER — Encounter: Payer: Self-pay | Admitting: *Deleted

## 2019-01-12 VITALS — BP 123/83 | HR 104 | Temp 97.0°F | Resp 18 | Ht 75.0 in | Wt 187.1 lb

## 2019-01-12 DIAGNOSIS — N186 End stage renal disease: Secondary | ICD-10-CM | POA: Diagnosis not present

## 2019-01-12 DIAGNOSIS — Z992 Dependence on renal dialysis: Secondary | ICD-10-CM
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
## 2019-01-12 NOTE — Progress Notes (Signed)
Scheduled patient for left arm fistulogram on Tuesday 01-19-2019 with Dr. Trula Slade

## 2019-01-13 DIAGNOSIS — N2581 Secondary hyperparathyroidism of renal origin: Secondary | ICD-10-CM | POA: Diagnosis not present

## 2019-01-13 DIAGNOSIS — D509 Iron deficiency anemia, unspecified: Secondary | ICD-10-CM | POA: Diagnosis not present

## 2019-01-13 DIAGNOSIS — D631 Anemia in chronic kidney disease: Secondary | ICD-10-CM | POA: Diagnosis not present

## 2019-01-13 DIAGNOSIS — N186 End stage renal disease: Secondary | ICD-10-CM | POA: Diagnosis not present

## 2019-01-15 DIAGNOSIS — N2581 Secondary hyperparathyroidism of renal origin: Secondary | ICD-10-CM | POA: Diagnosis not present

## 2019-01-15 DIAGNOSIS — N186 End stage renal disease: Secondary | ICD-10-CM | POA: Diagnosis not present

## 2019-01-15 DIAGNOSIS — D509 Iron deficiency anemia, unspecified: Secondary | ICD-10-CM | POA: Diagnosis not present

## 2019-01-15 DIAGNOSIS — D631 Anemia in chronic kidney disease: Secondary | ICD-10-CM | POA: Diagnosis not present

## 2019-01-15 NOTE — Telephone Encounter (Signed)
Opened in error

## 2019-01-16 DIAGNOSIS — I120 Hypertensive chronic kidney disease with stage 5 chronic kidney disease or end stage renal disease: Secondary | ICD-10-CM | POA: Diagnosis not present

## 2019-01-16 DIAGNOSIS — N186 End stage renal disease: Secondary | ICD-10-CM | POA: Diagnosis not present

## 2019-01-16 DIAGNOSIS — Z992 Dependence on renal dialysis: Secondary | ICD-10-CM | POA: Diagnosis not present

## 2019-01-18 DIAGNOSIS — T8183XA Persistent postprocedural fistula, initial encounter: Secondary | ICD-10-CM | POA: Diagnosis not present

## 2019-01-18 DIAGNOSIS — D631 Anemia in chronic kidney disease: Secondary | ICD-10-CM | POA: Diagnosis not present

## 2019-01-18 DIAGNOSIS — N2581 Secondary hyperparathyroidism of renal origin: Secondary | ICD-10-CM | POA: Diagnosis not present

## 2019-01-18 DIAGNOSIS — N186 End stage renal disease: Secondary | ICD-10-CM | POA: Diagnosis not present

## 2019-01-18 DIAGNOSIS — D509 Iron deficiency anemia, unspecified: Secondary | ICD-10-CM | POA: Diagnosis not present

## 2019-01-18 DIAGNOSIS — S51802A Unspecified open wound of left forearm, initial encounter: Secondary | ICD-10-CM | POA: Diagnosis not present

## 2019-01-19 ENCOUNTER — Ambulatory Visit (HOSPITAL_COMMUNITY)
Admission: RE | Admit: 2019-01-19 | Discharge: 2019-01-19 | Disposition: A | Discharge status: Home / Self Care | Payer: Medicare Other | Attending: Surgery | Admitting: Surgery

## 2019-01-19 ENCOUNTER — Other Ambulatory Visit: Payer: Self-pay

## 2019-01-19 ENCOUNTER — Encounter (HOSPITAL_COMMUNITY)
Admission: RE | Disposition: A | Discharge status: Home / Self Care | Payer: Self-pay | Source: Home / Self Care | Attending: Surgery

## 2019-01-19 ENCOUNTER — Ambulatory Visit (INDEPENDENT_AMBULATORY_CARE_PROVIDER_SITE_OTHER): Payer: Medicare Other | Admitting: Internal Medicine

## 2019-01-19 VITALS — BP 104/68 | HR 102 | Temp 97.8°F | Ht 75.0 in | Wt 183.1 lb

## 2019-01-19 DIAGNOSIS — N186 End stage renal disease: Secondary | ICD-10-CM | POA: Diagnosis not present

## 2019-01-19 DIAGNOSIS — K219 Gastro-esophageal reflux disease without esophagitis: Secondary | ICD-10-CM | POA: Insufficient documentation

## 2019-01-19 DIAGNOSIS — Z992 Dependence on renal dialysis: Secondary | ICD-10-CM | POA: Diagnosis not present

## 2019-01-19 DIAGNOSIS — T82898A Other specified complication of vascular prosthetic devices, implants and grafts, initial encounter: Secondary | ICD-10-CM | POA: Diagnosis not present

## 2019-01-19 DIAGNOSIS — Z8249 Family history of ischemic heart disease and other diseases of the circulatory system: Secondary | ICD-10-CM | POA: Diagnosis not present

## 2019-01-19 DIAGNOSIS — F17210 Nicotine dependence, cigarettes, uncomplicated: Secondary | ICD-10-CM | POA: Diagnosis not present

## 2019-01-19 DIAGNOSIS — D735 Infarction of spleen: Secondary | ICD-10-CM | POA: Diagnosis not present

## 2019-01-19 DIAGNOSIS — G2581 Restless legs syndrome: Secondary | ICD-10-CM | POA: Insufficient documentation

## 2019-01-19 DIAGNOSIS — Z86718 Personal history of other venous thrombosis and embolism: Secondary | ICD-10-CM | POA: Insufficient documentation

## 2019-01-19 DIAGNOSIS — Z79899 Other long term (current) drug therapy: Secondary | ICD-10-CM | POA: Insufficient documentation

## 2019-01-19 DIAGNOSIS — I712 Thoracic aortic aneurysm, without rupture, unspecified: Secondary | ICD-10-CM

## 2019-01-19 DIAGNOSIS — I5020 Unspecified systolic (congestive) heart failure: Secondary | ICD-10-CM | POA: Diagnosis not present

## 2019-01-19 DIAGNOSIS — Z86711 Personal history of pulmonary embolism: Secondary | ICD-10-CM | POA: Insufficient documentation

## 2019-01-19 DIAGNOSIS — I120 Hypertensive chronic kidney disease with stage 5 chronic kidney disease or end stage renal disease: Secondary | ICD-10-CM | POA: Diagnosis not present

## 2019-01-19 DIAGNOSIS — D6859 Other primary thrombophilia: Secondary | ICD-10-CM

## 2019-01-19 DIAGNOSIS — G8929 Other chronic pain: Secondary | ICD-10-CM | POA: Diagnosis not present

## 2019-01-19 DIAGNOSIS — J449 Chronic obstructive pulmonary disease, unspecified: Secondary | ICD-10-CM | POA: Diagnosis not present

## 2019-01-19 DIAGNOSIS — G629 Polyneuropathy, unspecified: Secondary | ICD-10-CM | POA: Diagnosis not present

## 2019-01-19 DIAGNOSIS — T82858A Stenosis of vascular prosthetic devices, implants and grafts, initial encounter: Secondary | ICD-10-CM | POA: Insufficient documentation

## 2019-01-19 DIAGNOSIS — Q613 Polycystic kidney, unspecified: Secondary | ICD-10-CM

## 2019-01-19 DIAGNOSIS — Y832 Surgical operation with anastomosis, bypass or graft as the cause of abnormal reaction of the patient, or of later complication, without mention of misadventure at the time of the procedure: Secondary | ICD-10-CM | POA: Insufficient documentation

## 2019-01-19 DIAGNOSIS — I5022 Chronic systolic (congestive) heart failure: Secondary | ICD-10-CM

## 2019-01-19 DIAGNOSIS — Z79891 Long term (current) use of opiate analgesic: Secondary | ICD-10-CM

## 2019-01-19 DIAGNOSIS — Z21 Asymptomatic human immunodeficiency virus [HIV] infection status: Secondary | ICD-10-CM

## 2019-01-19 HISTORY — PX: A/V FISTULAGRAM: CATH118298

## 2019-01-19 HISTORY — PX: PERIPHERAL VASCULAR BALLOON ANGIOPLASTY: CATH118281

## 2019-01-19 LAB — POCT I-STAT 4, (NA,K, GLUC, HGB,HCT)
Glucose, Bld: 79 mg/dL (ref 70–99)
HCT: 42 % (ref 39.0–52.0)
Hemoglobin: 14.3 g/dL (ref 13.0–17.0)
Potassium: 4.6 mmol/L (ref 3.5–5.1)
Sodium: 131 mmol/L — ABNORMAL LOW (ref 135–145)

## 2019-01-19 LAB — POCT I-STAT CREATININE: Creatinine, Ser: 9.1 mg/dL — ABNORMAL HIGH (ref 0.61–1.24)

## 2019-01-19 SURGERY — A/V FISTULAGRAM
Anesthesia: LOCAL | Laterality: Left

## 2019-01-19 MED ORDER — MIDAZOLAM HCL 2 MG/2ML IJ SOLN
INTRAMUSCULAR | Status: AC
  Filled 2019-01-19: qty 2

## 2019-01-19 MED ORDER — FENTANYL CITRATE (PF) 100 MCG/2ML IJ SOLN
INTRAMUSCULAR | Status: DC | PRN
  Administered 2019-01-19 (×3): 25 ug via INTRAVENOUS

## 2019-01-19 MED ORDER — LIDOCAINE HCL (PF) 1 % IJ SOLN
INTRAMUSCULAR | Status: DC | PRN
  Administered 2019-01-19: 3 mL

## 2019-01-19 MED ORDER — HEPARIN SODIUM (PORCINE) 1000 UNIT/ML IJ SOLN
INTRAMUSCULAR | Status: DC | PRN
  Administered 2019-01-19: 3000 [IU] via INTRAVENOUS

## 2019-01-19 MED ORDER — IODIXANOL 320 MG/ML IV SOLN
INTRAVENOUS | Status: DC | PRN
  Administered 2019-01-19: 35 mL via INTRAVENOUS

## 2019-01-19 MED ORDER — SODIUM CHLORIDE 0.9% FLUSH: 3.0000 mL | Freq: Two times a day (BID) | INTRAVENOUS | Status: DC

## 2019-01-19 MED ORDER — LIDOCAINE HCL (PF) 1 % IJ SOLN
INTRAMUSCULAR | Status: AC
  Filled 2019-01-19: qty 30

## 2019-01-19 MED ORDER — MIDAZOLAM HCL 2 MG/2ML IJ SOLN
INTRAMUSCULAR | Status: DC | PRN
  Administered 2019-01-19 (×3): 1 mg via INTRAVENOUS

## 2019-01-19 MED ORDER — FENTANYL CITRATE (PF) 100 MCG/2ML IJ SOLN
INTRAMUSCULAR | Status: AC
  Filled 2019-01-19: qty 2

## 2019-01-19 MED ORDER — HEPARIN (PORCINE) IN NACL 1000-0.9 UT/500ML-% IV SOLN
INTRAVENOUS | Status: DC | PRN
  Administered 2019-01-19: 500 mL

## 2019-01-19 MED ORDER — SODIUM CHLORIDE 0.9% FLUSH: 3.0000 mL | INTRAVENOUS | Status: DC | PRN

## 2019-01-19 MED ORDER — HEPARIN (PORCINE) IN NACL 1000-0.9 UT/500ML-% IV SOLN
INTRAVENOUS | Status: AC
  Filled 2019-01-19: qty 500

## 2019-01-19 MED ORDER — SODIUM CHLORIDE 0.9 % IV SOLN: 250.0000 mL | INTRAVENOUS | Status: DC | PRN

## 2019-01-19 MED ORDER — HEPARIN SODIUM (PORCINE) 1000 UNIT/ML IJ SOLN
INTRAMUSCULAR | Status: AC
  Filled 2019-01-19: qty 1

## 2019-01-19 SURGICAL SUPPLY — 14 items
BALLN MUSTANG 5X60X75 (BALLOONS) ×3
BALLOON MUSTANG 5X60X75 (BALLOONS) IMPLANT
COVER DOME SNAP 22 D (MISCELLANEOUS) ×3 IMPLANT
KIT ENCORE 26 ADVANTAGE (KITS) ×1 IMPLANT
KIT MICROPUNCTURE NIT STIFF (SHEATH) ×1 IMPLANT
PROTECTION STATION PRESSURIZED (MISCELLANEOUS) ×3
SHEATH PINNACLE R/O II 6F 4CM (SHEATH) ×1 IMPLANT
SHEATH PROBE COVER 6X72 (BAG) ×3 IMPLANT
STATION PROTECTION PRESSURIZED (MISCELLANEOUS) ×2 IMPLANT
STOPCOCK MORSE 400PSI 3WAY (MISCELLANEOUS) ×3 IMPLANT
TRAY PV CATH (CUSTOM PROCEDURE TRAY) ×3 IMPLANT
TUBING CIL FLEX 10 FLL-RA (TUBING) ×3 IMPLANT
WIRE BENTSON .035X145CM (WIRE) ×1 IMPLANT
WIRE SPARTACORE .014X190CM (WIRE) ×1 IMPLANT

## 2019-01-19 NOTE — Interval H&P Note (Signed)
History and Physical Interval Note:  01/19/2019 11:23 AM  Brian [name]  has presented today for surgery, with the diagnosis of poor flow in fistula  The various methods of treatment have been discussed with the patient and family. After consideration of risks, benefits and other options for treatment, the patient has consented to  Procedure(s): A/V FISTULAGRAM - Left Arm (Left) as a surgical intervention .  The patient's history has been reviewed, patient examined, no change in status, stable for surgery.  I have reviewed the patient's chart and labs.  Questions were answered to the patient's satisfaction.     Annamarie Major

## 2019-01-19 NOTE — Patient Instructions (Signed)
Thank you for coming to the clinic today. It was a pleasure to see you.   Please call and schedule that follow up CT scan of your abdomen   FOLLOW-UP INSTRUCTIONS When: 3 months with Dr. Hetty Ely  For: follow up of your pain  What to bring: all of your medication bottles   Please call the internal medicine center clinic if you have any questions or concerns, we may be able to help and keep you from a long and expensive emergency room wait. Our clinic and after hours phone number is [phone], the best time to call is Monday through Friday 9 am to 4 pm but there is always someone available 24/7 if you have an emergency. If you need medication refills please notify your pharmacy one week in advance and they will send [place] a request.

## 2019-01-19 NOTE — Discharge Instructions (Signed)
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
## 2019-01-19 NOTE — Op Note (Signed)
    Patient name: Brian [name] MRN: [id number] DOB: Jun 07, 1976 Sex: male  01/19/2019 Pre-operative Diagnosis: Poorly functioning left forearm basilic vein fistula Post-operative diagnosis:  Same Surgeon:  Annamarie Major Procedure Performed:  1.  Ultrasound-guided access, left basilic vein  2.  Fistulogram  3.  Venoplasty, left basilic vein conscious sedation (46 minutes)     Indications: The patient is having trouble with access.  He comes in today for further evaluation.  Ultrasound identified a possible stenosis near the anastomosis at the wrist.  Procedure:  The patient was identified in the holding area and taken to room 8.  The patient was then placed supine on the table and prepped and draped in the usual sterile fashion.  A time out was called.  Conscious sedation was administered with use of IV fentanyl and Versed under continuous physician nurse monitoring.  Heart, blood pressure, and ox saturation were continuously monitored.  Ultrasound was used to evaluate the fistula.  The vein was patent and compressible.  A digital ultrasound image was acquired.  The fistula was then accessed under ultrasound guidance using a micropuncture needle.  An 018 wire was then asvanced without resistance and a micropuncture sheath was placed.  Contrast injections were then performed through the sheath.  Findings: No central venous stenosis.  The basilic vein in the upper arm is widely patent.  Initially, there was spasm around the cannulation site however this resolved with time.  There was a 3.5 cm narrowed segment of the vein near the anastomosis.   Intervention: After the above images were acquired decision made to proceed with intervention.  Over a Bentson wire, a 6 French sheath was placed.  3000 units heparin were given.  The Bentson wire was removed and an 014 [place] core was advanced out into the radial artery.  I selected a 5 x 60 Mustang balloon and perform balloon venoplasty of the proximal  basilic vein.  This was very uncomfortable for the patient.  After a 2-minute inflation at nominal pressure, the balloon was removed.  Completion imaging revealed significant improvement within flow through this proximal portion of the fistula with residual stenosis of approximately 15%.  I elected not to upsize the balloon because of the amount of discomfort the patient had.  Catheters and wires were removed as well as the sheath of her suture closure.  There were no complications.  Impression:  #1  Diffuse segment of stenosis within the proximal portion of the vein, about 80% disease.  A 5 mm balloon was used for angioplasty with residual stenosis less than 15%.  I was reluctant to proceed with a larger balloon because of the amount of pain the patient had.  #2  The patient continues to have difficulty with dialysis consideration for revision of the proximal fistula surgically, or conversion to an upper arm basilic vein fistula.     Theotis Burrow, M.D. [facility] [place] Office: [phone] Pager:  [phone]

## 2019-01-20 ENCOUNTER — Encounter (HOSPITAL_COMMUNITY): Payer: Self-pay | Admitting: Surgery

## 2019-01-20 MED ORDER — OXYCODONE-ACETAMINOPHEN 10-325 MG PO TABS: 1.0000 | ORAL_TABLET | Freq: Two times a day (BID) | ORAL | 0 refills | Status: DC | PRN

## 2019-01-21 ENCOUNTER — Encounter: Payer: Self-pay | Admitting: Internal Medicine

## 2019-01-21 IMAGING — DX DG ELBOW COMPLETE 3+V*R*
4 series · 4 of 4 positions shown · non-contrast
Comparison: None.

CLINICAL DATA: Patient fell through glass table. Right clavicular
fracture. Dialysis access via the right arm.

EXAM:
RIGHT ELBOW - COMPLETE 3+ VIEW

[elbow ap]
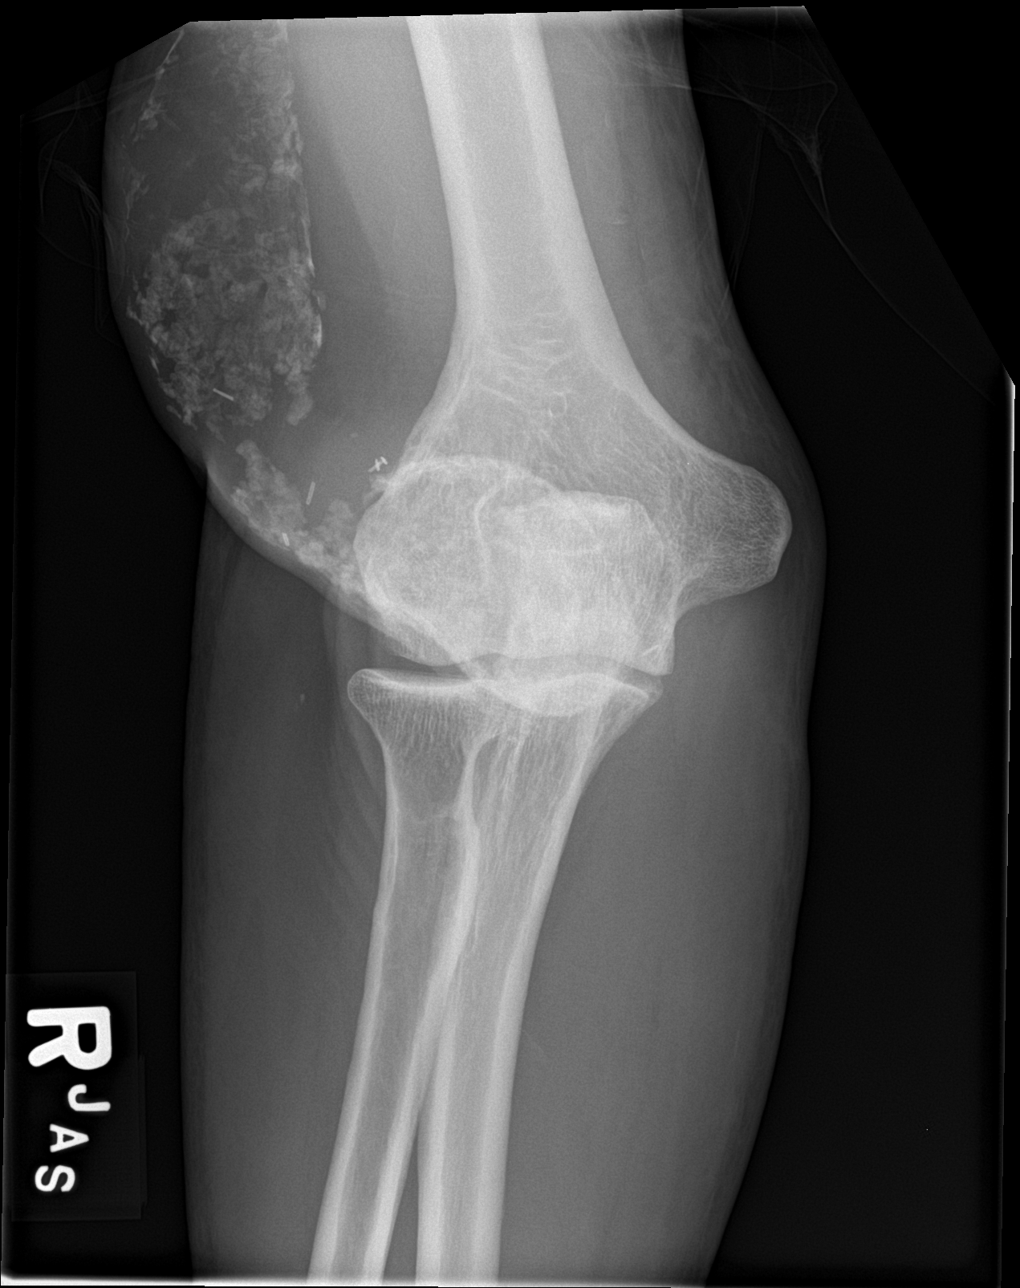

[elbow obl (1 of 2)]
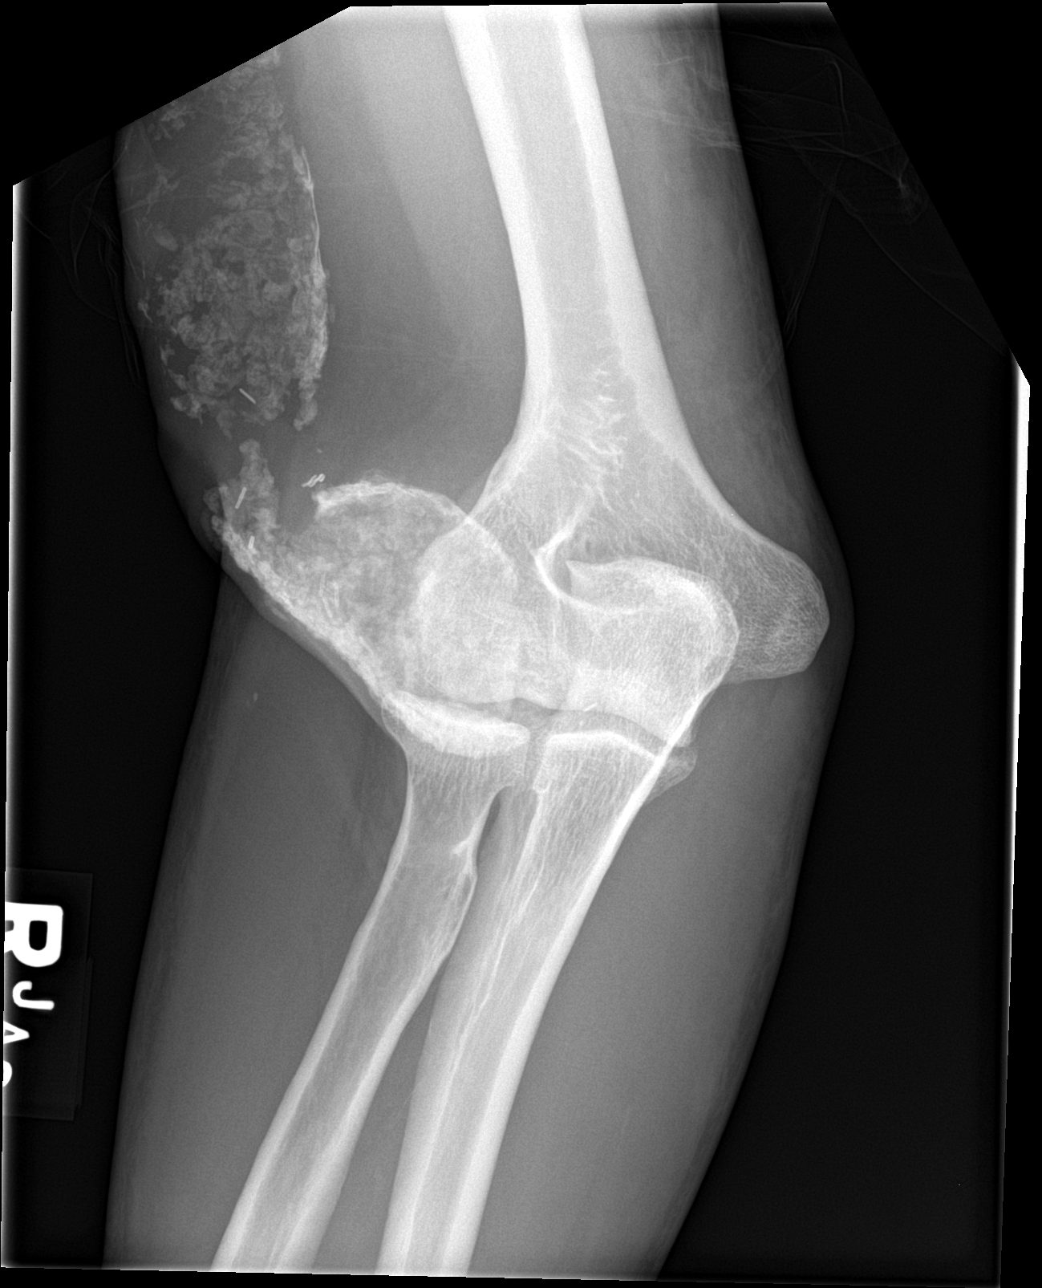

[elbow obl (2 of 2)]
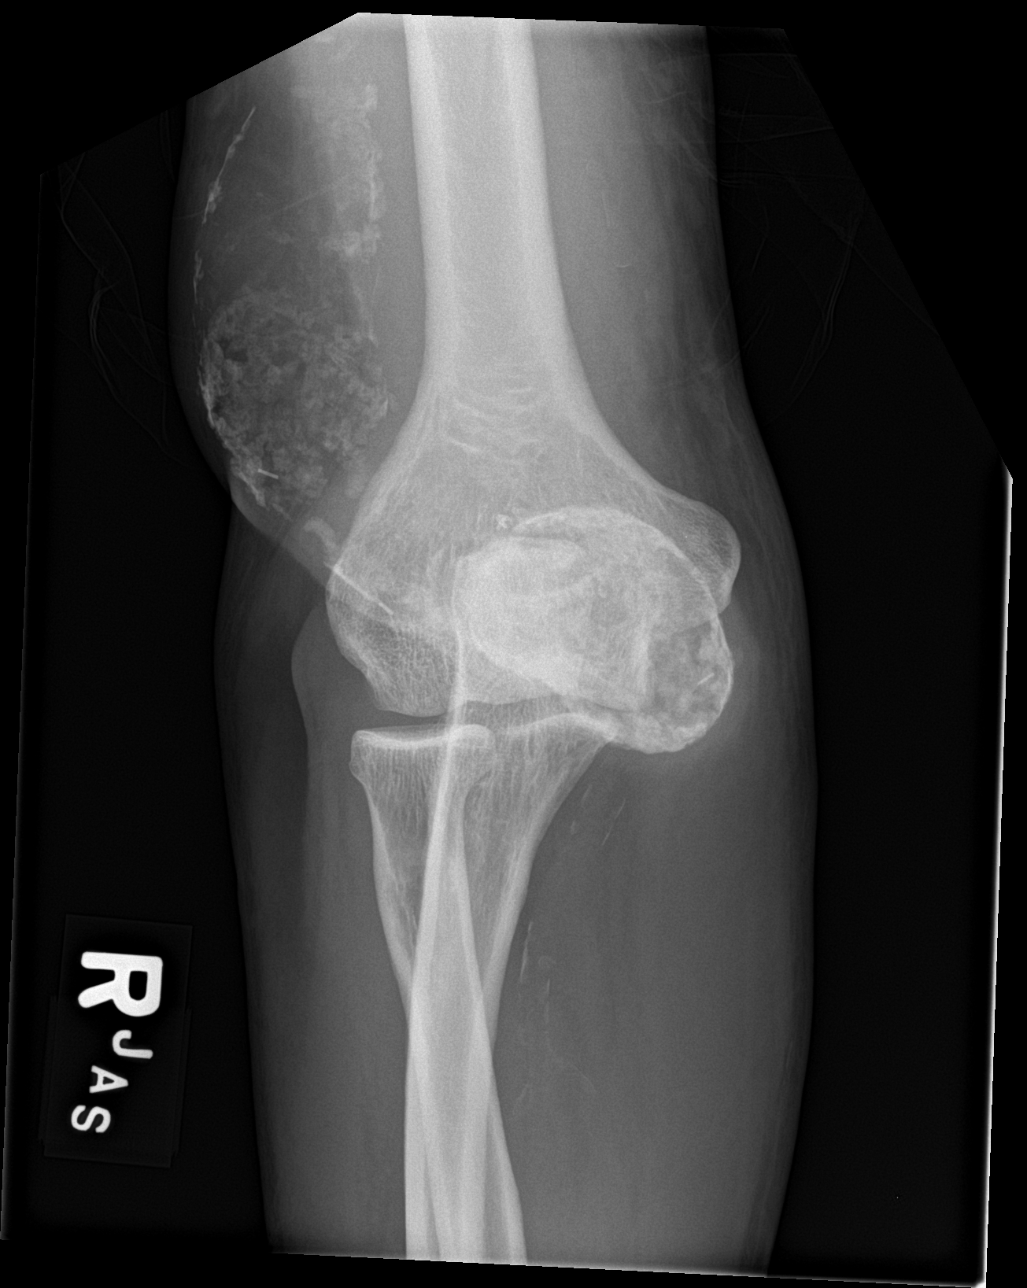

[elbow lat]
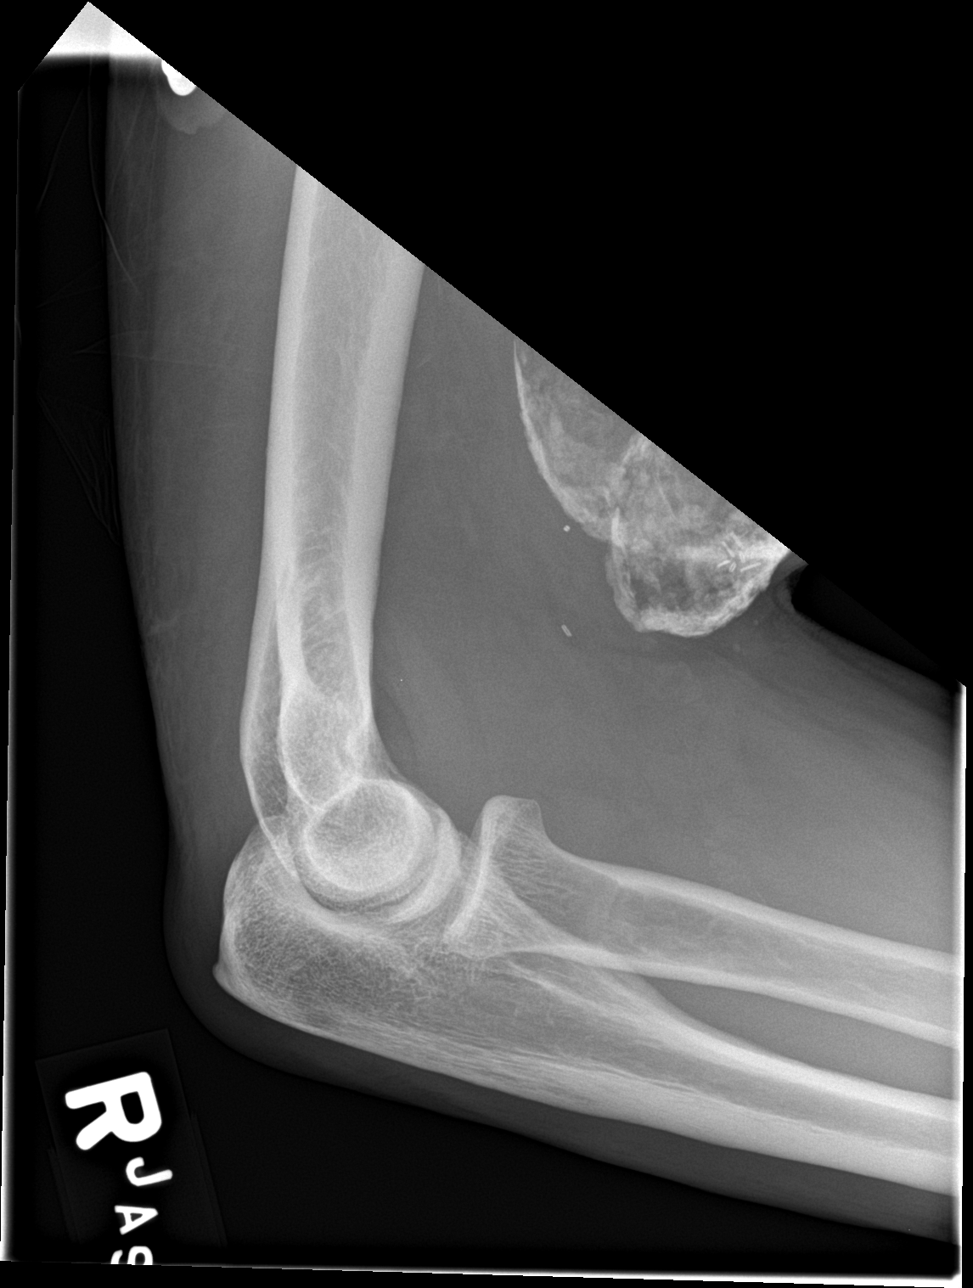

[4 of 4 positions shown; findings below may reference images not displayed]

FINDINGS: Coarse soft tissue calcifications are noted of the included distal
right arm along the volar aspect and can be seen in the setting of
hemodialysis access. Otherwise, the coarse calcifications are
nonspecific and may be associated with prior remote trauma possibly
representing a myositis ossificans or less likely but not entirely
excluded, soft tissue masses and sarcomas. No fracture about the
elbow. No joint dislocation or effusion. Minimal enthesopathy at the
triceps insertion.
IMPRESSION: No acute osseous abnormality of the right elbow. Somewhat
circumscribed coarse calcifications along the volar aspect of the
distal arm is more likely associated with the patient's AV fistula,
or possibly from remote trauma or less likely soft tissue mass. This
can be further correlated with cross-sectional imaging on a
nonemergent basis.

## 2019-01-21 IMAGING — CT CT CERVICAL SPINE W/O CM
5 of 8 series · 12 of 33 positions shown, 13 images · non-contrast
Comparison: 03/10/2018 CT head.  04/23/2015 CT cervical spine.

CLINICAL DATA: 41 y/o  M; fall through table.

EXAM:
CT HEAD WITHOUT CONTRAST
CT CERVICAL SPINE WITHOUT CONTRAST
TECHNIQUE: Multidetector CT imaging of the head and cervical spine was
performed following the standard protocol without intravenous
contrast. Multiplanar CT image reconstructions of the cervical spine
were also generated.

[Series 4: head bone · axial · 0.43mm/px · z∈[-128,-66]mm · 2 of 95 slices shown]
[im 32/95  bone]
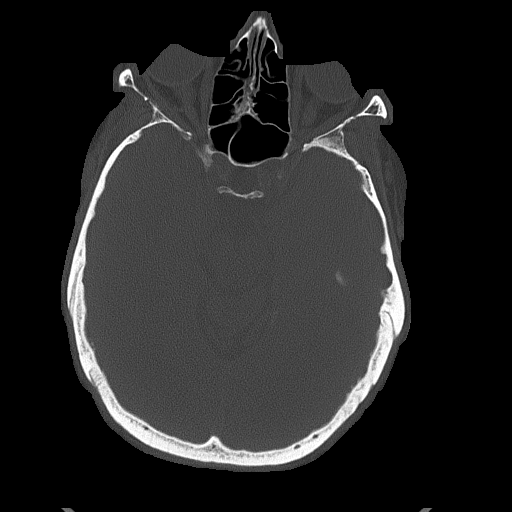
[im 63/95  bone]
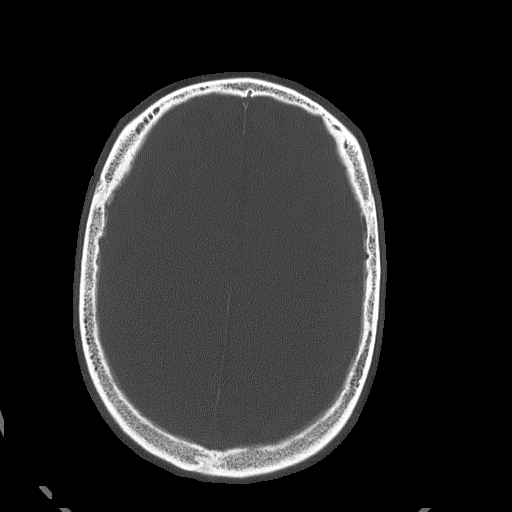

[Series 8: c_spine 2.0 st · axial · 0.27mm/px · z∈[-276,-216]mm · 2 of 91 slices shown, 3 images]
[im 31/91  soft-tissue]
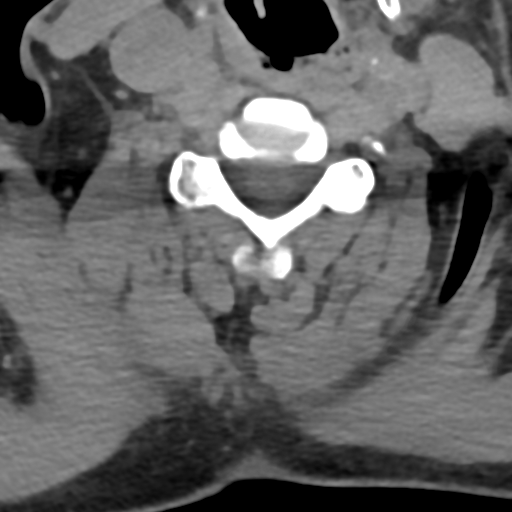
[im 31/91  bone]
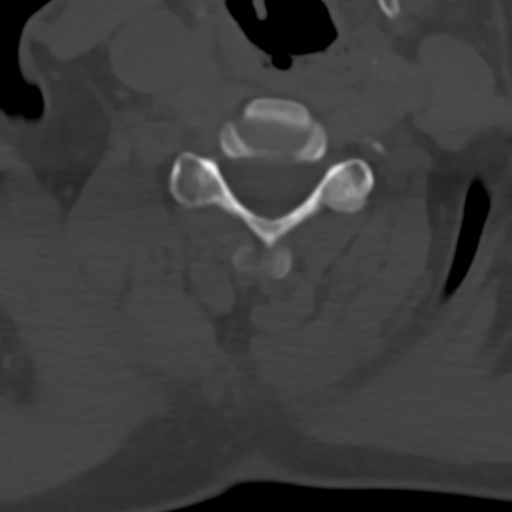
[im 61/91  bone]
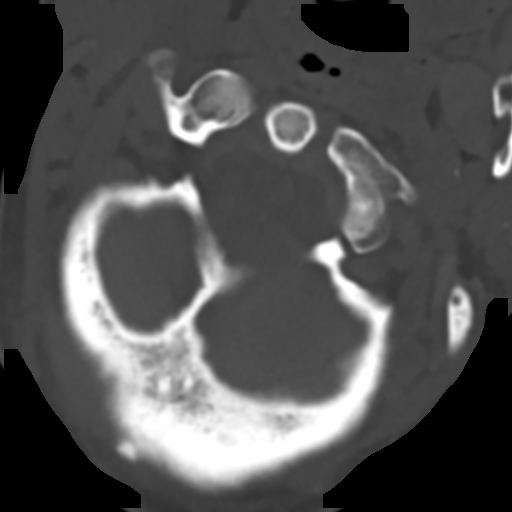

[Series 12: c_spine 2.0 sag bone · sagittal · 0.23mm/px · 5 of 69 slices shown]
[im 12/69  bone]
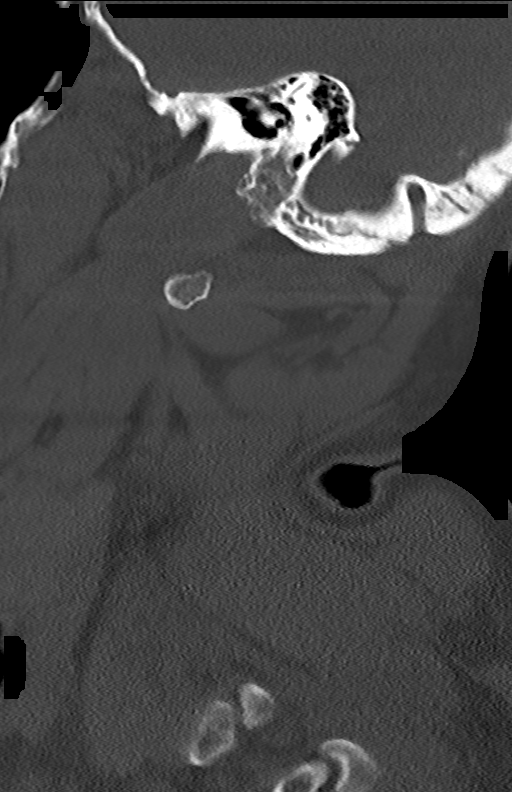
[im 23/69  bone]
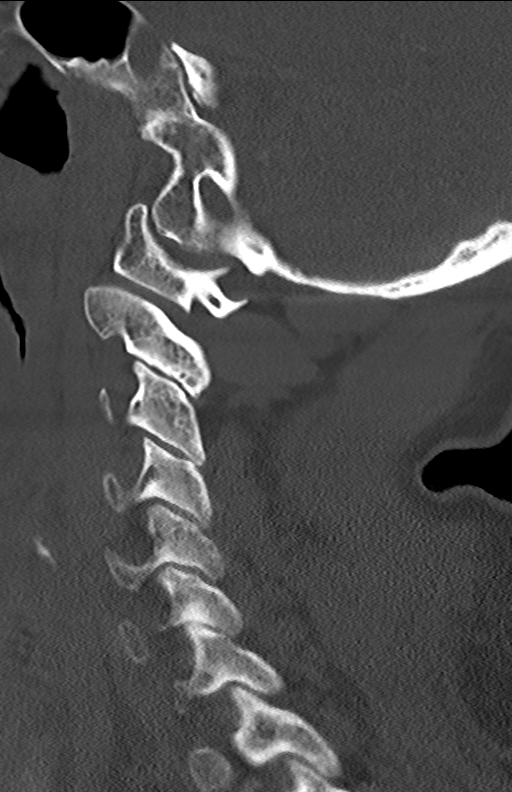
[im 35/69  bone]
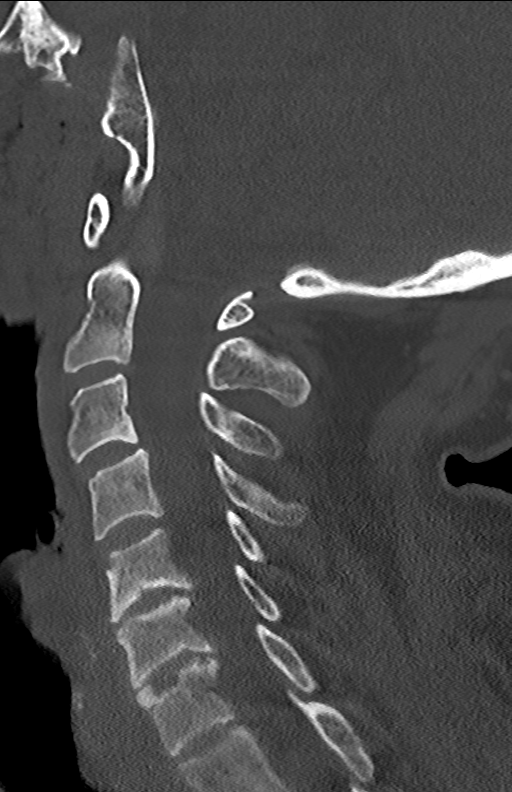
[im 46/69  bone]
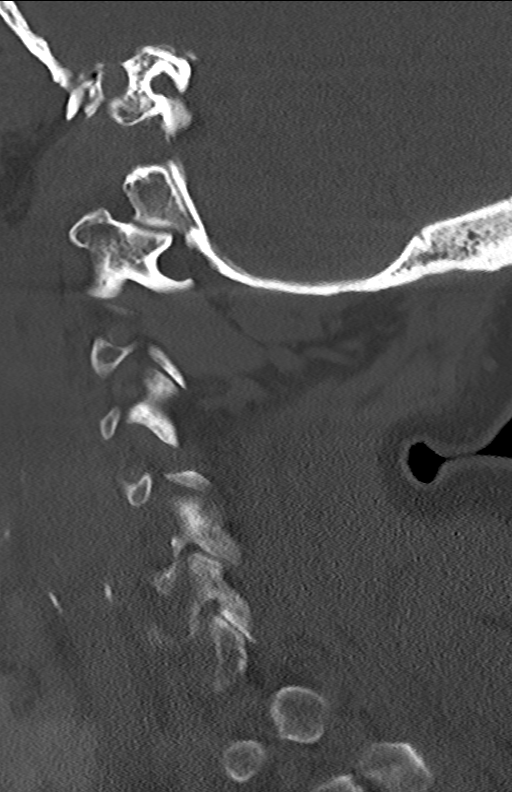
[im 57/69  bone]
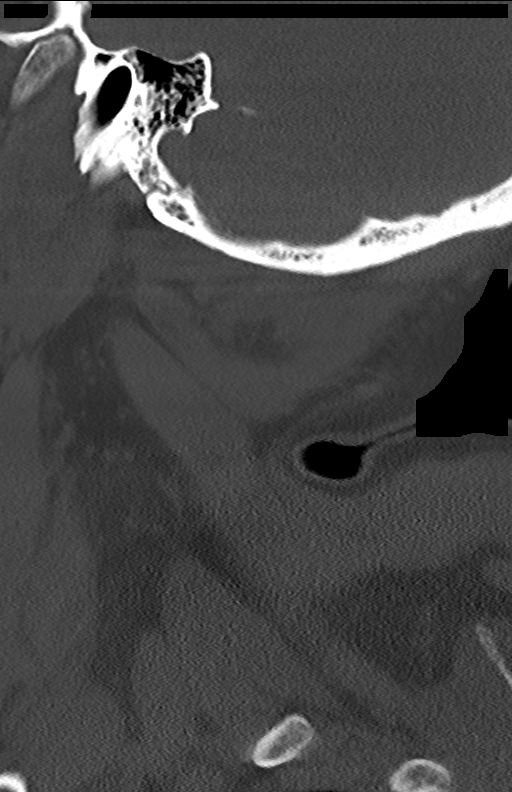

[Series 13: c_spine 2.0 cor bone · coronal · 0.26mm/px · 1 of 61 slices shown]
[im 31/61  bone]
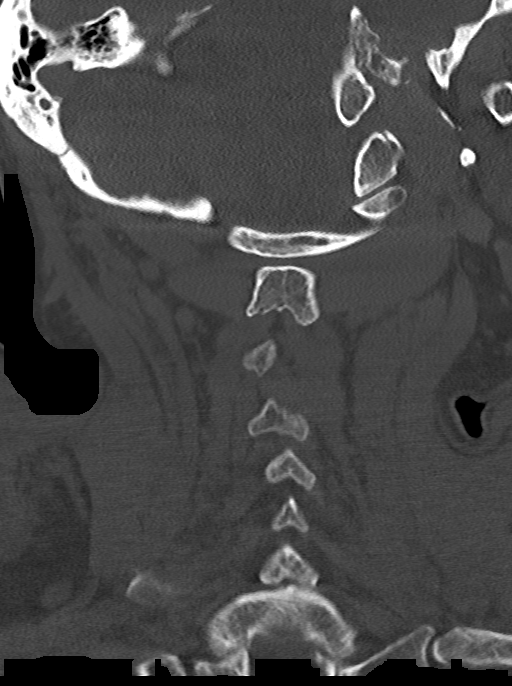

[Series 14: c_spine 2.0 orthogonals · axial · 0.21mm/px · z∈[-300,-242]mm · 2 of 79 slices shown]
[im 27/79  bone]
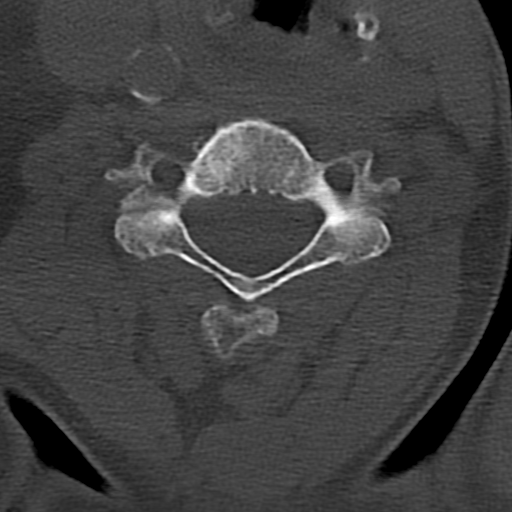
[im 53/79  bone]
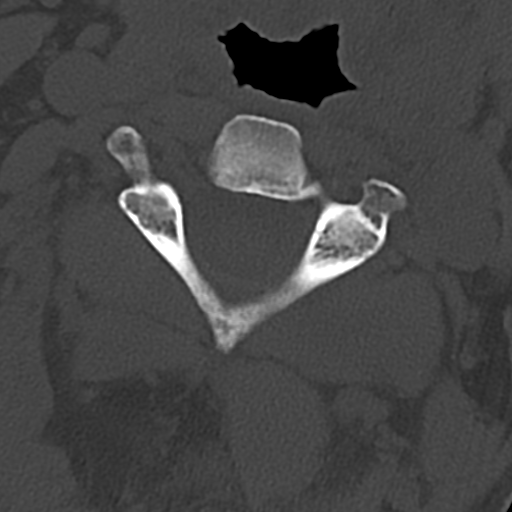

[12 of 33 positions shown; findings below may reference images not displayed]

FINDINGS: CT HEAD FINDINGS

Brain: Interval partial dispersion of right convexity isointense
subdural hematoma measuring up to 7 mm in thickness. Mild sulcal
effacement of the underlying brain and minimal right-to-left midline
shift is decreased. No new acute intracranial hemorrhage, stroke, or
mass effect. Stable background of chronic microvascular ischemic
changes and parenchymal volume loss of the brain.

Vascular: Mild calcific atherosclerosis of carotid siphons.

Skull: Normal. Negative for fracture or focal lesion. Stable
sclerotic focus superior to the right orbit.

Sinuses/Orbits: No acute finding.

Other: None.

CT CERVICAL SPINE FINDINGS

Alignment: Normal cervical lordosis.

Skull base and vertebrae: No acute fracture identified. There is
widening of the right C3-4 facet joint without dislocation which may
represent a facet capsular injury. No listhesis.

Soft tissues and spinal canal: Mild edema within suboccipital
paraspinal muscles and in the in the right posterior base of neck
soft tissues compatible with soft tissue injury.

Disc levels: Moderate discogenic degenerative changes at the C5-C7
levels with endplate Schmorl's nodes.

Upper chest: Negative.

Other: Negative.
IMPRESSION: CT head:

1. Interval decrease in size of the right convexity subdural
hematoma. Minimal decreased mass effect on underlying brain.
2. No new acute intracranial abnormality identified.
3. Chronic microvascular ischemic changes and parenchymal volume
loss of brain are stable.

CT cervical spine:

1. New widening of the right C3-4 facet joint without dislocation
which may represent a facet capsular or ligamentous injury.
2. Mild edema in right posterior base of neck and suboccipital soft
tissues compatible with soft tissue injury.
3. No acute fracture or listhesis.

By: Pierre Herve Cletus M.D.
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
## 2019-01-21 NOTE — Assessment & Plan Note (Signed)
[name] is on chronic opioid therapy for chronic pain. The date of the controlled substances contract is referenced in the [place] and / or the overview. Date of pain contract was 07/2017. As part of the treatment plan, the [place] controlled substance database is checked at least twice yearly and the database results are not appropriate. I have last reviewed the results on today.   The last UDS was on 01/2018 and results are as expected. Patient needs at least a yearly UDS.   The patient is on oxycodone/acetaminophen (Percocet, Tylox) strength 10, 75 per 30 days. Adjunctive treatment includes none, patient declines. This regimen allows Brian [name] to function and does not cause excessive sedation or other side effects.  The benefits of continuing opioid therapy do not outweigh the risks because red flags mentioned in prior chronic opioid follow up visits ".  Interventions today include: UDS and Refills - 3 paper Rx printed - continue with plan to taper, refill provided for percocet 10 mg #60 per 30 days

## 2019-01-21 NOTE — Assessment & Plan Note (Signed)
Incidental finding 10/11. Fusiform. Ascending 4.2 x 4.2 cm.  ECHO 12/2013 4.1 cm  CT chest PE study 03/2018 - slight aneurysmal dilation of the ascending thoracic aorta, 4 cm  He denies chest pain or difficulty breathing related to this.  -Needs yearly screening -Ascending aortic aneurysm will need surgical eval if ever > 7 cm

## 2019-01-22 ENCOUNTER — Encounter (HOSPITAL_COMMUNITY): Payer: Self-pay | Admitting: *Deleted

## 2019-01-22 ENCOUNTER — Other Ambulatory Visit: Payer: Self-pay

## 2019-01-22 ENCOUNTER — Emergency Department (HOSPITAL_COMMUNITY)
Admission: EM | Admit: 2019-01-22 | Discharge: 2019-01-22 | Discharge status: Against Medical Advice | Payer: Medicare Other | Source: Home / Self Care | Attending: Emergency Medicine | Admitting: Emergency Medicine

## 2019-01-22 DIAGNOSIS — Z992 Dependence on renal dialysis: Secondary | ICD-10-CM | POA: Diagnosis not present

## 2019-01-22 DIAGNOSIS — F17210 Nicotine dependence, cigarettes, uncomplicated: Secondary | ICD-10-CM

## 2019-01-22 DIAGNOSIS — I120 Hypertensive chronic kidney disease with stage 5 chronic kidney disease or end stage renal disease: Secondary | ICD-10-CM | POA: Diagnosis not present

## 2019-01-22 DIAGNOSIS — J449 Chronic obstructive pulmonary disease, unspecified: Secondary | ICD-10-CM

## 2019-01-22 DIAGNOSIS — Y829 Unspecified medical devices associated with adverse incidents: Secondary | ICD-10-CM

## 2019-01-22 DIAGNOSIS — N186 End stage renal disease: Secondary | ICD-10-CM | POA: Diagnosis not present

## 2019-01-22 DIAGNOSIS — T82868A Thrombosis of vascular prosthetic devices, implants and grafts, initial encounter: Secondary | ICD-10-CM | POA: Diagnosis not present

## 2019-01-22 DIAGNOSIS — E875 Hyperkalemia: Secondary | ICD-10-CM | POA: Diagnosis not present

## 2019-01-22 DIAGNOSIS — I420 Dilated cardiomyopathy: Secondary | ICD-10-CM | POA: Diagnosis not present

## 2019-01-22 DIAGNOSIS — Z79899 Other long term (current) drug therapy: Secondary | ICD-10-CM

## 2019-01-22 DIAGNOSIS — Q613 Polycystic kidney, unspecified: Secondary | ICD-10-CM | POA: Diagnosis not present

## 2019-01-22 DIAGNOSIS — R000 Tachycardia, unspecified: Secondary | ICD-10-CM | POA: Diagnosis not present

## 2019-01-22 DIAGNOSIS — R0602 Shortness of breath: Secondary | ICD-10-CM | POA: Diagnosis not present

## 2019-01-22 DIAGNOSIS — E872 Acidosis: Secondary | ICD-10-CM | POA: Diagnosis not present

## 2019-01-22 DIAGNOSIS — I132 Hypertensive heart and chronic kidney disease with heart failure and with stage 5 chronic kidney disease, or end stage renal disease: Secondary | ICD-10-CM | POA: Diagnosis not present

## 2019-01-22 LAB — BASIC METABOLIC PANEL
Anion gap: 19 — ABNORMAL HIGH (ref 5–15)
BUN: 74 mg/dL — ABNORMAL HIGH (ref 6–20)
CHLORIDE: 94 mmol/L — AB (ref 98–111)
CO2: 21 mmol/L — ABNORMAL LOW (ref 22–32)
Calcium: 8.2 mg/dL — ABNORMAL LOW (ref 8.9–10.3)
Creatinine, Ser: 14.25 mg/dL — ABNORMAL HIGH (ref 0.61–1.24)
GFR calc Af Amer: 4 mL/min — ABNORMAL LOW (ref >60)
GFR calc non Af Amer: 4 mL/min — ABNORMAL LOW (ref >60)
Glucose, Bld: 117 mg/dL — ABNORMAL HIGH (ref 70–99)
Potassium: 5.1 mmol/L (ref 3.5–5.1)
Sodium: 134 mmol/L — ABNORMAL LOW (ref 135–145)

## 2019-01-22 LAB — CBC WITH DIFFERENTIAL/PLATELET
Abs Immature Granulocytes: 0 10*3/uL (ref 0.00–0.07)
Basophils Absolute: 0 10*3/uL (ref 0.0–0.1)
Basophils Relative: 1 %
Eosinophils Absolute: 0.1 10*3/uL (ref 0.0–0.5)
Eosinophils Relative: 2 %
HCT: 32.7 % — ABNORMAL LOW (ref 39.0–52.0)
Hemoglobin: 10.7 g/dL — ABNORMAL LOW (ref 13.0–17.0)
Immature Granulocytes: 0 %
Lymphocytes Relative: 39 %
Lymphs Abs: 1.2 10*3/uL (ref 0.7–4.0)
MCH: 35.5 pg — ABNORMAL HIGH (ref 26.0–34.0)
MCHC: 32.7 g/dL (ref 30.0–36.0)
MCV: 108.6 fL — ABNORMAL HIGH (ref 80.0–100.0)
MONOS PCT: 11 %
Monocytes Absolute: 0.3 10*3/uL (ref 0.1–1.0)
Neutro Abs: 1.4 10*3/uL — ABNORMAL LOW (ref 1.7–7.7)
Neutrophils Relative %: 47 %
Platelets: 90 10*3/uL — ABNORMAL LOW (ref 150–400)
RBC: 3.01 MIL/uL — ABNORMAL LOW (ref 4.22–5.81)
RDW: 17.5 % — ABNORMAL HIGH (ref 11.5–15.5)
WBC: 3 10*3/uL — ABNORMAL LOW (ref 4.0–10.5)
nRBC: 0 % (ref 0.0–0.2)

## 2019-01-22 LAB — PROTIME-INR
INR: 1.14
Prothrombin Time: 14.5 seconds (ref 11.4–15.2)

## 2019-01-22 MED ORDER — SODIUM ZIRCONIUM CYCLOSILICATE 10 G PO PACK
10.0000 g | PACK | Freq: Once | ORAL | Status: DC
  Filled 2019-01-22: qty 1
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
## 2019-01-22 NOTE — ED Triage Notes (Signed)
Pt in c/o L arm pain since having procedure to have vascular access enlarged on Tuesday with last full HD tx on Monday, pt sent d/t no thrill in L arm access, A&O x4

## 2019-01-22 NOTE — Discharge Instructions (Addendum)
You were seen in the emergency department for thrombosis of your fistula.  You were seen by vascular surgery who recommended that you had a temporary catheter placed he can undergo dialysis until a new fistula can be created.  You did not want to be admitted and have that happen.  You requested to be discharged and plan to follow-up with your dialysis center on Monday.  You are signing out AMA, understanding that you are potassium and other electrolyte problems may worsen during this time and could even lead to death.  Please return if you change your mind about the catheter or or feeling worse in any way and want reevaluation.
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
## 2019-01-22 NOTE — ED Notes (Signed)
Spoke with Brian [name] in lab for request to expedite metabolic panel if possible d/t concern for need for dialysis, MD updated to expect labs in about 40 mins

## 2019-01-22 NOTE — Progress Notes (Signed)
Internal Medicine Clinic Attending  Case discussed with Dr. Blum at the time of the visit.  We reviewed the resident's history and exam and pertinent patient test results.  I agree with the assessment, diagnosis, and plan of care documented in the resident's note. 

## 2019-01-24 ENCOUNTER — Emergency Department (HOSPITAL_COMMUNITY): Payer: Medicare Other

## 2019-01-24 ENCOUNTER — Inpatient Hospital Stay (HOSPITAL_COMMUNITY)
Admission: EM | Admit: 2019-01-24 | Discharge: 2019-01-29 | DRG: 640 | Disposition: A | Discharge status: Home / Self Care | Payer: Medicare Other | Attending: Oncology | Admitting: Oncology

## 2019-01-24 ENCOUNTER — Inpatient Hospital Stay (HOSPITAL_COMMUNITY): Payer: Medicare Other

## 2019-01-24 ENCOUNTER — Encounter (HOSPITAL_COMMUNITY): Payer: Self-pay

## 2019-01-24 ENCOUNTER — Other Ambulatory Visit: Payer: Self-pay

## 2019-01-24 DIAGNOSIS — Z8271 Family history of polycystic kidney: Secondary | ICD-10-CM

## 2019-01-24 DIAGNOSIS — G629 Polyneuropathy, unspecified: Secondary | ICD-10-CM | POA: Diagnosis present

## 2019-01-24 DIAGNOSIS — E872 Acidosis, unspecified: Secondary | ICD-10-CM

## 2019-01-24 DIAGNOSIS — I420 Dilated cardiomyopathy: Secondary | ICD-10-CM | POA: Diagnosis present

## 2019-01-24 DIAGNOSIS — Z992 Dependence on renal dialysis: Secondary | ICD-10-CM | POA: Diagnosis not present

## 2019-01-24 DIAGNOSIS — E875 Hyperkalemia: Principal | ICD-10-CM

## 2019-01-24 DIAGNOSIS — J449 Chronic obstructive pulmonary disease, unspecified: Secondary | ICD-10-CM | POA: Diagnosis present

## 2019-01-24 DIAGNOSIS — G2581 Restless legs syndrome: Secondary | ICD-10-CM | POA: Diagnosis present

## 2019-01-24 DIAGNOSIS — I5020 Unspecified systolic (congestive) heart failure: Secondary | ICD-10-CM | POA: Diagnosis present

## 2019-01-24 DIAGNOSIS — Z905 Acquired absence of kidney: Secondary | ICD-10-CM | POA: Diagnosis not present

## 2019-01-24 DIAGNOSIS — I951 Orthostatic hypotension: Secondary | ICD-10-CM | POA: Diagnosis present

## 2019-01-24 DIAGNOSIS — N2581 Secondary hyperparathyroidism of renal origin: Secondary | ICD-10-CM | POA: Diagnosis not present

## 2019-01-24 DIAGNOSIS — D638 Anemia in other chronic diseases classified elsewhere: Secondary | ICD-10-CM | POA: Diagnosis not present

## 2019-01-24 DIAGNOSIS — D631 Anemia in chronic kidney disease: Secondary | ICD-10-CM | POA: Diagnosis present

## 2019-01-24 DIAGNOSIS — R000 Tachycardia, unspecified: Secondary | ICD-10-CM | POA: Diagnosis not present

## 2019-01-24 DIAGNOSIS — I428 Other cardiomyopathies: Secondary | ICD-10-CM | POA: Diagnosis not present

## 2019-01-24 DIAGNOSIS — Z86718 Personal history of other venous thrombosis and embolism: Secondary | ICD-10-CM

## 2019-01-24 DIAGNOSIS — Z79891 Long term (current) use of opiate analgesic: Secondary | ICD-10-CM

## 2019-01-24 DIAGNOSIS — Z95828 Presence of other vascular implants and grafts: Secondary | ICD-10-CM

## 2019-01-24 DIAGNOSIS — R0602 Shortness of breath: Secondary | ICD-10-CM | POA: Diagnosis not present

## 2019-01-24 DIAGNOSIS — E871 Hypo-osmolality and hyponatremia: Secondary | ICD-10-CM | POA: Diagnosis not present

## 2019-01-24 DIAGNOSIS — Z87442 Personal history of urinary calculi: Secondary | ICD-10-CM

## 2019-01-24 DIAGNOSIS — D696 Thrombocytopenia, unspecified: Secondary | ICD-10-CM | POA: Diagnosis present

## 2019-01-24 DIAGNOSIS — T82868A Thrombosis of vascular prosthetic devices, implants and grafts, initial encounter: Secondary | ICD-10-CM | POA: Diagnosis present

## 2019-01-24 DIAGNOSIS — M549 Dorsalgia, unspecified: Secondary | ICD-10-CM | POA: Diagnosis present

## 2019-01-24 DIAGNOSIS — Y832 Surgical operation with anastomosis, bypass or graft as the cause of abnormal reaction of the patient, or of later complication, without mention of misadventure at the time of the procedure: Secondary | ICD-10-CM | POA: Diagnosis present

## 2019-01-24 DIAGNOSIS — I5022 Chronic systolic (congestive) heart failure: Secondary | ICD-10-CM | POA: Diagnosis not present

## 2019-01-24 DIAGNOSIS — Z86711 Personal history of pulmonary embolism: Secondary | ICD-10-CM

## 2019-01-24 DIAGNOSIS — F329 Major depressive disorder, single episode, unspecified: Secondary | ICD-10-CM | POA: Diagnosis present

## 2019-01-24 DIAGNOSIS — G8921 Chronic pain due to trauma: Secondary | ICD-10-CM | POA: Diagnosis present

## 2019-01-24 DIAGNOSIS — N186 End stage renal disease: Secondary | ICD-10-CM

## 2019-01-24 DIAGNOSIS — Z21 Asymptomatic human immunodeficiency virus [HIV] infection status: Secondary | ICD-10-CM | POA: Diagnosis present

## 2019-01-24 DIAGNOSIS — B20 Human immunodeficiency virus [HIV] disease: Secondary | ICD-10-CM | POA: Diagnosis not present

## 2019-01-24 DIAGNOSIS — I10 Essential (primary) hypertension: Secondary | ICD-10-CM | POA: Diagnosis not present

## 2019-01-24 DIAGNOSIS — N184 Chronic kidney disease, stage 4 (severe): Secondary | ICD-10-CM | POA: Diagnosis not present

## 2019-01-24 DIAGNOSIS — I4891 Unspecified atrial fibrillation: Secondary | ICD-10-CM | POA: Diagnosis present

## 2019-01-24 DIAGNOSIS — Z7951 Long term (current) use of inhaled steroids: Secondary | ICD-10-CM

## 2019-01-24 DIAGNOSIS — Z8249 Family history of ischemic heart disease and other diseases of the circulatory system: Secondary | ICD-10-CM

## 2019-01-24 DIAGNOSIS — F17210 Nicotine dependence, cigarettes, uncomplicated: Secondary | ICD-10-CM | POA: Diagnosis present

## 2019-01-24 DIAGNOSIS — I132 Hypertensive heart and chronic kidney disease with heart failure and with stage 5 chronic kidney disease, or end stage renal disease: Secondary | ICD-10-CM | POA: Diagnosis present

## 2019-01-24 DIAGNOSIS — Z7982 Long term (current) use of aspirin: Secondary | ICD-10-CM

## 2019-01-24 DIAGNOSIS — Z79899 Other long term (current) drug therapy: Secondary | ICD-10-CM

## 2019-01-24 DIAGNOSIS — Z9115 Patient's noncompliance with renal dialysis: Secondary | ICD-10-CM

## 2019-01-24 DIAGNOSIS — K219 Gastro-esophageal reflux disease without esophagitis: Secondary | ICD-10-CM | POA: Diagnosis present

## 2019-01-24 DIAGNOSIS — Z01810 Encounter for preprocedural cardiovascular examination: Secondary | ICD-10-CM | POA: Diagnosis not present

## 2019-01-24 DIAGNOSIS — Q613 Polycystic kidney, unspecified: Secondary | ICD-10-CM | POA: Diagnosis not present

## 2019-01-24 DIAGNOSIS — Z8349 Family history of other endocrine, nutritional and metabolic diseases: Secondary | ICD-10-CM

## 2019-01-24 DIAGNOSIS — I120 Hypertensive chronic kidney disease with stage 5 chronic kidney disease or end stage renal disease: Secondary | ICD-10-CM | POA: Diagnosis not present

## 2019-01-24 DIAGNOSIS — Z833 Family history of diabetes mellitus: Secondary | ICD-10-CM

## 2019-01-24 DIAGNOSIS — Z789 Other specified health status: Secondary | ICD-10-CM

## 2019-01-24 DIAGNOSIS — Z452 Encounter for adjustment and management of vascular access device: Secondary | ICD-10-CM | POA: Diagnosis not present

## 2019-01-24 LAB — POCT I-STAT EG7
Acid-base deficit: 10 mmol/L — ABNORMAL HIGH (ref 0.0–2.0)
Acid-base deficit: 9 mmol/L — ABNORMAL HIGH (ref 0.0–2.0)
Acid-base deficit: 6 mmol/L — ABNORMAL HIGH (ref 0.0–2.0)
Bicarbonate: 14.3 mmol/L — ABNORMAL LOW (ref 20.0–28.0)
Bicarbonate: 15.5 mmol/L — ABNORMAL LOW (ref 20.0–28.0)
Bicarbonate: 17.2 mmol/L — ABNORMAL LOW (ref 20.0–28.0)
Calcium, Ion: 0.9 mmol/L — ABNORMAL LOW (ref 1.15–1.40)
Calcium, Ion: 1.03 mmol/L — ABNORMAL LOW (ref 1.15–1.40)
Calcium, Ion: 0.96 mmol/L — ABNORMAL LOW (ref 1.15–1.40)
HCT: 35 % — ABNORMAL LOW (ref 39.0–52.0)
HCT: 33 % — ABNORMAL LOW (ref 39.0–52.0)
HCT: 34 % — ABNORMAL LOW (ref 39.0–52.0)
Hemoglobin: 11.9 g/dL — ABNORMAL LOW (ref 13.0–17.0)
Hemoglobin: 11.2 g/dL — ABNORMAL LOW (ref 13.0–17.0)
Hemoglobin: 11.6 g/dL — ABNORMAL LOW (ref 13.0–17.0)
O2 Saturation: 98 %
O2 Saturation: 84 %
O2 Saturation: 89 %
PO2 VEN: 105 mmHg — AB (ref 32.0–45.0)
Potassium: 7.9 mmol/L (ref 3.5–5.1)
Potassium: 6.1 mmol/L — ABNORMAL HIGH (ref 3.5–5.1)
Potassium: 5.9 mmol/L — ABNORMAL HIGH (ref 3.5–5.1)
Sodium: 130 mmol/L — ABNORMAL LOW (ref 135–145)
Sodium: 129 mmol/L — ABNORMAL LOW (ref 135–145)
Sodium: 131 mmol/L — ABNORMAL LOW (ref 135–145)
TCO2: 15 mmol/L — ABNORMAL LOW (ref 22–32)
TCO2: 16 mmol/L — ABNORMAL LOW (ref 22–32)
TCO2: 18 mmol/L — ABNORMAL LOW (ref 22–32)
pCO2, Ven: 25.8 mmHg — ABNORMAL LOW (ref 44.0–60.0)
pCO2, Ven: 26.9 mmHg — ABNORMAL LOW (ref 44.0–60.0)
pCO2, Ven: 27.9 mmHg — ABNORMAL LOW (ref 44.0–60.0)
pH, Ven: 7.351 (ref 7.250–7.430)
pH, Ven: 7.368 (ref 7.250–7.430)
pH, Ven: 7.398 (ref 7.250–7.430)
pO2, Ven: 49 mmHg — ABNORMAL HIGH (ref 32.0–45.0)
pO2, Ven: 56 mmHg — ABNORMAL HIGH (ref 32.0–45.0)

## 2019-01-24 LAB — COMPREHENSIVE METABOLIC PANEL
ALT: 16 U/L (ref 0–44)
AST: 27 U/L (ref 15–41)
Albumin: 3.8 g/dL (ref 3.5–5.0)
Alkaline Phosphatase: 79 U/L (ref 38–126)
Anion gap: 27 — ABNORMAL HIGH (ref 5–15)
BUN: 99 mg/dL — ABNORMAL HIGH (ref 6–20)
CO2: 12 mmol/L — ABNORMAL LOW (ref 22–32)
Calcium: 8.3 mg/dL — ABNORMAL LOW (ref 8.9–10.3)
Chloride: 94 mmol/L — ABNORMAL LOW (ref 98–111)
Creatinine, Ser: 18.63 mg/dL — ABNORMAL HIGH (ref 0.61–1.24)
GFR, EST AFRICAN AMERICAN: 3 mL/min — AB (ref >60)
GFR, EST NON AFRICAN AMERICAN: 3 mL/min — AB (ref >60)
Glucose, Bld: 62 mg/dL — ABNORMAL LOW (ref 70–99)
Potassium: >7.5 mmol/L (ref 3.5–5.1)
Sodium: 133 mmol/L — ABNORMAL LOW (ref 135–145)
Total Bilirubin: 1.2 mg/dL (ref 0.3–1.2)
Total Protein: 7 g/dL (ref 6.5–8.1)

## 2019-01-24 LAB — CBC WITH DIFFERENTIAL/PLATELET
Abs Immature Granulocytes: 0.02 10*3/uL (ref 0.00–0.07)
Basophils Absolute: 0 10*3/uL (ref 0.0–0.1)
Basophils Relative: 0 %
Eosinophils Absolute: 0 10*3/uL (ref 0.0–0.5)
Eosinophils Relative: 0 %
HCT: 36.1 % — ABNORMAL LOW (ref 39.0–52.0)
Hemoglobin: 11.8 g/dL — ABNORMAL LOW (ref 13.0–17.0)
Immature Granulocytes: 1 %
Lymphocytes Relative: 25 %
Lymphs Abs: 1.1 10*3/uL (ref 0.7–4.0)
MCH: 36.3 pg — ABNORMAL HIGH (ref 26.0–34.0)
MCHC: 32.7 g/dL (ref 30.0–36.0)
MCV: 111.1 fL — AB (ref 80.0–100.0)
MONO ABS: 0.5 10*3/uL (ref 0.1–1.0)
Monocytes Relative: 10 %
Neutro Abs: 2.9 10*3/uL (ref 1.7–7.7)
Neutrophils Relative %: 64 %
Platelets: 111 10*3/uL — ABNORMAL LOW (ref 150–400)
RBC: 3.25 MIL/uL — ABNORMAL LOW (ref 4.22–5.81)
RDW: 17.8 % — ABNORMAL HIGH (ref 11.5–15.5)
WBC: 4.4 10*3/uL (ref 4.0–10.5)
nRBC: 0 % (ref 0.0–0.2)

## 2019-01-24 LAB — CBG MONITORING, ED
Glucose-Capillary: 56 mg/dL — ABNORMAL LOW (ref 70–99)
Glucose-Capillary: 142 mg/dL — ABNORMAL HIGH (ref 70–99)
Glucose-Capillary: 120 mg/dL — ABNORMAL HIGH (ref 70–99)
Glucose-Capillary: 52 mg/dL — ABNORMAL LOW (ref 70–99)

## 2019-01-24 LAB — BRAIN NATRIURETIC PEPTIDE: B Natriuretic Peptide: >4500 pg/mL — ABNORMAL HIGH (ref 0.0–100.0)

## 2019-01-24 LAB — LIPASE, BLOOD: Lipase: 28 U/L (ref 11–51)

## 2019-01-24 MED ORDER — DEXTROSE 10 % IV BOLUS
250.0000 mL | Freq: Once | INTRAVENOUS | Status: AC
  Administered 2019-01-24: 250 mL via INTRAVENOUS

## 2019-01-24 MED ORDER — EMTRICITABINE-TENOFOVIR AF 200-25 MG PO TABS
1.0000 | ORAL_TABLET | Freq: Every day | ORAL | Status: DC
  Administered 2019-01-25 – 2019-01-29 (×5): 1 via ORAL
  Filled 2019-01-24 (×6): qty 1

## 2019-01-24 MED ORDER — HEPARIN SODIUM (PORCINE) 1000 UNIT/ML DIALYSIS
5000.0000 [IU] | Freq: Once | INTRAMUSCULAR | Status: AC
  Administered 2019-01-25: 5000 [IU] via INTRAVENOUS_CENTRAL
  Filled 2019-01-24: qty 5

## 2019-01-24 MED ORDER — ACETAMINOPHEN 650 MG RE SUPP: 650.0000 mg | Freq: Four times a day (QID) | RECTAL | Status: DC | PRN

## 2019-01-24 MED ORDER — DOLUTEGRAVIR SODIUM 50 MG PO TABS
50.0000 mg | ORAL_TABLET | Freq: Every day | ORAL | Status: DC
  Administered 2019-01-25 – 2019-01-29 (×5): 50 mg via ORAL
  Filled 2019-01-24 (×5): qty 1

## 2019-01-24 MED ORDER — HEPARIN SODIUM (PORCINE) 1000 UNIT/ML DIALYSIS
1000.0000 [IU] | INTRAMUSCULAR | Status: DC | PRN
  Administered 2019-01-27 (×2): 1000 [IU] via INTRAVENOUS_CENTRAL
  Filled 2019-01-24: qty 1

## 2019-01-24 MED ORDER — PENTAFLUOROPROP-TETRAFLUOROETH EX AERO
1.0000 "application " | INHALATION_SPRAY | CUTANEOUS | Status: DC | PRN
  Filled 2019-01-24: qty 116

## 2019-01-24 MED ORDER — ACETAMINOPHEN 325 MG PO TABS
650.0000 mg | ORAL_TABLET | Freq: Four times a day (QID) | ORAL | Status: DC | PRN
  Administered 2019-01-26 – 2019-01-29 (×5): 650 mg via ORAL
  Filled 2019-01-24 (×5): qty 2

## 2019-01-24 MED ORDER — SODIUM ZIRCONIUM CYCLOSILICATE 10 G PO PACK
10.0000 g | PACK | Freq: Three times a day (TID) | ORAL | Status: DC
  Administered 2019-01-24 – 2019-01-25 (×3): 10 g via ORAL
  Filled 2019-01-24 (×3): qty 1

## 2019-01-24 MED ORDER — HEPARIN SODIUM (PORCINE) 5000 UNIT/ML IJ SOLN
5000.0000 [IU] | Freq: Three times a day (TID) | INTRAMUSCULAR | Status: DC
  Administered 2019-01-25 – 2019-01-29 (×10): 5000 [IU] via SUBCUTANEOUS
  Filled 2019-01-24 (×13): qty 1

## 2019-01-24 MED ORDER — SODIUM CHLORIDE 0.9 % IV SOLN: 100.0000 mL | INTRAVENOUS | Status: DC | PRN

## 2019-01-24 MED ORDER — DRONABINOL 2.5 MG PO CAPS
5.0000 mg | ORAL_CAPSULE | Freq: Every day | ORAL | Status: DC
  Administered 2019-01-25 – 2019-01-29 (×5): 5 mg via ORAL
  Filled 2019-01-24 (×5): qty 2

## 2019-01-24 MED ORDER — OXYCODONE-ACETAMINOPHEN 5-325 MG PO TABS
1.0000 | ORAL_TABLET | Freq: Once | ORAL | Status: AC
  Administered 2019-01-24: 1 via ORAL
  Filled 2019-01-24: qty 1

## 2019-01-24 MED ORDER — INSULIN ASPART 100 UNIT/ML ~~LOC~~ SOLN
5.0000 [IU] | Freq: Once | SUBCUTANEOUS | Status: AC
  Administered 2019-01-24: 5 [IU] via INTRAVENOUS

## 2019-01-24 MED ORDER — OXYCODONE-ACETAMINOPHEN 10-325 MG PO TABS: 1.0000 | ORAL_TABLET | Freq: Two times a day (BID) | ORAL | Status: DC | PRN

## 2019-01-24 MED ORDER — SODIUM CHLORIDE 0.9 % IV SOLN
100.0000 mL | INTRAVENOUS | Status: DC | PRN
  Administered 2019-01-28: 11:00:00 via INTRAVENOUS

## 2019-01-24 MED ORDER — LIDOCAINE-PRILOCAINE 2.5-2.5 % EX CREA
1.0000 "application " | TOPICAL_CREAM | CUTANEOUS | Status: DC | PRN
  Filled 2019-01-24: qty 5

## 2019-01-24 MED ORDER — ROPINIROLE HCL 0.25 MG PO TABS
0.2500 mg | ORAL_TABLET | Freq: Every day | ORAL | Status: DC
  Administered 2019-01-25 – 2019-01-28 (×4): 0.25 mg via ORAL
  Filled 2019-01-24 (×6): qty 1

## 2019-01-24 MED ORDER — NICOTINE 14 MG/24HR TD PT24
14.0000 mg | MEDICATED_PATCH | Freq: Once | TRANSDERMAL | Status: AC
  Administered 2019-01-24: 14 mg via TRANSDERMAL
  Filled 2019-01-24: qty 1

## 2019-01-24 MED ORDER — SODIUM CHLORIDE 0.9 % IV SOLN
1.0000 g | Freq: Once | INTRAVENOUS | Status: AC
  Administered 2019-01-24: 1 g via INTRAVENOUS
  Filled 2019-01-24: qty 10

## 2019-01-24 MED ORDER — LIDOCAINE HCL (PF) 1 % IJ SOLN
10.0000 mL | Freq: Once | INTRAMUSCULAR | Status: AC
  Administered 2019-01-24: 10 mL via INTRADERMAL

## 2019-01-24 MED ORDER — HYDROMORPHONE HCL 1 MG/ML IJ SOLN
1.0000 mg | Freq: Once | INTRAMUSCULAR | Status: AC
  Administered 2019-01-24: 1 mg via INTRAVENOUS
  Filled 2019-01-24: qty 1

## 2019-01-24 MED ORDER — CHLORHEXIDINE GLUCONATE CLOTH 2 % EX PADS
6.0000 | MEDICATED_PAD | Freq: Every day | CUTANEOUS | Status: DC
  Administered 2019-01-25: 6 via TOPICAL

## 2019-01-24 MED ORDER — ALBUTEROL SULFATE (2.5 MG/3ML) 0.083% IN NEBU
5.0000 mg | INHALATION_SOLUTION | Freq: Once | RESPIRATORY_TRACT | Status: AC
  Administered 2019-01-24: 5 mg via RESPIRATORY_TRACT
  Filled 2019-01-24: qty 6

## 2019-01-24 MED ORDER — LIDOCAINE HCL (PF) 1 % IJ SOLN
5.0000 mL | INTRAMUSCULAR | Status: DC | PRN
  Filled 2019-01-24: qty 5

## 2019-01-24 MED ORDER — DEXTROSE 50 % IV SOLN
25.0000 mL | Freq: Once | INTRAVENOUS | Status: AC
  Administered 2019-01-24: 25 mL via INTRAVENOUS
  Filled 2019-01-24: qty 50

## 2019-01-24 MED ORDER — OXYCODONE HCL 5 MG PO TABS
10.0000 mg | ORAL_TABLET | Freq: Two times a day (BID) | ORAL | Status: DC | PRN
  Administered 2019-01-25: 10 mg via ORAL
  Filled 2019-01-24: qty 2

## 2019-01-24 MED ORDER — SENNOSIDES-DOCUSATE SODIUM 8.6-50 MG PO TABS
1.0000 | ORAL_TABLET | Freq: Every evening | ORAL | Status: DC | PRN
  Administered 2019-01-26 – 2019-01-27 (×2): 1 via ORAL
  Filled 2019-01-24 (×2): qty 1

## 2019-01-24 MED ORDER — ALTEPLASE 2 MG IJ SOLR: 2.0000 mg | Freq: Once | INTRAMUSCULAR | Status: DC | PRN

## 2019-01-24 NOTE — ED Notes (Signed)
Nephrologist paged,and notified, of new vascular access placement. Nephrology to contact dialysis unit.
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
## 2019-01-24 NOTE — ED Provider Notes (Signed)
I saw and evaluated the patient, reviewed the resident's note and I agree with the findings and plan.  EKG:  43 year old male with history of end-stage renal disease who is missed dialysis for about a week who presents with increased weakness and shortness of breath.  His EKG is concerning for hyperkalemia.  Labs are pending but to receive calcium.  We will likely give standard is hyperkalemia treatment and admit pending results   Lacretia Leigh, MD 01/24/19 1645

## 2019-01-24 NOTE — ED Triage Notes (Signed)
Pt BIB [facility] for eval of L fistula being clotted off. Pt w/ known thrombus, left AMA on Friday to "think about the options" surgery vs "clot buster". Pt reports he "couldn't take it any more" and presented here today for same. Pt reports worsening muscle cramps that are causing "near paralysys", worsening SOB and generalized malaise. Last dialysis was last Monday.
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
## 2019-01-24 NOTE — ED Triage Notes (Signed)
Pt arrives to ED from home with complaints of vascular access problems since over a week ago. EMS reports pt was told x5 days ago that his fistula did not have a thrill and he could not be dialyzed, last treatment x7 days ago (Monday). Pt is still ambulatory but states he is shaky on his feet, A&Ox4 EMS vitals: 140/90 HR 100, 98% on room air. Pt placed in position of comfort with bed locked and lowered, call bell in reach.

## 2019-01-24 NOTE — Procedures (Signed)
Central Venous Catheter Insertion Procedure Note Brian [name] [id number] [birth year]  Procedure: Insertion of Central Venous Catheter Indications: emergent dialysis for hyperkalemia in ESRD with thrombosed AVF  Procedure Details Consent: Risks of procedure as well as the alternatives and risks of each were explained to the (patient/caregiver).  Consent for procedure obtained. Time Out: Verified patient identification, verified procedure, site/side was marked, verified correct patient position, special equipment/implants available, medications/allergies/relevent history reviewed, required imaging and test results available.  Performed  Maximum sterile technique was used including antiseptics, cap, gloves, gown, hand hygiene, mask and sheet. Skin prep: Chlorhexidine; local anesthetic administered A antimicrobial bonded/coated triple lumen catheter was placed in the right internal jugular vein using the Seldinger technique. US guidance used RIJ Trilaysis catheter placed with good blood flow and flushes eaisly  Evaluation Blood flow good Complications: No apparent complications Patient did tolerate procedure well. Chest X-ray ordered to verify placement.  CXR: pending.  Brian [name] 01/24/2019, 8:43 PM

## 2019-01-24 NOTE — ED Notes (Signed)
Patient requesting nicotine patch, will notify MD. Pt and family educated on smoking cessation.

## 2019-01-24 NOTE — ED Notes (Signed)
Percocet given at 1658. Sodium zirconium cyclosilicate may be given after 1858.
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
## 2019-01-25 DIAGNOSIS — B20 Human immunodeficiency virus [HIV] disease: Secondary | ICD-10-CM

## 2019-01-25 DIAGNOSIS — M549 Dorsalgia, unspecified: Secondary | ICD-10-CM

## 2019-01-25 DIAGNOSIS — Z9115 Patient's noncompliance with renal dialysis: Secondary | ICD-10-CM

## 2019-01-25 DIAGNOSIS — E872 Acidosis: Secondary | ICD-10-CM

## 2019-01-25 DIAGNOSIS — G2581 Restless legs syndrome: Secondary | ICD-10-CM

## 2019-01-25 DIAGNOSIS — N186 End stage renal disease: Secondary | ICD-10-CM

## 2019-01-25 DIAGNOSIS — Z79891 Long term (current) use of opiate analgesic: Secondary | ICD-10-CM

## 2019-01-25 DIAGNOSIS — I420 Dilated cardiomyopathy: Secondary | ICD-10-CM

## 2019-01-25 DIAGNOSIS — Z86718 Personal history of other venous thrombosis and embolism: Secondary | ICD-10-CM

## 2019-01-25 DIAGNOSIS — Z905 Acquired absence of kidney: Secondary | ICD-10-CM

## 2019-01-25 DIAGNOSIS — T82868A Thrombosis of vascular prosthetic devices, implants and grafts, initial encounter: Secondary | ICD-10-CM

## 2019-01-25 DIAGNOSIS — Z992 Dependence on renal dialysis: Secondary | ICD-10-CM

## 2019-01-25 DIAGNOSIS — D638 Anemia in other chronic diseases classified elsewhere: Secondary | ICD-10-CM

## 2019-01-25 DIAGNOSIS — G8921 Chronic pain due to trauma: Secondary | ICD-10-CM

## 2019-01-25 DIAGNOSIS — Q613 Polycystic kidney, unspecified: Secondary | ICD-10-CM

## 2019-01-25 DIAGNOSIS — Z86711 Personal history of pulmonary embolism: Secondary | ICD-10-CM

## 2019-01-25 DIAGNOSIS — Z91048 Other nonmedicinal substance allergy status: Secondary | ICD-10-CM

## 2019-01-25 DIAGNOSIS — F17210 Nicotine dependence, cigarettes, uncomplicated: Secondary | ICD-10-CM

## 2019-01-25 DIAGNOSIS — Z79899 Other long term (current) drug therapy: Secondary | ICD-10-CM

## 2019-01-25 LAB — RENAL FUNCTION PANEL
ANION GAP: 24 — AB (ref 5–15)
Albumin: 3.5 g/dL (ref 3.5–5.0)
BUN: 99 mg/dL — ABNORMAL HIGH (ref 6–20)
CO2: 17 mmol/L — ABNORMAL LOW (ref 22–32)
Calcium: 8.4 mg/dL — ABNORMAL LOW (ref 8.9–10.3)
Chloride: 90 mmol/L — ABNORMAL LOW (ref 98–111)
Creatinine, Ser: 18.72 mg/dL — ABNORMAL HIGH (ref 0.61–1.24)
GFR calc Af Amer: 3 mL/min — ABNORMAL LOW (ref >60)
GFR, EST NON AFRICAN AMERICAN: 3 mL/min — AB (ref >60)
Glucose, Bld: 114 mg/dL — ABNORMAL HIGH (ref 70–99)
Phosphorus: 9.3 mg/dL — ABNORMAL HIGH (ref 2.5–4.6)
Potassium: 6.1 mmol/L — ABNORMAL HIGH (ref 3.5–5.1)
Sodium: 131 mmol/L — ABNORMAL LOW (ref 135–145)

## 2019-01-25 LAB — CBC
HCT: 31.5 % — ABNORMAL LOW (ref 39.0–52.0)
Hemoglobin: 10.4 g/dL — ABNORMAL LOW (ref 13.0–17.0)
MCH: 36 pg — ABNORMAL HIGH (ref 26.0–34.0)
MCHC: 33 g/dL (ref 30.0–36.0)
MCV: 109 fL — AB (ref 80.0–100.0)
Platelets: 99 10*3/uL — ABNORMAL LOW (ref 150–400)
RBC: 2.89 MIL/uL — ABNORMAL LOW (ref 4.22–5.81)
RDW: 17.9 % — ABNORMAL HIGH (ref 11.5–15.5)
WBC: 4.9 10*3/uL (ref 4.0–10.5)
nRBC: 0 % (ref 0.0–0.2)

## 2019-01-25 LAB — BASIC METABOLIC PANEL
Anion gap: 19 — ABNORMAL HIGH (ref 5–15)
BUN: 62 mg/dL — ABNORMAL HIGH (ref 6–20)
CO2: 21 mmol/L — ABNORMAL LOW (ref 22–32)
Calcium: 8.5 mg/dL — ABNORMAL LOW (ref 8.9–10.3)
Chloride: 94 mmol/L — ABNORMAL LOW (ref 98–111)
Creatinine, Ser: 11.86 mg/dL — ABNORMAL HIGH (ref 0.61–1.24)
GFR calc Af Amer: 5 mL/min — ABNORMAL LOW (ref >60)
GFR calc non Af Amer: 5 mL/min — ABNORMAL LOW (ref >60)
Glucose, Bld: 100 mg/dL — ABNORMAL HIGH (ref 70–99)
Potassium: 4.2 mmol/L (ref 3.5–5.1)
Sodium: 134 mmol/L — ABNORMAL LOW (ref 135–145)

## 2019-01-25 LAB — CBG MONITORING, ED: Glucose-Capillary: 123 mg/dL — ABNORMAL HIGH (ref 70–99)

## 2019-01-25 LAB — MRSA PCR SCREENING: MRSA by PCR: NEGATIVE

## 2019-01-25 LAB — POTASSIUM: Potassium: 4.2 mmol/L (ref 3.5–5.1)

## 2019-01-25 MED ORDER — DIAZEPAM 5 MG PO TABS
5.0000 mg | ORAL_TABLET | Freq: Every day | ORAL | Status: DC
  Administered 2019-01-25 – 2019-01-28 (×4): 5 mg via ORAL
  Filled 2019-01-25 (×4): qty 1

## 2019-01-25 MED ORDER — HEPARIN SODIUM (PORCINE) 1000 UNIT/ML IJ SOLN
INTRAMUSCULAR | Status: AC
  Filled 2019-01-25: qty 3

## 2019-01-25 MED ORDER — OXYCODONE HCL 5 MG PO TABS
10.0000 mg | ORAL_TABLET | Freq: Four times a day (QID) | ORAL | Status: DC | PRN
  Administered 2019-01-25 – 2019-01-28 (×10): 10 mg via ORAL
  Filled 2019-01-25 (×12): qty 2

## 2019-01-25 MED ORDER — FERRIC CITRATE 1 GM 210 MG(FE) PO TABS
420.0000 mg | ORAL_TABLET | Freq: Three times a day (TID) | ORAL | Status: DC
  Administered 2019-01-25 – 2019-01-29 (×7): 420 mg via ORAL
  Filled 2019-01-25 (×14): qty 2

## 2019-01-25 MED ORDER — DIAZEPAM 5 MG PO TABS: 5.0000 mg | ORAL_TABLET | Freq: Every day | ORAL | Status: DC

## 2019-01-25 MED ORDER — FERRIC CITRATE 1 GM 210 MG(FE) PO TABS
210.0000 mg | ORAL_TABLET | ORAL | Status: DC
  Administered 2019-01-26 – 2019-01-28 (×2): 210 mg via ORAL
  Filled 2019-01-25 (×14): qty 1

## 2019-01-25 NOTE — Progress Notes (Signed)
Inpatient Diabetes Program Recommendations  AACE/ADA: New Consensus Statement on Inpatient Glycemic Control (2015)  Target Ranges:  Prepandial:   less than 140 mg/dL      Peak postprandial:   less than 180 mg/dL (1-2 hours)      Critically ill patients:  140 - 180 mg/dL   Lab Results  Component Value Date   GLUCAP 123 (H) 01/25/2019   HGBA1C 5.8 (H) 08/09/2016    Review of Glycemic Control Results for Brian [name], Brian [name] (MRN [id number]) as of 01/25/2019 11:09  Ref. Range 01/24/2019 16:32 01/24/2019 17:37 01/24/2019 18:42 01/24/2019 21:26 01/25/2019 01:18  Glucose-Capillary Latest Ref Range: 70 - 99 mg/dL 56 (L) 142 (H) 120 (H) 52 (L) 123 (H)   Diabetes history: No hx Outpatient Diabetes medications: none Current orders for Inpatient glycemic control: none  Inpatient Diabetes Program Recommendations:    Noted patient having episodes of hypoglycemic this admission following emergent HD. No orders in for CBGs. Would recommend placing orders for collection of CBGs TID & HS.   Thanks, Bronson Curb, MSN, RNC-OB Diabetes Coordinator [phone] (8a-5p)

## 2019-01-25 NOTE — Progress Notes (Signed)
HD tx initiated via HD cath w/o problem Bilat ports: pull/push/flush equally w/o problem VSS Will continue to monitor while on HD tx 

## 2019-01-25 NOTE — Progress Notes (Addendum)
Paged MD regarding pain meds; pt requesting PRN meds to be more frequent, current pain 6/10 and oxycodone unable to be given until 1840, refusing PRN tylenol. 2nd page sent [phone].

## 2019-01-25 NOTE — Progress Notes (Signed)
[facility]    Patient was seen and evaluated by Dr. Carlis Abbott on 01/22/2019.  The patient left AMA before new access was placed.  I have ordered a left UE vein mapping for access planning.    We will plan access this week.     Roxy Horseman 01/25/2019 4:52 PM --  Laboratory Lab Results: Recent Labs    01/24/19 1629  01/24/19 2145 01/25/19 0208  WBC 4.4  --   --  4.9  HGB 11.8*   < > 11.6* 10.4*  HCT 36.1*   < > 34.0* 31.5*  PLT 111*  --   --  99*   < > = values in this interval not displayed.   BMET Recent Labs    01/25/19 0208 01/25/19 0300  NA 131* 134*  K 6.1* 4.2  4.2  CL 90* 94*  CO2 17* 21*  GLUCOSE 114* 100*  BUN 99* 62*  CREATININE 18.72* 11.86*  CALCIUM 8.4* 8.5*    COAG Lab Results  Component Value Date   INR 1.14 01/22/2019   INR 1.12 12/17/2018   INR 1.04 04/02/2018   No results found for: PTT
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
## 2019-01-25 NOTE — Progress Notes (Addendum)
Paged MD as pt asking to be placed on PTA medications. Awaiting return page; discussed this AM during rounds.

## 2019-01-25 NOTE — ED Notes (Signed)
Bed Control contacted regarding pt's movement to HD.

## 2019-01-25 NOTE — Progress Notes (Signed)
HD tx ended 9 min early  @ 0526 d/t clotting dialyzer UF goal still met and exceeded d/t pt challenging goal by 1L Blood rinsed back VSS Report called to Nira Conn, RN

## 2019-01-25 NOTE — Progress Notes (Addendum)
Subjective:  Tolerated HD late last night , no cos this am   Objective Vital signs in last 24 hours: Vitals:   01/25/19 0533 01/25/19 0558 01/25/19 0750 01/25/19 1214  BP: 120/81 (!) 125/93 (!) 142/90 (!) 126/94  Pulse: 90 93 (!) 109 (!) 104  Resp: 18 19 18 20   Temp: 97.9 F (36.6 C) 97.8 F (36.6 C) 98.5 F (36.9 C) 98 F (36.7 C)  TempSrc: Oral Oral Oral   SpO2: 95% 97% 98% 95%  Weight: 79.5 kg     Height:       Weight change:   Physical Exam: General: alert , chronically ill appearing  Male , NAD  Heart: RRR, no rug, mur, gallop Lungs: CTA unlabored breathing  Abdomen: BS pos , soft , NT, ND Extremities: no pedal edema  Dialysis Access: L Brachiocephalic AVF  No Thrill /Bruit    OP HD= [facility] MWF 4 hrs F200 dialyzer BFR 500 DFR 800 EDW 82 kg  2K/ 2Ca bath UF Profile 1 Calcitriol 0.25 mcg q rx  Problem/Plan: 1 Hyperkalemia/ cramps/ palpitations: missed 2 HD sessions, last HD Monday 2/03. Urgent dialysis last pm into this am. Has nontunneled HD cath placed by CCM last night . K 4.2 this am  2.  Thrombosed AVF: per notes, will need creation of new AVF- VVS  Called this am for Consult 2 ESRD: MWF [facility] follow up labs  3 Hypertension:  improved with UF 4.5 l uf / wt to 79.5 below edw  ,no op bp  med's  4. Anemia of ESRD:  Doesn't require ESA as OP hgb 11.6  5. Metabolic Bone Disease:  Calcitriol 0.25 mcg q rx, binder is Auryxia 2 TID AC per notes; however renvela 4 TID AC also noted on OP med list. Phos 9.3  / fu labs  6.  HIV: on ART  [name], PA-C [place] [phone] 01/25/2019,12:23 PM  LOS: 1 day   Pt seen, examined and agree w A/P as above.  [facility] 01/25/2019, 1:32 PM    Labs: Basic Metabolic Panel: Recent Labs  Lab 01/24/19 1629  01/24/19 2145 01/25/19 0208 01/25/19 0300  NA 133*   < > 131* 131* 134*  K >7.5*   < > 5.9* 6.1* 4.2  4.2  CL 94*  --   --  90* 94*  CO2 12*  --    --  17* 21*  GLUCOSE 62*  --   --  114* 100*  BUN 99*  --   --  99* 62*  CREATININE 18.63*  --   --  18.72* 11.86*  CALCIUM 8.3*  --   --  8.4* 8.5*  PHOS  --   --   --  9.3*  --    < > = values in this interval not displayed.   Liver Function Tests: Recent Labs  Lab 01/24/19 1629 01/25/19 0208  AST 27  --   ALT 16  --   ALKPHOS 79  --   BILITOT 1.2  --   PROT 7.0  --   ALBUMIN 3.8 3.5   Recent Labs  Lab 01/24/19 1629  LIPASE 28   No results for input(s): AMMONIA in the last 168 hours. CBC: Recent Labs  Lab 01/22/19 1839 01/24/19 1629  01/24/19 1927 01/24/19 2145 01/25/19 0208  WBC 3.0* 4.4  --   --   --  4.9  NEUTROABS 1.4* 2.9  --   --   --   --  HGB 10.7* 11.8*   < > 11.2* 11.6* 10.4*  HCT 32.7* 36.1*   < > 33.0* 34.0* 31.5*  MCV 108.6* 111.1*  --   --   --  109.0*  PLT 90* 111*  --   --   --  99*   < > = values in this interval not displayed.   Cardiac Enzymes: No results for input(s): CKTOTAL, CKMB, CKMBINDEX, TROPONINI in the last 168 hours. CBG: Recent Labs  Lab 01/24/19 1632 01/24/19 1737 01/24/19 1842 01/24/19 2126 01/25/19 0118  GLUCAP 56* 142* 120* 52* 123*    Medications: . sodium chloride    . sodium chloride     . Chlorhexidine Gluconate Cloth  6 each Topical Q0600  . diazepam  5 mg Oral QHS  . dolutegravir  50 mg Oral Daily  . dronabinol  5 mg Oral QAC lunch  . emtricitabine-tenofovir AF  1 tablet Oral Daily  . ferric citrate  210 mg Oral With snacks  . ferric citrate  420 mg Oral TID WC  . heparin      . heparin  5,000 Units Subcutaneous Q8H  . nicotine  14 mg Transdermal Once  . rOPINIRole  0.25 mg Oral QHS

## 2019-01-26 ENCOUNTER — Inpatient Hospital Stay (HOSPITAL_COMMUNITY): Payer: Medicare Other

## 2019-01-26 ENCOUNTER — Other Ambulatory Visit: Payer: Self-pay

## 2019-01-26 DIAGNOSIS — I428 Other cardiomyopathies: Secondary | ICD-10-CM

## 2019-01-26 DIAGNOSIS — Z01810 Encounter for preprocedural cardiovascular examination: Secondary | ICD-10-CM

## 2019-01-26 LAB — RENAL FUNCTION PANEL
Albumin: 3.3 g/dL — ABNORMAL LOW (ref 3.5–5.0)
Anion gap: 14 (ref 5–15)
BUN: 45 mg/dL — ABNORMAL HIGH (ref 6–20)
CO2: 23 mmol/L (ref 22–32)
Calcium: 8.8 mg/dL — ABNORMAL LOW (ref 8.9–10.3)
Chloride: 97 mmol/L — ABNORMAL LOW (ref 98–111)
Creatinine, Ser: 11.65 mg/dL — ABNORMAL HIGH (ref 0.61–1.24)
GFR calc Af Amer: 5 mL/min — ABNORMAL LOW (ref >60)
GFR calc non Af Amer: 5 mL/min — ABNORMAL LOW (ref >60)
Glucose, Bld: 99 mg/dL (ref 70–99)
Phosphorus: 5.8 mg/dL — ABNORMAL HIGH (ref 2.5–4.6)
Potassium: 4.6 mmol/L (ref 3.5–5.1)
Sodium: 134 mmol/L — ABNORMAL LOW (ref 135–145)

## 2019-01-26 MED ORDER — CHLORHEXIDINE GLUCONATE CLOTH 2 % EX PADS
6.0000 | MEDICATED_PAD | Freq: Every day | CUTANEOUS | Status: DC
  Administered 2019-01-26 – 2019-01-29 (×3): 6 via TOPICAL

## 2019-01-26 MED ORDER — NICOTINE 21 MG/24HR TD PT24
21.0000 mg | MEDICATED_PATCH | Freq: Every day | TRANSDERMAL | Status: DC
  Administered 2019-01-26 – 2019-01-29 (×4): 21 mg via TRANSDERMAL
  Filled 2019-01-26 (×4): qty 1

## 2019-01-26 MED ORDER — CEFAZOLIN SODIUM-DEXTROSE 1-4 GM/50ML-% IV SOLN: 1.0000 g | INTRAVENOUS | Status: AC

## 2019-01-26 NOTE — Progress Notes (Signed)
Pt for left upper arm basilic fistula by Dr Donzetta Matters on 2/13 if medically stable.  Ruta Hinds, MD [facility] [place] Office: [phone] Pager: [phone]
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
## 2019-01-26 NOTE — Progress Notes (Signed)
Upper extremity vein mapping has been completed.   Preliminary results in CV Proc.   Abram Sander 01/26/2019 2:43 PM

## 2019-01-27 LAB — RENAL FUNCTION PANEL
Albumin: 3.7 g/dL (ref 3.5–5.0)
Anion gap: 20 — ABNORMAL HIGH (ref 5–15)
BUN: 55 mg/dL — ABNORMAL HIGH (ref 6–20)
CO2: 22 mmol/L (ref 22–32)
Calcium: 9.1 mg/dL (ref 8.9–10.3)
Chloride: 90 mmol/L — ABNORMAL LOW (ref 98–111)
Creatinine, Ser: 14.34 mg/dL — ABNORMAL HIGH (ref 0.61–1.24)
GFR calc Af Amer: 4 mL/min — ABNORMAL LOW (ref >60)
GFR calc non Af Amer: 4 mL/min — ABNORMAL LOW (ref >60)
Glucose, Bld: 101 mg/dL — ABNORMAL HIGH (ref 70–99)
PHOSPHORUS: 6.1 mg/dL — AB (ref 2.5–4.6)
Potassium: 5.2 mmol/L — ABNORMAL HIGH (ref 3.5–5.1)
Sodium: 132 mmol/L — ABNORMAL LOW (ref 135–145)

## 2019-01-27 LAB — CBC
HCT: 33.2 % — ABNORMAL LOW (ref 39.0–52.0)
Hemoglobin: 10.6 g/dL — ABNORMAL LOW (ref 13.0–17.0)
MCH: 35.2 pg — ABNORMAL HIGH (ref 26.0–34.0)
MCHC: 31.9 g/dL (ref 30.0–36.0)
MCV: 110.3 fL — ABNORMAL HIGH (ref 80.0–100.0)
Platelets: 83 10*3/uL — ABNORMAL LOW (ref 150–400)
RBC: 3.01 MIL/uL — ABNORMAL LOW (ref 4.22–5.81)
RDW: 17.2 % — AB (ref 11.5–15.5)
WBC: 2.5 10*3/uL — ABNORMAL LOW (ref 4.0–10.5)
nRBC: 0 % (ref 0.0–0.2)

## 2019-01-27 MED ORDER — HEPARIN SODIUM (PORCINE) 1000 UNIT/ML IJ SOLN
INTRAMUSCULAR | Status: AC
  Administered 2019-01-27: 1000 [IU] via INTRAVENOUS_CENTRAL
  Filled 2019-01-27: qty 1

## 2019-01-27 MED ORDER — HEPARIN SODIUM (PORCINE) 1000 UNIT/ML IJ SOLN
INTRAMUSCULAR | Status: AC
  Administered 2019-01-27: 1000 [IU] via INTRAVENOUS_CENTRAL
  Filled 2019-01-27: qty 2

## 2019-01-27 MED ORDER — BISACODYL 5 MG PO TBEC
5.0000 mg | DELAYED_RELEASE_TABLET | Freq: Every day | ORAL | Status: DC | PRN
  Administered 2019-01-27: 5 mg via ORAL
  Filled 2019-01-27: qty 1

## 2019-01-27 MED ORDER — POLYETHYLENE GLYCOL 3350 17 G PO PACK
17.0000 g | PACK | Freq: Every day | ORAL | Status: DC
  Administered 2019-01-27 – 2019-01-29 (×3): 17 g via ORAL
  Filled 2019-01-27 (×3): qty 1

## 2019-01-27 NOTE — Progress Notes (Signed)
[facility]  Subjective  - feels ok ready to proceed with fistula   Objective (!) 123/95 (!) 103 (!) 97.5 F (36.4 C) (Oral) 16 99%  Intake/Output Summary (Last 24 hours) at 01/27/2019 0900 Last data filed at 01/26/2019 1700 Gross per 24 hour  Intake 600 ml  Output -  Net 600 ml   Left arm palpable upper arm basilic  Assessment/Planning: Pt for upper arm basilic fistula Dr Donzetta Matters tomorrow with tunneled catheter NPO p midnight consent Pt prefers general anesthesia  Procedure details discussed Dr Donzetta Matters will decide one or two stage procedure.  Ruta Hinds 01/27/2019 9:00 AM --  Laboratory Lab Results: Recent Labs    01/24/19 1629  01/24/19 2145 01/25/19 0208  WBC 4.4  --   --  4.9  HGB 11.8*   < > 11.6* 10.4*  HCT 36.1*   < > 34.0* 31.5*  PLT 111*  --   --  99*   < > = values in this interval not displayed.   BMET Recent Labs    01/25/19 0300 01/26/19 0449  NA 134* 134*  K 4.2  4.2 4.6  CL 94* 97*  CO2 21* 23  GLUCOSE 100* 99  BUN 62* 45*  CREATININE 11.86* 11.65*  CALCIUM 8.5* 8.8*    COAG Lab Results  Component Value Date   INR 1.14 01/22/2019   INR 1.12 12/17/2018   INR 1.04 04/02/2018   No results found for: PTT

## 2019-01-27 NOTE — Progress Notes (Addendum)
Subjective:  On hd no cos , noted plans tomor VVS perm cath and new AVF    Objective Vital signs in last 24 hours: Vitals:   01/26/19 2011 01/26/19 2325 01/27/19 0821 01/27/19 1142  BP: (!) 120/93 105/78 (!) 123/95 118/89  Pulse: (!) 103 (!) 103 (!) 103 (!) 102  Resp:   16 17  Temp: 98.1 F (36.7 C) 97.8 F (36.6 C) (!) 97.5 F (36.4 C) (!) 97.4 F (36.3 C)  TempSrc: Oral Oral Oral Oral  SpO2: 95% 97% 99% 98%  Weight:      Height:       Weight change:   Physical Exam: General: on Hd  alert , chronically ill appearing  Male , NAD  Heart: RRR, no rug, mur, gallop Lungs: CTA unlabored breathing  Abdomen: BS pos , soft , NT, ND Extremities: no pedal edema  Dialysis Access:  IJ Tem p hd cath, L Brachiocephalic AVF  No Bruit     OP HD=[facility] MWF 4 hrs F200 dialyzer BFR 500 DFR 800 EDW 82 kg  2K/ 2Ca bath UF Profile 1 Calcitriol 0.25 mcg q rx  Problem/Plan: 1Hyperkalemia/ cramps/ palpitations: missed 2 HD sessions, last HD Monday 2/03. Urgent dialysis last pm into this am. Has nontunneled HD cath placed by CCM last night. K 4.6  this am  now on hd on MWF schedule   2. Thrombosed AVF: per VVS  needs new upper arm  AVF  2 ESRD:MWF [facility] , now on schedule    3 Hypertension: on admit improved with UF 4.5 l uf / wt to 79.5 below edw  ,no op bp  med's / bp 87 on hd and ASYMP. !  4. 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 HIV: on ART  [name], PA-C [place] [phone] 01/27/2019,12:22 PM  LOS: 3 days   Pt seen, examined and agree w A/P as above.  [facility] 01/27/2019, 4:21 PM    Labs: Basic Metabolic Panel: Recent Labs  Lab 01/25/19 0208 01/25/19 0300 01/26/19 0449  NA 131* 134* 134*  K 6.1* 4.2  4.2 4.6  CL 90*  94* 97*  CO2 17* 21* 23  GLUCOSE 114* 100* 99  BUN 99* 62* 45*  CREATININE 18.72* 11.86* 11.65*  CALCIUM 8.4* 8.5* 8.8*  PHOS 9.3*  --  5.8*   Liver Function Tests: Recent Labs  Lab 01/24/19 1629 01/25/19 0208 01/26/19 0449  AST 27  --   --   ALT 16  --   --   ALKPHOS 79  --   --   BILITOT 1.2  --   --   PROT 7.0  --   --   ALBUMIN 3.8 3.5 3.3*   Recent Labs  Lab 01/24/19 1629  LIPASE 28   No results for input(s): AMMONIA in the last 168 hours. CBC: Recent Labs  Lab 01/22/19 1839 01/24/19 1629  01/24/19 1927 01/24/19 2145 01/25/19 0208  WBC 3.0* 4.4  --   --   --  4.9  NEUTROABS 1.4* 2.9  --   --   --   --   HGB 10.7* 11.8*   < > 11.2* 11.6* 10.4*  HCT 32.7* 36.1*   < >  33.0* 34.0* 31.5*  MCV 108.6* 111.1*  --   --   --  109.0*  PLT 90* 111*  --   --   --  99*   < > = values in this interval not displayed.   Cardiac Enzymes: No results for input(s): CKTOTAL, CKMB, CKMBINDEX, TROPONINI in the last 168 hours. CBG: Recent Labs  Lab 01/24/19 1632 01/24/19 1737 01/24/19 1842 01/24/19 2126 01/25/19 0118  GLUCAP 56* 142* 120* 52* 123*     Medications: . sodium chloride    . sodium chloride    .  ceFAZolin (ANCEF) IV     . Chlorhexidine Gluconate Cloth  6 each Topical Q0600  . diazepam  5 mg Oral QHS  . dolutegravir  50 mg Oral Daily  . dronabinol  5 mg Oral QAC lunch  . emtricitabine-tenofovir AF  1 tablet Oral Daily  . ferric citrate  210 mg Oral With snacks  . ferric citrate  420 mg Oral TID WC  . heparin  5,000 Units Subcutaneous Q8H  . nicotine  21 mg Transdermal Daily  . polyethylene glycol  17 g Oral Daily  . rOPINIRole  0.25 mg Oral QHS

## 2019-01-27 NOTE — Progress Notes (Signed)
Patient inquiring about time of hemodialysis. Nurse called hemodialysis and was told "patient does not have any orders". Nurse called Dr.Upton/Dr. Deterding @ [phone] and was told to call [facility] @ [phone], that was the wrong number.  Nurse given another number @ [phone], Nurse who answered could not help as it is the outside facility where patient receives dialysis. Nurse called back to Dr.Upton/Dr. Deterding's office and was told that Dr. Johnney Ou was on-call.  Nurse paged Dr. Johnney Ou @ [phone], waiting for call back.

## 2019-01-27 NOTE — Progress Notes (Signed)
Order put in for dialysis, patient off the unit.
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
## 2019-01-27 NOTE — Progress Notes (Signed)
Pharmacist Heart Failure Core Measure Documentation  Assessment: Brian [name] has an EF documented as 20-25% on 04/04/2018 by [place].  Rationale: Heart failure patients with left ventricular systolic dysfunction (LVSD) and an EF < 40% should be prescribed an angiotensin converting enzyme inhibitor (ACEI) or angiotensin receptor blocker (ARB) at discharge unless a contraindication is documented in the medical record.  This patient is not currently on an ACEI or ARB for HF.  This note is being placed in the record in order to provide documentation that a contraindication to the use of these agents is present for this encounter.  ACE Inhibitor or Angiotensin Receptor Blocker is contraindicated (specify all that apply)  []   ACEI allergy AND ARB allergy []   Angioedema []   Moderate or severe aortic stenosis []   Hyperkalemia []   Hypotension []   Renal artery stenosis [x]   Worsening renal function, preexisting renal disease or dysfunction   Romona Curls 01/27/2019 11:39 AM

## 2019-01-28 ENCOUNTER — Encounter (HOSPITAL_COMMUNITY)
Admission: EM | Disposition: A | Discharge status: Home / Self Care | Payer: Self-pay | Source: Home / Self Care | Attending: Oncology

## 2019-01-28 ENCOUNTER — Inpatient Hospital Stay (HOSPITAL_COMMUNITY): Payer: Medicare Other

## 2019-01-28 ENCOUNTER — Inpatient Hospital Stay (HOSPITAL_COMMUNITY): Payer: Medicare Other | Admitting: Certified Registered"

## 2019-01-28 ENCOUNTER — Encounter (HOSPITAL_COMMUNITY): Payer: Self-pay | Admitting: Certified Registered"

## 2019-01-28 DIAGNOSIS — I5020 Unspecified systolic (congestive) heart failure: Secondary | ICD-10-CM

## 2019-01-28 DIAGNOSIS — N184 Chronic kidney disease, stage 4 (severe): Secondary | ICD-10-CM

## 2019-01-28 HISTORY — PX: AV FISTULA PLACEMENT: SHX1204

## 2019-01-28 HISTORY — PX: INSERTION OF DIALYSIS CATHETER: SHX1324

## 2019-01-28 LAB — BASIC METABOLIC PANEL
Anion gap: 15 (ref 5–15)
BUN: 27 mg/dL — ABNORMAL HIGH (ref 6–20)
CALCIUM: 8.9 mg/dL (ref 8.9–10.3)
CO2: 27 mmol/L (ref 22–32)
Chloride: 94 mmol/L — ABNORMAL LOW (ref 98–111)
Creatinine, Ser: 8.34 mg/dL — ABNORMAL HIGH (ref 0.61–1.24)
GFR calc Af Amer: 8 mL/min — ABNORMAL LOW (ref >60)
GFR calc non Af Amer: 7 mL/min — ABNORMAL LOW (ref >60)
Glucose, Bld: 95 mg/dL (ref 70–99)
Potassium: 4.6 mmol/L (ref 3.5–5.1)
Sodium: 136 mmol/L (ref 135–145)

## 2019-01-28 LAB — PROTIME-INR
INR: 1.03
Prothrombin Time: 13.4 seconds (ref 11.4–15.2)

## 2019-01-28 LAB — CBC
HCT: 37.2 % — ABNORMAL LOW (ref 39.0–52.0)
Hemoglobin: 11.9 g/dL — ABNORMAL LOW (ref 13.0–17.0)
MCH: 35 pg — ABNORMAL HIGH (ref 26.0–34.0)
MCHC: 32 g/dL (ref 30.0–36.0)
MCV: 109.4 fL — AB (ref 80.0–100.0)
Platelets: 88 10*3/uL — ABNORMAL LOW (ref 150–400)
RBC: 3.4 MIL/uL — ABNORMAL LOW (ref 4.22–5.81)
RDW: 17.4 % — ABNORMAL HIGH (ref 11.5–15.5)
WBC: 2.9 10*3/uL — ABNORMAL LOW (ref 4.0–10.5)
nRBC: 0 % (ref 0.0–0.2)

## 2019-01-28 SURGERY — ARTERIOVENOUS (AV) FISTULA CREATION
Anesthesia: General | Site: Chest | Laterality: Right

## 2019-01-28 MED ORDER — PROPOFOL 10 MG/ML IV BOLUS
INTRAVENOUS | Status: DC | PRN
  Administered 2019-01-28: 200 mg via INTRAVENOUS

## 2019-01-28 MED ORDER — LIDOCAINE 2% (20 MG/ML) 5 ML SYRINGE
INTRAMUSCULAR | Status: DC | PRN
  Administered 2019-01-28: 100 mg via INTRAVENOUS

## 2019-01-28 MED ORDER — CHLORHEXIDINE GLUCONATE CLOTH 2 % EX PADS
6.0000 | MEDICATED_PAD | Freq: Every day | CUTANEOUS | Status: DC
  Administered 2019-01-29: 6 via TOPICAL

## 2019-01-28 MED ORDER — OXYCODONE HCL 5 MG PO TABS
10.0000 mg | ORAL_TABLET | ORAL | Status: DC | PRN
  Administered 2019-01-28 – 2019-01-29 (×4): 10 mg via ORAL
  Filled 2019-01-28 (×3): qty 2

## 2019-01-28 MED ORDER — DIPHENHYDRAMINE HCL 50 MG/ML IJ SOLN
INTRAMUSCULAR | Status: DC | PRN
  Administered 2019-01-28: 12.5 mg via INTRAVENOUS

## 2019-01-28 MED ORDER — FENTANYL CITRATE (PF) 100 MCG/2ML IJ SOLN
INTRAMUSCULAR | Status: AC
  Administered 2019-01-28: 50 ug via INTRAVENOUS
  Filled 2019-01-28: qty 2

## 2019-01-28 MED ORDER — HYDROMORPHONE HCL 1 MG/ML IJ SOLN
0.5000 mg | INTRAMUSCULAR | Status: DC | PRN
  Administered 2019-01-28 – 2019-01-29 (×4): 0.5 mg via INTRAVENOUS
  Filled 2019-01-28 (×4): qty 1

## 2019-01-28 MED ORDER — SODIUM CHLORIDE 0.9 % IV SOLN
INTRAVENOUS | Status: DC
  Administered 2019-01-28: 11:00:00 via INTRAVENOUS

## 2019-01-28 MED ORDER — 0.9 % SODIUM CHLORIDE (POUR BTL) OPTIME
TOPICAL | Status: DC | PRN
  Administered 2019-01-28: 1000 mL

## 2019-01-28 MED ORDER — PHENYLEPHRINE 40 MCG/ML (10ML) SYRINGE FOR IV PUSH (FOR BLOOD PRESSURE SUPPORT)
PREFILLED_SYRINGE | INTRAVENOUS | Status: DC | PRN
  Administered 2019-01-28 (×3): 80 ug via INTRAVENOUS
  Administered 2019-01-28: 120 ug via INTRAVENOUS
  Administered 2019-01-28: 80 ug via INTRAVENOUS
  Administered 2019-01-28: 120 ug via INTRAVENOUS
  Administered 2019-01-28: 80 ug via INTRAVENOUS

## 2019-01-28 MED ORDER — LIDOCAINE 2% (20 MG/ML) 5 ML SYRINGE
INTRAMUSCULAR | Status: AC
  Filled 2019-01-28: qty 5

## 2019-01-28 MED ORDER — PHENYLEPHRINE 40 MCG/ML (10ML) SYRINGE FOR IV PUSH (FOR BLOOD PRESSURE SUPPORT)
PREFILLED_SYRINGE | INTRAVENOUS | Status: AC
  Filled 2019-01-28: qty 10

## 2019-01-28 MED ORDER — OXYCODONE HCL 5 MG PO TABS: 5.0000 mg | ORAL_TABLET | Freq: Once | ORAL | Status: DC | PRN

## 2019-01-28 MED ORDER — EPHEDRINE SULFATE-NACL 50-0.9 MG/10ML-% IV SOSY
PREFILLED_SYRINGE | INTRAVENOUS | Status: DC | PRN
  Administered 2019-01-28: 10 mg via INTRAVENOUS

## 2019-01-28 MED ORDER — GLYCOPYRROLATE PF 0.2 MG/ML IJ SOSY
PREFILLED_SYRINGE | INTRAMUSCULAR | Status: AC
  Filled 2019-01-28: qty 1

## 2019-01-28 MED ORDER — CEFAZOLIN SODIUM 1 G IJ SOLR
INTRAMUSCULAR | Status: AC
  Filled 2019-01-28: qty 20

## 2019-01-28 MED ORDER — SODIUM CHLORIDE 0.9 % IV SOLN
INTRAVENOUS | Status: DC | PRN
  Administered 2019-01-28: 500 mL

## 2019-01-28 MED ORDER — OXYCODONE HCL 5 MG/5ML PO SOLN: 5.0000 mg | Freq: Once | ORAL | Status: DC | PRN

## 2019-01-28 MED ORDER — SODIUM CHLORIDE 0.9 % IV SOLN
INTRAVENOUS | Status: AC
  Filled 2019-01-28: qty 1.2

## 2019-01-28 MED ORDER — DEXAMETHASONE SODIUM PHOSPHATE 4 MG/ML IJ SOLN
INTRAMUSCULAR | Status: DC | PRN
  Administered 2019-01-28: 6 mg via INTRAVENOUS

## 2019-01-28 MED ORDER — ONDANSETRON HCL 4 MG/2ML IJ SOLN
INTRAMUSCULAR | Status: AC
  Filled 2019-01-28: qty 2

## 2019-01-28 MED ORDER — CEFAZOLIN SODIUM-DEXTROSE 2-3 GM-%(50ML) IV SOLR
INTRAVENOUS | Status: DC | PRN
  Administered 2019-01-28: 2 g via INTRAVENOUS

## 2019-01-28 MED ORDER — HEPARIN SODIUM (PORCINE) 1000 UNIT/ML IJ SOLN
INTRAMUSCULAR | Status: DC | PRN
  Administered 2019-01-28: 3000 [IU] via INTRAVENOUS

## 2019-01-28 MED ORDER — HEPARIN SODIUM (PORCINE) 1000 UNIT/ML IJ SOLN
INTRAMUSCULAR | Status: AC
  Filled 2019-01-28: qty 1

## 2019-01-28 MED ORDER — PROPOFOL 10 MG/ML IV BOLUS
INTRAVENOUS | Status: AC
  Filled 2019-01-28: qty 20

## 2019-01-28 MED ORDER — ACETAMINOPHEN 325 MG PO TABS: 325.0000 mg | ORAL_TABLET | ORAL | Status: DC | PRN

## 2019-01-28 MED ORDER — FENTANYL CITRATE (PF) 100 MCG/2ML IJ SOLN
INTRAMUSCULAR | Status: DC | PRN
  Administered 2019-01-28: 25 ug via INTRAVENOUS
  Administered 2019-01-28 (×2): 50 ug via INTRAVENOUS
  Administered 2019-01-28: 25 ug via INTRAVENOUS

## 2019-01-28 MED ORDER — FENTANYL CITRATE (PF) 100 MCG/2ML IJ SOLN
25.0000 ug | INTRAMUSCULAR | Status: DC | PRN
  Administered 2019-01-28 (×2): 50 ug via INTRAVENOUS

## 2019-01-28 MED ORDER — MEPERIDINE HCL 50 MG/ML IJ SOLN: 6.2500 mg | INTRAMUSCULAR | Status: DC | PRN

## 2019-01-28 MED ORDER — ONDANSETRON HCL 4 MG/2ML IJ SOLN
INTRAMUSCULAR | Status: DC | PRN
  Administered 2019-01-28: 4 mg via INTRAVENOUS

## 2019-01-28 MED ORDER — FENTANYL CITRATE (PF) 250 MCG/5ML IJ SOLN
INTRAMUSCULAR | Status: AC
  Filled 2019-01-28: qty 5

## 2019-01-28 MED ORDER — GLYCOPYRROLATE PF 0.2 MG/ML IJ SOSY
PREFILLED_SYRINGE | INTRAMUSCULAR | Status: DC | PRN
  Administered 2019-01-28: .1 mg via INTRAVENOUS

## 2019-01-28 MED ORDER — EPHEDRINE 5 MG/ML INJ
INTRAVENOUS | Status: AC
  Filled 2019-01-28: qty 10

## 2019-01-28 MED ORDER — ACETAMINOPHEN 160 MG/5ML PO SOLN: 325.0000 mg | ORAL | Status: DC | PRN

## 2019-01-28 MED ORDER — NALOXONE HCL 0.4 MG/ML IJ SOLN: 0.4000 mg | INTRAMUSCULAR | Status: DC | PRN

## 2019-01-28 MED ORDER — MIDAZOLAM HCL 5 MG/5ML IJ SOLN
INTRAMUSCULAR | Status: DC | PRN
  Administered 2019-01-28 (×2): 1 mg via INTRAVENOUS

## 2019-01-28 MED ORDER — SODIUM CHLORIDE 0.9 % IV SOLN
INTRAVENOUS | Status: DC | PRN
  Administered 2019-01-28: 60 ug/min via INTRAVENOUS

## 2019-01-28 MED ORDER — DIPHENHYDRAMINE HCL 50 MG/ML IJ SOLN
INTRAMUSCULAR | Status: AC
  Filled 2019-01-28: qty 1

## 2019-01-28 MED ORDER — DEXAMETHASONE SODIUM PHOSPHATE 10 MG/ML IJ SOLN
INTRAMUSCULAR | Status: AC
  Filled 2019-01-28: qty 1

## 2019-01-28 MED ORDER — MIDAZOLAM HCL 2 MG/2ML IJ SOLN
INTRAMUSCULAR | Status: AC
  Filled 2019-01-28: qty 2

## 2019-01-28 SURGICAL SUPPLY — 59 items
ADH SKN CLS APL DERMABOND .7 (GAUZE/BANDAGES/DRESSINGS) ×4
ARMBAND PINK RESTRICT EXTREMIT (MISCELLANEOUS) ×4 IMPLANT
BAG DECANTER FOR FLEXI CONT (MISCELLANEOUS) ×2 IMPLANT
BIOPATCH RED 1 DISK 7.0 (GAUZE/BANDAGES/DRESSINGS) ×3 IMPLANT
BIOPATCH RED 1IN DISK 7.0MM (GAUZE/BANDAGES/DRESSINGS) ×1
CANISTER SUCT 3000ML PPV (MISCELLANEOUS) ×4 IMPLANT
CATH PALINDROME REV 19CM (CATHETERS) ×2 IMPLANT
CATH PALINDROME RT-P 15FX19CM (CATHETERS) IMPLANT
CATH PALINDROME RT-P 15FX23CM (CATHETERS) IMPLANT
CATH PALINDROME RT-P 15FX28CM (CATHETERS) IMPLANT
CATH PALINDROME RT-P 15FX55CM (CATHETERS) IMPLANT
CLIP VESOCCLUDE MED 6/CT (CLIP) ×4 IMPLANT
CLIP VESOCCLUDE SM WIDE 6/CT (CLIP) ×4 IMPLANT
COVER PROBE W GEL 5X96 (DRAPES) ×4 IMPLANT
COVER SURGICAL LIGHT HANDLE (MISCELLANEOUS) ×4 IMPLANT
COVER WAND RF STERILE (DRAPES) ×2 IMPLANT
DERMABOND ADVANCED (GAUZE/BANDAGES/DRESSINGS) ×4
DERMABOND ADVANCED .7 DNX12 (GAUZE/BANDAGES/DRESSINGS) ×2 IMPLANT
DRAPE C-ARM 42X72 X-RAY (DRAPES) ×2 IMPLANT
DRAPE CHEST BREAST 15X10 FENES (DRAPES) ×2 IMPLANT
DRAPE ORTHO SPLIT 77X108 STRL (DRAPES) ×4
DRAPE SURG ORHT 6 SPLT 77X108 (DRAPES) IMPLANT
ELECT REM PT RETURN 9FT ADLT (ELECTROSURGICAL) ×4
ELECTRODE REM PT RTRN 9FT ADLT (ELECTROSURGICAL) ×2 IMPLANT
GAUZE 4X4 16PLY RFD (DISPOSABLE) ×2 IMPLANT
GLOVE BIO SURGEON STRL SZ7.5 (GLOVE) ×4 IMPLANT
GOWN STRL REUS W/ TWL LRG LVL3 (GOWN DISPOSABLE) ×4 IMPLANT
GOWN STRL REUS W/ TWL XL LVL3 (GOWN DISPOSABLE) ×2 IMPLANT
GOWN STRL REUS W/TWL LRG LVL3 (GOWN DISPOSABLE) ×8
GOWN STRL REUS W/TWL XL LVL3 (GOWN DISPOSABLE) ×4
INSERT FOGARTY SM (MISCELLANEOUS) IMPLANT
KIT BASIN OR (CUSTOM PROCEDURE TRAY) ×4 IMPLANT
KIT TURNOVER KIT B (KITS) ×4 IMPLANT
NDL 18GX1X1/2 (RX/OR ONLY) (NEEDLE) ×2 IMPLANT
NDL HYPO 25GX1X1/2 BEV (NEEDLE) ×2 IMPLANT
NEEDLE 18GX1X1/2 (RX/OR ONLY) (NEEDLE) IMPLANT
NEEDLE HYPO 25GX1X1/2 BEV (NEEDLE) IMPLANT
NS IRRIG 1000ML POUR BTL (IV SOLUTION) ×4 IMPLANT
PACK CV ACCESS (CUSTOM PROCEDURE TRAY) ×4 IMPLANT
PACK SURGICAL SETUP 50X90 (CUSTOM PROCEDURE TRAY) ×2 IMPLANT
PAD ARMBOARD 7.5X6 YLW CONV (MISCELLANEOUS) ×8 IMPLANT
SLEEVE SURGEON STRL (DRAPES) ×2 IMPLANT
SOAP 2 % CHG 4 OZ (WOUND CARE) ×4 IMPLANT
SUT ETHILON 3 0 PS 1 (SUTURE) ×4 IMPLANT
SUT MNCRL AB 4-0 PS2 18 (SUTURE) ×4 IMPLANT
SUT PROLENE 6 0 BV (SUTURE) ×6 IMPLANT
SUT SILK 2 0 SH (SUTURE) ×2 IMPLANT
SUT VIC AB 3-0 SH 27 (SUTURE) ×12
SUT VIC AB 3-0 SH 27X BRD (SUTURE) ×2 IMPLANT
SYR 10ML LL (SYRINGE) ×2 IMPLANT
SYR 20CC LL (SYRINGE) ×6 IMPLANT
SYR 5ML LL (SYRINGE) ×4 IMPLANT
SYR CONTROL 10ML LL (SYRINGE) ×2 IMPLANT
TOWEL GREEN STERILE (TOWEL DISPOSABLE) ×4 IMPLANT
TOWEL GREEN STERILE FF (TOWEL DISPOSABLE) ×4 IMPLANT
TOWEL OR 17X26 4PK STRL BLUE (TOWEL DISPOSABLE) ×4 IMPLANT
UNDERPAD 30X30 (UNDERPADS AND DIAPERS) ×4 IMPLANT
WATER STERILE IRR 1000ML POUR (IV SOLUTION) ×2 IMPLANT
WIRE BENTSON .035X145CM (WIRE) ×2 IMPLANT

## 2019-01-28 NOTE — Progress Notes (Signed)
Subjective:  Has new R IJ TDC and new LUA AVF sp single stage BVT of already matured upper arm basilic vein.   Objective Vital signs in last 24 hours: Vitals:   01/28/19 1310 01/28/19 1311 01/28/19 1333 01/28/19 1350  BP: 108/78   110/79  Pulse: (!) 107   (!) 117  Resp: (!) 25     Temp:  97.7 F (36.5 C) 97.7 F (36.5 C) 98.1 F (36.7 C)  TempSrc:    Oral  SpO2: 95%   97%  Weight:      Height:       Weight change: 3.1 kg  Physical Exam: General: on Hd  alert , chronically ill appearing  Male , NAD  Heart: RRR, no rug, mur, gallop Lungs: CTA unlabored breathing  Abdomen: BS pos , soft , NT, ND Extremities: no pedal edema  Dialysis Access:  IJ Tem p hd cath, L Brachiocephalic AVF  No Bruit     OP HD=[facility] MWF  4h  82kg  500/800  F200  2/2 bath  P1  Heparin 5000 Calcitriol 0.25 mcg q rx  Assessment/ Plan 1Hyperkalemia/ cramps/ palpitations: missed 2 HD sessions w/ clotted access. Had temp cath and HD , now has new R IJ TDC and new LUA 1-stage BVT AVF done today by Dr Donzetta Matters 01/28/19, will be ready sooner than usual as the vein has already matured a lot. Plan HD in am and prob ok for dc tomorrow after HD.    2 ESRD:MWF [facility] , back on schedule. HD tomorrow    3 Hypertension: BP's good, volume excellent, at dry wt, lungs clear  4. 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 HIV: on ART  [facility] 01/28/2019, 4:26 PM    Labs: Basic Metabolic Panel: Recent Labs  Lab 01/25/19 0208  01/26/19 0449 01/27/19 1152 01/28/19 0411  NA 131*   < > 134* 132* 136  K 6.1*   < > 4.6 5.2* 4.6  CL 90*   < > 97* 90* 94*  CO2 17*   < > 23 22 27   GLUCOSE 114*   < > 99 101* 95  BUN 99*   < > 45* 55* 27*  CREATININE 18.72*   < > 11.65* 14.34* 8.34*  CALCIUM 8.4*   < > 8.8*  9.1 8.9  PHOS 9.3*  --  5.8* 6.1*  --    < > = values in this interval not displayed.   Liver Function Tests: Recent Labs  Lab 01/24/19 1629 01/25/19 0208 01/26/19 0449 01/27/19 1152  AST 27  --   --   --   ALT 16  --   --   --   ALKPHOS 79  --   --   --   BILITOT 1.2  --   --   --   PROT 7.0  --   --   --   ALBUMIN 3.8 3.5 3.3* 3.7   Recent Labs  Lab 01/24/19 1629  LIPASE 28   No results for input(s): AMMONIA in the last 168 hours. CBC: Recent Labs  Lab 01/22/19 1839 01/24/19 1629  01/25/19 0208 01/27/19 1250 01/28/19 0411  WBC 3.0* 4.4  --  4.9  2.5* 2.9*  NEUTROABS 1.4* 2.9  --   --   --   --   HGB 10.7* 11.8*   < > 10.4* 10.6* 11.9*  HCT 32.7* 36.1*   < > 31.5* 33.2* 37.2*  MCV 108.6* 111.1*  --  109.0* 110.3* 109.4*  PLT 90* 111*  --  99* 83* 88*   < > = values in this interval not displayed.   Cardiac Enzymes: No results for input(s): CKTOTAL, CKMB, CKMBINDEX, TROPONINI in the last 168 hours. CBG: Recent Labs  Lab 01/24/19 1632 01/24/19 1737 01/24/19 1842 01/24/19 2126 01/25/19 0118  GLUCAP 56* 142* 120* 52* 123*     Medications: . sodium chloride    . sodium chloride    . sodium chloride 10 mL/hr at 01/28/19 1033   . Chlorhexidine Gluconate Cloth  6 each Topical Q0600  . diazepam  5 mg Oral QHS  . dolutegravir  50 mg Oral Daily  . dronabinol  5 mg Oral QAC lunch  . emtricitabine-tenofovir AF  1 tablet Oral Daily  . ferric citrate  210 mg Oral With snacks  . ferric citrate  420 mg Oral TID WC  . heparin  5,000 Units Subcutaneous Q8H  . nicotine  21 mg Transdermal Daily  . polyethylene glycol  17 g Oral Daily  . rOPINIRole  0.25 mg Oral QHS

## 2019-01-28 NOTE — Discharge Instructions (Signed)
° °  [facility] [place] ° °Discharge Instructions ° °AV Fistula or Graft Surgery for Dialysis Access ° °Please refer to the following instructions for your post-procedure care. Your surgeon or physician assistant will discuss any changes with you. ° °Activity ° °You may drive the day following your surgery, if you are comfortable and no longer taking prescription pain medication. Resume full activity as the soreness in your incision resolves. ° °Bathing/Showering ° °You may shower after you go home. Keep your incision dry for 48 hours. Do not soak in a bathtub, hot tub, or swim until the incision heals completely. You may not shower if you have a hemodialysis catheter. ° °Incision Care ° °Clean your incision with mild soap and water after 48 hours. Pat the area dry with a clean towel. You do not need a bandage unless otherwise instructed. Do not apply any ointments or creams to your incision. You may have skin glue on your incision. Do not peel it off. It will come off on its own in about one week. Your arm may swell a bit after surgery. To reduce swelling use pillows to elevate your arm so it is above your heart. Your doctor will tell you if you need to lightly wrap your arm with an ACE bandage. ° °Diet ° °Resume your normal diet. There are not special food restrictions following this procedure. In order to heal from your surgery, it is CRITICAL to get adequate nutrition. Your body requires vitamins, minerals, and protein. Vegetables are the best source of vitamins and minerals. Vegetables also provide the perfect balance of protein. Processed food has little nutritional value, so try to avoid this. ° °Medications ° °Resume taking all of your medications. If your incision is causing pain, you may take over-the counter pain relievers such as acetaminophen (Tylenol). If you were prescribed a stronger pain medication, please be aware these medications can cause nausea and constipation. Prevent  nausea by taking the medication with a snack or meal. Avoid constipation by drinking plenty of fluids and eating foods with high amount of fiber, such as fruits, vegetables, and grains. Do not take Tylenol if you are taking prescription pain medications. ° ° ° ° °Follow up °Your surgeon may want to see you in the office following your access surgery. If so, this will be arranged at the time of your surgery. ° °Please call us immediately for any of the following conditions: ° °Increased pain, redness, drainage (pus) from your incision site °Fever of 101 degrees or higher °Severe or worsening pain at your incision site °Hand pain or numbness. ° °Reduce your risk of vascular disease: ° °Stop smoking. If you would like help, call [facility] at 1-800-QUIT-NOW (1-800-784-8669) or [facility] at 336-586-4000 ° °Manage your cholesterol °Maintain a desired weight °Control your diabetes °Keep your blood pressure down ° °Dialysis ° °It will take several weeks to several months for your new dialysis access to be ready for use. Your surgeon will determine when it is OK to use it. Your nephrologist will continue to direct your dialysis. You can continue to use your Permcath until your new access is ready for use. ° °If you have any questions, please call the office at [phone]. ° °
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
## 2019-01-28 NOTE — Progress Notes (Signed)
Medicine attending: I examined this patient today together with resident physician Dr. Neva Seat and acting intern Ms. Elsie Lincoln and I concur with her evaluation and management plan.  Clinically stable at time of our a.m. rounds.  He proceeded with planned placement of a right tunneled dialysis catheter via a internal jugular approach and creation of a left upper arm single stage basilic vein AV fistula.  I believe he is already connected with an outpatient dialysis center.  He should be able to go home tomorrow after dialysis here.

## 2019-01-28 NOTE — Progress Notes (Signed)
Patient back from surgery and complaining of burning at incision site. Nurse educated that the skin adhesive can cause that feeling. Patient also asked if we could wrap the arm because the adhesive is sticking to his skin.   Patient was given oral medication without relief. Page Physician to make aware and ask for another form of pain control. Patient did not have IV pain medication prior to surgery as his pain was controlled orally.

## 2019-01-28 NOTE — Anesthesia Procedure Notes (Addendum)
Procedure Name: LMA Insertion Date/Time: 01/28/2019 11:14 AM Performed by: Orlie Dakin, CRNA Pre-anesthesia Checklist: Patient identified, Emergency Drugs available, Suction available and Patient being monitored Patient Re-evaluated:Patient Re-evaluated prior to induction Oxygen Delivery Method: Circle system utilized Preoxygenation: Pre-oxygenation with 100% oxygen Induction Type: IV induction Ventilation: Mask ventilation without difficulty LMA: LMA inserted LMA Size: 5.0 Tube type: Oral Number of attempts: 1 Placement Confirmation: positive ETCO2 Tube secured with: Tape Dental Injury: Teeth and Oropharynx as per pre-operative assessment

## 2019-01-28 NOTE — Op Note (Signed)
Patient name: Brian [name] MRN: [id number] DOB: 09-Mar-1976 Sex: male  01/28/2019 Pre-operative Diagnosis: End-stage renal disease Post-operative diagnosis:  Same Surgeon:  Erlene Quan C. Donzetta Matters, MD Assistant: Arlee Muslim, PA Procedure Performed: 1.  Right IJ 19 cm tunneled dialysis catheter placement with fluoroscopic guidance 2.  Left upper arm single stage basilic vein transposition fistula creation  Indications: 43 year old male previously on dialysis via left arm basilic vein fistula.  This is thrombosed.  He is on dialysis via temporary catheter at this time is indicated for tunneled catheterization as well as conversion to an upper arm AV fistula.  Findings: Existing catheter was wired and a new 19 cm catheter was placed terminating at the SVC atrial junction.  The upper arm basilic vein was thickened from previous access was suitable for creation of new fistula in a single stage and at completion had very strong thrill confirmed with Doppler there was also palpable radial pulse at the wrist.   Procedure:  The patient was identified in the holding area and taken to the operating room where is placed supine operative table general anesthesia induced.  He was sterilely prepped and draped in the bilateral neck and chest in the left upper extremity.  Antibiotics were administered and timeout was called.  I began by dividing his existing catheter and placing a wire centrally and clamping the catheter.  I then tunneled a new 19 cm catheter to a separate stab incision trimmed to size and assembled it.  I placed the introducer sheath through the right IJ and then placed the catheter terminating at the SVC atrial junction with fluoroscopic guidance.  This is affixed the skin with 3-0 nylon suture and the neck was closed with 4-0 Monocryl dermal was placed to both sides.  Was flushed with initially heparinized saline and then locked with concentrated heparin 1.5 cc in either port and a sterile dressing  was placed.  We then turned our attention to the left upper extremity where he had a previous basilic vein fistula.  He can palpate the vein just above the antecubital curvilinear incision was made between that and the palpable brachial artery pulse above the antecubitum.  I dissected down first identified the vein.  I made 2 serial skip incisions to dissect out the vein entirely.  I also dissected out the artery just above the antecubitum place a vessel loop around this was noted to be healthy.  Branches of the vein were taken between clips and ties and I was able to preserve the nerve throughout its course.  I then transected the vein just above the antecubitum tied it off distally.  I flushed with heparinized saline there was one area that I repaired with interrupted 6-0 Prolene suture.  I marked it for orientation and tunneled it to the incision just above the antecubitum.  I then clamped the artery distally proximally opened longitudinally flushed with heparinized saline both directions.  I sewed the vein end-to-side with 6-0 Prolene suture.  Prior to completion anastomosis allowed flushing all directions.  Upon completion there was a very strong thrill in the runoff vein with very good Doppler signal.  There is also palpable radial pulse at the wrist confirmed with Doppler as well.  Satisfied with this we obtained hemostasis closed in layers with Vicryl Monocryl.  Dermabond placed to level skin.  All counts were correct at completion.  He is awakened anesthesia having tolerated procedure well.immediate complication.  He was transferred to the PACU in stable condition.  EBL: 50 cc   [name] C. Donzetta Matters, MD [facility] Office: [phone] Pager: [phone]

## 2019-01-28 NOTE — Anesthesia Preprocedure Evaluation (Signed)
Anesthesia Evaluation  Patient identified by MRN, date of birth, ID band Patient awake    Reviewed: Allergy & Precautions, H&P , NPO status , Patient's Chart, lab work & pertinent test results  History of Anesthesia Complications (+) AWARENESS UNDER ANESTHESIA and history of anesthetic complications  Airway Mallampati: II  TM Distance: >3 FB Neck ROM: Full    Dental no notable dental hx. (+) Edentulous Upper, Edentulous Lower, Dental Advisory Given   Pulmonary shortness of breath and with exertion, pneumonia, resolved, COPD,  COPD inhaler, Current Smoker,    Pulmonary exam normal breath sounds clear to auscultation       Cardiovascular hypertension, Pt. on medications + Peripheral Vascular Disease and +CHF  Normal cardiovascular exam+ Valvular Problems/Murmurs  Rhythm:Regular Rate:Normal  Hx/o Bilateral DVT's LE Hx/o PTE Study Conclusions  - Left ventricle: The cavity size was severely dilated. Wall   thickness was increased in a pattern of mild LVH. Systolic   function was severely reduced. The estimated ejection fraction   was in the range of 20% to 25%. Diffuse hypokinesis. The study is   not technically sufficient to allow evaluation of LV diastolic   function. - Aortic valve: Mildly calcified annulus. Trileaflet; mildly   thickened leaflets. Valve area (VTI): 5.27 cm^2. Valve area   (Vmax): 5.67 cm^2. - Mitral valve: Mildly calcified annulus. Mildly thickened leaflets   . There was moderate regurgitation. - Left atrium: The atrium was severely dilated. - Right atrium: The atrium was moderately dilated. - Atrial septum: No defect or patent foramen ovale was identified.   Neuro/Psych PSYCHIATRIC DISORDERS Anxiety Depression Peripheral Neuropathy Restless legs syndrome  Neuromuscular disease    GI/Hepatic Neg liver ROS, hiatal hernia, (+)     substance abuse  alcohol use, Cysts in liver   Endo/Other  negative  endocrine ROS  Renal/GU CRFRenal diseasePolycystic kidney disease S/P left nephrectomy Dialysis M W F  negative genitourinary   Musculoskeletal Chronic back pain   Abdominal Normal abdominal exam  (+)   Peds  Hematology  (+) anemia , HIV, Coumadin- Last dose 08/25/2017   Anesthesia Other Findings   Reproductive/Obstetrics negative OB ROS                             Anesthesia Physical  Anesthesia Plan  ASA: III  Anesthesia Plan: General   Post-op Pain Management:    Induction: Intravenous  PONV Risk Score and Plan: 2 and Ondansetron and Dexamethasone  Airway Management Planned: LMA  Additional Equipment:   Intra-op Plan:   Post-operative Plan:   Informed Consent: I have reviewed the patients History and Physical, chart, labs and discussed the procedure including the risks, benefits and alternatives for the proposed anesthesia with the patient or authorized representative who has indicated his/her understanding and acceptance.     Dental advisory given  Plan Discussed with: CRNA  Anesthesia Plan Comments:         Anesthesia Quick Evaluation

## 2019-01-28 NOTE — Progress Notes (Signed)
  Progress Note    01/28/2019 10:30 AM Day of Surgery  Subjective:  No overnight issues  Vitals:   01/28/19 0400 01/28/19 0805  BP: (!) 114/91 (!) 117/96  Pulse:  (!) 108  Resp:  16  Temp: 97.7 F (36.5 C) (!) 97.5 F (36.4 C)  SpO2: 100% 96%    Physical Exam: [id number] Palpable left brachial and radial pulses Left forearm avf is thrombosed  CBC    Component Value Date/Time   WBC 2.9 (L) 01/28/2019 0411   RBC 3.40 (L) 01/28/2019 0411   HGB 11.9 (L) 01/28/2019 0411   HCT 37.2 (L) 01/28/2019 0411   PLT 88 (L) 01/28/2019 0411   MCV 109.4 (H) 01/28/2019 0411   MCH 35.0 (H) 01/28/2019 0411   MCHC 32.0 01/28/2019 0411   RDW 17.4 (H) 01/28/2019 0411   LYMPHSABS 1.1 01/24/2019 1629   MONOABS 0.5 01/24/2019 1629   EOSABS 0.0 01/24/2019 1629   BASOSABS 0.0 01/24/2019 1629    BMET    Component Value Date/Time   NA 136 01/28/2019 0411   K 4.6 01/28/2019 0411   CL 94 (L) 01/28/2019 0411   CO2 27 01/28/2019 0411   GLUCOSE 95 01/28/2019 0411   BUN 27 (H) 01/28/2019 0411   CREATININE 8.34 (H) 01/28/2019 0411   CREATININE 9.98 (H) 03/10/2018 1044   CALCIUM 8.9 01/28/2019 0411   CALCIUM 8.4 10/14/2011 1432   GFRNONAA 7 (L) 01/28/2019 0411   GFRNONAA 8 (L) 12/22/2014 1526   GFRAA 8 (L) 01/28/2019 0411   GFRAA 9 (L) 12/22/2014 1526    INR    Component Value Date/Time   INR 1.03 01/28/2019 0411   INR 2.71 12/27/2015     Intake/Output Summary (Last 24 hours) at 01/28/2019 1030 Last data filed at 01/27/2019 1653 Gross per 24 hour  Intake -  Output 3000 ml  Net -3000 ml     Assessment/plan:  43 y.o. male is here with esrd, currently on hd via temp catheter in right IJ, in need of tunneled catheter and left upper arm access. Plan for tdc and left upper arm avf vs graft.  Marbella Markgraf C. Donzetta Matters, MD [facility] [place] Office: [phone] Pager: [phone]  01/28/2019 10:30 AM

## 2019-01-28 NOTE — Progress Notes (Signed)
Physician called back and added order (see MAR).

## 2019-01-28 NOTE — Progress Notes (Signed)
   Chest x-ray demonstrates catheter in satisfactory position and is okay for use.  Left upper arm fistula with strong thrill there is a palpable left radial pulse and the hand is well-perfused.  He will follow-up in 4 to 6 weeks in our office with dialysis duplex.    We will plan to be able to use his fistula at 6 weeks given that it has previously matured from forearm fistula.  Brandon C. Donzetta Matters, MD [facility] [place] Office: [phone] Pager: [phone]

## 2019-01-28 NOTE — Anesthesia Postprocedure Evaluation (Signed)
Anesthesia Post Note  Patient: Brian [name]  Procedure(s) Performed: CREATION OF BASILIC VEIN ARTERIOVENOUS FISTULA LEFT ARM (Left Arm Upper) INSERTION OF TUNNELED DIALYSIS CATHETER, right internal jugular (Right Chest)     Patient location during evaluation: PACU Anesthesia Type: General Level of consciousness: awake Pain management: pain level controlled Vital Signs Assessment: post-procedure vital signs reviewed and stable Respiratory status: spontaneous breathing Cardiovascular status: stable Postop Assessment: no apparent nausea or vomiting Anesthetic complications: no    Last Vitals:  Vitals:   01/28/19 1333 01/28/19 1350  BP:  110/79  Pulse:  (!) 117  Resp:    Temp: 36.5 C 36.7 C  SpO2:  97%    Last Pain:  Vitals:   01/28/19 1350  TempSrc: Oral  PainSc:    Pain Goal: Patients Stated Pain Goal: 4 (01/25/19 1506)                 Huston Foley

## 2019-01-28 NOTE — Transfer of Care (Signed)
Immediate Anesthesia Transfer of Care Note  Patient: Brian [name]  Procedure(s) Performed: CREATION OF BASILIC VEIN ARTERIOVENOUS FISTULA LEFT ARM (Left Arm Upper) INSERTION OF TUNNELED DIALYSIS CATHETER, right internal jugular (Right Chest)  Patient Location: PACU  Anesthesia Type:General  Level of Consciousness: drowsy  Airway & Oxygen Therapy: Patient Spontanous Breathing and Patient connected to face mask oxygen  Post-op Assessment: Report given to RN and Post -op Vital signs reviewed and stable  Post vital signs: Reviewed and stable  Last Vitals:  Vitals Value Taken Time  BP 101/73 01/28/2019 12:56 PM  Temp    Pulse 86 01/28/2019 12:58 PM  Resp 10 01/28/2019 12:58 PM  SpO2 98 % 01/28/2019 12:58 PM  Vitals shown include unvalidated device data.  Last Pain:  Vitals:   01/28/19 0805  TempSrc: Oral  PainSc:       Patients Stated Pain Goal: 4 (65/68/12 7517)  Complications: No apparent anesthesia complications

## 2019-01-29 ENCOUNTER — Encounter (HOSPITAL_COMMUNITY): Payer: Self-pay | Admitting: Vascular Surgery

## 2019-01-29 DIAGNOSIS — D631 Anemia in chronic kidney disease: Secondary | ICD-10-CM

## 2019-01-29 LAB — CBC
HCT: 32.1 % — ABNORMAL LOW (ref 39.0–52.0)
HEMOGLOBIN: 9.9 g/dL — AB (ref 13.0–17.0)
MCH: 35 pg — AB (ref 26.0–34.0)
MCHC: 30.8 g/dL (ref 30.0–36.0)
MCV: 113.4 fL — ABNORMAL HIGH (ref 80.0–100.0)
Platelets: 92 10*3/uL — ABNORMAL LOW (ref 150–400)
RBC: 2.83 MIL/uL — ABNORMAL LOW (ref 4.22–5.81)
RDW: 17.3 % — ABNORMAL HIGH (ref 11.5–15.5)
WBC: 4.4 10*3/uL (ref 4.0–10.5)
nRBC: 0 % (ref 0.0–0.2)

## 2019-01-29 LAB — RENAL FUNCTION PANEL
ANION GAP: 14 (ref 5–15)
Albumin: 3.4 g/dL — ABNORMAL LOW (ref 3.5–5.0)
BUN: 39 mg/dL — ABNORMAL HIGH (ref 6–20)
CO2: 23 mmol/L (ref 22–32)
Calcium: 8.5 mg/dL — ABNORMAL LOW (ref 8.9–10.3)
Chloride: 96 mmol/L — ABNORMAL LOW (ref 98–111)
Creatinine, Ser: 9.81 mg/dL — ABNORMAL HIGH (ref 0.61–1.24)
GFR calc Af Amer: 7 mL/min — ABNORMAL LOW (ref >60)
GFR calc non Af Amer: 6 mL/min — ABNORMAL LOW (ref >60)
Glucose, Bld: 104 mg/dL — ABNORMAL HIGH (ref 70–99)
POTASSIUM: 4.5 mmol/L (ref 3.5–5.1)
Phosphorus: 6 mg/dL — ABNORMAL HIGH (ref 2.5–4.6)
Sodium: 133 mmol/L — ABNORMAL LOW (ref 135–145)

## 2019-01-29 MED ORDER — HEPARIN SODIUM (PORCINE) 1000 UNIT/ML DIALYSIS
2000.0000 [IU] | INTRAMUSCULAR | Status: DC | PRN
  Administered 2019-01-29: 2000 [IU] via INTRAVENOUS_CENTRAL

## 2019-01-29 MED ORDER — OXYCODONE HCL 10 MG PO TABS: 10.0000 mg | ORAL_TABLET | Freq: Four times a day (QID) | ORAL | 0 refills | Status: AC | PRN

## 2019-01-29 MED ORDER — HEPARIN SODIUM (PORCINE) 1000 UNIT/ML IJ SOLN
INTRAMUSCULAR | Status: AC
  Filled 2019-01-29: qty 2

## 2019-01-29 NOTE — Progress Notes (Signed)
Subjective:  Seen on HD, no c/o.  Neg UF 3.5 L  Objective Vital signs in last 24 hours: Vitals:   01/29/19 1000 01/29/19 1030 01/29/19 1058 01/29/19 1144  BP: 129/75 (!) 84/56 (!) 117/50 126/83  Pulse: 99 (!) 54 61 (!) 126  Resp:   16 18  Temp:   97.7 F (36.5 C) 97.9 F (36.6 C)  TempSrc:   Oral Oral  SpO2:   100% 100%  Weight:   82.1 kg   Height:       Weight change: 1 kg  Physical Exam: General: on Hd  alert , chronically ill appearing  Male , NAD  Heart: RRR, no rug, mur, gallop Lungs: CTA unlabored breathing  Abdomen: BS pos , soft , NT, ND Extremities: no pedal edema  Dialysis Access:  IJ Tem p hd cath, L Brachiocephalic AVF  No Bruit     OP HD=[facility] MWF  4h  82kg  500/800  F200  2/2 bath  P1  Heparin 5000 Calcitriol 0.25 mcg q rx  Assessment/ Plan 1Hyperkalemia: missed 2 HD sessions w/ clotted access. Had temp cath and HD , now is sp new R IJ TDC and LUA 1-stage BVT AVF done by Dr Donzetta Matters 2/13.  AVF will be ready sooner than usual as the vein has already matured due to the forearm AVF.   2 ESRD:MWF [facility] , back on schedule. HD today.     3 Hypertension: BP's good, volume good, UF to dry today on hd  4. 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 HIV: on ART   7.  Dispo - stable for d/c from renal standpoint   [facility] 01/29/2019, 2:03 PM    Labs: Basic Metabolic Panel: Recent Labs  Lab 01/26/19 0449 01/27/19 1152 01/28/19 0411 01/29/19 0705  NA 134* 132* 136 133*  K 4.6 5.2* 4.6 4.5  CL 97* 90* 94* 96*  CO2 23 22 27 23   GLUCOSE 99 101* 95 104*  BUN 45* 55* 27* 39*  CREATININE 11.65* 14.34* 8.34* 9.81*  CALCIUM 8.8* 9.1 8.9 8.5*  PHOS 5.8* 6.1*  --  6.0*   Liver Function Tests: Recent Labs  Lab 01/24/19 1629  01/26/19 0449  01/27/19 1152 01/29/19 0705  AST 27  --   --   --   --   ALT 16  --   --   --   --   ALKPHOS 79  --   --   --   --   BILITOT 1.2  --   --   --   --   PROT 7.0  --   --   --   --   ALBUMIN 3.8   < > 3.3* 3.7 3.4*   < > = values in this interval not displayed.   Recent Labs  Lab 01/24/19 1629  LIPASE 28   No results for input(s): AMMONIA in the last 168 hours. CBC: Recent Labs  Lab 01/22/19 1839 01/24/19 1629  01/25/19 0208 01/27/19 1250 01/28/19 0411 01/29/19 0705  WBC 3.0* 4.4  --  4.9 2.5* 2.9* 4.4  NEUTROABS 1.4* 2.9  --   --   --   --   --  HGB 10.7* 11.8*   < > 10.4* 10.6* 11.9* 9.9*  HCT 32.7* 36.1*   < > 31.5* 33.2* 37.2* 32.1*  MCV 108.6* 111.1*  --  109.0* 110.3* 109.4* 113.4*  PLT 90* 111*  --  99* 83* 88* 92*   < > = values in this interval not displayed.   Cardiac Enzymes: No results for input(s): CKTOTAL, CKMB, CKMBINDEX, TROPONINI in the last 168 hours. CBG: Recent Labs  Lab 01/24/19 1632 01/24/19 1737 01/24/19 1842 01/24/19 2126 01/25/19 0118  GLUCAP 56* 142* 120* 52* 123*     Medications: . sodium chloride    . sodium chloride    . sodium chloride 10 mL/hr at 01/28/19 1033   . Chlorhexidine Gluconate Cloth  6 each Topical Q0600  . Chlorhexidine Gluconate Cloth  6 each Topical Q0600  . diazepam  5 mg Oral QHS  . dolutegravir  50 mg Oral Daily  . dronabinol  5 mg Oral QAC lunch  . emtricitabine-tenofovir AF  1 tablet Oral Daily  . ferric citrate  210 mg Oral With snacks  . ferric citrate  420 mg Oral TID WC  . heparin  5,000 Units Subcutaneous Q8H  . nicotine  21 mg Transdermal Daily  . polyethylene glycol  17 g Oral Daily  . rOPINIRole  0.25 mg Oral QHS

## 2019-01-29 NOTE — Progress Notes (Signed)
Medicine attending discharge note: Clinical status and medical database reviewed with resident physician Dr. Neva Seat and acting intern Ms. Armanda Magic and I concur with their evaluation and management plan. Clinically stable.  Tunneled catheter via right internal jugular approach and creation of left upper arm AV fistula performed without incident yesterday. He is otherwise stable for discharge today after dialysis.  Impression: End-stage renal disease related to polycystic kidney disease.  Status post previous unilateral nephrectomy. He was admitted on February 9 after missed dialysis sessions due to thrombosis of vascular access.  Hyperkalemia to 7.9.  Urgent temporary dialysis catheter placed.  Potassium and acid-base stabilized with urgent dialysis treatment. New access placed as outlined above.  Disposition: Condition stable at time of discharge He will continue outpatient dialysis. AV fistula are estimated to be mature in about 6 weeks. Follow-up with nephrology and in our general medicine clinic There were no complications

## 2019-01-29 NOTE — Care Management Important Message (Signed)
Important Message  Patient Details  Name: Brian [name] MRN: [id number] Date of Birth: [birth year]   Medicare Important Message Given:  Yes    Brian [name] Montine Circle 01/29/2019, 4:08 PM
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
## 2019-02-01 DIAGNOSIS — N2581 Secondary hyperparathyroidism of renal origin: Secondary | ICD-10-CM | POA: Diagnosis not present

## 2019-02-01 DIAGNOSIS — N186 End stage renal disease: Secondary | ICD-10-CM | POA: Diagnosis not present

## 2019-02-01 DIAGNOSIS — S51802A Unspecified open wound of left forearm, initial encounter: Secondary | ICD-10-CM | POA: Diagnosis not present

## 2019-02-01 DIAGNOSIS — D509 Iron deficiency anemia, unspecified: Secondary | ICD-10-CM | POA: Diagnosis not present

## 2019-02-01 DIAGNOSIS — T8183XA Persistent postprocedural fistula, initial encounter: Secondary | ICD-10-CM | POA: Diagnosis not present

## 2019-02-01 DIAGNOSIS — D631 Anemia in chronic kidney disease: Secondary | ICD-10-CM | POA: Diagnosis not present

## 2019-02-03 DIAGNOSIS — T8183XA Persistent postprocedural fistula, initial encounter: Secondary | ICD-10-CM | POA: Diagnosis not present

## 2019-02-03 DIAGNOSIS — D631 Anemia in chronic kidney disease: Secondary | ICD-10-CM | POA: Diagnosis not present

## 2019-02-03 DIAGNOSIS — D509 Iron deficiency anemia, unspecified: Secondary | ICD-10-CM | POA: Diagnosis not present

## 2019-02-03 DIAGNOSIS — S51802A Unspecified open wound of left forearm, initial encounter: Secondary | ICD-10-CM | POA: Diagnosis not present

## 2019-02-03 DIAGNOSIS — N186 End stage renal disease: Secondary | ICD-10-CM | POA: Diagnosis not present

## 2019-02-03 DIAGNOSIS — N2581 Secondary hyperparathyroidism of renal origin: Secondary | ICD-10-CM | POA: Diagnosis not present

## 2019-02-04 ENCOUNTER — Other Ambulatory Visit: Payer: Self-pay | Admitting: Internal Medicine

## 2019-02-05 DIAGNOSIS — D631 Anemia in chronic kidney disease: Secondary | ICD-10-CM | POA: Diagnosis not present

## 2019-02-05 DIAGNOSIS — N2581 Secondary hyperparathyroidism of renal origin: Secondary | ICD-10-CM | POA: Diagnosis not present

## 2019-02-05 DIAGNOSIS — N186 End stage renal disease: Secondary | ICD-10-CM | POA: Diagnosis not present

## 2019-02-05 DIAGNOSIS — S51802A Unspecified open wound of left forearm, initial encounter: Secondary | ICD-10-CM | POA: Diagnosis not present

## 2019-02-05 DIAGNOSIS — T8183XA Persistent postprocedural fistula, initial encounter: Secondary | ICD-10-CM | POA: Diagnosis not present

## 2019-02-05 DIAGNOSIS — D509 Iron deficiency anemia, unspecified: Secondary | ICD-10-CM | POA: Diagnosis not present

## 2019-02-06 ENCOUNTER — Encounter (HOSPITAL_COMMUNITY): Payer: Self-pay | Admitting: Emergency Medicine

## 2019-02-06 ENCOUNTER — Other Ambulatory Visit: Payer: Self-pay

## 2019-02-06 ENCOUNTER — Emergency Department (HOSPITAL_COMMUNITY)
Admission: EM | Admit: 2019-02-06 | Discharge: 2019-02-06 | Disposition: A | Discharge status: Home / Self Care | Payer: Medicare Other | Attending: Emergency Medicine | Admitting: Emergency Medicine

## 2019-02-06 DIAGNOSIS — R109 Unspecified abdominal pain: Secondary | ICD-10-CM | POA: Diagnosis not present

## 2019-02-06 DIAGNOSIS — Z86711 Personal history of pulmonary embolism: Secondary | ICD-10-CM | POA: Diagnosis not present

## 2019-02-06 DIAGNOSIS — L03114 Cellulitis of left upper limb: Secondary | ICD-10-CM | POA: Diagnosis not present

## 2019-02-06 DIAGNOSIS — L03119 Cellulitis of unspecified part of limb: Secondary | ICD-10-CM

## 2019-02-06 DIAGNOSIS — F17210 Nicotine dependence, cigarettes, uncomplicated: Secondary | ICD-10-CM | POA: Insufficient documentation

## 2019-02-06 DIAGNOSIS — Z79899 Other long term (current) drug therapy: Secondary | ICD-10-CM | POA: Insufficient documentation

## 2019-02-06 DIAGNOSIS — G8929 Other chronic pain: Secondary | ICD-10-CM | POA: Insufficient documentation

## 2019-02-06 DIAGNOSIS — B20 Human immunodeficiency virus [HIV] disease: Secondary | ICD-10-CM | POA: Diagnosis not present

## 2019-02-06 DIAGNOSIS — Z86718 Personal history of other venous thrombosis and embolism: Secondary | ICD-10-CM | POA: Diagnosis not present

## 2019-02-06 DIAGNOSIS — G8918 Other acute postprocedural pain: Secondary | ICD-10-CM | POA: Diagnosis not present

## 2019-02-06 DIAGNOSIS — I120 Hypertensive chronic kidney disease with stage 5 chronic kidney disease or end stage renal disease: Secondary | ICD-10-CM | POA: Insufficient documentation

## 2019-02-06 DIAGNOSIS — N186 End stage renal disease: Secondary | ICD-10-CM | POA: Diagnosis not present

## 2019-02-06 DIAGNOSIS — Z992 Dependence on renal dialysis: Secondary | ICD-10-CM | POA: Diagnosis not present

## 2019-02-06 LAB — CBC WITH DIFFERENTIAL/PLATELET
Abs Immature Granulocytes: 0.01 10*3/uL (ref 0.00–0.07)
Basophils Absolute: 0 10*3/uL (ref 0.0–0.1)
Basophils Relative: 1 %
Eosinophils Absolute: 0.1 10*3/uL (ref 0.0–0.5)
Eosinophils Relative: 2 %
HCT: 40.2 % (ref 39.0–52.0)
Hemoglobin: 12.6 g/dL — ABNORMAL LOW (ref 13.0–17.0)
Immature Granulocytes: 0 %
Lymphocytes Relative: 39 %
Lymphs Abs: 1.2 10*3/uL (ref 0.7–4.0)
MCH: 34.6 pg — ABNORMAL HIGH (ref 26.0–34.0)
MCHC: 31.3 g/dL (ref 30.0–36.0)
MCV: 110.4 fL — ABNORMAL HIGH (ref 80.0–100.0)
Monocytes Absolute: 0.3 10*3/uL (ref 0.1–1.0)
Monocytes Relative: 10 %
Neutro Abs: 1.5 10*3/uL — ABNORMAL LOW (ref 1.7–7.7)
Neutrophils Relative %: 48 %
Platelets: 103 10*3/uL — ABNORMAL LOW (ref 150–400)
RBC: 3.64 MIL/uL — ABNORMAL LOW (ref 4.22–5.81)
RDW: 16.4 % — ABNORMAL HIGH (ref 11.5–15.5)
WBC: 3.1 10*3/uL — ABNORMAL LOW (ref 4.0–10.5)
nRBC: 0 % (ref 0.0–0.2)

## 2019-02-06 LAB — COMPREHENSIVE METABOLIC PANEL
ALT: 6 U/L (ref 0–44)
AST: 22 U/L (ref 15–41)
Albumin: 3.8 g/dL (ref 3.5–5.0)
Alkaline Phosphatase: 75 U/L (ref 38–126)
Anion gap: 16 — ABNORMAL HIGH (ref 5–15)
BUN: 28 mg/dL — ABNORMAL HIGH (ref 6–20)
CO2: 25 mmol/L (ref 22–32)
Calcium: 9.2 mg/dL (ref 8.9–10.3)
Chloride: 92 mmol/L — ABNORMAL LOW (ref 98–111)
Creatinine, Ser: 7.86 mg/dL — ABNORMAL HIGH (ref 0.61–1.24)
GFR calc Af Amer: 9 mL/min — ABNORMAL LOW (ref >60)
GFR calc non Af Amer: 8 mL/min — ABNORMAL LOW (ref >60)
Glucose, Bld: 122 mg/dL — ABNORMAL HIGH (ref 70–99)
Potassium: 4.4 mmol/L (ref 3.5–5.1)
Sodium: 133 mmol/L — ABNORMAL LOW (ref 135–145)
Total Bilirubin: 1 mg/dL (ref 0.3–1.2)
Total Protein: 7.8 g/dL (ref 6.5–8.1)

## 2019-02-06 LAB — LACTIC ACID, PLASMA: Lactic Acid, Venous: 1.8 mmol/L (ref 0.5–1.9)

## 2019-02-06 MED ORDER — HYDROMORPHONE HCL 1 MG/ML IJ SOLN
1.0000 mg | Freq: Once | INTRAMUSCULAR | Status: AC
  Administered 2019-02-06: 1 mg via INTRAVENOUS
  Filled 2019-02-06: qty 1

## 2019-02-06 MED ORDER — CEPHALEXIN 250 MG PO CAPS: 250.0000 mg | ORAL_CAPSULE | Freq: Three times a day (TID) | ORAL | 0 refills | Status: AC

## 2019-02-06 MED ORDER — MORPHINE SULFATE (PF) 4 MG/ML IV SOLN
4.0000 mg | Freq: Once | INTRAVENOUS | Status: AC
  Administered 2019-02-06: 4 mg via INTRAVENOUS
  Filled 2019-02-06: qty 1

## 2019-02-06 MED ORDER — HYDROMORPHONE HCL 1 MG/ML IJ SOLN: 0.5000 mg | Freq: Once | INTRAMUSCULAR | Status: DC

## 2019-02-06 NOTE — Discharge Instructions (Addendum)
Please stop your current antibiotics and START KEFLEX 250mg  three times a day. Follow up with your surgeon. Thank you for allowing me to care for you today. Please return to the emergency department if you have new or worsening symptoms. Take your medications as instructed.

## 2019-02-06 NOTE — ED Triage Notes (Signed)
Pt reports HD cath placed last week with incisions to the left arm. States that incisions may be  Infected.

## 2019-02-06 NOTE — ED Notes (Signed)
Patient verbalizes understanding of discharge instructions. Opportunity for questioning and answers were provided. Armband removed by staff, pt discharged from ED.  

## 2019-02-06 NOTE — Consult Note (Signed)
Patient name: Brian [name] MRN: [id number] DOB: 1976-05-28 Sex: male  REASON FOR CONSULT:   Cellulitis left arm.  This patient was sent from the dialysis center.  HPI:   Brian [name] is a pleasant 43 y.o. male who had a right IJ tunneled dialysis catheter placed and a left upper arm basilic vein transposition created on 01/28/2019.  He dialyzes on Monday Wednesdays and Fridays.  He was at dialysis yesterday and there was some bruising in the arm and some cellulitis.  He apparently received some antibiotics intravenously.  He was told that if the redness did not improve he should report to the emergency department on Saturday.  He showed up in the emergency room and I was consulted to evaluate the left arm.  He noted some redness around the distal incision and significant ecchymosis in the upper arm and the forearm.  He denies any pain or paresthesias in the left arm except for some pain along the ulnar distribution of the forearm.  No current facility-administered medications for this encounter.    Current Outpatient Medications  Medication Sig Dispense Refill  . albuterol (PROAIR HFA) 108 (90 Base) MCG/ACT inhaler Inhale 2 puffs into the lungs every 6 (six) hours as needed for wheezing or shortness of breath. 2 Inhaler 11  . DESCOVY 200-25 MG tablet TAKE 1 TABLET BY MOUTH DAILY (Patient taking differently: Take 1 tablet by mouth daily at 12 noon. ) 30 tablet 5  . diazepam (VALIUM) 5 MG tablet Take 5 mg by mouth at bedtime.   0  . dronabinol (MARINOL) 5 MG capsule TAKE 1 CAPSULE(5 MG) BY MOUTH DAILY BEFORE LUNCH. DO NOT FILL BEFORE 30 DAYS FROM BEFORE (Patient taking differently: Take 5 mg by mouth daily before lunch. ) 30 capsule 0  . ferric citrate (AURYXIA) 1 GM 210 MG(Fe) tablet Take 210-420 mg by mouth See admin instructions. Take 2 tablets (420 mg) by mouth with meals and 1 tablet (210 mg) with snacks.    . fluticasone (FLONASE) 50 MCG/ACT nasal spray USE 2 SPRAYS IN EACH NOSTRIL  DAILY (Patient taking differently: Place 2 sprays into both nostrils daily as needed for allergies. ) 16 g 11  . lactulose, encephalopathy, (GENERLAC) 10 GM/15ML SOLN Take 20 g by mouth daily as needed (constipation).    [name] oxyCODONE-acetaminophen (PERCOCET) 10-325 MG tablet Take 1 tablet by mouth every 12 (twelve) hours as needed for pain. (Patient taking differently: Take 1 tablet by mouth every 8 (eight) hours as needed for pain. ) 60 tablet 0  . rOPINIRole (REQUIP) 0.25 MG tablet Take 0.25 mg by mouth at bedtime.   3  . TIVICAY 50 MG tablet TAKE 1 TABLET BY MOUTH DAILY (Patient taking differently: Take 50 mg by mouth daily at 12 noon. ) 30 tablet 5  . cephALEXin (KEFLEX) 250 MG capsule Take 1 capsule (250 mg total) by mouth 3 (three) times daily for 5 days. 15 capsule 0    REVIEW OF SYSTEMS:  [X]  denotes positive finding, [ ]  denotes negative finding Vascular    Leg swelling    Cardiac    Chest pain or chest pressure:    Shortness of breath upon exertion:    Short of breath when lying flat:    Irregular heart rhythm:    Constitutional    Fever or chills:     PHYSICAL EXAM:   Vitals:   02/06/19 1244 02/06/19 1250 02/06/19 1701  BP:  118/74 114/72  Pulse:  (!) 103  91  Resp:  20 18  Temp:  (!) 97.5 F (36.4 C) 98 F (36.7 C)  TempSrc:  Oral Oral  SpO2:  97% 96%  Weight: 82.1 kg    Height: 6\' 3"  (1.905 m)      GENERAL: The patient is a well-nourished male, in no acute distress. The vital signs are documented above. CARDIOVASCULAR: There is a regular rate and rhythm. PULMONARY: There is good air exchange bilaterally without wheezing or rales. VASCULAR: He has a palpable left radial pulse.  He has an excellent thrill in his upper arm fistula.  He has ecchymosis in the upper arm and forearm.  He has some mild cellulitis at the distal incision.  DATA:   LABS: His white blood cell count is 3.1.  Potassium is 4.4.  MEDICAL ISSUES:   STATUS POST LEFT BASILIC VEIN TRANSPOSITION:  The patient's basilic vein transposition is working well and he has a palpable radial pulse.  He has ecchymosis in the forearm and upper arm but I do not think this is worrisome I do not think he has evidence of any active bleeding.  He has some mild cellulitis of the distal incision and I have recommended that he be started on Keflex which the emergency department has done.  He will keep his regularly scheduled follow-up appointment.  He will call sooner if there are problems.  Brian [name] [facility] [phone]
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
## 2019-02-06 NOTE — ED Provider Notes (Signed)
    Physical Exam  BP 114/72 (BP Location: Right Arm)   Pulse 91   Temp 98 F (36.7 C) (Oral)   Resp 18   Ht 6\' 3"  (1.905 m)   Wt 82.1 kg   SpO2 96%   BMI 22.62 kg/m   Physical Exam  ED Course/Procedures   Clinical Course as of Feb 06 1725  Sat Feb 06, 2019  1455 I discussed patient case with vascular surgeon, Dr. Nicole Cella nurse in the OR, who advised that Dr. Scot Dock would evaluate the patient when he is out of the OR.   [AL]  1510 Patient had no pain relief with morphine 4 mg, will order Dilaudid 1 mg.  Patient is a chronic opioid user.   [AL]  1726 Patient stable, discussed with Dr. Scot Dock. Afebrile, to start PO antibiotics   [KM]    Clinical Course User Index [AL] Frederica Kuster, PA-C [KM] Alveria Apley, PA-C    Procedures  MDM  Case discussed with Dr. Scot Dock Vascular surgery and Dr. Regenia Skeeter. Patient to start keflex TID and f/u with surgeon. Stable for discharge.       Alveria Apley, PA-C 02/06/19 1726    Sherwood Gambler, MD 02/06/19 [phone]

## 2019-02-08 DIAGNOSIS — T8183XA Persistent postprocedural fistula, initial encounter: Secondary | ICD-10-CM | POA: Diagnosis not present

## 2019-02-08 DIAGNOSIS — S51802A Unspecified open wound of left forearm, initial encounter: Secondary | ICD-10-CM | POA: Diagnosis not present

## 2019-02-08 DIAGNOSIS — D631 Anemia in chronic kidney disease: Secondary | ICD-10-CM | POA: Diagnosis not present

## 2019-02-08 DIAGNOSIS — D509 Iron deficiency anemia, unspecified: Secondary | ICD-10-CM | POA: Diagnosis not present

## 2019-02-08 DIAGNOSIS — N2581 Secondary hyperparathyroidism of renal origin: Secondary | ICD-10-CM | POA: Diagnosis not present

## 2019-02-08 DIAGNOSIS — N186 End stage renal disease: Secondary | ICD-10-CM | POA: Diagnosis not present

## 2019-02-10 DIAGNOSIS — S51802A Unspecified open wound of left forearm, initial encounter: Secondary | ICD-10-CM | POA: Diagnosis not present

## 2019-02-10 DIAGNOSIS — N186 End stage renal disease: Secondary | ICD-10-CM | POA: Diagnosis not present

## 2019-02-10 DIAGNOSIS — N2581 Secondary hyperparathyroidism of renal origin: Secondary | ICD-10-CM | POA: Diagnosis not present

## 2019-02-10 DIAGNOSIS — D509 Iron deficiency anemia, unspecified: Secondary | ICD-10-CM | POA: Diagnosis not present

## 2019-02-10 DIAGNOSIS — D631 Anemia in chronic kidney disease: Secondary | ICD-10-CM | POA: Diagnosis not present

## 2019-02-10 DIAGNOSIS — T8183XA Persistent postprocedural fistula, initial encounter: Secondary | ICD-10-CM | POA: Diagnosis not present

## 2019-02-12 DIAGNOSIS — D631 Anemia in chronic kidney disease: Secondary | ICD-10-CM | POA: Diagnosis not present

## 2019-02-12 DIAGNOSIS — N2581 Secondary hyperparathyroidism of renal origin: Secondary | ICD-10-CM | POA: Diagnosis not present

## 2019-02-12 DIAGNOSIS — D509 Iron deficiency anemia, unspecified: Secondary | ICD-10-CM | POA: Diagnosis not present

## 2019-02-12 DIAGNOSIS — S51802A Unspecified open wound of left forearm, initial encounter: Secondary | ICD-10-CM | POA: Diagnosis not present

## 2019-02-12 DIAGNOSIS — T8183XA Persistent postprocedural fistula, initial encounter: Secondary | ICD-10-CM | POA: Diagnosis not present

## 2019-02-12 DIAGNOSIS — N186 End stage renal disease: Secondary | ICD-10-CM | POA: Diagnosis not present

## 2019-02-14 DIAGNOSIS — N186 End stage renal disease: Secondary | ICD-10-CM | POA: Diagnosis not present

## 2019-02-14 DIAGNOSIS — I120 Hypertensive chronic kidney disease with stage 5 chronic kidney disease or end stage renal disease: Secondary | ICD-10-CM | POA: Diagnosis not present

## 2019-02-14 DIAGNOSIS — Z992 Dependence on renal dialysis: Secondary | ICD-10-CM | POA: Diagnosis not present

## 2019-02-15 DIAGNOSIS — E875 Hyperkalemia: Secondary | ICD-10-CM | POA: Diagnosis not present

## 2019-02-15 DIAGNOSIS — N186 End stage renal disease: Secondary | ICD-10-CM | POA: Diagnosis not present

## 2019-02-15 DIAGNOSIS — D631 Anemia in chronic kidney disease: Secondary | ICD-10-CM | POA: Diagnosis not present

## 2019-02-15 DIAGNOSIS — D509 Iron deficiency anemia, unspecified: Secondary | ICD-10-CM | POA: Diagnosis not present

## 2019-02-15 DIAGNOSIS — N2581 Secondary hyperparathyroidism of renal origin: Secondary | ICD-10-CM | POA: Diagnosis not present

## 2019-02-16 ENCOUNTER — Other Ambulatory Visit (INDEPENDENT_AMBULATORY_CARE_PROVIDER_SITE_OTHER): Payer: Medicare Other

## 2019-02-16 DIAGNOSIS — D6859 Other primary thrombophilia: Secondary | ICD-10-CM | POA: Diagnosis not present

## 2019-02-16 DIAGNOSIS — Z86711 Personal history of pulmonary embolism: Secondary | ICD-10-CM

## 2019-02-16 DIAGNOSIS — D735 Infarction of spleen: Secondary | ICD-10-CM

## 2019-02-16 DIAGNOSIS — Z79891 Long term (current) use of opiate analgesic: Secondary | ICD-10-CM | POA: Diagnosis not present

## 2019-02-17 DIAGNOSIS — E875 Hyperkalemia: Secondary | ICD-10-CM | POA: Diagnosis not present

## 2019-02-17 DIAGNOSIS — N2581 Secondary hyperparathyroidism of renal origin: Secondary | ICD-10-CM | POA: Diagnosis not present

## 2019-02-17 DIAGNOSIS — N186 End stage renal disease: Secondary | ICD-10-CM | POA: Diagnosis not present

## 2019-02-17 DIAGNOSIS — D631 Anemia in chronic kidney disease: Secondary | ICD-10-CM | POA: Diagnosis not present

## 2019-02-17 DIAGNOSIS — D509 Iron deficiency anemia, unspecified: Secondary | ICD-10-CM | POA: Diagnosis not present

## 2019-02-18 LAB — BETA-2-GLYCOPROTEIN I ABS, IGG/M/A
Beta-2 Glyco 1 IgA: <9 GPI IgA units (ref 0–25)
Beta-2 Glyco 1 IgM: <9 GPI IgM units (ref 0–32)

## 2019-02-18 LAB — CBC WITH DIFFERENTIAL/PLATELET
BASOS: 1 %
Basophils Absolute: 0 10*3/uL (ref 0.0–0.2)
EOS (ABSOLUTE): 0 10*3/uL (ref 0.0–0.4)
Eos: 1 %
Hematocrit: 35 % — ABNORMAL LOW (ref 37.5–51.0)
Hemoglobin: 11.7 g/dL — ABNORMAL LOW (ref 13.0–17.7)
Immature Grans (Abs): 0 10*3/uL (ref 0.0–0.1)
Immature Granulocytes: 0 %
Lymphocytes Absolute: 1.3 10*3/uL (ref 0.7–3.1)
Lymphs: 36 %
MCH: 34.4 pg — ABNORMAL HIGH (ref 26.6–33.0)
MCHC: 33.4 g/dL (ref 31.5–35.7)
MCV: 103 fL — ABNORMAL HIGH (ref 79–97)
Monocytes Absolute: 0.4 10*3/uL (ref 0.1–0.9)
Monocytes: 11 %
Neutrophils Absolute: 1.7 10*3/uL (ref 1.4–7.0)
Neutrophils: 51 %
Platelets: 130 10*3/uL — ABNORMAL LOW (ref 150–450)
RBC: 3.4 x10E6/uL — ABNORMAL LOW (ref 4.14–5.80)
RDW: 15.1 % (ref 11.6–15.4)
WBC: 3.4 10*3/uL (ref 3.4–10.8)

## 2019-02-18 LAB — ANTITHROMBIN III: AntiThromb III Func: 100 % (ref 75–135)

## 2019-02-18 LAB — PROTEIN C ACTIVITY: Protein C Activity: 84 % (ref 73–180)

## 2019-02-18 LAB — CARDIOLIPIN ANTIBODIES, IGG, IGM, IGA
Anticardiolipin IgA: <9 APL U/mL (ref 0–11)
Anticardiolipin IgG: 10 GPL U/mL (ref 0–14)
Anticardiolipin IgM: <9 MPL U/mL (ref 0–12)

## 2019-02-18 LAB — LUPUS ANTICOAGULANT PANEL
Dilute Viper Venom Time: 49.9 s — ABNORMAL HIGH (ref 0.0–47.0)
PTT Lupus Anticoagulant: 39.6 s (ref 0.0–51.9)

## 2019-02-18 LAB — PROTEIN S ACTIVITY: Protein S Activity: 107 % (ref 63–140)

## 2019-02-18 LAB — PROTEIN C, TOTAL: Protein C Antigen: 80 % (ref 60–150)

## 2019-02-18 LAB — DRVVT MIX: DRVVT MIX: 42.6 s (ref 0.0–47.0)

## 2019-02-18 LAB — PROTEIN S, TOTAL: Protein S Ag, Total: 98 % (ref 60–150)

## 2019-02-19 DIAGNOSIS — N186 End stage renal disease: Secondary | ICD-10-CM | POA: Diagnosis not present

## 2019-02-19 DIAGNOSIS — D631 Anemia in chronic kidney disease: Secondary | ICD-10-CM | POA: Diagnosis not present

## 2019-02-19 DIAGNOSIS — N2581 Secondary hyperparathyroidism of renal origin: Secondary | ICD-10-CM | POA: Diagnosis not present

## 2019-02-19 DIAGNOSIS — E875 Hyperkalemia: Secondary | ICD-10-CM | POA: Diagnosis not present

## 2019-02-19 DIAGNOSIS — D509 Iron deficiency anemia, unspecified: Secondary | ICD-10-CM | POA: Diagnosis not present

## 2019-02-19 MED ORDER — OXYCODONE-ACETAMINOPHEN 10-325 MG PO TABS: 0.5000 | ORAL_TABLET | Freq: Three times a day (TID) | ORAL | 0 refills | Status: DC | PRN

## 2019-02-19 NOTE — Addendum Note (Signed)
Addended by: Meryl Dare on: 02/19/2019 04:35 PM   Modules accepted: Orders

## 2019-02-22 DIAGNOSIS — D631 Anemia in chronic kidney disease: Secondary | ICD-10-CM | POA: Diagnosis not present

## 2019-02-22 DIAGNOSIS — Z86711 Personal history of pulmonary embolism: Secondary | ICD-10-CM | POA: Diagnosis not present

## 2019-02-22 DIAGNOSIS — D509 Iron deficiency anemia, unspecified: Secondary | ICD-10-CM | POA: Diagnosis not present

## 2019-02-22 DIAGNOSIS — N186 End stage renal disease: Secondary | ICD-10-CM | POA: Diagnosis not present

## 2019-02-22 DIAGNOSIS — N2581 Secondary hyperparathyroidism of renal origin: Secondary | ICD-10-CM | POA: Diagnosis not present

## 2019-02-22 DIAGNOSIS — E875 Hyperkalemia: Secondary | ICD-10-CM | POA: Diagnosis not present

## 2019-02-22 LAB — FACTOR 5 LEIDEN

## 2019-02-22 LAB — PROTHROMBIN GENE MUTATION

## 2019-02-24 DIAGNOSIS — D631 Anemia in chronic kidney disease: Secondary | ICD-10-CM | POA: Diagnosis not present

## 2019-02-24 DIAGNOSIS — N186 End stage renal disease: Secondary | ICD-10-CM | POA: Diagnosis not present

## 2019-02-24 DIAGNOSIS — N2581 Secondary hyperparathyroidism of renal origin: Secondary | ICD-10-CM | POA: Diagnosis not present

## 2019-02-24 DIAGNOSIS — E875 Hyperkalemia: Secondary | ICD-10-CM | POA: Diagnosis not present

## 2019-02-24 DIAGNOSIS — D509 Iron deficiency anemia, unspecified: Secondary | ICD-10-CM | POA: Diagnosis not present

## 2019-02-25 ENCOUNTER — Ambulatory Visit (INDEPENDENT_AMBULATORY_CARE_PROVIDER_SITE_OTHER): Payer: Medicare Other | Admitting: Internal Medicine

## 2019-02-25 ENCOUNTER — Other Ambulatory Visit: Payer: Self-pay

## 2019-02-25 ENCOUNTER — Ambulatory Visit (HOSPITAL_COMMUNITY)
Admission: RE | Admit: 2019-02-25 | Discharge: 2019-02-25 | Disposition: A | Discharge status: Home / Self Care | Payer: Medicare Other | Source: Ambulatory Visit

## 2019-02-25 ENCOUNTER — Encounter: Payer: Self-pay | Admitting: Internal Medicine

## 2019-02-25 ENCOUNTER — Ambulatory Visit (HOSPITAL_COMMUNITY)
Admission: RE | Admit: 2019-02-25 | Discharge: 2019-02-25 | Disposition: A | Discharge status: Home / Self Care | Payer: Medicare Other | Source: Ambulatory Visit | Attending: Internal Medicine | Admitting: Internal Medicine

## 2019-02-25 ENCOUNTER — Ambulatory Visit (INDEPENDENT_AMBULATORY_CARE_PROVIDER_SITE_OTHER): Payer: Medicare Other | Admitting: Pharmacist

## 2019-02-25 VITALS — BP 124/76 | HR 118 | Temp 97.8°F | Ht 75.0 in | Wt 187.8 lb

## 2019-02-25 DIAGNOSIS — R0600 Dyspnea, unspecified: Secondary | ICD-10-CM

## 2019-02-25 DIAGNOSIS — R011 Cardiac murmur, unspecified: Secondary | ICD-10-CM

## 2019-02-25 DIAGNOSIS — Z86718 Personal history of other venous thrombosis and embolism: Secondary | ICD-10-CM

## 2019-02-25 DIAGNOSIS — Z5181 Encounter for therapeutic drug level monitoring: Secondary | ICD-10-CM | POA: Diagnosis not present

## 2019-02-25 DIAGNOSIS — Z86711 Personal history of pulmonary embolism: Secondary | ICD-10-CM

## 2019-02-25 DIAGNOSIS — B20 Human immunodeficiency virus [HIV] disease: Secondary | ICD-10-CM

## 2019-02-25 DIAGNOSIS — R0602 Shortness of breath: Secondary | ICD-10-CM | POA: Diagnosis not present

## 2019-02-25 DIAGNOSIS — R0609 Other forms of dyspnea: Secondary | ICD-10-CM

## 2019-02-25 DIAGNOSIS — J449 Chronic obstructive pulmonary disease, unspecified: Secondary | ICD-10-CM

## 2019-02-25 DIAGNOSIS — Z992 Dependence on renal dialysis: Secondary | ICD-10-CM | POA: Diagnosis not present

## 2019-02-25 DIAGNOSIS — I5020 Unspecified systolic (congestive) heart failure: Secondary | ICD-10-CM

## 2019-02-25 DIAGNOSIS — R0789 Other chest pain: Secondary | ICD-10-CM | POA: Insufficient documentation

## 2019-02-25 DIAGNOSIS — I712 Thoracic aortic aneurysm, without rupture: Secondary | ICD-10-CM

## 2019-02-25 DIAGNOSIS — R079 Chest pain, unspecified: Secondary | ICD-10-CM

## 2019-02-25 DIAGNOSIS — Z7901 Long term (current) use of anticoagulants: Secondary | ICD-10-CM | POA: Diagnosis not present

## 2019-02-25 DIAGNOSIS — I5022 Chronic systolic (congestive) heart failure: Secondary | ICD-10-CM

## 2019-02-25 DIAGNOSIS — N186 End stage renal disease: Secondary | ICD-10-CM

## 2019-02-25 LAB — BASIC METABOLIC PANEL
Anion gap: 16 — ABNORMAL HIGH (ref 5–15)
BUN: 33 mg/dL — ABNORMAL HIGH (ref 6–20)
CO2: 23 mmol/L (ref 22–32)
Calcium: 8.5 mg/dL — ABNORMAL LOW (ref 8.9–10.3)
Chloride: 95 mmol/L — ABNORMAL LOW (ref 98–111)
Creatinine, Ser: 7.25 mg/dL — ABNORMAL HIGH (ref 0.61–1.24)
GFR calc Af Amer: 10 mL/min — ABNORMAL LOW (ref >60)
GFR calc non Af Amer: 8 mL/min — ABNORMAL LOW (ref >60)
Glucose, Bld: 75 mg/dL (ref 70–99)
Potassium: 4.9 mmol/L (ref 3.5–5.1)
Sodium: 134 mmol/L — ABNORMAL LOW (ref 135–145)

## 2019-02-25 LAB — CBC
HCT: 34.6 % — ABNORMAL LOW (ref 39.0–52.0)
Hemoglobin: 11.4 g/dL — ABNORMAL LOW (ref 13.0–17.0)
MCH: 35 pg — ABNORMAL HIGH (ref 26.0–34.0)
MCHC: 32.9 g/dL (ref 30.0–36.0)
MCV: 106.1 fL — ABNORMAL HIGH (ref 80.0–100.0)
Platelets: 97 10*3/uL — ABNORMAL LOW (ref 150–400)
RBC: 3.26 MIL/uL — ABNORMAL LOW (ref 4.22–5.81)
RDW: 16 % — ABNORMAL HIGH (ref 11.5–15.5)
WBC: 3.7 10*3/uL — ABNORMAL LOW (ref 4.0–10.5)
nRBC: 0 % (ref 0.0–0.2)

## 2019-02-25 LAB — POCT INR: INR: 1.1 — AB (ref 2.0–3.0)

## 2019-02-25 LAB — TROPONIN I: Troponin I: 0.07 ng/mL (ref <0.03)

## 2019-02-25 MED ORDER — IOHEXOL 350 MG/ML SOLN
100.0000 mL | Freq: Once | INTRAVENOUS | Status: AC | PRN
  Administered 2019-02-25: 100 mL via INTRAVENOUS
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
## 2019-02-25 NOTE — Patient Instructions (Addendum)
Brian [name],  It was a pleasure taking care of your the clinic today.  The shortness of breath you have been having got a little concerned about you possibly having a blood clot in your lungs.  So just like we discussed, we are going to order imaging of your chest and depending on the results we will have further discussion about the management.  We are also getting other labs to check for heart attack and also anemia.  Dr. Eileen Stanford  Please call the internal medicine center clinic if you have any questions or concerns, we may be able to help and keep you from a long and expensive emergency room wait. Our clinic and after hours phone number is [phone], the best time to call is Monday through Friday 9 am to 4 pm but there is always someone available 24/7 if you have an emergency. If you need medication refills please notify your pharmacy one week in advance and they will send [place] a request.
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
## 2019-02-25 NOTE — Progress Notes (Signed)
COUMADIN CLINIC PROGRESS NOTE  Patient came in for a follow-up Coumadin Clinic. 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 Today patient had an INR of 1.1 from POC testing. Patient complained of chest-feeling that felt similar to a past PE. Spoke with Dr. Rebeca Alert who said that they will see patient in an acute care visit.    Thank you for allowing pharmacy to be a part of this patient's care.  Tamela Gammon, PharmD 02/25/2019 2:57 PM PGY-1 Pharmacy Resident Direct Phone: [phone] Please check [email or web address] for unit-specific pharmacist phone numbers

## 2019-02-25 NOTE — Patient Instructions (Addendum)
Patient to see Dr.Raines for [facility] conversion visit.

## 2019-02-25 NOTE — Progress Notes (Signed)
INTERNAL MEDICINE TEACHING ATTENDING ADDENDUM  As documented, he complained of chest pain and shortness of breath and he was converted to an acute care visit. Please see separate documentation from Dr. Eileen Stanford for details.   Oda Kilts, MD

## 2019-02-26 ENCOUNTER — Telehealth: Payer: Self-pay | Admitting: Internal Medicine

## 2019-02-26 ENCOUNTER — Other Ambulatory Visit: Payer: Self-pay | Admitting: Internal Medicine

## 2019-02-26 DIAGNOSIS — E875 Hyperkalemia: Secondary | ICD-10-CM | POA: Diagnosis not present

## 2019-02-26 DIAGNOSIS — R0609 Other forms of dyspnea: Principal | ICD-10-CM

## 2019-02-26 DIAGNOSIS — D631 Anemia in chronic kidney disease: Secondary | ICD-10-CM | POA: Diagnosis not present

## 2019-02-26 DIAGNOSIS — N2581 Secondary hyperparathyroidism of renal origin: Secondary | ICD-10-CM | POA: Diagnosis not present

## 2019-02-26 DIAGNOSIS — N186 End stage renal disease: Secondary | ICD-10-CM | POA: Diagnosis not present

## 2019-02-26 DIAGNOSIS — D509 Iron deficiency anemia, unspecified: Secondary | ICD-10-CM | POA: Diagnosis not present

## 2019-02-26 MED ORDER — WARFARIN SODIUM 5 MG PO TABS: 7.5000 mg | ORAL_TABLET | Freq: Every day | ORAL | 3 refills | Status: DC

## 2019-02-26 NOTE — Telephone Encounter (Signed)
Thanks Bonnita Nasuti, '  I have reached out to Mr. Maharaj. Wasn't able to talk to him but I left a voice mail stating that I have placed an order for him to have an echocardiogram.

## 2019-02-26 NOTE — Progress Notes (Signed)
Internal Medicine Clinic Attending  I saw and evaluated the patient.  I personally confirmed the key portions of the history and exam documented by Dr. Eileen Stanford and I reviewed pertinent patient test results.  The assessment, diagnosis, and plan were formulated together and I agree with the documentation in the resident's note.  43 yo man with ESRD on dialysis, HFrEF, COPD, HIV, smoking, thoracic aortic aneurysm, who originally came for a pharmacy appointment for warfarin reinitiation, but was converted to an acute care visit due to fatigue, chest pain, and dyspnea. He has a history of at least 2 prior PE, both thought to be provoked, and he had stopped warfarin after a SDH. However, he has had multiple issues with clotted dialysis access recently and nephrology had recommended restarting warfarin.   He says his dyspnea on exertion and fatigue has been going on for a few weeks. He doesn't really feel like doing much of anything. He has very mild chest pain with exertion as well, but says it feels similar to prior PE and prior episode of "walking pneumonia".   On exam, he is comfortable and breathing normally. Lungs are clear. He is tachycardic with bounding carotid pulses and a 2/6 systolic murmur at the left sternal border. No LE edema. JVP difficult to assess due to strong carotid pulse, but appears at the base of the neck sitting upright. Abdomen is tender but soft.   I am most concerned about recurrent PE, which would require admission for heparin bridging to warfarin. He does not seem to have significant volume overload to suggest ADHF. Angina is possible, EKG has some non-specific ST changes, no active chest pain or pressure or dyspnea at this time. Will check troponin and admit if elevated. His murmur is concerning for a valvular problem, but he reports it is long-standing. It could be related to his AV fistula. No evidence of COPD exacerbation, no wheezing or coughing.   CTPE obtained from clinic and  is negative. BMP, CBC with no significant changes. Troponin low at 0.07, not concerning in the context of ESRD and no active chest pain or dyspnea.   He would likely benefit from TTE to reevaluate cardiac function and valves. We should also evaluate for depression on follow up. Ok to initiate warfarin as an outpatient as planned.   Lenice Pressman, M.D., Ph.D.

## 2019-02-26 NOTE — Telephone Encounter (Signed)
Pls return patient call regarding results from yesterday [phone]; pt will be able to talk at this number after 105pm he is at dialysis

## 2019-02-26 NOTE — Telephone Encounter (Signed)
Pt calls and is upset, he is distressed over the cause of his short of breath. He would like a physician to call him, passed message on to dr Eileen Stanford, will send as fyi to dr blum and dr Rebeca Alert

## 2019-03-01 DIAGNOSIS — N186 End stage renal disease: Secondary | ICD-10-CM | POA: Diagnosis not present

## 2019-03-01 DIAGNOSIS — D631 Anemia in chronic kidney disease: Secondary | ICD-10-CM | POA: Diagnosis not present

## 2019-03-01 DIAGNOSIS — D509 Iron deficiency anemia, unspecified: Secondary | ICD-10-CM | POA: Diagnosis not present

## 2019-03-01 DIAGNOSIS — N2581 Secondary hyperparathyroidism of renal origin: Secondary | ICD-10-CM | POA: Diagnosis not present

## 2019-03-01 DIAGNOSIS — E875 Hyperkalemia: Secondary | ICD-10-CM | POA: Diagnosis not present

## 2019-03-02 ENCOUNTER — Ambulatory Visit (INDEPENDENT_AMBULATORY_CARE_PROVIDER_SITE_OTHER): Payer: Medicare Other | Admitting: Pharmacist

## 2019-03-02 ENCOUNTER — Other Ambulatory Visit: Payer: Self-pay

## 2019-03-02 DIAGNOSIS — Z86711 Personal history of pulmonary embolism: Secondary | ICD-10-CM

## 2019-03-02 DIAGNOSIS — Z5181 Encounter for therapeutic drug level monitoring: Secondary | ICD-10-CM | POA: Diagnosis not present

## 2019-03-02 DIAGNOSIS — Z7901 Long term (current) use of anticoagulants: Secondary | ICD-10-CM | POA: Diagnosis not present

## 2019-03-02 DIAGNOSIS — Z86718 Personal history of other venous thrombosis and embolism: Secondary | ICD-10-CM

## 2019-03-02 LAB — BENZODIAZEPINES,MS,WB/SP RFX
7-Aminoclonazepam: NEGATIVE ng/mL
Alprazolam: NEGATIVE ng/mL
Benzodiazepines Confirm: NEGATIVE
CHLORDIAZEPOXIDE: NEGATIVE ng/mL
Clonazepam: NEGATIVE ng/mL
DIAZEPAM: NEGATIVE ng/mL
Desalkylflurazepam: NEGATIVE ng/mL
Desmethylchlordiazepoxide: NEGATIVE ng/mL
Desmethyldiazepam: NEGATIVE ng/mL
Flurazepam: NEGATIVE ng/mL
Lorazepam: NEGATIVE ng/mL
Midazolam: NEGATIVE ng/mL
Oxazepam: NEGATIVE ng/mL
Temazepam: NEGATIVE ng/mL
Triazolam: NEGATIVE ng/mL

## 2019-03-02 LAB — DRUG SCREEN 10 W/CONF, WB
Amphetamines, IA: NEGATIVE ng/mL
Barbiturates, IA: NEGATIVE ug/mL
Benzodiazepines, IA: NEGATIVE ng/mL
Cocaine/Metabolite, IA: NEGATIVE ng/mL
Methadone, IA: NEGATIVE ng/mL
OPIATES, IA: NEGATIVE ng/mL
OXYCODONES, IA: POSITIVE ng/mL — AB
Phencyclidine, IA: NEGATIVE ng/mL
Propoxyphene, IA: NEGATIVE ng/mL
THC (MARIJUANA) MTB,IA: POSITIVE ng/mL — AB

## 2019-03-02 LAB — OXYCODONES,MS,WB/SP RFX
Oxycocone: 1.5 ng/mL
Oxycodones Confirmation: POSITIVE
Oxymorphone: NEGATIVE ng/mL

## 2019-03-02 LAB — THC,MS,WB/SP RFX
CANNABINOL: NEGATIVE ng/mL
CARBOXY-THC: 22.2 ng/mL
Cannabidiol: NEGATIVE ng/mL
Cannabinoid Confirmation: POSITIVE
HYDROXY-THC: NEGATIVE ng/mL
Tetrahydrocannabinol(THC): 1.3 ng/mL

## 2019-03-02 LAB — POCT INR: INR: 1.6 — AB (ref 2.0–3.0)

## 2019-03-03 ENCOUNTER — Encounter: Payer: Self-pay | Admitting: Pharmacist

## 2019-03-03 DIAGNOSIS — N186 End stage renal disease: Secondary | ICD-10-CM | POA: Diagnosis not present

## 2019-03-03 DIAGNOSIS — D509 Iron deficiency anemia, unspecified: Secondary | ICD-10-CM | POA: Diagnosis not present

## 2019-03-03 DIAGNOSIS — D631 Anemia in chronic kidney disease: Secondary | ICD-10-CM | POA: Diagnosis not present

## 2019-03-03 DIAGNOSIS — N2581 Secondary hyperparathyroidism of renal origin: Secondary | ICD-10-CM | POA: Diagnosis not present

## 2019-03-03 DIAGNOSIS — E875 Hyperkalemia: Secondary | ICD-10-CM | POA: Diagnosis not present

## 2019-03-04 ENCOUNTER — Other Ambulatory Visit: Payer: Self-pay | Admitting: Pharmacist

## 2019-03-04 ENCOUNTER — Other Ambulatory Visit (HOSPITAL_COMMUNITY): Payer: Self-pay

## 2019-03-04 DIAGNOSIS — Z86711 Personal history of pulmonary embolism: Secondary | ICD-10-CM

## 2019-03-04 DIAGNOSIS — Z79891 Long term (current) use of opiate analgesic: Secondary | ICD-10-CM

## 2019-03-05 DIAGNOSIS — D509 Iron deficiency anemia, unspecified: Secondary | ICD-10-CM | POA: Diagnosis not present

## 2019-03-05 DIAGNOSIS — N186 End stage renal disease: Secondary | ICD-10-CM | POA: Diagnosis not present

## 2019-03-05 DIAGNOSIS — E875 Hyperkalemia: Secondary | ICD-10-CM | POA: Diagnosis not present

## 2019-03-05 DIAGNOSIS — N2581 Secondary hyperparathyroidism of renal origin: Secondary | ICD-10-CM | POA: Diagnosis not present

## 2019-03-05 DIAGNOSIS — D631 Anemia in chronic kidney disease: Secondary | ICD-10-CM | POA: Diagnosis not present

## 2019-03-08 DIAGNOSIS — N2581 Secondary hyperparathyroidism of renal origin: Secondary | ICD-10-CM | POA: Diagnosis not present

## 2019-03-08 DIAGNOSIS — D631 Anemia in chronic kidney disease: Secondary | ICD-10-CM | POA: Diagnosis not present

## 2019-03-08 DIAGNOSIS — D509 Iron deficiency anemia, unspecified: Secondary | ICD-10-CM | POA: Diagnosis not present

## 2019-03-08 DIAGNOSIS — N186 End stage renal disease: Secondary | ICD-10-CM | POA: Diagnosis not present

## 2019-03-08 DIAGNOSIS — E875 Hyperkalemia: Secondary | ICD-10-CM | POA: Diagnosis not present

## 2019-03-10 DIAGNOSIS — D631 Anemia in chronic kidney disease: Secondary | ICD-10-CM | POA: Diagnosis not present

## 2019-03-10 DIAGNOSIS — N186 End stage renal disease: Secondary | ICD-10-CM | POA: Diagnosis not present

## 2019-03-10 DIAGNOSIS — N2581 Secondary hyperparathyroidism of renal origin: Secondary | ICD-10-CM | POA: Diagnosis not present

## 2019-03-10 DIAGNOSIS — E875 Hyperkalemia: Secondary | ICD-10-CM | POA: Diagnosis not present

## 2019-03-10 DIAGNOSIS — D509 Iron deficiency anemia, unspecified: Secondary | ICD-10-CM | POA: Diagnosis not present

## 2019-03-11 ENCOUNTER — Other Ambulatory Visit: Payer: Self-pay

## 2019-03-11 ENCOUNTER — Telehealth (HOSPITAL_COMMUNITY): Payer: Self-pay | Admitting: Rehabilitation

## 2019-03-11 ENCOUNTER — Ambulatory Visit (HOSPITAL_COMMUNITY): Payer: Medicare Other

## 2019-03-11 ENCOUNTER — Other Ambulatory Visit: Payer: Self-pay | Admitting: *Deleted

## 2019-03-11 DIAGNOSIS — Z79891 Long term (current) use of opiate analgesic: Secondary | ICD-10-CM | POA: Diagnosis not present

## 2019-03-11 DIAGNOSIS — Z86711 Personal history of pulmonary embolism: Secondary | ICD-10-CM

## 2019-03-11 DIAGNOSIS — N186 End stage renal disease: Secondary | ICD-10-CM

## 2019-03-11 DIAGNOSIS — Z992 Dependence on renal dialysis: Principal | ICD-10-CM

## 2019-03-11 NOTE — Telephone Encounter (Signed)
The above patient or their representative was contacted and gave the following answers to these questions:         Do you have any of the following symptoms? No  Fever                    Cough                   Shortness of breath  Do  you have any of the following other symptoms? No   muscle pain         vomiting,        diarrhea        rash         weakness        red eye        abdominal pain         bruising          bruising or bleeding              joint pain           severe headache    Have you been in contact with someone who was or has been sick in the past 2 weeks? No  Yes                 Unsure                         Unable to assess   Does the person that you were in contact with have any of the following symptoms? N/A  Cough         shortness of breath           muscle pain         vomiting,            diarrhea            rash            weakness           fever            red eye           abdominal pain           bruising  or  bleeding                joint pain                severe headache               Have you  or someone you have been in contact with traveled internationally in th last month?   No      If yes, which countries?   Have you  or someone you have been in contact with traveled outside [place] in th last month? No        If yes, which state and city?   COMMENTS OR ACTION PLAN FOR THIS PATIENT:

## 2019-03-12 ENCOUNTER — Encounter: Payer: Self-pay | Admitting: Family

## 2019-03-12 ENCOUNTER — Ambulatory Visit (INDEPENDENT_AMBULATORY_CARE_PROVIDER_SITE_OTHER): Payer: Self-pay | Admitting: Family

## 2019-03-12 ENCOUNTER — Ambulatory Visit (HOSPITAL_COMMUNITY)
Admission: RE | Admit: 2019-03-12 | Discharge: 2019-03-12 | Disposition: A | Discharge status: Home / Self Care | Payer: Medicare Other | Source: Ambulatory Visit | Attending: Vascular Surgery | Admitting: Vascular Surgery

## 2019-03-12 ENCOUNTER — Ambulatory Visit: Payer: Self-pay

## 2019-03-12 ENCOUNTER — Other Ambulatory Visit: Payer: Self-pay

## 2019-03-12 VITALS — BP 124/79 | HR 106 | Temp 96.9°F | Resp 16 | Ht 75.0 in | Wt 185.0 lb

## 2019-03-12 DIAGNOSIS — D631 Anemia in chronic kidney disease: Secondary | ICD-10-CM | POA: Diagnosis not present

## 2019-03-12 DIAGNOSIS — F17200 Nicotine dependence, unspecified, uncomplicated: Secondary | ICD-10-CM

## 2019-03-12 DIAGNOSIS — Z992 Dependence on renal dialysis: Secondary | ICD-10-CM

## 2019-03-12 DIAGNOSIS — N186 End stage renal disease: Secondary | ICD-10-CM | POA: Diagnosis not present

## 2019-03-12 DIAGNOSIS — E875 Hyperkalemia: Secondary | ICD-10-CM | POA: Diagnosis not present

## 2019-03-12 DIAGNOSIS — D509 Iron deficiency anemia, unspecified: Secondary | ICD-10-CM | POA: Diagnosis not present

## 2019-03-12 DIAGNOSIS — N2581 Secondary hyperparathyroidism of renal origin: Secondary | ICD-10-CM | POA: Diagnosis not present

## 2019-03-12 LAB — PROTIME-INR

## 2019-03-12 NOTE — Patient Instructions (Signed)
Steps to Quit Smoking    Smoking tobacco can be bad for your health. It can also affect almost every organ in your body. Smoking puts you and people around you at risk for many serious long-lasting (chronic) diseases. Quitting smoking is hard, but it is one of the best things that you can do for your health. It is never too late to quit.  What are the benefits of quitting smoking?  When you quit smoking, you lower your risk for getting serious diseases and conditions. They can include:  · Lung cancer or lung disease.  · Heart disease.  · Stroke.  · Heart attack.  · Not being able to have children (infertility).  · Weak bones (osteoporosis) and broken bones (fractures).  If you have coughing, wheezing, and shortness of breath, those symptoms may get better when you quit. You may also get sick less often. If you are pregnant, quitting smoking can help to lower your chances of having a baby of low birth weight.  What can I do to help me quit smoking?  Talk with your doctor about what can help you quit smoking. Some things you can do (strategies) include:  · Quitting smoking totally, instead of slowly cutting back how much you smoke over a period of time.  · Going to in-person counseling. You are more likely to quit if you go to many counseling sessions.  · Using resources and support systems, such as:  ? Online chats with a counselor.  ? Phone quitlines.  ? Printed self-help materials.  ? Support groups or group counseling.  ? Text messaging programs.  ? Mobile phone apps or applications.  · Taking medicines. Some of these medicines may have nicotine in them. If you are pregnant or breastfeeding, do not take any medicines to quit smoking unless your doctor says it is okay. Talk with your doctor about counseling or other things that can help you.  Talk with your doctor about using more than one strategy at the same time, such as taking medicines while you are also going to in-person counseling. This can help make  quitting easier.  What things can I do to make it easier to quit?  Quitting smoking might feel very hard at first, but there is a lot that you can do to make it easier. Take these steps:  · Talk to your family and friends. Ask them to support and encourage you.  · Call phone quitlines, reach out to support groups, or work with a counselor.  · Ask people who smoke to not smoke around you.  · Avoid places that make you want (trigger) to smoke, such as:  ? Bars.  ? Parties.  ? Smoke-break areas at work.  · Spend time with people who do not smoke.  · Lower the stress in your life. Stress can make you want to smoke. Try these things to help your stress:  ? Getting regular exercise.  ? Deep-breathing exercises.  ? Yoga.  ? Meditating.  ? Doing a body scan. To do this, close your eyes, focus on one area of your body at a time from head to toe, and notice which parts of your body are tense. Try to relax the muscles in those areas.  · Download or buy apps on your mobile phone or tablet that can help you stick to your quit plan. There are many free apps, such as QuitGuide from the CDC (Centers for Disease Control and Prevention). You can find more   support from smokefree.gov and other websites.  This information is not intended to replace advice given to you by your health care provider. Make sure you discuss any questions you have with your health care provider.  Document Released: 09/28/2009 Document Revised: 07/30/2016 Document Reviewed: 04/18/2015  Elsevier Interactive Patient Education © 2019 Elsevier Inc.
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
## 2019-03-15 ENCOUNTER — Telehealth: Payer: Self-pay | Admitting: Pharmacist

## 2019-03-15 DIAGNOSIS — D509 Iron deficiency anemia, unspecified: Secondary | ICD-10-CM | POA: Diagnosis not present

## 2019-03-15 DIAGNOSIS — N2581 Secondary hyperparathyroidism of renal origin: Secondary | ICD-10-CM | POA: Diagnosis not present

## 2019-03-15 DIAGNOSIS — D631 Anemia in chronic kidney disease: Secondary | ICD-10-CM | POA: Diagnosis not present

## 2019-03-15 DIAGNOSIS — N186 End stage renal disease: Secondary | ICD-10-CM | POA: Diagnosis not present

## 2019-03-15 DIAGNOSIS — E875 Hyperkalemia: Secondary | ICD-10-CM | POA: Diagnosis not present
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
## 2019-03-15 NOTE — Telephone Encounter (Signed)
Patient was called with his INR collected last week at [facility]. INR = 2.5 on 52.5mg  warfarin/wk (as 1 and 1/2 x 5mg  = 7.5mg  daily). Advised to continue this regimen. Denies any sx/symptoms of bleeding or embolic events. Next INR 12-Apr-2019.

## 2019-03-16 ENCOUNTER — Ambulatory Visit: Payer: Medicare Other | Admitting: Internal Medicine

## 2019-03-17 DIAGNOSIS — N186 End stage renal disease: Secondary | ICD-10-CM | POA: Diagnosis not present

## 2019-03-17 DIAGNOSIS — D509 Iron deficiency anemia, unspecified: Secondary | ICD-10-CM | POA: Diagnosis not present

## 2019-03-17 DIAGNOSIS — Z992 Dependence on renal dialysis: Secondary | ICD-10-CM | POA: Diagnosis not present

## 2019-03-17 DIAGNOSIS — D631 Anemia in chronic kidney disease: Secondary | ICD-10-CM | POA: Diagnosis not present

## 2019-03-17 DIAGNOSIS — I120 Hypertensive chronic kidney disease with stage 5 chronic kidney disease or end stage renal disease: Secondary | ICD-10-CM | POA: Diagnosis not present

## 2019-03-17 DIAGNOSIS — N2581 Secondary hyperparathyroidism of renal origin: Secondary | ICD-10-CM | POA: Diagnosis not present

## 2019-03-19 DIAGNOSIS — N186 End stage renal disease: Secondary | ICD-10-CM | POA: Diagnosis not present

## 2019-03-19 DIAGNOSIS — N2581 Secondary hyperparathyroidism of renal origin: Secondary | ICD-10-CM | POA: Diagnosis not present

## 2019-03-19 DIAGNOSIS — D631 Anemia in chronic kidney disease: Secondary | ICD-10-CM | POA: Diagnosis not present

## 2019-03-19 DIAGNOSIS — D509 Iron deficiency anemia, unspecified: Secondary | ICD-10-CM | POA: Diagnosis not present

## 2019-03-22 DIAGNOSIS — N186 End stage renal disease: Secondary | ICD-10-CM | POA: Diagnosis not present

## 2019-03-22 DIAGNOSIS — D631 Anemia in chronic kidney disease: Secondary | ICD-10-CM | POA: Diagnosis not present

## 2019-03-22 DIAGNOSIS — D509 Iron deficiency anemia, unspecified: Secondary | ICD-10-CM | POA: Diagnosis not present

## 2019-03-22 DIAGNOSIS — N2581 Secondary hyperparathyroidism of renal origin: Secondary | ICD-10-CM | POA: Diagnosis not present

## 2019-03-25 ENCOUNTER — Other Ambulatory Visit: Payer: Self-pay | Admitting: *Deleted

## 2019-03-25 NOTE — Patient Outreach (Signed)
[facility]) Care Management  03/25/2019  DARRYLE DENNIE [birth year] [id number]   Telephone screening call attempted for Tier 5 level of care No answer to phone number listed, RNCM was able to leave a HIPAA compliant voicemail with RNCM contact and requested call back. Will await call back. Royetta Crochet. Laymond Purser, MSN, RN, [employer] , [place] [phone]) Business Cell  [phone]) Toll Free Office

## 2019-03-26 DIAGNOSIS — D631 Anemia in chronic kidney disease: Secondary | ICD-10-CM | POA: Diagnosis not present

## 2019-03-26 DIAGNOSIS — N186 End stage renal disease: Secondary | ICD-10-CM | POA: Diagnosis not present

## 2019-03-26 DIAGNOSIS — N2581 Secondary hyperparathyroidism of renal origin: Secondary | ICD-10-CM | POA: Diagnosis not present

## 2019-03-26 DIAGNOSIS — D509 Iron deficiency anemia, unspecified: Secondary | ICD-10-CM | POA: Diagnosis not present

## 2019-03-29 DIAGNOSIS — N2581 Secondary hyperparathyroidism of renal origin: Secondary | ICD-10-CM | POA: Diagnosis not present

## 2019-03-29 DIAGNOSIS — N186 End stage renal disease: Secondary | ICD-10-CM | POA: Diagnosis not present

## 2019-03-29 DIAGNOSIS — D631 Anemia in chronic kidney disease: Secondary | ICD-10-CM | POA: Diagnosis not present

## 2019-03-29 DIAGNOSIS — D509 Iron deficiency anemia, unspecified: Secondary | ICD-10-CM | POA: Diagnosis not present

## 2019-03-30 ENCOUNTER — Other Ambulatory Visit: Payer: Self-pay | Admitting: *Deleted

## 2019-03-30 ENCOUNTER — Encounter: Payer: Self-pay | Admitting: *Deleted
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
## 2019-03-31 ENCOUNTER — Other Ambulatory Visit: Payer: Self-pay | Admitting: Pharmacist

## 2019-03-31 DIAGNOSIS — D631 Anemia in chronic kidney disease: Secondary | ICD-10-CM | POA: Diagnosis not present

## 2019-03-31 DIAGNOSIS — N186 End stage renal disease: Secondary | ICD-10-CM | POA: Diagnosis not present

## 2019-03-31 DIAGNOSIS — N2581 Secondary hyperparathyroidism of renal origin: Secondary | ICD-10-CM | POA: Diagnosis not present

## 2019-03-31 DIAGNOSIS — D509 Iron deficiency anemia, unspecified: Secondary | ICD-10-CM | POA: Diagnosis not present

## 2019-03-31 NOTE — Patient Outreach (Signed)
[facility]) Care Management  [place]   03/31/2019  Brian [name] January 22, 1976 [id number]  Reason for referral: Medication Review  Referral source: [facility] RN Current insurance: Medicare A/B + Medicaid  PMHx includes but not limited to:  ESRD on HD MWF, tobacco abuse, hx DVT/PE on chronic anticoagulation, chronic back pain, COPD, depression, GERD, HIV, HTN, neuropathy, ELS, thoracic aortic aneurysm   Outreach:  Successful telephone call with Brian [name].  HIPAA identifiers verified.   Subjective:  Patient reports he is currently in HD but agreeable to review medications.  He states he has no issues with cost of medications or with transportation.  He states his only issue is that his pain medication dose has decreased recently because he was started on a benzodiazepine for sleep.  He voices frustration and states he may need to change PCPs or go to a pain specialist to have opiates increased.   Objective: Lab Results  Component Value Date   CREATININE 7.25 (H) 02/25/2019   CREATININE 7.86 (H) 02/06/2019   CREATININE 9.81 (H) 01/29/2019    Lab Results  Component Value Date   HGBA1C 5.8 (H) 08/09/2016    Lipid Panel     Component Value Date/Time   CHOL 136 03/10/2018 1044   TRIG 193 (H) 03/10/2018 1044   HDL 40 (L) 03/10/2018 1044   CHOLHDL 3.4 03/10/2018 1044   VLDL 21 12/22/2014 1526   LDLCALC 70 03/10/2018 1044    BP Readings from Last 3 Encounters:  03/12/19 124/79  02/25/19 124/76  02/06/19 114/72    Allergies  Allergen Reactions  . Cat Hair Extract Other (See Comments)    SNEEZING    Medications Reviewed Today    Reviewed by Nickel, Sharmon Leyden, NP (Nurse Practitioner) on 03/12/19 at 1544  Med List Status: <None>  Medication Order Taking? Sig Documenting Provider Last Dose Status Informant  albuterol (PROAIR HFA) 108 (90 Base) MCG/ACT inhaler [id number] Yes Inhale 2 puffs into the lungs every 6 (six) hours as needed for wheezing or  shortness of breath. Ledell Noss, MD Taking Active Self  DESCOVY 200-25 MG tablet [id number] Yes TAKE 1 TABLET BY MOUTH DAILY  Patient taking differently:  Take 1 tablet by mouth daily at 12 noon.    Campbell Riches, MD Taking Active Self  diazepam (VALIUM) 5 MG tablet [id number] Yes Take 5 mg by mouth at bedtime.  [provider] Taking Active Self           Med Note Quinn Axe Jan 24, 2019  5:18 PM) #30/30 days filled 12/21/18 [facility] per PMP AWARE  dronabinol (MARINOL) 5 MG capsule [id number] Yes TAKE ONE CAPSULE BY MOUTH DAILY BEFORE LUNCH Ledell Noss, MD Taking Active   ferric citrate (AURYXIA) 1 GM 210 MG(Fe) tablet [id number] Yes Take 210-420 mg by mouth See admin instructions. Take 2 tablets (420 mg) by mouth with meals and 1 tablet (210 mg) with snacks. [provider] Taking Active Self  fluticasone (FLONASE) 50 MCG/ACT nasal spray [id number] Yes USE 2 SPRAYS IN EACH NOSTRIL DAILY  Patient taking differently:  Place 2 sprays into both nostrils daily as needed for allergies.    Ledell Noss, MD Taking Active Self  lactulose, encephalopathy, (GENERLAC) 10 GM/15ML SOLN [id number] Yes Take 20 g by mouth daily as needed (constipation). [provider] Taking Active Self  oxyCODONE-acetaminophen (PERCOCET) 10-325 MG tablet [id number] Yes Take 0.5 tablets by mouth every 8 (eight) hours as needed for pain.  Ledell Noss, MD Taking Active   oxyCODONE-acetaminophen [facility]) 10-325 MG tablet [id number] Yes Take 0.5 tablets by mouth every 8 (eight) hours as needed for pain. Ledell Noss, MD Taking Active   rOPINIRole (REQUIP) 0.25 MG tablet [id number] Yes Take 0.25 mg by mouth at bedtime.  [provider] Taking Active Self  TIVICAY 50 MG tablet [id number] Yes TAKE 1 TABLET BY MOUTH DAILY  Patient taking differently:  Take 50 mg by mouth daily at 12 noon.    Campbell Riches, MD Taking Active Self  warfarin (COUMADIN) 5 MG tablet [id number] Yes Take 1.5 tablets  (7.5 mg total) by mouth daily. Oda Kilts, MD Taking Active   Med List Note Payton Doughty, CPhT 01/24/19 1705): Dialysis Monday, Wednesday, Friday - [address] [place], [place]          Assessment:  Drugs sorted by system:  Neurologic/Psychologic:diazepam, ropinirole  Hematologic: warfarin  Pulmonary/Allergy:albuterol inhaler, fluticasone NS  Gastrointestinal:lactulose  Renal:ferric citrate  Pain:dronabinol, oxycodone-acetaminophen  Infectious Diseases:descovy, ticivay  Medication Review Findings:  . No medication issues identified. Encouraged patient to reach out to PCP regarding pain and to discuss referral to pain specialist if needed.   Medication Assistance Findings:  No medication assistance needs identified   Plan: . Will close [facility] pharmacy case as no further medication needs identified at this time.  Am happy to assist in the future as needed.     Ralene Bathe, PharmD, [place] [phone]

## 2019-04-02 DIAGNOSIS — D631 Anemia in chronic kidney disease: Secondary | ICD-10-CM | POA: Diagnosis not present

## 2019-04-02 DIAGNOSIS — N186 End stage renal disease: Secondary | ICD-10-CM | POA: Diagnosis not present

## 2019-04-02 DIAGNOSIS — D509 Iron deficiency anemia, unspecified: Secondary | ICD-10-CM | POA: Diagnosis not present

## 2019-04-02 DIAGNOSIS — N2581 Secondary hyperparathyroidism of renal origin: Secondary | ICD-10-CM | POA: Diagnosis not present

## 2019-04-05 DIAGNOSIS — N186 End stage renal disease: Secondary | ICD-10-CM | POA: Diagnosis not present

## 2019-04-05 DIAGNOSIS — D509 Iron deficiency anemia, unspecified: Secondary | ICD-10-CM | POA: Diagnosis not present

## 2019-04-05 DIAGNOSIS — N2581 Secondary hyperparathyroidism of renal origin: Secondary | ICD-10-CM | POA: Diagnosis not present

## 2019-04-05 DIAGNOSIS — D631 Anemia in chronic kidney disease: Secondary | ICD-10-CM | POA: Diagnosis not present

## 2019-04-07 DIAGNOSIS — D509 Iron deficiency anemia, unspecified: Secondary | ICD-10-CM | POA: Diagnosis not present

## 2019-04-07 DIAGNOSIS — N2581 Secondary hyperparathyroidism of renal origin: Secondary | ICD-10-CM | POA: Diagnosis not present

## 2019-04-07 DIAGNOSIS — D631 Anemia in chronic kidney disease: Secondary | ICD-10-CM | POA: Diagnosis not present

## 2019-04-07 DIAGNOSIS — N186 End stage renal disease: Secondary | ICD-10-CM | POA: Diagnosis not present

## 2019-04-08 NOTE — Telephone Encounter (Signed)
review 

## 2019-04-09 ENCOUNTER — Ambulatory Visit (HOSPITAL_COMMUNITY): Admission: RE | Admit: 2019-04-09 | Payer: Medicare Other | Source: Ambulatory Visit

## 2019-04-09 DIAGNOSIS — D631 Anemia in chronic kidney disease: Secondary | ICD-10-CM | POA: Diagnosis not present

## 2019-04-09 DIAGNOSIS — N2581 Secondary hyperparathyroidism of renal origin: Secondary | ICD-10-CM | POA: Diagnosis not present

## 2019-04-09 DIAGNOSIS — N186 End stage renal disease: Secondary | ICD-10-CM | POA: Diagnosis not present

## 2019-04-09 DIAGNOSIS — D509 Iron deficiency anemia, unspecified: Secondary | ICD-10-CM | POA: Diagnosis not present

## 2019-04-12 DIAGNOSIS — D631 Anemia in chronic kidney disease: Secondary | ICD-10-CM | POA: Diagnosis not present

## 2019-04-12 DIAGNOSIS — N2581 Secondary hyperparathyroidism of renal origin: Secondary | ICD-10-CM | POA: Diagnosis not present

## 2019-04-12 DIAGNOSIS — D509 Iron deficiency anemia, unspecified: Secondary | ICD-10-CM | POA: Diagnosis not present

## 2019-04-12 DIAGNOSIS — N186 End stage renal disease: Secondary | ICD-10-CM | POA: Diagnosis not present

## 2019-04-13 DIAGNOSIS — I2782 Chronic pulmonary embolism: Secondary | ICD-10-CM | POA: Diagnosis not present

## 2019-04-13 DIAGNOSIS — Z7901 Long term (current) use of anticoagulants: Secondary | ICD-10-CM | POA: Diagnosis not present

## 2019-04-13 DIAGNOSIS — Z86718 Personal history of other venous thrombosis and embolism: Secondary | ICD-10-CM | POA: Diagnosis not present

## 2019-04-13 LAB — POCT INR: INR: 2.6 (ref 2.0–3.0)

## 2019-04-14 ENCOUNTER — Ambulatory Visit: Payer: Medicare Other | Admitting: Internal Medicine

## 2019-04-14 DIAGNOSIS — D631 Anemia in chronic kidney disease: Secondary | ICD-10-CM | POA: Diagnosis not present

## 2019-04-14 DIAGNOSIS — D509 Iron deficiency anemia, unspecified: Secondary | ICD-10-CM | POA: Diagnosis not present

## 2019-04-14 DIAGNOSIS — N2581 Secondary hyperparathyroidism of renal origin: Secondary | ICD-10-CM | POA: Diagnosis not present

## 2019-04-14 DIAGNOSIS — N186 End stage renal disease: Secondary | ICD-10-CM | POA: Diagnosis not present

## 2019-04-16 ENCOUNTER — Telehealth (INDEPENDENT_AMBULATORY_CARE_PROVIDER_SITE_OTHER): Payer: Medicare Other | Admitting: Pharmacist

## 2019-04-16 DIAGNOSIS — Z86718 Personal history of other venous thrombosis and embolism: Secondary | ICD-10-CM

## 2019-04-16 DIAGNOSIS — Z86711 Personal history of pulmonary embolism: Secondary | ICD-10-CM

## 2019-04-16 DIAGNOSIS — Z7901 Long term (current) use of anticoagulants: Secondary | ICD-10-CM

## 2019-04-16 DIAGNOSIS — Z5181 Encounter for therapeutic drug level monitoring: Secondary | ICD-10-CM | POA: Diagnosis not present

## 2019-04-16 DIAGNOSIS — N186 End stage renal disease: Secondary | ICD-10-CM | POA: Diagnosis not present

## 2019-04-16 DIAGNOSIS — N2581 Secondary hyperparathyroidism of renal origin: Secondary | ICD-10-CM | POA: Diagnosis not present

## 2019-04-16 DIAGNOSIS — Z992 Dependence on renal dialysis: Secondary | ICD-10-CM | POA: Diagnosis not present

## 2019-04-16 DIAGNOSIS — D509 Iron deficiency anemia, unspecified: Secondary | ICD-10-CM | POA: Diagnosis not present

## 2019-04-16 DIAGNOSIS — I120 Hypertensive chronic kidney disease with stage 5 chronic kidney disease or end stage renal disease: Secondary | ICD-10-CM | POA: Diagnosis not present

## 2019-04-16 DIAGNOSIS — D631 Anemia in chronic kidney disease: Secondary | ICD-10-CM | POA: Diagnosis not present
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
## 2019-04-19 ENCOUNTER — Other Ambulatory Visit: Payer: Self-pay | Admitting: *Deleted

## 2019-04-19 DIAGNOSIS — Z86711 Personal history of pulmonary embolism: Secondary | ICD-10-CM

## 2019-04-19 DIAGNOSIS — Z79891 Long term (current) use of opiate analgesic: Secondary | ICD-10-CM

## 2019-04-19 DIAGNOSIS — D631 Anemia in chronic kidney disease: Secondary | ICD-10-CM | POA: Diagnosis not present

## 2019-04-19 DIAGNOSIS — N186 End stage renal disease: Secondary | ICD-10-CM | POA: Diagnosis not present

## 2019-04-19 DIAGNOSIS — D509 Iron deficiency anemia, unspecified: Secondary | ICD-10-CM | POA: Diagnosis not present

## 2019-04-19 DIAGNOSIS — N2581 Secondary hyperparathyroidism of renal origin: Secondary | ICD-10-CM | POA: Diagnosis not present

## 2019-04-21 DIAGNOSIS — N186 End stage renal disease: Secondary | ICD-10-CM | POA: Diagnosis not present

## 2019-04-21 DIAGNOSIS — D631 Anemia in chronic kidney disease: Secondary | ICD-10-CM | POA: Diagnosis not present

## 2019-04-21 DIAGNOSIS — D509 Iron deficiency anemia, unspecified: Secondary | ICD-10-CM | POA: Diagnosis not present

## 2019-04-21 DIAGNOSIS — N2581 Secondary hyperparathyroidism of renal origin: Secondary | ICD-10-CM | POA: Diagnosis not present

## 2019-04-22 ENCOUNTER — Telehealth: Payer: Self-pay | Admitting: Pharmacist

## 2019-04-22 NOTE — Telephone Encounter (Signed)
Called patient to discuss his PST FS POC INR result he collected on 21-Apr-2019 and made available to me on 22-Apr-2019. INR 2.6 (same as 1 week ago) on 52.5mg /wk (1&1/2 x 5mg  PO QD). Advised continue same regimen. Re-collect in 1 week.

## 2019-04-22 NOTE — Telephone Encounter (Signed)
Patient was called after 12:00PM as per his request. Discussed PST FS INR results of 2.6 on 52.5mg /wk warfarin as 1&1/2 x 5mg  (7.5mg  daily dose). Told to continue this regimen. He is authorized to perform PST every week. Advised to re-collect next week as permitted by his insurance.

## 2019-04-23 DIAGNOSIS — N186 End stage renal disease: Secondary | ICD-10-CM | POA: Diagnosis not present

## 2019-04-23 DIAGNOSIS — D631 Anemia in chronic kidney disease: Secondary | ICD-10-CM | POA: Diagnosis not present

## 2019-04-23 DIAGNOSIS — D509 Iron deficiency anemia, unspecified: Secondary | ICD-10-CM | POA: Diagnosis not present

## 2019-04-23 DIAGNOSIS — N2581 Secondary hyperparathyroidism of renal origin: Secondary | ICD-10-CM | POA: Diagnosis not present

## 2019-04-26 ENCOUNTER — Other Ambulatory Visit: Payer: Self-pay | Admitting: Infectious Diseases

## 2019-04-26 DIAGNOSIS — B20 Human immunodeficiency virus [HIV] disease: Secondary | ICD-10-CM

## 2019-04-26 DIAGNOSIS — N186 End stage renal disease: Secondary | ICD-10-CM | POA: Diagnosis not present

## 2019-04-26 DIAGNOSIS — D509 Iron deficiency anemia, unspecified: Secondary | ICD-10-CM | POA: Diagnosis not present

## 2019-04-26 DIAGNOSIS — N2581 Secondary hyperparathyroidism of renal origin: Secondary | ICD-10-CM | POA: Diagnosis not present

## 2019-04-26 DIAGNOSIS — D631 Anemia in chronic kidney disease: Secondary | ICD-10-CM | POA: Diagnosis not present

## 2019-04-28 ENCOUNTER — Other Ambulatory Visit: Payer: Self-pay | Admitting: Infectious Diseases

## 2019-04-28 DIAGNOSIS — N2581 Secondary hyperparathyroidism of renal origin: Secondary | ICD-10-CM | POA: Diagnosis not present

## 2019-04-28 DIAGNOSIS — N186 End stage renal disease: Secondary | ICD-10-CM | POA: Diagnosis not present

## 2019-04-28 DIAGNOSIS — D631 Anemia in chronic kidney disease: Secondary | ICD-10-CM | POA: Diagnosis not present

## 2019-04-28 DIAGNOSIS — D509 Iron deficiency anemia, unspecified: Secondary | ICD-10-CM | POA: Diagnosis not present

## 2019-04-28 DIAGNOSIS — B20 Human immunodeficiency virus [HIV] disease: Secondary | ICD-10-CM

## 2019-04-30 ENCOUNTER — Other Ambulatory Visit: Payer: Self-pay | Admitting: Internal Medicine

## 2019-04-30 DIAGNOSIS — N2581 Secondary hyperparathyroidism of renal origin: Secondary | ICD-10-CM | POA: Diagnosis not present

## 2019-04-30 DIAGNOSIS — N186 End stage renal disease: Secondary | ICD-10-CM | POA: Diagnosis not present

## 2019-04-30 DIAGNOSIS — D509 Iron deficiency anemia, unspecified: Secondary | ICD-10-CM | POA: Diagnosis not present

## 2019-04-30 DIAGNOSIS — D631 Anemia in chronic kidney disease: Secondary | ICD-10-CM | POA: Diagnosis not present

## 2019-05-03 DIAGNOSIS — D631 Anemia in chronic kidney disease: Secondary | ICD-10-CM | POA: Diagnosis not present

## 2019-05-03 DIAGNOSIS — D509 Iron deficiency anemia, unspecified: Secondary | ICD-10-CM | POA: Diagnosis not present

## 2019-05-03 DIAGNOSIS — N2581 Secondary hyperparathyroidism of renal origin: Secondary | ICD-10-CM | POA: Diagnosis not present

## 2019-05-03 DIAGNOSIS — N186 End stage renal disease: Secondary | ICD-10-CM | POA: Diagnosis not present

## 2019-05-05 DIAGNOSIS — N186 End stage renal disease: Secondary | ICD-10-CM | POA: Diagnosis not present

## 2019-05-05 DIAGNOSIS — N2581 Secondary hyperparathyroidism of renal origin: Secondary | ICD-10-CM | POA: Diagnosis not present

## 2019-05-05 DIAGNOSIS — D631 Anemia in chronic kidney disease: Secondary | ICD-10-CM | POA: Diagnosis not present

## 2019-05-05 DIAGNOSIS — D509 Iron deficiency anemia, unspecified: Secondary | ICD-10-CM | POA: Diagnosis not present

## 2019-05-06 DIAGNOSIS — I2782 Chronic pulmonary embolism: Secondary | ICD-10-CM | POA: Diagnosis not present

## 2019-05-06 DIAGNOSIS — Z86718 Personal history of other venous thrombosis and embolism: Secondary | ICD-10-CM | POA: Diagnosis not present

## 2019-05-06 DIAGNOSIS — Z7901 Long term (current) use of anticoagulants: Secondary | ICD-10-CM | POA: Diagnosis not present

## 2019-05-07 ENCOUNTER — Telehealth: Payer: Self-pay | Admitting: Pharmacist

## 2019-05-07 DIAGNOSIS — D509 Iron deficiency anemia, unspecified: Secondary | ICD-10-CM | POA: Diagnosis not present

## 2019-05-07 DIAGNOSIS — N2581 Secondary hyperparathyroidism of renal origin: Secondary | ICD-10-CM | POA: Diagnosis not present

## 2019-05-07 DIAGNOSIS — D631 Anemia in chronic kidney disease: Secondary | ICD-10-CM | POA: Diagnosis not present

## 2019-05-07 DIAGNOSIS — N186 End stage renal disease: Secondary | ICD-10-CM | POA: Diagnosis not present

## 2019-05-07 NOTE — Telephone Encounter (Signed)
Spoke with patient. INR - 2.2 on 52.5mg  warfarin/wk. Will continue this regimen. States he will self-test next Wednesday 12-May-2019. Denies any signs/symptoms of bleeding. Has adequate supply of warfarin.

## 2019-05-10 DIAGNOSIS — N2581 Secondary hyperparathyroidism of renal origin: Secondary | ICD-10-CM | POA: Diagnosis not present

## 2019-05-10 DIAGNOSIS — D509 Iron deficiency anemia, unspecified: Secondary | ICD-10-CM | POA: Diagnosis not present

## 2019-05-10 DIAGNOSIS — D631 Anemia in chronic kidney disease: Secondary | ICD-10-CM | POA: Diagnosis not present

## 2019-05-10 DIAGNOSIS — N186 End stage renal disease: Secondary | ICD-10-CM | POA: Diagnosis not present

## 2019-05-12 DIAGNOSIS — D631 Anemia in chronic kidney disease: Secondary | ICD-10-CM | POA: Diagnosis not present

## 2019-05-12 DIAGNOSIS — N186 End stage renal disease: Secondary | ICD-10-CM | POA: Diagnosis not present

## 2019-05-12 DIAGNOSIS — D509 Iron deficiency anemia, unspecified: Secondary | ICD-10-CM | POA: Diagnosis not present

## 2019-05-12 DIAGNOSIS — N2581 Secondary hyperparathyroidism of renal origin: Secondary | ICD-10-CM | POA: Diagnosis not present

## 2019-05-13 ENCOUNTER — Other Ambulatory Visit: Payer: Self-pay

## 2019-05-13 MED ORDER — OXYCODONE-ACETAMINOPHEN 10-325 MG PO TABS: 0.5000 | ORAL_TABLET | Freq: Three times a day (TID) | ORAL | 0 refills | Status: DC | PRN

## 2019-05-13 NOTE — Telephone Encounter (Signed)
oxyCODONE-acetaminophen (PERCOCET) 10-325 MG tablet, refill request @  [facility] [place], [place] [phone] (Phone) [phone] (Fax)

## 2019-05-13 NOTE — Telephone Encounter (Signed)
Patient is over due for continuity follow up. I will complete a one time refill. Please schedule him for continuity. Thank you!

## 2019-05-13 NOTE — Telephone Encounter (Signed)
Please call pt back regarding oxycodone.

## 2019-05-14 DIAGNOSIS — N2581 Secondary hyperparathyroidism of renal origin: Secondary | ICD-10-CM | POA: Diagnosis not present

## 2019-05-14 DIAGNOSIS — D509 Iron deficiency anemia, unspecified: Secondary | ICD-10-CM | POA: Diagnosis not present

## 2019-05-14 DIAGNOSIS — D631 Anemia in chronic kidney disease: Secondary | ICD-10-CM | POA: Diagnosis not present

## 2019-05-14 DIAGNOSIS — N186 End stage renal disease: Secondary | ICD-10-CM | POA: Diagnosis not present

## 2019-05-14 NOTE — Telephone Encounter (Signed)
Do you want Brian [name] to do telehealth appt or do you want the patient to come in.

## 2019-05-14 NOTE — Telephone Encounter (Signed)
Telehealth would be fine for the visit

## 2019-05-17 DIAGNOSIS — N2581 Secondary hyperparathyroidism of renal origin: Secondary | ICD-10-CM | POA: Diagnosis not present

## 2019-05-17 DIAGNOSIS — N186 End stage renal disease: Secondary | ICD-10-CM | POA: Diagnosis not present

## 2019-05-17 DIAGNOSIS — D631 Anemia in chronic kidney disease: Secondary | ICD-10-CM | POA: Diagnosis not present

## 2019-05-17 DIAGNOSIS — I120 Hypertensive chronic kidney disease with stage 5 chronic kidney disease or end stage renal disease: Secondary | ICD-10-CM | POA: Diagnosis not present

## 2019-05-17 DIAGNOSIS — Z992 Dependence on renal dialysis: Secondary | ICD-10-CM | POA: Diagnosis not present

## 2019-05-18 ENCOUNTER — Other Ambulatory Visit: Payer: Self-pay

## 2019-05-18 ENCOUNTER — Ambulatory Visit: Payer: Medicare Other | Admitting: Internal Medicine

## 2019-05-18 DIAGNOSIS — Z452 Encounter for adjustment and management of vascular access device: Secondary | ICD-10-CM | POA: Diagnosis not present

## 2019-05-18 DIAGNOSIS — Z79891 Long term (current) use of opiate analgesic: Secondary | ICD-10-CM
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
## 2019-05-19 DIAGNOSIS — D631 Anemia in chronic kidney disease: Secondary | ICD-10-CM | POA: Diagnosis not present

## 2019-05-19 DIAGNOSIS — N2581 Secondary hyperparathyroidism of renal origin: Secondary | ICD-10-CM | POA: Diagnosis not present

## 2019-05-19 DIAGNOSIS — N186 End stage renal disease: Secondary | ICD-10-CM | POA: Diagnosis not present

## 2019-05-20 ENCOUNTER — Encounter: Payer: Self-pay | Admitting: Internal Medicine

## 2019-05-20 ENCOUNTER — Ambulatory Visit (INDEPENDENT_AMBULATORY_CARE_PROVIDER_SITE_OTHER): Payer: Medicare Other | Admitting: Internal Medicine

## 2019-05-20 ENCOUNTER — Other Ambulatory Visit: Payer: Self-pay

## 2019-05-20 DIAGNOSIS — Z7901 Long term (current) use of anticoagulants: Secondary | ICD-10-CM | POA: Diagnosis not present

## 2019-05-20 DIAGNOSIS — G8929 Other chronic pain: Secondary | ICD-10-CM | POA: Diagnosis not present

## 2019-05-20 DIAGNOSIS — Z86718 Personal history of other venous thrombosis and embolism: Secondary | ICD-10-CM

## 2019-05-20 DIAGNOSIS — Z79891 Long term (current) use of opiate analgesic: Secondary | ICD-10-CM | POA: Diagnosis not present

## 2019-05-20 DIAGNOSIS — F17210 Nicotine dependence, cigarettes, uncomplicated: Secondary | ICD-10-CM

## 2019-05-20 DIAGNOSIS — Z86711 Personal history of pulmonary embolism: Secondary | ICD-10-CM

## 2019-05-20 NOTE — Assessment & Plan Note (Signed)
[name] is on chronic opioid therapy for chronic pain. The date of the controlled substances contract is referenced in the [place] and / or the overview. Date of pain contract was 07/2017. As part of the treatment plan, the [place] controlled substance database is checked at least twice yearly and the database results are not appropriate. I have last reviewed the results on 05/20/2019. Mr. Spieker continues to fill benzodiazepines monthly from outside provider.    The last UDS was on 02/2019 and results are as expected. Patient needs at least a yearly UDS.   The patient is on oxycodone/acetaminophen (Percocet, Tylox) strength 10, 60 per 30 days. Adjunctive treatment includes none, patient declines . This regimen allows ISAMI MEHRA to function and does not cause excessive sedation or other side effects.  "The benefits of continuing opioid therapy outweigh the risks and chronic opioids will be continued. Ongoing education about safe opioid treatment is provided  Interventions today include: Discussed ongoing taper with Mr. Applegate, he feels that any further taper would result in his pain becoming unbearable and is planning to stop refilling diazepam because "it doesn't help with sleep the way that it used to". I will continue the pain medications at the current dose and have asked him to follow up in 3 months for reevaluation.  Refills - 3 paper Rx sent electronically

## 2019-05-20 NOTE — Progress Notes (Signed)
Brian [name] had a telehealth continuity clinic visit scheduled for this afternoon. I attempted to reach him but the phone went to voicemail. Will attempt to reach him again later this week.
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
## 2019-05-20 NOTE — Assessment & Plan Note (Signed)
Patient with history of multiple unprovoked VTE. Currently he is on treatment with coumadin and monitors his INR with home testing and communication with our pharmacist team. He was doing well with monitoring of the INR up until 2 weeks ago when he ran out of strips for his home monitor, since that time he has not yet been able to obtain the correct strips. He denies any signs or symptoms of bleeding. INR goal is 2-3, current coumadin dose is 7.5 mg per day. We discussed the risk of missed monitoring of the coumadin especially with his history of intracranial bleeding, he is understanding of the risk but then explains that he has been considering changing the monitoring every two weeks instead. I ask that Brian [name] continue to monitor weekly and participate in shared discussion with our team and keep [place] informed before making any changes to his INR monitoring.

## 2019-05-21 ENCOUNTER — Ambulatory Visit (HOSPITAL_COMMUNITY): Admission: RE | Admit: 2019-05-21 | Payer: Medicare Other | Source: Ambulatory Visit

## 2019-05-21 DIAGNOSIS — N2581 Secondary hyperparathyroidism of renal origin: Secondary | ICD-10-CM | POA: Diagnosis not present

## 2019-05-21 DIAGNOSIS — D631 Anemia in chronic kidney disease: Secondary | ICD-10-CM | POA: Diagnosis not present

## 2019-05-21 DIAGNOSIS — N186 End stage renal disease: Secondary | ICD-10-CM | POA: Diagnosis not present

## 2019-05-21 NOTE — Progress Notes (Signed)
Internal Medicine Clinic Attending  Case discussed with Dr. Blum at the time of the visit.  We reviewed the resident's history and exam and pertinent patient test results.  I agree with the assessment, diagnosis, and plan of care documented in the resident's note. 

## 2019-05-23 ENCOUNTER — Other Ambulatory Visit: Payer: Self-pay | Admitting: Internal Medicine

## 2019-05-23 DIAGNOSIS — B20 Human immunodeficiency virus [HIV] disease: Secondary | ICD-10-CM

## 2019-05-24 DIAGNOSIS — N186 End stage renal disease: Secondary | ICD-10-CM | POA: Diagnosis not present

## 2019-05-24 DIAGNOSIS — N2581 Secondary hyperparathyroidism of renal origin: Secondary | ICD-10-CM | POA: Diagnosis not present

## 2019-05-24 DIAGNOSIS — D631 Anemia in chronic kidney disease: Secondary | ICD-10-CM | POA: Diagnosis not present

## 2019-05-26 DIAGNOSIS — N2581 Secondary hyperparathyroidism of renal origin: Secondary | ICD-10-CM | POA: Diagnosis not present

## 2019-05-26 DIAGNOSIS — N186 End stage renal disease: Secondary | ICD-10-CM | POA: Diagnosis not present

## 2019-05-26 DIAGNOSIS — D631 Anemia in chronic kidney disease: Secondary | ICD-10-CM | POA: Diagnosis not present

## 2019-05-27 ENCOUNTER — Encounter (HOSPITAL_COMMUNITY): Payer: Medicare Other

## 2019-05-28 DIAGNOSIS — R634 Abnormal weight loss: Secondary | ICD-10-CM | POA: Diagnosis not present

## 2019-05-28 DIAGNOSIS — D631 Anemia in chronic kidney disease: Secondary | ICD-10-CM | POA: Diagnosis not present

## 2019-05-28 DIAGNOSIS — N2581 Secondary hyperparathyroidism of renal origin: Secondary | ICD-10-CM | POA: Diagnosis not present

## 2019-05-28 DIAGNOSIS — N186 End stage renal disease: Secondary | ICD-10-CM | POA: Diagnosis not present

## 2019-05-31 DIAGNOSIS — N2581 Secondary hyperparathyroidism of renal origin: Secondary | ICD-10-CM | POA: Diagnosis not present

## 2019-05-31 DIAGNOSIS — N186 End stage renal disease: Secondary | ICD-10-CM | POA: Diagnosis not present

## 2019-05-31 DIAGNOSIS — D631 Anemia in chronic kidney disease: Secondary | ICD-10-CM | POA: Diagnosis not present

## 2019-06-01 ENCOUNTER — Telehealth: Payer: Self-pay | Admitting: Pharmacist

## 2019-06-01 ENCOUNTER — Telehealth: Payer: Self-pay | Admitting: *Deleted

## 2019-06-01 NOTE — Telephone Encounter (Signed)
Received fax from [place] with INR results from 04/13/2019-06/01/2019. Last INR 2.8 on 06/01/2019. Placed in Dr. Gladstone Pih box. Hubbard Hartshorn, RN, BSN

## 2019-06-01 NOTE — Telephone Encounter (Signed)
Left VM: PST POC FS INR reported to me as 2.8 on 52.5mg  warfarin/wk (as 1&1/2 x 5mg  = 7.5mg /day). Was advised to CONTNUE this regimen. Repeat INR on 14-Jun-2019 and text results to me.

## 2019-06-02 DIAGNOSIS — N186 End stage renal disease: Secondary | ICD-10-CM | POA: Diagnosis not present

## 2019-06-02 DIAGNOSIS — N2581 Secondary hyperparathyroidism of renal origin: Secondary | ICD-10-CM | POA: Diagnosis not present

## 2019-06-02 DIAGNOSIS — D631 Anemia in chronic kidney disease: Secondary | ICD-10-CM | POA: Diagnosis not present

## 2019-06-04 DIAGNOSIS — D631 Anemia in chronic kidney disease: Secondary | ICD-10-CM | POA: Diagnosis not present

## 2019-06-04 DIAGNOSIS — N186 End stage renal disease: Secondary | ICD-10-CM | POA: Diagnosis not present

## 2019-06-04 DIAGNOSIS — N2581 Secondary hyperparathyroidism of renal origin: Secondary | ICD-10-CM | POA: Diagnosis not present

## 2019-06-07 DIAGNOSIS — N186 End stage renal disease: Secondary | ICD-10-CM | POA: Diagnosis not present

## 2019-06-07 DIAGNOSIS — D631 Anemia in chronic kidney disease: Secondary | ICD-10-CM | POA: Diagnosis not present

## 2019-06-07 DIAGNOSIS — N2581 Secondary hyperparathyroidism of renal origin: Secondary | ICD-10-CM | POA: Diagnosis not present

## 2019-06-09 ENCOUNTER — Other Ambulatory Visit: Payer: Self-pay | Admitting: Internal Medicine

## 2019-06-09 ENCOUNTER — Telehealth: Payer: Self-pay | Admitting: Pharmacist

## 2019-06-09 ENCOUNTER — Telehealth: Payer: Self-pay

## 2019-06-09 DIAGNOSIS — Z86711 Personal history of pulmonary embolism: Secondary | ICD-10-CM

## 2019-06-09 DIAGNOSIS — N186 End stage renal disease: Secondary | ICD-10-CM | POA: Diagnosis not present

## 2019-06-09 DIAGNOSIS — N2581 Secondary hyperparathyroidism of renal origin: Secondary | ICD-10-CM | POA: Diagnosis not present

## 2019-06-09 DIAGNOSIS — D631 Anemia in chronic kidney disease: Secondary | ICD-10-CM | POA: Diagnosis not present

## 2019-06-09 NOTE — Telephone Encounter (Signed)
Advised patient I received his PST FS POC INR = 2.3 on 52.5mg  warfarin/wk. CONTINUE same regimen, repeat PST FS POC INR on Monday 21-Jun-2019 and text me results.

## 2019-06-09 NOTE — Telephone Encounter (Signed)
Received fax from [facility] with INR results listed from 04/13/19 - 06/08/19.  Last INR result is 2.3 from 06/08/19.  Will forward to Dr. Elie Confer and place fax copy in Dr. Gladstone Pih box. SChaplin, RN,BSN

## 2019-06-10 ENCOUNTER — Encounter: Payer: Self-pay | Admitting: *Deleted

## 2019-06-12 DIAGNOSIS — N2581 Secondary hyperparathyroidism of renal origin: Secondary | ICD-10-CM | POA: Diagnosis not present

## 2019-06-12 DIAGNOSIS — D631 Anemia in chronic kidney disease: Secondary | ICD-10-CM | POA: Diagnosis not present

## 2019-06-12 DIAGNOSIS — N186 End stage renal disease: Secondary | ICD-10-CM | POA: Diagnosis not present

## 2019-06-14 DIAGNOSIS — N2581 Secondary hyperparathyroidism of renal origin: Secondary | ICD-10-CM | POA: Diagnosis not present

## 2019-06-14 DIAGNOSIS — D631 Anemia in chronic kidney disease: Secondary | ICD-10-CM | POA: Diagnosis not present

## 2019-06-14 DIAGNOSIS — N186 End stage renal disease: Secondary | ICD-10-CM | POA: Diagnosis not present

## 2019-06-16 DIAGNOSIS — N186 End stage renal disease: Secondary | ICD-10-CM | POA: Diagnosis not present

## 2019-06-16 DIAGNOSIS — D631 Anemia in chronic kidney disease: Secondary | ICD-10-CM | POA: Diagnosis not present

## 2019-06-16 DIAGNOSIS — Z86718 Personal history of other venous thrombosis and embolism: Secondary | ICD-10-CM | POA: Diagnosis not present

## 2019-06-16 DIAGNOSIS — I120 Hypertensive chronic kidney disease with stage 5 chronic kidney disease or end stage renal disease: Secondary | ICD-10-CM | POA: Diagnosis not present

## 2019-06-16 DIAGNOSIS — N2581 Secondary hyperparathyroidism of renal origin: Secondary | ICD-10-CM | POA: Diagnosis not present

## 2019-06-16 DIAGNOSIS — Z7901 Long term (current) use of anticoagulants: Secondary | ICD-10-CM | POA: Diagnosis not present

## 2019-06-16 DIAGNOSIS — Z992 Dependence on renal dialysis: Secondary | ICD-10-CM | POA: Diagnosis not present

## 2019-06-16 DIAGNOSIS — I2782 Chronic pulmonary embolism: Secondary | ICD-10-CM | POA: Diagnosis not present

## 2019-06-17 ENCOUNTER — Other Ambulatory Visit: Payer: Self-pay

## 2019-06-17 NOTE — Telephone Encounter (Signed)
Per June 4th note patient had 3 prescriptions sent electronically so he should have them at the pharmacy

## 2019-06-17 NOTE — Telephone Encounter (Signed)
oxyCODONE-acetaminophen (PERCOCET) 10-325 MG tablet, REFILL REQUEST @  [id number] - [place], [place] - [address] [phone] (Phone) [phone] (Fax)

## 2019-06-18 DIAGNOSIS — N2581 Secondary hyperparathyroidism of renal origin: Secondary | ICD-10-CM | POA: Diagnosis not present

## 2019-06-18 DIAGNOSIS — D631 Anemia in chronic kidney disease: Secondary | ICD-10-CM | POA: Diagnosis not present

## 2019-06-18 DIAGNOSIS — N186 End stage renal disease: Secondary | ICD-10-CM | POA: Diagnosis not present

## 2019-06-21 DIAGNOSIS — N2581 Secondary hyperparathyroidism of renal origin: Secondary | ICD-10-CM | POA: Diagnosis not present

## 2019-06-21 DIAGNOSIS — D631 Anemia in chronic kidney disease: Secondary | ICD-10-CM | POA: Diagnosis not present

## 2019-06-21 DIAGNOSIS — N186 End stage renal disease: Secondary | ICD-10-CM | POA: Diagnosis not present

## 2019-06-22 ENCOUNTER — Telehealth: Payer: Self-pay | Admitting: *Deleted

## 2019-06-22 MED ORDER — OXYCODONE-ACETAMINOPHEN 10-325 MG PO TABS: 0.5000 | ORAL_TABLET | Freq: Four times a day (QID) | ORAL | 0 refills | Status: DC | PRN

## 2019-06-22 NOTE — Telephone Encounter (Signed)
Reviewed EMR, no rx sent since may, reviewed database, appropriate. Sent 2 months Rx refill .

## 2019-06-22 NOTE — Telephone Encounter (Signed)
Pt informed

## 2019-06-22 NOTE — Telephone Encounter (Signed)
Pt calls and states he cannot get his oxycodone 10/325 at [facility], he had called for a refill 7/2, dr Dareen Piano noted that dr blum stated in her visit note 6/4 she was sending 3 scripts. It does not show this in med list, I called the pharm the last script they rec'd was 5/28. Pt is upset and wants his pain med, please advise and fill if appropriate.

## 2019-06-25 ENCOUNTER — Telehealth: Payer: Self-pay | Admitting: Pharmacist

## 2019-06-25 DIAGNOSIS — N2581 Secondary hyperparathyroidism of renal origin: Secondary | ICD-10-CM | POA: Diagnosis not present

## 2019-06-25 DIAGNOSIS — D631 Anemia in chronic kidney disease: Secondary | ICD-10-CM | POA: Diagnosis not present

## 2019-06-25 DIAGNOSIS — N186 End stage renal disease: Secondary | ICD-10-CM | POA: Diagnosis not present

## 2019-06-25 NOTE — Telephone Encounter (Signed)
INR collected 16-Jun-2019 Patient Sefl Testing  Finger Stick  Point Of Care INR 2.5 on 52.5mg  warfarin/wk. Received from [place] on 21-Jun-2019 and only made available to me on 25-Jun-2019. Patient advised to CONTINUE same regimen. Recollect PST POC FS INR at next secheduled interval.

## 2019-06-28 ENCOUNTER — Telehealth: Payer: Self-pay | Admitting: Pharmacist

## 2019-06-28 DIAGNOSIS — N186 End stage renal disease: Secondary | ICD-10-CM | POA: Diagnosis not present

## 2019-06-28 DIAGNOSIS — D631 Anemia in chronic kidney disease: Secondary | ICD-10-CM | POA: Diagnosis not present

## 2019-06-28 DIAGNOSIS — N2581 Secondary hyperparathyroidism of renal origin: Secondary | ICD-10-CM | POA: Diagnosis not present

## 2019-06-28 NOTE — Telephone Encounter (Signed)
Received PST FS POC INR performed by patient on 24-Jun-2019 at 1:55PM, TODAY 28-Jun-2019. INR 3.5. Advised patient by messsage:  REDUCE dose to 1x5mg  on M/Th; all other days, 1&1/2 x 5mg  [7.5mg ]. Repeat PST FS POC INR 12-Jul-2019.

## 2019-06-28 NOTE — Telephone Encounter (Signed)
I agree

## 2019-06-30 DIAGNOSIS — N186 End stage renal disease: Secondary | ICD-10-CM | POA: Diagnosis not present

## 2019-06-30 DIAGNOSIS — D631 Anemia in chronic kidney disease: Secondary | ICD-10-CM | POA: Diagnosis not present

## 2019-06-30 DIAGNOSIS — N2581 Secondary hyperparathyroidism of renal origin: Secondary | ICD-10-CM | POA: Diagnosis not present

## 2019-07-02 DIAGNOSIS — D631 Anemia in chronic kidney disease: Secondary | ICD-10-CM | POA: Diagnosis not present

## 2019-07-02 DIAGNOSIS — N2581 Secondary hyperparathyroidism of renal origin: Secondary | ICD-10-CM | POA: Diagnosis not present

## 2019-07-02 DIAGNOSIS — N186 End stage renal disease: Secondary | ICD-10-CM | POA: Diagnosis not present

## 2019-07-05 DIAGNOSIS — N186 End stage renal disease: Secondary | ICD-10-CM | POA: Diagnosis not present

## 2019-07-05 DIAGNOSIS — D631 Anemia in chronic kidney disease: Secondary | ICD-10-CM | POA: Diagnosis not present

## 2019-07-05 DIAGNOSIS — N2581 Secondary hyperparathyroidism of renal origin: Secondary | ICD-10-CM | POA: Diagnosis not present

## 2019-07-07 DIAGNOSIS — N186 End stage renal disease: Secondary | ICD-10-CM | POA: Diagnosis not present

## 2019-07-07 DIAGNOSIS — D631 Anemia in chronic kidney disease: Secondary | ICD-10-CM | POA: Diagnosis not present

## 2019-07-07 DIAGNOSIS — N2581 Secondary hyperparathyroidism of renal origin: Secondary | ICD-10-CM | POA: Diagnosis not present

## 2019-07-09 ENCOUNTER — Telehealth: Payer: Self-pay | Admitting: Pharmacist

## 2019-07-09 DIAGNOSIS — N2581 Secondary hyperparathyroidism of renal origin: Secondary | ICD-10-CM | POA: Diagnosis not present

## 2019-07-09 DIAGNOSIS — N186 End stage renal disease: Secondary | ICD-10-CM | POA: Diagnosis not present

## 2019-07-09 DIAGNOSIS — D631 Anemia in chronic kidney disease: Secondary | ICD-10-CM | POA: Diagnosis not present

## 2019-07-09 NOTE — Telephone Encounter (Signed)
Mailbox is full and cannot accept any messages at this time--recording on patient's phone. We will mail from the [facility] his written instructions which are new. The following instructions will be mailed: Your weekly dosage of warfarin/Coumadin has CHANGED. Starting Friday, July 24th, 2020, begin taking the NEW dosage of warfarin as shown. CONTINUE taking this dosage until your next INR test on Friday, August 7th, 2020. Patient performs patient self-testing, point of care, fingerstick INRs.  New instructions:  Take 1 and 1/2 x 5mg  (7.5mg  dose) all days of week--EXCEPT on [facility], take only one (1) of your 5mg  peach-colored warfarin tablets on Wednesdays. Total summed weekly dose is 50mg /wk. Today's INR 40f 2.1 reflects 47.5mg /wk. This new regimen reflects a 5% INCREASE in total weekly dose.

## 2019-07-13 ENCOUNTER — Other Ambulatory Visit: Payer: Self-pay | Admitting: *Deleted

## 2019-07-13 DIAGNOSIS — N186 End stage renal disease: Secondary | ICD-10-CM | POA: Diagnosis not present

## 2019-07-13 DIAGNOSIS — D631 Anemia in chronic kidney disease: Secondary | ICD-10-CM | POA: Diagnosis not present

## 2019-07-13 DIAGNOSIS — Z86711 Personal history of pulmonary embolism: Secondary | ICD-10-CM

## 2019-07-13 DIAGNOSIS — Z79891 Long term (current) use of opiate analgesic: Secondary | ICD-10-CM

## 2019-07-13 DIAGNOSIS — N2581 Secondary hyperparathyroidism of renal origin: Secondary | ICD-10-CM | POA: Diagnosis not present

## 2019-07-16 DIAGNOSIS — N2581 Secondary hyperparathyroidism of renal origin: Secondary | ICD-10-CM | POA: Diagnosis not present

## 2019-07-16 DIAGNOSIS — D631 Anemia in chronic kidney disease: Secondary | ICD-10-CM | POA: Diagnosis not present

## 2019-07-16 DIAGNOSIS — N186 End stage renal disease: Secondary | ICD-10-CM | POA: Diagnosis not present

## 2019-07-17 DIAGNOSIS — I120 Hypertensive chronic kidney disease with stage 5 chronic kidney disease or end stage renal disease: Secondary | ICD-10-CM | POA: Diagnosis not present

## 2019-07-17 DIAGNOSIS — Z992 Dependence on renal dialysis: Secondary | ICD-10-CM | POA: Diagnosis not present

## 2019-07-17 DIAGNOSIS — N186 End stage renal disease: Secondary | ICD-10-CM | POA: Diagnosis not present

## 2019-07-19 DIAGNOSIS — N2581 Secondary hyperparathyroidism of renal origin: Secondary | ICD-10-CM | POA: Diagnosis not present

## 2019-07-19 DIAGNOSIS — D631 Anemia in chronic kidney disease: Secondary | ICD-10-CM | POA: Diagnosis not present

## 2019-07-19 DIAGNOSIS — N186 End stage renal disease: Secondary | ICD-10-CM | POA: Diagnosis not present

## 2019-07-19 DIAGNOSIS — Z992 Dependence on renal dialysis: Secondary | ICD-10-CM | POA: Diagnosis not present

## 2019-07-21 ENCOUNTER — Telehealth: Payer: Self-pay | Admitting: Pharmacist

## 2019-07-21 DIAGNOSIS — D631 Anemia in chronic kidney disease: Secondary | ICD-10-CM | POA: Diagnosis not present

## 2019-07-21 DIAGNOSIS — N2581 Secondary hyperparathyroidism of renal origin: Secondary | ICD-10-CM | POA: Diagnosis not present

## 2019-07-21 DIAGNOSIS — Z992 Dependence on renal dialysis: Secondary | ICD-10-CM | POA: Diagnosis not present

## 2019-07-21 DIAGNOSIS — N186 End stage renal disease: Secondary | ICD-10-CM | POA: Diagnosis not present

## 2019-07-21 NOTE — Telephone Encounter (Signed)
Was called at 8:54PM tonight by patient, reporting his PST FS POC INR results of 2.3 (goal 2.0 - 3.0) on 50mg  warfarin/wk (1&1/2x 5mg  daily, except on Wednesdays, 5mg  only). No bleeding, no new medications, no missed doses. No symptoms of embolic events. Adequate supply of warfarin remains on-hand. Patient states he performs PST FS POC INR determinations weekly. Advised to repeat in one week.

## 2019-07-23 DIAGNOSIS — N186 End stage renal disease: Secondary | ICD-10-CM | POA: Diagnosis not present

## 2019-07-23 DIAGNOSIS — N2581 Secondary hyperparathyroidism of renal origin: Secondary | ICD-10-CM | POA: Diagnosis not present

## 2019-07-23 DIAGNOSIS — D631 Anemia in chronic kidney disease: Secondary | ICD-10-CM | POA: Diagnosis not present

## 2019-07-23 DIAGNOSIS — Z992 Dependence on renal dialysis: Secondary | ICD-10-CM | POA: Diagnosis not present

## 2019-07-26 DIAGNOSIS — N2581 Secondary hyperparathyroidism of renal origin: Secondary | ICD-10-CM | POA: Diagnosis not present

## 2019-07-26 DIAGNOSIS — Z992 Dependence on renal dialysis: Secondary | ICD-10-CM | POA: Diagnosis not present

## 2019-07-26 DIAGNOSIS — D631 Anemia in chronic kidney disease: Secondary | ICD-10-CM | POA: Diagnosis not present

## 2019-07-26 DIAGNOSIS — N186 End stage renal disease: Secondary | ICD-10-CM | POA: Diagnosis not present

## 2019-07-28 DIAGNOSIS — Z992 Dependence on renal dialysis: Secondary | ICD-10-CM | POA: Diagnosis not present

## 2019-07-28 DIAGNOSIS — N186 End stage renal disease: Secondary | ICD-10-CM | POA: Diagnosis not present

## 2019-07-28 DIAGNOSIS — D631 Anemia in chronic kidney disease: Secondary | ICD-10-CM | POA: Diagnosis not present

## 2019-07-28 DIAGNOSIS — N2581 Secondary hyperparathyroidism of renal origin: Secondary | ICD-10-CM | POA: Diagnosis not present

## 2019-07-29 DIAGNOSIS — I2782 Chronic pulmonary embolism: Secondary | ICD-10-CM | POA: Diagnosis not present

## 2019-07-29 DIAGNOSIS — Z7901 Long term (current) use of anticoagulants: Secondary | ICD-10-CM | POA: Diagnosis not present

## 2019-07-29 DIAGNOSIS — Z86718 Personal history of other venous thrombosis and embolism: Secondary | ICD-10-CM | POA: Diagnosis not present

## 2019-07-31 DIAGNOSIS — N2581 Secondary hyperparathyroidism of renal origin: Secondary | ICD-10-CM | POA: Diagnosis not present

## 2019-07-31 DIAGNOSIS — D631 Anemia in chronic kidney disease: Secondary | ICD-10-CM | POA: Diagnosis not present

## 2019-07-31 DIAGNOSIS — N186 End stage renal disease: Secondary | ICD-10-CM | POA: Diagnosis not present

## 2019-07-31 DIAGNOSIS — Z992 Dependence on renal dialysis: Secondary | ICD-10-CM | POA: Diagnosis not present

## 2019-08-02 ENCOUNTER — Telehealth: Payer: Self-pay | Admitting: *Deleted

## 2019-08-02 DIAGNOSIS — D631 Anemia in chronic kidney disease: Secondary | ICD-10-CM | POA: Diagnosis not present

## 2019-08-02 DIAGNOSIS — N2581 Secondary hyperparathyroidism of renal origin: Secondary | ICD-10-CM | POA: Diagnosis not present

## 2019-08-02 DIAGNOSIS — N186 End stage renal disease: Secondary | ICD-10-CM | POA: Diagnosis not present

## 2019-08-02 DIAGNOSIS — Z992 Dependence on renal dialysis: Secondary | ICD-10-CM | POA: Diagnosis not present

## 2019-08-02 NOTE — Telephone Encounter (Signed)
Pt calls and states for appr 5 days has a swollen area just under R armpit toward breast/chest area, tender to touch, no drainage. appt [facility] 8/18at 0845

## 2019-08-03 ENCOUNTER — Ambulatory Visit (INDEPENDENT_AMBULATORY_CARE_PROVIDER_SITE_OTHER): Payer: Medicare Other | Admitting: Internal Medicine

## 2019-08-03 ENCOUNTER — Other Ambulatory Visit: Payer: Self-pay

## 2019-08-03 DIAGNOSIS — R238 Other skin changes: Secondary | ICD-10-CM

## 2019-08-03 DIAGNOSIS — Z992 Dependence on renal dialysis: Secondary | ICD-10-CM | POA: Diagnosis not present

## 2019-08-03 DIAGNOSIS — F17210 Nicotine dependence, cigarettes, uncomplicated: Secondary | ICD-10-CM

## 2019-08-03 DIAGNOSIS — N186 End stage renal disease: Secondary | ICD-10-CM

## 2019-08-03 DIAGNOSIS — R591 Generalized enlarged lymph nodes: Secondary | ICD-10-CM | POA: Insufficient documentation

## 2019-08-03 NOTE — Patient Instructions (Addendum)
Brian [name],   It was a pleasure taking care of you today. As we discussed, please continue to observe the lymph node and if it starts to get bigger please let us know but we believe this could have been a result of an overlying skin infection that is resolving.  If it does not improve, we can think about something we called an excisional biopsy.  I would also encourage you to cut back on smoking.  Take Care Dr. Eileen Stanford  Please call the internal medicine center clinic if you have any questions or concerns, we may be able to help and keep you from a long and expensive emergency room wait. Our clinic and after hours phone number is [phone], the best time to call is Monday through Friday 9 am to 4 pm but there is always someone available 24/7 if you have an emergency. If you need medication refills please notify your pharmacy one week in advance and they will send [place] a request.
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
## 2019-08-03 NOTE — Assessment & Plan Note (Signed)
Lymphadenopathy: Mr. Shusterman states that he noticed "cyst "on the right side of his chest about a week ago.  Could not really give a definitive number for the size of the "cyst," however states that has decreased in size.  He denies any recent infection or falls but does report of having about a 16 pound weight loss in over a year and also experiencing night sweats in a span of the last year.  On physical exams, there is One - 1-cm firm mildly tender lymph node palpated at the right chest with overlying darkening skin changes which could possibly represent healing skin infection or bruise.  Assessment: With physical exam findings and point-of-care ultrasound, suspicion for malignancy is low though he has a significant smoking history.  He recently had CT angiography chest performed in March which was unremarkable.  His white cell count is unremarkable.  Plan: - Advised to monitor lymph node and if it worsens, can consider excisional biopsy.

## 2019-08-04 DIAGNOSIS — N2581 Secondary hyperparathyroidism of renal origin: Secondary | ICD-10-CM | POA: Diagnosis not present

## 2019-08-04 DIAGNOSIS — Z992 Dependence on renal dialysis: Secondary | ICD-10-CM | POA: Diagnosis not present

## 2019-08-04 DIAGNOSIS — D631 Anemia in chronic kidney disease: Secondary | ICD-10-CM | POA: Diagnosis not present

## 2019-08-04 DIAGNOSIS — N186 End stage renal disease: Secondary | ICD-10-CM | POA: Diagnosis not present
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
## 2019-08-06 DIAGNOSIS — N2581 Secondary hyperparathyroidism of renal origin: Secondary | ICD-10-CM | POA: Diagnosis not present

## 2019-08-06 DIAGNOSIS — D631 Anemia in chronic kidney disease: Secondary | ICD-10-CM | POA: Diagnosis not present

## 2019-08-06 DIAGNOSIS — N186 End stage renal disease: Secondary | ICD-10-CM | POA: Diagnosis not present

## 2019-08-06 DIAGNOSIS — Z992 Dependence on renal dialysis: Secondary | ICD-10-CM | POA: Diagnosis not present

## 2019-08-09 DIAGNOSIS — D631 Anemia in chronic kidney disease: Secondary | ICD-10-CM | POA: Diagnosis not present

## 2019-08-09 DIAGNOSIS — N186 End stage renal disease: Secondary | ICD-10-CM | POA: Diagnosis not present

## 2019-08-09 DIAGNOSIS — N2581 Secondary hyperparathyroidism of renal origin: Secondary | ICD-10-CM | POA: Diagnosis not present

## 2019-08-09 DIAGNOSIS — Z992 Dependence on renal dialysis: Secondary | ICD-10-CM | POA: Diagnosis not present

## 2019-08-13 DIAGNOSIS — D631 Anemia in chronic kidney disease: Secondary | ICD-10-CM | POA: Diagnosis not present

## 2019-08-13 DIAGNOSIS — N186 End stage renal disease: Secondary | ICD-10-CM | POA: Diagnosis not present

## 2019-08-13 DIAGNOSIS — Z992 Dependence on renal dialysis: Secondary | ICD-10-CM | POA: Diagnosis not present

## 2019-08-13 DIAGNOSIS — N2581 Secondary hyperparathyroidism of renal origin: Secondary | ICD-10-CM | POA: Diagnosis not present

## 2019-08-16 DIAGNOSIS — N186 End stage renal disease: Secondary | ICD-10-CM | POA: Diagnosis not present

## 2019-08-16 DIAGNOSIS — D631 Anemia in chronic kidney disease: Secondary | ICD-10-CM | POA: Diagnosis not present

## 2019-08-16 DIAGNOSIS — Z992 Dependence on renal dialysis: Secondary | ICD-10-CM | POA: Diagnosis not present

## 2019-08-16 DIAGNOSIS — N2581 Secondary hyperparathyroidism of renal origin: Secondary | ICD-10-CM | POA: Diagnosis not present

## 2019-08-17 ENCOUNTER — Other Ambulatory Visit: Payer: Self-pay

## 2019-08-17 DIAGNOSIS — N186 End stage renal disease: Secondary | ICD-10-CM | POA: Diagnosis not present

## 2019-08-17 DIAGNOSIS — I120 Hypertensive chronic kidney disease with stage 5 chronic kidney disease or end stage renal disease: Secondary | ICD-10-CM | POA: Diagnosis not present

## 2019-08-17 DIAGNOSIS — Z992 Dependence on renal dialysis: Secondary | ICD-10-CM | POA: Diagnosis not present

## 2019-08-17 NOTE — Telephone Encounter (Signed)
oxyCODONE-acetaminophen (PERCOCET) 10-325 MG tablet, refill request @  [id number] - [place], [place] - [address] AT [address] [phone] (Phone) [phone] (Fax)    

## 2019-08-18 ENCOUNTER — Other Ambulatory Visit: Payer: Self-pay | Admitting: Internal Medicine

## 2019-08-18 ENCOUNTER — Telehealth: Payer: Self-pay | Admitting: Pharmacist

## 2019-08-18 DIAGNOSIS — N2581 Secondary hyperparathyroidism of renal origin: Secondary | ICD-10-CM | POA: Diagnosis not present

## 2019-08-18 DIAGNOSIS — D631 Anemia in chronic kidney disease: Secondary | ICD-10-CM | POA: Diagnosis not present

## 2019-08-18 DIAGNOSIS — N186 End stage renal disease: Secondary | ICD-10-CM | POA: Diagnosis not present

## 2019-08-18 DIAGNOSIS — Z992 Dependence on renal dialysis: Secondary | ICD-10-CM | POA: Diagnosis not present

## 2019-08-18 DIAGNOSIS — E875 Hyperkalemia: Secondary | ICD-10-CM | POA: Diagnosis not present

## 2019-08-18 DIAGNOSIS — Z23 Encounter for immunization: Secondary | ICD-10-CM | POA: Diagnosis not present

## 2019-08-18 MED ORDER — OXYCODONE-ACETAMINOPHEN 10-325 MG PO TABS: 0.5000 | ORAL_TABLET | Freq: Four times a day (QID) | ORAL | 0 refills | Status: DC | PRN

## 2019-08-18 NOTE — Telephone Encounter (Signed)
Reviewed PDMP, appropriate, last utox in march, expected, will refill

## 2019-08-18 NOTE — Telephone Encounter (Signed)
Received text from patient yesterday 17-Aug-2019 at 1624h reporting PST FS POC INR 2.5 on 50mg  warfarin/wk. No bleeding. No new medications. No missed doses. Advised to continue same weekly total dose, repeat INR 2 weeks.

## 2019-08-20 DIAGNOSIS — Z992 Dependence on renal dialysis: Secondary | ICD-10-CM | POA: Diagnosis not present

## 2019-08-20 DIAGNOSIS — N2581 Secondary hyperparathyroidism of renal origin: Secondary | ICD-10-CM | POA: Diagnosis not present

## 2019-08-20 DIAGNOSIS — D631 Anemia in chronic kidney disease: Secondary | ICD-10-CM | POA: Diagnosis not present

## 2019-08-20 DIAGNOSIS — E875 Hyperkalemia: Secondary | ICD-10-CM | POA: Diagnosis not present

## 2019-08-20 DIAGNOSIS — N186 End stage renal disease: Secondary | ICD-10-CM | POA: Diagnosis not present

## 2019-08-23 DIAGNOSIS — N186 End stage renal disease: Secondary | ICD-10-CM | POA: Diagnosis not present

## 2019-08-23 DIAGNOSIS — D631 Anemia in chronic kidney disease: Secondary | ICD-10-CM | POA: Diagnosis not present

## 2019-08-23 DIAGNOSIS — E875 Hyperkalemia: Secondary | ICD-10-CM | POA: Diagnosis not present

## 2019-08-23 DIAGNOSIS — Z992 Dependence on renal dialysis: Secondary | ICD-10-CM | POA: Diagnosis not present

## 2019-08-23 DIAGNOSIS — N2581 Secondary hyperparathyroidism of renal origin: Secondary | ICD-10-CM | POA: Diagnosis not present

## 2019-08-25 DIAGNOSIS — N2581 Secondary hyperparathyroidism of renal origin: Secondary | ICD-10-CM | POA: Diagnosis not present

## 2019-08-25 DIAGNOSIS — N186 End stage renal disease: Secondary | ICD-10-CM | POA: Diagnosis not present

## 2019-08-25 DIAGNOSIS — E875 Hyperkalemia: Secondary | ICD-10-CM | POA: Diagnosis not present

## 2019-08-25 DIAGNOSIS — Z992 Dependence on renal dialysis: Secondary | ICD-10-CM | POA: Diagnosis not present

## 2019-08-25 DIAGNOSIS — D631 Anemia in chronic kidney disease: Secondary | ICD-10-CM | POA: Diagnosis not present

## 2019-08-26 ENCOUNTER — Telehealth: Payer: Self-pay | Admitting: Pharmacist

## 2019-08-26 NOTE — Telephone Encounter (Signed)
Patient texted me results of PST FS POC INR = 1.8 but with missed dose yesterday. Previous INR 2.5 on 50mg /wk warfarin. Will increase to 52.5mg  warfarin/wk (1 and 1/2 x 5mg  = 7.5mg  daily dose). Repeat INR in one week. Denies any sign/symptom of bleeding. No signs/symptoms of embolic events. Has adequate supply of warfarin on hand.

## 2019-08-27 DIAGNOSIS — Z992 Dependence on renal dialysis: Secondary | ICD-10-CM | POA: Diagnosis not present

## 2019-08-27 DIAGNOSIS — E875 Hyperkalemia: Secondary | ICD-10-CM | POA: Diagnosis not present

## 2019-08-27 DIAGNOSIS — D631 Anemia in chronic kidney disease: Secondary | ICD-10-CM | POA: Diagnosis not present

## 2019-08-27 DIAGNOSIS — N2581 Secondary hyperparathyroidism of renal origin: Secondary | ICD-10-CM | POA: Diagnosis not present

## 2019-08-27 DIAGNOSIS — N186 End stage renal disease: Secondary | ICD-10-CM | POA: Diagnosis not present

## 2019-08-30 DIAGNOSIS — D631 Anemia in chronic kidney disease: Secondary | ICD-10-CM | POA: Diagnosis not present

## 2019-08-30 DIAGNOSIS — N186 End stage renal disease: Secondary | ICD-10-CM | POA: Diagnosis not present

## 2019-08-30 DIAGNOSIS — E875 Hyperkalemia: Secondary | ICD-10-CM | POA: Diagnosis not present

## 2019-08-30 DIAGNOSIS — N2581 Secondary hyperparathyroidism of renal origin: Secondary | ICD-10-CM | POA: Diagnosis not present

## 2019-08-30 DIAGNOSIS — Z992 Dependence on renal dialysis: Secondary | ICD-10-CM | POA: Diagnosis not present

## 2019-09-02 ENCOUNTER — Other Ambulatory Visit: Payer: Self-pay | Admitting: Internal Medicine

## 2019-09-02 DIAGNOSIS — B20 Human immunodeficiency virus [HIV] disease: Secondary | ICD-10-CM

## 2019-09-03 DIAGNOSIS — E875 Hyperkalemia: Secondary | ICD-10-CM | POA: Diagnosis not present

## 2019-09-03 DIAGNOSIS — D631 Anemia in chronic kidney disease: Secondary | ICD-10-CM | POA: Diagnosis not present

## 2019-09-03 DIAGNOSIS — N186 End stage renal disease: Secondary | ICD-10-CM | POA: Diagnosis not present

## 2019-09-03 DIAGNOSIS — N2581 Secondary hyperparathyroidism of renal origin: Secondary | ICD-10-CM | POA: Diagnosis not present

## 2019-09-03 DIAGNOSIS — Z992 Dependence on renal dialysis: Secondary | ICD-10-CM | POA: Diagnosis not present

## 2019-09-05 DIAGNOSIS — Z86718 Personal history of other venous thrombosis and embolism: Secondary | ICD-10-CM | POA: Diagnosis not present

## 2019-09-05 DIAGNOSIS — I2782 Chronic pulmonary embolism: Secondary | ICD-10-CM | POA: Diagnosis not present

## 2019-09-05 DIAGNOSIS — Z7901 Long term (current) use of anticoagulants: Secondary | ICD-10-CM | POA: Diagnosis not present

## 2019-09-06 DIAGNOSIS — Z992 Dependence on renal dialysis: Secondary | ICD-10-CM | POA: Diagnosis not present

## 2019-09-06 DIAGNOSIS — E875 Hyperkalemia: Secondary | ICD-10-CM | POA: Diagnosis not present

## 2019-09-06 DIAGNOSIS — N2581 Secondary hyperparathyroidism of renal origin: Secondary | ICD-10-CM | POA: Diagnosis not present

## 2019-09-06 DIAGNOSIS — N186 End stage renal disease: Secondary | ICD-10-CM | POA: Diagnosis not present

## 2019-09-06 DIAGNOSIS — D631 Anemia in chronic kidney disease: Secondary | ICD-10-CM | POA: Diagnosis not present

## 2019-09-08 DIAGNOSIS — N2581 Secondary hyperparathyroidism of renal origin: Secondary | ICD-10-CM | POA: Diagnosis not present

## 2019-09-08 DIAGNOSIS — E875 Hyperkalemia: Secondary | ICD-10-CM | POA: Diagnosis not present

## 2019-09-08 DIAGNOSIS — Z992 Dependence on renal dialysis: Secondary | ICD-10-CM | POA: Diagnosis not present

## 2019-09-08 DIAGNOSIS — N186 End stage renal disease: Secondary | ICD-10-CM | POA: Diagnosis not present

## 2019-09-08 DIAGNOSIS — D631 Anemia in chronic kidney disease: Secondary | ICD-10-CM | POA: Diagnosis not present

## 2019-09-10 DIAGNOSIS — D631 Anemia in chronic kidney disease: Secondary | ICD-10-CM | POA: Diagnosis not present

## 2019-09-10 DIAGNOSIS — E875 Hyperkalemia: Secondary | ICD-10-CM | POA: Diagnosis not present

## 2019-09-10 DIAGNOSIS — Z992 Dependence on renal dialysis: Secondary | ICD-10-CM | POA: Diagnosis not present

## 2019-09-10 DIAGNOSIS — N2581 Secondary hyperparathyroidism of renal origin: Secondary | ICD-10-CM | POA: Diagnosis not present

## 2019-09-10 DIAGNOSIS — N186 End stage renal disease: Secondary | ICD-10-CM | POA: Diagnosis not present

## 2019-09-13 DIAGNOSIS — D631 Anemia in chronic kidney disease: Secondary | ICD-10-CM | POA: Diagnosis not present

## 2019-09-13 DIAGNOSIS — N186 End stage renal disease: Secondary | ICD-10-CM | POA: Diagnosis not present

## 2019-09-13 DIAGNOSIS — N2581 Secondary hyperparathyroidism of renal origin: Secondary | ICD-10-CM | POA: Diagnosis not present

## 2019-09-13 DIAGNOSIS — E875 Hyperkalemia: Secondary | ICD-10-CM | POA: Diagnosis not present

## 2019-09-13 DIAGNOSIS — Z992 Dependence on renal dialysis: Secondary | ICD-10-CM | POA: Diagnosis not present

## 2019-09-15 ENCOUNTER — Telehealth: Payer: Self-pay | Admitting: Pharmacist

## 2019-09-15 DIAGNOSIS — D631 Anemia in chronic kidney disease: Secondary | ICD-10-CM | POA: Diagnosis not present

## 2019-09-15 DIAGNOSIS — N2581 Secondary hyperparathyroidism of renal origin: Secondary | ICD-10-CM | POA: Diagnosis not present

## 2019-09-15 DIAGNOSIS — Z992 Dependence on renal dialysis: Secondary | ICD-10-CM | POA: Diagnosis not present

## 2019-09-15 DIAGNOSIS — E875 Hyperkalemia: Secondary | ICD-10-CM | POA: Diagnosis not present

## 2019-09-15 DIAGNOSIS — N186 End stage renal disease: Secondary | ICD-10-CM | POA: Diagnosis not present

## 2019-09-15 NOTE — Telephone Encounter (Signed)
Patient was texted and sent his instructions for his warfarin dose by secure text function. PST FS POC INR 3.1 on 52.5mg  warfarin/wk (as 1 and 1/2 x 5mg , daily). Was advised to continue this regimen. If INR goes higher than 3.1 at next INR, will decrease his total weekly dose. Denies any symptoms of bleeding or embolic events. States he has an adequate supply of warfarin on-hand.

## 2019-09-16 DIAGNOSIS — N186 End stage renal disease: Secondary | ICD-10-CM | POA: Diagnosis not present

## 2019-09-16 DIAGNOSIS — I120 Hypertensive chronic kidney disease with stage 5 chronic kidney disease or end stage renal disease: Secondary | ICD-10-CM | POA: Diagnosis not present

## 2019-09-16 DIAGNOSIS — Z992 Dependence on renal dialysis: Secondary | ICD-10-CM | POA: Diagnosis not present

## 2019-09-20 DIAGNOSIS — D631 Anemia in chronic kidney disease: Secondary | ICD-10-CM | POA: Diagnosis not present

## 2019-09-20 DIAGNOSIS — Z992 Dependence on renal dialysis: Secondary | ICD-10-CM | POA: Diagnosis not present

## 2019-09-20 DIAGNOSIS — N2581 Secondary hyperparathyroidism of renal origin: Secondary | ICD-10-CM | POA: Diagnosis not present

## 2019-09-20 DIAGNOSIS — N186 End stage renal disease: Secondary | ICD-10-CM | POA: Diagnosis not present

## 2019-09-22 DIAGNOSIS — N2581 Secondary hyperparathyroidism of renal origin: Secondary | ICD-10-CM | POA: Diagnosis not present

## 2019-09-22 DIAGNOSIS — Z992 Dependence on renal dialysis: Secondary | ICD-10-CM | POA: Diagnosis not present

## 2019-09-22 DIAGNOSIS — D631 Anemia in chronic kidney disease: Secondary | ICD-10-CM | POA: Diagnosis not present

## 2019-09-22 DIAGNOSIS — N186 End stage renal disease: Secondary | ICD-10-CM | POA: Diagnosis not present

## 2019-09-24 DIAGNOSIS — D631 Anemia in chronic kidney disease: Secondary | ICD-10-CM | POA: Diagnosis not present

## 2019-09-24 DIAGNOSIS — N186 End stage renal disease: Secondary | ICD-10-CM | POA: Diagnosis not present

## 2019-09-24 DIAGNOSIS — Z992 Dependence on renal dialysis: Secondary | ICD-10-CM | POA: Diagnosis not present

## 2019-09-24 DIAGNOSIS — N2581 Secondary hyperparathyroidism of renal origin: Secondary | ICD-10-CM | POA: Diagnosis not present

## 2019-09-27 DIAGNOSIS — N186 End stage renal disease: Secondary | ICD-10-CM | POA: Diagnosis not present

## 2019-09-27 DIAGNOSIS — N2581 Secondary hyperparathyroidism of renal origin: Secondary | ICD-10-CM | POA: Diagnosis not present

## 2019-09-27 DIAGNOSIS — D631 Anemia in chronic kidney disease: Secondary | ICD-10-CM | POA: Diagnosis not present

## 2019-09-27 DIAGNOSIS — Z992 Dependence on renal dialysis: Secondary | ICD-10-CM | POA: Diagnosis not present

## 2019-09-29 DIAGNOSIS — D631 Anemia in chronic kidney disease: Secondary | ICD-10-CM | POA: Diagnosis not present

## 2019-09-29 DIAGNOSIS — Z992 Dependence on renal dialysis: Secondary | ICD-10-CM | POA: Diagnosis not present

## 2019-09-29 DIAGNOSIS — N2581 Secondary hyperparathyroidism of renal origin: Secondary | ICD-10-CM | POA: Diagnosis not present

## 2019-09-29 DIAGNOSIS — N186 End stage renal disease: Secondary | ICD-10-CM | POA: Diagnosis not present

## 2019-10-01 DIAGNOSIS — N186 End stage renal disease: Secondary | ICD-10-CM | POA: Diagnosis not present

## 2019-10-01 DIAGNOSIS — D631 Anemia in chronic kidney disease: Secondary | ICD-10-CM | POA: Diagnosis not present

## 2019-10-01 DIAGNOSIS — Z992 Dependence on renal dialysis: Secondary | ICD-10-CM | POA: Diagnosis not present

## 2019-10-01 DIAGNOSIS — N2581 Secondary hyperparathyroidism of renal origin: Secondary | ICD-10-CM | POA: Diagnosis not present

## 2019-10-04 DIAGNOSIS — D631 Anemia in chronic kidney disease: Secondary | ICD-10-CM | POA: Diagnosis not present

## 2019-10-04 DIAGNOSIS — N2581 Secondary hyperparathyroidism of renal origin: Secondary | ICD-10-CM | POA: Diagnosis not present

## 2019-10-04 DIAGNOSIS — N186 End stage renal disease: Secondary | ICD-10-CM | POA: Diagnosis not present

## 2019-10-04 DIAGNOSIS — Z992 Dependence on renal dialysis: Secondary | ICD-10-CM | POA: Diagnosis not present

## 2019-10-06 DIAGNOSIS — N186 End stage renal disease: Secondary | ICD-10-CM | POA: Diagnosis not present

## 2019-10-06 DIAGNOSIS — D631 Anemia in chronic kidney disease: Secondary | ICD-10-CM | POA: Diagnosis not present

## 2019-10-06 DIAGNOSIS — N2581 Secondary hyperparathyroidism of renal origin: Secondary | ICD-10-CM | POA: Diagnosis not present

## 2019-10-06 DIAGNOSIS — Z992 Dependence on renal dialysis: Secondary | ICD-10-CM | POA: Diagnosis not present

## 2019-10-07 ENCOUNTER — Other Ambulatory Visit: Payer: Self-pay

## 2019-10-07 ENCOUNTER — Telehealth: Payer: Self-pay | Admitting: *Deleted

## 2019-10-07 NOTE — Telephone Encounter (Signed)
Requesting a refill on   dronabinol (MARINOL) 5 MG capsule   @  [facility] [id number] - [place], [place] [phone] (Phone) [phone] (Fax)   Please call pt back.

## 2019-10-07 NOTE — Telephone Encounter (Signed)
INR 10/06/2019 2.8

## 2019-10-08 DIAGNOSIS — N186 End stage renal disease: Secondary | ICD-10-CM | POA: Diagnosis not present

## 2019-10-08 DIAGNOSIS — Z992 Dependence on renal dialysis: Secondary | ICD-10-CM | POA: Diagnosis not present

## 2019-10-08 DIAGNOSIS — N2581 Secondary hyperparathyroidism of renal origin: Secondary | ICD-10-CM | POA: Diagnosis not present

## 2019-10-08 DIAGNOSIS — D631 Anemia in chronic kidney disease: Secondary | ICD-10-CM | POA: Diagnosis not present

## 2019-10-08 MED ORDER — DRONABINOL 5 MG PO CAPS: 5.0000 mg | ORAL_CAPSULE | Freq: Every day | ORAL | 5 refills | Status: DC

## 2019-10-09 ENCOUNTER — Other Ambulatory Visit: Payer: Self-pay | Admitting: Internal Medicine

## 2019-10-09 DIAGNOSIS — B20 Human immunodeficiency virus [HIV] disease: Secondary | ICD-10-CM

## 2019-10-11 ENCOUNTER — Telehealth: Payer: Self-pay | Admitting: *Deleted

## 2019-10-11 ENCOUNTER — Other Ambulatory Visit: Payer: Self-pay | Admitting: *Deleted

## 2019-10-11 ENCOUNTER — Telehealth: Payer: Self-pay | Admitting: Pharmacist

## 2019-10-11 DIAGNOSIS — N2581 Secondary hyperparathyroidism of renal origin: Secondary | ICD-10-CM | POA: Diagnosis not present

## 2019-10-11 DIAGNOSIS — D631 Anemia in chronic kidney disease: Secondary | ICD-10-CM | POA: Diagnosis not present

## 2019-10-11 DIAGNOSIS — B20 Human immunodeficiency virus [HIV] disease: Secondary | ICD-10-CM

## 2019-10-11 DIAGNOSIS — Z992 Dependence on renal dialysis: Secondary | ICD-10-CM | POA: Diagnosis not present

## 2019-10-11 DIAGNOSIS — N186 End stage renal disease: Secondary | ICD-10-CM | POA: Diagnosis not present

## 2019-10-11 MED ORDER — DESCOVY 200-25 MG PO TABS: 1.0000 | ORAL_TABLET | Freq: Every day | ORAL | 0 refills | Status: DC

## 2019-10-11 MED ORDER — TIVICAY 50 MG PO TABS: 50.0000 mg | ORAL_TABLET | Freq: Every day | ORAL | 0 refills | Status: DC

## 2019-10-11 NOTE — Telephone Encounter (Signed)
Patient requesting refil of descovy. He is overdue for an appointment (last seen 09/2018), needs refill of both descovy and tivicay. RN sent 30 day supply of both, left voicemail asking Ion to call and make an appointment as soon as possible.  Landis Gandy, RN

## 2019-10-11 NOTE — Telephone Encounter (Signed)
PST FS POC INR performed 06-Oct-2019 52.5mg  warfarin/wk (as 1 and 1/2 x 5mg ) qd. Advised patient to continue same regimen and repeat PST FS POC INR in 1 week. No bleeding symptoms.

## 2019-10-11 NOTE — Telephone Encounter (Signed)
He texted me the day he performed the test. I advised him to continue same warfarin regimen.

## 2019-10-13 DIAGNOSIS — D631 Anemia in chronic kidney disease: Secondary | ICD-10-CM | POA: Diagnosis not present

## 2019-10-13 DIAGNOSIS — N186 End stage renal disease: Secondary | ICD-10-CM | POA: Diagnosis not present

## 2019-10-13 DIAGNOSIS — N2581 Secondary hyperparathyroidism of renal origin: Secondary | ICD-10-CM | POA: Diagnosis not present

## 2019-10-13 DIAGNOSIS — Z992 Dependence on renal dialysis: Secondary | ICD-10-CM | POA: Diagnosis not present

## 2019-10-16 DIAGNOSIS — N2581 Secondary hyperparathyroidism of renal origin: Secondary | ICD-10-CM | POA: Diagnosis not present

## 2019-10-16 DIAGNOSIS — Z992 Dependence on renal dialysis: Secondary | ICD-10-CM | POA: Diagnosis not present

## 2019-10-16 DIAGNOSIS — N186 End stage renal disease: Secondary | ICD-10-CM | POA: Diagnosis not present

## 2019-10-16 DIAGNOSIS — D631 Anemia in chronic kidney disease: Secondary | ICD-10-CM | POA: Diagnosis not present

## 2019-10-17 DIAGNOSIS — N186 End stage renal disease: Secondary | ICD-10-CM | POA: Diagnosis not present

## 2019-10-17 DIAGNOSIS — I120 Hypertensive chronic kidney disease with stage 5 chronic kidney disease or end stage renal disease: Secondary | ICD-10-CM | POA: Diagnosis not present

## 2019-10-17 DIAGNOSIS — Z992 Dependence on renal dialysis: Secondary | ICD-10-CM | POA: Diagnosis not present

## 2019-10-18 DIAGNOSIS — N2581 Secondary hyperparathyroidism of renal origin: Secondary | ICD-10-CM | POA: Diagnosis not present

## 2019-10-18 DIAGNOSIS — N186 End stage renal disease: Secondary | ICD-10-CM | POA: Diagnosis not present

## 2019-10-18 DIAGNOSIS — Z992 Dependence on renal dialysis: Secondary | ICD-10-CM | POA: Diagnosis not present

## 2019-10-18 DIAGNOSIS — D631 Anemia in chronic kidney disease: Secondary | ICD-10-CM | POA: Diagnosis not present

## 2019-10-18 DIAGNOSIS — E875 Hyperkalemia: Secondary | ICD-10-CM | POA: Diagnosis not present

## 2019-10-19 DIAGNOSIS — Z7901 Long term (current) use of anticoagulants: Secondary | ICD-10-CM | POA: Diagnosis not present

## 2019-10-19 DIAGNOSIS — I2782 Chronic pulmonary embolism: Secondary | ICD-10-CM | POA: Diagnosis not present

## 2019-10-19 DIAGNOSIS — Z86718 Personal history of other venous thrombosis and embolism: Secondary | ICD-10-CM | POA: Diagnosis not present

## 2019-10-21 ENCOUNTER — Other Ambulatory Visit: Payer: Self-pay | Admitting: Radiation Oncology

## 2019-10-21 NOTE — Telephone Encounter (Signed)
Refill Request   oxyCODONE-acetaminophen (PERCOCET) 10-325 MG tablet   [id number] - [place],  - [address] AT [address] 

## 2019-10-22 DIAGNOSIS — N2581 Secondary hyperparathyroidism of renal origin: Secondary | ICD-10-CM | POA: Diagnosis not present

## 2019-10-22 DIAGNOSIS — D631 Anemia in chronic kidney disease: Secondary | ICD-10-CM | POA: Diagnosis not present

## 2019-10-22 DIAGNOSIS — N186 End stage renal disease: Secondary | ICD-10-CM | POA: Diagnosis not present

## 2019-10-22 DIAGNOSIS — Z992 Dependence on renal dialysis: Secondary | ICD-10-CM | POA: Diagnosis not present

## 2019-10-22 DIAGNOSIS — E875 Hyperkalemia: Secondary | ICD-10-CM | POA: Diagnosis not present

## 2019-10-25 DIAGNOSIS — Z992 Dependence on renal dialysis: Secondary | ICD-10-CM | POA: Diagnosis not present

## 2019-10-25 DIAGNOSIS — N186 End stage renal disease: Secondary | ICD-10-CM | POA: Diagnosis not present

## 2019-10-25 DIAGNOSIS — D631 Anemia in chronic kidney disease: Secondary | ICD-10-CM | POA: Diagnosis not present

## 2019-10-25 DIAGNOSIS — N2581 Secondary hyperparathyroidism of renal origin: Secondary | ICD-10-CM | POA: Diagnosis not present

## 2019-10-25 DIAGNOSIS — E875 Hyperkalemia: Secondary | ICD-10-CM | POA: Diagnosis not present

## 2019-10-26 ENCOUNTER — Ambulatory Visit (INDEPENDENT_AMBULATORY_CARE_PROVIDER_SITE_OTHER): Payer: Medicare Other | Admitting: Radiation Oncology

## 2019-10-26 ENCOUNTER — Other Ambulatory Visit: Payer: Self-pay

## 2019-10-26 ENCOUNTER — Encounter: Payer: Self-pay | Admitting: Radiation Oncology

## 2019-10-26 VITALS — BP 131/87 | HR 90 | Temp 97.9°F | Wt 183.3 lb

## 2019-10-26 DIAGNOSIS — H6693 Otitis media, unspecified, bilateral: Secondary | ICD-10-CM

## 2019-10-26 DIAGNOSIS — I132 Hypertensive heart and chronic kidney disease with heart failure and with stage 5 chronic kidney disease, or end stage renal disease: Secondary | ICD-10-CM

## 2019-10-26 DIAGNOSIS — Z9109 Other allergy status, other than to drugs and biological substances: Secondary | ICD-10-CM

## 2019-10-26 DIAGNOSIS — R011 Cardiac murmur, unspecified: Secondary | ICD-10-CM | POA: Diagnosis not present

## 2019-10-26 DIAGNOSIS — F17210 Nicotine dependence, cigarettes, uncomplicated: Secondary | ICD-10-CM | POA: Diagnosis not present

## 2019-10-26 DIAGNOSIS — G8929 Other chronic pain: Secondary | ICD-10-CM | POA: Diagnosis not present

## 2019-10-26 DIAGNOSIS — G8921 Chronic pain due to trauma: Secondary | ICD-10-CM | POA: Diagnosis not present

## 2019-10-26 DIAGNOSIS — I5022 Chronic systolic (congestive) heart failure: Secondary | ICD-10-CM

## 2019-10-26 DIAGNOSIS — H6690 Otitis media, unspecified, unspecified ear: Secondary | ICD-10-CM | POA: Insufficient documentation

## 2019-10-26 DIAGNOSIS — R21 Rash and other nonspecific skin eruption: Secondary | ICD-10-CM | POA: Diagnosis not present

## 2019-10-26 DIAGNOSIS — H9313 Tinnitus, bilateral: Secondary | ICD-10-CM | POA: Diagnosis not present

## 2019-10-26 DIAGNOSIS — Z79899 Other long term (current) drug therapy: Secondary | ICD-10-CM | POA: Diagnosis not present

## 2019-10-26 DIAGNOSIS — N186 End stage renal disease: Secondary | ICD-10-CM | POA: Diagnosis not present

## 2019-10-26 DIAGNOSIS — Z992 Dependence on renal dialysis: Secondary | ICD-10-CM

## 2019-10-26 DIAGNOSIS — Q613 Polycystic kidney, unspecified: Secondary | ICD-10-CM | POA: Diagnosis not present

## 2019-10-26 DIAGNOSIS — B20 Human immunodeficiency virus [HIV] disease: Secondary | ICD-10-CM | POA: Diagnosis not present

## 2019-10-26 DIAGNOSIS — Z79891 Long term (current) use of opiate analgesic: Secondary | ICD-10-CM

## 2019-10-26 DIAGNOSIS — F17200 Nicotine dependence, unspecified, uncomplicated: Secondary | ICD-10-CM

## 2019-10-26 DIAGNOSIS — I10 Essential (primary) hypertension: Secondary | ICD-10-CM

## 2019-10-26 MED ORDER — AMOXICILLIN-POT CLAVULANATE 500-125 MG PO TABS: 1.0000 | ORAL_TABLET | Freq: Two times a day (BID) | ORAL | 0 refills | Status: DC

## 2019-10-26 MED ORDER — ALBUTEROL SULFATE HFA 108 (90 BASE) MCG/ACT IN AERS: INHALATION_SPRAY | RESPIRATORY_TRACT | 6 refills | Status: DC

## 2019-10-26 MED ORDER — OXYCODONE-ACETAMINOPHEN 10-325 MG PO TABS: 0.5000 | ORAL_TABLET | Freq: Four times a day (QID) | ORAL | 0 refills | Status: DC | PRN

## 2019-10-26 MED ORDER — ROPINIROLE HCL 0.25 MG PO TABS: 0.2500 mg | ORAL_TABLET | Freq: Every day | ORAL | 3 refills | Status: DC

## 2019-10-26 MED ORDER — FLUTICASONE PROPIONATE 50 MCG/ACT NA SUSP: 2.0000 | Freq: Every day | NASAL | 11 refills | Status: DC
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
## 2019-10-26 NOTE — Assessment & Plan Note (Signed)
Patient with known allergies reports increasing symptoms this fall.  Reports good relief with Flonase.  Plan: Refilled Flonase prescription.

## 2019-10-26 NOTE — Assessment & Plan Note (Addendum)
Patient with known hypertension and HFrEF not on any heart failure related medication secondary to severe hypotension during dialysis.  Last seen in clinic by cardiology in 2015.  At that time they said they were severely limited in medications affecting blood pressure.  Given that was 5 years ago it is possible the patient may be able to tolerate an ACE or a beta-blocker at this time.  We have considered medication on nondialysis days.  We will refer to cardiology and defer to their management.  Will not add any additional medications at this time. Patient without chest pain or frequent shortness of breath.  He denies syncope.  He states he is not fluid overloaded as often and that dialysis is good at managing his fluid.  Blood pressure 131/87 today.  He reports his blood pressure going down to as low as 70 during dialysis.  He denies being very symptomatic during that time.  He says he does not get dizzy although he does not feel good either.  Plan: Referral to cardiology for management of HFrEF with EF of 20 to 25% in setting of severe hypotension during dialysis

## 2019-10-26 NOTE — Assessment & Plan Note (Addendum)
Patient with known HFrEF with last echo April 2019 with EF of 20 to 25% not on any heart failure related medication secondary to severe hypotension during dialysis.  Last seen in clinic by cardiology in 2015.  At that time they said they were severely limited in medications.  Given the length of time it is possible that patient may be able to tolerate an ACE or a beta-blocker.  We have considered medication on nondialysis days.  We will refer to cardiology and defer to their management.  Patient without chest pain or frequent shortness of breath.  He denies syncope.  He states he is not fluid overloaded as often and that dialysis is good at managing his fluid.  Blood pressure 131/87 today.  Plan: Referral to cardiology for management of HFrEF with EF of 20 to 25% in setting of severe hypotension during dialysis
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
## 2019-10-26 NOTE — Assessment & Plan Note (Signed)
Patient with known tobacco use reports smoking a half a pack a day currently.  When asked if he is interested in quitting he says "yes sort of" but that he is not ready.  Urged patient to quit and let him know we are here to help with medication and support services.

## 2019-10-26 NOTE — Assessment & Plan Note (Signed)
Patient with HIV diagnosed 2002 who has been undetectable since that time currently on Tivicay and Descovy.  He states his HIV is managed by infectious disease physicians.  He reports good adherence to his medications.

## 2019-10-26 NOTE — Assessment & Plan Note (Signed)
Patient with history of HIV and ESRD on dialysis MWF here with a regularly scheduled primary care follow-up and notes he has had bilateral ringing in his ears, pain and discharge for the last 2 weeks.  He reports some sinus pain and pressure.  He has headaches in his temples bilaterally.  On exam patient had erythematous auditory canals bilaterally with bulging, cloudy tympanic membranes bilaterally.  Patient tender during exam.  Plan: Given immunosuppression with HIV, recommendation is for increased dose of amoxicillin up to 1000 twice daily.  Given ESRD, recommendation is reduced dosage of 250 to 500 mg amoxicillin 1-2 times daily.  Thus patient prescribed 500-125 Augmentin for 7 days.  Instructed to let us know if his symptoms worsen or fail to improve.

## 2019-10-26 NOTE — Assessment & Plan Note (Signed)
Patient with ESRD from polycystic kidney disease on HD on MWF.  Patient states he is tolerating dialysis well.  He reports getting his flu shot at dialysis.  He states his electrolytes are being managed at dialysis.  His blood pressures go as low as 70 systolic during dialysis, however, patient denies being very symptomatic with these blood pressures.

## 2019-10-26 NOTE — Patient Instructions (Addendum)
Thank you for coming to your appointment. It was so nice to see you. Today we discussed  Tobacco Use - let us know if and when you want help quitting!  Chronic Pain - refilled prescriptions   Environmental allergies - refilled nasal spray  Chronic systolic heart failure ([place]) - will refer you back to cardiology  Acute otitis media, unspecified otitis media type - ordered amoxicillin 500 twice a day for 7 days    I look forward to seeing you again soon at your follow up in 3 months.  Sincerely,  Al Decant, MD

## 2019-10-27 DIAGNOSIS — N2581 Secondary hyperparathyroidism of renal origin: Secondary | ICD-10-CM | POA: Diagnosis not present

## 2019-10-27 DIAGNOSIS — D631 Anemia in chronic kidney disease: Secondary | ICD-10-CM | POA: Diagnosis not present

## 2019-10-27 DIAGNOSIS — E875 Hyperkalemia: Secondary | ICD-10-CM | POA: Diagnosis not present

## 2019-10-27 DIAGNOSIS — Z992 Dependence on renal dialysis: Secondary | ICD-10-CM | POA: Diagnosis not present

## 2019-10-27 DIAGNOSIS — N186 End stage renal disease: Secondary | ICD-10-CM | POA: Diagnosis not present

## 2019-10-29 DIAGNOSIS — E875 Hyperkalemia: Secondary | ICD-10-CM | POA: Diagnosis not present

## 2019-10-29 DIAGNOSIS — N186 End stage renal disease: Secondary | ICD-10-CM | POA: Diagnosis not present

## 2019-10-29 DIAGNOSIS — N2581 Secondary hyperparathyroidism of renal origin: Secondary | ICD-10-CM | POA: Diagnosis not present

## 2019-10-29 DIAGNOSIS — Z992 Dependence on renal dialysis: Secondary | ICD-10-CM | POA: Diagnosis not present

## 2019-10-29 DIAGNOSIS — D631 Anemia in chronic kidney disease: Secondary | ICD-10-CM | POA: Diagnosis not present

## 2019-11-01 ENCOUNTER — Telehealth: Payer: Self-pay | Admitting: Pharmacist

## 2019-11-01 DIAGNOSIS — N2581 Secondary hyperparathyroidism of renal origin: Secondary | ICD-10-CM | POA: Diagnosis not present

## 2019-11-01 DIAGNOSIS — N186 End stage renal disease: Secondary | ICD-10-CM | POA: Diagnosis not present

## 2019-11-01 DIAGNOSIS — Z992 Dependence on renal dialysis: Secondary | ICD-10-CM | POA: Diagnosis not present

## 2019-11-01 DIAGNOSIS — D631 Anemia in chronic kidney disease: Secondary | ICD-10-CM | POA: Diagnosis not present

## 2019-11-01 DIAGNOSIS — E875 Hyperkalemia: Secondary | ICD-10-CM | POA: Diagnosis not present

## 2019-11-01 NOTE — Progress Notes (Signed)
Internal Medicine Clinic Attending  I saw and evaluated the patient.  I personally confirmed the key portions of the history and exam documented by Dr. Lanier and I reviewed pertinent patient test results.  The assessment, diagnosis, and plan were formulated together and I agree with the documentation in the resident's note.   

## 2019-11-01 NOTE — Telephone Encounter (Signed)
Patient called me with this result of a PST FS POC INR obtained on Satuday 30-Oct-2019 at 2127h. He was advised to OMIT that days dose if not taken. It HAD been taken on Saturday. He was advised to OMIT dose on Sunday, 31-Oct-2019. He did NOT take a dose on Sunday. Texted me today indicating "I have been on an antibiotic that I failed to mention to you Saturday night, I have been taking Amoxicillin/Clavulanate 500mg  tabs, since Thursday 28-Oct-2019." Text continued--indicating he has vitamin K1 100 microgram tablets. He states he has hemodialysis today. States "I have had some bleeding (external) from his ear." I advised him to TAKE a 100 microgram tablet of vitamin K1 since he is reporting bleeding. To check an INR for TODAY *(3.7 value was on Saturday, 30-Oct-2019) after HD. Report value to me and I will give subsequent dosing instructions.

## 2019-11-02 ENCOUNTER — Ambulatory Visit (INDEPENDENT_AMBULATORY_CARE_PROVIDER_SITE_OTHER): Payer: Medicare Other | Admitting: Internal Medicine

## 2019-11-02 ENCOUNTER — Telehealth: Payer: Self-pay | Admitting: *Deleted

## 2019-11-02 ENCOUNTER — Other Ambulatory Visit: Payer: Self-pay

## 2019-11-02 VITALS — BP 117/81 | HR 92 | Temp 97.7°F | Ht 75.0 in | Wt 174.7 lb

## 2019-11-02 DIAGNOSIS — Z86711 Personal history of pulmonary embolism: Secondary | ICD-10-CM | POA: Diagnosis not present

## 2019-11-02 DIAGNOSIS — F17210 Nicotine dependence, cigarettes, uncomplicated: Secondary | ICD-10-CM

## 2019-11-02 DIAGNOSIS — H9221 Otorrhagia, right ear: Secondary | ICD-10-CM

## 2019-11-02 DIAGNOSIS — Z792 Long term (current) use of antibiotics: Secondary | ICD-10-CM

## 2019-11-02 DIAGNOSIS — H6691 Otitis media, unspecified, right ear: Secondary | ICD-10-CM | POA: Diagnosis not present

## 2019-11-02 DIAGNOSIS — Z7901 Long term (current) use of anticoagulants: Secondary | ICD-10-CM | POA: Diagnosis not present

## 2019-11-02 NOTE — Assessment & Plan Note (Signed)
Patient with history of multiple unprovoked VTE.  Currently on treatment with Coumadin monitor his INR with home testing kit and communicates with Dr. Elie Confer.  INR was elevated at 3.7 on 11/14 in setting of Augmentin usage.  Patient reports that INR was 3.0 yesterday after taking vitamin K, and warfarin modifications. *INR in therapeutic range, no signs or symptoms of significant or active bleeding.  Continue warfarin therapy per Dr. Elie Confer

## 2019-11-02 NOTE — Assessment & Plan Note (Addendum)
Patient on day 7 of antibiotic course. No evidence for infection on otoscopic examination. However, patient has had three days of mild bleeding from right ear after trauma from q-tip. Otoscopic examination normal on left side. Right ear otostopic examination reveals large clot blocking visualization of right tympanic membrane. Concern for tympanic membrane damage. Hearing loss may be secondary to clot vs. Tympanic membrane damage. Discussed case with Dr. Constance Holster, ENT, who recommends non-urgent ENT appointment. * ENT referral placed. Advised against use of qtips

## 2019-11-02 NOTE — Patient Instructions (Signed)
You were seen in clinic following bleeding to your right ear.  We have sent a referral to the ENT doctors who will schedule you for an appointment to remove the clot from your ear and check for any damage.  We are going to check and INR and your blood levels today.   Thank you for allowing [place] to be part of your medical care.
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
## 2019-11-03 ENCOUNTER — Telehealth: Payer: Self-pay | Admitting: Pharmacist

## 2019-11-03 ENCOUNTER — Encounter: Payer: Self-pay | Admitting: *Deleted

## 2019-11-03 DIAGNOSIS — D631 Anemia in chronic kidney disease: Secondary | ICD-10-CM | POA: Diagnosis not present

## 2019-11-03 DIAGNOSIS — N186 End stage renal disease: Secondary | ICD-10-CM | POA: Diagnosis not present

## 2019-11-03 DIAGNOSIS — E875 Hyperkalemia: Secondary | ICD-10-CM | POA: Diagnosis not present

## 2019-11-03 DIAGNOSIS — Z992 Dependence on renal dialysis: Secondary | ICD-10-CM | POA: Diagnosis not present

## 2019-11-03 DIAGNOSIS — N2581 Secondary hyperparathyroidism of renal origin: Secondary | ICD-10-CM | POA: Diagnosis not present

## 2019-11-03 NOTE — Progress Notes (Signed)
Internal Medicine Clinic Attending  I saw and evaluated the patient.  I personally confirmed the key portions of the history and exam documented by Dr. Benjamine Mola and I reviewed pertinent patient test results.  The assessment, diagnosis, and plan were formulated together and I agree with the documentation in the resident's note.      Attempted to visualize TM on the right and large clot blocking ear canal.  Left TM was clear, no sign of effusion or rupture.

## 2019-11-03 NOTE — Telephone Encounter (Signed)
Patient called me his PST FS POC INR results for today after OMITTING (my instruction) warfarin on Monday and Tuesday. INR today 2.0 (goal 2.0 - 3.0) but with still FOUR days of DDI interacting antibiotics. The bleeding coming from his ear has subsided he states. He has been advised to resume warfarin at 1 & 1/2 x 5mg  (7.5mg ) on Wednesday, Thursday, Saturday and Sunday. He will take only 5mg  on Friday. He will retest PST FS POC INR on Monday 08-Nov-2019--unless his ear bleeding resumes. He has been instructed if that occurs, to perform a PST FS POC INR and call me with results before taking any additional warfarin. He verbalized understanding of these instructions.

## 2019-11-05 DIAGNOSIS — N186 End stage renal disease: Secondary | ICD-10-CM | POA: Diagnosis not present

## 2019-11-05 DIAGNOSIS — D631 Anemia in chronic kidney disease: Secondary | ICD-10-CM | POA: Diagnosis not present

## 2019-11-05 DIAGNOSIS — E875 Hyperkalemia: Secondary | ICD-10-CM | POA: Diagnosis not present

## 2019-11-05 DIAGNOSIS — N2581 Secondary hyperparathyroidism of renal origin: Secondary | ICD-10-CM | POA: Diagnosis not present

## 2019-11-05 DIAGNOSIS — Z992 Dependence on renal dialysis: Secondary | ICD-10-CM | POA: Diagnosis not present

## 2019-11-09 DIAGNOSIS — E875 Hyperkalemia: Secondary | ICD-10-CM | POA: Diagnosis not present

## 2019-11-09 DIAGNOSIS — N186 End stage renal disease: Secondary | ICD-10-CM | POA: Diagnosis not present

## 2019-11-09 DIAGNOSIS — Z992 Dependence on renal dialysis: Secondary | ICD-10-CM | POA: Diagnosis not present

## 2019-11-09 DIAGNOSIS — D631 Anemia in chronic kidney disease: Secondary | ICD-10-CM | POA: Diagnosis not present

## 2019-11-09 DIAGNOSIS — N2581 Secondary hyperparathyroidism of renal origin: Secondary | ICD-10-CM | POA: Diagnosis not present

## 2019-11-10 ENCOUNTER — Telehealth: Payer: Self-pay | Admitting: Pharmacist

## 2019-11-10 ENCOUNTER — Telehealth: Payer: Self-pay | Admitting: *Deleted

## 2019-11-10 NOTE — Telephone Encounter (Signed)
Received fax from [facility] with INR results from 06/01/2019 to 11/03/2019. Last INR 2.0 on 11/03/2019. Dr. Elie Confer is already aware of last INR. Results placed in Dr. Gladstone Pih box. Hubbard Hartshorn, RN, BSN

## 2019-11-10 NOTE — Telephone Encounter (Signed)
Patient called me 8:05PM 24-NOV-20 reporting PST FS POC INR = 2.6 after omitting two days of warfarin as had been instructed. Advised to resume: 1&1/2 x 5mg  Wed/Thur/Sat/Sun; 1x5mg  on Mon/Tues/Friday. Repeat INR on 16-Nov-2019. Denies bleeding.

## 2019-11-10 NOTE — Telephone Encounter (Signed)
Have contacted the patient and provided instructions. Will enter these in to Epic/CHL.

## 2019-11-12 DIAGNOSIS — D631 Anemia in chronic kidney disease: Secondary | ICD-10-CM | POA: Diagnosis not present

## 2019-11-12 DIAGNOSIS — N186 End stage renal disease: Secondary | ICD-10-CM | POA: Diagnosis not present

## 2019-11-12 DIAGNOSIS — E875 Hyperkalemia: Secondary | ICD-10-CM | POA: Diagnosis not present

## 2019-11-12 DIAGNOSIS — N2581 Secondary hyperparathyroidism of renal origin: Secondary | ICD-10-CM | POA: Diagnosis not present

## 2019-11-12 DIAGNOSIS — Z992 Dependence on renal dialysis: Secondary | ICD-10-CM | POA: Diagnosis not present

## 2019-11-15 DIAGNOSIS — N186 End stage renal disease: Secondary | ICD-10-CM | POA: Diagnosis not present

## 2019-11-15 DIAGNOSIS — N2581 Secondary hyperparathyroidism of renal origin: Secondary | ICD-10-CM | POA: Diagnosis not present

## 2019-11-15 DIAGNOSIS — Z992 Dependence on renal dialysis: Secondary | ICD-10-CM | POA: Diagnosis not present

## 2019-11-15 DIAGNOSIS — E875 Hyperkalemia: Secondary | ICD-10-CM | POA: Diagnosis not present

## 2019-11-15 DIAGNOSIS — D631 Anemia in chronic kidney disease: Secondary | ICD-10-CM | POA: Diagnosis not present

## 2019-11-16 ENCOUNTER — Other Ambulatory Visit: Payer: Self-pay | Admitting: Internal Medicine

## 2019-11-16 ENCOUNTER — Other Ambulatory Visit: Payer: Self-pay | Admitting: *Deleted

## 2019-11-16 DIAGNOSIS — B20 Human immunodeficiency virus [HIV] disease: Secondary | ICD-10-CM

## 2019-11-16 MED ORDER — DESCOVY 200-25 MG PO TABS: 1.0000 | ORAL_TABLET | Freq: Every day | ORAL | 0 refills | Status: DC

## 2019-11-16 MED ORDER — TIVICAY 50 MG PO TABS: 50.0000 mg | ORAL_TABLET | Freq: Every day | ORAL | 0 refills | Status: DC

## 2019-11-18 ENCOUNTER — Other Ambulatory Visit: Payer: Medicare Other

## 2019-11-18 ENCOUNTER — Other Ambulatory Visit: Payer: Self-pay

## 2019-11-18 DIAGNOSIS — Z113 Encounter for screening for infections with a predominantly sexual mode of transmission: Secondary | ICD-10-CM

## 2019-11-18 DIAGNOSIS — B20 Human immunodeficiency virus [HIV] disease: Secondary | ICD-10-CM

## 2019-11-19 LAB — T-HELPER CELL (CD4) - (RCID CLINIC ONLY)
CD4 % Helper T Cell: 51 % (ref 33–65)
CD4 T Cell Abs: 840 /uL (ref 400–1790)

## 2019-11-20 DIAGNOSIS — I2782 Chronic pulmonary embolism: Secondary | ICD-10-CM | POA: Diagnosis not present

## 2019-11-20 DIAGNOSIS — Z7901 Long term (current) use of anticoagulants: Secondary | ICD-10-CM | POA: Diagnosis not present

## 2019-11-20 DIAGNOSIS — Z86718 Personal history of other venous thrombosis and embolism: Secondary | ICD-10-CM | POA: Diagnosis not present

## 2019-11-22 ENCOUNTER — Telehealth: Payer: Self-pay | Admitting: Pharmacist

## 2019-11-22 NOTE — Telephone Encounter (Signed)
Patient contacted me on Saturday, 20-Nov-2019 at 4:34p reporting PST FS POC INR 2.7 on 45mg  warfarin per week. Was advised to CONTINUE same regimen and repeat PST FS POC INR as per agreement with device provider. Denied any symptoms of bleeding. No new medications. Adequate supply of warfarin on hand he stated.

## 2019-11-23 ENCOUNTER — Ambulatory Visit: Payer: Medicare Other | Admitting: Cardiovascular Disease

## 2019-11-29 LAB — CBC WITH DIFFERENTIAL/PLATELET
Absolute Monocytes: 598 cells/uL (ref 200–950)
Basophils Absolute: 32 cells/uL (ref 0–200)
Basophils Relative: 0.7 %
Eosinophils Absolute: 51 cells/uL (ref 15–500)
Eosinophils Relative: 1.1 %
HCT: 48.8 % (ref 38.5–50.0)
Hemoglobin: 16.8 g/dL (ref 13.2–17.1)
Lymphs Abs: 1670 cells/uL (ref 850–3900)
MCH: 34.2 pg — ABNORMAL HIGH (ref 27.0–33.0)
MCHC: 34.4 g/dL (ref 32.0–36.0)
MCV: 99.4 fL (ref 80.0–100.0)
MPV: 10.7 fL (ref 7.5–12.5)
Monocytes Relative: 13 %
Neutro Abs: 2249 cells/uL (ref 1500–7800)
Neutrophils Relative %: 48.9 %
Platelets: 145 10*3/uL (ref 140–400)
RBC: 4.91 10*6/uL (ref 4.20–5.80)
RDW: 15.6 % — ABNORMAL HIGH (ref 11.0–15.0)
Total Lymphocyte: 36.3 %
WBC: 4.6 10*3/uL (ref 3.8–10.8)

## 2019-11-29 LAB — COMPREHENSIVE METABOLIC PANEL
AG Ratio: 1.4 (calc) (ref 1.0–2.5)
ALT: 17 U/L (ref 9–46)
AST: 33 U/L (ref 10–40)
Albumin: 4.7 g/dL (ref 3.6–5.1)
Alkaline phosphatase (APISO): 84 U/L (ref 36–130)
BUN/Creatinine Ratio: 7 (calc) (ref 6–22)
BUN: 49 mg/dL — ABNORMAL HIGH (ref 7–25)
CO2: 27 mmol/L (ref 20–32)
Calcium: 11.2 mg/dL — ABNORMAL HIGH (ref 8.6–10.3)
Chloride: 86 mmol/L — ABNORMAL LOW (ref 98–110)
Creat: 6.79 mg/dL — ABNORMAL HIGH (ref 0.60–1.35)
Globulin: 3.3 g/dL (calc) (ref 1.9–3.7)
Glucose, Bld: 104 mg/dL — ABNORMAL HIGH (ref 65–99)
Potassium: 5.5 mmol/L — ABNORMAL HIGH (ref 3.5–5.3)
Sodium: 133 mmol/L — ABNORMAL LOW (ref 135–146)
Total Bilirubin: 0.8 mg/dL (ref 0.2–1.2)
Total Protein: 8 g/dL (ref 6.1–8.1)

## 2019-11-29 LAB — RPR: RPR Ser Ql: NONREACTIVE

## 2019-11-29 LAB — HIV-1 RNA QUANT-NO REFLEX-BLD
HIV 1 RNA Quant: <20 copies/mL
HIV-1 RNA Quant, Log: <1.3 Log copies/mL

## 2019-11-30 ENCOUNTER — Ambulatory Visit: Payer: Medicare Other | Admitting: Internal Medicine

## 2019-11-30 ENCOUNTER — Other Ambulatory Visit: Payer: Self-pay

## 2019-11-30 ENCOUNTER — Other Ambulatory Visit: Payer: Self-pay | Admitting: Internal Medicine

## 2019-11-30 DIAGNOSIS — B20 Human immunodeficiency virus [HIV] disease: Secondary | ICD-10-CM

## 2019-11-30 MED ORDER — TIVICAY 50 MG PO TABS: 50.0000 mg | ORAL_TABLET | Freq: Every day | ORAL | 5 refills | Status: DC

## 2019-11-30 MED ORDER — DESCOVY 200-25 MG PO TABS: 1.0000 | ORAL_TABLET | Freq: Every day | ORAL | 5 refills | Status: DC

## 2019-12-17 DIAGNOSIS — Z992 Dependence on renal dialysis: Secondary | ICD-10-CM | POA: Diagnosis not present

## 2019-12-17 DIAGNOSIS — N186 End stage renal disease: Secondary | ICD-10-CM | POA: Diagnosis not present

## 2019-12-17 DIAGNOSIS — I120 Hypertensive chronic kidney disease with stage 5 chronic kidney disease or end stage renal disease: Secondary | ICD-10-CM | POA: Diagnosis not present

## 2019-12-20 DIAGNOSIS — N2581 Secondary hyperparathyroidism of renal origin: Secondary | ICD-10-CM | POA: Diagnosis not present

## 2019-12-20 DIAGNOSIS — Z992 Dependence on renal dialysis: Secondary | ICD-10-CM | POA: Diagnosis not present

## 2019-12-20 DIAGNOSIS — N186 End stage renal disease: Secondary | ICD-10-CM | POA: Diagnosis not present

## 2019-12-21 ENCOUNTER — Ambulatory Visit (INDEPENDENT_AMBULATORY_CARE_PROVIDER_SITE_OTHER): Payer: Medicare Other | Admitting: Internal Medicine

## 2019-12-21 ENCOUNTER — Other Ambulatory Visit: Payer: Self-pay

## 2019-12-21 ENCOUNTER — Encounter: Payer: Self-pay | Admitting: Internal Medicine

## 2019-12-21 VITALS — BP 128/76 | HR 90 | Wt 180.2 lb

## 2019-12-21 DIAGNOSIS — I10 Essential (primary) hypertension: Secondary | ICD-10-CM | POA: Diagnosis not present

## 2019-12-21 DIAGNOSIS — Z113 Encounter for screening for infections with a predominantly sexual mode of transmission: Secondary | ICD-10-CM | POA: Diagnosis not present

## 2019-12-21 DIAGNOSIS — B20 Human immunodeficiency virus [HIV] disease: Secondary | ICD-10-CM | POA: Diagnosis not present

## 2019-12-21 MED ORDER — DESCOVY 200-25 MG PO TABS: 1.0000 | ORAL_TABLET | Freq: Every day | ORAL | 11 refills | Status: DC

## 2019-12-21 MED ORDER — TIVICAY 50 MG PO TABS: 50.0000 mg | ORAL_TABLET | Freq: Every day | ORAL | 11 refills | Status: DC

## 2019-12-22 DIAGNOSIS — N2581 Secondary hyperparathyroidism of renal origin: Secondary | ICD-10-CM | POA: Diagnosis not present

## 2019-12-22 DIAGNOSIS — Z992 Dependence on renal dialysis: Secondary | ICD-10-CM | POA: Diagnosis not present

## 2019-12-22 DIAGNOSIS — N186 End stage renal disease: Secondary | ICD-10-CM | POA: Diagnosis not present

## 2019-12-23 ENCOUNTER — Encounter: Payer: Self-pay | Admitting: Internal Medicine

## 2019-12-23 NOTE — Assessment & Plan Note (Signed)
Screened negative 

## 2019-12-23 NOTE — Assessment & Plan Note (Signed)
BP wnl, continue on same medications

## 2019-12-23 NOTE — Assessment & Plan Note (Signed)
He is doing well on his current regimen and no changes indicated.   rtc 1 year

## 2019-12-23 NOTE — Progress Notes (Signed)
   Subjective:    Patient ID: GUNAR TRIMMER, male    DOB: Oct 27, 1976, 44 y.o.   MRN: [id number]  HPI Here for follow up of HIV He has continued on Descovy and Tivicay and has some occasional missed doses.  Not more than about 1 per month.  No concerns today.  Continues on dialysis.  Labs reviewed.  No complaints.    Review of Systems  Constitutional: Negative for fatigue.  Gastrointestinal: Negative for diarrhea and nausea.  Skin: Negative for rash.       Objective:   Physical Exam Constitutional:      Appearance: Normal appearance.  Eyes:     General: No scleral icterus. Cardiovascular:     Rate and Rhythm: Normal rate and regular rhythm.  Pulmonary:     Effort: Pulmonary effort is normal. No respiratory distress.  Skin:    Findings: No rash.  Neurological:     Mental Status: He is alert.  Psychiatric:        Mood and Affect: Mood normal.   SH: + tobacco        Assessment & Plan:

## 2019-12-24 DIAGNOSIS — N2581 Secondary hyperparathyroidism of renal origin: Secondary | ICD-10-CM | POA: Diagnosis not present

## 2019-12-24 DIAGNOSIS — Z992 Dependence on renal dialysis: Secondary | ICD-10-CM | POA: Diagnosis not present

## 2019-12-24 DIAGNOSIS — N186 End stage renal disease: Secondary | ICD-10-CM | POA: Diagnosis not present

## 2019-12-27 DIAGNOSIS — N186 End stage renal disease: Secondary | ICD-10-CM | POA: Diagnosis not present

## 2019-12-27 DIAGNOSIS — Z992 Dependence on renal dialysis: Secondary | ICD-10-CM | POA: Diagnosis not present

## 2019-12-27 DIAGNOSIS — N2581 Secondary hyperparathyroidism of renal origin: Secondary | ICD-10-CM | POA: Diagnosis not present

## 2019-12-29 DIAGNOSIS — N2581 Secondary hyperparathyroidism of renal origin: Secondary | ICD-10-CM | POA: Diagnosis not present

## 2019-12-29 DIAGNOSIS — N186 End stage renal disease: Secondary | ICD-10-CM | POA: Diagnosis not present

## 2019-12-29 DIAGNOSIS — Z992 Dependence on renal dialysis: Secondary | ICD-10-CM | POA: Diagnosis not present

## 2019-12-30 ENCOUNTER — Other Ambulatory Visit: Payer: Self-pay | Admitting: Nephrology

## 2019-12-30 DIAGNOSIS — N186 End stage renal disease: Secondary | ICD-10-CM

## 2019-12-31 DIAGNOSIS — Z992 Dependence on renal dialysis: Secondary | ICD-10-CM | POA: Diagnosis not present

## 2019-12-31 DIAGNOSIS — N186 End stage renal disease: Secondary | ICD-10-CM | POA: Diagnosis not present

## 2019-12-31 DIAGNOSIS — N2581 Secondary hyperparathyroidism of renal origin: Secondary | ICD-10-CM | POA: Diagnosis not present

## 2020-01-04 ENCOUNTER — Telehealth: Payer: Self-pay | Admitting: Pharmacist

## 2020-01-04 ENCOUNTER — Telehealth: Payer: Self-pay | Admitting: *Deleted

## 2020-01-04 DIAGNOSIS — Z86718 Personal history of other venous thrombosis and embolism: Secondary | ICD-10-CM | POA: Diagnosis not present

## 2020-01-04 DIAGNOSIS — I2782 Chronic pulmonary embolism: Secondary | ICD-10-CM | POA: Diagnosis not present

## 2020-01-04 DIAGNOSIS — Z7901 Long term (current) use of anticoagulants: Secondary | ICD-10-CM | POA: Diagnosis not present

## 2020-01-04 NOTE — Telephone Encounter (Signed)
Patient texted me results of his PST FS POC INR = 2.7 on 10mg , one day of week; 7.5mg  warfarin all other days of week. Was DECREASED to 7.5mg  warfarin PO QD. Repeat PST FS POC INR on 11-Jan-2020.

## 2020-01-04 NOTE — Telephone Encounter (Signed)
Fax from [facility] PT/INR self-testing service -    INR 2.7 done on 01/04/20 Thanks

## 2020-01-05 DIAGNOSIS — Z992 Dependence on renal dialysis: Secondary | ICD-10-CM | POA: Diagnosis not present

## 2020-01-05 DIAGNOSIS — N2581 Secondary hyperparathyroidism of renal origin: Secondary | ICD-10-CM | POA: Diagnosis not present

## 2020-01-05 DIAGNOSIS — N186 End stage renal disease: Secondary | ICD-10-CM | POA: Diagnosis not present

## 2020-01-06 ENCOUNTER — Other Ambulatory Visit: Payer: Self-pay

## 2020-01-06 ENCOUNTER — Encounter: Payer: Self-pay | Admitting: Interventional Cardiology

## 2020-01-06 ENCOUNTER — Ambulatory Visit
Admission: RE | Admit: 2020-01-06 | Discharge: 2020-01-06 | Disposition: A | Discharge status: Home / Self Care | Payer: Medicare Other | Source: Ambulatory Visit | Attending: Nephrology | Admitting: Nephrology

## 2020-01-06 ENCOUNTER — Ambulatory Visit (INDEPENDENT_AMBULATORY_CARE_PROVIDER_SITE_OTHER): Payer: Medicare Other | Admitting: Interventional Cardiology

## 2020-01-06 VITALS — BP 130/70 | HR 83 | Ht 75.0 in | Wt 185.1 lb

## 2020-01-06 DIAGNOSIS — Z720 Tobacco use: Secondary | ICD-10-CM | POA: Diagnosis not present

## 2020-01-06 DIAGNOSIS — I5022 Chronic systolic (congestive) heart failure: Secondary | ICD-10-CM

## 2020-01-06 DIAGNOSIS — Z992 Dependence on renal dialysis: Secondary | ICD-10-CM

## 2020-01-06 DIAGNOSIS — N186 End stage renal disease: Secondary | ICD-10-CM

## 2020-01-06 DIAGNOSIS — I120 Hypertensive chronic kidney disease with stage 5 chronic kidney disease or end stage renal disease: Secondary | ICD-10-CM | POA: Diagnosis not present

## 2020-01-06 DIAGNOSIS — I712 Thoracic aortic aneurysm, without rupture, unspecified: Secondary | ICD-10-CM

## 2020-01-06 NOTE — Patient Instructions (Signed)
Medication Instructions:  Your physician recommends that you continue on your current medications as directed. Please refer to the Current Medication list given to you today.  *If you need a refill on your cardiac medications before your next appointment, please call your pharmacy*  Lab Work: None ordered  If you have labs (blood work) drawn today and your tests are completely normal, you will receive your results only by: [name] MyChart Message (if you have MyChart) OR . A paper copy in the mail If you have any lab test that is abnormal or we need to change your treatment, we will call you to review the results.  Testing/Procedures: Your physician has requested that you have an echocardiogram. Echocardiography is a painless test that uses sound waves to create images of your heart. It provides your doctor with information about the size and shape of your heart and how well your heart's chambers and valves are working. This procedure takes approximately one hour. There are no restrictions for this procedure.  Follow-Up: At [facility], you and your health needs are our priority.  As part of our continuing mission to provide you with exceptional heart care, we have created designated Provider Care Teams.  These Care Teams include your primary Cardiologist (physician) and Advanced Practice Providers (APPs -  Physician Assistants and Nurse Practitioners) who all work together to provide you with the care you need, when you need it.  Your next appointment:   12 month(s)  The format for your next appointment:   In Person  Provider:   You may see Larae Grooms, MD or one of the following Advanced Practice Providers on your designated Care Team:    Melina Copa, PA-C  Ermalinda Barrios, PA-C   Other Instructions
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
## 2020-01-07 DIAGNOSIS — Z992 Dependence on renal dialysis: Secondary | ICD-10-CM | POA: Diagnosis not present

## 2020-01-07 DIAGNOSIS — N2581 Secondary hyperparathyroidism of renal origin: Secondary | ICD-10-CM | POA: Diagnosis not present

## 2020-01-07 DIAGNOSIS — N186 End stage renal disease: Secondary | ICD-10-CM | POA: Diagnosis not present

## 2020-01-10 DIAGNOSIS — N2581 Secondary hyperparathyroidism of renal origin: Secondary | ICD-10-CM | POA: Diagnosis not present

## 2020-01-10 DIAGNOSIS — N186 End stage renal disease: Secondary | ICD-10-CM | POA: Diagnosis not present

## 2020-01-10 DIAGNOSIS — Z992 Dependence on renal dialysis: Secondary | ICD-10-CM | POA: Diagnosis not present

## 2020-01-14 ENCOUNTER — Telehealth: Payer: Self-pay | Admitting: *Deleted

## 2020-01-14 DIAGNOSIS — N2581 Secondary hyperparathyroidism of renal origin: Secondary | ICD-10-CM | POA: Diagnosis not present

## 2020-01-14 DIAGNOSIS — Z992 Dependence on renal dialysis: Secondary | ICD-10-CM | POA: Diagnosis not present

## 2020-01-14 DIAGNOSIS — N186 End stage renal disease: Secondary | ICD-10-CM | POA: Diagnosis not present

## 2020-01-14 NOTE — Telephone Encounter (Signed)
Received fax from [facility] with INR results from 07/29/2019 to 01/04/2020. Last INR 2.7 on 01/04/2020. Dr. Elie Confer is already aware of latest result and has given patient directions. Results placed in Dr. Gladstone Pih office. Hubbard Hartshorn, BSN, RN-BC

## 2020-01-17 ENCOUNTER — Telehealth: Payer: Self-pay | Admitting: Pharmacist

## 2020-01-17 DIAGNOSIS — N2581 Secondary hyperparathyroidism of renal origin: Secondary | ICD-10-CM | POA: Diagnosis not present

## 2020-01-17 DIAGNOSIS — Z992 Dependence on renal dialysis: Secondary | ICD-10-CM | POA: Diagnosis not present

## 2020-01-17 DIAGNOSIS — I120 Hypertensive chronic kidney disease with stage 5 chronic kidney disease or end stage renal disease: Secondary | ICD-10-CM | POA: Diagnosis not present

## 2020-01-17 DIAGNOSIS — N186 End stage renal disease: Secondary | ICD-10-CM | POA: Diagnosis not present

## 2020-01-17 NOTE — Telephone Encounter (Signed)
Patient texted me results of PST, FS, POC INR 2.3 (target 2.0 - 3.0), on 52.5mg  (1 and 1/2 x 5mg  [7.5mg ]) warfarin per week. Advised to CONTINUE this regimen. Repeat PST POC FS INR 15FEB21. No bleeding.

## 2020-01-18 ENCOUNTER — Other Ambulatory Visit (HOSPITAL_COMMUNITY): Payer: Medicare Other

## 2020-01-19 DIAGNOSIS — Z992 Dependence on renal dialysis: Secondary | ICD-10-CM | POA: Diagnosis not present

## 2020-01-19 DIAGNOSIS — N186 End stage renal disease: Secondary | ICD-10-CM | POA: Diagnosis not present

## 2020-01-19 DIAGNOSIS — N2581 Secondary hyperparathyroidism of renal origin: Secondary | ICD-10-CM | POA: Diagnosis not present

## 2020-01-21 DIAGNOSIS — Z992 Dependence on renal dialysis: Secondary | ICD-10-CM | POA: Diagnosis not present

## 2020-01-21 DIAGNOSIS — N186 End stage renal disease: Secondary | ICD-10-CM | POA: Diagnosis not present

## 2020-01-21 DIAGNOSIS — N2581 Secondary hyperparathyroidism of renal origin: Secondary | ICD-10-CM | POA: Diagnosis not present

## 2020-01-25 ENCOUNTER — Other Ambulatory Visit: Payer: Self-pay

## 2020-01-25 ENCOUNTER — Ambulatory Visit (INDEPENDENT_AMBULATORY_CARE_PROVIDER_SITE_OTHER): Payer: Medicare Other | Admitting: Radiation Oncology

## 2020-01-25 ENCOUNTER — Encounter: Payer: Self-pay | Admitting: Radiation Oncology

## 2020-01-25 VITALS — BP 132/86 | HR 112 | Temp 98.2°F | Ht 75.0 in | Wt 183.2 lb

## 2020-01-25 DIAGNOSIS — F17200 Nicotine dependence, unspecified, uncomplicated: Secondary | ICD-10-CM

## 2020-01-25 DIAGNOSIS — E46 Unspecified protein-calorie malnutrition: Secondary | ICD-10-CM | POA: Diagnosis not present

## 2020-01-25 DIAGNOSIS — I10 Essential (primary) hypertension: Secondary | ICD-10-CM

## 2020-01-25 DIAGNOSIS — Q613 Polycystic kidney, unspecified: Secondary | ICD-10-CM

## 2020-01-25 MED ORDER — DRONABINOL 5 MG PO CAPS: 5.0000 mg | ORAL_CAPSULE | Freq: Every day | ORAL | 5 refills | Status: DC

## 2020-01-25 MED ORDER — OXYCODONE-ACETAMINOPHEN 10-325 MG PO TABS: 0.5000 | ORAL_TABLET | Freq: Four times a day (QID) | ORAL | 0 refills | Status: DC | PRN

## 2020-01-25 NOTE — Assessment & Plan Note (Addendum)
Patient with hx of malnutrition requiring chronic treatment with Marinol with improvement in muscle mass and BMI. Tolerating current dose of Marinol well. He denies any huge change in his weight. He states his weight fluctuates from time to time. Weight is stable from last visit.  Plan: -refill Marinol

## 2020-01-25 NOTE — Assessment & Plan Note (Signed)
Discussed at length the benefits and importance of smoking cessation. Patient states he understands but is not ready. He notes that he knows it would help him get on the transplant list. He knows we are here to help but is not interested at this visit.  Plan -continue cessation counseling at each visit

## 2020-01-25 NOTE — Patient Instructions (Addendum)
Thank you for coming to your appointment. It was so nice to see you. Today we discussed  Brian [name] was seen today for follow-up and medication refill.  Diagnoses and all orders for this visit:  Tobacco use  -please consider stopping smoking; we are here to help  Chronic pain -refilled percocet prescription  P keep up the good work. I look forward to seeing you again soon at your follow up in 3 months.  Sincerely,  Al Decant, MD
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
## 2020-01-26 DIAGNOSIS — N2581 Secondary hyperparathyroidism of renal origin: Secondary | ICD-10-CM | POA: Diagnosis not present

## 2020-01-26 DIAGNOSIS — N186 End stage renal disease: Secondary | ICD-10-CM | POA: Diagnosis not present

## 2020-01-26 DIAGNOSIS — Z992 Dependence on renal dialysis: Secondary | ICD-10-CM | POA: Diagnosis not present

## 2020-01-27 NOTE — Progress Notes (Signed)
Internal Medicine Clinic Attending  Case discussed with Dr. Lanierat the time of the visit.  We reviewed the resident's history and exam and pertinent patient test results.  I agree with the assessment, diagnosis, and plan of care documented in the resident's note.   

## 2020-01-28 DIAGNOSIS — Z992 Dependence on renal dialysis: Secondary | ICD-10-CM | POA: Diagnosis not present

## 2020-01-28 DIAGNOSIS — N2581 Secondary hyperparathyroidism of renal origin: Secondary | ICD-10-CM | POA: Diagnosis not present

## 2020-01-28 DIAGNOSIS — N186 End stage renal disease: Secondary | ICD-10-CM | POA: Diagnosis not present

## 2020-01-31 ENCOUNTER — Telehealth: Payer: Self-pay | Admitting: Pharmacist

## 2020-01-31 DIAGNOSIS — N2581 Secondary hyperparathyroidism of renal origin: Secondary | ICD-10-CM | POA: Diagnosis not present

## 2020-01-31 DIAGNOSIS — Z992 Dependence on renal dialysis: Secondary | ICD-10-CM | POA: Diagnosis not present

## 2020-01-31 DIAGNOSIS — N186 End stage renal disease: Secondary | ICD-10-CM | POA: Diagnosis not present

## 2020-01-31 NOTE — Telephone Encounter (Signed)
Patient texted me results of PST FS POC INR today at 1148h. INR = 2.7 on 52.5mg  (1 and 1/2 x 5mg  daily dose) total milligrams/wk. Advised to CONTINUE this regimen. Goal 2.0 - 3.0. No bleeding reported.

## 2020-02-02 DIAGNOSIS — N2581 Secondary hyperparathyroidism of renal origin: Secondary | ICD-10-CM | POA: Diagnosis not present

## 2020-02-02 DIAGNOSIS — Z992 Dependence on renal dialysis: Secondary | ICD-10-CM | POA: Diagnosis not present

## 2020-02-02 DIAGNOSIS — N186 End stage renal disease: Secondary | ICD-10-CM | POA: Diagnosis not present

## 2020-02-04 DIAGNOSIS — N2581 Secondary hyperparathyroidism of renal origin: Secondary | ICD-10-CM | POA: Diagnosis not present

## 2020-02-04 DIAGNOSIS — Z992 Dependence on renal dialysis: Secondary | ICD-10-CM | POA: Diagnosis not present

## 2020-02-04 DIAGNOSIS — N186 End stage renal disease: Secondary | ICD-10-CM | POA: Diagnosis not present

## 2020-02-07 DIAGNOSIS — N186 End stage renal disease: Secondary | ICD-10-CM | POA: Diagnosis not present

## 2020-02-07 DIAGNOSIS — N2581 Secondary hyperparathyroidism of renal origin: Secondary | ICD-10-CM | POA: Diagnosis not present

## 2020-02-07 DIAGNOSIS — Z992 Dependence on renal dialysis: Secondary | ICD-10-CM | POA: Diagnosis not present

## 2020-02-09 DIAGNOSIS — N186 End stage renal disease: Secondary | ICD-10-CM | POA: Diagnosis not present

## 2020-02-09 DIAGNOSIS — Z992 Dependence on renal dialysis: Secondary | ICD-10-CM | POA: Diagnosis not present

## 2020-02-09 DIAGNOSIS — N2581 Secondary hyperparathyroidism of renal origin: Secondary | ICD-10-CM | POA: Diagnosis not present

## 2020-02-14 ENCOUNTER — Telehealth: Payer: Self-pay | Admitting: Pharmacist

## 2020-02-14 ENCOUNTER — Telehealth: Payer: Self-pay | Admitting: *Deleted

## 2020-02-14 DIAGNOSIS — Z86718 Personal history of other venous thrombosis and embolism: Secondary | ICD-10-CM | POA: Diagnosis not present

## 2020-02-14 DIAGNOSIS — Z7901 Long term (current) use of anticoagulants: Secondary | ICD-10-CM | POA: Diagnosis not present

## 2020-02-14 DIAGNOSIS — N186 End stage renal disease: Secondary | ICD-10-CM | POA: Diagnosis not present

## 2020-02-14 DIAGNOSIS — I2782 Chronic pulmonary embolism: Secondary | ICD-10-CM | POA: Diagnosis not present

## 2020-02-14 DIAGNOSIS — Z23 Encounter for immunization: Secondary | ICD-10-CM | POA: Diagnosis not present

## 2020-02-14 DIAGNOSIS — I120 Hypertensive chronic kidney disease with stage 5 chronic kidney disease or end stage renal disease: Secondary | ICD-10-CM | POA: Diagnosis not present

## 2020-02-14 DIAGNOSIS — N2581 Secondary hyperparathyroidism of renal origin: Secondary | ICD-10-CM | POA: Diagnosis not present

## 2020-02-14 DIAGNOSIS — Z992 Dependence on renal dialysis: Secondary | ICD-10-CM | POA: Diagnosis not present

## 2020-02-14 NOTE — Telephone Encounter (Signed)
INR 2.6  Done 2/26

## 2020-02-14 NOTE — Telephone Encounter (Signed)
Received INR 2.6 (goal 2.0 - 3.0) on 52.5mg  warfarin/wk (1&1/2 x 5mg  daily). Repeat INR in two weeks 15-MAR-21.

## 2020-02-15 DIAGNOSIS — Z7901 Long term (current) use of anticoagulants: Secondary | ICD-10-CM | POA: Diagnosis not present

## 2020-02-15 DIAGNOSIS — I2782 Chronic pulmonary embolism: Secondary | ICD-10-CM | POA: Diagnosis not present

## 2020-02-15 DIAGNOSIS — Z86718 Personal history of other venous thrombosis and embolism: Secondary | ICD-10-CM | POA: Diagnosis not present

## 2020-02-15 NOTE — Telephone Encounter (Signed)
INR 3/2 is  2.4

## 2020-02-16 DIAGNOSIS — N186 End stage renal disease: Secondary | ICD-10-CM | POA: Diagnosis not present

## 2020-02-16 DIAGNOSIS — Z23 Encounter for immunization: Secondary | ICD-10-CM | POA: Diagnosis not present

## 2020-02-16 DIAGNOSIS — Z992 Dependence on renal dialysis: Secondary | ICD-10-CM | POA: Diagnosis not present

## 2020-02-16 DIAGNOSIS — N2581 Secondary hyperparathyroidism of renal origin: Secondary | ICD-10-CM | POA: Diagnosis not present

## 2020-02-17 ENCOUNTER — Encounter (HOSPITAL_COMMUNITY): Payer: Self-pay | Admitting: Interventional Cardiology

## 2020-02-18 DIAGNOSIS — N2581 Secondary hyperparathyroidism of renal origin: Secondary | ICD-10-CM | POA: Diagnosis not present

## 2020-02-18 DIAGNOSIS — Z992 Dependence on renal dialysis: Secondary | ICD-10-CM | POA: Diagnosis not present

## 2020-02-18 DIAGNOSIS — Z23 Encounter for immunization: Secondary | ICD-10-CM | POA: Diagnosis not present

## 2020-02-18 DIAGNOSIS — N186 End stage renal disease: Secondary | ICD-10-CM | POA: Diagnosis not present

## 2020-02-21 DIAGNOSIS — Z992 Dependence on renal dialysis: Secondary | ICD-10-CM | POA: Diagnosis not present

## 2020-02-21 DIAGNOSIS — N186 End stage renal disease: Secondary | ICD-10-CM | POA: Diagnosis not present

## 2020-02-21 DIAGNOSIS — N2581 Secondary hyperparathyroidism of renal origin: Secondary | ICD-10-CM | POA: Diagnosis not present

## 2020-02-21 DIAGNOSIS — Z23 Encounter for immunization: Secondary | ICD-10-CM | POA: Diagnosis not present

## 2020-02-23 DIAGNOSIS — Z992 Dependence on renal dialysis: Secondary | ICD-10-CM | POA: Diagnosis not present

## 2020-02-23 DIAGNOSIS — N186 End stage renal disease: Secondary | ICD-10-CM | POA: Diagnosis not present

## 2020-02-23 DIAGNOSIS — N2581 Secondary hyperparathyroidism of renal origin: Secondary | ICD-10-CM | POA: Diagnosis not present

## 2020-02-23 DIAGNOSIS — Z23 Encounter for immunization: Secondary | ICD-10-CM | POA: Diagnosis not present

## 2020-02-25 DIAGNOSIS — N2581 Secondary hyperparathyroidism of renal origin: Secondary | ICD-10-CM | POA: Diagnosis not present

## 2020-02-25 DIAGNOSIS — N186 End stage renal disease: Secondary | ICD-10-CM | POA: Diagnosis not present

## 2020-02-25 DIAGNOSIS — Z23 Encounter for immunization: Secondary | ICD-10-CM | POA: Diagnosis not present

## 2020-02-25 DIAGNOSIS — Z992 Dependence on renal dialysis: Secondary | ICD-10-CM | POA: Diagnosis not present

## 2020-02-28 ENCOUNTER — Telehealth (HOSPITAL_COMMUNITY): Payer: Self-pay | Admitting: Interventional Cardiology

## 2020-02-28 DIAGNOSIS — Z23 Encounter for immunization: Secondary | ICD-10-CM | POA: Diagnosis not present

## 2020-02-28 DIAGNOSIS — N2581 Secondary hyperparathyroidism of renal origin: Secondary | ICD-10-CM | POA: Diagnosis not present

## 2020-02-28 DIAGNOSIS — N186 End stage renal disease: Secondary | ICD-10-CM | POA: Diagnosis not present

## 2020-02-28 DIAGNOSIS — Z992 Dependence on renal dialysis: Secondary | ICD-10-CM | POA: Diagnosis not present

## 2020-02-28 NOTE — Telephone Encounter (Signed)
Just an FYI. We have made several attempts to contact this patient including sending a letter to schedule or reschedule their echocardiogram. We will be removing the patient from the echo [place].   02/15/20 Will mail letter/LBW 02/16/20  02/09/20 LMOM 2:49pm LBW  2.10.21 @1013  LMOM evd  2.2.21 NO show called to reschedule evd  Thank you

## 2020-03-01 DIAGNOSIS — Z992 Dependence on renal dialysis: Secondary | ICD-10-CM | POA: Diagnosis not present

## 2020-03-01 DIAGNOSIS — N2581 Secondary hyperparathyroidism of renal origin: Secondary | ICD-10-CM | POA: Diagnosis not present

## 2020-03-01 DIAGNOSIS — N186 End stage renal disease: Secondary | ICD-10-CM | POA: Diagnosis not present

## 2020-03-01 DIAGNOSIS — Z23 Encounter for immunization: Secondary | ICD-10-CM | POA: Diagnosis not present

## 2020-03-02 ENCOUNTER — Telehealth: Payer: Self-pay

## 2020-03-02 ENCOUNTER — Telehealth: Payer: Self-pay | Admitting: Pharmacist

## 2020-03-02 NOTE — Telephone Encounter (Signed)
Patient texted me results of his PST FS POC INR performed today, 2.2 on 52.5mg  warfarin/wk. Advised to CONTINUE this same regimen. No bleeding or symptoms of VTE. Repeat PST FS POC INR on 1-APR-21.

## 2020-03-02 NOTE — Telephone Encounter (Signed)
Received a faxed copy of INR results from [facility] PT/INR self testing service for test date/time of 03/03/2019 @ 11:32 AM:  INR 2.2  Will forward to Dr. Elie Confer and place faxed copy in his box. SChaplin, RN,BSN

## 2020-03-02 NOTE — Telephone Encounter (Signed)
Thank you. I agree 

## 2020-03-03 DIAGNOSIS — Z23 Encounter for immunization: Secondary | ICD-10-CM | POA: Diagnosis not present

## 2020-03-03 DIAGNOSIS — N2581 Secondary hyperparathyroidism of renal origin: Secondary | ICD-10-CM | POA: Diagnosis not present

## 2020-03-03 DIAGNOSIS — Z992 Dependence on renal dialysis: Secondary | ICD-10-CM | POA: Diagnosis not present

## 2020-03-03 DIAGNOSIS — N186 End stage renal disease: Secondary | ICD-10-CM | POA: Diagnosis not present

## 2020-03-06 DIAGNOSIS — Z992 Dependence on renal dialysis: Secondary | ICD-10-CM | POA: Diagnosis not present

## 2020-03-06 DIAGNOSIS — N2581 Secondary hyperparathyroidism of renal origin: Secondary | ICD-10-CM | POA: Diagnosis not present

## 2020-03-06 DIAGNOSIS — N186 End stage renal disease: Secondary | ICD-10-CM | POA: Diagnosis not present

## 2020-03-06 DIAGNOSIS — Z23 Encounter for immunization: Secondary | ICD-10-CM | POA: Diagnosis not present

## 2020-03-08 DIAGNOSIS — N2581 Secondary hyperparathyroidism of renal origin: Secondary | ICD-10-CM | POA: Diagnosis not present

## 2020-03-08 DIAGNOSIS — Z23 Encounter for immunization: Secondary | ICD-10-CM | POA: Diagnosis not present

## 2020-03-08 DIAGNOSIS — Z992 Dependence on renal dialysis: Secondary | ICD-10-CM | POA: Diagnosis not present

## 2020-03-08 DIAGNOSIS — N186 End stage renal disease: Secondary | ICD-10-CM | POA: Diagnosis not present

## 2020-03-10 DIAGNOSIS — N2581 Secondary hyperparathyroidism of renal origin: Secondary | ICD-10-CM | POA: Diagnosis not present

## 2020-03-10 DIAGNOSIS — Z992 Dependence on renal dialysis: Secondary | ICD-10-CM | POA: Diagnosis not present

## 2020-03-10 DIAGNOSIS — N186 End stage renal disease: Secondary | ICD-10-CM | POA: Diagnosis not present

## 2020-03-10 DIAGNOSIS — Z23 Encounter for immunization: Secondary | ICD-10-CM | POA: Diagnosis not present

## 2020-03-13 DIAGNOSIS — Z23 Encounter for immunization: Secondary | ICD-10-CM | POA: Diagnosis not present

## 2020-03-13 DIAGNOSIS — N2581 Secondary hyperparathyroidism of renal origin: Secondary | ICD-10-CM | POA: Diagnosis not present

## 2020-03-13 DIAGNOSIS — Z992 Dependence on renal dialysis: Secondary | ICD-10-CM | POA: Diagnosis not present

## 2020-03-13 DIAGNOSIS — N186 End stage renal disease: Secondary | ICD-10-CM | POA: Diagnosis not present

## 2020-03-15 ENCOUNTER — Telehealth: Payer: Self-pay | Admitting: Pharmacist

## 2020-03-15 NOTE — Telephone Encounter (Signed)
Patient texted me results of his PST FS POC INR performed today 2.6 (target goal 2.0 - 3.0) on 52.5mg  warfarin/wk (as 1 and 1/2 x 5mg  daily). Advised to continue this regimen. No bleeding reported. Will perform weekly PST FS POC INR per agreement.

## 2020-03-16 DIAGNOSIS — Z992 Dependence on renal dialysis: Secondary | ICD-10-CM | POA: Diagnosis not present

## 2020-03-16 DIAGNOSIS — N186 End stage renal disease: Secondary | ICD-10-CM | POA: Diagnosis not present

## 2020-03-16 DIAGNOSIS — I120 Hypertensive chronic kidney disease with stage 5 chronic kidney disease or end stage renal disease: Secondary | ICD-10-CM | POA: Diagnosis not present

## 2020-03-20 DIAGNOSIS — N2581 Secondary hyperparathyroidism of renal origin: Secondary | ICD-10-CM | POA: Diagnosis not present

## 2020-03-20 DIAGNOSIS — Z992 Dependence on renal dialysis: Secondary | ICD-10-CM | POA: Diagnosis not present

## 2020-03-20 DIAGNOSIS — E875 Hyperkalemia: Secondary | ICD-10-CM | POA: Diagnosis not present

## 2020-03-20 DIAGNOSIS — Z23 Encounter for immunization: Secondary | ICD-10-CM | POA: Diagnosis not present

## 2020-03-20 DIAGNOSIS — N186 End stage renal disease: Secondary | ICD-10-CM | POA: Diagnosis not present

## 2020-03-22 DIAGNOSIS — N2581 Secondary hyperparathyroidism of renal origin: Secondary | ICD-10-CM | POA: Diagnosis not present

## 2020-03-22 DIAGNOSIS — N186 End stage renal disease: Secondary | ICD-10-CM | POA: Diagnosis not present

## 2020-03-22 DIAGNOSIS — Z23 Encounter for immunization: Secondary | ICD-10-CM | POA: Diagnosis not present

## 2020-03-22 DIAGNOSIS — Z992 Dependence on renal dialysis: Secondary | ICD-10-CM | POA: Diagnosis not present

## 2020-03-22 DIAGNOSIS — E875 Hyperkalemia: Secondary | ICD-10-CM | POA: Diagnosis not present

## 2020-03-24 DIAGNOSIS — N2581 Secondary hyperparathyroidism of renal origin: Secondary | ICD-10-CM | POA: Diagnosis not present

## 2020-03-24 DIAGNOSIS — E875 Hyperkalemia: Secondary | ICD-10-CM | POA: Diagnosis not present

## 2020-03-24 DIAGNOSIS — Z992 Dependence on renal dialysis: Secondary | ICD-10-CM | POA: Diagnosis not present

## 2020-03-24 DIAGNOSIS — Z23 Encounter for immunization: Secondary | ICD-10-CM | POA: Diagnosis not present

## 2020-03-24 DIAGNOSIS — N186 End stage renal disease: Secondary | ICD-10-CM | POA: Diagnosis not present

## 2020-03-25 ENCOUNTER — Other Ambulatory Visit: Payer: Self-pay | Admitting: Radiation Oncology

## 2020-03-27 ENCOUNTER — Telehealth: Payer: Self-pay | Admitting: Pharmacist

## 2020-03-27 DIAGNOSIS — Z23 Encounter for immunization: Secondary | ICD-10-CM | POA: Diagnosis not present

## 2020-03-27 DIAGNOSIS — Z992 Dependence on renal dialysis: Secondary | ICD-10-CM | POA: Diagnosis not present

## 2020-03-27 DIAGNOSIS — E875 Hyperkalemia: Secondary | ICD-10-CM | POA: Diagnosis not present

## 2020-03-27 DIAGNOSIS — N2581 Secondary hyperparathyroidism of renal origin: Secondary | ICD-10-CM | POA: Diagnosis not present

## 2020-03-27 DIAGNOSIS — N186 End stage renal disease: Secondary | ICD-10-CM | POA: Diagnosis not present

## 2020-03-27 NOTE — Telephone Encounter (Signed)
Received INR report from [facility] dated 6-APR-21 delivered to me today. INR 2.3 on 52.5mg  warfarin/wk (1 and 1/2 x 5mg  PO qd). Advised CONTINUE same regimen. PST FS POC INR again on 26-APR-21. Denies any symptoms of bleeding or embolic events.

## 2020-03-29 DIAGNOSIS — N186 End stage renal disease: Secondary | ICD-10-CM | POA: Diagnosis not present

## 2020-03-29 DIAGNOSIS — E875 Hyperkalemia: Secondary | ICD-10-CM | POA: Diagnosis not present

## 2020-03-29 DIAGNOSIS — Z992 Dependence on renal dialysis: Secondary | ICD-10-CM | POA: Diagnosis not present

## 2020-03-29 DIAGNOSIS — Z23 Encounter for immunization: Secondary | ICD-10-CM | POA: Diagnosis not present

## 2020-03-29 DIAGNOSIS — N2581 Secondary hyperparathyroidism of renal origin: Secondary | ICD-10-CM | POA: Diagnosis not present

## 2020-03-30 DIAGNOSIS — Z7901 Long term (current) use of anticoagulants: Secondary | ICD-10-CM | POA: Diagnosis not present

## 2020-03-30 DIAGNOSIS — I2782 Chronic pulmonary embolism: Secondary | ICD-10-CM | POA: Diagnosis not present

## 2020-03-30 DIAGNOSIS — Z86718 Personal history of other venous thrombosis and embolism: Secondary | ICD-10-CM | POA: Diagnosis not present

## 2020-03-31 ENCOUNTER — Telehealth: Payer: Self-pay | Admitting: *Deleted

## 2020-03-31 DIAGNOSIS — E875 Hyperkalemia: Secondary | ICD-10-CM | POA: Diagnosis not present

## 2020-03-31 DIAGNOSIS — N2581 Secondary hyperparathyroidism of renal origin: Secondary | ICD-10-CM | POA: Diagnosis not present

## 2020-03-31 DIAGNOSIS — Z23 Encounter for immunization: Secondary | ICD-10-CM | POA: Diagnosis not present

## 2020-03-31 DIAGNOSIS — Z992 Dependence on renal dialysis: Secondary | ICD-10-CM | POA: Diagnosis not present

## 2020-03-31 DIAGNOSIS — N186 End stage renal disease: Secondary | ICD-10-CM | POA: Diagnosis not present

## 2020-03-31 NOTE — Telephone Encounter (Signed)
INR 2.0 4/15 at 1649

## 2020-04-03 DIAGNOSIS — E875 Hyperkalemia: Secondary | ICD-10-CM | POA: Diagnosis not present

## 2020-04-03 DIAGNOSIS — N186 End stage renal disease: Secondary | ICD-10-CM | POA: Diagnosis not present

## 2020-04-03 DIAGNOSIS — Z23 Encounter for immunization: Secondary | ICD-10-CM | POA: Diagnosis not present

## 2020-04-03 DIAGNOSIS — N2581 Secondary hyperparathyroidism of renal origin: Secondary | ICD-10-CM | POA: Diagnosis not present

## 2020-04-03 DIAGNOSIS — Z992 Dependence on renal dialysis: Secondary | ICD-10-CM | POA: Diagnosis not present

## 2020-04-07 DIAGNOSIS — Z23 Encounter for immunization: Secondary | ICD-10-CM | POA: Diagnosis not present

## 2020-04-07 DIAGNOSIS — N186 End stage renal disease: Secondary | ICD-10-CM | POA: Diagnosis not present

## 2020-04-07 DIAGNOSIS — N2581 Secondary hyperparathyroidism of renal origin: Secondary | ICD-10-CM | POA: Diagnosis not present

## 2020-04-07 DIAGNOSIS — E875 Hyperkalemia: Secondary | ICD-10-CM | POA: Diagnosis not present

## 2020-04-07 DIAGNOSIS — Z992 Dependence on renal dialysis: Secondary | ICD-10-CM | POA: Diagnosis not present

## 2020-04-10 DIAGNOSIS — E875 Hyperkalemia: Secondary | ICD-10-CM | POA: Diagnosis not present

## 2020-04-10 DIAGNOSIS — N186 End stage renal disease: Secondary | ICD-10-CM | POA: Diagnosis not present

## 2020-04-10 DIAGNOSIS — Z992 Dependence on renal dialysis: Secondary | ICD-10-CM | POA: Diagnosis not present

## 2020-04-10 DIAGNOSIS — N2581 Secondary hyperparathyroidism of renal origin: Secondary | ICD-10-CM | POA: Diagnosis not present

## 2020-04-10 DIAGNOSIS — Z23 Encounter for immunization: Secondary | ICD-10-CM | POA: Diagnosis not present

## 2020-04-12 DIAGNOSIS — Z992 Dependence on renal dialysis: Secondary | ICD-10-CM | POA: Diagnosis not present

## 2020-04-12 DIAGNOSIS — E875 Hyperkalemia: Secondary | ICD-10-CM | POA: Diagnosis not present

## 2020-04-12 DIAGNOSIS — N2581 Secondary hyperparathyroidism of renal origin: Secondary | ICD-10-CM | POA: Diagnosis not present

## 2020-04-12 DIAGNOSIS — Z23 Encounter for immunization: Secondary | ICD-10-CM | POA: Diagnosis not present

## 2020-04-12 DIAGNOSIS — N186 End stage renal disease: Secondary | ICD-10-CM | POA: Diagnosis not present

## 2020-04-14 DIAGNOSIS — N2581 Secondary hyperparathyroidism of renal origin: Secondary | ICD-10-CM | POA: Diagnosis not present

## 2020-04-14 DIAGNOSIS — E875 Hyperkalemia: Secondary | ICD-10-CM | POA: Diagnosis not present

## 2020-04-14 DIAGNOSIS — Z23 Encounter for immunization: Secondary | ICD-10-CM | POA: Diagnosis not present

## 2020-04-14 DIAGNOSIS — N186 End stage renal disease: Secondary | ICD-10-CM | POA: Diagnosis not present

## 2020-04-14 DIAGNOSIS — Z992 Dependence on renal dialysis: Secondary | ICD-10-CM | POA: Diagnosis not present

## 2020-04-15 DIAGNOSIS — Z992 Dependence on renal dialysis: Secondary | ICD-10-CM | POA: Diagnosis not present

## 2020-04-15 DIAGNOSIS — I120 Hypertensive chronic kidney disease with stage 5 chronic kidney disease or end stage renal disease: Secondary | ICD-10-CM | POA: Diagnosis not present

## 2020-04-15 DIAGNOSIS — N186 End stage renal disease: Secondary | ICD-10-CM | POA: Diagnosis not present

## 2020-04-18 ENCOUNTER — Encounter: Payer: Medicare Other | Admitting: Radiation Oncology

## 2020-04-19 DIAGNOSIS — N2581 Secondary hyperparathyroidism of renal origin: Secondary | ICD-10-CM | POA: Diagnosis not present

## 2020-04-19 DIAGNOSIS — E875 Hyperkalemia: Secondary | ICD-10-CM | POA: Diagnosis not present

## 2020-04-19 DIAGNOSIS — Z992 Dependence on renal dialysis: Secondary | ICD-10-CM | POA: Diagnosis not present

## 2020-04-19 DIAGNOSIS — N186 End stage renal disease: Secondary | ICD-10-CM | POA: Diagnosis not present

## 2020-04-21 ENCOUNTER — Encounter: Payer: Self-pay | Admitting: *Deleted

## 2020-04-21 ENCOUNTER — Telehealth: Payer: Self-pay

## 2020-04-21 DIAGNOSIS — N2581 Secondary hyperparathyroidism of renal origin: Secondary | ICD-10-CM | POA: Diagnosis not present

## 2020-04-21 DIAGNOSIS — N186 End stage renal disease: Secondary | ICD-10-CM | POA: Diagnosis not present

## 2020-04-21 DIAGNOSIS — Z992 Dependence on renal dialysis: Secondary | ICD-10-CM | POA: Diagnosis not present

## 2020-04-21 DIAGNOSIS — E875 Hyperkalemia: Secondary | ICD-10-CM | POA: Diagnosis not present

## 2020-04-21 NOTE — Telephone Encounter (Signed)
Received TC from patient, states he missed his appt this week with PCP.  States his mother passed away from a massive heart attack and her memorial service was the same time as his appt and he forgot about it.  RN offered condolences.  Pt states he needs refills on oxycodone, but has enough to get him through until next week.  Appt rescheduled for 04/25/20 @ 3:45 w/ PCP. SChaplin, RN,BSN

## 2020-04-24 DIAGNOSIS — E875 Hyperkalemia: Secondary | ICD-10-CM | POA: Diagnosis not present

## 2020-04-24 DIAGNOSIS — Z992 Dependence on renal dialysis: Secondary | ICD-10-CM | POA: Diagnosis not present

## 2020-04-24 DIAGNOSIS — N186 End stage renal disease: Secondary | ICD-10-CM | POA: Diagnosis not present

## 2020-04-24 DIAGNOSIS — N2581 Secondary hyperparathyroidism of renal origin: Secondary | ICD-10-CM | POA: Diagnosis not present

## 2020-04-25 ENCOUNTER — Other Ambulatory Visit: Payer: Self-pay

## 2020-04-25 ENCOUNTER — Encounter: Payer: Self-pay | Admitting: Radiation Oncology

## 2020-04-25 ENCOUNTER — Other Ambulatory Visit: Payer: Self-pay | Admitting: Radiation Oncology

## 2020-04-25 ENCOUNTER — Ambulatory Visit (INDEPENDENT_AMBULATORY_CARE_PROVIDER_SITE_OTHER): Payer: Medicare Other | Admitting: Radiation Oncology

## 2020-04-25 VITALS — BP 120/74 | HR 113 | Temp 97.9°F | Ht 74.0 in | Wt 184.7 lb

## 2020-04-25 DIAGNOSIS — G2581 Restless legs syndrome: Secondary | ICD-10-CM | POA: Diagnosis not present

## 2020-04-25 DIAGNOSIS — Z79891 Long term (current) use of opiate analgesic: Secondary | ICD-10-CM | POA: Diagnosis not present

## 2020-04-25 DIAGNOSIS — N529 Male erectile dysfunction, unspecified: Secondary | ICD-10-CM

## 2020-04-25 MED ORDER — OXYCODONE-ACETAMINOPHEN 10-325 MG PO TABS: 1.0000 | ORAL_TABLET | Freq: Three times a day (TID) | ORAL | 0 refills | Status: DC | PRN

## 2020-04-25 MED ORDER — ROPINIROLE HCL 0.25 MG PO TABS: ORAL_TABLET | ORAL | 0 refills | Status: DC

## 2020-04-25 MED ORDER — ROPINIROLE HCL 2 MG PO TABS: 2.0000 mg | ORAL_TABLET | Freq: Every day | ORAL | 3 refills | Status: DC

## 2020-04-25 NOTE — Patient Instructions (Addendum)
Thank you for coming to your appointment. It was so nice to see you. Today we discussed  Nate was seen today for follow-up and medication refill.  Diagnoses and all orders for this visit:  Long term current use of opiate analgesic -     Drug Screen 10 w/Conf, WB -     oxyCODONE-acetaminophen (PERCOCET) 10-325 MG tablet; Take 1 tablet by mouth every 8 (eight) hours as needed for pain. -     oxyCODONE-acetaminophen (PERCOCET) 10-325 MG tablet; Take 1 tablet by mouth every 8 (eight) hours as needed for pain. -     oxyCODONE-acetaminophen (PERCOCET) 10-325 MG tablet; Take 1 tablet by mouth every 8 (eight) hours as needed for pain.  Restless leg syndrome -     rOPINIRole (REQUIP) 0.25 MG tablet; Take 2 tablets (0.5 mg total) by mouth at bedtime for 2 days, THEN 3 tablets (0.75 mg total) at bedtime for 2 days, THEN 4 tablets (1 mg total) at bedtime for 2 days, THEN 5 tablets (1.25 mg total) at bedtime for 2 days, THEN 6 tablets (1.5 mg total) at bedtime for 2 days, THEN 7 tablets (1.75 mg total) at bedtime for 2 days, THEN 8 tablets (2 mg total) at bedtime for 2 days. -     rOPINIRole (REQUIP) 2 MG tablet; Take 1 tablet (2 mg total) by mouth at bedtime.     If labs were drawn today, I will call you with any abnormal results. Otherwise, please keep up the good work. I look forward to seeing you again soon at your follow up in 3 months.  Sincerely,  Al Decant, MD
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
## 2020-04-25 NOTE — Assessment & Plan Note (Signed)
This is chronic and uncontrolled.  Patient with history of restless leg syndrome.  Was prescribed Requip 0.25 mg with only very mild relief in symptoms.  He denies any symptoms from his current dose.  He is interested in going up.  Iron levels closely monitored by nephrology as patient on dialysis.  Plan: -Increase Requip up to 2 mgs per day with 0.25 mg increase every 2 to 3 days -Follow-up in 3 months

## 2020-04-28 DIAGNOSIS — Z992 Dependence on renal dialysis: Secondary | ICD-10-CM | POA: Diagnosis not present

## 2020-04-28 DIAGNOSIS — N2581 Secondary hyperparathyroidism of renal origin: Secondary | ICD-10-CM | POA: Diagnosis not present

## 2020-04-28 DIAGNOSIS — E875 Hyperkalemia: Secondary | ICD-10-CM | POA: Diagnosis not present

## 2020-04-28 DIAGNOSIS — N186 End stage renal disease: Secondary | ICD-10-CM | POA: Diagnosis not present

## 2020-05-01 ENCOUNTER — Telehealth: Payer: Self-pay | Admitting: *Deleted

## 2020-05-01 ENCOUNTER — Telehealth: Payer: Self-pay | Admitting: Pharmacist

## 2020-05-01 DIAGNOSIS — E875 Hyperkalemia: Secondary | ICD-10-CM | POA: Diagnosis not present

## 2020-05-01 DIAGNOSIS — N2581 Secondary hyperparathyroidism of renal origin: Secondary | ICD-10-CM | POA: Diagnosis not present

## 2020-05-01 DIAGNOSIS — Z992 Dependence on renal dialysis: Secondary | ICD-10-CM | POA: Diagnosis not present

## 2020-05-01 DIAGNOSIS — N186 End stage renal disease: Secondary | ICD-10-CM | POA: Diagnosis not present

## 2020-05-01 NOTE — Telephone Encounter (Signed)
Patient texted me results of PST FS POC INR 2.4 (goal 2.0 - 3.0) on 57.5mg  warfarin/wk. Was instructed to CONTINUE same regimen (2x5mg  on M/Th; 1&/2 x 5mg  (7.5mg ) all other days. Repeat INR 31-MAY-21. No bleeding reported.

## 2020-05-01 NOTE — Telephone Encounter (Signed)
INR 05/01/2020 2.4

## 2020-05-01 NOTE — Telephone Encounter (Signed)
Hi Brian [name]:  Patient had texted me results and I have entered my interaction with patient as a phone encounter. Thank you!

## 2020-05-02 NOTE — Telephone Encounter (Signed)
Agreed, thanks

## 2020-05-03 DIAGNOSIS — N186 End stage renal disease: Secondary | ICD-10-CM | POA: Diagnosis not present

## 2020-05-03 DIAGNOSIS — Z992 Dependence on renal dialysis: Secondary | ICD-10-CM | POA: Diagnosis not present

## 2020-05-03 DIAGNOSIS — N2581 Secondary hyperparathyroidism of renal origin: Secondary | ICD-10-CM | POA: Diagnosis not present

## 2020-05-03 DIAGNOSIS — E875 Hyperkalemia: Secondary | ICD-10-CM | POA: Diagnosis not present

## 2020-05-04 LAB — THC,MS,WB/SP RFX
Cannabidiol: NEGATIVE ng/mL
Cannabinoid Confirmation: POSITIVE
Cannabinol: NEGATIVE ng/mL
Carboxy-THC: 13.7 ng/mL
Hydroxy-THC: 1.2 ng/mL
Tetrahydrocannabinol(THC): 1.3 ng/mL

## 2020-05-04 LAB — DRUG SCREEN 10 W/CONF, WB
Amphetamines, IA: NEGATIVE ng/mL
Barbiturates, IA: NEGATIVE ug/mL
Benzodiazepines, IA: NEGATIVE ng/mL
Cocaine/Metabolite, IA: NEGATIVE ng/mL
Methadone, IA: NEGATIVE ng/mL
Opiates, IA: NEGATIVE ng/mL
Oxycodones, IA: POSITIVE ng/mL — AB
Phencyclidine, IA: NEGATIVE ng/mL
Propoxyphene, IA: NEGATIVE ng/mL
THC (Marijuana) Mtb, IA: POSITIVE ng/mL — AB

## 2020-05-04 LAB — OXYCODONES,MS,WB/SP RFX
Oxycocone: 6.7 ng/mL
Oxycodones Confirmation: POSITIVE
Oxymorphone: NEGATIVE ng/mL

## 2020-05-04 NOTE — Progress Notes (Signed)
Internal Medicine Clinic Attending  Case discussed with Dr. Lanierat the time of the visit.  We reviewed the resident's history and exam and pertinent patient test results.  I agree with the assessment, diagnosis, and plan of care documented in the resident's note.   

## 2020-05-07 ENCOUNTER — Other Ambulatory Visit: Payer: Self-pay

## 2020-05-07 ENCOUNTER — Inpatient Hospital Stay (HOSPITAL_COMMUNITY)
Admission: EM | Admit: 2020-05-07 | Discharge: 2020-05-11 | DRG: 157 | Disposition: A | Discharge status: Home / Self Care | Payer: Medicare Other | Attending: Internal Medicine | Admitting: Internal Medicine

## 2020-05-07 ENCOUNTER — Emergency Department (HOSPITAL_COMMUNITY): Payer: Medicare Other

## 2020-05-07 ENCOUNTER — Encounter (HOSPITAL_COMMUNITY): Payer: Self-pay | Admitting: Emergency Medicine

## 2020-05-07 DIAGNOSIS — E872 Acidosis, unspecified: Secondary | ICD-10-CM

## 2020-05-07 DIAGNOSIS — Z992 Dependence on renal dialysis: Secondary | ICD-10-CM

## 2020-05-07 DIAGNOSIS — M549 Dorsalgia, unspecified: Secondary | ICD-10-CM | POA: Diagnosis present

## 2020-05-07 DIAGNOSIS — R7989 Other specified abnormal findings of blood chemistry: Secondary | ICD-10-CM

## 2020-05-07 DIAGNOSIS — N2581 Secondary hyperparathyroidism of renal origin: Secondary | ICD-10-CM | POA: Diagnosis present

## 2020-05-07 DIAGNOSIS — K219 Gastro-esophageal reflux disease without esophagitis: Secondary | ICD-10-CM | POA: Diagnosis present

## 2020-05-07 DIAGNOSIS — Z560 Unemployment, unspecified: Secondary | ICD-10-CM

## 2020-05-07 DIAGNOSIS — E875 Hyperkalemia: Secondary | ICD-10-CM | POA: Diagnosis not present

## 2020-05-07 DIAGNOSIS — R000 Tachycardia, unspecified: Secondary | ICD-10-CM | POA: Diagnosis not present

## 2020-05-07 DIAGNOSIS — Z91048 Other nonmedicinal substance allergy status: Secondary | ICD-10-CM

## 2020-05-07 DIAGNOSIS — I509 Heart failure, unspecified: Secondary | ICD-10-CM | POA: Diagnosis not present

## 2020-05-07 DIAGNOSIS — F1010 Alcohol abuse, uncomplicated: Secondary | ICD-10-CM | POA: Diagnosis present

## 2020-05-07 DIAGNOSIS — Z21 Asymptomatic human immunodeficiency virus [HIV] infection status: Secondary | ICD-10-CM | POA: Diagnosis present

## 2020-05-07 DIAGNOSIS — Q613 Polycystic kidney, unspecified: Secondary | ICD-10-CM

## 2020-05-07 DIAGNOSIS — M898X9 Other specified disorders of bone, unspecified site: Secondary | ICD-10-CM | POA: Diagnosis present

## 2020-05-07 DIAGNOSIS — F17210 Nicotine dependence, cigarettes, uncomplicated: Secondary | ICD-10-CM | POA: Diagnosis present

## 2020-05-07 DIAGNOSIS — K122 Cellulitis and abscess of mouth: Secondary | ICD-10-CM | POA: Diagnosis present

## 2020-05-07 DIAGNOSIS — J029 Acute pharyngitis, unspecified: Secondary | ICD-10-CM | POA: Diagnosis not present

## 2020-05-07 DIAGNOSIS — Y92230 Patient room in hospital as the place of occurrence of the external cause: Secondary | ICD-10-CM | POA: Diagnosis not present

## 2020-05-07 DIAGNOSIS — D631 Anemia in chronic kidney disease: Secondary | ICD-10-CM | POA: Diagnosis not present

## 2020-05-07 DIAGNOSIS — D61811 Other drug-induced pancytopenia: Secondary | ICD-10-CM | POA: Diagnosis not present

## 2020-05-07 DIAGNOSIS — G2581 Restless legs syndrome: Secondary | ICD-10-CM | POA: Diagnosis present

## 2020-05-07 DIAGNOSIS — R0602 Shortness of breath: Secondary | ICD-10-CM | POA: Diagnosis not present

## 2020-05-07 DIAGNOSIS — Y903 Blood alcohol level of 60-79 mg/100 ml: Secondary | ICD-10-CM | POA: Diagnosis present

## 2020-05-07 DIAGNOSIS — Z8249 Family history of ischemic heart disease and other diseases of the circulatory system: Secondary | ICD-10-CM

## 2020-05-07 DIAGNOSIS — R011 Cardiac murmur, unspecified: Secondary | ICD-10-CM | POA: Diagnosis not present

## 2020-05-07 DIAGNOSIS — I132 Hypertensive heart and chronic kidney disease with heart failure and with stage 5 chronic kidney disease, or end stage renal disease: Secondary | ICD-10-CM | POA: Diagnosis not present

## 2020-05-07 DIAGNOSIS — N186 End stage renal disease: Secondary | ICD-10-CM

## 2020-05-07 DIAGNOSIS — I712 Thoracic aortic aneurysm, without rupture: Secondary | ICD-10-CM | POA: Diagnosis present

## 2020-05-07 DIAGNOSIS — J449 Chronic obstructive pulmonary disease, unspecified: Secondary | ICD-10-CM | POA: Diagnosis present

## 2020-05-07 DIAGNOSIS — Z905 Acquired absence of kidney: Secondary | ICD-10-CM

## 2020-05-07 DIAGNOSIS — Z86711 Personal history of pulmonary embolism: Secondary | ICD-10-CM

## 2020-05-07 DIAGNOSIS — G8929 Other chronic pain: Secondary | ICD-10-CM | POA: Diagnosis present

## 2020-05-07 DIAGNOSIS — Z79899 Other long term (current) drug therapy: Secondary | ICD-10-CM

## 2020-05-07 DIAGNOSIS — Z20822 Contact with and (suspected) exposure to covid-19: Secondary | ICD-10-CM | POA: Diagnosis not present

## 2020-05-07 DIAGNOSIS — Z86718 Personal history of other venous thrombosis and embolism: Secondary | ICD-10-CM

## 2020-05-07 DIAGNOSIS — N250 Renal osteodystrophy: Secondary | ICD-10-CM | POA: Diagnosis not present

## 2020-05-07 DIAGNOSIS — R079 Chest pain, unspecified: Secondary | ICD-10-CM | POA: Diagnosis not present

## 2020-05-07 DIAGNOSIS — Z87442 Personal history of urinary calculi: Secondary | ICD-10-CM

## 2020-05-07 DIAGNOSIS — Z9115 Patient's noncompliance with renal dialysis: Secondary | ICD-10-CM

## 2020-05-07 DIAGNOSIS — R791 Abnormal coagulation profile: Secondary | ICD-10-CM

## 2020-05-07 DIAGNOSIS — T368X5A Adverse effect of other systemic antibiotics, initial encounter: Secondary | ICD-10-CM | POA: Diagnosis not present

## 2020-05-07 DIAGNOSIS — Z7901 Long term (current) use of anticoagulants: Secondary | ICD-10-CM

## 2020-05-07 DIAGNOSIS — R778 Other specified abnormalities of plasma proteins: Secondary | ICD-10-CM

## 2020-05-07 LAB — COMPREHENSIVE METABOLIC PANEL
ALT: 13 U/L (ref 0–44)
AST: 24 U/L (ref 15–41)
Albumin: 3.9 g/dL (ref 3.5–5.0)
Alkaline Phosphatase: 72 U/L (ref 38–126)
Anion gap: 25 — ABNORMAL HIGH (ref 5–15)
BUN: 59 mg/dL — ABNORMAL HIGH (ref 6–20)
CO2: 18 mmol/L — ABNORMAL LOW (ref 22–32)
Calcium: 8.7 mg/dL — ABNORMAL LOW (ref 8.9–10.3)
Chloride: 90 mmol/L — ABNORMAL LOW (ref 98–111)
Creatinine, Ser: 13.56 mg/dL — ABNORMAL HIGH (ref 0.61–1.24)
GFR calc Af Amer: 4 mL/min — ABNORMAL LOW (ref >60)
GFR calc non Af Amer: 4 mL/min — ABNORMAL LOW (ref >60)
Glucose, Bld: 61 mg/dL — ABNORMAL LOW (ref 70–99)
Potassium: 5.4 mmol/L — ABNORMAL HIGH (ref 3.5–5.1)
Sodium: 133 mmol/L — ABNORMAL LOW (ref 135–145)
Total Bilirubin: 1.2 mg/dL (ref 0.3–1.2)
Total Protein: 7.4 g/dL (ref 6.5–8.1)

## 2020-05-07 LAB — ETHANOL: Alcohol, Ethyl (B): 64 mg/dL — ABNORMAL HIGH (ref <10)

## 2020-05-07 LAB — CBC WITH DIFFERENTIAL/PLATELET
Abs Immature Granulocytes: 0.01 10*3/uL (ref 0.00–0.07)
Basophils Absolute: 0 10*3/uL (ref 0.0–0.1)
Basophils Relative: 0 %
Eosinophils Absolute: 0 10*3/uL (ref 0.0–0.5)
Eosinophils Relative: 1 %
HCT: 41.3 % (ref 39.0–52.0)
Hemoglobin: 13.9 g/dL (ref 13.0–17.0)
Immature Granulocytes: 0 %
Lymphocytes Relative: 33 %
Lymphs Abs: 1.5 10*3/uL (ref 0.7–4.0)
MCH: 37.1 pg — ABNORMAL HIGH (ref 26.0–34.0)
MCHC: 33.7 g/dL (ref 30.0–36.0)
MCV: 110.1 fL — ABNORMAL HIGH (ref 80.0–100.0)
Monocytes Absolute: 0.4 10*3/uL (ref 0.1–1.0)
Monocytes Relative: 8 %
Neutro Abs: 2.6 10*3/uL (ref 1.7–7.7)
Neutrophils Relative %: 58 %
Platelets: 133 10*3/uL — ABNORMAL LOW (ref 150–400)
RBC: 3.75 MIL/uL — ABNORMAL LOW (ref 4.22–5.81)
RDW: 14.5 % (ref 11.5–15.5)
WBC: 4.5 10*3/uL (ref 4.0–10.5)
nRBC: 0 % (ref 0.0–0.2)

## 2020-05-07 LAB — PROTIME-INR
INR: 4.8 (ref 0.8–1.2)
Prothrombin Time: 43.8 seconds — ABNORMAL HIGH (ref 11.4–15.2)

## 2020-05-07 LAB — LACTIC ACID, PLASMA
Lactic Acid, Venous: 4.3 mmol/L (ref 0.5–1.9)
Lactic Acid, Venous: 1.9 mmol/L (ref 0.5–1.9)

## 2020-05-07 LAB — TROPONIN I (HIGH SENSITIVITY)
Troponin I (High Sensitivity): 83 ng/L — ABNORMAL HIGH (ref <18)
Troponin I (High Sensitivity): 92 ng/L — ABNORMAL HIGH (ref <18)

## 2020-05-07 LAB — SARS CORONAVIRUS 2 BY RT PCR (HOSPITAL ORDER, PERFORMED IN ~~LOC~~ HOSPITAL LAB): SARS Coronavirus 2: NEGATIVE

## 2020-05-07 LAB — GROUP A STREP BY PCR: Group A Strep by PCR: NOT DETECTED

## 2020-05-07 MED ORDER — NICOTINE 21 MG/24HR TD PT24
21.0000 mg | MEDICATED_PATCH | Freq: Every day | TRANSDERMAL | Status: DC
  Administered 2020-05-07 – 2020-05-11 (×5): 21 mg via TRANSDERMAL
  Filled 2020-05-07 (×5): qty 1

## 2020-05-07 MED ORDER — DIPHENHYDRAMINE HCL 50 MG/ML IJ SOLN
25.0000 mg | Freq: Once | INTRAMUSCULAR | Status: AC
  Administered 2020-05-07: 25 mg via INTRAVENOUS
  Filled 2020-05-07: qty 1

## 2020-05-07 MED ORDER — SODIUM CHLORIDE 0.9 % IV BOLUS
500.0000 mL | Freq: Once | INTRAVENOUS | Status: AC
  Administered 2020-05-07: 500 mL via INTRAVENOUS

## 2020-05-07 MED ORDER — OXYCODONE HCL 5 MG PO TABS
5.0000 mg | ORAL_TABLET | Freq: Three times a day (TID) | ORAL | Status: DC | PRN
  Administered 2020-05-07 – 2020-05-11 (×9): 5 mg via ORAL
  Filled 2020-05-07 (×9): qty 1

## 2020-05-07 MED ORDER — IOHEXOL 350 MG/ML SOLN
75.0000 mL | Freq: Once | INTRAVENOUS | Status: AC | PRN
  Administered 2020-05-07: 75 mL via INTRAVENOUS

## 2020-05-07 MED ORDER — SODIUM CHLORIDE 0.9 % IV BOLUS: 1000.0000 mL | Freq: Once | INTRAVENOUS | Status: DC

## 2020-05-07 MED ORDER — CLINDAMYCIN PHOSPHATE 600 MG/50ML IV SOLN
600.0000 mg | Freq: Once | INTRAVENOUS | Status: AC
  Administered 2020-05-07: 600 mg via INTRAVENOUS
  Filled 2020-05-07: qty 50

## 2020-05-07 MED ORDER — OXYCODONE-ACETAMINOPHEN 10-325 MG PO TABS: 1.0000 | ORAL_TABLET | Freq: Three times a day (TID) | ORAL | Status: DC | PRN

## 2020-05-07 MED ORDER — THIAMINE HCL 100 MG PO TABS
100.0000 mg | ORAL_TABLET | Freq: Every day | ORAL | Status: DC
  Administered 2020-05-07 – 2020-05-11 (×5): 100 mg via ORAL
  Filled 2020-05-07 (×5): qty 1

## 2020-05-07 MED ORDER — DRONABINOL 2.5 MG PO CAPS
5.0000 mg | ORAL_CAPSULE | Freq: Every day | ORAL | Status: DC
  Administered 2020-05-08 – 2020-05-11 (×4): 5 mg via ORAL
  Filled 2020-05-07 (×4): qty 2

## 2020-05-07 MED ORDER — SODIUM ZIRCONIUM CYCLOSILICATE 10 G PO PACK
10.0000 g | PACK | Freq: Once | ORAL | Status: AC
  Administered 2020-05-07: 10 g via ORAL
  Filled 2020-05-07: qty 1

## 2020-05-07 MED ORDER — METHYLPREDNISOLONE SODIUM SUCC 125 MG IJ SOLR
125.0000 mg | Freq: Once | INTRAMUSCULAR | Status: AC
  Administered 2020-05-07: 125 mg via INTRAVENOUS
  Filled 2020-05-07: qty 2

## 2020-05-07 MED ORDER — ROPINIROLE HCL 1 MG PO TABS: 2.0000 mg | ORAL_TABLET | Freq: Every day | ORAL | Status: DC

## 2020-05-07 MED ORDER — LACTULOSE 10 GM/15ML PO SOLN
20.0000 g | Freq: Every day | ORAL | Status: DC | PRN
  Filled 2020-05-07: qty 30

## 2020-05-07 MED ORDER — LORAZEPAM 2 MG/ML IJ SOLN
1.0000 mg | Freq: Once | INTRAMUSCULAR | Status: AC
  Administered 2020-05-07: 1 mg via INTRAVENOUS
  Filled 2020-05-07: qty 1

## 2020-05-07 MED ORDER — FOLIC ACID 1 MG PO TABS
1.0000 mg | ORAL_TABLET | Freq: Every day | ORAL | Status: DC
  Administered 2020-05-07 – 2020-05-11 (×5): 1 mg via ORAL
  Filled 2020-05-07 (×5): qty 1

## 2020-05-07 MED ORDER — ALBUTEROL SULFATE (2.5 MG/3ML) 0.083% IN NEBU: 2.5000 mg | INHALATION_SOLUTION | Freq: Four times a day (QID) | RESPIRATORY_TRACT | Status: DC | PRN

## 2020-05-07 MED ORDER — EMTRICITABINE-TENOFOVIR AF 200-25 MG PO TABS
1.0000 | ORAL_TABLET | Freq: Every day | ORAL | Status: DC
  Administered 2020-05-07 – 2020-05-08 (×2): 1 via ORAL
  Filled 2020-05-07 (×3): qty 1

## 2020-05-07 MED ORDER — DOLUTEGRAVIR SODIUM 50 MG PO TABS
50.0000 mg | ORAL_TABLET | Freq: Every day | ORAL | Status: DC
  Administered 2020-05-07 – 2020-05-08 (×2): 50 mg via ORAL
  Filled 2020-05-07 (×3): qty 1

## 2020-05-07 MED ORDER — OXYCODONE-ACETAMINOPHEN 5-325 MG PO TABS
1.0000 | ORAL_TABLET | Freq: Three times a day (TID) | ORAL | Status: DC | PRN
  Administered 2020-05-07 – 2020-05-11 (×9): 1 via ORAL
  Filled 2020-05-07 (×9): qty 1
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
## 2020-05-07 NOTE — ED Notes (Signed)
Pt states unable to give urine sample d/t anuria r/t HD

## 2020-05-07 NOTE — ED Triage Notes (Signed)
C/o sore throat x 1 week.  Reports heavy ETOH use yesterday.  This morning it felt like something "caught in throat".  States it felt like phlegm but he coughed up bright red blood.

## 2020-05-07 NOTE — ED Notes (Signed)
Pt receives dialysis MWF, missed Friday tx d/t overslept.
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
## 2020-05-08 DIAGNOSIS — K122 Cellulitis and abscess of mouth: Principal | ICD-10-CM

## 2020-05-08 LAB — CBC
HCT: 34.3 % — ABNORMAL LOW (ref 39.0–52.0)
Hemoglobin: 11.8 g/dL — ABNORMAL LOW (ref 13.0–17.0)
MCH: 37.8 pg — ABNORMAL HIGH (ref 26.0–34.0)
MCHC: 34.4 g/dL (ref 30.0–36.0)
MCV: 109.9 fL — ABNORMAL HIGH (ref 80.0–100.0)
Platelets: 96 10*3/uL — ABNORMAL LOW (ref 150–400)
RBC: 3.12 MIL/uL — ABNORMAL LOW (ref 4.22–5.81)
RDW: 14.5 % (ref 11.5–15.5)
WBC: 1.2 10*3/uL — CL (ref 4.0–10.5)
nRBC: 0 % (ref 0.0–0.2)

## 2020-05-08 LAB — RENAL FUNCTION PANEL
Albumin: 3.6 g/dL (ref 3.5–5.0)
Anion gap: 22 — ABNORMAL HIGH (ref 5–15)
BUN: 76 mg/dL — ABNORMAL HIGH (ref 6–20)
CO2: 18 mmol/L — ABNORMAL LOW (ref 22–32)
Calcium: 8.1 mg/dL — ABNORMAL LOW (ref 8.9–10.3)
Chloride: 88 mmol/L — ABNORMAL LOW (ref 98–111)
Creatinine, Ser: 14.92 mg/dL — ABNORMAL HIGH (ref 0.61–1.24)
GFR calc Af Amer: 4 mL/min — ABNORMAL LOW (ref >60)
GFR calc non Af Amer: 3 mL/min — ABNORMAL LOW (ref >60)
Glucose, Bld: 157 mg/dL — ABNORMAL HIGH (ref 70–99)
Phosphorus: 8.1 mg/dL — ABNORMAL HIGH (ref 2.5–4.6)
Potassium: 5.3 mmol/L — ABNORMAL HIGH (ref 3.5–5.1)
Sodium: 128 mmol/L — ABNORMAL LOW (ref 135–145)

## 2020-05-08 LAB — CBC WITH DIFFERENTIAL/PLATELET
Abs Immature Granulocytes: 0.01 10*3/uL (ref 0.00–0.07)
Basophils Absolute: 0 10*3/uL (ref 0.0–0.1)
Basophils Relative: 0 %
Eosinophils Absolute: 0 10*3/uL (ref 0.0–0.5)
Eosinophils Relative: 0 %
HCT: 34.4 % — ABNORMAL LOW (ref 39.0–52.0)
Hemoglobin: 11.6 g/dL — ABNORMAL LOW (ref 13.0–17.0)
Immature Granulocytes: 1 %
Lymphocytes Relative: 11 %
Lymphs Abs: 0.2 10*3/uL — ABNORMAL LOW (ref 0.7–4.0)
MCH: 36.9 pg — ABNORMAL HIGH (ref 26.0–34.0)
MCHC: 33.7 g/dL (ref 30.0–36.0)
MCV: 109.6 fL — ABNORMAL HIGH (ref 80.0–100.0)
Monocytes Absolute: 0.1 10*3/uL (ref 0.1–1.0)
Monocytes Relative: 5 %
Neutro Abs: 1.6 10*3/uL — ABNORMAL LOW (ref 1.7–7.7)
Neutrophils Relative %: 83 %
Platelets: 105 10*3/uL — ABNORMAL LOW (ref 150–400)
RBC: 3.14 MIL/uL — ABNORMAL LOW (ref 4.22–5.81)
RDW: 14.4 % (ref 11.5–15.5)
WBC: 2 10*3/uL — ABNORMAL LOW (ref 4.0–10.5)
nRBC: 0 % (ref 0.0–0.2)

## 2020-05-08 LAB — HEPATITIS B SURFACE ANTIBODY,QUALITATIVE: Hep B S Ab: REACTIVE — AB

## 2020-05-08 LAB — PROTIME-INR
INR: 6.3 (ref 0.8–1.2)
Prothrombin Time: 54 seconds — ABNORMAL HIGH (ref 11.4–15.2)

## 2020-05-08 LAB — HEPATITIS B SURFACE ANTIGEN: Hepatitis B Surface Ag: NONREACTIVE

## 2020-05-08 MED ORDER — DOLUTEGRAVIR SODIUM 50 MG PO TABS
50.0000 mg | ORAL_TABLET | Freq: Every day | ORAL | Status: DC
  Administered 2020-05-10 (×2): 50 mg via ORAL
  Filled 2020-05-08 (×5): qty 1

## 2020-05-08 MED ORDER — SODIUM CHLORIDE 0.9 % IV SOLN
2.0000 g | INTRAVENOUS | Status: DC
  Administered 2020-05-08 – 2020-05-11 (×4): 2 g via INTRAVENOUS
  Filled 2020-05-08 (×4): qty 20

## 2020-05-08 MED ORDER — EMTRICITABINE-TENOFOVIR AF 200-25 MG PO TABS
1.0000 | ORAL_TABLET | Freq: Every day | ORAL | Status: DC
  Administered 2020-05-09 – 2020-05-10 (×2): 1 via ORAL
  Filled 2020-05-08 (×3): qty 1

## 2020-05-08 MED ORDER — SODIUM CHLORIDE 0.9 % IV SOLN: 100.0000 mL | INTRAVENOUS | Status: DC | PRN

## 2020-05-08 MED ORDER — LIDOCAINE-PRILOCAINE 2.5-2.5 % EX CREA: 1.0000 "application " | TOPICAL_CREAM | CUTANEOUS | Status: DC | PRN

## 2020-05-08 MED ORDER — LIDOCAINE HCL (PF) 1 % IJ SOLN: 5.0000 mL | INTRAMUSCULAR | Status: DC | PRN

## 2020-05-08 MED ORDER — HEPARIN SODIUM (PORCINE) 1000 UNIT/ML IJ SOLN
INTRAMUSCULAR | Status: AC
  Filled 2020-05-08: qty 5

## 2020-05-08 MED ORDER — ALTEPLASE 2 MG IJ SOLR: 2.0000 mg | Freq: Once | INTRAMUSCULAR | Status: DC | PRN

## 2020-05-08 MED ORDER — HEPARIN SODIUM (PORCINE) 1000 UNIT/ML DIALYSIS: 1000.0000 [IU] | INTRAMUSCULAR | Status: DC | PRN

## 2020-05-08 MED ORDER — HEPARIN SODIUM (PORCINE) 1000 UNIT/ML DIALYSIS
5000.0000 [IU] | Freq: Once | INTRAMUSCULAR | Status: AC
  Administered 2020-05-08: 5000 [IU] via INTRAVENOUS_CENTRAL

## 2020-05-08 MED ORDER — PENTAFLUOROPROP-TETRAFLUOROETH EX AERO: 1.0000 "application " | INHALATION_SPRAY | CUTANEOUS | Status: DC | PRN

## 2020-05-08 NOTE — Plan of Care (Signed)
  Problem: Education: Goal: Knowledge of disease and its progression will improve Outcome: Progressing Goal: Individualized Educational Video(s) Outcome: Progressing   Problem: Fluid Volume: Goal: Compliance with measures to maintain balanced fluid volume will improve Outcome: Progressing   Problem: Health Behavior/Discharge Planning: Goal: Ability to manage health-related needs will improve Outcome: Progressing   Problem: Nutritional: Goal: Ability to make healthy dietary choices will improve Outcome: Progressing   Problem: Clinical Measurements: Goal: Complications related to the disease process, condition or treatment will be avoided or minimized Outcome: Progressing   Problem: Education: Goal: Knowledge of General Education information will improve Description: Including pain rating scale, medication(s)/side effects and non-pharmacologic comfort measures Outcome: Progressing   Problem: Health Behavior/Discharge Planning: Goal: Ability to manage health-related needs will improve Outcome: Progressing   Problem: Clinical Measurements: Goal: Ability to maintain clinical measurements within normal limits will improve Outcome: Progressing Goal: Will remain free from infection Outcome: Progressing Goal: Diagnostic test results will improve Outcome: Progressing Goal: Respiratory complications will improve Outcome: Progressing Goal: Cardiovascular complication will be avoided Outcome: Progressing   Problem: Activity: Goal: Risk for activity intolerance will decrease Outcome: Progressing   Problem: Nutrition: Goal: Adequate nutrition will be maintained Outcome: Progressing   Problem: Coping: Goal: Level of anxiety will decrease Outcome: Progressing   Problem: Elimination: Goal: Will not experience complications related to bowel motility Outcome: Progressing Goal: Will not experience complications related to urinary retention Outcome: Progressing   Problem: Pain  Managment: Goal: General experience of comfort will improve Outcome: Progressing   Problem: Safety: Goal: Ability to remain free from injury will improve Outcome: Progressing   Problem: Skin Integrity: Goal: Risk for impaired skin integrity will decrease Outcome: Progressing   

## 2020-05-08 NOTE — Plan of Care (Signed)
  Problem: Fluid Volume: Goal: Compliance with measures to maintain balanced fluid volume will improve Outcome: Progressing   Problem: Education: Goal: Knowledge of General Education information will improve Description: Including pain rating scale, medication(s)/side effects and non-pharmacologic comfort measures Outcome: Progressing

## 2020-05-08 NOTE — Progress Notes (Signed)
Subjective:  Seen on hd , tolerating uf , swallowing better ,Bur still cos something stuck in Throat= admit team RX  on Iv antibiotics   For Uvulitis   Objective Vital signs in last 24 hours: Vitals:   05/08/20 0700 05/08/20 0712 05/08/20 0730 05/08/20 0800  BP: 134/84 138/80 133/81 134/81  Pulse: (!) 106 (!) 107 100 (!) 102  Resp: 16 16 16 16   Temp: 97.9 F (36.6 C)     TempSrc: Oral     SpO2:      Weight: 85 kg     Height:       Weight change:   Physical Exam: General: on hd , thin, Chronically ill appearing male, nad   Lungs: CTA,  Breathing is unlabored. Heart: Tachy, regular rhythm. No m, r,g  Abdomen: soft non-tender , no ascites  Extremities: No sig LE edema . Dialysis Access: LUE AVF patent on hd    OP Dialysis Orders:  GO MWF 4h 200NRe 500/800 EDW 78.5kg 2K/2.5Ca UFP 1 AVF Hep 5000  Calcitriol 2.0   Problem/Plan 1. ESRD -  Missed HD Friday  5/21. Presents today w CP/SOB,cough,  mild hyperkalemia. CXR clear. CT angio neg for PE. k 5.4  Admit [place]   , hd today   2. Hypertension/volume  - Hypertensive on admission = improved with UF today so far . BP usually controlled as outpatient. Up 5kgs by weights. UF to EDW as tolerated.  3. Anemia  - Hgb >13 No ESA needs.  4. Metabolic bone disease -  Continue Auyrxia binder/Calcitriol 5. Hx DVT/PE - on warfarin as op- INR  4.8 admit / plan per admit team  6. Uvulitis- wu /rx per admit IV antibio  per admit  7. ETOH abuse - per admit CIWA protocol  [name], PA-C [place] [phone] 05/08/2020,9:36 AM  LOS: 1 day   Labs: Basic Metabolic Panel: Recent Labs  Lab 05/07/20 1240  NA 133*  K 5.4*  CL 90*  CO2 18*  GLUCOSE 61*  BUN 59*  CREATININE 13.56*  CALCIUM 8.7*   Liver Function Tests: Recent Labs  Lab 05/07/20 1240  AST 24  ALT 13  ALKPHOS 72  BILITOT 1.2  PROT 7.4  ALBUMIN 3.9   No results for input(s): LIPASE, AMYLASE in the last 168 hours. No results for input(s):  AMMONIA in the last 168 hours. CBC: Recent Labs  Lab 05/07/20 1240 05/08/20 0415  WBC 4.5 1.2*  NEUTROABS 2.6  --   HGB 13.9 11.8*  HCT 41.3 34.3*  MCV 110.1* 109.9*  PLT 133* 96*   Cardiac Enzymes: No results for input(s): CKTOTAL, CKMB, CKMBINDEX, TROPONINI in the last 168 hours. CBG: No results for input(s): GLUCAP in the last 168 hours.  Studies/Results: CT Soft Tissue Neck W Contrast  Result Date: 05/07/2020 CLINICAL DATA:  44 year old male dialysis patient with sore throat for 1 week. Globus sensation, coughed up some bright red blood. EXAM: CT NECK WITH CONTRAST TECHNIQUE: Multidetector CT imaging of the neck was performed using the standard protocol following the bolus administration of intravenous contrast. CONTRAST:  57mL OMNIPAQUE IOHEXOL 350 MG/ML SOLN COMPARISON:  CTA chest today reported separately. Cervical spine CT 03/23/2018. FINDINGS: Pharynx and larynx: Larynx soft tissue contours appear stable since 2019. The epiglottis is within normal limits. The hypopharynx is mildly distended with air. There is new enlargement of the uvula, 2 cm diameter or slightly larger (versus 8-9 mm in 2019) which appears hypodense but homogeneous on series  1, image 41. See also sagittal image 51. However the remainder of the soft palate seems within normal limits. Other oropharynx soft tissue contours are normal. Nasopharynx appears normal. Parapharyngeal and retropharyngeal spaces remain within normal limits. Salivary glands: Sublingual space, submandibular glands and parotid glands are within normal limits. Thyroid: Negative. Lymph nodes: Negative.  No lymphadenopathy. Vascular: The major vascular structures in the neck and at the skull base appear patent. Bilateral carotid calcified atherosclerosis. Distal vertebral artery calcified plaque. The right vertebral appears mildly dominant. Limited intracranial: Negative. Visualized orbits: Minimally included, negative. Mastoids and visualized  paranasal sinuses: Clear. Skeleton: Edentulous. Chronic cervical spine degeneration with mild progression since 2019. No acute osseous abnormality identified. Upper chest: Reported separately today. Small bilateral layering pleural effusions. No superior mediastinal lymphadenopathy. Mild upper lobe emphysema. Chronic right lateral 2nd rib fracture. IMPRESSION: 1. Positive for Swollen/Edematous Uvula (uvulitis), which is double or more in size compared to a 2019 CT. This is nonspecific and can be seen with allergy, infection, and trauma. But as the remainder of the pharynx appears normal, angioedema may be most likely. 2. Otherwise negative Neck.  Chest CTA today reported separately. Electronically Signed   By: Genevie Ann M.D.   On: 05/07/2020 15:33   CT Angio Chest PE W/Cm &/Or Wo Cm  Result Date: 05/07/2020 CLINICAL DATA:  Sore throat and chest pain EXAM: CT ANGIOGRAPHY CHEST WITH CONTRAST TECHNIQUE: Multidetector CT imaging of the chest was performed using the standard protocol during bolus administration of intravenous contrast. Multiplanar CT image reconstructions and MIPs were obtained to evaluate the vascular anatomy. CONTRAST:  51mL OMNIPAQUE IOHEXOL 350 MG/ML SOLN COMPARISON:  02/25/2019 CT, plain film from earlier in the same day. FINDINGS: Cardiovascular: Thoracic aorta demonstrates atherosclerotic calcifications without aneurysmal dilatation or dissection. The heart is at the upper limits of normal in size. Coronary calcifications are noted. Pulmonary artery is well visualized within normal branching pattern. No filling defect is noted to suggest pulmonary embolism. Mediastinum/Nodes: Thoracic inlet is within normal limits. Esophagus is within normal limits. No sizable hilar or mediastinal adenopathy is noted. Lungs/Pleura: Emphysematous changes are noted. No focal infiltrate is seen. Small pleural effusions are noted. Mild bibasilar atelectatic changes are seen. Changes of mild edema are noted. Upper  Abdomen: Multiple cysts are noted throughout the liver as well as the right kidney consistent with polycystic disease. No acute abnormality in the abdomen is noted. Musculoskeletal: Old second and seventh rib fractures. No acute bony abnormality is noted. Review of the MIP images confirms the above findings. IMPRESSION: No evidence of pulmonary emboli. Changes consistent with polycystic liver and kidney disease. No acute abnormality noted. Aortic Atherosclerosis (ICD10-I70.0) and Emphysema (ICD10-J43.9). Electronically Signed   By: Inez Catalina M.D.   On: 05/07/2020 15:09   DG Chest Port 1 View  Result Date: 05/07/2020 CLINICAL DATA:  Chest pain EXAM: PORTABLE CHEST 1 VIEW COMPARISON:  02/25/2019 FINDINGS: Cardiac shadow is mildly enlarged but stable. Previously seen dialysis catheter is been removed. The lungs are clear bilaterally. No bony abnormality is seen. Old rib fractures are noted on the right. IMPRESSION: No acute abnormality noted. Electronically Signed   By: Inez Catalina M.D.   On: 05/07/2020 13:22   Medications: . [START ON 05/09/2020] sodium chloride    . [START ON 05/09/2020] sodium chloride    . cefTRIAXone (ROCEPHIN)  IV     . dolutegravir  50 mg Oral Daily  . dronabinol  5 mg Oral QAC lunch  . emtricitabine-tenofovir AF  1  tablet Oral Daily  . folic acid  1 mg Oral Daily  . heparin sodium (porcine)      . nicotine  21 mg Transdermal Daily  . thiamine  100 mg Oral Daily

## 2020-05-08 NOTE — Progress Notes (Signed)
Inpatient Diabetes Program Recommendations  AACE/ADA: New Consensus Statement on Inpatient Glycemic Control (2015)  Target Ranges:  Prepandial:   less than 140 mg/dL      Peak postprandial:   less than 180 mg/dL (1-2 hours)      Critically ill patients:  140 - 180 mg/dL   Lab Results  Component Value Date   GLUCAP 123 (H) 01/25/2019   HGBA1C 5.8 (H) 08/09/2016    Review of Glycemic Control Results for Brian [name], Brian [name] (MRN [id number]) as of 05/08/2020 10:34  Ref. Range 05/07/2020 12:40  Glucose Latest Ref Range: 70 - 99 mg/dL 61 (L)   Diabetes history: No noted hx Outpatient Diabetes medications: none Current orders for Inpatient glycemic control: none Solumedrol 125 mg x 1  Inpatient Diabetes Program Recommendations:    Noted mild hypoglycemic episode of 61 mg/dL yesterday. Of note, patient received Solumedrol 125 mg x1. Consider adding CBGs TID & HS.   Thanks, Bronson Curb, MSN, RNC-OB Diabetes Coordinator [phone] (8a-5p)

## 2020-05-08 NOTE — Progress Notes (Signed)
   Subjective: Pt is a 44-yr-old man with PMH of COPD, DVT, HIV, chronic back pain, and HTN who presented to the hospital with a sore throat and occasional red phlegm. Pt seen at bedside and doing well. Sore throat is mildly improving. Pt reports chronic sinus congestion and phlegm production.  Objective:  Vital signs in last 24 hours: Vitals:   05/08/20 0930 05/08/20 1000 05/08/20 1030 05/08/20 1205  BP: 135/67 131/65 139/80 (!) 144/96  Pulse: 99 97 99 (!) 110  Resp: 16 16 16 18   Temp:    98.1 F (36.7 C)  TempSrc:    Oral  SpO2:    99%  Weight:      Height:       General: Pt is in NAD HEENT: [place]/AT. Grossly EOMI. Uvula is enlarged and edematous Cardiac: No heaves or thrills appreciated Respiratory: Pulmonary effort is normal.  Abdominal: Abdomen is soft. No scars or masses noted on exam Extremities: Spontaneously moving all extremities Neuro: Pt is alert Skin: Warm and dry   Ref Range & Units 1 d ago  Group A Strep by PCR NOT DETECTED NOT DETECTED    Ref Range & Units 1 d ago  (05/07/20) 1 d ago  (05/07/20)   Troponin I (High Sensitivity) <18 ng/L 92High   83High  CM     Ref Range & Units 5 mo ago  CD4 T Cell Abs 400 - 1,790 /uL 840   CD4 % Helper T Cell 33 - 65 % 51    Assessment/Plan:  Active Problems:   Uvulitis  Uvulitis: Pt has enlarged, edematous uvula on physical exam. Sore throat is improving. Strep test is negative. Started on clindamycin initially but stopped after agranulocytosis noted on labs. After clindamycin discontinuation, WBC improved from 1.2 to 2.0. Pharmacy recommends ceftriaxone for treatment. ENT consulted, who recommended continued IV antibiotic treatment. -IV ceftriaxone 2 g  HIV positivity: Well-controlled. Last CD4 count 840. -Dolutegravir 50 mg QD -Descovy 200-25 mg QD  FEN/GI: -Lactulose 20 g QD PRN -Folic acid 1 mg -Thiamine 100 mg QD  DVT prophylaxis: -Heparin sodium 1000 units/mL  CODE STATUS: FULL  Prior to Admission Living  Arrangement: Home Anticipated Discharge Location: Home Barriers to Discharge: None Dispo: Anticipated discharge in greater than 2 days  Josepha Pigg, Medical Student 05/08/2020, 12:57 PM Pager: [phone]

## 2020-05-09 LAB — PROTIME-INR
INR: 2.5 — ABNORMAL HIGH (ref 0.8–1.2)
Prothrombin Time: 25.8 seconds — ABNORMAL HIGH (ref 11.4–15.2)

## 2020-05-09 LAB — CBC
HCT: 37.4 % — ABNORMAL LOW (ref 39.0–52.0)
Hemoglobin: 12.4 g/dL — ABNORMAL LOW (ref 13.0–17.0)
MCH: 37.1 pg — ABNORMAL HIGH (ref 26.0–34.0)
MCHC: 33.2 g/dL (ref 30.0–36.0)
MCV: 112 fL — ABNORMAL HIGH (ref 80.0–100.0)
Platelets: 101 10*3/uL — ABNORMAL LOW (ref 150–400)
RBC: 3.34 MIL/uL — ABNORMAL LOW (ref 4.22–5.81)
RDW: 14.6 % (ref 11.5–15.5)
WBC: 4.9 10*3/uL (ref 4.0–10.5)
nRBC: 0 % (ref 0.0–0.2)

## 2020-05-09 LAB — C4 COMPLEMENT: Complement C4, Body Fluid: 25 mg/dL (ref 12–38)

## 2020-05-09 LAB — HEPATITIS B SURFACE ANTIBODY, QUANTITATIVE: Hep B S AB Quant (Post): 15.2 m[IU]/mL (ref >9.9)

## 2020-05-09 MED ORDER — WARFARIN - PHARMACIST DOSING INPATIENT: Freq: Every day | Status: DC

## 2020-05-09 MED ORDER — FERRIC CITRATE 1 GM 210 MG(FE) PO TABS
420.0000 mg | ORAL_TABLET | Freq: Three times a day (TID) | ORAL | Status: DC
  Administered 2020-05-09 – 2020-05-11 (×6): 420 mg via ORAL
  Filled 2020-05-09 (×5): qty 2

## 2020-05-09 MED ORDER — CALCITRIOL 0.5 MCG PO CAPS
2.0000 ug | ORAL_CAPSULE | ORAL | Status: DC
  Administered 2020-05-10: 2 ug via ORAL

## 2020-05-09 MED ORDER — DIPHENHYDRAMINE HCL 50 MG/ML IJ SOLN
25.0000 mg | Freq: Once | INTRAMUSCULAR | Status: AC
  Administered 2020-05-09: 25 mg via INTRAVENOUS
  Filled 2020-05-09: qty 1

## 2020-05-09 MED ORDER — WARFARIN SODIUM 7.5 MG PO TABS
7.5000 mg | ORAL_TABLET | Freq: Once | ORAL | Status: AC
  Administered 2020-05-09: 7.5 mg via ORAL
  Filled 2020-05-09: qty 1

## 2020-05-09 MED ORDER — DIPHENHYDRAMINE HCL 50 MG/ML IJ SOLN
25.0000 mg | Freq: Four times a day (QID) | INTRAMUSCULAR | Status: DC
  Administered 2020-05-09 – 2020-05-11 (×7): 25 mg via INTRAVENOUS
  Filled 2020-05-09 (×7): qty 1

## 2020-05-09 NOTE — Progress Notes (Signed)
Subjective:  tolerated hd yest , throat slightly better   Objective Vital signs in last 24 hours: Vitals:   05/08/20 1711 05/08/20 2135 05/09/20 0436 05/09/20 0929  BP: 122/86 139/85 120/86 129/86  Pulse: 100 (!) 109 (!) 104 (!) 102  Resp: 12 16 16 18   Temp: 98.1 F (36.7 C) 98.1 F (36.7 C) 97.9 F (36.6 C) 98.2 F (36.8 C)  TempSrc: Oral Oral Oral   SpO2: 97% 97% 99% 100%  Weight:  80.5 kg    Height:       Weight change: -3.2 kg  Physical Exam: General: Thin, Chronically ill appearing male, nad Lungs: CTA, Breathing is unlabored. Heart:Tachy, regular rhythm. No m, r,g Abdomen:soft non-tender, no ascites  Extremities:No sig LE edema. Dialysis Access:LUEAVF + bruit     OP Dialysis Orders: GO MWF 4h 200NRe 500/800 EDW 78.5kg 2K/2.5Ca UFP 1 AVF Hep 5000 Calcitriol 2.0  Problem/Plan 1. ESRD -Missed HD Friday  5/21. Admitted  wCP/SOB,cough, mild hyperkalemia. CXRclear. CT angio neg for PE. k 5.4  Admit Lokelma No cp or sob since HD  2. Hypertension/volume - Hypertensive on admission = improved with 4420 cc UF HD  5/24  , 2.0 kg above edw post wt , improved  BP/ attempt 3- 4 l uf tomor hd   3. Anemia - Hgb 12.4  No ESAneeds.  4. 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 Uvulitis- wu /rx per admit IV antibio  per admit  7. ETOH abuse - per admit CIWA protocol  [name], PA-C [facility] Beeper [phone] 05/09/2020,11:17 AM  LOS: 2 days   Labs: Basic Metabolic Panel: Recent Labs  Lab 05/07/20 1240 05/08/20 1008  NA 133* 128*  K 5.4* 5.3*  CL 90* 88*  CO2 18* 18*  GLUCOSE 61* 157*  BUN 59* 76*  CREATININE 13.56* 14.92*  CALCIUM 8.7* 8.1*  PHOS  --  8.1*   Liver Function Tests: Recent Labs  Lab 05/07/20 1240 05/08/20 1008  AST 24  --   ALT 13  --   ALKPHOS 72  --   BILITOT 1.2  --   PROT 7.4  --   ALBUMIN 3.9 3.6    No results for input(s): LIPASE, AMYLASE in the last 168 hours. No results for input(s): AMMONIA in the last 168 hours. CBC: Recent Labs  Lab 05/07/20 1240 05/07/20 1240 05/08/20 0415 05/08/20 1008 05/09/20 0649  WBC 4.5   < > 1.2* 2.0* 4.9  NEUTROABS 2.6  --   --  1.6*  --   HGB 13.9   < > 11.8* 11.6* 12.4*  HCT 41.3   < > 34.3* 34.4* 37.4*  MCV 110.1*  --  109.9* 109.6* 112.0*  PLT 133*   < > 96* 105* 101*   < > = values in this interval not displayed.   Cardiac Enzymes: No results for input(s): CKTOTAL, CKMB, CKMBINDEX, TROPONINI in the last 168 hours. CBG: No results for input(s): GLUCAP in the last 168 hours.   Medications: . cefTRIAXone (ROCEPHIN)  IV Stopped (05/08/20 1307)   . dolutegravir  50 mg Oral QHS  . dronabinol  5 mg Oral QAC lunch  . emtricitabine-tenofovir AF  1 tablet Oral QHS  . folic acid  1 mg Oral Daily  . nicotine  21 mg Transdermal Daily  . thiamine  100 mg Oral Daily  . warfarin  7.5 mg Oral ONCE-1600  .  Warfarin - Pharmacist Dosing Inpatient   Does not apply [id number]

## 2020-05-09 NOTE — Progress Notes (Signed)
   Subjective: Pt is a 44-yr-old man with PMH of COPD, DVT, HIV, chronic back pain, and HTN who presented to the hospital with a sore throat and occasional red phlegm. He states that his sore throat is worse than yesterday but better than when he was admitted. He believes his throat is worse because he coughed up non-bloody phlegm today. Reports abdominal pain but states it is chronic and attributes it to the presence of multiple hernias. Denies fevers and chills, N/V/D.  PMH is positive for enlarged uvula in 2019. FH is significant for uvulitis in grandmother and negative for swelling.  Objective:  Vital signs in last 24 hours: Vitals:   05/08/20 1711 05/08/20 2135 05/09/20 0436 05/09/20 0929  BP: 122/86 139/85 120/86 129/86  Pulse: 100 (!) 109 (!) 104 (!) 102  Resp: 12 16 16 18   Temp: 98.1 F (36.7 C) 98.1 F (36.7 C) 97.9 F (36.6 C) 98.2 F (36.8 C)  TempSrc: Oral Oral Oral   SpO2: 97% 97% 99% 100%  Weight:  80.5 kg    Height:       General: Pt is in NAD HEENT: [place]/AT. Grossly EOMI. Uvula is enlarged and edematous Cardiac: RRR. No murmurs, rubs, or gallops Respiratory: Pulmonary effort is normal.  Abdominal: Abdomen is soft Extremities: Spontaneously moving all extremities Neuro: Pt is alert Skin: Warm and dry. No urticaria   Ref Range & Units 1 d ago  Complement C4, Body Fluid 12 - 38 mg/dL 25    Assessment/Plan:  Active Problems:   Uvulitis  Pt is a 44-yr-old man with PMH of COPD, DVT, HIV, chronic back pain, and HTN and chief complaint of sore throat who was diagnosed with uvulitis. Pt is receiving antibiotics. After discontinuation of clindamycin, pancytopenia has improved.  Uvulitis: Pt has enlarged, edematous uvula on physical exam. Sore throat is worse than yesterday but improved since admission. 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 C4 is normal at 25. -IV ceftriaxone 2 g -Check C1 inhibitor protein levels -Check C1 inhibitor function  Pancytopenia: All cell lines are improved from previous. WBC has normalized to 4.9. Platelets and RBCs improving. -Trend CBC daily  HIV positivity: Well-controlled. Last CD4 count 840. -Dolutegravir 50 mg QD -Descovy 200-25 mg QD  ESRD: Last Cr 14.92. GFR 3. Pt receives HD on MWF. -Continue HD MWF  FEN/GI: -Lactulose 20 g QD PRN -Folic acid 1 mg -Thiamine 100 mg QD  DVT prophylaxis: -Heparin sodium 1000 units/mL  CODE STATUS: FULL  Prior to Admission Living Arrangement: Home Anticipated Discharge Location: Home Barriers to Discharge: None Dispo: Anticipated discharge in 1 day   Josepha Pigg, Medical Student 05/09/2020, 10:39 AM Pager: [phone]

## 2020-05-09 NOTE — Progress Notes (Addendum)
[place] for Warfarin Indication: VTE prophylaxis  Allergies  Allergen Reactions  . Chlorhexidine   . Cat Hair Extract Other (See Comments)    SNEEZING   Labs: Recent Labs    05/07/20 1240 05/07/20 1240 05/07/20 1426 05/08/20 0415 05/08/20 0415 05/08/20 1008 05/09/20 0510 05/09/20 0649  HGB 13.9   < >  --  11.8*   < > 11.6*  --  12.4*  HCT 41.3   < >  --  34.3*  --  34.4*  --  37.4*  PLT 133*   < >  --  96*  --  105*  --  101*  LABPROT 43.8*  --   --  54.0*  --   --  25.8*  --   INR 4.8*  --   --  6.3*  --   --  2.5*  --   CREATININE 13.56*  --   --   --   --  14.92*  --   --   TROPONINIHS 83*  --  92*  --   --   --   --   --    < > = values in this interval not displayed.    Estimated Creatinine Clearance: 7 mL/min (A) (by C-G formula based on SCr of 14.92 mg/dL (H)).   Assessment: Patient is a 3 yom that is being admitted after missing his HS session on Friday. 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 Pharmacy has been asked to dose his warfarin while inpatient.   INR supra-therapeutic on admission and 5/24 INR has trended back down to 2.5 this AM  No bleeding noted  Goal of Therapy:  INR 2-3 Monitor platelets by anticoagulation protocol: Yes   Plan:  Warfarin 7.5 mg po x 1 dose at 1600 pm Daily INR  Thank you Anette Guarneri, PharmD [phone]  05/09/2020,9:51 AM

## 2020-05-09 NOTE — Plan of Care (Signed)
  Problem: Fluid Volume: Goal: Compliance with measures to maintain balanced fluid volume will improve Outcome: Progressing   Problem: Pain Managment: Goal: General experience of comfort will improve Outcome: Progressing

## 2020-05-10 DIAGNOSIS — N186 End stage renal disease: Secondary | ICD-10-CM

## 2020-05-10 LAB — CBC
HCT: 37.4 % — ABNORMAL LOW (ref 39.0–52.0)
Hemoglobin: 12.4 g/dL — ABNORMAL LOW (ref 13.0–17.0)
MCH: 37.6 pg — ABNORMAL HIGH (ref 26.0–34.0)
MCHC: 33.2 g/dL (ref 30.0–36.0)
MCV: 113.3 fL — ABNORMAL HIGH (ref 80.0–100.0)
Platelets: 91 10*3/uL — ABNORMAL LOW (ref 150–400)
RBC: 3.3 MIL/uL — ABNORMAL LOW (ref 4.22–5.81)
RDW: 14.7 % (ref 11.5–15.5)
WBC: 3.9 10*3/uL — ABNORMAL LOW (ref 4.0–10.5)
nRBC: 0 % (ref 0.0–0.2)

## 2020-05-10 LAB — BASIC METABOLIC PANEL
Anion gap: 15 (ref 5–15)
BUN: 44 mg/dL — ABNORMAL HIGH (ref 6–20)
CO2: 23 mmol/L (ref 22–32)
Calcium: 8.2 mg/dL — ABNORMAL LOW (ref 8.9–10.3)
Chloride: 91 mmol/L — ABNORMAL LOW (ref 98–111)
Creatinine, Ser: 10.19 mg/dL — ABNORMAL HIGH (ref 0.61–1.24)
GFR calc Af Amer: 6 mL/min — ABNORMAL LOW (ref >60)
GFR calc non Af Amer: 5 mL/min — ABNORMAL LOW (ref >60)
Glucose, Bld: 92 mg/dL (ref 70–99)
Potassium: 4.4 mmol/L (ref 3.5–5.1)
Sodium: 129 mmol/L — ABNORMAL LOW (ref 135–145)

## 2020-05-10 LAB — PROTIME-INR
INR: 1.7 — ABNORMAL HIGH (ref 0.8–1.2)
Prothrombin Time: 18.9 seconds — ABNORMAL HIGH (ref 11.4–15.2)

## 2020-05-10 LAB — C1 ESTERASE INHIBITOR: C1INH SerPl-mCnc: 40 mg/dL — ABNORMAL HIGH (ref 21–39)

## 2020-05-10 MED ORDER — CALCITRIOL 0.5 MCG PO CAPS
ORAL_CAPSULE | ORAL | Status: AC
  Filled 2020-05-10: qty 4

## 2020-05-10 MED ORDER — WARFARIN SODIUM 10 MG PO TABS
10.0000 mg | ORAL_TABLET | Freq: Once | ORAL | Status: AC
  Administered 2020-05-10: 10 mg via ORAL
  Filled 2020-05-10: qty 1

## 2020-05-10 MED ORDER — HEPARIN SODIUM (PORCINE) 1000 UNIT/ML IJ SOLN
INTRAMUSCULAR | Status: AC
  Administered 2020-05-10: 5000 [IU]
  Filled 2020-05-10: qty 5

## 2020-05-10 NOTE — Progress Notes (Signed)
[place] for Warfarin Indication: DVT / PE history   Allergies  Allergen Reactions  . Clindamycin/Lincomycin     Severe pancytopenia   . Chlorhexidine   . Cat Hair Extract Other (See Comments)    SNEEZING   Labs: Recent Labs    05/07/20 1240 05/07/20 1240 05/07/20 1426 05/08/20 0415 05/08/20 0415 05/08/20 1008 05/08/20 1008 05/09/20 0510 05/09/20 0649 05/10/20 0419  HGB 13.9   < >  --  11.8*   < > 11.6*   < >  --  12.4* 12.4*  HCT 41.3   < >  --  34.3*   < > 34.4*  --   --  37.4* 37.4*  PLT 133*   < >  --  96*   < > 105*  --   --  101* 91*  LABPROT 43.8*   < >  --  54.0*  --   --   --  25.8*  --  18.9*  INR 4.8*   < >  --  6.3*  --   --   --  2.5*  --  1.7*  CREATININE 13.56*  --   --   --   --  14.92*  --   --   --  10.19*  TROPONINIHS 83*  --  92*  --   --   --   --   --   --   --    < > = values in this interval not displayed.    Estimated Creatinine Clearance: 10.5 mL/min (A) (by C-G formula based on SCr of 10.19 mg/dL (H)).   Assessment: Patient is a 38 yom that is being admitted after missing his HS session on Friday. 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 Pharmacy has been asked to dose his warfarin while inpatient.   INR supra-therapeutic on admission and 5/24 INR down to 1.7 today   No bleeding noted  Goal of Therapy:  INR 2-3 Monitor platelets by anticoagulation protocol: Yes   Plan:  Warfarin 10 mg po x 1 dose at 1600 pm Daily INR  Thank you Anette Guarneri, PharmD [phone]  05/10/2020,9:05 AM

## 2020-05-10 NOTE — Progress Notes (Signed)
   Subjective: Pt seen in HD today. States that sore throat is mildly improved from yesterday. Reports coughing up phlegm that is yellow-brown but non-bloody. Denies fevers and chills.   Objective:  Vital signs in last 24 hours: Vitals:   05/10/20 0800 05/10/20 0830 05/10/20 0900 05/10/20 0930  BP: 116/68 122/65 (!) 123/57 124/60  Pulse: 90 92 92 92  Resp:      Temp:      TempSrc:      SpO2:      Weight:      Height:       General: Pt is in NAD HEENT: [place]/AT. Grossly EOMI. Uvula is enlarged and edematous, mildly improved from previous Cardiac: RRR. No murmurs, rubs, or gallops. AV fistula with palpable thrill present on the left arm Respiratory: Pt is in no respiratory distress Abdominal: Abdomen is soft with normoactive bowel sounds Extremities: spontaneously moving all extremities Neuro: Pt is alert Skin: Warm and dry. No urticaria. Several ecchymoses noted on bilateral forearms  Assessment/Plan:  Active Problems:   Uvulitis  Pt is a 44-yr-old man with PMH ofCOPD, DVT, HIV, chronic back pain, and HTNand chief complaint of sore throat who was diagnosed with uvulitis. Pt is receiving antibiotics.  Uvulitis: Per pt, sore throat is mildly improved since yesterday. He feels that he has improved a little on Benadryl. Pt hasenlarged, edematous uvulaon physical exam that is mildly improved from previous. Given high pre-test probability for hereditary angioedema, checking C1 inhibitor function and levels is appropriate.  -Continue IV ceftriaxone 2 g -Continue IV Benadryl 25 mg -Follow up C1 inhibitor protein levels -Follow up C1 inhibitor function  Pancytopenia: All cell lines mildly decreased from previous. WBC is 3.9, RBC 3.30, platelets 91. Expected to improve -Trend CBC daily  HIV positivity: Well-controlled. -Dolutegravir 50 mg QD -Descovy 200-25 mg QD  ESRD: Last Cr 10.19. GFR 5. Pt receives HD on MWF. -Continue HD MWF  FEN/GI: -Lactulose 20 g QD PRN -Folic  acid 1 mg -Thiamine 100 mg QD  DVT prophylaxis: -Heparin sodium 1000 units/mL  CODE STATUS: FULL  Prior to Admission Living Arrangement: Home Anticipated Discharge Location: Home Barriers to Discharge: None Dispo: Anticipated discharge in approximately 1 day(s).   Josepha Pigg, Medical Student 05/10/2020, 10:22 AM Pager: [phone]

## 2020-05-10 NOTE — Progress Notes (Signed)
Subjective:  On hd , no cos tolerating HD , and throat discomfort improving .  Objective Vital signs in last 24 hours: Vitals:   05/10/20 0730 05/10/20 0800 05/10/20 0830 05/10/20 0900  BP: 116/65 116/68 122/65 (!) 123/57  Pulse: 94 90 92 92  Resp:      Temp:      TempSrc:      SpO2:      Weight:      Height:       Weight change: -0.2 kg  Physical Exam: General: ON hd , nad ,Chronically ill appearing male Lungs: CTA,Breathing is unlabored. Heart:RRR. No m, r,g Abdomen:soft non-tender, no ascites  Extremities:No sig LE edema. Dialysis Access:LUEAVFpatent on hd   OP Dialysis Orders: GO MWF 4h 200NRe 500/800 EDW 78.5kg 2K/2.5Ca UFP 1 AVF Hep 5000 Calcitriol 2.0  Problem/Plan 1. ESRD -Missed HD Friday5/21. Admitted  wCP/SOB,cough, mild hyperkalemia. CXRclear. CT angio neg for PE.k 5.4 Admit Lokelma No cp or sob since HD  2. Hypertension/volume - Hypertensive on admission=improvedhd uf , currently 4 l; goal with bp 123/57 .  still above edw 2kg , probable Wt loss with decr appetite= throat discomfort  ,fu wts bp to lower edw possible  3. Anemia - Hgb 12.4  No ESAneeds.  4. 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 Uvulitis- wu /rx per admit on IV antibio per admit  7. ETOH abuse - per admitCIWA protocol  Brian Haber, PA-C [facility] [phone] 05/10/2020,9:26 AM  LOS: 3 days   Labs: Basic Metabolic Panel: Recent Labs  Lab 05/07/20 1240 05/08/20 1008 05/10/20 0419  NA 133* 128* 129*  K 5.4* 5.3* 4.4  CL 90* 88* 91*  CO2 18* 18* 23  GLUCOSE 61* 157* 92  BUN 59* 76* 44*  CREATININE 13.56* 14.92* 10.19*  CALCIUM 8.7* 8.1* 8.2*  PHOS  --  8.1*  --    Liver Function Tests: Recent Labs  Lab 05/07/20 1240 05/08/20 1008  AST 24  --   ALT 13  --   ALKPHOS 72  --   BILITOT 1.2  --   PROT 7.4  --   ALBUMIN 3.9 3.6    No results for input(s): LIPASE, AMYLASE in the last 168 hours. No results for input(s): AMMONIA in the last 168 hours. CBC: Recent Labs  Lab 05/07/20 1240 05/07/20 1240 05/08/20 0415 05/08/20 0415 05/08/20 1008 05/09/20 0649 05/10/20 0419  WBC 4.5   < > 1.2*   < > 2.0* 4.9 3.9*  NEUTROABS 2.6  --   --   --  1.6*  --   --   HGB 13.9   < > 11.8*   < > 11.6* 12.4* 12.4*  HCT 41.3   < > 34.3*   < > 34.4* 37.4* 37.4*  MCV 110.1*  --  109.9*  --  109.6* 112.0* 113.3*  PLT 133*   < > 96*   < > 105* 101* 91*   < > = values in this interval not displayed.   Cardiac Enzymes: No results for input(s): CKTOTAL, CKMB, CKMBINDEX, TROPONINI in the last 168 hours. CBG: No results for input(s): GLUCAP in the last 168 hours.  Studies/Results: No results found. Medications: . cefTRIAXone (ROCEPHIN)  IV 2 g (05/09/20 1212)   . calcitRIOL  2 mcg Oral Q M,W,F-HD  . diphenhydrAMINE  25 mg Intravenous Q6H  . dolutegravir  50 mg Oral  QHS  . dronabinol  5 mg Oral QAC lunch  . emtricitabine-tenofovir AF  1 tablet Oral QHS  . ferric citrate  420 mg Oral TID WC  . folic acid  1 mg Oral Daily  . heparin sodium (porcine)      . nicotine  21 mg Transdermal Daily  . thiamine  100 mg Oral Daily  . warfarin  10 mg Oral ONCE-1600  . Warfarin - Pharmacist Dosing Inpatient   Does not apply [id number]

## 2020-05-11 ENCOUNTER — Inpatient Hospital Stay (HOSPITAL_COMMUNITY): Payer: Medicare Other

## 2020-05-11 DIAGNOSIS — R011 Cardiac murmur, unspecified: Secondary | ICD-10-CM

## 2020-05-11 LAB — PROTIME-INR
INR: 1.3 — ABNORMAL HIGH (ref 0.8–1.2)
Prothrombin Time: 15.8 seconds — ABNORMAL HIGH (ref 11.4–15.2)

## 2020-05-11 LAB — CBC
HCT: 38.8 % — ABNORMAL LOW (ref 39.0–52.0)
Hemoglobin: 12.6 g/dL — ABNORMAL LOW (ref 13.0–17.0)
MCH: 37.2 pg — ABNORMAL HIGH (ref 26.0–34.0)
MCHC: 32.5 g/dL (ref 30.0–36.0)
MCV: 114.5 fL — ABNORMAL HIGH (ref 80.0–100.0)
Platelets: 94 10*3/uL — ABNORMAL LOW (ref 150–400)
RBC: 3.39 MIL/uL — ABNORMAL LOW (ref 4.22–5.81)
RDW: 14.7 % (ref 11.5–15.5)
WBC: 4.1 10*3/uL (ref 4.0–10.5)
nRBC: 0 % (ref 0.0–0.2)

## 2020-05-11 LAB — ECHOCARDIOGRAM COMPLETE
Height: 75 in
Weight: 2758.4 oz

## 2020-05-11 MED ORDER — CEFDINIR 300 MG PO CAPS
300.0000 mg | ORAL_CAPSULE | Freq: Every day | ORAL | 0 refills | Status: DC
Start: 2020-05-11 — End: 2020-08-09

## 2020-05-11 MED ORDER — DIPHENHYDRAMINE HCL 25 MG PO CAPS
25.0000 mg | ORAL_CAPSULE | Freq: Four times a day (QID) | ORAL | Status: DC
  Administered 2020-05-11: 25 mg via ORAL
  Filled 2020-05-11: qty 1

## 2020-05-11 MED ORDER — DIPHENHYDRAMINE HCL 25 MG PO CAPS: 25.0000 mg | ORAL_CAPSULE | Freq: Four times a day (QID) | ORAL | 0 refills | Status: DC

## 2020-05-11 MED ORDER — WARFARIN SODIUM 7.5 MG PO TABS
15.0000 mg | ORAL_TABLET | Freq: Once | ORAL | Status: AC
  Administered 2020-05-11: 15 mg via ORAL
  Filled 2020-05-11: qty 2

## 2020-05-11 MED ORDER — PERFLUTREN LIPID MICROSPHERE
1.0000 mL | INTRAVENOUS | Status: DC | PRN
  Administered 2020-05-11: 2 mL via INTRAVENOUS
  Filled 2020-05-11: qty 10

## 2020-05-11 MED FILL — BANOPHEN 25 MG TABLET: 25 | 30 days supply | Qty: 120 | Fill #0

## 2020-05-11 MED FILL — CEFDINIR 300 MG CAPSULE: 300 | 2 days supply | Qty: 2 | Fill #0

## 2020-05-11 NOTE — Progress Notes (Signed)
Subjective:  Tolerated hd yest 3.5 l  on schedule, for dc today , throat discomfort resolving tolerating ,meals   Objective Vital signs in last 24 hours: Vitals:   05/10/20 2022 05/10/20 2109 05/11/20 0604 05/11/20 0858  BP: 117/86 125/89 110/70 114/79  Pulse: (!) 112 (!) 110 98 (!) 102  Resp: 18 18 18 18   Temp: 98.4 F (36.9 C) 98.6 F (37 C) 97.9 F (36.6 C) 98.1 F (36.7 C)  TempSrc: Oral Oral Oral Oral  SpO2: 100% 100% 98% 96%  Weight:      Height:       Weight change: -2.1 kg  Physical Exam: General:ON hd , nad ,Chronically ill appearing male Lungs: CTA,Breathing is unlabored. Heart:RRR. No rub, Abdomen:soft non-tender, no ascites  Extremities:No sig LE edema. Dialysis Access:LUEAVFpatent on hd   OP Dialysis Orders: GO MWF 4h 200NRe 500/800 EDW 78.5kg 2K/2.5Ca UFP 1 AVF Hep 5000 Calcitriol 2.0  Problem/Plan 1. ESRD -Missed HD Friday5/21.AdmittedwCP/SOB,cough, mild hyperkalemia. CXRclear. CT angio neg for PE.k 5.4 Admit Lokelma No cp or sob since HD 2. Hypertension/volume - Hypertensive on admission=improvedhd uf , Now at edw . And bp improved with uf and bp meds / admit ordered 2d echo  3. Anemia - Hgb12.6No ESAneeds.  4. 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 Uvulitis- wu /rx per admit on IV antibio per admit with further WU as OP with ID and ENT With ENT  7. ETOH abuse - per admitCIWA protocol  [name], PA-C [facility] [phone] 05/11/2020,2:48 PM  LOS: 4 days   Labs: Basic Metabolic Panel: Recent Labs  Lab 05/07/20 1240 05/08/20 1008 05/10/20 0419  NA 133* 128* 129*  K 5.4* 5.3* 4.4  CL 90* 88* 91*  CO2 18* 18* 23  GLUCOSE 61* 157* 92  BUN 59* 76* 44*  CREATININE 13.56* 14.92* 10.19*  CALCIUM 8.7* 8.1* 8.2*  PHOS  --  8.1*  --    Liver Function Tests: Recent Labs   Lab 05/07/20 1240 05/08/20 1008  AST 24  --   ALT 13  --   ALKPHOS 72  --   BILITOT 1.2  --   PROT 7.4  --   ALBUMIN 3.9 3.6   No results for input(s): LIPASE, AMYLASE in the last 168 hours. No results for input(s): AMMONIA in the last 168 hours. CBC: Recent Labs  Lab 05/07/20 1240 05/07/20 1240 05/08/20 0415 05/08/20 0415 05/08/20 1008 05/08/20 1008 05/09/20 0649 05/10/20 0419 05/11/20 0427  WBC 4.5   < > 1.2*   < > 2.0*   < > 4.9 3.9* 4.1  NEUTROABS 2.6  --   --   --  1.6*  --   --   --   --   HGB 13.9   < > 11.8*   < > 11.6*   < > 12.4* 12.4* 12.6*  HCT 41.3   < > 34.3*   < > 34.4*   < > 37.4* 37.4* 38.8*  MCV 110.1*   < > 109.9*  --  109.6*  --  112.0* 113.3* 114.5*  PLT 133*   < > 96*   < > 105*   < > 101* 91* 94*   < > = values in this interval not displayed.   Cardiac Enzymes: No results for input(s): CKTOTAL, CKMB, CKMBINDEX, TROPONINI in the last 168 hours. CBG: No results for  input(s): GLUCAP in the last 168 hours.  Studies/Results: No results found. Medications: . cefTRIAXone (ROCEPHIN)  IV 2 g (05/11/20 1300)   . calcitRIOL  2 mcg Oral Q M,W,F-HD  . diphenhydrAMINE  25 mg Oral Q6H  . dolutegravir  50 mg Oral QHS  . dronabinol  5 mg Oral QAC lunch  . emtricitabine-tenofovir AF  1 tablet Oral QHS  . ferric citrate  420 mg Oral TID WC  . folic acid  1 mg Oral Daily  . nicotine  21 mg Transdermal Daily  . thiamine  100 mg Oral Daily  . Warfarin - Pharmacist Dosing Inpatient   Does not apply [id number]

## 2020-05-11 NOTE — Plan of Care (Signed)
  Problem: Fluid Volume: Goal: Compliance with measures to maintain balanced fluid volume will improve Outcome: Progressing   Problem: Pain Managment: Goal: General experience of comfort will improve Outcome: Progressing

## 2020-05-11 NOTE — Progress Notes (Addendum)
   Subjective: Pt was seen at bedside. Feeling well overall with mild improvement of sore throat. Reports that Benadryl cleared his sinuses, which is unusual for him as he is typically congested in the morning. Denies fevers, chills, phlegm production, CP, and SOB.  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  Objective:  Vital signs in last 24 hours: Vitals:   05/10/20 2022 05/10/20 2109 05/11/20 0604 05/11/20 0858  BP: 117/86 125/89 110/70 114/79  Pulse: (!) 112 (!) 110 98 (!) 102  Resp: 18 18 18 18   Temp: 98.4 F (36.9 C) 98.6 F (37 C) 97.9 F (36.6 C) 98.1 F (36.7 C)  TempSrc: Oral Oral Oral Oral  SpO2: 100% 100% 98% 96%  Weight:      Height:       General: Pt is in NAD HEENT: [place]/AT. Grossly EOMI. Uvula is swollen and enlarged, improved from previous Cardiac: Holosystolic murmur present that does not become quieter with compression of AV fistula. Palpable thrill on left arm AV fistula Respiratory: Pt is in no respiratory distress Abdominal: Abdomen is soft and non-tender with normoactive bowel sounds Extremities: moving all extremities spontaneously Neuro: Pt is alert Skin: Warm and dry   Ref Range & Units 2 d ago  C1INH SerPl-mCnc 21 - 39 mg/dL 40High     Assessment/Plan:  Active Problems:   Uvulitis  Pt is a 44-yr-old man with PMH of COPD, HIV, DVT, PE, and previous uvulitis who presented to the hospital with sore throat and was diagnosed with uvulitis. Sore throat is improving. Pt is ready for discharge.   Uvulitis: Per pt, sore throat ismildly improved since yesterday. Pt hasenlarged, edematous uvulaon physical exam that is mildly improved from previous.C4 was normal at 25. C1 inhibitor labs returned normal. Etiology of uvulitis unclear, and normal complement levels inconsistent with types I and II hereditary angioedema. Hereditary angioedema associated with a variant is a possible cause of uvulitis. -IV ceftriaxone 2 g, final  dose -PO cefdinivir 2 doses outpatient, once given after tomorrow HD and once on 05/14/20 -PO Benadryl 25 mg q6h PRN -Pt follow up w/ENT  Murmur: Holosystolic murmur that did not become quieter with compression of AV fistula auscultated on exam today. 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 Echocardiogram ordered to assess for structural abnormalities, to be performed today prior to discharge. -Echo today  Pancytopenia:All cell lines improved from previous. WBC is 4.1, RBC 3.39, platelets 94. Expected to improve  HIV positivity: Well-controlled. -Dolutegravir 50 mg QD -Descovy 200-25 mg QD  ESRD: Pt receives HD on MWF. -Continue HD MWF outpatient  DVT prophylaxis: Home warfarin will be resumed after discharge. -One dose warfarin 15 mg before discharge  CODE STATUS: FULL  Prior to Admission Living Arrangement: Home Anticipated Discharge Location: Home Barriers to Discharge: None Dispo: Anticipated discharge today   Josepha Pigg, Medical Student 05/11/2020, 10:42 AM Pager: [phone]

## 2020-05-11 NOTE — Discharge Summary (Addendum)
Name: Brian [name] MRN: [id number] DOB: [birth year] 44 y.o. PCP: Al Decant, MD  Date of Admission: 05/07/2020 11:42 AM Date of Discharge: 05/11/2020 Attending Physician: Lenice Pressman, MD, PhD   Discharge Diagnosis: 1. Uvulitis 2. Murmur  3. Clindamycin Allergy 4. HIV positivity 5. ESRD  Discharge Medications: Allergies as of 05/11/2020       Reactions   Clindamycin/lincomycin    Severe pancytopenia    Chlorhexidine    Cat Hair Extract Other (See Comments)   SNEEZING        Medication List     TAKE these medications    albuterol 108 (90 Base) MCG/ACT inhaler Commonly known as: VENTOLIN HFA INHALE 2 PUFFS INTO THE LUNGS EVERY 6 HOURS AS NEEDED FOR WHEEZING OR SHORTNESS OF BREATH What changed:  how much to take how to take this when to take this reasons to take this additional instructions   Auryxia 1 GM 210 MG(Fe) tablet Generic drug: ferric citrate Take 1 tablet by mouth See admin instructions. Take 2 tablets by mouth three times daily with meals, then take 1 tablet by mouth daily with a snack per patient   cefdinir 300 MG capsule Commonly known as: OMNICEF Take 1 capsule (300 mg total) by mouth daily. Take one capsule after dialysis on 05/12/2020. Take the last capsule on 05/14/2020. Notes to patient: **NEW** To treat infection. Take twice daily until all gone    Descovy 200-25 MG tablet Generic drug: emtricitabine-tenofovir AF Take 1 tablet by mouth daily.   diphenhydrAMINE 25 mg capsule Commonly known as: BENADRYL Take 1 capsule (25 mg total) by mouth every 6 (six) hours. Notes to patient: **NEW** To treat swelling. May cause drowsiness and dizziness.   dronabinol 5 MG capsule Commonly known as: MARINOL Take 1 capsule (5 mg total) by mouth daily before lunch.   fluticasone 50 MCG/ACT nasal spray Commonly known as: FLONASE Place 2 sprays into both nostrils daily.   Generlac 10 GM/15ML Soln Generic drug: lactulose (encephalopathy) Take 20  g by mouth daily as needed (constipation).   oxyCODONE-acetaminophen 10-325 MG tablet Commonly known as: Percocet Take 1 tablet by mouth every 8 (eight) hours as needed for pain. What changed: Another medication with the same name was removed. Continue taking this medication, and follow the directions you see here.   rOPINIRole 0.25 MG tablet Commonly known as: REQUIP Take 2 tablets (0.5 mg total) by mouth at bedtime for 2 days, THEN 3 tablets (0.75 mg total) at bedtime for 2 days, THEN 4 tablets (1 mg total) at bedtime for 2 days, THEN 5 tablets (1.25 mg total) at bedtime for 2 days, THEN 6 tablets (1.5 mg total) at bedtime for 2 days, THEN 7 tablets (1.75 mg total) at bedtime for 2 days, THEN 8 tablets (2 mg total) at bedtime for 2 days. Start taking on: Apr 25, 2020 What changed: Another medication with the same name was removed. Continue taking this medication, and follow the directions you see here.   Tivicay 50 MG tablet Generic drug: dolutegravir Take 1 tablet (50 mg total) by mouth daily.   warfarin 5 MG tablet Commonly known as: COUMADIN Take as directed. If you are unsure how to take this medication, talk to your nurse or doctor. Original instructions: TAKE 1 AND 1/2 TABLETS(7.5 MG) BY MOUTH DAILY What changed: See the new instructions.        Disposition and follow-up:   Brian [name] was discharged from [facility] in Stable condition.  At the hospital follow up visit please address:  1.  Uvulitis:   Follow up with ENT   Completion of antibiotics  2.   Murmur   Echo at discharge shows severe mitral regurgitation.   Consider cardiology referral.  3. Clindamycin Allergy  Ensure that patient's documentation reflects clindamycin allergy with side effect profile of pancytopenia.      Labs / imaging needed at time of follow-up: CBC with differential     Pending labs/ test needing follow-up: None  Follow-up Appointments: Follow-up Information      Jerrell Belfast, MD. Go to.   Specialty: Otolaryngology Why: Call to confirm or your appointment on 05/18/2020. Contact information: [address] [place] [address]         Al Decant, MD. Schedule an appointment as soon as possible for a visit.   Specialty: Internal Medicine Why: Please follow up with Dr. Elie Confer in the [facility] Contact information: [address] [place] [address] [place] Hospital Course by problem list: 1. Uvulitis Patient presented to [facility] with dysphagia and found to have an enlarged tonsil on imaging. Labs were unremarkable. CENTOR SCORE of 0  but patient was started on clindamycin. After starting dose of clindamycin patient became pancytopenic and clindamycin was discontinued. ENT was consulted and it was recommended to continue with antibiotic therapy. Patient was switched to ceftriaxone and instructed to complete the course with cefdinir after discharge. He was also treated with IV Benadryl in the setting of possible isolated angioedema as he had a family history of uvulitis with no known trigger in his sister. Patient discharged with benadryl and cefdinir and instructed to follow up with ENT.   2. Murmur  Incidental murmur noted on physical examination. Patient underwent an ECHO prior to discharge. After his discharge, his echo results were concerning for severe mitral regurgitation. Recommended to follow up with PCP and possible cardiology referral.  3. Clindamycin Allergy Patient treated with clindamycin for uvulitis and found to experience pancytopenia after one dose. Clindamycin was discontinued and his CBC was trended, with improvement in his cell lines. Patient's chart was changed to reflect this allergy. Patient discharged with instructions to tell healthcare providers that he is allergic to this medication.   4. HIV: Patient with HIV was managed with his home dose of  Dolutegravir and Descovy.   5. End Stage Renal Disease Patient with a history of end stage renal disease was managed on his Monday, Wednesday, Friday dialysis schedule while hospitalized.   Discharge Vitals:   BP 114/79 (BP Location: Right Arm)   Pulse (!) 102   Temp 98.1 F (36.7 C) (Oral)   Resp 18   Ht 6\' 3"  (1.905 m)   Wt 78.2 kg   SpO2 96%   BMI 21.55 kg/m   Pertinent Labs, Studies, and Procedures:    Ref Range & Units 6 d ago  (05/09/20) 7 d ago  (05/08/20) 7 d ago  (05/08/20) 8 d ago  (05/07/20)  WBC 4.0 - 10.5 K/uL 4.9  2.0Low   1.2Low Panic  CM  4.5   RBC 4.22 - 5.81 MIL/uL 3.34Low   3.14Low   3.12Low   3.75Low    Hemoglobin 13.0 - 17.0 g/dL 12.4Low   11.6Low   11.8Low   13.9   HCT 39.0 - 52.0 % 37.4Low   34.4Low   34.3Low   41.3   MCV 80.0 -  100.0 fL 112.0High   109.6High   109.9High   110.1High    MCH 26.0 - 34.0 pg 37.1High   36.9High   37.8High   37.1High    MCHC 30.0 - 36.0 g/dL 33.2  33.7  34.4  33.7   RDW 11.5 - 15.5 % 14.6  14.4  14.5  14.5   Platelets 150 - 400 K/uL 101Low   105Low  CM  96Low  CM  133Low     CBC Latest Ref Rng & Units 05/11/2020 05/10/2020 05/09/2020  WBC 4.0 - 10.5 K/uL 4.1 3.9(L) 4.9  Hemoglobin 13.0 - 17.0 g/dL 12.6(L) 12.4(L) 12.4(L)  Hematocrit 39.0 - 52.0 % 38.8(L) 37.4(L) 37.4(L)  Platelets 150 - 400 K/uL 94(L) 91(L) 101(L)    Ref Range & Units 7 d ago  Complement C4, Body Fluid 12 - 38 mg/dL 25     Ref Range & Units 6 d ago  C1INH SerPl-mCnc 21 - 39 mg/dL 40High     BMP Latest Ref Rng & Units 05/10/2020 05/08/2020 05/07/2020  Glucose 70 - 99 mg/dL 92 157(H) 61(L)  BUN 6 - 20 mg/dL 44(H) 76(H) 59(H)  Creatinine 0.61 - 1.24 mg/dL 10.19(H) 14.92(H) 13.56(H)  BUN/Creat Ratio 6 - 22 (calc) - - -  Sodium 135 - 145 mmol/L 129(L) 128(L) 133(L)  Potassium 3.5 - 5.1 mmol/L 4.4 5.3(H) 5.4(H)  Chloride 98 - 111 mmol/L 91(L) 88(L) 90(L)  CO2 22 - 32 mmol/L 23 18(L) 18(L)  Calcium 8.9 - 10.3 mg/dL 8.2(L) 8.1(L) 8.7(L)   FINDINGS: SOFT TISSUE  NECK W CONTRAST  Pharynx and larynx: Larynx soft tissue contours appear stable since 2019. The epiglottis is within normal limits. The hypopharynx is mildly distended with air.   There is new enlargement of the uvula, 2 cm diameter or slightly larger (versus 8-9 mm in 2019) which appears hypodense but homogeneous on series 1, image 41. See also sagittal image 51. However the remainder of the soft palate seems within normal limits.   Other oropharynx soft tissue contours are normal. Nasopharynx appears normal.   Parapharyngeal and retropharyngeal spaces remain within normal limits.   Salivary glands: Sublingual space, submandibular glands and parotid glands are within normal limits.   Thyroid: Negative.   Lymph nodes: Negative.  No lymphadenopathy.   Vascular: The major vascular structures in the neck and at the skull base appear patent. Bilateral carotid calcified atherosclerosis. Distal vertebral artery calcified plaque. The right vertebral appears mildly dominant.   Limited intracranial: Negative.   Visualized orbits: Minimally included, negative.   Mastoids and visualized paranasal sinuses: Clear.   Skeleton: Edentulous. Chronic cervical spine degeneration with mild progression since 2019. No acute osseous abnormality identified.   Upper chest: Reported separately today. Small bilateral layering pleural effusions. No superior mediastinal lymphadenopathy. Mild upper lobe emphysema. Chronic right lateral 2nd rib fracture.   IMPRESSION: 1. Positive for Swollen/Edematous Uvula (uvulitis), which is double or more in size compared to a 2019 CT. This is nonspecific and can be seen with allergy, infection, and trauma. But as the remainder of the pharynx appears normal, angioedema may be most likely.   2. Otherwise negative Neck.  Chest CTA today reported separately.   IMPRESSIONS ECHO  1. Coarse trabeculation of the LV apex, but no thrombus visualized with  Definity  contrast. Left ventricular ejection fraction, by estimation, is  20 to 25%. The left ventricle has severely decreased function. The left  ventricle demonstrates global  hypokinesis. The left ventricular internal cavity size was severely  dilated. Left ventricular diastolic parameters are consistent with Grade I  diastolic dysfunction (impaired relaxation). Elevated left ventricular  end-diastolic pressure.   2. Right ventricular systolic function is normal. The right ventricular  size is normal. There is normal pulmonary artery systolic pressure.   3. Left atrial size was severely dilated.   4. Right atrial size was mildly dilated.   5. 5.5 x 6.4 cm echolucent structure in the liver, likely represents a  cyst. A smaller similar structure is also noted. Dedicated abdominal  imaging is recommended. The trivial pericardial effusion is posterior to  the left ventricle.   6. The mitral valve is degenerative. Severe mitral valve regurgitation.   7. The aortic valve is tricuspid. Aortic valve regurgitation is not  visualized.   8. Aortic dilatation noted. 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   9. The inferior vena cava is normal in size with greater than 50%  respiratory variability, suggesting right atrial pressure of 3 mmHg.   FINDINGS   Left Ventricle: Coarse trabeculation of the LV apex, but no thrombus  visualized with Definity contrast. Left ventricular ejection fraction, by  estimation, is 20 to 25%. The left ventricle has severely decreased  function. The left ventricle demonstrates  global hypokinesis. Definity contrast agent was given IV to delineate the  left ventricular endocardial borders. The left ventricular internal cavity  size was severely dilated. There is no left ventricular hypertrophy. Left  ventricular diastolic  parameters are consistent with Grade I diastolic dysfunction (impaired  relaxation). Elevated left ventricular  end-diastolic pressure.   Right Ventricle: The right ventricular size is normal. No increase in  right ventricular wall thickness. Right ventricular systolic function is  normal. There is normal pulmonary artery systolic pressure. The tricuspid  regurgitant velocity is 1.94 m/s, and   with an assumed right atrial pressure of 3 mmHg, the estimated right  ventricular systolic pressure is 18.2 mmHg.   Left Atrium: Left atrial size was severely dilated.   Right Atrium: Right atrial size was mildly dilated.   Pericardium: 5.5 x 6.4 cm echolucent structure in the liver, likely  represents a cyst. Dedicated abdominal imaging is recommended. Trivial  pericardial effusion is present. The pericardial effusion is posterior to  the left ventricle.   Mitral Valve: The mitral valve is degenerative in appearance. There is  moderate thickening of the mitral valve leaflet(s). Moderately decreased  mobility of the mitral valve leaflets. Severe mitral valve regurgitation,  with centrally-directed jet,  suggesting a mix of primary (leaflet restriction) and predominantly  secondary (functional) MR due to dilated LV and LA.   Tricuspid Valve: The tricuspid valve is grossly normal. Tricuspid valve  regurgitation is trivial.   Aortic Valve: The aortic valve is tricuspid. Aortic valve regurgitation is  not visualized.   Pulmonic Valve: The pulmonic valve was normal in structure. Pulmonic valve  regurgitation is not visualized.   Aorta: Aortic dilatation noted. There is mild to moderate dilatation of  the ascending aorta measuring 42 mm.   Venous: The inferior vena cava is normal in size with greater than 50%  respiratory variability, suggesting right atrial pressure of 3 mmHg.   IAS/Shunts: No atrial level shunt detected by color flow Doppler.      LEFT VENTRICLE  PLAX 2D  LVIDd:         8.30 cm      Diastology  LVIDs:         7.20 cm  LV e' lateral:   6.27 cm/s  LV PW:         0.70 cm       LV E/e' lateral: 20.0  LV IVS:        0.85 cm      LV e' medial:    11.47 cm/s                              LV E/e' medial:  10.9     LV Volumes (MOD)  LV vol d, MOD A2C: 428.0 ml  LV vol d, MOD A4C: 602.0 ml  LV vol s, MOD A2C: 381.0 ml  LV vol s, MOD A4C: 438.0 ml  LV SV MOD A2C:     47.0 ml  LV SV MOD A4C:     602.0 ml  LV SV MOD BP:      99.8 ml   RIGHT VENTRICLE  RV S prime:     12.80 cm/s  TAPSE (M-mode): 2.0 cm   LEFT ATRIUM              Index       RIGHT ATRIUM           Index  LA diam:        7.10 cm  3.45 cm/m  RA Area:     18.70 cm  LA Vol (A2C):   177.0 ml 86.00 ml/m RA Volume:   52.00 ml  25.27 ml/m  LA Vol (A4C):   164.0 ml 79.68 ml/m  LA Biplane Vol: 173.0 ml 84.06 ml/m   AORTIC VALVE  LVOT Vmax:   103.00 cm/s  LVOT Vmean:  70.800 cm/s  LVOT VTI:    0.166 m     AORTA  Ao Root diam: 3.70 cm   MITRAL VALVE                TRICUSPID VALVE  MV Area (PHT): 5.40 cm     TR Peak grad:   15.1 mmHg  MV Decel Time: 141 msec     TR Vmax:        194.00 cm/s  MR Peak grad: 106.1 mmHg  MR Mean grad: 68.0 mmHg     SHUNTS  MR Vmax:      515.00 cm/s   Systemic VTI: 0.17 m  MR Vmean:     388.0 cm/s  MV E velocity: 125.50 cm/s  MV A velocity: 131.50 cm/s  MV E/A ratio:  0.95   Discharge Instructions: Discharge Instructions     Diet - low sodium heart healthy   Complete by: As directed    Increase activity slowly   Complete by: As directed        Signed: Maudie Mercury, MD  05/15/2020, 1:48 PM   Pager: [phone]

## 2020-05-11 NOTE — Progress Notes (Signed)
DISCHARGE NOTE HOME CALLIN ASHE to be discharged Home per MD order. Discussed prescriptions and follow up appointments with the patient. Prescriptions given to patient; medication list explained in detail. Patient verbalized understanding.  Skin clean, dry and intact without evidence of skin break down, no evidence of skin tears noted. IV catheter discontinued intact. Site without signs and symptoms of complications. Dressing and pressure applied. Pt denies pain at the site currently. No complaints noted.  Patient free of lines, drains, and wounds.   An After Visit Summary (AVS) was printed and given to the patient. Patient ambulated himself to the front lobby, and discharged home via public transportation.  Dolores Hoose, RN

## 2020-05-11 NOTE — Plan of Care (Signed)
  Problem: Education: Goal: Knowledge of disease and its progression will improve Outcome: Adequate for Discharge Goal: Individualized Educational Video(s) Outcome: Adequate for Discharge   Problem: Fluid Volume: Goal: Compliance with measures to maintain balanced fluid volume will improve 05/11/2020 1556 by Dolores Hoose, RN Outcome: Adequate for Discharge 05/11/2020 [id number] by Dolores Hoose, RN Outcome: Progressing   Problem: Health Behavior/Discharge Planning: Goal: Ability to manage health-related needs will improve Outcome: Adequate for Discharge   Problem: Nutritional: Goal: Ability to make healthy dietary choices will improve Outcome: Adequate for Discharge   Problem: Clinical Measurements: Goal: Complications related to the disease process, condition or treatment will be avoided or minimized Outcome: Adequate for Discharge

## 2020-05-11 NOTE — Discharge Instructions (Signed)
To Mr. Tabar,  It was a pleasure working with you during your hospital stay. You were diagnosed with uvulitis during your stay. You were managed with antibiotics and benadryl. You will be discharged with benadryl, please take your benadryl every 6 hours (4 times daily). Additionally you will be discharged on an antibiotic Carole Civil) please take one pill after your dialysis session on Friday, and one more pill this Sunday. If you notice any skin changes, fevers, difficulty swallowing food, drinks, saliva, or difficulty breathing, please come back to be reevaluated.

## 2020-05-11 NOTE — Progress Notes (Addendum)
[place] for Warfarin Indication: DVT / PE history   Allergies  Allergen Reactions  . Clindamycin/Lincomycin     Severe pancytopenia   . Chlorhexidine   . Cat Hair Extract Other (See Comments)    SNEEZING   Labs: Recent Labs    05/08/20 1008 05/08/20 1008 05/09/20 0510 05/09/20 0649 05/09/20 0649 05/10/20 0419 05/11/20 0427  HGB 11.6*   < >  --  12.4*   < > 12.4* 12.6*  HCT 34.4*   < >  --  37.4*  --  37.4* 38.8*  PLT 105*   < >  --  101*  --  91* 94*  LABPROT  --   --  25.8*  --   --  18.9* 15.8*  INR  --   --  2.5*  --   --  1.7* 1.3*  CREATININE 14.92*  --   --   --   --  10.19*  --    < > = values in this interval not displayed.    Estimated Creatinine Clearance: 10 mL/min (A) (by C-G formula based on SCr of 10.19 mg/dL (H)).   Assessment: Patient is a 44 yo m on warfarin for recurrent DVT/PE. Admit INR 4.3 with uptrend then rapid downtrend after holding dose x 2d, no reversal agents given. Last outpatient INR 5/17 = 2.4. High admit INR likely due to alcohol intake. Pharmacy has been asked to dose his warfarin while inpatient.   INR 1.3 despite 2 doses due to high home dose and holding on admission. Average daily dose since admit = 4.4mg /d. After 15mg  today average will be up to 6.5mg /d.  H/H, plt low stable.   PTA warfarin dose : 10mg  Mon/Thur, 7.5mg  all other days   Goal of Therapy:  INR 2-3 Monitor platelets by anticoagulation protocol: Yes   Plan:  Warfarin 15mg  x1 today  Consider subcutaneous Heparin 5000 units Q8hr while INR is subtherapeutic Monitor Daily INR, CBC/plt, bleeding   Benetta Spar, PharmD, BCPS, BCCP Clinical Pharmacist  Please check AMION for all [place] phone numbers After 10:00 PM, call [place] [phone]

## 2020-05-11 NOTE — Progress Notes (Signed)
  Echocardiogram 2D Echocardiogram with definity has been performed.  Darlina Sicilian M 05/11/2020, 2:49 PM

## 2020-05-12 DIAGNOSIS — E875 Hyperkalemia: Secondary | ICD-10-CM | POA: Diagnosis not present

## 2020-05-12 DIAGNOSIS — Z992 Dependence on renal dialysis: Secondary | ICD-10-CM | POA: Diagnosis not present

## 2020-05-12 DIAGNOSIS — N2581 Secondary hyperparathyroidism of renal origin: Secondary | ICD-10-CM | POA: Diagnosis not present

## 2020-05-12 DIAGNOSIS — N186 End stage renal disease: Secondary | ICD-10-CM | POA: Diagnosis not present

## 2020-05-15 DIAGNOSIS — N2581 Secondary hyperparathyroidism of renal origin: Secondary | ICD-10-CM | POA: Diagnosis not present

## 2020-05-15 DIAGNOSIS — Z992 Dependence on renal dialysis: Secondary | ICD-10-CM | POA: Diagnosis not present

## 2020-05-15 DIAGNOSIS — E875 Hyperkalemia: Secondary | ICD-10-CM | POA: Diagnosis not present

## 2020-05-15 DIAGNOSIS — N186 End stage renal disease: Secondary | ICD-10-CM | POA: Diagnosis not present

## 2020-05-16 ENCOUNTER — Telehealth: Payer: Self-pay

## 2020-05-16 NOTE — Telephone Encounter (Signed)
Received faxed copy of following INR test results from [facility]:  Test date:    05/14/20 INR results: 3.0  Will forward to Dr. Elie Confer and leave the copy in his box. SChaplin, RN,BSN

## 2020-05-18 ENCOUNTER — Telehealth: Payer: Self-pay | Admitting: Pharmacist

## 2020-05-18 NOTE — Telephone Encounter (Signed)
Was provided PST FS POC INR results today from 5-MAY-21. INR 3.0 (goal 2.0 - 3.0). Decreased from 57.5mg /wk warfarin to 52.5mg /wk warfarin (as 1&1/2x 5mg ) qd. Repeat INR in 2 weeks. No bleeding per patient.

## 2020-05-23 ENCOUNTER — Telehealth: Payer: Self-pay | Admitting: Pharmacist

## 2020-05-23 DIAGNOSIS — Z7901 Long term (current) use of anticoagulants: Secondary | ICD-10-CM | POA: Diagnosis not present

## 2020-05-23 DIAGNOSIS — I2782 Chronic pulmonary embolism: Secondary | ICD-10-CM | POA: Diagnosis not present

## 2020-05-23 DIAGNOSIS — Z86718 Personal history of other venous thrombosis and embolism: Secondary | ICD-10-CM | POA: Diagnosis not present

## 2020-05-23 NOTE — Telephone Encounter (Signed)
Patient texted reporting PST FS POC INR 2.9 on 52.5mg  warfarin/wk. (1&1/2 x 5mg  [7.5mg ] PO qd). Instructed to reduce to 50mg  per week as: 7.5mg  daily--EXCEPT on Wednesdays, take only 5mg  (1 tablet). Repeat INR 21-JUN-21. No bleeding endorsed.

## 2020-06-06 ENCOUNTER — Encounter: Payer: Medicare Other | Admitting: Radiation Oncology

## 2020-06-14 ENCOUNTER — Telehealth: Payer: Self-pay | Admitting: Pharmacist

## 2020-06-14 NOTE — Telephone Encounter (Signed)
Patient reports PST FS POC INR of 3.0 on 50mg  warfarin/wk. Will reduce to 47.5mg  warfarin/wk and repeat PST FS POC INR on 12-JUL-21. Reports not bleeding.

## 2020-06-15 DIAGNOSIS — I120 Hypertensive chronic kidney disease with stage 5 chronic kidney disease or end stage renal disease: Secondary | ICD-10-CM | POA: Diagnosis not present

## 2020-06-15 DIAGNOSIS — Z992 Dependence on renal dialysis: Secondary | ICD-10-CM | POA: Diagnosis not present

## 2020-06-15 DIAGNOSIS — N186 End stage renal disease: Secondary | ICD-10-CM | POA: Diagnosis not present

## 2020-06-16 DIAGNOSIS — N186 End stage renal disease: Secondary | ICD-10-CM | POA: Diagnosis not present

## 2020-06-16 DIAGNOSIS — Z992 Dependence on renal dialysis: Secondary | ICD-10-CM | POA: Diagnosis not present

## 2020-06-16 DIAGNOSIS — E875 Hyperkalemia: Secondary | ICD-10-CM | POA: Diagnosis not present

## 2020-06-16 DIAGNOSIS — N2581 Secondary hyperparathyroidism of renal origin: Secondary | ICD-10-CM | POA: Diagnosis not present

## 2020-06-16 DIAGNOSIS — D631 Anemia in chronic kidney disease: Secondary | ICD-10-CM | POA: Diagnosis not present

## 2020-06-21 DIAGNOSIS — Z992 Dependence on renal dialysis: Secondary | ICD-10-CM | POA: Diagnosis not present

## 2020-06-21 DIAGNOSIS — D631 Anemia in chronic kidney disease: Secondary | ICD-10-CM | POA: Diagnosis not present

## 2020-06-21 DIAGNOSIS — N2581 Secondary hyperparathyroidism of renal origin: Secondary | ICD-10-CM | POA: Diagnosis not present

## 2020-06-21 DIAGNOSIS — N186 End stage renal disease: Secondary | ICD-10-CM | POA: Diagnosis not present

## 2020-06-21 DIAGNOSIS — E875 Hyperkalemia: Secondary | ICD-10-CM | POA: Diagnosis not present

## 2020-06-26 DIAGNOSIS — N2581 Secondary hyperparathyroidism of renal origin: Secondary | ICD-10-CM | POA: Diagnosis not present

## 2020-06-26 DIAGNOSIS — D631 Anemia in chronic kidney disease: Secondary | ICD-10-CM | POA: Diagnosis not present

## 2020-06-26 DIAGNOSIS — Z992 Dependence on renal dialysis: Secondary | ICD-10-CM | POA: Diagnosis not present

## 2020-06-26 DIAGNOSIS — N186 End stage renal disease: Secondary | ICD-10-CM | POA: Diagnosis not present

## 2020-06-26 DIAGNOSIS — E875 Hyperkalemia: Secondary | ICD-10-CM | POA: Diagnosis not present

## 2020-06-28 DIAGNOSIS — E875 Hyperkalemia: Secondary | ICD-10-CM | POA: Diagnosis not present

## 2020-06-28 DIAGNOSIS — N186 End stage renal disease: Secondary | ICD-10-CM | POA: Diagnosis not present

## 2020-06-28 DIAGNOSIS — N2581 Secondary hyperparathyroidism of renal origin: Secondary | ICD-10-CM | POA: Diagnosis not present

## 2020-06-28 DIAGNOSIS — D631 Anemia in chronic kidney disease: Secondary | ICD-10-CM | POA: Diagnosis not present

## 2020-06-28 DIAGNOSIS — Z992 Dependence on renal dialysis: Secondary | ICD-10-CM | POA: Diagnosis not present

## 2020-06-30 DIAGNOSIS — N186 End stage renal disease: Secondary | ICD-10-CM | POA: Diagnosis not present

## 2020-06-30 DIAGNOSIS — N2581 Secondary hyperparathyroidism of renal origin: Secondary | ICD-10-CM | POA: Diagnosis not present

## 2020-06-30 DIAGNOSIS — D631 Anemia in chronic kidney disease: Secondary | ICD-10-CM | POA: Diagnosis not present

## 2020-06-30 DIAGNOSIS — E875 Hyperkalemia: Secondary | ICD-10-CM | POA: Diagnosis not present

## 2020-06-30 DIAGNOSIS — Z992 Dependence on renal dialysis: Secondary | ICD-10-CM | POA: Diagnosis not present

## 2020-07-03 DIAGNOSIS — N186 End stage renal disease: Secondary | ICD-10-CM | POA: Diagnosis not present

## 2020-07-03 DIAGNOSIS — Z992 Dependence on renal dialysis: Secondary | ICD-10-CM | POA: Diagnosis not present

## 2020-07-03 DIAGNOSIS — E875 Hyperkalemia: Secondary | ICD-10-CM | POA: Diagnosis not present

## 2020-07-03 DIAGNOSIS — D631 Anemia in chronic kidney disease: Secondary | ICD-10-CM | POA: Diagnosis not present

## 2020-07-03 DIAGNOSIS — N2581 Secondary hyperparathyroidism of renal origin: Secondary | ICD-10-CM | POA: Diagnosis not present

## 2020-07-05 DIAGNOSIS — N186 End stage renal disease: Secondary | ICD-10-CM | POA: Diagnosis not present

## 2020-07-05 DIAGNOSIS — E875 Hyperkalemia: Secondary | ICD-10-CM | POA: Diagnosis not present

## 2020-07-05 DIAGNOSIS — D631 Anemia in chronic kidney disease: Secondary | ICD-10-CM | POA: Diagnosis not present

## 2020-07-05 DIAGNOSIS — Z992 Dependence on renal dialysis: Secondary | ICD-10-CM | POA: Diagnosis not present

## 2020-07-05 DIAGNOSIS — N2581 Secondary hyperparathyroidism of renal origin: Secondary | ICD-10-CM | POA: Diagnosis not present

## 2020-07-07 DIAGNOSIS — N2581 Secondary hyperparathyroidism of renal origin: Secondary | ICD-10-CM | POA: Diagnosis not present

## 2020-07-07 DIAGNOSIS — E875 Hyperkalemia: Secondary | ICD-10-CM | POA: Diagnosis not present

## 2020-07-07 DIAGNOSIS — D631 Anemia in chronic kidney disease: Secondary | ICD-10-CM | POA: Diagnosis not present

## 2020-07-07 DIAGNOSIS — Z992 Dependence on renal dialysis: Secondary | ICD-10-CM | POA: Diagnosis not present

## 2020-07-07 DIAGNOSIS — N186 End stage renal disease: Secondary | ICD-10-CM | POA: Diagnosis not present

## 2020-07-10 DIAGNOSIS — N2581 Secondary hyperparathyroidism of renal origin: Secondary | ICD-10-CM | POA: Diagnosis not present

## 2020-07-10 DIAGNOSIS — N186 End stage renal disease: Secondary | ICD-10-CM | POA: Diagnosis not present

## 2020-07-10 DIAGNOSIS — E875 Hyperkalemia: Secondary | ICD-10-CM | POA: Diagnosis not present

## 2020-07-10 DIAGNOSIS — D631 Anemia in chronic kidney disease: Secondary | ICD-10-CM | POA: Diagnosis not present

## 2020-07-10 DIAGNOSIS — Z992 Dependence on renal dialysis: Secondary | ICD-10-CM | POA: Diagnosis not present

## 2020-07-14 DIAGNOSIS — N186 End stage renal disease: Secondary | ICD-10-CM | POA: Diagnosis not present

## 2020-07-14 DIAGNOSIS — N2581 Secondary hyperparathyroidism of renal origin: Secondary | ICD-10-CM | POA: Diagnosis not present

## 2020-07-14 DIAGNOSIS — D631 Anemia in chronic kidney disease: Secondary | ICD-10-CM | POA: Diagnosis not present

## 2020-07-14 DIAGNOSIS — E875 Hyperkalemia: Secondary | ICD-10-CM | POA: Diagnosis not present

## 2020-07-14 DIAGNOSIS — Z992 Dependence on renal dialysis: Secondary | ICD-10-CM | POA: Diagnosis not present

## 2020-07-16 DIAGNOSIS — I120 Hypertensive chronic kidney disease with stage 5 chronic kidney disease or end stage renal disease: Secondary | ICD-10-CM | POA: Diagnosis not present

## 2020-07-16 DIAGNOSIS — N186 End stage renal disease: Secondary | ICD-10-CM | POA: Diagnosis not present

## 2020-07-16 DIAGNOSIS — Z992 Dependence on renal dialysis: Secondary | ICD-10-CM | POA: Diagnosis not present

## 2020-07-17 DIAGNOSIS — E875 Hyperkalemia: Secondary | ICD-10-CM | POA: Diagnosis not present

## 2020-07-17 DIAGNOSIS — N2581 Secondary hyperparathyroidism of renal origin: Secondary | ICD-10-CM | POA: Diagnosis not present

## 2020-07-17 DIAGNOSIS — N186 End stage renal disease: Secondary | ICD-10-CM | POA: Diagnosis not present

## 2020-07-17 DIAGNOSIS — D509 Iron deficiency anemia, unspecified: Secondary | ICD-10-CM | POA: Diagnosis not present

## 2020-07-17 DIAGNOSIS — Z992 Dependence on renal dialysis: Secondary | ICD-10-CM | POA: Diagnosis not present

## 2020-07-18 ENCOUNTER — Other Ambulatory Visit: Payer: Self-pay

## 2020-07-18 ENCOUNTER — Encounter: Payer: Self-pay | Admitting: Student

## 2020-07-18 ENCOUNTER — Ambulatory Visit (INDEPENDENT_AMBULATORY_CARE_PROVIDER_SITE_OTHER): Payer: Medicare Other | Admitting: Student

## 2020-07-18 DIAGNOSIS — F17210 Nicotine dependence, cigarettes, uncomplicated: Secondary | ICD-10-CM | POA: Diagnosis not present

## 2020-07-18 DIAGNOSIS — E46 Unspecified protein-calorie malnutrition: Secondary | ICD-10-CM | POA: Diagnosis not present

## 2020-07-18 DIAGNOSIS — I712 Thoracic aortic aneurysm, without rupture, unspecified: Secondary | ICD-10-CM

## 2020-07-18 DIAGNOSIS — F17200 Nicotine dependence, unspecified, uncomplicated: Secondary | ICD-10-CM

## 2020-07-18 DIAGNOSIS — B20 Human immunodeficiency virus [HIV] disease: Secondary | ICD-10-CM | POA: Diagnosis not present

## 2020-07-18 DIAGNOSIS — Z79891 Long term (current) use of opiate analgesic: Secondary | ICD-10-CM | POA: Diagnosis not present

## 2020-07-18 DIAGNOSIS — G2581 Restless legs syndrome: Secondary | ICD-10-CM | POA: Diagnosis not present

## 2020-07-18 DIAGNOSIS — Q613 Polycystic kidney, unspecified: Secondary | ICD-10-CM

## 2020-07-18 DIAGNOSIS — Z86711 Personal history of pulmonary embolism: Secondary | ICD-10-CM

## 2020-07-18 MED ORDER — DRONABINOL 5 MG PO CAPS: 5.0000 mg | ORAL_CAPSULE | Freq: Every day | ORAL | 5 refills | Status: DC

## 2020-07-18 MED ORDER — OXYCODONE-ACETAMINOPHEN 10-325 MG PO TABS: 1.0000 | ORAL_TABLET | Freq: Three times a day (TID) | ORAL | 0 refills | Status: DC | PRN

## 2020-07-18 MED ORDER — ROPINIROLE HCL 2 MG PO TABS: 2.0000 mg | ORAL_TABLET | Freq: Every day | ORAL | 3 refills | Status: DC

## 2020-07-18 NOTE — Assessment & Plan Note (Signed)
Patient reports having stopped smoking cigarettes after recent hospitalization for PE in May 2021.

## 2020-07-18 NOTE — Assessment & Plan Note (Signed)
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 Patients weight has increased from 172lbs to 191lbs over past few months.  -refilled Marinol

## 2020-07-18 NOTE — Assessment & Plan Note (Signed)
Patient with history of multiple unprovoked VTE. Currently on treatment with Coumadin. Monitoring his INR with home testing kit and communicates with Dr. Johney Maine. INR was 2.9 last week. No signs or symptoms of significant or active bleeding.  -Continue coumadin therapy as per Dr. Johney Maine

## 2020-07-18 NOTE — Assessment & Plan Note (Signed)
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 Appears stable. Will need repeat CTA chest next year (2022) for annual screening.

## 2020-07-18 NOTE — Patient Instructions (Signed)
Brian [name], it was a pleasure seeing you today. I am glad to see that you are doing well and have stopped smoking cigarettes!  We discussed that you have been feeling well since your last visit and your chronic conditions appear to be stable.  I have sent refills for your percocet, ropinorole, and marinol to your local pharmacy.  Once again, it was a pleasure seeing you today.

## 2020-07-18 NOTE — Assessment & Plan Note (Signed)
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 Currently on oxycodone-acetaminophen 10-325mg  q8h prn for pain. He has no acute complaints. Controlled substance database checked and findings are as expected and appropriate. UDS performed in May 2021 show appropriate findings. On stable dosing.  -refilled 1 month supply of percocets -patient is on stable dosing so can refill as needed.

## 2020-07-18 NOTE — Assessment & Plan Note (Addendum)
History of restless leg syndrome, stable on Requip 2mg  qhs. Iron levels closely monitored by nephrology as patient on dialysis.  -refilled requip 2mg  qhs
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
## 2020-07-18 NOTE — Assessment & Plan Note (Signed)
Doing well on current regimen of Descovy and Tivicay. No medication changes indicated at this time.

## 2020-07-19 DIAGNOSIS — E875 Hyperkalemia: Secondary | ICD-10-CM | POA: Diagnosis not present

## 2020-07-19 DIAGNOSIS — N186 End stage renal disease: Secondary | ICD-10-CM | POA: Diagnosis not present

## 2020-07-19 DIAGNOSIS — Z992 Dependence on renal dialysis: Secondary | ICD-10-CM | POA: Diagnosis not present

## 2020-07-19 DIAGNOSIS — D509 Iron deficiency anemia, unspecified: Secondary | ICD-10-CM | POA: Diagnosis not present

## 2020-07-19 DIAGNOSIS — N2581 Secondary hyperparathyroidism of renal origin: Secondary | ICD-10-CM | POA: Diagnosis not present

## 2020-07-20 NOTE — Progress Notes (Signed)
Internal Medicine Clinic Attending  I saw and evaluated the patient.  I personally confirmed the key portions of the history and exam documented by Dr. Jinwala and I reviewed pertinent patient test results.  The assessment, diagnosis, and plan were formulated together and I agree with the documentation in the resident's note.  

## 2020-07-21 DIAGNOSIS — E875 Hyperkalemia: Secondary | ICD-10-CM | POA: Diagnosis not present

## 2020-07-21 DIAGNOSIS — Z992 Dependence on renal dialysis: Secondary | ICD-10-CM | POA: Diagnosis not present

## 2020-07-21 DIAGNOSIS — D509 Iron deficiency anemia, unspecified: Secondary | ICD-10-CM | POA: Diagnosis not present

## 2020-07-21 DIAGNOSIS — N2581 Secondary hyperparathyroidism of renal origin: Secondary | ICD-10-CM | POA: Diagnosis not present

## 2020-07-21 DIAGNOSIS — N186 End stage renal disease: Secondary | ICD-10-CM | POA: Diagnosis not present

## 2020-07-24 DIAGNOSIS — Z992 Dependence on renal dialysis: Secondary | ICD-10-CM | POA: Diagnosis not present

## 2020-07-24 DIAGNOSIS — N186 End stage renal disease: Secondary | ICD-10-CM | POA: Diagnosis not present

## 2020-07-24 DIAGNOSIS — N2581 Secondary hyperparathyroidism of renal origin: Secondary | ICD-10-CM | POA: Diagnosis not present

## 2020-07-24 DIAGNOSIS — D509 Iron deficiency anemia, unspecified: Secondary | ICD-10-CM | POA: Diagnosis not present

## 2020-07-24 DIAGNOSIS — D631 Anemia in chronic kidney disease: Secondary | ICD-10-CM | POA: Diagnosis not present

## 2020-07-25 ENCOUNTER — Telehealth: Payer: Self-pay | Admitting: Pharmacist

## 2020-07-25 ENCOUNTER — Telehealth: Payer: Self-pay | Admitting: *Deleted

## 2020-07-25 NOTE — Telephone Encounter (Signed)
Patient calls to report PST FS POC INR value of 2.8 on 1&1/2 x 5mg  (7.5mg  dose) all days--EXCEPT on Wednesdays and Saturday, take only one (5mg  dose) on these days. Repeat PST FS POC INR in two weeks, 24-AUG-21. No report of bleeding events or thromboembolic events.

## 2020-07-25 NOTE — Telephone Encounter (Signed)
Received fax from [facility] with INR results from 01/04/2020 to 07/25/2020. Last INR 2.8 on 07/25/2020. Results placed in Dr. Gladstone Pih office. Hubbard Hartshorn, BSN, RN-BC

## 2020-07-26 DIAGNOSIS — D509 Iron deficiency anemia, unspecified: Secondary | ICD-10-CM | POA: Diagnosis not present

## 2020-07-26 DIAGNOSIS — D631 Anemia in chronic kidney disease: Secondary | ICD-10-CM | POA: Diagnosis not present

## 2020-07-26 DIAGNOSIS — N186 End stage renal disease: Secondary | ICD-10-CM | POA: Diagnosis not present

## 2020-07-26 DIAGNOSIS — N2581 Secondary hyperparathyroidism of renal origin: Secondary | ICD-10-CM | POA: Diagnosis not present

## 2020-07-26 DIAGNOSIS — Z992 Dependence on renal dialysis: Secondary | ICD-10-CM | POA: Diagnosis not present

## 2020-07-28 DIAGNOSIS — Z992 Dependence on renal dialysis: Secondary | ICD-10-CM | POA: Diagnosis not present

## 2020-07-28 DIAGNOSIS — N186 End stage renal disease: Secondary | ICD-10-CM | POA: Diagnosis not present

## 2020-07-28 DIAGNOSIS — D631 Anemia in chronic kidney disease: Secondary | ICD-10-CM | POA: Diagnosis not present

## 2020-07-28 DIAGNOSIS — D509 Iron deficiency anemia, unspecified: Secondary | ICD-10-CM | POA: Diagnosis not present

## 2020-07-28 DIAGNOSIS — N2581 Secondary hyperparathyroidism of renal origin: Secondary | ICD-10-CM | POA: Diagnosis not present

## 2020-07-29 DIAGNOSIS — N186 End stage renal disease: Secondary | ICD-10-CM | POA: Diagnosis not present

## 2020-07-29 DIAGNOSIS — D631 Anemia in chronic kidney disease: Secondary | ICD-10-CM | POA: Diagnosis not present

## 2020-07-29 DIAGNOSIS — D509 Iron deficiency anemia, unspecified: Secondary | ICD-10-CM | POA: Diagnosis not present

## 2020-07-29 DIAGNOSIS — Z992 Dependence on renal dialysis: Secondary | ICD-10-CM | POA: Diagnosis not present

## 2020-07-29 DIAGNOSIS — N2581 Secondary hyperparathyroidism of renal origin: Secondary | ICD-10-CM | POA: Diagnosis not present

## 2020-07-31 DIAGNOSIS — Z992 Dependence on renal dialysis: Secondary | ICD-10-CM | POA: Diagnosis not present

## 2020-07-31 DIAGNOSIS — D631 Anemia in chronic kidney disease: Secondary | ICD-10-CM | POA: Diagnosis not present

## 2020-07-31 DIAGNOSIS — N2581 Secondary hyperparathyroidism of renal origin: Secondary | ICD-10-CM | POA: Diagnosis not present

## 2020-07-31 DIAGNOSIS — N186 End stage renal disease: Secondary | ICD-10-CM | POA: Diagnosis not present

## 2020-07-31 DIAGNOSIS — D509 Iron deficiency anemia, unspecified: Secondary | ICD-10-CM | POA: Diagnosis not present

## 2020-08-02 ENCOUNTER — Telehealth: Payer: Self-pay | Admitting: *Deleted

## 2020-08-02 DIAGNOSIS — D509 Iron deficiency anemia, unspecified: Secondary | ICD-10-CM | POA: Diagnosis not present

## 2020-08-02 DIAGNOSIS — N2581 Secondary hyperparathyroidism of renal origin: Secondary | ICD-10-CM | POA: Diagnosis not present

## 2020-08-02 DIAGNOSIS — Z992 Dependence on renal dialysis: Secondary | ICD-10-CM | POA: Diagnosis not present

## 2020-08-02 DIAGNOSIS — N186 End stage renal disease: Secondary | ICD-10-CM | POA: Diagnosis not present

## 2020-08-02 DIAGNOSIS — D631 Anemia in chronic kidney disease: Secondary | ICD-10-CM | POA: Diagnosis not present

## 2020-08-02 NOTE — Telephone Encounter (Signed)
Received fax from [facility] with INR results from 01/13/2020 to 08/01/2020. Last INR 2.7 on 08/01/2020. Results placed in Dr. Gladstone Pih office. Hubbard Hartshorn, BSN, RN-BC

## 2020-08-04 ENCOUNTER — Telehealth: Payer: Self-pay | Admitting: Pharmacist

## 2020-08-04 DIAGNOSIS — D631 Anemia in chronic kidney disease: Secondary | ICD-10-CM | POA: Diagnosis not present

## 2020-08-04 DIAGNOSIS — Z992 Dependence on renal dialysis: Secondary | ICD-10-CM | POA: Diagnosis not present

## 2020-08-04 DIAGNOSIS — N2581 Secondary hyperparathyroidism of renal origin: Secondary | ICD-10-CM | POA: Diagnosis not present

## 2020-08-04 DIAGNOSIS — D509 Iron deficiency anemia, unspecified: Secondary | ICD-10-CM | POA: Diagnosis not present

## 2020-08-04 DIAGNOSIS — N186 End stage renal disease: Secondary | ICD-10-CM | POA: Diagnosis not present

## 2020-08-04 NOTE — Telephone Encounter (Signed)
Called patient to report INR made available to me today of 2.7 on 47.5mg  warfarin/wk. Continue same regimen. Repeat INR on Monday 13-SEP-21.

## 2020-08-07 ENCOUNTER — Ambulatory Visit (HOSPITAL_COMMUNITY): Admission: EM | Admit: 2020-08-07 | Discharge: 2020-08-07 | Payer: Medicare Other

## 2020-08-07 ENCOUNTER — Other Ambulatory Visit: Payer: Self-pay

## 2020-08-07 ENCOUNTER — Emergency Department (HOSPITAL_COMMUNITY): Payer: Medicare Other

## 2020-08-07 ENCOUNTER — Inpatient Hospital Stay (HOSPITAL_COMMUNITY)
Admission: EM | Admit: 2020-08-07 | Discharge: 2020-08-09 | DRG: 177 | Disposition: A | Discharge status: Home / Self Care | Payer: Medicare Other | Attending: Internal Medicine | Admitting: Internal Medicine

## 2020-08-07 DIAGNOSIS — R000 Tachycardia, unspecified: Secondary | ICD-10-CM | POA: Diagnosis not present

## 2020-08-07 DIAGNOSIS — E875 Hyperkalemia: Secondary | ICD-10-CM | POA: Diagnosis not present

## 2020-08-07 DIAGNOSIS — D709 Neutropenia, unspecified: Secondary | ICD-10-CM | POA: Diagnosis not present

## 2020-08-07 DIAGNOSIS — F17210 Nicotine dependence, cigarettes, uncomplicated: Secondary | ICD-10-CM | POA: Diagnosis present

## 2020-08-07 DIAGNOSIS — Z7901 Long term (current) use of anticoagulants: Secondary | ICD-10-CM

## 2020-08-07 DIAGNOSIS — G2581 Restless legs syndrome: Secondary | ICD-10-CM | POA: Diagnosis present

## 2020-08-07 DIAGNOSIS — Z8249 Family history of ischemic heart disease and other diseases of the circulatory system: Secondary | ICD-10-CM

## 2020-08-07 DIAGNOSIS — E872 Acidosis, unspecified: Secondary | ICD-10-CM | POA: Diagnosis present

## 2020-08-07 DIAGNOSIS — D509 Iron deficiency anemia, unspecified: Secondary | ICD-10-CM | POA: Diagnosis not present

## 2020-08-07 DIAGNOSIS — I132 Hypertensive heart and chronic kidney disease with heart failure and with stage 5 chronic kidney disease, or end stage renal disease: Secondary | ICD-10-CM | POA: Diagnosis present

## 2020-08-07 DIAGNOSIS — G8929 Other chronic pain: Secondary | ICD-10-CM | POA: Diagnosis present

## 2020-08-07 DIAGNOSIS — Z992 Dependence on renal dialysis: Secondary | ICD-10-CM

## 2020-08-07 DIAGNOSIS — Z8701 Personal history of pneumonia (recurrent): Secondary | ICD-10-CM

## 2020-08-07 DIAGNOSIS — U071 COVID-19: Principal | ICD-10-CM | POA: Diagnosis present

## 2020-08-07 DIAGNOSIS — I491 Atrial premature depolarization: Secondary | ICD-10-CM | POA: Diagnosis not present

## 2020-08-07 DIAGNOSIS — Z888 Allergy status to other drugs, medicaments and biological substances status: Secondary | ICD-10-CM

## 2020-08-07 DIAGNOSIS — Z86711 Personal history of pulmonary embolism: Secondary | ICD-10-CM

## 2020-08-07 DIAGNOSIS — Z79899 Other long term (current) drug therapy: Secondary | ICD-10-CM

## 2020-08-07 DIAGNOSIS — I428 Other cardiomyopathies: Secondary | ICD-10-CM | POA: Diagnosis present

## 2020-08-07 DIAGNOSIS — R531 Weakness: Secondary | ICD-10-CM | POA: Diagnosis not present

## 2020-08-07 DIAGNOSIS — I5022 Chronic systolic (congestive) heart failure: Secondary | ICD-10-CM | POA: Diagnosis present

## 2020-08-07 DIAGNOSIS — F329 Major depressive disorder, single episode, unspecified: Secondary | ICD-10-CM | POA: Diagnosis present

## 2020-08-07 DIAGNOSIS — I959 Hypotension, unspecified: Secondary | ICD-10-CM | POA: Diagnosis not present

## 2020-08-07 DIAGNOSIS — Z881 Allergy status to other antibiotic agents status: Secondary | ICD-10-CM

## 2020-08-07 DIAGNOSIS — N186 End stage renal disease: Secondary | ICD-10-CM | POA: Diagnosis not present

## 2020-08-07 DIAGNOSIS — Z905 Acquired absence of kidney: Secondary | ICD-10-CM

## 2020-08-07 DIAGNOSIS — A419 Sepsis, unspecified organism: Secondary | ICD-10-CM | POA: Diagnosis present

## 2020-08-07 DIAGNOSIS — I517 Cardiomegaly: Secondary | ICD-10-CM | POA: Diagnosis not present

## 2020-08-07 DIAGNOSIS — R6521 Severe sepsis with septic shock: Secondary | ICD-10-CM | POA: Diagnosis present

## 2020-08-07 DIAGNOSIS — Z7289 Other problems related to lifestyle: Secondary | ICD-10-CM

## 2020-08-07 DIAGNOSIS — B20 Human immunodeficiency virus [HIV] disease: Secondary | ICD-10-CM | POA: Diagnosis present

## 2020-08-07 DIAGNOSIS — Z21 Asymptomatic human immunodeficiency virus [HIV] infection status: Secondary | ICD-10-CM | POA: Diagnosis present

## 2020-08-07 DIAGNOSIS — Z91048 Other nonmedicinal substance allergy status: Secondary | ICD-10-CM

## 2020-08-07 DIAGNOSIS — N2581 Secondary hyperparathyroidism of renal origin: Secondary | ICD-10-CM | POA: Diagnosis not present

## 2020-08-07 DIAGNOSIS — J449 Chronic obstructive pulmonary disease, unspecified: Secondary | ICD-10-CM | POA: Diagnosis present

## 2020-08-07 DIAGNOSIS — R571 Hypovolemic shock: Secondary | ICD-10-CM | POA: Diagnosis present

## 2020-08-07 DIAGNOSIS — R42 Dizziness and giddiness: Secondary | ICD-10-CM | POA: Diagnosis not present

## 2020-08-07 DIAGNOSIS — F1290 Cannabis use, unspecified, uncomplicated: Secondary | ICD-10-CM | POA: Diagnosis present

## 2020-08-07 DIAGNOSIS — K219 Gastro-esophageal reflux disease without esophagitis: Secondary | ICD-10-CM | POA: Diagnosis present

## 2020-08-07 DIAGNOSIS — I712 Thoracic aortic aneurysm, without rupture: Secondary | ICD-10-CM | POA: Diagnosis present

## 2020-08-07 DIAGNOSIS — D61818 Other pancytopenia: Secondary | ICD-10-CM | POA: Diagnosis present

## 2020-08-07 DIAGNOSIS — Z86718 Personal history of other venous thrombosis and embolism: Secondary | ICD-10-CM

## 2020-08-07 LAB — COMPREHENSIVE METABOLIC PANEL
ALT: 21 U/L (ref 0–44)
AST: 43 U/L — ABNORMAL HIGH (ref 15–41)
Albumin: 3.4 g/dL — ABNORMAL LOW (ref 3.5–5.0)
Alkaline Phosphatase: 91 U/L (ref 38–126)
Anion gap: 20 — ABNORMAL HIGH (ref 5–15)
BUN: 40 mg/dL — ABNORMAL HIGH (ref 6–20)
CO2: 22 mmol/L (ref 22–32)
Calcium: 8.6 mg/dL — ABNORMAL LOW (ref 8.9–10.3)
Chloride: 90 mmol/L — ABNORMAL LOW (ref 98–111)
Creatinine, Ser: 7.07 mg/dL — ABNORMAL HIGH (ref 0.61–1.24)
GFR calc Af Amer: 10 mL/min — ABNORMAL LOW (ref >60)
GFR calc non Af Amer: 8 mL/min — ABNORMAL LOW (ref >60)
Glucose, Bld: 98 mg/dL (ref 70–99)
Potassium: 4.2 mmol/L (ref 3.5–5.1)
Sodium: 132 mmol/L — ABNORMAL LOW (ref 135–145)
Total Bilirubin: 0.9 mg/dL (ref 0.3–1.2)
Total Protein: 7 g/dL (ref 6.5–8.1)

## 2020-08-07 LAB — CBC WITH DIFFERENTIAL/PLATELET
Abs Immature Granulocytes: 0.01 10*3/uL (ref 0.00–0.07)
Basophils Absolute: 0 10*3/uL (ref 0.0–0.1)
Basophils Relative: 0 %
Eosinophils Absolute: 0 10*3/uL (ref 0.0–0.5)
Eosinophils Relative: 0 %
HCT: 40.1 % (ref 39.0–52.0)
Hemoglobin: 13.4 g/dL (ref 13.0–17.0)
Immature Granulocytes: 1 %
Lymphocytes Relative: 30 %
Lymphs Abs: 0.3 10*3/uL — ABNORMAL LOW (ref 0.7–4.0)
MCH: 35.4 pg — ABNORMAL HIGH (ref 26.0–34.0)
MCHC: 33.4 g/dL (ref 30.0–36.0)
MCV: 105.8 fL — ABNORMAL HIGH (ref 80.0–100.0)
Monocytes Absolute: 0.2 10*3/uL (ref 0.1–1.0)
Monocytes Relative: 20 %
Neutro Abs: 0.5 10*3/uL — ABNORMAL LOW (ref 1.7–7.7)
Neutrophils Relative %: 49 %
Platelets: 80 10*3/uL — ABNORMAL LOW (ref 150–400)
RBC: 3.79 MIL/uL — ABNORMAL LOW (ref 4.22–5.81)
RDW: 16.6 % — ABNORMAL HIGH (ref 11.5–15.5)
WBC: 1.1 10*3/uL — CL (ref 4.0–10.5)
nRBC: 0 % (ref 0.0–0.2)

## 2020-08-07 LAB — SARS CORONAVIRUS 2 BY RT PCR (HOSPITAL ORDER, PERFORMED IN ~~LOC~~ HOSPITAL LAB): SARS Coronavirus 2: POSITIVE — AB

## 2020-08-07 LAB — LACTIC ACID, PLASMA
Lactic Acid, Venous: 2.3 mmol/L (ref 0.5–1.9)
Lactic Acid, Venous: 1.4 mmol/L (ref 0.5–1.9)

## 2020-08-07 MED ORDER — OXYCODONE-ACETAMINOPHEN 5-325 MG PO TABS
2.0000 | ORAL_TABLET | Freq: Once | ORAL | Status: AC
  Administered 2020-08-07: 2 via ORAL
  Filled 2020-08-07: qty 2

## 2020-08-07 MED ORDER — LACTATED RINGERS IV BOLUS (SEPSIS)
250.0000 mL | Freq: Once | INTRAVENOUS | Status: AC
  Administered 2020-08-07: 250 mL via INTRAVENOUS

## 2020-08-07 MED ORDER — SODIUM CHLORIDE 0.9 % IV SOLN
500.0000 mg | INTRAVENOUS | Status: DC
  Administered 2020-08-07: 500 mg via INTRAVENOUS
  Filled 2020-08-07: qty 500

## 2020-08-07 MED ORDER — ONDANSETRON HCL 4 MG/2ML IJ SOLN: 4.0000 mg | Freq: Four times a day (QID) | INTRAMUSCULAR | Status: DC | PRN

## 2020-08-07 MED ORDER — LACTATED RINGERS IV SOLN: INTRAVENOUS | Status: DC

## 2020-08-07 MED ORDER — SODIUM CHLORIDE 0.9 % IV SOLN
200.0000 mg | Freq: Once | INTRAVENOUS | Status: AC
  Administered 2020-08-08: 200 mg via INTRAVENOUS
  Filled 2020-08-07: qty 40

## 2020-08-07 MED ORDER — ONDANSETRON HCL 4 MG PO TABS: 4.0000 mg | ORAL_TABLET | Freq: Four times a day (QID) | ORAL | Status: DC | PRN

## 2020-08-07 MED ORDER — SODIUM CHLORIDE 0.9 % IV SOLN
100.0000 mg | Freq: Every day | INTRAVENOUS | Status: DC
  Administered 2020-08-08 – 2020-08-09 (×2): 100 mg via INTRAVENOUS
  Filled 2020-08-07: qty 100
  Filled 2020-08-07: qty 20

## 2020-08-07 MED ORDER — HEPARIN SODIUM (PORCINE) 5000 UNIT/ML IJ SOLN
5000.0000 [IU] | Freq: Three times a day (TID) | INTRAMUSCULAR | Status: DC
  Administered 2020-08-08 (×2): 5000 [IU] via SUBCUTANEOUS
  Filled 2020-08-07 (×2): qty 1

## 2020-08-07 MED ORDER — ACETAMINOPHEN 325 MG PO TABS: 650.0000 mg | ORAL_TABLET | Freq: Four times a day (QID) | ORAL | Status: DC | PRN

## 2020-08-07 MED ORDER — DEXAMETHASONE 4 MG PO TABS
6.0000 mg | ORAL_TABLET | ORAL | Status: DC
  Administered 2020-08-08: 6 mg via ORAL
  Filled 2020-08-07: qty 2

## 2020-08-07 MED ORDER — SODIUM CHLORIDE 0.9 % IV SOLN
2.0000 g | INTRAVENOUS | Status: DC
  Administered 2020-08-07: 2 g via INTRAVENOUS
  Filled 2020-08-07: qty 20

## 2020-08-07 NOTE — ED Notes (Signed)
RN notified MD of pt's critical WBC.

## 2020-08-07 NOTE — ED Triage Notes (Signed)
Pt arrives from Urgent Care via EMS with complaints of dizziness. Pt on HD on M/W/F which he does not miss. After HD pt felt dizzy and SOB. Pt endorses feeling lousy and had around 4-5 runs of VTach. Pt alert and oriented X4  77/32 HR 114 97% Nasal cap with EMS 24

## 2020-08-07 NOTE — ED Notes (Signed)
Patient is being discharged from the Urgent Care and sent to the Emergency Department via EMS . Per provider Molli Barrows, patient is in need of higher level of care due to unstable vitals & medical Hx. Patient is aware and verbalizes understanding of plan of care.   Vitals:   08/07/20 1634  BP: (!) 76/22  Pulse: 61  Temp: 100 F (37.8 C)  SpO2: 98%
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
## 2020-08-07 NOTE — ED Notes (Addendum)
RN notifeid MD of pt BP trending downwards. New order to be placed
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
## 2020-08-08 ENCOUNTER — Encounter (HOSPITAL_COMMUNITY): Payer: Self-pay | Admitting: Internal Medicine

## 2020-08-08 DIAGNOSIS — E872 Acidosis, unspecified: Secondary | ICD-10-CM | POA: Diagnosis present

## 2020-08-08 DIAGNOSIS — F17210 Nicotine dependence, cigarettes, uncomplicated: Secondary | ICD-10-CM | POA: Diagnosis present

## 2020-08-08 DIAGNOSIS — I509 Heart failure, unspecified: Secondary | ICD-10-CM | POA: Diagnosis not present

## 2020-08-08 DIAGNOSIS — U071 COVID-19: Secondary | ICD-10-CM | POA: Diagnosis present

## 2020-08-08 DIAGNOSIS — D709 Neutropenia, unspecified: Secondary | ICD-10-CM | POA: Diagnosis not present

## 2020-08-08 DIAGNOSIS — N186 End stage renal disease: Secondary | ICD-10-CM | POA: Diagnosis not present

## 2020-08-08 DIAGNOSIS — G8929 Other chronic pain: Secondary | ICD-10-CM | POA: Diagnosis present

## 2020-08-08 DIAGNOSIS — K219 Gastro-esophageal reflux disease without esophagitis: Secondary | ICD-10-CM | POA: Diagnosis present

## 2020-08-08 DIAGNOSIS — I712 Thoracic aortic aneurysm, without rupture: Secondary | ICD-10-CM | POA: Diagnosis present

## 2020-08-08 DIAGNOSIS — I2782 Chronic pulmonary embolism: Secondary | ICD-10-CM | POA: Diagnosis not present

## 2020-08-08 DIAGNOSIS — I132 Hypertensive heart and chronic kidney disease with heart failure and with stage 5 chronic kidney disease, or end stage renal disease: Secondary | ICD-10-CM | POA: Diagnosis present

## 2020-08-08 DIAGNOSIS — I5022 Chronic systolic (congestive) heart failure: Secondary | ICD-10-CM

## 2020-08-08 DIAGNOSIS — I428 Other cardiomyopathies: Secondary | ICD-10-CM | POA: Diagnosis present

## 2020-08-08 DIAGNOSIS — I959 Hypotension, unspecified: Secondary | ICD-10-CM

## 2020-08-08 DIAGNOSIS — Z21 Asymptomatic human immunodeficiency virus [HIV] infection status: Secondary | ICD-10-CM | POA: Diagnosis present

## 2020-08-08 DIAGNOSIS — N250 Renal osteodystrophy: Secondary | ICD-10-CM | POA: Diagnosis not present

## 2020-08-08 DIAGNOSIS — Z881 Allergy status to other antibiotic agents status: Secondary | ICD-10-CM | POA: Diagnosis not present

## 2020-08-08 DIAGNOSIS — R112 Nausea with vomiting, unspecified: Secondary | ICD-10-CM | POA: Diagnosis not present

## 2020-08-08 DIAGNOSIS — Z888 Allergy status to other drugs, medicaments and biological substances status: Secondary | ICD-10-CM | POA: Diagnosis not present

## 2020-08-08 DIAGNOSIS — Z992 Dependence on renal dialysis: Secondary | ICD-10-CM | POA: Diagnosis not present

## 2020-08-08 DIAGNOSIS — J449 Chronic obstructive pulmonary disease, unspecified: Secondary | ICD-10-CM | POA: Diagnosis present

## 2020-08-08 DIAGNOSIS — Z86718 Personal history of other venous thrombosis and embolism: Secondary | ICD-10-CM | POA: Diagnosis not present

## 2020-08-08 DIAGNOSIS — R0602 Shortness of breath: Secondary | ICD-10-CM | POA: Diagnosis not present

## 2020-08-08 DIAGNOSIS — Z8701 Personal history of pneumonia (recurrent): Secondary | ICD-10-CM | POA: Diagnosis not present

## 2020-08-08 DIAGNOSIS — R578 Other shock: Secondary | ICD-10-CM | POA: Diagnosis not present

## 2020-08-08 DIAGNOSIS — R571 Hypovolemic shock: Secondary | ICD-10-CM | POA: Diagnosis present

## 2020-08-08 DIAGNOSIS — D61818 Other pancytopenia: Secondary | ICD-10-CM | POA: Diagnosis present

## 2020-08-08 DIAGNOSIS — Z7901 Long term (current) use of anticoagulants: Secondary | ICD-10-CM | POA: Diagnosis not present

## 2020-08-08 DIAGNOSIS — F1290 Cannabis use, unspecified, uncomplicated: Secondary | ICD-10-CM | POA: Diagnosis present

## 2020-08-08 DIAGNOSIS — G2581 Restless legs syndrome: Secondary | ICD-10-CM | POA: Diagnosis present

## 2020-08-08 DIAGNOSIS — F329 Major depressive disorder, single episode, unspecified: Secondary | ICD-10-CM | POA: Diagnosis present

## 2020-08-08 DIAGNOSIS — Z86711 Personal history of pulmonary embolism: Secondary | ICD-10-CM | POA: Diagnosis not present

## 2020-08-08 HISTORY — DX: COVID-19: U07.1

## 2020-08-08 LAB — BLOOD CULTURE ID PANEL (REFLEXED) - BCID2

## 2020-08-08 LAB — FERRITIN: Ferritin: 1599 ng/mL — ABNORMAL HIGH (ref 24–336)

## 2020-08-08 LAB — CBC WITH DIFFERENTIAL/PLATELET
Abs Immature Granulocytes: 0.01 10*3/uL (ref 0.00–0.07)
Basophils Absolute: 0 10*3/uL (ref 0.0–0.1)
Basophils Relative: 0 %
Eosinophils Absolute: 0 10*3/uL (ref 0.0–0.5)
Eosinophils Relative: 0 %
HCT: 33.3 % — ABNORMAL LOW (ref 39.0–52.0)
Hemoglobin: 11 g/dL — ABNORMAL LOW (ref 13.0–17.0)
Immature Granulocytes: 1 %
Lymphocytes Relative: 31 %
Lymphs Abs: 0.3 10*3/uL — ABNORMAL LOW (ref 0.7–4.0)
MCH: 35.7 pg — ABNORMAL HIGH (ref 26.0–34.0)
MCHC: 33 g/dL (ref 30.0–36.0)
MCV: 108.1 fL — ABNORMAL HIGH (ref 80.0–100.0)
Monocytes Absolute: 0.1 10*3/uL (ref 0.1–1.0)
Monocytes Relative: 10 %
Neutro Abs: 0.7 10*3/uL — ABNORMAL LOW (ref 1.7–7.7)
Neutrophils Relative %: 58 %
Platelets: 64 10*3/uL — ABNORMAL LOW (ref 150–400)
RBC: 3.08 MIL/uL — ABNORMAL LOW (ref 4.22–5.81)
RDW: 16.8 % — ABNORMAL HIGH (ref 11.5–15.5)
WBC: 1.1 10*3/uL — CL (ref 4.0–10.5)
nRBC: 0 % (ref 0.0–0.2)

## 2020-08-08 LAB — RENAL FUNCTION PANEL
Albumin: 2.9 g/dL — ABNORMAL LOW (ref 3.5–5.0)
Anion gap: 16 — ABNORMAL HIGH (ref 5–15)
BUN: 47 mg/dL — ABNORMAL HIGH (ref 6–20)
CO2: 23 mmol/L (ref 22–32)
Calcium: 8.1 mg/dL — ABNORMAL LOW (ref 8.9–10.3)
Chloride: 93 mmol/L — ABNORMAL LOW (ref 98–111)
Creatinine, Ser: 8.24 mg/dL — ABNORMAL HIGH (ref 0.61–1.24)
GFR calc Af Amer: 8 mL/min — ABNORMAL LOW (ref >60)
GFR calc non Af Amer: 7 mL/min — ABNORMAL LOW (ref >60)
Glucose, Bld: 119 mg/dL — ABNORMAL HIGH (ref 70–99)
Phosphorus: 5.9 mg/dL — ABNORMAL HIGH (ref 2.5–4.6)
Potassium: 4.7 mmol/L (ref 3.5–5.1)
Sodium: 132 mmol/L — ABNORMAL LOW (ref 135–145)

## 2020-08-08 LAB — C-REACTIVE PROTEIN: CRP: 7 mg/dL — ABNORMAL HIGH (ref <1.0)

## 2020-08-08 LAB — PROTIME-INR
INR: 2.7 — ABNORMAL HIGH (ref 0.8–1.2)
Prothrombin Time: 28.2 seconds — ABNORMAL HIGH (ref 11.4–15.2)

## 2020-08-08 LAB — PATHOLOGIST SMEAR REVIEW

## 2020-08-08 LAB — APTT: aPTT: 75 seconds — ABNORMAL HIGH (ref 24–36)

## 2020-08-08 LAB — D-DIMER, QUANTITATIVE: D-Dimer, Quant: 0.52 ug/mL-FEU — ABNORMAL HIGH (ref 0.00–0.50)

## 2020-08-08 LAB — MAGNESIUM: Magnesium: 1.8 mg/dL (ref 1.7–2.4)

## 2020-08-08 MED ORDER — OXYCODONE HCL 5 MG PO TABS
5.0000 mg | ORAL_TABLET | Freq: Three times a day (TID) | ORAL | Status: DC | PRN
  Administered 2020-08-08: 5 mg via ORAL
  Filled 2020-08-08 (×2): qty 1

## 2020-08-08 MED ORDER — SODIUM CHLORIDE 0.9 % IV SOLN: INTRAVENOUS | Status: DC

## 2020-08-08 MED ORDER — LACTATED RINGERS IV BOLUS
1000.0000 mL | Freq: Once | INTRAVENOUS | Status: AC
  Administered 2020-08-08: 1000 mL via INTRAVENOUS

## 2020-08-08 MED ORDER — OXYCODONE HCL 5 MG PO TABS
5.0000 mg | ORAL_TABLET | Freq: Three times a day (TID) | ORAL | Status: DC | PRN
  Administered 2020-08-08: 5 mg via ORAL
  Filled 2020-08-08: qty 1

## 2020-08-08 MED ORDER — LACTULOSE ENCEPHALOPATHY 10 GM/15ML PO SOLN
20.0000 g | Freq: Every day | ORAL | Status: DC | PRN
  Filled 2020-08-08: qty 30

## 2020-08-08 MED ORDER — WARFARIN SODIUM 7.5 MG PO TABS
7.5000 mg | ORAL_TABLET | Freq: Every day | ORAL | Status: DC
  Administered 2020-08-08: 7.5 mg via ORAL
  Filled 2020-08-08 (×3): qty 1

## 2020-08-08 MED ORDER — FERRIC CITRATE 1 GM 210 MG(FE) PO TABS
420.0000 mg | ORAL_TABLET | Freq: Three times a day (TID) | ORAL | Status: DC
  Administered 2020-08-08 – 2020-08-09 (×3): 420 mg via ORAL
  Filled 2020-08-08 (×6): qty 2

## 2020-08-08 MED ORDER — OXYCODONE-ACETAMINOPHEN 10-325 MG PO TABS: 1.0000 | ORAL_TABLET | Freq: Three times a day (TID) | ORAL | Status: DC | PRN

## 2020-08-08 MED ORDER — ROPINIROLE HCL 1 MG PO TABS
2.0000 mg | ORAL_TABLET | Freq: Every day | ORAL | Status: DC
  Administered 2020-08-08 – 2020-08-09 (×2): 2 mg via ORAL
  Filled 2020-08-08 (×3): qty 2

## 2020-08-08 MED ORDER — DOLUTEGRAVIR SODIUM 50 MG PO TABS
50.0000 mg | ORAL_TABLET | Freq: Every day | ORAL | Status: DC
  Administered 2020-08-08 – 2020-08-09 (×2): 50 mg via ORAL
  Filled 2020-08-08 (×3): qty 1

## 2020-08-08 MED ORDER — DRONABINOL 2.5 MG PO CAPS
5.0000 mg | ORAL_CAPSULE | Freq: Every day | ORAL | Status: DC
  Administered 2020-08-08 – 2020-08-09 (×2): 5 mg via ORAL
  Filled 2020-08-08: qty 2
  Filled 2020-08-08: qty 1
  Filled 2020-08-08: qty 2

## 2020-08-08 MED ORDER — OXYCODONE-ACETAMINOPHEN 5-325 MG PO TABS
1.0000 | ORAL_TABLET | Freq: Three times a day (TID) | ORAL | Status: DC | PRN
  Administered 2020-08-08: 1 via ORAL
  Filled 2020-08-08 (×2): qty 1

## 2020-08-08 MED ORDER — LACTATED RINGERS IV BOLUS
500.0000 mL | Freq: Once | INTRAVENOUS | Status: AC
  Administered 2020-08-08: 500 mL via INTRAVENOUS

## 2020-08-08 MED ORDER — EMTRICITABINE-TENOFOVIR AF 200-25 MG PO TABS
1.0000 | ORAL_TABLET | Freq: Every day | ORAL | Status: DC
  Administered 2020-08-08 – 2020-08-09 (×2): 1 via ORAL
  Filled 2020-08-08 (×3): qty 1

## 2020-08-08 MED ORDER — OXYCODONE-ACETAMINOPHEN 10-325 MG PO TABS
1.0000 | ORAL_TABLET | Freq: Three times a day (TID) | ORAL | Status: DC | PRN
Start: 2020-08-08 — End: 2020-08-08

## 2020-08-08 MED ORDER — FERRIC CITRATE 1 GM 210 MG(FE) PO TABS
210.0000 mg | ORAL_TABLET | Freq: Two times a day (BID) | ORAL | Status: DC | PRN
  Filled 2020-08-08: qty 1

## 2020-08-08 MED ORDER — OXYCODONE-ACETAMINOPHEN 5-325 MG PO TABS
1.0000 | ORAL_TABLET | Freq: Three times a day (TID) | ORAL | Status: DC | PRN
  Administered 2020-08-08: 1 via ORAL
  Filled 2020-08-08: qty 1

## 2020-08-08 MED ORDER — WARFARIN - PHYSICIAN DOSING INPATIENT: Freq: Every day | Status: DC

## 2020-08-08 MED ORDER — ALBUTEROL SULFATE HFA 108 (90 BASE) MCG/ACT IN AERS: 2.0000 | INHALATION_SPRAY | Freq: Four times a day (QID) | RESPIRATORY_TRACT | Status: DC | PRN
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
## 2020-08-08 NOTE — Progress Notes (Signed)
  Date: 08/08/2020  Patient name: [name]  Medical record number: [id number]  Date of birth: 10-29-1976        I have seen and evaluated this patient and I have discussed the plan of care with the house staff. Please see Dr. Janne Napoleon note for complete details. I concur with her findings and plan.    Sid Falcon, MD 08/08/2020, 7:20 PM

## 2020-08-08 NOTE — Progress Notes (Signed)
   Subjective: Overnight, Brian [name] remained fairly hypotensive requiring additional fluid boluses.  On our evaluation this afternoon, he was feeling much better. He notes he had diarrhea right after finishing dialysis and subsequently had a near-syncopal event. He has not had much of an appetite the last several days. He has been able to eat well today and walk around the room without symptoms. We discussed the option of being discharged to complete course of remdesivir as an outpatient versus staying another day to dialyze here in the hospital and ensure he does not develop recurrent hypotension. He elects for the latter which we agreed was a good idea.   Objective:  Vital signs in last 24 hours: Vitals:   08/08/20 1515 08/08/20 1530 08/08/20 1545 08/08/20 1600  BP:    103/72  Pulse: 90 85 93 88  Resp:    18  Temp:      TempSrc:      SpO2: 93% 95% 95% 95%   General: awake, alert, sitting up in bed in NAD CV: RRR; SEM; LUE AVF with good thrill Pulm: normal work of breathing on room air; lungs CTAB   Assessment/Plan:  Principal Problem:   COVID-19 Active Problems:   HIV disease (HCC)   ESRD (end stage renal disease) (HCC)   Chronic systolic heart failure (HCC)   Nonischemic cardiomyopathy (HCC)   Septic shock (HCC)   Lactic acidosis   Neutropenia ([place])  Brian [name] is a 44 year old man with past medical history of polycystic kidney disease, ESRD (MWF dialysis), HIV (undetectable viral load, on Descovy and Tivicay), COPD, 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   Hypovolemic shock 2/2 GI losses from COVID-19 infection compounded by recent hemodialysis treatment -resolved after receiving several fluid boluses to total around 2L -blood pressures have returned to around his baseline -diarrhea is improving which appears to be his primary symptom from a COVID standpoint -he is not requiring oxygen  so do not think he needs course of decadron -given his immune compromised state, would recommend him completing 5 day course of Remdesivir -will hold of on further fluids at this time and allow him to take in PO -if his blood pressures remain stable and continues to feel well, will likely be stable for discharge after HD tomorrow   ESRD on HD MWF -appreciate nephrology managing HD needs while inpatient -labs are stable, and he does not show signs of volume overload;  He can continue on usual schedule tomorrow    Leukopenia and thrombocytopenia  -likely in the setting of acute COVID-19 infection -will continue to trend CBCs   Dispo: Anticipated discharge in approximately 1 day(s).   Modena Nunnery D, DO 08/08/2020, 5:44 PM Pager: [phone] After 5pm on weekdays and 1pm on weekends: On Call pager [phone]

## 2020-08-08 NOTE — Progress Notes (Signed)
PHARMACY - PHYSICIAN COMMUNICATION CRITICAL VALUE ALERT - BLOOD CULTURE IDENTIFICATION (BCID)  Brian [name] is an 44 y.o. male who presented to [facility] on 08/07/2020 with a chief complaint of COVID.  Assessment:  1/4 blood culture bottles growing staph species - likely contaminant  Name of physician (or Provider) Contacted: Dr. Myna Hidalgo  Current antibiotics: None  Changes to prescribed antibiotics recommended:  No antibiotics recommended at this time  Results for orders placed or performed during the hospital encounter of 08/07/20  Blood Culture ID Panel (Reflexed) (Collected: 08/08/2020 12:45 AM)  Result Value Ref Range   Enterococcus faecalis NOT DETECTED NOT DETECTED   Enterococcus Faecium NOT DETECTED NOT DETECTED   Listeria monocytogenes NOT DETECTED NOT DETECTED   Staphylococcus species DETECTED (A) NOT DETECTED   Staphylococcus aureus (BCID) NOT DETECTED NOT DETECTED   Staphylococcus epidermidis NOT DETECTED NOT DETECTED   Staphylococcus lugdunensis NOT DETECTED NOT DETECTED   Streptococcus species NOT DETECTED NOT DETECTED   Streptococcus agalactiae NOT DETECTED NOT DETECTED   Streptococcus pneumoniae NOT DETECTED NOT DETECTED   Streptococcus pyogenes NOT DETECTED NOT DETECTED   A.calcoaceticus-baumannii NOT DETECTED NOT DETECTED   Bacteroides fragilis NOT DETECTED NOT DETECTED   Enterobacterales NOT DETECTED NOT DETECTED   Enterobacter cloacae complex NOT DETECTED NOT DETECTED   Escherichia coli NOT DETECTED NOT DETECTED   Klebsiella aerogenes NOT DETECTED NOT DETECTED   Klebsiella oxytoca NOT DETECTED NOT DETECTED   Klebsiella pneumoniae NOT DETECTED NOT DETECTED   Proteus species NOT DETECTED NOT DETECTED   Salmonella species NOT DETECTED NOT DETECTED   Serratia marcescens NOT DETECTED NOT DETECTED   Haemophilus influenzae NOT DETECTED NOT DETECTED   Neisseria meningitidis NOT DETECTED NOT DETECTED   Pseudomonas aeruginosa NOT DETECTED NOT DETECTED    Stenotrophomonas maltophilia NOT DETECTED NOT DETECTED   Candida albicans NOT DETECTED NOT DETECTED   Candida auris NOT DETECTED NOT DETECTED   Candida glabrata NOT DETECTED NOT DETECTED   Candida krusei NOT DETECTED NOT DETECTED   Candida parapsilosis NOT DETECTED NOT DETECTED   Candida tropicalis NOT DETECTED NOT DETECTED   Cryptococcus neoformans/gattii NOT DETECTED NOT DETECTED    Sherlon Handing, PharmD, BCPS Please see amion for complete clinical pharmacist phone list 08/08/2020  11:56 PM

## 2020-08-08 NOTE — Progress Notes (Addendum)
Renal Navigator notes patient's positive COVID test result from 08/07/20 and notified OP HD clinic/[facility]. Patient will treat in isolation shift at home clinic MWF 3rd shift at discharge. Navigator will follow up with clinic, Nephrologist and patient.  Alphonzo Cruise, [place] Renal Navigator [phone]

## 2020-08-09 LAB — RENAL FUNCTION PANEL
Albumin: 2.7 g/dL — ABNORMAL LOW (ref 3.5–5.0)
Anion gap: 17 — ABNORMAL HIGH (ref 5–15)
BUN: 69 mg/dL — ABNORMAL HIGH (ref 6–20)
CO2: 23 mmol/L (ref 22–32)
Calcium: 7.9 mg/dL — ABNORMAL LOW (ref 8.9–10.3)
Chloride: 92 mmol/L — ABNORMAL LOW (ref 98–111)
Creatinine, Ser: 9.78 mg/dL — ABNORMAL HIGH (ref 0.61–1.24)
GFR calc Af Amer: 7 mL/min — ABNORMAL LOW (ref >60)
GFR calc non Af Amer: 6 mL/min — ABNORMAL LOW (ref >60)
Glucose, Bld: 106 mg/dL — ABNORMAL HIGH (ref 70–99)
Phosphorus: 8.5 mg/dL — ABNORMAL HIGH (ref 2.5–4.6)
Potassium: 4.5 mmol/L (ref 3.5–5.1)
Sodium: 132 mmol/L — ABNORMAL LOW (ref 135–145)

## 2020-08-09 LAB — CBC WITH DIFFERENTIAL/PLATELET
Abs Immature Granulocytes: 0.02 10*3/uL (ref 0.00–0.07)
Basophils Absolute: 0 10*3/uL (ref 0.0–0.1)
Basophils Relative: 1 %
Eosinophils Absolute: 0 10*3/uL (ref 0.0–0.5)
Eosinophils Relative: 0 %
HCT: 35.6 % — ABNORMAL LOW (ref 39.0–52.0)
Hemoglobin: 11.5 g/dL — ABNORMAL LOW (ref 13.0–17.0)
Immature Granulocytes: 1 %
Lymphocytes Relative: 41 %
Lymphs Abs: 0.7 10*3/uL (ref 0.7–4.0)
MCH: 35.4 pg — ABNORMAL HIGH (ref 26.0–34.0)
MCHC: 32.3 g/dL (ref 30.0–36.0)
MCV: 109.5 fL — ABNORMAL HIGH (ref 80.0–100.0)
Monocytes Absolute: 0.2 10*3/uL (ref 0.1–1.0)
Monocytes Relative: 12 %
Neutro Abs: 0.7 10*3/uL — ABNORMAL LOW (ref 1.7–7.7)
Neutrophils Relative %: 45 %
Platelets: 66 10*3/uL — ABNORMAL LOW (ref 150–400)
RBC: 3.25 MIL/uL — ABNORMAL LOW (ref 4.22–5.81)
RDW: 16.8 % — ABNORMAL HIGH (ref 11.5–15.5)
WBC: 1.6 10*3/uL — ABNORMAL LOW (ref 4.0–10.5)
nRBC: 0 % (ref 0.0–0.2)

## 2020-08-09 LAB — D-DIMER, QUANTITATIVE: D-Dimer, Quant: 0.41 ug/mL-FEU (ref 0.00–0.50)

## 2020-08-09 LAB — FERRITIN: Ferritin: 1953 ng/mL — ABNORMAL HIGH (ref 24–336)

## 2020-08-09 LAB — MAGNESIUM: Magnesium: 2 mg/dL (ref 1.7–2.4)

## 2020-08-09 LAB — PROTIME-INR
INR: 2.2 — ABNORMAL HIGH (ref 0.8–1.2)
Prothrombin Time: 23.5 seconds — ABNORMAL HIGH (ref 11.4–15.2)

## 2020-08-09 LAB — C-REACTIVE PROTEIN: CRP: 6.8 mg/dL — ABNORMAL HIGH (ref <1.0)

## 2020-08-09 MED ORDER — LACTATED RINGERS IV BOLUS
1000.0000 mL | Freq: Once | INTRAVENOUS | Status: AC
  Administered 2020-08-09: 1000 mL via INTRAVENOUS

## 2020-08-09 MED ORDER — HEPARIN SODIUM (PORCINE) 1000 UNIT/ML IJ SOLN
INTRAMUSCULAR | Status: AC
  Administered 2020-08-09: 5000 [IU]
  Filled 2020-08-09: qty 5

## 2020-08-09 MED ORDER — SODIUM CHLORIDE 0.9 % IV SOLN: INTRAVENOUS | Status: DC

## 2020-08-09 NOTE — Progress Notes (Signed)
[facility] Progress Note  Subjective:  Patient not examined today directly given COVID-19 + status, utilizing data taken from chart +/- discussions w/ providers and staff.    Vitals:   08/09/20 1030 08/09/20 1100 08/09/20 1130 08/09/20 1327  BP: 106/67 102/68 113/68 110/85  Pulse: 85 89 81 98  Resp:   18 18  Temp:   97.6 F (36.4 C)   TempSrc:   Oral   SpO2:   95% 97%    Exam:  Patient not examined today directly given COVID-19 + status, utilizing data taken from chart +/- discussions w/ providers and staff.    Home meds:  - coumadin 7.5 hs  - requip 2mg  hs/ oxy-aceta tid prn/ marinol 5 mg qd  - auryxia 1-2 ac tid  - dolutegravir 50 hs/ emtricitabine-tenofovir AF 200-25 qd  - prn's/ vitamins/ supplements    OP HD: MWF G-O  4h   79.5kg    2/2.25 bath   Hep 5000   LUA AVF  - calc 2.25 ug tiw  - mircera none  IMPRESSION: Cardiomegaly with interstitial prominence. This could reflect chronic interstitial lung disease or interstitial edema. Favor edema.   Assessment/ Plan: 1. Shock/ hypotension - due to N/V/ diarrhea, COVID infection - per pmd is getting some IVF's.   2. COVID+ infection - getting remdesivir+ IV steroids, not on supp O2 3. ESRD - d/t PKD. Gets HD every MWF.  Had HD this am upstairs, still doesn't have a room yet.  4. CHF EF 25% - don't have any wts yet due to ED stay. Kept even on HD today, BP's soft. No edema on last exam.  5. Anemia ckd - Hb 11 here, no need esa 6. MBD ckd - cont meds 7. HIV - on meds       Rob [place] 08/09/2020, 1:55 PM   Recent Labs  Lab 08/08/20 0557 08/09/20 0628  K 4.7 4.5  BUN 47* 69*  CREATININE 8.24* 9.78*  CALCIUM 8.1* 7.9*  PHOS 5.9* 8.5*  HGB 11.0* 11.5*   Inpatient medications: . dolutegravir  50 mg Oral Daily  . dronabinol  5 mg Oral QAC lunch  . emtricitabine-tenofovir AF  1 tablet Oral Daily  . ferric citrate  420 mg Oral TID WC  . rOPINIRole  2 mg Oral QHS  . warfarin  7.5 mg  Oral q1600  . Warfarin - Physician Dosing Inpatient   Does not apply q1600   . sodium chloride 50 mL/hr at 08/09/20 0310  . remdesivir 100 mg in NS 100 mL 100 mg (08/09/20 1342)   acetaminophen, albuterol, ferric citrate, lactulose (encephalopathy), ondansetron **OR** ondansetron (ZOFRAN) IV, oxyCODONE-acetaminophen **AND** oxyCODONE

## 2020-08-09 NOTE — Progress Notes (Signed)
  Date: 08/09/2020  Patient name: Brian [name]  Medical record number: [id number]  Date of birth: 10-28-1976        I have seen and evaluated this patient and I have discussed the plan of care with the house staff. Please see Dr. Janne Napoleon note for complete details. I concur with her findings and plan for discharge.  Return precautions given to the patient.   Sid Falcon, MD 08/09/2020, 7:32 PM

## 2020-08-09 NOTE — Progress Notes (Signed)
Patient scheduled for outpatient Remdesivir infusions at 12pm on Thursday 8/26, Friday 8/27, and Saturday 8/28 at [facility]. Please inform the patient to park at [place], as staff will be escorting the patient through the [place] entrance of the hospital.    There is a wave flag banner located near the entrance on [address]. Turn into this entrance and immediately turn left and park in 1 of the 5 designated Covid Infusion Parking spots. There is a phone number on the sign, please call and let the staff know what spot you are in and we will come out and get you. For questions call [phone].  Thanks.

## 2020-08-09 NOTE — ED Notes (Signed)
Reviewed discharge instructions with patient. Follow-up care and infusion center directions reviewed. Patient verbalized understanding. Patient A&Ox4, VSS, and ambulatory with steady gait upon discharge.

## 2020-08-09 NOTE — Progress Notes (Signed)
   Subjective: Brian [name] was slightly hypotensive early this morning, but responded well to a small bolus. He was seen after dialysis which went well. He has not had any dizziness with standing. Diarrhea has resolved. He feels well and is comfortable with going home to continue recovering.   Objective:  Vital signs in last 24 hours: Vitals:   08/09/20 1100 08/09/20 1130 08/09/20 1327 08/09/20 1432  BP: 102/68 113/68 110/85 (!) 132/93  Pulse: 89 81 98 87  Resp:  18 18 17   Temp:  97.6 F (36.4 C)  98.5 F (36.9 C)  TempSrc:  Oral  Oral  SpO2:  95% 97% 98%   General: awake, alert, sitting up in bed in NAD Pulm: normal work of breathing on room air; lungs CTAB   Assessment/Plan:  Principal Problem:   COVID-19 Active Problems:   HIV disease (HCC)   ESRD (end stage renal disease) (HCC)   Chronic systolic heart failure (HCC)   Hypotension   Nonischemic cardiomyopathy (HCC)   Septic shock (HCC)   Lactic acidosis   Neutropenia ([place])  Brian [name] is a 44 year old man with past medical history of polycystic kidney disease, ESRD (MWF dialysis), HIV (undetectable viral load, on Descovy and Tivicay), COPD, 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   Hypovolemic shock 2/2 GI losses from COVID-19 infection compounded by recent hemodialysis treatment - resolved -blood pressure has remained stable, apart from transient decrease overnight -was kept net event at dialysis today -he has not had any further loose stools -stable for discharge to continue recovery at home -we will arrange for him to complete remaining 3 remdesivir treatments as an outpatient   ESRD on HD MWF -appreciate nephrology managing HD needs while inpatient -he will continue normal dialysis schedule with COVID precautions  -greatly appreciate assistance from renal navigator ensuring this is arranged prior to d/c   Leukopenia and  thrombocytopenia  -likely in the setting of acute COVID-19 infection -continue to monitor for improvement as an outpatient   Dispo: Anticipated discharge home today.    Modena Nunnery D, DO 08/09/2020, 2:49 PM Pager: [phone] After 5pm on weekdays and 1pm on weekends: On Call pager [phone]

## 2020-08-09 NOTE — ED Notes (Signed)
Pt transported to dialysis

## 2020-08-09 NOTE — ED Notes (Signed)
Called Dauda RN in dialysis to get report on pt. Per Dauda RN, pt received 3 hrs of dialysis, no fluid was removed d/t BP, blood was filtered for toxins. Last set of VS 113/68, HR 81, 97.6, 95% RA.

## 2020-08-09 NOTE — Discharge Summary (Signed)
Name: Brian [name] MRN: [id number] DOB: [birth year] 44 y.o. PCP: Brian Axe, MD  Date of Admission: 08/07/2020  5:09 PM Date of Discharge: 08/09/20 Attending Physician: Brian Falcon, MD  Discharge Diagnosis: 1. Hypovolemic shock 2. COVID-19  3. Leukopenia, thrombocytopenia   Discharge Medications: Allergies as of 08/09/2020      Reactions   Clindamycin/lincomycin Other (See Comments)   Severe pancytopenia (condition in which there is a lower-than-normal number of red and white blood cells and platelets in the blood)   Chlorhexidine Hives   Adhesive [tape] Rash, Other (See Comments)   Skin stays scarred- doesn't heal well   Cat Hair Extract Other (See Comments)   Sneezing      Medication List    STOP taking these medications   cefdinir 300 MG capsule Commonly known as: OMNICEF     TAKE these medications   albuterol 108 (90 Base) MCG/ACT inhaler Commonly known as: VENTOLIN HFA INHALE 2 PUFFS INTO THE LUNGS EVERY 6 HOURS AS NEEDED FOR WHEEZING OR SHORTNESS OF BREATH What changed:   how much to take  how to take this  when to take this  reasons to take this  additional instructions   Auryxia 1 GM 210 MG(Fe) tablet Generic drug: ferric citrate Take 210-420 mg by mouth See admin instructions. Take 420 mg by mouth three times daily before meals and 210 mg before each snack   Descovy 200-25 MG tablet Generic drug: emtricitabine-tenofovir AF Take 1 tablet by mouth daily. What changed: when to take this   diphenhydrAMINE 25 mg capsule Commonly known as: BENADRYL Take 1 capsule (25 mg total) by mouth every 6 (six) hours.   Banophen 25 MG tablet Generic drug: diphenhydrAMINE Take 25 mg by mouth every 6 (six) hours.   dronabinol 5 MG capsule Commonly known as: MARINOL Take 1 capsule (5 mg total) by mouth daily before lunch.   fluticasone 50 MCG/ACT nasal spray Commonly known as: FLONASE Place 2 sprays into both nostrils daily. What changed:   when  to take this  reasons to take this   Generlac 10 GM/15ML Soln Generic drug: lactulose (encephalopathy) Take 20 g by mouth daily as needed (constipation).   oxyCODONE-acetaminophen 10-325 MG tablet Commonly known as: Percocet Take 1 tablet by mouth every 8 (eight) hours as needed for pain. What changed: when to take this   Ricola w/Echinacea 3.5 MG Lozg Generic drug: Menthol Use as directed 1 lozenge in the mouth or throat as needed (for throat discomfort).   rOPINIRole 2 MG tablet Commonly known as: REQUIP Take 1 tablet (2 mg total) by mouth at bedtime for 2 days. What changed: when to take this   Tivicay 50 MG tablet Generic drug: dolutegravir Take 1 tablet (50 mg total) by mouth daily. What changed: when to take this   warfarin 5 MG tablet Commonly known as: COUMADIN Take as directed. If you are unsure how to take this medication, talk to your nurse or doctor. Original instructions: TAKE 1 AND 1/2 TABLETS(7.5 MG) BY MOUTH DAILY What changed: See the new instructions.       Disposition and follow-up:   [name] was discharged from [facility] in Stable condition.  At the hospital follow up visit please address:  1.  COVID-19: admitted with hypovolemic shock 2/2 GI losses from COVID-19. He did not have an oxygen requirement so was not started on steroids. He completed 2 doses of remdesivir and was scheduled to finish the remaining  3 treatments at the outpatient infusion center.  Please ensure he was able to receive infusions.  ESRD: Ensure he has been able to do new dialysis schedule per covid protocol Thrombocytopenia, Leukopenia: suspected to have acutely worsened due to COVID-19. Will need repeat labs after he completes quarantine.   2.  Labs / imaging needed at time of follow-up: CBC  3.  Pending labs/ test needing follow-up: none  Follow-up Appointments:  Follow-up Information    [facility] Follow up on  08/11/2020.   Why: Your next outpatient dialysis is scheduled for Friday at [facility] will need to treat in isolation on a 3rd shift, MWF at 5pm until your clinic staff notify you that you can return to your normal seat time. Please call clinic [phone] when you arrive. Contact information: [place] [address] [phone]               Hospital Course by problem list: [name]. 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   Hypovolemic shock 2/2 GI losses from COVID-19 infection compounded by recent hemodialysis treatment - resolved Patient presented with MAPS in 55-60s requiring multiple boluses. Suspected to be due to GI losses from COVID-19, as well as undergoing hemodialysis. Blood pressures subsequently improved and remained stable by day of discharge. Diarrhea had resolved and he was tolerating PO well. Arranged for him to complete remaining 3 remdesivir treatments as an outpatient. Quarantine for COVID-19 ends 08/28/20.   Leukopenia and thrombocytopenia  -likely in the setting of acute COVID-19 infection -continue to monitor for improvement as an outpatient   Discharge Vitals:   BP (!) 132/93 (BP Location: Right Arm)   Pulse 87   Temp 98.5 F (36.9 C) (Oral)   Resp 17   SpO2 98%   Pertinent Labs, Studies, and Procedures:  CBC Latest Ref Rng & Units 08/09/2020 08/08/2020 08/07/2020  WBC 4.0 - 10.5 K/uL 1.6(L) 1.1(LL) 1.1(LL)  Hemoglobin 13.0 - 17.0 g/dL 11.5(L) 11.0(L) 13.4  Hematocrit 39 - 52 % 35.6(L) 33.3(L) 40.1  Platelets 150 - 400 K/uL 66(L) 64(L) 80(L)     Discharge Instructions: Discharge Instructions    Diet - low sodium heart healthy   Complete by: As directed    Discharge instructions   Complete by: As  directed    [name], It was a pleasure taking care of you. Glad you are feeling better!  We will set you up to complete your Remdesivir treatments at the infusion center.   You will do dialysis on your usual days as you expected, just after 5 pm. You will need to call from the parking lot when you arrive.   I will have the clinic set up a telehealth for you next week!   Your quarantine period will end 08/28/20.   Take care!   Increase activity slowly   Complete by: As directed       Signed: Delice Bison, DO 08/09/2020, 3:04 PM   Pager: [phone]

## 2020-08-09 NOTE — ED Notes (Signed)
Ordered breakfast 

## 2020-08-09 NOTE — Progress Notes (Signed)
Renal Navigator appreciates message from Primary/Dr. Koleen Distance to notify that patient will be discharged from the ED today. He will be set up at the [facility] to finish course of Remdesivir.  His OP HD will be in isolation for 21 days from positive test or when he has gotten two negative tests at his [employer].  His next OP HD will be Friday, 08/11/20 and he needs to arrive to the parking lot at 5pm and call the clinic at [phone] before entering. Navigator put this information in the AVS and left patient a voicemail. Navigator has notified Renal PA of patient's discharge and patient's clinic that they should expect him Friday.  Alphonzo Cruise, [place] Renal Navigator [phone]

## 2020-08-09 NOTE — Discharge Instructions (Signed)
Patient scheduled for outpatient Remdesivir infusions at 12pm on Thursday 8/26, Friday 8/27, and Saturday 8/28 at [facility] will take approximately 45 minutes. Please inform the patient to park at [place], as staff will be escorting the patient through the [place] entrance of the hospital.    There is a wave flag banner located near the entrance on [address]. Turn into this entrance and immediately turn left and park in 1 of the 5 designated Covid Infusion Parking spots. There is a phone number on the sign, please call and let the staff know what spot you are in and we will come out and get you. For questions call [phone].  Thanks.

## 2020-08-10 ENCOUNTER — Telehealth: Payer: Self-pay | Admitting: Nephrology

## 2020-08-10 ENCOUNTER — Ambulatory Visit (HOSPITAL_COMMUNITY)
Admit: 2020-08-10 | Discharge: 2020-08-10 | Disposition: A | Discharge status: Home / Self Care | Payer: Medicare Other | Attending: Pulmonary Disease | Admitting: Pulmonary Disease

## 2020-08-10 ENCOUNTER — Encounter (HOSPITAL_COMMUNITY): Payer: Self-pay | Admitting: Nephrology

## 2020-08-10 ENCOUNTER — Telehealth: Payer: Self-pay | Admitting: *Deleted

## 2020-08-10 DIAGNOSIS — Z992 Dependence on renal dialysis: Secondary | ICD-10-CM | POA: Diagnosis not present

## 2020-08-10 DIAGNOSIS — U071 COVID-19: Secondary | ICD-10-CM | POA: Insufficient documentation

## 2020-08-10 DIAGNOSIS — D509 Iron deficiency anemia, unspecified: Secondary | ICD-10-CM | POA: Diagnosis not present

## 2020-08-10 DIAGNOSIS — N186 End stage renal disease: Secondary | ICD-10-CM | POA: Diagnosis not present

## 2020-08-10 DIAGNOSIS — N2581 Secondary hyperparathyroidism of renal origin: Secondary | ICD-10-CM | POA: Diagnosis not present

## 2020-08-10 DIAGNOSIS — E875 Hyperkalemia: Secondary | ICD-10-CM | POA: Diagnosis not present

## 2020-08-10 DIAGNOSIS — J1282 Pneumonia due to coronavirus disease 2019: Secondary | ICD-10-CM | POA: Diagnosis not present

## 2020-08-10 DIAGNOSIS — I120 Hypertensive chronic kidney disease with stage 5 chronic kidney disease or end stage renal disease: Secondary | ICD-10-CM | POA: Diagnosis not present

## 2020-08-10 LAB — CULTURE, BLOOD (ROUTINE X 2)

## 2020-08-10 MED ORDER — EPINEPHRINE 0.3 MG/0.3ML IJ SOAJ: 0.3000 mg | Freq: Once | INTRAMUSCULAR | Status: DC | PRN

## 2020-08-10 MED ORDER — DIPHENHYDRAMINE HCL 50 MG/ML IJ SOLN: 50.0000 mg | Freq: Once | INTRAMUSCULAR | Status: DC | PRN

## 2020-08-10 MED ORDER — FAMOTIDINE IN NACL 20-0.9 MG/50ML-% IV SOLN: 20.0000 mg | Freq: Once | INTRAVENOUS | Status: DC | PRN

## 2020-08-10 MED ORDER — ALBUTEROL SULFATE HFA 108 (90 BASE) MCG/ACT IN AERS: 2.0000 | INHALATION_SPRAY | Freq: Once | RESPIRATORY_TRACT | Status: DC | PRN

## 2020-08-10 MED ORDER — SODIUM CHLORIDE 0.9 % IV SOLN: INTRAVENOUS | Status: DC | PRN

## 2020-08-10 MED ORDER — METHYLPREDNISOLONE SODIUM SUCC 125 MG IJ SOLR: 125.0000 mg | Freq: Once | INTRAMUSCULAR | Status: DC | PRN

## 2020-08-10 MED ORDER — SODIUM CHLORIDE 0.9 % IV SOLN
100.0000 mg | Freq: Once | INTRAVENOUS | Status: AC
  Administered 2020-08-10: 100 mg via INTRAVENOUS
  Filled 2020-08-10: qty 20

## 2020-08-10 NOTE — Progress Notes (Signed)
  Diagnosis: COVID-19  Physician: Dr. Asencion Noble  Procedure: Covid Infusion Clinic Med: remdesivir infusion - Provided patient with remdesivir fact sheet for patients, parents and caregivers prior to infusion.  Complications: No immediate complications noted.  Discharge: Discharged home   Gaye Alken 08/10/2020

## 2020-08-10 NOTE — Progress Notes (Signed)
This encounter was created in error - please disregard.

## 2020-08-10 NOTE — Telephone Encounter (Signed)
Late entry from telephone call made today at 5:15pm: Transition of care contact from inpatient facility  Date of discharge: 08/09/20 Date of contact: 08/10/20 Method: Phone Spoke to: Patient  Patient contacted to discuss transition of care from recent inpatient hospitalization. Patient was admitted to [facility] from 08/07/20.to 08/09/20.[name] with discharge diagnosis of shock/hypotension  2/2 Covid infection ...  Medication changes were reviewed.  Patient will follow up with his/her outpatient HD unit on: 08/11/20

## 2020-08-10 NOTE — Patient Instructions (Signed)
Transition of care contact from inpatient facility  Date of discharge: 08/05/2020 Date of contact:  08/06/2020 Method: Phone Spoke to: Patient  Patient contacted to discuss transition of care from recent inpatient hospitalization. Patient was admitted to [facility] from 8/13 - 08/05/2020 with discharge diagnosis of DKA, uncontrolled DM, peritoneal HD failure -> converstion to HD.  Medication changes were reviewed. He does not have changes.  Patient will follow up with his/her outpatient HD unit on: TTS schedule. Went there yesterday -- he denies any issues.

## 2020-08-10 NOTE — Telephone Encounter (Signed)
Received fax from [facility] with INR results from 01/30/2020 to 08/08/2020. Last INR 2.7 on 08/08/2020. Results placed in Dr. Gladstone Pih office. Hubbard Hartshorn, BSN, RN-BC

## 2020-08-11 ENCOUNTER — Telehealth: Payer: Self-pay | Admitting: *Deleted

## 2020-08-11 ENCOUNTER — Ambulatory Visit (HOSPITAL_COMMUNITY)
Admit: 2020-08-11 | Discharge: 2020-08-11 | Disposition: A | Discharge status: Home / Self Care | Payer: Medicare Other | Source: Ambulatory Visit | Attending: Pulmonary Disease | Admitting: Pulmonary Disease

## 2020-08-11 ENCOUNTER — Telehealth: Payer: Self-pay | Admitting: Pharmacist

## 2020-08-11 DIAGNOSIS — U071 COVID-19: Secondary | ICD-10-CM | POA: Diagnosis not present

## 2020-08-11 DIAGNOSIS — N2581 Secondary hyperparathyroidism of renal origin: Secondary | ICD-10-CM | POA: Diagnosis not present

## 2020-08-11 DIAGNOSIS — J1282 Pneumonia due to coronavirus disease 2019: Secondary | ICD-10-CM | POA: Diagnosis not present

## 2020-08-11 DIAGNOSIS — N186 End stage renal disease: Secondary | ICD-10-CM | POA: Diagnosis not present

## 2020-08-11 DIAGNOSIS — E875 Hyperkalemia: Secondary | ICD-10-CM | POA: Diagnosis not present

## 2020-08-11 DIAGNOSIS — D509 Iron deficiency anemia, unspecified: Secondary | ICD-10-CM | POA: Diagnosis not present

## 2020-08-11 DIAGNOSIS — Z992 Dependence on renal dialysis: Secondary | ICD-10-CM | POA: Diagnosis not present

## 2020-08-11 MED ORDER — DIPHENHYDRAMINE HCL 50 MG/ML IJ SOLN: 50.0000 mg | Freq: Once | INTRAMUSCULAR | Status: DC | PRN

## 2020-08-11 MED ORDER — FAMOTIDINE IN NACL 20-0.9 MG/50ML-% IV SOLN: 20.0000 mg | Freq: Once | INTRAVENOUS | Status: DC | PRN

## 2020-08-11 MED ORDER — SODIUM CHLORIDE 0.9 % IV SOLN
100.0000 mg | Freq: Once | INTRAVENOUS | Status: AC
  Administered 2020-08-11: 100 mg via INTRAVENOUS
  Filled 2020-08-11: qty 20

## 2020-08-11 MED ORDER — ALBUTEROL SULFATE HFA 108 (90 BASE) MCG/ACT IN AERS: 2.0000 | INHALATION_SPRAY | Freq: Once | RESPIRATORY_TRACT | Status: DC | PRN

## 2020-08-11 MED ORDER — SODIUM CHLORIDE 0.9 % IV SOLN: INTRAVENOUS | Status: DC | PRN

## 2020-08-11 MED ORDER — EPINEPHRINE 0.3 MG/0.3ML IJ SOAJ: 0.3000 mg | Freq: Once | INTRAMUSCULAR | Status: DC | PRN

## 2020-08-11 MED ORDER — METHYLPREDNISOLONE SODIUM SUCC 125 MG IJ SOLR: 125.0000 mg | Freq: Once | INTRAMUSCULAR | Status: DC | PRN

## 2020-08-11 NOTE — Progress Notes (Signed)
  Diagnosis: COVID-19  Physician:Dr Tia Masker  Procedure: Covid Infusion Clinic Med: remdesivir infusion - Provided patient with remdesivir fact sheet for patients, parents and caregivers prior to infusion.  Complications: No immediate complications noted.  Discharge: Discharged home   Brian [name] 08/11/2020

## 2020-08-11 NOTE — Discharge Instructions (Signed)
10 Things You Can Do to Manage Your COVID-19 Symptoms at Home If you have possible or confirmed COVID-19: 1. Stay home from work and school. And stay away from other public places. If you must go out, avoid using any kind of public transportation, ridesharing, or taxis. 2. Monitor your symptoms carefully. If your symptoms get worse, call your healthcare provider immediately. 3. Get rest and stay hydrated. 4. If you have a medical appointment, call the healthcare provider ahead of time and tell them that you have or may have COVID-19. 5. For medical emergencies, call 911 and notify the dispatch personnel that you have or may have COVID-19. 6. Cover your cough and sneezes with a tissue or use the inside of your elbow. 7. Wash your hands often with soap and water for at least 20 seconds or clean your hands with an alcohol-based hand sanitizer that contains at least 60% alcohol. 8. As much as possible, stay in a specific room and away from other people in your home. Also, you should use a separate bathroom, if available. If you need to be around other people in or outside of the home, wear a mask. 9. Avoid sharing personal items with other people in your household, like dishes, towels, and bedding. 10. Clean all surfaces that are touched often, like counters, tabletops, and doorknobs. Use household cleaning sprays or wipes according to the label instructions. cdc.gov/coronavirus 06/16/2019 This information is not intended to replace advice given to you by your health care provider. Make sure you discuss any questions you have with your health care provider. Document Revised: 11/18/2019 Document Reviewed: 11/18/2019 Elsevier Patient Education  2020 Elsevier Inc.  

## 2020-08-11 NOTE — Telephone Encounter (Signed)
Post ED Visit - Positive Culture Follow-up  Culture report reviewed by antimicrobial stewardship pharmacist: [place] Team []  Elenor Quinones, Pharm.D. []  Heide Guile, Pharm.D., BCPS AQ-ID []  Parks Neptune, Pharm.D., BCPS []  Alycia Rossetti, Pharm.D., BCPS []  [place], [place].D., BCPS, AAHIVP []  Legrand Como, Pharm.D., BCPS, AAHIVP []  Salome Arnt, PharmD, BCPS []  Johnnette Gourd, PharmD, BCPS []  Hughes Better, PharmD, BCPS []  Leeroy Cha, PharmD []  Laqueta Linden, PharmD, BCPS []  Albertina Parr, PharmD  [place] Team []  Leodis Sias, PharmD []  Lindell Spar, PharmD []  Royetta Asal, PharmD []  Graylin Shiver, Rph []  Rema Fendt) Glennon Mac, PharmD []  Arlyn Dunning, PharmD []  Netta Cedars, PharmD []  Dia Sitter, PharmD []  Leone Haven, PharmD []  Gretta Arab, PharmD []  Theodis Shove, PharmD []  Peggyann Juba, PharmD []  Reuel Boom, PharmD   Positive wound culture Addressed at time of ED visit and no further patient follow-up is required at this time. Alfonse Spruce, PharmD  Harlon Flor Talley 08/11/2020, 3:51 PM

## 2020-08-11 NOTE — Telephone Encounter (Signed)
Patient performed PST FS POC INR 24-AUG-21. Receieved to [facility] 26-AUG-21. I received value on 27-AUG-21. Patient was called and advised to continue SAME regimen. Repeat PST 8-SEP-21 and provide me results. Denies any symptoms or signs of bleeding or thrombosis.

## 2020-08-12 ENCOUNTER — Ambulatory Visit (HOSPITAL_COMMUNITY)
Admit: 2020-08-12 | Discharge: 2020-08-12 | Disposition: A | Discharge status: Home / Self Care | Payer: Medicare Other | Attending: Pulmonary Disease | Admitting: Pulmonary Disease

## 2020-08-12 DIAGNOSIS — U071 COVID-19: Secondary | ICD-10-CM | POA: Insufficient documentation

## 2020-08-12 MED ORDER — SODIUM CHLORIDE 0.9 % IV SOLN: INTRAVENOUS | Status: DC | PRN

## 2020-08-12 MED ORDER — ALBUTEROL SULFATE HFA 108 (90 BASE) MCG/ACT IN AERS: 2.0000 | INHALATION_SPRAY | Freq: Once | RESPIRATORY_TRACT | Status: DC | PRN

## 2020-08-12 MED ORDER — DIPHENHYDRAMINE HCL 50 MG/ML IJ SOLN: 50.0000 mg | Freq: Once | INTRAMUSCULAR | Status: DC | PRN

## 2020-08-12 MED ORDER — METHYLPREDNISOLONE SODIUM SUCC 125 MG IJ SOLR: 125.0000 mg | Freq: Once | INTRAMUSCULAR | Status: DC | PRN

## 2020-08-12 MED ORDER — EPINEPHRINE 0.3 MG/0.3ML IJ SOAJ: 0.3000 mg | Freq: Once | INTRAMUSCULAR | Status: DC | PRN

## 2020-08-12 MED ORDER — SODIUM CHLORIDE 0.9 % IV SOLN
100.0000 mg | Freq: Once | INTRAVENOUS | Status: AC
  Administered 2020-08-12: 100 mg via INTRAVENOUS
  Filled 2020-08-12: qty 20

## 2020-08-12 MED ORDER — FAMOTIDINE IN NACL 20-0.9 MG/50ML-% IV SOLN: 20.0000 mg | Freq: Once | INTRAVENOUS | Status: DC | PRN

## 2020-08-12 NOTE — Progress Notes (Signed)
  Diagnosis: COVID-19  Physician: Patrick Wright, MD  Procedure: Covid Infusion Clinic Med: remdesivir infusion - Provided patient with remdesivir fact sheet for patients, parents and caregivers prior to infusion.  Complications: No immediate complications noted.  Discharge: Discharged home   Brian [name] N Marlet Korte 08/12/2020  

## 2020-08-12 NOTE — Discharge Instructions (Signed)
  What types of side effects do monoclonal antibody drugs cause?  Common side effects  In general, the more common side effects caused by monoclonal antibody drugs include: . Allergic reactions, such as hives or itching . Flu-like signs and symptoms, including chills, fatigue, fever, and muscle aches and pains . Nausea, vomiting . Diarrhea . Skin rashes . Low blood pressure   The CDC is recommending patients who receive monoclonal antibody treatments wait at least 90 days before being vaccinated.  Currently, there are no data on the safety and efficacy of mRNA COVID-19 vaccines in persons who received monoclonal antibodies or convalescent plasma as part of COVID-19 treatment. Based on the estimated half-life of such therapies as well as evidence suggesting that reinfection is uncommon in the 90 days after initial infection, vaccination should be deferred for at least 90 days, as a precautionary measure until additional information becomes available, to avoid interference of the antibody treatment with vaccine-induced immune responses.  What types of side effects do monoclonal antibody drugs cause?  Common side effects  In general, the more common side effects caused by monoclonal antibody drugs include: . Allergic reactions, such as hives or itching . Flu-like signs and symptoms, including chills, fatigue, fever, and muscle aches and pains . Nausea, vomiting . Diarrhea . Skin rashes . Low blood pressure   The CDC is recommending patients who receive monoclonal antibody treatments wait at least 90 days before being vaccinated.  Currently, there are no data on the safety and efficacy of mRNA COVID-19 vaccines in persons who received monoclonal antibodies or convalescent plasma as part of COVID-19 treatment. Based on the estimated half-life of such therapies as well as evidence suggesting that reinfection is uncommon in the 90 days after initial infection, vaccination should be deferred  for at least 90 days, as a precautionary measure until additional information becomes available, to avoid interference of the antibody treatment with vaccine-induced immune responses.

## 2020-08-13 LAB — CULTURE, BLOOD (ROUTINE X 2): Culture: NO GROWTH

## 2020-08-14 DIAGNOSIS — Z992 Dependence on renal dialysis: Secondary | ICD-10-CM | POA: Diagnosis not present

## 2020-08-14 DIAGNOSIS — N2581 Secondary hyperparathyroidism of renal origin: Secondary | ICD-10-CM | POA: Diagnosis not present

## 2020-08-14 DIAGNOSIS — E875 Hyperkalemia: Secondary | ICD-10-CM | POA: Diagnosis not present

## 2020-08-14 DIAGNOSIS — N186 End stage renal disease: Secondary | ICD-10-CM | POA: Diagnosis not present

## 2020-08-14 DIAGNOSIS — D509 Iron deficiency anemia, unspecified: Secondary | ICD-10-CM | POA: Diagnosis not present

## 2020-08-15 ENCOUNTER — Encounter: Payer: Self-pay | Admitting: Student

## 2020-08-15 ENCOUNTER — Other Ambulatory Visit: Payer: Self-pay

## 2020-08-15 ENCOUNTER — Ambulatory Visit (INDEPENDENT_AMBULATORY_CARE_PROVIDER_SITE_OTHER): Payer: Medicare Other | Admitting: Student

## 2020-08-15 DIAGNOSIS — U071 COVID-19: Secondary | ICD-10-CM
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
## 2020-08-15 NOTE — Assessment & Plan Note (Addendum)
Reporting significant improvement in symptoms after last infusion. Denies dyspnea even with exertion, diarrhea, weakness, cough.  Advised to continue quarantining for now. Will have him follow up in office in 1 month.

## 2020-08-16 DIAGNOSIS — I120 Hypertensive chronic kidney disease with stage 5 chronic kidney disease or end stage renal disease: Secondary | ICD-10-CM | POA: Diagnosis not present

## 2020-08-16 DIAGNOSIS — D631 Anemia in chronic kidney disease: Secondary | ICD-10-CM | POA: Diagnosis not present

## 2020-08-16 DIAGNOSIS — Z992 Dependence on renal dialysis: Secondary | ICD-10-CM | POA: Diagnosis not present

## 2020-08-16 DIAGNOSIS — U071 COVID-19: Secondary | ICD-10-CM | POA: Diagnosis not present

## 2020-08-16 DIAGNOSIS — N2581 Secondary hyperparathyroidism of renal origin: Secondary | ICD-10-CM | POA: Diagnosis not present

## 2020-08-16 DIAGNOSIS — E875 Hyperkalemia: Secondary | ICD-10-CM | POA: Diagnosis not present

## 2020-08-16 DIAGNOSIS — D509 Iron deficiency anemia, unspecified: Secondary | ICD-10-CM | POA: Diagnosis not present

## 2020-08-16 DIAGNOSIS — N186 End stage renal disease: Secondary | ICD-10-CM | POA: Diagnosis not present

## 2020-08-16 NOTE — Telephone Encounter (Signed)
Dr Elie Confer f/u

## 2020-08-18 DIAGNOSIS — E875 Hyperkalemia: Secondary | ICD-10-CM | POA: Diagnosis not present

## 2020-08-18 DIAGNOSIS — N2581 Secondary hyperparathyroidism of renal origin: Secondary | ICD-10-CM | POA: Diagnosis not present

## 2020-08-18 DIAGNOSIS — Z992 Dependence on renal dialysis: Secondary | ICD-10-CM | POA: Diagnosis not present

## 2020-08-18 DIAGNOSIS — D509 Iron deficiency anemia, unspecified: Secondary | ICD-10-CM | POA: Diagnosis not present

## 2020-08-18 DIAGNOSIS — N186 End stage renal disease: Secondary | ICD-10-CM | POA: Diagnosis not present

## 2020-08-21 DIAGNOSIS — E875 Hyperkalemia: Secondary | ICD-10-CM | POA: Diagnosis not present

## 2020-08-21 DIAGNOSIS — N2581 Secondary hyperparathyroidism of renal origin: Secondary | ICD-10-CM | POA: Diagnosis not present

## 2020-08-21 DIAGNOSIS — Z992 Dependence on renal dialysis: Secondary | ICD-10-CM | POA: Diagnosis not present

## 2020-08-21 DIAGNOSIS — D509 Iron deficiency anemia, unspecified: Secondary | ICD-10-CM | POA: Diagnosis not present

## 2020-08-21 DIAGNOSIS — N186 End stage renal disease: Secondary | ICD-10-CM | POA: Diagnosis not present

## 2020-08-21 DIAGNOSIS — U071 COVID-19: Secondary | ICD-10-CM | POA: Diagnosis not present

## 2020-08-21 NOTE — Progress Notes (Signed)
DOS 08/15/20:  Internal Medicine Clinic Attending  I participated in telehealth visit with Dr. Allyson Sabal .  I agree with the assessment, diagnosis, and plan of care documented in the resident's note.

## 2020-08-22 ENCOUNTER — Other Ambulatory Visit: Payer: Self-pay | Admitting: Student

## 2020-08-22 DIAGNOSIS — Z79891 Long term (current) use of opiate analgesic: Secondary | ICD-10-CM

## 2020-08-22 MED ORDER — OXYCODONE-ACETAMINOPHEN 10-325 MG PO TABS: 1.0000 | ORAL_TABLET | Freq: Three times a day (TID) | ORAL | 0 refills | Status: DC | PRN

## 2020-08-22 NOTE — Telephone Encounter (Signed)
Need refill on oxyCODONE-acetaminophen (PERCOCET) 10-325 MG tablet  dronabinol (MARINOL) 5 MG capsule;pt contact [phone]   [id number] - [place], [place] - [address] [place] [address]

## 2020-08-22 NOTE — Telephone Encounter (Signed)
Last rx written 07/18/20. Last OV  08/15/20 Next OV 10/19/20. UDS 02/29/16

## 2020-08-23 ENCOUNTER — Emergency Department (HOSPITAL_COMMUNITY): Payer: Medicare Other

## 2020-08-23 ENCOUNTER — Inpatient Hospital Stay (HOSPITAL_COMMUNITY)
Admission: EM | Admit: 2020-08-23 | Discharge: 2020-08-26 | DRG: 640 | Disposition: A | Discharge status: Home / Self Care | Payer: Medicare Other | Attending: Internal Medicine | Admitting: Internal Medicine

## 2020-08-23 ENCOUNTER — Other Ambulatory Visit: Payer: Self-pay

## 2020-08-23 ENCOUNTER — Telehealth: Payer: Self-pay | Admitting: *Deleted

## 2020-08-23 DIAGNOSIS — D531 Other megaloblastic anemias, not elsewhere classified: Secondary | ICD-10-CM | POA: Diagnosis present

## 2020-08-23 DIAGNOSIS — Z7901 Long term (current) use of anticoagulants: Secondary | ICD-10-CM | POA: Diagnosis not present

## 2020-08-23 DIAGNOSIS — U071 COVID-19: Secondary | ICD-10-CM | POA: Diagnosis not present

## 2020-08-23 DIAGNOSIS — M549 Dorsalgia, unspecified: Secondary | ICD-10-CM | POA: Diagnosis present

## 2020-08-23 DIAGNOSIS — G2581 Restless legs syndrome: Secondary | ICD-10-CM | POA: Diagnosis not present

## 2020-08-23 DIAGNOSIS — E875 Hyperkalemia: Secondary | ICD-10-CM

## 2020-08-23 DIAGNOSIS — R0602 Shortness of breath: Secondary | ICD-10-CM | POA: Diagnosis not present

## 2020-08-23 DIAGNOSIS — Z881 Allergy status to other antibiotic agents status: Secondary | ICD-10-CM

## 2020-08-23 DIAGNOSIS — Z905 Acquired absence of kidney: Secondary | ICD-10-CM

## 2020-08-23 DIAGNOSIS — F17210 Nicotine dependence, cigarettes, uncomplicated: Secondary | ICD-10-CM | POA: Diagnosis present

## 2020-08-23 DIAGNOSIS — Q613 Polycystic kidney, unspecified: Secondary | ICD-10-CM | POA: Diagnosis not present

## 2020-08-23 DIAGNOSIS — H8110 Benign paroxysmal vertigo, unspecified ear: Secondary | ICD-10-CM | POA: Diagnosis present

## 2020-08-23 DIAGNOSIS — G8929 Other chronic pain: Secondary | ICD-10-CM | POA: Diagnosis present

## 2020-08-23 DIAGNOSIS — K769 Liver disease, unspecified: Secondary | ICD-10-CM | POA: Diagnosis present

## 2020-08-23 DIAGNOSIS — R0600 Dyspnea, unspecified: Secondary | ICD-10-CM | POA: Diagnosis not present

## 2020-08-23 DIAGNOSIS — N186 End stage renal disease: Secondary | ICD-10-CM | POA: Diagnosis present

## 2020-08-23 DIAGNOSIS — I5022 Chronic systolic (congestive) heart failure: Secondary | ICD-10-CM | POA: Diagnosis present

## 2020-08-23 DIAGNOSIS — Z83438 Family history of other disorder of lipoprotein metabolism and other lipidemia: Secondary | ICD-10-CM

## 2020-08-23 DIAGNOSIS — Z833 Family history of diabetes mellitus: Secondary | ICD-10-CM

## 2020-08-23 DIAGNOSIS — I712 Thoracic aortic aneurysm, without rupture: Secondary | ICD-10-CM | POA: Diagnosis present

## 2020-08-23 DIAGNOSIS — K219 Gastro-esophageal reflux disease without esophagitis: Secondary | ICD-10-CM | POA: Diagnosis present

## 2020-08-23 DIAGNOSIS — I132 Hypertensive heart and chronic kidney disease with heart failure and with stage 5 chronic kidney disease, or end stage renal disease: Secondary | ICD-10-CM | POA: Diagnosis present

## 2020-08-23 DIAGNOSIS — Z9115 Patient's noncompliance with renal dialysis: Secondary | ICD-10-CM | POA: Diagnosis not present

## 2020-08-23 DIAGNOSIS — Z21 Asymptomatic human immunodeficiency virus [HIV] infection status: Secondary | ICD-10-CM | POA: Diagnosis present

## 2020-08-23 DIAGNOSIS — I120 Hypertensive chronic kidney disease with stage 5 chronic kidney disease or end stage renal disease: Secondary | ICD-10-CM | POA: Diagnosis not present

## 2020-08-23 DIAGNOSIS — Z79899 Other long term (current) drug therapy: Secondary | ICD-10-CM

## 2020-08-23 DIAGNOSIS — Z8249 Family history of ischemic heart disease and other diseases of the circulatory system: Secondary | ICD-10-CM

## 2020-08-23 DIAGNOSIS — R05 Cough: Secondary | ICD-10-CM | POA: Diagnosis not present

## 2020-08-23 DIAGNOSIS — D631 Anemia in chronic kidney disease: Secondary | ICD-10-CM | POA: Diagnosis present

## 2020-08-23 DIAGNOSIS — I5020 Unspecified systolic (congestive) heart failure: Secondary | ICD-10-CM | POA: Diagnosis not present

## 2020-08-23 DIAGNOSIS — J1282 Pneumonia due to coronavirus disease 2019: Secondary | ICD-10-CM | POA: Diagnosis not present

## 2020-08-23 DIAGNOSIS — Z20822 Contact with and (suspected) exposure to covid-19: Secondary | ICD-10-CM | POA: Diagnosis not present

## 2020-08-23 DIAGNOSIS — R531 Weakness: Secondary | ICD-10-CM | POA: Diagnosis not present

## 2020-08-23 DIAGNOSIS — Z992 Dependence on renal dialysis: Secondary | ICD-10-CM

## 2020-08-23 DIAGNOSIS — J449 Chronic obstructive pulmonary disease, unspecified: Secondary | ICD-10-CM | POA: Diagnosis present

## 2020-08-23 DIAGNOSIS — Z86711 Personal history of pulmonary embolism: Secondary | ICD-10-CM

## 2020-08-23 DIAGNOSIS — R042 Hemoptysis: Secondary | ICD-10-CM | POA: Diagnosis present

## 2020-08-23 DIAGNOSIS — I428 Other cardiomyopathies: Secondary | ICD-10-CM | POA: Diagnosis present

## 2020-08-23 DIAGNOSIS — Z888 Allergy status to other drugs, medicaments and biological substances status: Secondary | ICD-10-CM

## 2020-08-23 DIAGNOSIS — Z8616 Personal history of COVID-19: Secondary | ICD-10-CM

## 2020-08-23 DIAGNOSIS — R519 Headache, unspecified: Secondary | ICD-10-CM | POA: Diagnosis not present

## 2020-08-23 DIAGNOSIS — R42 Dizziness and giddiness: Secondary | ICD-10-CM | POA: Diagnosis not present

## 2020-08-23 HISTORY — DX: Hyperkalemia: E87.5

## 2020-08-23 LAB — COMPREHENSIVE METABOLIC PANEL
ALT: 13 U/L (ref 0–44)
AST: 21 U/L (ref 15–41)
Albumin: 3.3 g/dL — ABNORMAL LOW (ref 3.5–5.0)
Alkaline Phosphatase: 55 U/L (ref 38–126)
Anion gap: 17 — ABNORMAL HIGH (ref 5–15)
BUN: 68 mg/dL — ABNORMAL HIGH (ref 6–20)
CO2: 25 mmol/L (ref 22–32)
Calcium: 9.8 mg/dL (ref 8.9–10.3)
Chloride: 86 mmol/L — ABNORMAL LOW (ref 98–111)
Creatinine, Ser: 8.09 mg/dL — ABNORMAL HIGH (ref 0.61–1.24)
GFR calc Af Amer: 8 mL/min — ABNORMAL LOW (ref >60)
GFR calc non Af Amer: 7 mL/min — ABNORMAL LOW (ref >60)
Glucose, Bld: 69 mg/dL — ABNORMAL LOW (ref 70–99)
Potassium: >7.5 mmol/L (ref 3.5–5.1)
Sodium: 128 mmol/L — ABNORMAL LOW (ref 135–145)
Total Bilirubin: 1.6 mg/dL — ABNORMAL HIGH (ref 0.3–1.2)
Total Protein: 7.3 g/dL (ref 6.5–8.1)

## 2020-08-23 LAB — POC SARS CORONAVIRUS 2 AG -  ED: SARS Coronavirus 2 Ag: NEGATIVE

## 2020-08-23 LAB — CBG MONITORING, ED
Glucose-Capillary: 44 mg/dL — CL (ref 70–99)
Glucose-Capillary: 80 mg/dL (ref 70–99)

## 2020-08-23 LAB — CBC
HCT: 37.4 % — ABNORMAL LOW (ref 39.0–52.0)
Hemoglobin: 12.2 g/dL — ABNORMAL LOW (ref 13.0–17.0)
MCH: 35 pg — ABNORMAL HIGH (ref 26.0–34.0)
MCHC: 32.6 g/dL (ref 30.0–36.0)
MCV: 107.2 fL — ABNORMAL HIGH (ref 80.0–100.0)
Platelets: 149 10*3/uL — ABNORMAL LOW (ref 150–400)
RBC: 3.49 MIL/uL — ABNORMAL LOW (ref 4.22–5.81)
RDW: 17.2 % — ABNORMAL HIGH (ref 11.5–15.5)
WBC: 6.6 10*3/uL (ref 4.0–10.5)
nRBC: 0 % (ref 0.0–0.2)

## 2020-08-23 LAB — LIPASE, BLOOD: Lipase: 32 U/L (ref 11–51)

## 2020-08-23 MED ORDER — ALBUTEROL SULFATE HFA 108 (90 BASE) MCG/ACT IN AERS
2.0000 | INHALATION_SPRAY | Freq: Once | RESPIRATORY_TRACT | Status: AC
  Administered 2020-08-23: 2 via RESPIRATORY_TRACT
  Filled 2020-08-23: qty 6.7

## 2020-08-23 MED ORDER — CHLORHEXIDINE GLUCONATE CLOTH 2 % EX PADS: 6.0000 | MEDICATED_PAD | Freq: Every day | CUTANEOUS | Status: DC

## 2020-08-23 MED ORDER — DEXTROSE 50 % IV SOLN
1.0000 | Freq: Once | INTRAVENOUS | Status: AC
  Administered 2020-08-23: 50 mL via INTRAVENOUS
  Filled 2020-08-23: qty 50

## 2020-08-23 MED ORDER — CALCITRIOL 0.25 MCG PO CAPS: 2.0000 ug | ORAL_CAPSULE | ORAL | Status: DC

## 2020-08-23 MED ORDER — INSULIN ASPART 100 UNIT/ML IV SOLN
5.0000 [IU] | Freq: Once | INTRAVENOUS | Status: AC
  Administered 2020-08-23: 5 [IU] via INTRAVENOUS

## 2020-08-23 MED ORDER — SODIUM BICARBONATE 8.4 % IV SOLN
INTRAVENOUS | Status: AC
  Filled 2020-08-23: qty 100

## 2020-08-23 MED ORDER — FERRIC CITRATE 1 GM 210 MG(FE) PO TABS
420.0000 mg | ORAL_TABLET | Freq: Three times a day (TID) | ORAL | Status: DC
  Administered 2020-08-24 – 2020-08-26 (×5): 420 mg via ORAL
  Filled 2020-08-23 (×7): qty 2

## 2020-08-23 MED ORDER — OXYCODONE HCL 5 MG PO TABS
5.0000 mg | ORAL_TABLET | Freq: Once | ORAL | Status: AC
  Administered 2020-08-23: 5 mg via ORAL
  Filled 2020-08-23: qty 1

## 2020-08-23 MED ORDER — SODIUM CHLORIDE 0.9 % IV SOLN
1.0000 g | INTRAVENOUS | Status: AC
  Administered 2020-08-23: 1 g via INTRAVENOUS
  Filled 2020-08-23: qty 10

## 2020-08-23 MED ORDER — OXYCODONE-ACETAMINOPHEN 5-325 MG PO TABS
1.0000 | ORAL_TABLET | Freq: Once | ORAL | Status: AC
  Administered 2020-08-23: 1 via ORAL
  Filled 2020-08-23: qty 1

## 2020-08-23 NOTE — ED Provider Notes (Signed)
  Face-to-face evaluation   History: He presents for evaluation of shortness of breath, abdominal pain, and diarrhea.  He did not go to dialysis today because he was feeling poorly.   Physical exam: Alert, calm cooperative.  No respiratory distress.  Abdomen soft mildly tender diffusely.  Fistula left upper arm has normal pulse and thrill.  Patient is lucid  Medical screening examination/treatment/procedure(s) were conducted as a shared visit with non-physician practitioner(s) and myself.  I personally evaluated the patient during the encounter    Daleen Bo, MD 08/26/20 [phone]
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
## 2020-08-23 NOTE — Telephone Encounter (Signed)
I agree with ED evaluation ASAP

## 2020-08-23 NOTE — Telephone Encounter (Addendum)
Return pt's call -"Feeling awful". States he was feeling great last week until Friday. Coughing up blood again. Very weak, difficulty breathing, no energy. He was in the hospital for Covid recently. Scheduled for dialysis-unable to go. He's home alone, unable to drive. Instructed pt to call EMS/911 - stated he will.

## 2020-08-23 NOTE — Telephone Encounter (Signed)
Patient states MD told him to call in if he started to feel bad again after hospitalization.  Please call states he is feeling worse.

## 2020-08-23 NOTE — Telephone Encounter (Signed)
F/U call - stated he has not called EMS yet; he was lying down to see if he will feel better. Informed go to ED ASAP per the attending. Stated he will; it will take him 30 mins to get dress.

## 2020-08-23 NOTE — ED Triage Notes (Signed)
Pt covid 8/23, since Friday he is coughing up bright red blood with his phlegm. Pt endorses dizziness, shortness of breath with exertion, headache, and abdominal pain.

## 2020-08-24 LAB — CBG MONITORING, ED
Glucose-Capillary: 101 mg/dL — ABNORMAL HIGH (ref 70–99)
Glucose-Capillary: 82 mg/dL (ref 70–99)

## 2020-08-24 LAB — PROTIME-INR
INR: 2.4 — ABNORMAL HIGH (ref 0.8–1.2)
Prothrombin Time: 25.1 seconds — ABNORMAL HIGH (ref 11.4–15.2)

## 2020-08-24 LAB — CBC
HCT: 35.4 % — ABNORMAL LOW (ref 39.0–52.0)
Hemoglobin: 11.6 g/dL — ABNORMAL LOW (ref 13.0–17.0)
MCH: 34.6 pg — ABNORMAL HIGH (ref 26.0–34.0)
MCHC: 32.8 g/dL (ref 30.0–36.0)
MCV: 105.7 fL — ABNORMAL HIGH (ref 80.0–100.0)
Platelets: 126 10*3/uL — ABNORMAL LOW (ref 150–400)
RBC: 3.35 MIL/uL — ABNORMAL LOW (ref 4.22–5.81)
RDW: 17.2 % — ABNORMAL HIGH (ref 11.5–15.5)
WBC: 3.3 10*3/uL — ABNORMAL LOW (ref 4.0–10.5)
nRBC: 0 % (ref 0.0–0.2)

## 2020-08-24 LAB — PROCALCITONIN: Procalcitonin: 0.35 ng/mL

## 2020-08-24 LAB — RENAL FUNCTION PANEL
Albumin: 3.1 g/dL — ABNORMAL LOW (ref 3.5–5.0)
Anion gap: 17 — ABNORMAL HIGH (ref 5–15)
BUN: 34 mg/dL — ABNORMAL HIGH (ref 6–20)
CO2: 25 mmol/L (ref 22–32)
Calcium: 9.4 mg/dL (ref 8.9–10.3)
Chloride: 93 mmol/L — ABNORMAL LOW (ref 98–111)
Creatinine, Ser: 5.06 mg/dL — ABNORMAL HIGH (ref 0.61–1.24)
GFR calc Af Amer: 15 mL/min — ABNORMAL LOW (ref >60)
GFR calc non Af Amer: 13 mL/min — ABNORMAL LOW (ref >60)
Glucose, Bld: 82 mg/dL (ref 70–99)
Phosphorus: 6.1 mg/dL — ABNORMAL HIGH (ref 2.5–4.6)
Potassium: 5.5 mmol/L — ABNORMAL HIGH (ref 3.5–5.1)
Sodium: 135 mmol/L (ref 135–145)

## 2020-08-24 LAB — GLUCOSE, CAPILLARY
Glucose-Capillary: 77 mg/dL (ref 70–99)
Glucose-Capillary: 91 mg/dL (ref 70–99)
Glucose-Capillary: 99 mg/dL (ref 70–99)

## 2020-08-24 LAB — BASIC METABOLIC PANEL
Anion gap: 14 (ref 5–15)
BUN: 39 mg/dL — ABNORMAL HIGH (ref 6–20)
CO2: 26 mmol/L (ref 22–32)
Calcium: 9.5 mg/dL (ref 8.9–10.3)
Chloride: 94 mmol/L — ABNORMAL LOW (ref 98–111)
Creatinine, Ser: 5.72 mg/dL — ABNORMAL HIGH (ref 0.61–1.24)
GFR calc Af Amer: 13 mL/min — ABNORMAL LOW (ref >60)
GFR calc non Af Amer: 11 mL/min — ABNORMAL LOW (ref >60)
Glucose, Bld: 96 mg/dL (ref 70–99)
Potassium: 6.2 mmol/L — ABNORMAL HIGH (ref 3.5–5.1)
Sodium: 134 mmol/L — ABNORMAL LOW (ref 135–145)

## 2020-08-24 LAB — VITAMIN B12: Vitamin B-12: 365 pg/mL (ref 180–914)

## 2020-08-24 LAB — FOLATE: Folate: 6.9 ng/mL (ref >5.9)

## 2020-08-24 LAB — HEPATITIS B SURFACE ANTIGEN: Hepatitis B Surface Ag: NONREACTIVE

## 2020-08-24 MED ORDER — HEPARIN SODIUM (PORCINE) 1000 UNIT/ML DIALYSIS: 5000.0000 [IU] | Freq: Once | INTRAMUSCULAR | Status: DC

## 2020-08-24 MED ORDER — OXYCODONE-ACETAMINOPHEN 10-325 MG PO TABS: 1.0000 | ORAL_TABLET | Freq: Three times a day (TID) | ORAL | Status: DC | PRN

## 2020-08-24 MED ORDER — WARFARIN - PHARMACIST DOSING INPATIENT: Freq: Every day | Status: DC

## 2020-08-24 MED ORDER — SODIUM CHLORIDE 0.9 % IV SOLN
2.0000 g | Freq: Every day | INTRAVENOUS | Status: DC
  Administered 2020-08-24 – 2020-08-25 (×3): 2 g via INTRAVENOUS
  Filled 2020-08-24: qty 2
  Filled 2020-08-24 (×3): qty 20

## 2020-08-24 MED ORDER — ALTEPLASE 2 MG IJ SOLR
2.0000 mg | Freq: Once | INTRAMUSCULAR | Status: DC | PRN
  Filled 2020-08-24: qty 2

## 2020-08-24 MED ORDER — DEXTROSE 5 % IV SOLN: 250.0000 mg | INTRAVENOUS | Status: DC

## 2020-08-24 MED ORDER — WARFARIN SODIUM 7.5 MG PO TABS
7.5000 mg | ORAL_TABLET | Freq: Once | ORAL | Status: AC
  Administered 2020-08-24: 7.5 mg via ORAL
  Filled 2020-08-24: qty 1

## 2020-08-24 MED ORDER — DRONABINOL 2.5 MG PO CAPS
5.0000 mg | ORAL_CAPSULE | Freq: Once | ORAL | Status: AC
  Administered 2020-08-24: 5 mg via ORAL

## 2020-08-24 MED ORDER — FLUTICASONE PROPIONATE 50 MCG/ACT NA SUSP
2.0000 | Freq: Every day | NASAL | Status: DC
  Administered 2020-08-24 – 2020-08-26 (×3): 2 via NASAL
  Filled 2020-08-24: qty 16

## 2020-08-24 MED ORDER — HEPARIN SODIUM (PORCINE) 1000 UNIT/ML DIALYSIS
5000.0000 [IU] | INTRAMUSCULAR | Status: DC | PRN
  Administered 2020-08-24: 5000 [IU] via INTRAVENOUS_CENTRAL

## 2020-08-24 MED ORDER — ROPINIROLE HCL 1 MG PO TABS
2.0000 mg | ORAL_TABLET | Freq: Every day | ORAL | Status: DC
  Administered 2020-08-24 – 2020-08-25 (×2): 2 mg via ORAL
  Filled 2020-08-24 (×3): qty 2

## 2020-08-24 MED ORDER — ACETAMINOPHEN 325 MG PO TABS: 650.0000 mg | ORAL_TABLET | Freq: Four times a day (QID) | ORAL | Status: DC | PRN

## 2020-08-24 MED ORDER — DRONABINOL 2.5 MG PO CAPS
5.0000 mg | ORAL_CAPSULE | Freq: Every day | ORAL | Status: DC
  Administered 2020-08-24 – 2020-08-26 (×3): 5 mg via ORAL
  Filled 2020-08-24 (×4): qty 2

## 2020-08-24 MED ORDER — OXYCODONE HCL 5 MG PO TABS
5.0000 mg | ORAL_TABLET | Freq: Three times a day (TID) | ORAL | Status: DC | PRN
  Administered 2020-08-24: 5 mg via ORAL
  Filled 2020-08-24: qty 1

## 2020-08-24 MED ORDER — EMTRICITABINE-TENOFOVIR AF 200-25 MG PO TABS
1.0000 | ORAL_TABLET | Freq: Every day | ORAL | Status: DC
  Administered 2020-08-24 – 2020-08-26 (×3): 1 via ORAL
  Filled 2020-08-24 (×4): qty 1

## 2020-08-24 MED ORDER — OXYCODONE-ACETAMINOPHEN 5-325 MG PO TABS
1.0000 | ORAL_TABLET | Freq: Three times a day (TID) | ORAL | Status: DC | PRN
  Administered 2020-08-24: 1 via ORAL
  Filled 2020-08-24: qty 1

## 2020-08-24 MED ORDER — OXYCODONE HCL 5 MG PO TABS
10.0000 mg | ORAL_TABLET | Freq: Three times a day (TID) | ORAL | Status: DC | PRN
  Administered 2020-08-24 – 2020-08-26 (×6): 10 mg via ORAL
  Filled 2020-08-24 (×6): qty 2

## 2020-08-24 MED ORDER — LIDOCAINE HCL (PF) 1 % IJ SOLN: 5.0000 mL | INTRAMUSCULAR | Status: DC | PRN

## 2020-08-24 MED ORDER — SODIUM CHLORIDE 0.9 % IV SOLN: 100.0000 mL | INTRAVENOUS | Status: DC | PRN

## 2020-08-24 MED ORDER — SODIUM CHLORIDE 0.9 % IV SOLN: 500.0000 mg | Freq: Once | INTRAVENOUS | Status: DC

## 2020-08-24 MED ORDER — DOLUTEGRAVIR SODIUM 50 MG PO TABS
50.0000 mg | ORAL_TABLET | Freq: Every day | ORAL | Status: DC
  Administered 2020-08-24 – 2020-08-26 (×3): 50 mg via ORAL
  Filled 2020-08-24 (×4): qty 1

## 2020-08-24 MED ORDER — POLYETHYLENE GLYCOL 3350 17 G PO PACK: 17.0000 g | PACK | Freq: Every day | ORAL | Status: DC | PRN

## 2020-08-24 MED ORDER — ACETAMINOPHEN 325 MG PO TABS: 325.0000 mg | ORAL_TABLET | Freq: Three times a day (TID) | ORAL | Status: DC | PRN

## 2020-08-24 MED ORDER — ACETAMINOPHEN 650 MG RE SUPP: 650.0000 mg | Freq: Four times a day (QID) | RECTAL | Status: DC | PRN

## 2020-08-24 MED ORDER — LIDOCAINE-PRILOCAINE 2.5-2.5 % EX CREA: 1.0000 "application " | TOPICAL_CREAM | CUTANEOUS | Status: DC | PRN

## 2020-08-24 MED ORDER — HEPARIN SODIUM (PORCINE) 1000 UNIT/ML DIALYSIS: 1000.0000 [IU] | INTRAMUSCULAR | Status: DC | PRN

## 2020-08-24 MED ORDER — PENTAFLUOROPROP-TETRAFLUOROETH EX AERO: 1.0000 "application " | INHALATION_SPRAY | CUTANEOUS | Status: DC | PRN
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
## 2020-08-24 NOTE — Evaluation (Signed)
Physical Therapy One-time Evaluation Patient Details Name: Brian [name] MRN: [id number] DOB: Jul 05, 1976 Today's Date: 08/24/2020   History of Present Illness  44 year old male with PMHx of end stage renal disease on HD MWF, abdominal aortic aneurysm (stable at 4cm), polycystic kidney disease, anemia of chronic disease, chronic bronchitis, COPD (not on home oxygen), HIV (undetectable viral load), COVID pneumonia on 8/24 with hypovolemic shock in setting of GI losse,s PE on warfarin, alcohol abuse, RLS presenting to ED on 9/8 with generalized fatigue, dyspnea on exertion, and hemoptysis for the past 6 days. CXR consistent with bilateral COVID PNA.  Clinical Impression   Pt presents with [facility] strength, ROM, balance, and mobility on PT evaluation. Pt is mod I to supervision for all mobility at this time, states he feels at baseline level of functioning. Pt's only deficit noted with mild unsteadiness with head turns during gait, secondary to sinus involvement. PT encouraged pt to take his time at home and not make sudden head turns if possible. Additionally, PT encouraged pt to be up and moving throughout the day while acute and once home, given bilateral PNA. SpO2 95% pre and immediately post-ambulation on RA. Pt with no follow up needs, will sign off. Please reconsult as needed.     Follow Up Recommendations No PT follow up    Equipment Recommendations  None recommended by PT    Recommendations for Other Services       Precautions / Restrictions Precautions Precautions: Other (comment) Precaution Comments: dizziness secondary to sinus pressure, head turning most problematic Restrictions Weight Bearing Restrictions: No      Mobility  Bed Mobility Overal bed mobility: Independent                Transfers Overall transfer level: Modified independent               General transfer comment: increased time to rise, no physical assist  Ambulation/Gait Ambulation/Gait  assistance: Supervision Gait Distance (Feet): 350 Feet Assistive device: None Gait Pattern/deviations: Step-through pattern;WFL(Within Functional Limits) Gait velocity: WFL   General Gait Details: WFL speed and steadiness during gait, mild imbalance with head turns but pt-corrected  Stairs Stairs:  (not attempted - pt appears to be baseline level of functioning so deferred)          Wheelchair Mobility    Modified Rankin (Stroke Patients Only)       Balance Overall balance assessment: Modified Independent                               Standardized Balance Assessment Standardized Balance Assessment : Dynamic Gait Index   Dynamic Gait Index Level Surface: Normal Change in Gait Speed: Normal Gait with Horizontal Head Turns: Moderate Impairment Gait with Vertical Head Turns: Moderate Impairment Gait and Pivot Turn: Normal Step Over Obstacle: Normal Step Around Obstacles: Normal Steps: Normal (inferred) Total Score: 20       Pertinent Vitals/Pain Pain Assessment: 0-10 Pain Score: 7  Pain Location: abdomen Pain Descriptors / Indicators: Sore;Aching Pain Intervention(s): Limited activity within patient's tolerance;Monitored during session;Repositioned    Home Living Family/patient expects to be discharged to:: Private residence Living Arrangements: Other (Comment) (lives with ex-girlfriend who won't help him out; states "we are just riding out our lease")   Type of Home: Apartment Home Access: Stairs to enter   [employer] of Steps: 6 Home Layout: One level Home Equipment: None  Prior Function Level of Independence: Independent         Comments: pt drives a motorcycle, takes self to dialysis     Hand Dominance   Dominant Hand: Right    Extremity/Trunk Assessment   Upper Extremity Assessment Upper Extremity Assessment: Overall WFL for tasks assessed    Lower Extremity Assessment Lower Extremity Assessment: Overall  WFL for tasks assessed    Cervical / Trunk Assessment Cervical / Trunk Assessment: Normal  Communication   Communication: No difficulties  Cognition Arousal/Alertness: Awake/alert Behavior During Therapy: WFL for tasks assessed/performed Overall Cognitive Status: Within Functional Limits for tasks assessed                                        General Comments      Exercises Other Exercises Other Exercises: PT encouraged pt up in chair during the day while acute and at home, multiple walking bouts a day at home for pulmonary health   Assessment/Plan    PT Assessment Patent does not need any further PT services  PT Problem List         PT Treatment Interventions      PT Goals (Current goals can be found in the Care Plan section)  Acute Rehab PT Goals Patient Stated Goal: go home PT Goal Formulation: With patient Time For Goal Achievement: 08/24/20 Potential to Achieve Goals: Good    Frequency     Barriers to discharge        Co-evaluation               AM-PAC PT "6 Clicks" Mobility  Outcome Measure Help needed turning from your back to your side while in a flat bed without using bedrails?: None Help needed moving from lying on your back to sitting on the side of a flat bed without using bedrails?: None Help needed moving to and from a bed to a chair (including a wheelchair)?: None Help needed standing up from a chair using your arms (e.g., wheelchair or bedside chair)?: None Help needed to walk in hospital room?: None Help needed climbing 3-5 steps with a railing? : None 6 Click Score: 24    End of Session   Activity Tolerance: Patient tolerated treatment well Patient left: in chair;with call bell/phone within reach Nurse Communication: Mobility status PT Visit Diagnosis: Other abnormalities of gait and mobility (R26.89)    Time: 3016-0109 PT Time Calculation (min) (ACUTE ONLY): 15 min   Charges:   PT Evaluation $PT Eval Low  Complexity: 1 Low        Regina Coppolino E, PT Acute Rehabilitation Services Pager [phone]  Office [phone]    Karsin Pesta D Nesbit Michon 08/24/2020, 9:55 AM

## 2020-08-24 NOTE — Evaluation (Signed)
Occupational Therapy Evaluation & Discharge Patient Details Name: Brian [name] MRN: [id number] DOB: 09-May-1976 Today's Date: 08/24/2020    History of Present Illness 44 year old male with PMHx of end stage renal disease on HD MWF, abdominal aortic aneurysm (stable at 4cm), polycystic kidney disease, anemia of chronic disease, chronic bronchitis, COPD (not on home oxygen), HIV (undetectable viral load), COVID pneumonia on 8/24 with hypovolemic shock in setting of GI losse,s PE on warfarin, alcohol abuse, RLS presenting to ED on 9/8 with generalized fatigue, dyspnea on exertion, and hemoptysis for the past 6 days. CXR consistent with bilateral COVID PNA.   Clinical Impression   PTA, pt Independent with all ADLs, IADLs and mobility. Pt transports self to dialysis and enjoys riding motorcycle. Pt presents now feeling close to baseline. Pt overall Modified Independent to Independent for ADLs and mobility without AD. Pt with mild unsteadiness initially on standing that improved with further activity. SpO2 >89% on RA, sustained low 90s with RR and HR WFL. Provided safety education to pt, recommendations for activity at home for pulmonary health and energy conservation (handout provided) to maximize independence with daily tasks at home. No further skilled OT services indicated. OT to sign off. Please re-consult if needed.     Follow Up Recommendations  No OT follow up    Equipment Recommendations  None recommended by OT    Recommendations for Other Services       Precautions / Restrictions Precautions Precautions: Other (comment) Precaution Comments: dizziness secondary to sinus pressure, head turning most problematic Restrictions Weight Bearing Restrictions: No      Mobility Bed Mobility               General bed mobility comments: up in chair on entry  Transfers Overall transfer level: Modified independent Equipment used: None             General transfer comment: no  physical assist needed. Mild unsteadiness initially but improved. Continues to endore mild dizziness when turning head when walking     Balance Overall balance assessment: Mild deficits observed, not formally tested                                         ADL either performed or assessed with clinical judgement   ADL Overall ADL's : Modified independent;At baseline                                       General ADL Comments: Pt overall Modified Independent to Independent for ADLs and mobility without AD. Pt able to don socks without difficulty, mobilize in room and hallway. Minor unsteadiness initially that improved with distance.      Vision Baseline Vision/History: No visual deficits Patient Visual Report: No change from baseline Vision Assessment?: No apparent visual deficits     Perception     Praxis      Pertinent Vitals/Pain Pain Assessment: No/denies pain Pain Intervention(s): Monitored during session     Hand Dominance Right   Extremity/Trunk Assessment Upper Extremity Assessment Upper Extremity Assessment: Overall WFL for tasks assessed   Lower Extremity Assessment Lower Extremity Assessment: Defer to PT evaluation   Cervical / Trunk Assessment Cervical / Trunk Assessment: Normal   Communication Communication Communication: No difficulties   Cognition Arousal/Alertness: Awake/alert Behavior During Therapy: [facility]  for tasks assessed/performed Overall Cognitive Status: Within Functional Limits for tasks assessed                                     General Comments  SpO2 >89%, sustained low 90s during activity on RA. Educated on energy conservation strategies to implement during ADLs/IADLs at home especially on dialysis days with pt active participant in discussion    Exercises     Shoulder Instructions      Home Living Family/patient expects to be discharged to:: Private residence Living Arrangements: Other  (Comment) (ex-girlfriend)   Type of Home: Apartment Home Access: Stairs to enter Entrance Stairs-Number of Steps: 6   Home Layout: One level     Bathroom Shower/Tub: Teacher, early years/pre: [employer]          Prior Functioning/Environment Level of Independence: Independent        Comments: Pt reports Independence with ADLs, IADLs and mobility. Drives motorcycle, drives self to dialysis. Reports using a cane occasionally if knee is bothering him        OT Problem List:        OT Treatment/Interventions:      OT Goals(Current goals can be found in the care plan section) Acute Rehab OT Goals Patient Stated Goal: go home OT Goal Formulation: All assessment and education complete, DC therapy  OT Frequency:     Barriers to D/C:            Co-evaluation              AM-PAC OT "6 Clicks" Daily Activity     Outcome Measure Help from another person eating meals?: None Help from another person taking care of personal grooming?: None Help from another person toileting, which includes using toliet, bedpan, or urinal?: None Help from another person bathing (including washing, rinsing, drying)?: None Help from another person to put on and taking off regular upper body clothing?: None Help from another person to put on and taking off regular lower body clothing?: None 6 Click Score: 24   End of Session    Activity Tolerance: Patient tolerated treatment well Patient left: in chair;with call bell/phone within reach                   Time: 1341-1406 OT Time Calculation (min): 25 min Charges:  OT General Charges $OT Visit: 1 Visit OT Evaluation $OT Eval Low Complexity: 1 Low OT Treatments $Therapeutic Activity: 8-22 mins  Layla Maw, OTR/L  Layla Maw 08/24/2020, 2:16 PM

## 2020-08-24 NOTE — ED Notes (Signed)
Attempted blood draw x2. Unable to obtain. Will call lab.

## 2020-08-24 NOTE — Progress Notes (Signed)
Subjective:  No diarrheal episodes. Patient also reports that he has had less hemoptysis and reports that it occurs more after laying down.  Objective:  Vital signs in last 24 hours: Vitals:   08/24/20 0530 08/24/20 0600 08/24/20 0607 08/24/20 0738  BP: 108/67 (!) 99/55 (!) 108/58 119/75  Pulse: (!) 108 (!) 107 (!) 106 (!) 108  Resp: (!) 21 (!) 21 16 14   Temp:   (!) 97.5 F (36.4 C) 97.8 F (36.6 C)  TempSrc:   Oral Oral  SpO2:   94% 100%  Weight:   87.1 kg   Height:       Physical Exam Constitutional:      Appearance: Normal appearance.  Eyes:     General: No visual field deficit.    Extraocular Movements: Extraocular movements intact.     Pupils: Pupils are equal, round, and reactive to light.  Cardiovascular:     Rate and Rhythm: Normal rate and regular rhythm.  Pulmonary:     Effort: Pulmonary effort is normal.     Comments: Left lower lobe rhonchi Breathing well no RA Lymphadenopathy:     Cervical: No cervical adenopathy.     Comments: Left neck tender to palpation along trachea  Neurological:     General: No focal deficit present.     Mental Status: He is alert and oriented to person, place, and time.     Cranial Nerves: No dysarthria or facial asymmetry.     Comments: No nystagmus,       Assessment/Plan:  Active Problems:   Hyperkalemia  Brian [name] is a 44 year old male with PMHx of end stage renal disease on hemodialysis Mon, Wed, Friday, abdominal aortic aneurysm (stable at 4cm), polycystic kidney disease, anemia of chronic disease, chronic bronchitis, COPD (not on home oxygen), HIV (undetectable viral load), COVID pneumonia on 8/24 with hypovolemic shock in setting of GI losses PE on warfarin presenting with generalized fatigue, dyspnea on exertion, and hemoptysis for the past 6 days along with one day of diarrhea, maxillary sinus tenderness, and abdominal tenderness.  #Weakness/Dyspnea Patient reports decreased SOB. Patient also reports  decreased hemoptysis and notes that he is having more hemoptysis after lying down. Patient CXR is consistent with  WBC is 3.3 down from 6.6 on admission but closer to patient baseline around 3-5. Procal 0.35. Patient has a history of PE and is on warfarin. PT/INR 25.1/2.4.  - Respiratory panel pending  Management to be adjusted based on results - Low suspicion for PCP in light of patient's HIV, his viral load is stable - 2 g rocephin IV ordered 3 days. Azithromycin not given as patient has current QT prolongation   #ESRD with on Hemodialysis Received emergent HD evening of admission. - Will repeat renal function panel in the morning - Daily weights to be monitored - nephro consulted, recommendation appreciated  #Diarrhea- resolved Patient reports no diarrheal episodes today. - IV fluids PRN per nephro - Will continue to monitor for increasing amounts of diarrhea and fluid status  - cancel C. Diff    #Maxillary sinus and ear pain Patient complains of dizzy feeling when he moves his head especially when turning his head to the right. Aleve did help the pain at home. Patient had no nystagmus on exam. Patient also noted some dizziness while walking. He has a hx of vertigo but said that this feels a bit different.. He has been seen in the past at [facility] by Dr.  Redmond Baseman of otolaryngology that diagnosed patient with benign paroxysmal positional vertigo.  -Will recommend patient follow up with Dr. Redmond Baseman as in his prior note from 06/15/20 he states " patient to call back if vertigo continues and will proceed with dix-hallpike testing." - Vertigo not debilitating at this time and do not believe patient is candidate for medical therapy  - Continue to monitor for worsening sinus and ear pain, consider antibiotics if symptoms worsen for possible sinusitis - If no improvement, consider CT head w/o contrast and/or maxillary - Continue with supportive treatment - Patient with  history of rhinitis/allergies, flonase 50 mcg nasal spray, 2 sprays into each nare daily  #Headache Patient notes centralized headache for the past 5 days that does not radiate. He denies lacrimation, changes in vision. Denies any trauma or hitting his head. Suspect etiology of migraine vs dehydration vs sinusitis - Will continue to monitor worsening of headache - Continue acetaminophen 650 mg for mild pain  #Anemia Hemoglobin of 12.2>11.6, prior 11.5/11.0. MCV of 107.2. Suspected etiology is anemia due to ESRD with single kidney vs b12 or folate deficiency. Nephrology consulted and do not believe patient requires ESA at this time.  Folate 6.9, Vit B12 365. 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 - Continue daily CBC's - If patient's hgb drops, will consider ESA  #QT Prolongation EKG revealed QT prolongation when compared to prior. Patient denies any chest pain, palpitations, lightheadedness.  - EKG tom - discontinue continuous tele - Avoid QT prolonging medications  #Hx of HIV Patient with history of HIV, followed by infectious disease. He notes a stable viral load on descovy and tivicay. Last appointment with ID on 12/21/2019. Viral load not detected 9 months ago.  - Continue home medications of tivicay 50 mg tab daily and descovy200-25 mg 1 tab daily  #Hx of Systolic Murmur Patient with 2/6 holosystolic murmur best heard over the left 5th intercostal space. Patient notes history of systolic murmur. Hx of severe mitral regurgitation. Last saw cardiology 01/06/20. Diagnosed with chronic systolic heart failure. Last echo 5/21, 20-25% EF. Grade 1 diastolic dysfunction.  - Recommend patient continue follow up with cardiology as outpatient.    #Hx COPD Patient with PMHx of COPD, no PFT's found in chart. Patient with extensive tobacco use history, quit 4 months ago. Patient uses PRN albuterol at home.  - Will continue to monitor respiratory status -  albuterol as  needed for acute episodes of shortness of breath  #Chronic Pain Patient has history of chronic pain related to his polycystic kidney and liver disease as well as chronic back pain. He denies any current worsening of his chronic pain - Will continue home medication of percocet 10-325 1 tab Q8H PRN for moderate/severe pain   #Thoracic Aortic Aneurysm Patient with history of stable thoracic aortic aneurysm with diameter of 4 cm. Last imaging on 03/2018. Do not suspect contributing to current abdominal pain. - Will continue to monitor for worsening abdominal pain or acute abdomen and consider imaging as necessary.   #Restless Leg Syndrome Patient with history of RLS that has been stable - Continue home medication of ropinirole 2 mg tab   Prior to Admission Living Arrangement: Home Anticipated Discharge Location: home Barriers to Discharge: continued inpatient treatment Dispo: Anticipated discharge in approximately 1-2 day(s).   Freida Busman, MD 08/24/2020, 2:30 PM Pager: [phone] After 5pm on weekdays and 1pm on weekends: On Call pager [phone]

## 2020-08-24 NOTE — Progress Notes (Signed)
Renal Navigator spoke with patient's OP HD/[facility]. Bell to discuss whether patient is still treating in isolation or if he has been cleared to return to his regular shift. Per Ms. Bell, patient had a negative test at his clinic on 08/21/20. Renal Navigator faxed negative test result from 08/23/20 and therefore, patient is cleared to return to regular 2nd shift seat off isolation. If patient is discharged on a MWF, he should go to his OP HD clinic for treatment. Renal Navigator following.  Alphonzo Cruise, [place] Renal Navigator [phone]

## 2020-08-24 NOTE — Progress Notes (Signed)
[facility] Progress Note  Subjective: seen in room, feeling good, no c/o today. No labs this am  Vitals:   08/24/20 0530 08/24/20 0600 08/24/20 0607 08/24/20 0738  BP: 108/67 (!) 99/55 (!) 108/58 119/75  Pulse: (!) 108 (!) 107 (!) 106 (!) 108  Resp: (!) 21 (!) 21 16 14   Temp:   (!) 97.5 F (36.4 C) 97.8 F (36.6 C)  TempSrc:   Oral Oral  SpO2:   94% 100%  Weight:   87.1 kg   Height:        Exam: Gen: up in chair, no distress Eyes: anicteric ENT: MMM Neck: supple, no JVD lying at 30 degrees CV:  RRR Abd:  Soft, nontender, + BS Lungs: normal WOB, R base rales / rhonchi, L clear Extr:  No edema, LUE AVF +t/b, large Neuro: grossly nonfocal Skin: clammy/diaphoretic     Dialysis Rx:  MWF GO  4h  500/800   79.5kg  2/2 bath L AVF Hep 5000  9/1 Hb 11.7, PHos 6.9, PTH 25, Ca 9.3, albumin 3.3 Calcitriol 35mcg TIW   Assessment/Plan **Dyspnea and weakness: CXR c/w worsening COVID19 PNA.  S/p remdesivir.  Additional therapy per primary.   **Hyperkalemia: had HD last night, needs lab today, will order.  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  HD MWF. HD last night here w/ 3 L off. Next HD tomorrow. If pt is to be dc'd tomorrow he can go to his OP center for Friday HD which is around 12 noon.   **Anemia:  Hb 12.2 no ESA needed  **HFrEF:  up 8kg by wt's, euvolemic on exam, wt's may be off  **BMM:  Calcitriol 2 TIW per outpt dose  **HIV: cont meds   Rob Henna Derderian 08/24/2020, 2:36 PM   Recent Labs  Lab 08/23/20 1723 08/24/20 0953  K >7.5*  --   BUN 68*  --   CREATININE 8.09*  --   CALCIUM 9.8  --   HGB 12.2* 11.6*   Inpatient medications: . [START ON 08/25/2020] calcitRIOL  2 mcg Oral Q M,W,F-HD  . dolutegravir  50 mg Oral Daily  . emtricitabine-tenofovir AF  1 tablet Oral Daily  . ferric citrate  420 mg Oral TID WC  . fluticasone  2 spray Each Nare Daily  . [START ON 08/25/2020] heparin  5,000 Units Dialysis Once in dialysis  . rOPINIRole  2 mg  Oral QHS  . warfarin  7.5 mg Oral ONCE-1600  . Warfarin - Pharmacist Dosing Inpatient   Does not apply q1600   . sodium chloride    . sodium chloride    . cefTRIAXone (ROCEPHIN)  IV Stopped (08/24/20 0224)   sodium chloride, sodium chloride, acetaminophen, alteplase, heparin, heparin, lidocaine (PF), lidocaine-prilocaine, oxyCODONE, pentafluoroprop-tetrafluoroeth, polyethylene glycol
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
## 2020-08-25 ENCOUNTER — Other Ambulatory Visit: Payer: Self-pay | Admitting: Internal Medicine

## 2020-08-25 ENCOUNTER — Telehealth: Payer: Self-pay | Admitting: Student

## 2020-08-25 ENCOUNTER — Inpatient Hospital Stay (HOSPITAL_COMMUNITY): Payer: Medicare Other

## 2020-08-25 DIAGNOSIS — Z86711 Personal history of pulmonary embolism: Secondary | ICD-10-CM

## 2020-08-25 LAB — RENAL FUNCTION PANEL
Albumin: 2.9 g/dL — ABNORMAL LOW (ref 3.5–5.0)
Anion gap: 15 (ref 5–15)
BUN: 46 mg/dL — ABNORMAL HIGH (ref 6–20)
CO2: 25 mmol/L (ref 22–32)
Calcium: 8.9 mg/dL (ref 8.9–10.3)
Chloride: 93 mmol/L — ABNORMAL LOW (ref 98–111)
Creatinine, Ser: 6.41 mg/dL — ABNORMAL HIGH (ref 0.61–1.24)
GFR calc Af Amer: 11 mL/min — ABNORMAL LOW (ref >60)
GFR calc non Af Amer: 10 mL/min — ABNORMAL LOW (ref >60)
Glucose, Bld: 84 mg/dL (ref 70–99)
Phosphorus: 8.3 mg/dL — ABNORMAL HIGH (ref 2.5–4.6)
Potassium: 6.5 mmol/L (ref 3.5–5.1)
Sodium: 133 mmol/L — ABNORMAL LOW (ref 135–145)

## 2020-08-25 LAB — CBC WITH DIFFERENTIAL/PLATELET
Abs Immature Granulocytes: 0.01 10*3/uL (ref 0.00–0.07)
Basophils Absolute: 0 10*3/uL (ref 0.0–0.1)
Basophils Relative: 1 %
Eosinophils Absolute: 0.1 10*3/uL (ref 0.0–0.5)
Eosinophils Relative: 3 %
HCT: 34.3 % — ABNORMAL LOW (ref 39.0–52.0)
Hemoglobin: 10.9 g/dL — ABNORMAL LOW (ref 13.0–17.0)
Immature Granulocytes: 0 %
Lymphocytes Relative: 39 %
Lymphs Abs: 1.4 10*3/uL (ref 0.7–4.0)
MCH: 35 pg — ABNORMAL HIGH (ref 26.0–34.0)
MCHC: 31.8 g/dL (ref 30.0–36.0)
MCV: 110.3 fL — ABNORMAL HIGH (ref 80.0–100.0)
Monocytes Absolute: 0.5 10*3/uL (ref 0.1–1.0)
Monocytes Relative: 13 %
Neutro Abs: 1.5 10*3/uL — ABNORMAL LOW (ref 1.7–7.7)
Neutrophils Relative %: 44 %
Platelets: 109 10*3/uL — ABNORMAL LOW (ref 150–400)
RBC: 3.11 MIL/uL — ABNORMAL LOW (ref 4.22–5.81)
RDW: 17.5 % — ABNORMAL HIGH (ref 11.5–15.5)
WBC: 3.5 10*3/uL — ABNORMAL LOW (ref 4.0–10.5)
nRBC: 0 % (ref 0.0–0.2)

## 2020-08-25 LAB — GLUCOSE, CAPILLARY
Glucose-Capillary: 104 mg/dL — ABNORMAL HIGH (ref 70–99)
Glucose-Capillary: 88 mg/dL (ref 70–99)
Glucose-Capillary: 92 mg/dL (ref 70–99)

## 2020-08-25 LAB — PROTIME-INR
INR: 2.2 — ABNORMAL HIGH (ref 0.8–1.2)
Prothrombin Time: 23.5 seconds — ABNORMAL HIGH (ref 11.4–15.2)

## 2020-08-25 MED ORDER — WARFARIN SODIUM 7.5 MG PO TABS
7.5000 mg | ORAL_TABLET | Freq: Once | ORAL | Status: AC
  Administered 2020-08-25: 7.5 mg via ORAL
  Filled 2020-08-25: qty 1

## 2020-08-25 MED ORDER — LORATADINE 10 MG PO TABS
10.0000 mg | ORAL_TABLET | Freq: Every day | ORAL | Status: DC
  Administered 2020-08-25 – 2020-08-26 (×2): 10 mg via ORAL
  Filled 2020-08-25 (×2): qty 1

## 2020-08-25 MED ORDER — HEPARIN SODIUM (PORCINE) 1000 UNIT/ML IJ SOLN
INTRAMUSCULAR | Status: AC
  Administered 2020-08-25: 5000 [IU]
  Filled 2020-08-25: qty 5

## 2020-08-25 MED ORDER — CALCITRIOL 0.5 MCG PO CAPS
ORAL_CAPSULE | ORAL | Status: AC
  Administered 2020-08-25: 2 ug via ORAL
  Filled 2020-08-25: qty 4

## 2020-08-25 NOTE — Telephone Encounter (Signed)
HFU per Marya Amsler social work; 09/17 330pm

## 2020-08-25 NOTE — Progress Notes (Signed)
[facility] Progress Note  Subjective: seen on HD, no new c/o's, denies any cough/ SOB  Vitals:   08/25/20 0900 08/25/20 0930 08/25/20 1000 08/25/20 1030  BP: 111/64 118/69 110/68 113/67  Pulse: 94 96 95 94  Resp:      Temp:      TempSrc:      SpO2:      Weight:      Height:        Exam: Gen: up in chair, no distress Eyes: anicteric ENT: MMM Neck: supple, no JVD lying at 30 degrees CV:  RRR Abd:  Soft, nontender, + BS Lungs: normal WOB, R base rales / rhonchi, L clear Extr:  No edema, LUE AVF +t/b, large Neuro: grossly nonfocal Skin: clammy/diaphoretic     Dialysis Rx:  MWF GO  4h  500/800   79.5kg  2/2 bath L AVF Hep 5000  9/1 Hb 11.7, PHos 6.9, PTH 25, Ca 9.3, albumin 3.3 Calcitriol 64mcg TIW   Assessment/Plan 1. Dyspnea/ weakness:  getting IV rocephin for suspcted bact PNA. Additional therapy per primary.  2. Hyperkalemia: K+ up 6.5 this am, low K+ bath on HD 3. 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  HD mwf. HD today 4. Anemia:  Hb 12.2 no ESA needed 5. HFrEF: up 2kg by wts, UF the same w/ HD today 6. BMM:  Calcitriol 2 TIW per outpt dose 7. HIV: cont meds   Brian [name] 08/25/2020, 11:01 AM   Recent Labs  Lab 08/23/20 1723 08/24/20 0951 08/24/20 0951 08/24/20 0953 08/24/20 1538 08/25/20 0106  K  --  5.5*   < >  --  6.2* 6.5*  BUN  --  34*   < >  --  39* 46*  CREATININE  --  5.06*   < >  --  5.72* 6.41*  CALCIUM  --  9.4   < >  --  9.5 8.9  PHOS  --  6.1*  --   --   --  8.3*  HGB   < >  --   --  11.6*  --  10.9*   < > = values in this interval not displayed.   Inpatient medications:  calcitRIOL  2 mcg Oral Q M,W,F-HD   dolutegravir  50 mg Oral Daily   dronabinol  5 mg Oral QAC lunch   emtricitabine-tenofovir AF  1 tablet Oral Daily   ferric citrate  420 mg Oral TID WC   fluticasone  2 spray Each Nare Daily   heparin  5,000 Units Dialysis Once in dialysis   rOPINIRole  2 mg Oral QHS   Warfarin - Pharmacist Dosing Inpatient    Does not apply q1600    sodium chloride     sodium chloride     cefTRIAXone (ROCEPHIN)  IV 2 g (08/24/20 2348)   sodium chloride, sodium chloride, acetaminophen, alteplase, heparin, heparin, lidocaine (PF), lidocaine-prilocaine, oxyCODONE, pentafluoroprop-tetrafluoroeth, polyethylene glycol
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
## 2020-08-25 NOTE — Progress Notes (Signed)
Subjective: Patient doing well this AM. In HD bed during interview. He reports that he continues to have dizziness when moving his head around. Dizziness is worse when turning or looking towards the right but occurs in all directions. Otherwise patient feels fine and has hemoptysis only after waking up and has noted that the sputum is also yellow in the AM.  Objective:  Vital signs in last 24 hours: Vitals:   08/25/20 1030 08/25/20 1100 08/25/20 1128 08/25/20 1243  BP: 113/67 (!) 104/49 (!) 115/59 98/76  Pulse: 94 78 91 (!) 112  Resp:   18 20  Temp:   98.1 F (36.7 C) 98.5 F (36.9 C)  TempSrc:   Oral   SpO2:   96%   Weight:   79.3 kg   Height:       Physical Exam Constitutional:      Appearance: Normal appearance.  Eyes:     Extraocular Movements: Extraocular movements intact.     Comments: ecthymatous sclera  Cardiovascular:     Rate and Rhythm: Normal rate and regular rhythm.  Pulmonary:     Effort: Pulmonary effort is normal.     Breath sounds: Normal breath sounds.     Comments: Apical and lateral lobes assessed Abdominal:     General: Abdomen is flat. Bowel sounds are normal.     Palpations: Abdomen is soft.  Neurological:     Mental Status: He is alert.  Psychiatric:        Mood and Affect: Mood normal.        Behavior: Behavior normal.     Assessment/Plan:  Active Problems:   Hyperkalemia  [name] is a 44 year old male with PMHx of end stage renal disease on hemodialysis Mon, Wed, Friday, abdominal aortic aneurysm (stable at 4cm), polycystic kidney disease, anemia of chronic disease, chronic bronchitis, COPD (not on home oxygen), HIV (undetectable viral load), COVID pneumonia on 8/24 with hypovolemic shock in setting of GI losses PE on warfarin presenting with generalized fatigue, dyspnea on exertion, and hemoptysis for the past 6 days along with one day of diarrhea, maxillary sinus tenderness, and abdominal tenderness.  #Weakness/Dyspnea-  Improving Patient reports decreased SOB but continues to have hemoptysis although it is now only in the AM after waking up. Patient had clear apical and lateral lobes while in HD this AM. OT and PT saw patient and do not recommend follow-up. CBC this AM 3.5. - Low suspicion for PCP in light of patient's HIV, his viral load is stable - 2 g rocephin IV ordered 3 days. May transition to PO Azithromycin as QT prolongation appears to have resolved   #ESRD with on Hemodialysis Received HD this AM. K+ prior to HD was 6.5. 3L removed during emergent HD. Patient is approx 2kg over dry weights. Patient was continued on low K+ bath in HD. EKG was not significant for peaked T waves. - Will repeat renal function panel in the morning - Daily weights to be monitored - nephro consulted, recommendation appreciated   #Maxillary sinus and ear pain Patient continues to have dizziness with movement of his head. Patient reports that this is worse when he turns his head to the right. PT has noted mild unsteadiness when head turns during gait. He has been seen in the past at [facility] by Dr. Redmond Baseman of otolaryngology that diagnosed patient with benign paroxysmal positional vertigo.  -- Follow-up CT head w/o contrast and/or maxillary -- Will recommend patient  follow up with Dr. Redmond Baseman as in his prior note from 06/15/20 he states " patient to call back if vertigo continues and will proceed with dix-hallpike testing." - Continue to monitor for worsening sinus and ear pain, consider antibiotics if symptoms worsen for possible sinusitis -- start loratadine 10mg  - Patient with history of rhinitis/allergies, flonase 50 mcg nasal spray, 2 sprays into each nare daily  #Anemia Hemoglobin of 12.2>11.6>10.9, prior 11.5/11.0. MCV of 110.3. Suspected etiology is anemia due to ESRD with single kidney vs b12 or folate deficiency. Nephrology consulted and do not believe patient requires ESA at this time.   Folate 6.9, Vit B12 365. 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 - Continue daily CBC's - If patient's hgb drops, will consider ESA  #Hx of HIV Patient with history of HIV, followed by infectious disease. He notes a stable viral load on descovy and tivicay. Last appointment with ID on 12/21/2019. Viral load not detected 9 months ago.  - Continue home medications of tivicay 50 mg tab daily and descovy200-25 mg 1 tab daily  #Hx of Systolic Murmur Patient with 2/6 holosystolic murmur best heard over the left 5th intercostal space. Patient notes history of systolic murmur. Hx of severe mitral regurgitation. Last saw cardiology 01/06/20. Diagnosed with chronic systolic heart failure. Last echo 5/21, 20-25% EF. Grade 1 diastolic dysfunction.  - Recommend patient continue follow up with cardiology as outpatient.   #Hx COPD Patient with PMHx of COPD, no PFT's found in chart. Patient with extensive tobacco use history, quit 4 months ago. Patient uses PRN albuterol at home.  - Will continue to monitor respiratory status -  albuterol as needed for acute episodes of shortness of breath  #Chronic Pain Patient has history of chronic pain related to his polycystic kidney and liver disease as well as chronic back pain. He denies any current worsening of his chronic pain - Will continue home medication of percocet 10-325 1 tab Q8H PRN for moderate/severe pain   #Thoracic Aortic Aneurysm Patient with history of stable thoracic aortic aneurysm with diameter of 4 cm. Last imaging on 03/2018. Do not suspect contributing to current abdominal pain. - Will continue to monitor for worsening abdominal pain or acute abdomen and consider imaging as necessary.   #Restless Leg Syndrome Patient with history of RLS that has been stable - Continue home medication of ropinirole 2 mg tab  Prior to Admission Living Arrangement:Home Anticipated Discharge Location:Home Barriers to Discharge:  Treatment Dispo: Anticipated discharge in approximately 1-2 day(s).   Freida Busman, MD 08/25/2020, 1:23 PM Pager:[phone] After 5pm on weekdays and 1pm on weekends: On Call pager [phone]

## 2020-08-25 NOTE — Progress Notes (Signed)
Pt left floor for tx.

## 2020-08-26 LAB — CBC WITH DIFFERENTIAL/PLATELET
Abs Immature Granulocytes: 0 10*3/uL (ref 0.00–0.07)
Basophils Absolute: 0 10*3/uL (ref 0.0–0.1)
Basophils Relative: 1 %
Eosinophils Absolute: 0.1 10*3/uL (ref 0.0–0.5)
Eosinophils Relative: 5 %
HCT: 31.5 % — ABNORMAL LOW (ref 39.0–52.0)
Hemoglobin: 10.1 g/dL — ABNORMAL LOW (ref 13.0–17.0)
Immature Granulocytes: 0 %
Lymphocytes Relative: 47 %
Lymphs Abs: 1.2 10*3/uL (ref 0.7–4.0)
MCH: 35.6 pg — ABNORMAL HIGH (ref 26.0–34.0)
MCHC: 32.1 g/dL (ref 30.0–36.0)
MCV: 110.9 fL — ABNORMAL HIGH (ref 80.0–100.0)
Monocytes Absolute: 0.4 10*3/uL (ref 0.1–1.0)
Monocytes Relative: 15 %
Neutro Abs: 0.8 10*3/uL — ABNORMAL LOW (ref 1.7–7.7)
Neutrophils Relative %: 32 %
Platelets: 97 10*3/uL — ABNORMAL LOW (ref 150–400)
RBC: 2.84 MIL/uL — ABNORMAL LOW (ref 4.22–5.81)
RDW: 17.2 % — ABNORMAL HIGH (ref 11.5–15.5)
WBC: 2.6 10*3/uL — ABNORMAL LOW (ref 4.0–10.5)
nRBC: 0 % (ref 0.0–0.2)

## 2020-08-26 LAB — RENAL FUNCTION PANEL
Albumin: 2.8 g/dL — ABNORMAL LOW (ref 3.5–5.0)
Anion gap: 13 (ref 5–15)
BUN: 26 mg/dL — ABNORMAL HIGH (ref 6–20)
CO2: 27 mmol/L (ref 22–32)
Calcium: 8.5 mg/dL — ABNORMAL LOW (ref 8.9–10.3)
Chloride: 96 mmol/L — ABNORMAL LOW (ref 98–111)
Creatinine, Ser: 5.03 mg/dL — ABNORMAL HIGH (ref 0.61–1.24)
GFR calc Af Amer: 15 mL/min — ABNORMAL LOW (ref >60)
GFR calc non Af Amer: 13 mL/min — ABNORMAL LOW (ref >60)
Glucose, Bld: 86 mg/dL (ref 70–99)
Phosphorus: 7 mg/dL — ABNORMAL HIGH (ref 2.5–4.6)
Potassium: 5 mmol/L (ref 3.5–5.1)
Sodium: 136 mmol/L (ref 135–145)

## 2020-08-26 LAB — GLUCOSE, CAPILLARY
Glucose-Capillary: 93 mg/dL (ref 70–99)
Glucose-Capillary: 89 mg/dL (ref 70–99)

## 2020-08-26 LAB — QUANTIFERON-TB GOLD PLUS (RQFGPL)
QuantiFERON Mitogen Value: >10 IU/mL
QuantiFERON Nil Value: 0.05 IU/mL
QuantiFERON TB1 Ag Value: 0.05 IU/mL
QuantiFERON TB2 Ag Value: 0.05 IU/mL

## 2020-08-26 LAB — QUANTIFERON-TB GOLD PLUS: QuantiFERON-TB Gold Plus: NEGATIVE

## 2020-08-26 LAB — PROTIME-INR
INR: 2.2 — ABNORMAL HIGH (ref 0.8–1.2)
Prothrombin Time: 23.9 seconds — ABNORMAL HIGH (ref 11.4–15.2)

## 2020-08-26 MED ORDER — AZITHROMYCIN 500 MG PO TABS: 500.0000 mg | ORAL_TABLET | Freq: Every day | ORAL | 0 refills | Status: AC

## 2020-08-26 MED ORDER — LORATADINE 10 MG PO TABS
10.0000 mg | ORAL_TABLET | Freq: Every day | ORAL | 0 refills | Status: DC
Start: 2020-08-27 — End: 2021-07-17

## 2020-08-26 MED ORDER — AMOXICILLIN-POT CLAVULANATE 875-125 MG PO TABS: 1.0000 | ORAL_TABLET | Freq: Two times a day (BID) | ORAL | 0 refills | Status: AC

## 2020-08-26 MED ORDER — WARFARIN SODIUM 7.5 MG PO TABS: 7.5000 mg | ORAL_TABLET | Freq: Once | ORAL | Status: DC
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
## 2020-08-26 NOTE — Progress Notes (Signed)
Subjective: No overnight events. Patient feeling well this AM. Still having pain in his sinuses, but otherwise feels okay. No chest pain, no SOB with rest, no acute abdominal pain.   Objective:  Vital signs in last 24 hours: Vitals:   08/25/20 1100 08/25/20 1128 08/25/20 1243 08/25/20 2138  BP: (!) 104/49 (!) 115/59 98/76 99/66   Pulse: 78 91 (!) 112 95  Resp:  18 20 18   Temp:  98.1 F (36.7 C) 98.5 F (36.9 C) 98.1 F (36.7 C)  TempSrc:  Oral  Oral  SpO2:  96%  99%  Weight:  79.3 kg    Height:       Physical Exam Constitutional:      Appearance: Normal appearance.  HENT:     Head: Normocephalic and atraumatic.     Comments: Tender to palpation of the maxillary facial sinuses    Nose:     Comments: L nare chronically occluded, R nare slightly erythematous Cardiovascular:     Rate and Rhythm: Normal rate and regular rhythm.     Heart sounds: Murmur heard.      Comments: Murmur loudest at 5th intercoastal space Pulmonary:     Effort: Pulmonary effort is normal.     Comments: Diffuse crackles in all 4 lobes Abdominal:     General: Abdomen is flat. Bowel sounds are normal.     Palpations: Abdomen is soft.     Comments: Chronic tenderness diffusely, hx of multiple hernias  Neurological:     General: No focal deficit present.     Mental Status: He is alert.     Assessment/Plan:  Active Problems:   Hyperkalemia Brian [name] is a 44 year old male with PMHx of end stage renal disease on hemodialysis Mon, Wed, Friday, abdominal aortic aneurysm (stable at 4cm), polycystic kidney disease, anemia of chronic disease, chronic bronchitis, COPD (not on home oxygen), HIV (undetectable viral load), COVID pneumonia on 8/24 with hypovolemic shock in setting of GI losses PE on warfarin presenting with generalized fatigue, dyspnea on exertion, and hemoptysis for the past 6 days along with one day of diarrhea, maxillary sinus tenderness, and abdominal  tenderness.  #Weakness/Dyspnea- Improving Patient reports decreased SOB but continues to have hemoptysis although it is now only in the AM after waking up. Patient now reports that he has also had some minor bleeding from the nose around the same time as hemoptysis.  Patient had clear apical and lateral lobes while in HD this AM. Instructed patient to continue self- ambulation to increase endurance. WBC this AM 2.2.   - Low suspicion for PCP in light of patient's HIV, his viral load is stable - Patient has received 3 doses of ceftriaxone. Will discontinue. Do not feel necessary to continue treatment for bacterial process.  - HIV RNA   #ESRD with on Hemodialysis K+ 5.0 this AM.  Patient reported that he will not continue significant intake of Orange Juice as he knows what he was drinking was high in Potassium and made him hyperkalemic regularly.  - Will repeat renal function panel in the morning - Daily weights to be monitored - nephro consulted, recommendation appreciated   #Maxillary sinus and ear pain Patient continues to have dizziness with movement of his head. Patient continues to report that this is worse when he turns his head to the right.  Concern for possible Mucor although patient does not clinically appear sick with this disease. As patient continues to have tenderness of the sinuses and reports  minor nosebleeds. Patient received  CT maxillofacial which noted minimal mucosal thickening within the the inferior maxillary sinus and left sphenoid sinus.  -- Will recommend patient follow up with Dr. Redmond Baseman as in his prior note from 06/15/20 he states " patient to call back if vertigo continues and will proceed with dix-hallpike testing." - Considering abx -- Continue loratadine 10mg  - Patient with history of rhinitis/allergies, flonase 50 mcg nasal spray, 2 sprays into each nare daily  #Anemia Hemoglobin of 12.2>11.6>10.9> 10.1, prior 11.5/11.0. MCV of 110.9. Suspected etiology is  anemia due to ESRD with single kidney vs b12 or folate deficiency. Nephrology consulted and do not believe patient requires ESA at this time.Folate 6.9, Vit B12 365. 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 - Continue daily CBC's - If patient's hgb drops, will consider ESA  #Hx of HIV Patient with history of HIV, followed by infectious disease. He notes a stable viral load on descovy and tivicay. Last appointment with ID on 12/21/2019. Viral load not detected 9 months ago.  - Continue home medications of tivicay 50 mg tab daily and descovy200-25 mg 1 tab daily - f/u HIV RNA   #Hx COPD Patient with PMHx of COPD, no PFT's found in chart. Patient with extensive tobacco use history, quit 4 months ago. Patient uses PRN albuterol at home.  - Will continue to monitor respiratory status - albuterol as needed for acute episodes of shortness of breath  #Chronic Pain Patient has history of chronic pain related to his polycystic kidney and liver disease as well as chronic back pain. He denies any current worsening of his chronic pain - Will continue home medication of percocet10-325 1 tab Q8H PRN for moderate/severe pain   #Restless Leg Syndrome Patient with history of RLS that has been stable - Continue home medication of ropinirole 2 mg tab  Prior to Admission Living Arrangement: Home Anticipated Discharge Location: Home Barriers to Discharge: Follow HIV RNA lab, likely home today if stable Dispo: Anticipated discharge in approximately 1 day(s).   Freida Busman, MD 08/26/2020, 7:31 AM Pager: [phone] After 5pm on weekdays and 1pm on weekends: On Call pager [phone]

## 2020-08-26 NOTE — Discharge Instructions (Signed)
Dear Brian [name],   Thank you so much for allowing [place] to be part of your care!  You were admitted to [facility] for Hyperkalemia.   POST-HOSPITAL & CARE INSTRUCTIONS 1. Please follow up 9/17 at the Internal Medicine Clinic to reassess you sinus pain, nosebleeds, and morning bloody sputum. 2. Please take 2 day course of Augnmentin (taken twice daily) and Azithromycin (once daily). 3. Continue to increase activity slowly.  4. Please let PCP/Specialists know of any changes that were made.  5. Please see medications section of this packet for any medication changes.   DOCTOR'S APPOINTMENT & FOLLOW UP CARE INSTRUCTIONS  Future Appointments  Date Time Provider [place]  09/01/2020  3:30 PM Cato Mulligan, MD [facility]  10/19/2020  1:15 PM [name], MD [facility]    RETURN PRECAUTIONS:   Take care and be well!  Internal [facility]  [address] [place], [place] [address] [phone]

## 2020-08-26 NOTE — TOC Transition Note (Signed)
Transition of Care [facility]) - CM/SW Discharge Note   Patient Details  Name: LYTLE MALBURG MRN: [id number] Date of Birth: October 17, 1976  Transition of Care [facility]) CM/SW Contact:  Elliot Gurney [place], [place] Phone Number: [phone] 08/26/2020, 3:48 PM   Clinical Narrative:    Taxi voucher provided to patient for discharge home today.       [place], LCSW Transitions of Care [phone]  Patient Goals and CMS Choice        Discharge Placement                       Discharge Plan and Services                                     Social Determinants of Health (SDOH) Interventions     Readmission Risk Interventions Readmission Risk Prevention Plan 08/25/2020  Transportation Screening Complete  PCP or Specialist Appt within 3-5 Days Complete  HRI or [place] Complete  Social Work Consult for [place] Planning/Counseling Complete  Palliative Care Screening Not Applicable  Medication Review Press photographer) Complete  Some recent data might be hidden

## 2020-08-26 NOTE — Discharge Summary (Signed)
Name: Brian [name] MRN: [id number] DOB: 1976-11-06 44 y.o. PCP: [name], MD  Date of Admission: 08/23/2020  4:59 PM Date of Discharge:  Attending Physician: Angelica Pou, MD  Discharge Diagnosis: 1. Hyperkalemia  Discharge Medications: Allergies as of 08/26/2020      Reactions   Clindamycin/lincomycin Other (See Comments)   Severe pancytopenia (condition in which there is a lower-than-normal number of red and white blood cells and platelets in the blood)   Chlorhexidine Hives   Adhesive [tape] Rash, Other (See Comments)   Skin stays scarred- doesn't heal well   Cat Hair Extract Other (See Comments)   Sneezing      Medication List    STOP taking these medications   Banophen 25 MG tablet Generic drug: diphenhydrAMINE   diphenhydrAMINE 25 mg capsule Commonly known as: BENADRYL     TAKE these medications   albuterol 108 (90 Base) MCG/ACT inhaler Commonly known as: VENTOLIN HFA INHALE 2 PUFFS INTO THE LUNGS EVERY 6 HOURS AS NEEDED FOR WHEEZING OR SHORTNESS OF BREATH What changed:   how much to take  how to take this  when to take this  reasons to take this  additional instructions   amoxicillin-clavulanate 875-125 MG tablet Commonly known as: Augmentin Take 1 tablet by mouth 2 (two) times daily for 2 days.   Auryxia 1 GM 210 MG(Fe) tablet Generic drug: ferric citrate Take 210-420 mg by mouth See admin instructions. Take 420 mg by mouth three times daily BEFORE meals and 210 mg BEFORE each snack   azithromycin 500 MG tablet Commonly known as: Zithromax Take 1 tablet (500 mg total) by mouth daily for 2 days. Take 1 tablet daily for 3 days.   Descovy 200-25 MG tablet Generic drug: emtricitabine-tenofovir AF Take 1 tablet by mouth daily. What changed: when to take this   dronabinol 5 MG capsule Commonly known as: MARINOL Take 1 capsule (5 mg total) by mouth daily before lunch.   fluticasone 50 MCG/ACT nasal spray Commonly known as:  FLONASE Place 2 sprays into both nostrils daily. What changed:   when to take this  reasons to take this   Generlac 10 GM/15ML Soln Generic drug: lactulose (encephalopathy) Take 20 g by mouth daily as needed (constipation).   loratadine 10 MG tablet Commonly known as: CLARITIN Take 1 tablet (10 mg total) by mouth daily. Start taking on: August 27, 2020   oxyCODONE-acetaminophen 10-325 MG tablet Commonly known as: Percocet Take 1 tablet by mouth every 8 (eight) hours as needed for pain. What changed: when to take this   Ricola w/Echinacea 3.5 MG Lozg Generic drug: Menthol Use as directed 1 lozenge in the mouth or throat as needed (for throat discomfort).   rOPINIRole 2 MG tablet Commonly known as: REQUIP Take 1 tablet (2 mg total) by mouth at bedtime for 2 days. What changed: when to take this   Tivicay 50 MG tablet Generic drug: dolutegravir Take 1 tablet (50 mg total) by mouth daily. What changed: when to take this   warfarin 5 MG tablet Commonly known as: COUMADIN Take as directed. If you are unsure how to take this medication, talk to your nurse or doctor. Original instructions: TAKE 1 AND 1/2 TABLETS(7.5 MG) BY MOUTH DAILY What changed: See the new instructions.       Disposition and follow-up:   Mr.Wilmon L Motton was discharged from [facility] in Stable condition.  At the hospital follow up visit please address:  1.  Please address whether AM hemoptysis and minor epistaxis are improving.  2. Please address if patient sinus pain is improving. 3. Follow-up patient progress post-Covid infection 4. Please address if dyspnea with exertion is worsening.    Labs / imaging needed at time of follow-up: None    Pending labs/ test needing follow-up: HIV PCR and CD4  Follow-up Appointments:  Follow-up Information    [place]. Go on 09/01/2020.   Why: Please attend your hospital discharge appt on Friday, 09/01/20, at  3:30pm.  Contact information: [address] [place] [facility] Course by problem list: Mr. Kalden Wanke is a 44 year old male with PMHx of end stage renal disease on hemodialysis Mon, Wed, Friday, abdominal aortic aneurysm (stable at 4cm), polycystic kidney disease, anemia of chronic disease, chronic bronchitis, COPD (not on home oxygen), HIV (undetectable viral load), COVID pneumonia on 8/24 with hypovolemic shock in setting of GI losses PE on warfarin presenting with generalized fatigue, dyspnea on exertion, and hemoptysis for the past 6 days along with one day of diarrhea, maxillary sinus tenderness, and abdominal tenderness.  [name] Despite recent admission with COVID patient tested negative for COVID in the ED and also had tested negative for COVID 1 day prior to presentation at dialysis center.  On presentation patient was hyperkalemic and patient NA HCO3 bolus.  CXR on presentation was remarkable for multifocal pneumonia likely representative of COVID-19 infection.  WBC was initially elevated with elevated absolute Neutrophil count, patient received 3 doses of ceftriaxone for possible superimposed bacterial PNA.  WBC improved to patient's baseline.  Discharged with 2 days of Augmentin and Azithromycin. Initially patient had O2 requirement but improved and was able to breathe comfortably on room air however continued to have minor dyspnea with ambulation.  Hemoptysis improved and became less frequent over course of hospitalization.  PT and OT saw patient neither recommended follow-up, PT recommended patient continue to build up stamina on his own.  ESRD with HD hyperkalemia Patient presented with potassium greater than 7.5.  Patient noted having missed dialysis that day however had last received it on Monday 2 days prior to presentation.  Received emergent dialysis night of admission remains hyperkalemic with some improvement.   Received HD again next day on schedule and potassium decreased WNL.  Acute hyperkalemia likely due to significant p.o. intake of orange juice.  Maxillary sinus and ear pain Patient had maxillary sinus and frontal sinus irritation and tenderness.  Also noted to have right tympanic membrane erythema.  PT and OT saw patient PT noticed that patient was having some unsteadiness with gait when turning head especially towards the right.  Flonase did not appear to alleviate symptoms.  CT maxillofacial noted minimal mucosal thickening within the inferior right maxillary sinus and left sphenoid sinus but no sinus air-fluid levels.    Anemia Patient noted to have megaloblastic anemia thought to be likely 2/2 HIV medications or HIV infection.  RBC anemia was also likely due to ESRD with a single kidney.  B12 and folate were not deficient.  History of PE Continue patient warfarin managed by pharmacy.  PT/INR was appropriate at admission and remained therapeutic throughout hospitalization.  History of HIV Continue patient antiretrovirals.    Hx COPD Monitor respiratory status noted improving breath sounds.  Albuterol as needed.  Chronic pain Continue patient home medication of Percocet 10-325 nightly as needed.  Restless leg  syndrome Continued patient home medication of ropinirole 2 mg.    Discharge Vitals:   BP 113/71 (BP Location: Right Arm)   Pulse (!) 106   Temp 98 F (36.7 C)   Resp 17   Ht 6\' 3"  (1.905 m)   Wt 79.3 kg   SpO2 96%   BMI 21.85 kg/m   Pertinent Labs, Studies, and Procedures:  WBC 6.6>3.3.>3.3>2.6 PT/INR:25.1/2.4>23.5/2.2 K:>>7.5>5.5>6.2>6.5>5.0 OQH:UTMLYYTKPT:  Pattern of interstitial prominence and airspace disease consistent with multifocal pneumonia, compatible with COVID-19. CT Maxillofacial: IMPRESSION: Minimal mucosal thickening within the inferior right maxillary sinus and left sphenoid sinus. No sinus air-fluid levels.  Discharge Instructions: Discharge  Instructions    Increase activity slowly   Complete by: As directed     Please continue to increase ambulation and slowly increase activity. Please follow up at the Internal Medicine Clinic 9/17.  Signed: Freida Busman, MD 08/26/2020, 4:09 PM   Pager: [phone]

## 2020-08-26 NOTE — Hospital Course (Addendum)
Brian [name] is a 44 year old male with PMHx of end stage renal disease on hemodialysis Mon, Wed, Friday, abdominal aortic aneurysm (stable at 4cm), polycystic kidney disease, anemia of chronic disease, chronic bronchitis, COPD (not on home oxygen), HIV (undetectable viral load), COVID pneumonia on 8/24 with hypovolemic shock in setting of GI losses PE on warfarin presenting with generalized fatigue, dyspnea on exertion, and hemoptysis for the past 6 days along with one day of diarrhea, maxillary sinus tenderness, and abdominal tenderness.  [name] Despite recent admission with COVID patient tested negative for COVID in the ED and also had tested negative for COVID 1 day prior to presentation at dialysis center.  On presentation patient was hyperkalemic and patient NA HCO3 bolus.  CXR on presentation was remarkable for multifocal pneumonia likely representative of COVID-19 infection.  WBC was initially elevated with elevated absolute Neutrophil count, patient received 3 doses of ceftriaxone for possible superimposed bacterial PNA.  WBC improved to patient's baseline.  Discharged with 2 days of Augmentin and Azithromycin. Initially patient had O2 requirement but improved and was able to breathe comfortably on room air however continued to have minor dyspnea with ambulation.  Hemoptysis improved and became less frequent over course of hospitalization.  PT and OT saw patient neither recommended follow-up, PT recommended patient continue to build up stamina on his own.  ESRD with HD hyperkalemia Patient presented with potassium greater than 7.5.  Patient noted having missed dialysis that day however had last received it on Monday 2 days prior to presentation.  Received emergent dialysis night of admission remains hyperkalemic with some improvement.  Received HD again next day on schedule and potassium decreased WNL.  Acute hyperkalemia likely due to significant p.o. intake of orange  juice.  Maxillary sinus and ear pain Patient had maxillary sinus and frontal sinus irritation and tenderness.  Also noted to have right tympanic membrane erythema.  PT and OT saw patient PT noticed that patient was having some unsteadiness with gait when turning head especially towards the right.  Flonase did not appear to alleviate symptoms.  CT maxillofacial noted minimal mucosal thickening within the inferior right maxillary sinus and left sphenoid sinus but no sinus air-fluid levels.    Anemia Patient noted to have megaloblastic anemia thought to be likely 2/2 HIV medications or HIV infection.  RBC anemia was also likely due to ESRD with a single kidney.  B12 and folate were not deficient.  History of PE Continue patient warfarin managed by pharmacy.  PT/INR was appropriate at admission and remained therapeutic throughout hospitalization.  History of HIV Continue patient antiretrovirals.    Hx COPD Monitor respiratory status noted improving breath sounds.  Albuterol as needed.  Chronic pain Continue patient home medication of Percocet 10-325 nightly as needed.  Restless leg syndrome Continued patient home medication of ropinirole 2 mg.

## 2020-08-27 LAB — HIV-1 RNA QUANT-NO REFLEX-BLD
HIV 1 RNA Quant: <20 copies/mL
LOG10 HIV-1 RNA: UNDETERMINED log10copy/mL

## 2020-08-28 DIAGNOSIS — E875 Hyperkalemia: Secondary | ICD-10-CM | POA: Diagnosis not present

## 2020-08-28 DIAGNOSIS — D509 Iron deficiency anemia, unspecified: Secondary | ICD-10-CM | POA: Diagnosis not present

## 2020-08-28 DIAGNOSIS — N2581 Secondary hyperparathyroidism of renal origin: Secondary | ICD-10-CM | POA: Diagnosis not present

## 2020-08-28 DIAGNOSIS — Z992 Dependence on renal dialysis: Secondary | ICD-10-CM | POA: Diagnosis not present

## 2020-08-28 DIAGNOSIS — N186 End stage renal disease: Secondary | ICD-10-CM | POA: Diagnosis not present

## 2020-08-29 LAB — CULTURE, BLOOD (ROUTINE X 2)
Culture: NO GROWTH
Culture: NO GROWTH
Special Requests: ADEQUATE
Special Requests: ADEQUATE

## 2020-08-29 NOTE — Telephone Encounter (Signed)
Transition Care Management Unsuccessful Follow-up Telephone Call  Date of discharge and from where:  [facility] 08/26/2020  Attempts:  1st Attempt  Reason for unsuccessful TCM follow-up call:  Left voice message   L. Princess Karnes, BSN, RN-BC

## 2020-08-30 DIAGNOSIS — N2581 Secondary hyperparathyroidism of renal origin: Secondary | ICD-10-CM | POA: Diagnosis not present

## 2020-08-30 DIAGNOSIS — N186 End stage renal disease: Secondary | ICD-10-CM | POA: Diagnosis not present

## 2020-08-30 DIAGNOSIS — D509 Iron deficiency anemia, unspecified: Secondary | ICD-10-CM | POA: Diagnosis not present

## 2020-08-30 DIAGNOSIS — E875 Hyperkalemia: Secondary | ICD-10-CM | POA: Diagnosis not present

## 2020-08-30 DIAGNOSIS — Z992 Dependence on renal dialysis: Secondary | ICD-10-CM | POA: Diagnosis not present

## 2020-09-01 ENCOUNTER — Encounter: Payer: Self-pay | Admitting: Student

## 2020-09-01 ENCOUNTER — Other Ambulatory Visit: Payer: Self-pay

## 2020-09-01 ENCOUNTER — Ambulatory Visit (INDEPENDENT_AMBULATORY_CARE_PROVIDER_SITE_OTHER): Payer: Medicare Other | Admitting: Student

## 2020-09-01 DIAGNOSIS — D696 Thrombocytopenia, unspecified: Secondary | ICD-10-CM

## 2020-09-01 DIAGNOSIS — D509 Iron deficiency anemia, unspecified: Secondary | ICD-10-CM | POA: Diagnosis not present

## 2020-09-01 DIAGNOSIS — I700 Atherosclerosis of aorta: Secondary | ICD-10-CM

## 2020-09-01 DIAGNOSIS — U071 COVID-19: Secondary | ICD-10-CM

## 2020-09-01 DIAGNOSIS — Z992 Dependence on renal dialysis: Secondary | ICD-10-CM | POA: Diagnosis not present

## 2020-09-01 DIAGNOSIS — N2581 Secondary hyperparathyroidism of renal origin: Secondary | ICD-10-CM | POA: Diagnosis not present

## 2020-09-01 DIAGNOSIS — E875 Hyperkalemia: Secondary | ICD-10-CM | POA: Diagnosis not present

## 2020-09-01 DIAGNOSIS — N186 End stage renal disease: Secondary | ICD-10-CM | POA: Diagnosis not present

## 2020-09-01 NOTE — Assessment & Plan Note (Signed)
Patient previously on statin, however discontinued in the past due to his ESRD. -Continue to monitor -Follow-up with PCP to determine whether initiating statin is appropriate.
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
## 2020-09-01 NOTE — Assessment & Plan Note (Signed)
Patient noted to continue have thrombocytopenia on labs during hospitalization. Condition is stable at this time. -Continue to monitor

## 2020-09-01 NOTE — Assessment & Plan Note (Signed)
Patient evaluated by telehealth appointment today rather than scheduled in-office visit due to being at dialysis. Patient states that since his hospital discharge he has been breathing much better, not having as much shortness of breath with the minimal exertion that he does, and that overall he feels much better. He has continued to cough up a minimal amount of blood on occasion, however this is much improved from prior. He denies having continued nose bleeds. Otherwise, he has no new complaints. He is no longer in quarantine from his Covid infection. -Nothing to do

## 2020-09-04 DIAGNOSIS — D509 Iron deficiency anemia, unspecified: Secondary | ICD-10-CM | POA: Diagnosis not present

## 2020-09-04 DIAGNOSIS — N186 End stage renal disease: Secondary | ICD-10-CM | POA: Diagnosis not present

## 2020-09-04 DIAGNOSIS — N2581 Secondary hyperparathyroidism of renal origin: Secondary | ICD-10-CM | POA: Diagnosis not present

## 2020-09-04 DIAGNOSIS — Z992 Dependence on renal dialysis: Secondary | ICD-10-CM | POA: Diagnosis not present

## 2020-09-04 DIAGNOSIS — E875 Hyperkalemia: Secondary | ICD-10-CM | POA: Diagnosis not present

## 2020-09-06 DIAGNOSIS — Z992 Dependence on renal dialysis: Secondary | ICD-10-CM | POA: Diagnosis not present

## 2020-09-06 DIAGNOSIS — D509 Iron deficiency anemia, unspecified: Secondary | ICD-10-CM | POA: Diagnosis not present

## 2020-09-06 DIAGNOSIS — E875 Hyperkalemia: Secondary | ICD-10-CM | POA: Diagnosis not present

## 2020-09-06 DIAGNOSIS — N2581 Secondary hyperparathyroidism of renal origin: Secondary | ICD-10-CM | POA: Diagnosis not present

## 2020-09-06 DIAGNOSIS — N186 End stage renal disease: Secondary | ICD-10-CM | POA: Diagnosis not present

## 2020-09-06 NOTE — Progress Notes (Signed)
Internal Medicine Clinic Attending  I spoke with the patient.  I personally confirmed the key portions of the history and exam documented by Dr. Wynetta Emery and I reviewed pertinent patient test results.  The assessment, diagnosis, and plan were formulated together and I agree with the documentation in the resident's note.

## 2020-09-08 DIAGNOSIS — Z992 Dependence on renal dialysis: Secondary | ICD-10-CM | POA: Diagnosis not present

## 2020-09-08 DIAGNOSIS — N186 End stage renal disease: Secondary | ICD-10-CM | POA: Diagnosis not present

## 2020-09-08 DIAGNOSIS — N2581 Secondary hyperparathyroidism of renal origin: Secondary | ICD-10-CM | POA: Diagnosis not present

## 2020-09-08 DIAGNOSIS — D509 Iron deficiency anemia, unspecified: Secondary | ICD-10-CM | POA: Diagnosis not present

## 2020-09-08 DIAGNOSIS — E875 Hyperkalemia: Secondary | ICD-10-CM | POA: Diagnosis not present

## 2020-09-11 DIAGNOSIS — D509 Iron deficiency anemia, unspecified: Secondary | ICD-10-CM | POA: Diagnosis not present

## 2020-09-11 DIAGNOSIS — Z992 Dependence on renal dialysis: Secondary | ICD-10-CM | POA: Diagnosis not present

## 2020-09-11 DIAGNOSIS — E875 Hyperkalemia: Secondary | ICD-10-CM | POA: Diagnosis not present

## 2020-09-11 DIAGNOSIS — N186 End stage renal disease: Secondary | ICD-10-CM | POA: Diagnosis not present

## 2020-09-11 DIAGNOSIS — N2581 Secondary hyperparathyroidism of renal origin: Secondary | ICD-10-CM | POA: Diagnosis not present

## 2020-09-13 DIAGNOSIS — E875 Hyperkalemia: Secondary | ICD-10-CM | POA: Diagnosis not present

## 2020-09-13 DIAGNOSIS — N2581 Secondary hyperparathyroidism of renal origin: Secondary | ICD-10-CM | POA: Diagnosis not present

## 2020-09-13 DIAGNOSIS — D509 Iron deficiency anemia, unspecified: Secondary | ICD-10-CM | POA: Diagnosis not present

## 2020-09-13 DIAGNOSIS — N186 End stage renal disease: Secondary | ICD-10-CM | POA: Diagnosis not present

## 2020-09-13 DIAGNOSIS — Z992 Dependence on renal dialysis: Secondary | ICD-10-CM | POA: Diagnosis not present

## 2020-09-15 DIAGNOSIS — Z992 Dependence on renal dialysis: Secondary | ICD-10-CM | POA: Diagnosis not present

## 2020-09-15 DIAGNOSIS — E875 Hyperkalemia: Secondary | ICD-10-CM | POA: Diagnosis not present

## 2020-09-15 DIAGNOSIS — N186 End stage renal disease: Secondary | ICD-10-CM | POA: Diagnosis not present

## 2020-09-15 DIAGNOSIS — D509 Iron deficiency anemia, unspecified: Secondary | ICD-10-CM | POA: Diagnosis not present

## 2020-09-15 DIAGNOSIS — Z23 Encounter for immunization: Secondary | ICD-10-CM | POA: Diagnosis not present

## 2020-09-15 DIAGNOSIS — N2581 Secondary hyperparathyroidism of renal origin: Secondary | ICD-10-CM | POA: Diagnosis not present

## 2020-09-15 DIAGNOSIS — I120 Hypertensive chronic kidney disease with stage 5 chronic kidney disease or end stage renal disease: Secondary | ICD-10-CM | POA: Diagnosis not present

## 2020-09-18 DIAGNOSIS — N186 End stage renal disease: Secondary | ICD-10-CM | POA: Diagnosis not present

## 2020-09-18 DIAGNOSIS — D509 Iron deficiency anemia, unspecified: Secondary | ICD-10-CM | POA: Diagnosis not present

## 2020-09-18 DIAGNOSIS — E875 Hyperkalemia: Secondary | ICD-10-CM | POA: Diagnosis not present

## 2020-09-18 DIAGNOSIS — N2581 Secondary hyperparathyroidism of renal origin: Secondary | ICD-10-CM | POA: Diagnosis not present

## 2020-09-18 DIAGNOSIS — Z992 Dependence on renal dialysis: Secondary | ICD-10-CM | POA: Diagnosis not present

## 2020-09-20 DIAGNOSIS — Z992 Dependence on renal dialysis: Secondary | ICD-10-CM | POA: Diagnosis not present

## 2020-09-20 DIAGNOSIS — N186 End stage renal disease: Secondary | ICD-10-CM | POA: Diagnosis not present

## 2020-09-20 DIAGNOSIS — E875 Hyperkalemia: Secondary | ICD-10-CM | POA: Diagnosis not present

## 2020-09-20 DIAGNOSIS — D509 Iron deficiency anemia, unspecified: Secondary | ICD-10-CM | POA: Diagnosis not present

## 2020-09-20 DIAGNOSIS — N2581 Secondary hyperparathyroidism of renal origin: Secondary | ICD-10-CM | POA: Diagnosis not present

## 2020-09-22 DIAGNOSIS — N186 End stage renal disease: Secondary | ICD-10-CM | POA: Diagnosis not present

## 2020-09-22 DIAGNOSIS — D509 Iron deficiency anemia, unspecified: Secondary | ICD-10-CM | POA: Diagnosis not present

## 2020-09-22 DIAGNOSIS — Z992 Dependence on renal dialysis: Secondary | ICD-10-CM | POA: Diagnosis not present

## 2020-09-22 DIAGNOSIS — N2581 Secondary hyperparathyroidism of renal origin: Secondary | ICD-10-CM | POA: Diagnosis not present

## 2020-09-22 DIAGNOSIS — E875 Hyperkalemia: Secondary | ICD-10-CM | POA: Diagnosis not present

## 2020-09-25 ENCOUNTER — Other Ambulatory Visit: Payer: Self-pay

## 2020-09-25 DIAGNOSIS — Z79891 Long term (current) use of opiate analgesic: Secondary | ICD-10-CM

## 2020-09-25 DIAGNOSIS — N186 End stage renal disease: Secondary | ICD-10-CM | POA: Diagnosis not present

## 2020-09-25 DIAGNOSIS — N2581 Secondary hyperparathyroidism of renal origin: Secondary | ICD-10-CM | POA: Diagnosis not present

## 2020-09-25 DIAGNOSIS — D509 Iron deficiency anemia, unspecified: Secondary | ICD-10-CM | POA: Diagnosis not present

## 2020-09-25 DIAGNOSIS — E875 Hyperkalemia: Secondary | ICD-10-CM | POA: Diagnosis not present

## 2020-09-25 DIAGNOSIS — Z992 Dependence on renal dialysis: Secondary | ICD-10-CM | POA: Diagnosis not present

## 2020-09-25 MED ORDER — OXYCODONE-ACETAMINOPHEN 10-325 MG PO TABS: 1.0000 | ORAL_TABLET | Freq: Three times a day (TID) | ORAL | 0 refills | Status: DC | PRN

## 2020-09-25 NOTE — Telephone Encounter (Signed)
Dronabinol #30 with 5 refills sent 07/18/2020. Will forward request for oxycodone. Hubbard Hartshorn, BSN, RN-BC

## 2020-09-25 NOTE — Telephone Encounter (Signed)
dronabinol (MARINOL) 5 MG capsule  oxyCODONE-acetaminophen (PERCOCET) 10-325 MG tablet, REFILL REQUEST @  [id number] - [place], [place] - [address] [place] Phone:  [phone]  Fax:  [phone]

## 2020-09-27 DIAGNOSIS — Z992 Dependence on renal dialysis: Secondary | ICD-10-CM | POA: Diagnosis not present

## 2020-09-27 DIAGNOSIS — N2581 Secondary hyperparathyroidism of renal origin: Secondary | ICD-10-CM | POA: Diagnosis not present

## 2020-09-27 DIAGNOSIS — N186 End stage renal disease: Secondary | ICD-10-CM | POA: Diagnosis not present

## 2020-09-27 DIAGNOSIS — D509 Iron deficiency anemia, unspecified: Secondary | ICD-10-CM | POA: Diagnosis not present

## 2020-09-27 DIAGNOSIS — E875 Hyperkalemia: Secondary | ICD-10-CM | POA: Diagnosis not present

## 2020-09-29 DIAGNOSIS — Z992 Dependence on renal dialysis: Secondary | ICD-10-CM | POA: Diagnosis not present

## 2020-09-29 DIAGNOSIS — D509 Iron deficiency anemia, unspecified: Secondary | ICD-10-CM | POA: Diagnosis not present

## 2020-09-29 DIAGNOSIS — N186 End stage renal disease: Secondary | ICD-10-CM | POA: Diagnosis not present

## 2020-09-29 DIAGNOSIS — N2581 Secondary hyperparathyroidism of renal origin: Secondary | ICD-10-CM | POA: Diagnosis not present

## 2020-09-29 DIAGNOSIS — E875 Hyperkalemia: Secondary | ICD-10-CM | POA: Diagnosis not present

## 2020-10-02 DIAGNOSIS — N2581 Secondary hyperparathyroidism of renal origin: Secondary | ICD-10-CM | POA: Diagnosis not present

## 2020-10-02 DIAGNOSIS — D509 Iron deficiency anemia, unspecified: Secondary | ICD-10-CM | POA: Diagnosis not present

## 2020-10-02 DIAGNOSIS — E875 Hyperkalemia: Secondary | ICD-10-CM | POA: Diagnosis not present

## 2020-10-02 DIAGNOSIS — Z992 Dependence on renal dialysis: Secondary | ICD-10-CM | POA: Diagnosis not present

## 2020-10-02 DIAGNOSIS — N186 End stage renal disease: Secondary | ICD-10-CM | POA: Diagnosis not present

## 2020-10-06 DIAGNOSIS — N2581 Secondary hyperparathyroidism of renal origin: Secondary | ICD-10-CM | POA: Diagnosis not present

## 2020-10-06 DIAGNOSIS — D509 Iron deficiency anemia, unspecified: Secondary | ICD-10-CM | POA: Diagnosis not present

## 2020-10-06 DIAGNOSIS — N186 End stage renal disease: Secondary | ICD-10-CM | POA: Diagnosis not present

## 2020-10-06 DIAGNOSIS — E875 Hyperkalemia: Secondary | ICD-10-CM | POA: Diagnosis not present

## 2020-10-06 DIAGNOSIS — Z992 Dependence on renal dialysis: Secondary | ICD-10-CM | POA: Diagnosis not present

## 2020-10-09 DIAGNOSIS — Z992 Dependence on renal dialysis: Secondary | ICD-10-CM | POA: Diagnosis not present

## 2020-10-09 DIAGNOSIS — E875 Hyperkalemia: Secondary | ICD-10-CM | POA: Diagnosis not present

## 2020-10-09 DIAGNOSIS — N2581 Secondary hyperparathyroidism of renal origin: Secondary | ICD-10-CM | POA: Diagnosis not present

## 2020-10-09 DIAGNOSIS — D509 Iron deficiency anemia, unspecified: Secondary | ICD-10-CM | POA: Diagnosis not present

## 2020-10-09 DIAGNOSIS — N186 End stage renal disease: Secondary | ICD-10-CM | POA: Diagnosis not present

## 2020-10-11 DIAGNOSIS — N186 End stage renal disease: Secondary | ICD-10-CM | POA: Diagnosis not present

## 2020-10-11 DIAGNOSIS — D509 Iron deficiency anemia, unspecified: Secondary | ICD-10-CM | POA: Diagnosis not present

## 2020-10-11 DIAGNOSIS — E875 Hyperkalemia: Secondary | ICD-10-CM | POA: Diagnosis not present

## 2020-10-11 DIAGNOSIS — N2581 Secondary hyperparathyroidism of renal origin: Secondary | ICD-10-CM | POA: Diagnosis not present

## 2020-10-11 DIAGNOSIS — Z992 Dependence on renal dialysis: Secondary | ICD-10-CM | POA: Diagnosis not present

## 2020-10-13 DIAGNOSIS — D509 Iron deficiency anemia, unspecified: Secondary | ICD-10-CM | POA: Diagnosis not present

## 2020-10-13 DIAGNOSIS — Z992 Dependence on renal dialysis: Secondary | ICD-10-CM | POA: Diagnosis not present

## 2020-10-13 DIAGNOSIS — E875 Hyperkalemia: Secondary | ICD-10-CM | POA: Diagnosis not present

## 2020-10-13 DIAGNOSIS — N2581 Secondary hyperparathyroidism of renal origin: Secondary | ICD-10-CM | POA: Diagnosis not present

## 2020-10-13 DIAGNOSIS — N186 End stage renal disease: Secondary | ICD-10-CM | POA: Diagnosis not present

## 2020-10-16 DIAGNOSIS — D509 Iron deficiency anemia, unspecified: Secondary | ICD-10-CM | POA: Diagnosis not present

## 2020-10-16 DIAGNOSIS — E875 Hyperkalemia: Secondary | ICD-10-CM | POA: Diagnosis not present

## 2020-10-16 DIAGNOSIS — Z992 Dependence on renal dialysis: Secondary | ICD-10-CM | POA: Diagnosis not present

## 2020-10-16 DIAGNOSIS — N186 End stage renal disease: Secondary | ICD-10-CM | POA: Diagnosis not present

## 2020-10-16 DIAGNOSIS — Z23 Encounter for immunization: Secondary | ICD-10-CM | POA: Diagnosis not present

## 2020-10-16 DIAGNOSIS — N2581 Secondary hyperparathyroidism of renal origin: Secondary | ICD-10-CM | POA: Diagnosis not present

## 2020-10-16 DIAGNOSIS — I120 Hypertensive chronic kidney disease with stage 5 chronic kidney disease or end stage renal disease: Secondary | ICD-10-CM | POA: Diagnosis not present

## 2020-10-18 DIAGNOSIS — D509 Iron deficiency anemia, unspecified: Secondary | ICD-10-CM | POA: Diagnosis not present

## 2020-10-18 DIAGNOSIS — E875 Hyperkalemia: Secondary | ICD-10-CM | POA: Diagnosis not present

## 2020-10-18 DIAGNOSIS — N186 End stage renal disease: Secondary | ICD-10-CM | POA: Diagnosis not present

## 2020-10-18 DIAGNOSIS — Z992 Dependence on renal dialysis: Secondary | ICD-10-CM | POA: Diagnosis not present

## 2020-10-18 DIAGNOSIS — N2581 Secondary hyperparathyroidism of renal origin: Secondary | ICD-10-CM | POA: Diagnosis not present

## 2020-10-19 ENCOUNTER — Ambulatory Visit (INDEPENDENT_AMBULATORY_CARE_PROVIDER_SITE_OTHER): Payer: Medicare Other | Admitting: Student

## 2020-10-19 ENCOUNTER — Ambulatory Visit (HOSPITAL_COMMUNITY)
Admission: RE | Admit: 2020-10-19 | Discharge: 2020-10-19 | Disposition: A | Discharge status: Home / Self Care | Payer: Medicare Other | Source: Ambulatory Visit | Attending: Internal Medicine | Admitting: Internal Medicine

## 2020-10-19 ENCOUNTER — Other Ambulatory Visit: Payer: Self-pay

## 2020-10-19 ENCOUNTER — Encounter: Payer: Self-pay | Admitting: Student

## 2020-10-19 VITALS — BP 125/100 | HR 113 | Temp 98.2°F | Ht 75.0 in | Wt 186.5 lb

## 2020-10-19 DIAGNOSIS — R058 Other specified cough: Secondary | ICD-10-CM

## 2020-10-19 DIAGNOSIS — R0609 Other forms of dyspnea: Secondary | ICD-10-CM | POA: Insufficient documentation

## 2020-10-19 DIAGNOSIS — I5022 Chronic systolic (congestive) heart failure: Secondary | ICD-10-CM

## 2020-10-19 DIAGNOSIS — J90 Pleural effusion, not elsewhere classified: Secondary | ICD-10-CM | POA: Diagnosis not present

## 2020-10-19 DIAGNOSIS — R0600 Dyspnea, unspecified: Secondary | ICD-10-CM | POA: Diagnosis not present

## 2020-10-19 DIAGNOSIS — Z9109 Other allergy status, other than to drugs and biological substances: Secondary | ICD-10-CM

## 2020-10-19 DIAGNOSIS — Q613 Polycystic kidney, unspecified: Secondary | ICD-10-CM | POA: Diagnosis not present

## 2020-10-19 DIAGNOSIS — Z79891 Long term (current) use of opiate analgesic: Secondary | ICD-10-CM | POA: Diagnosis not present

## 2020-10-19 DIAGNOSIS — B20 Human immunodeficiency virus [HIV] disease: Secondary | ICD-10-CM

## 2020-10-19 DIAGNOSIS — E46 Unspecified protein-calorie malnutrition: Secondary | ICD-10-CM

## 2020-10-19 DIAGNOSIS — R059 Cough, unspecified: Secondary | ICD-10-CM | POA: Diagnosis not present

## 2020-10-19 LAB — CBC WITH DIFFERENTIAL/PLATELET
Abs Immature Granulocytes: 0.01 10*3/uL (ref 0.00–0.07)
Basophils Absolute: 0 10*3/uL (ref 0.0–0.1)
Basophils Relative: 1 %
Eosinophils Absolute: 0.2 10*3/uL (ref 0.0–0.5)
Eosinophils Relative: 4 %
HCT: 36.7 % — ABNORMAL LOW (ref 39.0–52.0)
Hemoglobin: 12.4 g/dL — ABNORMAL LOW (ref 13.0–17.0)
Immature Granulocytes: 0 %
Lymphocytes Relative: 29 %
Lymphs Abs: 1.4 10*3/uL (ref 0.7–4.0)
MCH: 38.4 pg — ABNORMAL HIGH (ref 26.0–34.0)
MCHC: 33.8 g/dL (ref 30.0–36.0)
MCV: 113.6 fL — ABNORMAL HIGH (ref 80.0–100.0)
Monocytes Absolute: 0.6 10*3/uL (ref 0.1–1.0)
Monocytes Relative: 14 %
Neutro Abs: 2.4 10*3/uL (ref 1.7–7.7)
Neutrophils Relative %: 52 %
Platelets: 155 10*3/uL (ref 150–400)
RBC: 3.23 MIL/uL — ABNORMAL LOW (ref 4.22–5.81)
RDW: 16.8 % — ABNORMAL HIGH (ref 11.5–15.5)
WBC: 4.7 10*3/uL (ref 4.0–10.5)
nRBC: 0 % (ref 0.0–0.2)

## 2020-10-19 LAB — BRAIN NATRIURETIC PEPTIDE: B Natriuretic Peptide: 488.1 pg/mL — ABNORMAL HIGH (ref 0.0–100.0)

## 2020-10-19 MED ORDER — DRONABINOL 5 MG PO CAPS
5.0000 mg | ORAL_CAPSULE | Freq: Every day | ORAL | 2 refills | Status: DC
Start: 2020-10-19 — End: 2021-01-18

## 2020-10-19 MED ORDER — OXYCODONE-ACETAMINOPHEN 10-325 MG PO TABS: 1.0000 | ORAL_TABLET | Freq: Three times a day (TID) | ORAL | 0 refills | Status: DC | PRN

## 2020-10-19 MED ORDER — FLUTICASONE PROPIONATE 50 MCG/ACT NA SUSP: 2.0000 | Freq: Every day | NASAL | 11 refills | Status: AC

## 2020-10-19 MED ORDER — GUAIFENESIN ER 600 MG PO TB12: 600.0000 mg | ORAL_TABLET | Freq: Two times a day (BID) | ORAL | 0 refills | Status: DC | PRN

## 2020-10-19 MED ORDER — ALBUTEROL SULFATE HFA 108 (90 BASE) MCG/ACT IN AERS
INHALATION_SPRAY | RESPIRATORY_TRACT | 6 refills | Status: DC
Start: 2020-10-19 — End: 2021-01-26

## 2020-10-19 NOTE — Assessment & Plan Note (Signed)
Patient with history of chronic systolic heart failure (EF 20-25%), COPD, and recent COVID-19 infection 2.5 months ago. He reports that for a month after his hospitalization for COVID-19, his symptoms were improving. However, for the past month, his cough has significantly increased. He reports that he produces yellow, orange, and red sputum when he coughs. States that his cough is worse when lays down at night and it has been interfering with his sleep. Also reports that he has been having progressively worsening dyspnea on exertion. States that even walking around his house is very tiring and he has to take breaks when going from one place to another. Does report that his COPD inhalers help a little bit. Also endorses decreased energy to the point that he does not even feel like cooking food or doing anything. On exam, there is diminished breath sounds at his bases bilaterally. No adventitious sounds or dullness to percussion. At this point, unsure if this is from post-COVID sequelae (viral PNA) vs COPD exacerbation (increased cough, sputum production, dyspnea) vs CHF exacerbation (worse when laying flat, hx of HF with severely reduced EF). Ordered CBC, BNP, CXR. -f/u labs and CXR -prescribed guaifenesin to help with cough and loosen mucus -refilled COPD inhaler -f/u visit in 2 weeks

## 2020-10-19 NOTE — Assessment & Plan Note (Signed)
Doing well on tivicay and descovy. Last HIV RNA quant on 08/26/20 was once again undetectable. -continue current medications
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
## 2020-10-19 NOTE — Patient Instructions (Signed)
Brian [name],  It was a pleasure seeing you in the clinic today.   As we discussed, we will be getting a chest x-ray today and some blood work. I will give you a call with the results.  I have refilled the medications you requested, including your inhaler. I have also sent a prescription for Guaifenesin, which is a medication to help with your cough and to loosen the mucus in your throat. Please take as directed.  Please call our clinic at [phone] if you have any questions or concerns. The best time to call is Monday-Friday from 9am-4pm, but there is someone available 24/7 at the same number. If you need medication refills, please notify your pharmacy one week in advance and they will send [place] a request.   Thank you for letting us take part in your care. We look forward to seeing you next time!
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
## 2020-10-19 NOTE — Assessment & Plan Note (Signed)
Patient with history of PCKD with chronic abdominal pain. On percocets for pain. Reviewed controlled substance database and findings are as expected and appropriate. Will refill percocets for pain management.

## 2020-10-20 DIAGNOSIS — N2581 Secondary hyperparathyroidism of renal origin: Secondary | ICD-10-CM | POA: Diagnosis not present

## 2020-10-20 DIAGNOSIS — Z992 Dependence on renal dialysis: Secondary | ICD-10-CM | POA: Diagnosis not present

## 2020-10-20 DIAGNOSIS — E875 Hyperkalemia: Secondary | ICD-10-CM | POA: Diagnosis not present

## 2020-10-20 DIAGNOSIS — N186 End stage renal disease: Secondary | ICD-10-CM | POA: Diagnosis not present

## 2020-10-20 DIAGNOSIS — D509 Iron deficiency anemia, unspecified: Secondary | ICD-10-CM | POA: Diagnosis not present

## 2020-10-20 NOTE — Assessment & Plan Note (Signed)
Patient with ECHO in 04/2020 still showing EF 20-25%. He is not on any disease modifying medications mainly because antihypertensives cause for him to become hypotensive with dialysis. He is currently compensated. Has been referred to cardiology in the past but no solution noted. Will continue to monitor this and will perform more extensive chart review to see if there is anything we can offer.

## 2020-10-23 DIAGNOSIS — N186 End stage renal disease: Secondary | ICD-10-CM | POA: Diagnosis not present

## 2020-10-23 DIAGNOSIS — E875 Hyperkalemia: Secondary | ICD-10-CM | POA: Diagnosis not present

## 2020-10-23 DIAGNOSIS — Z992 Dependence on renal dialysis: Secondary | ICD-10-CM | POA: Diagnosis not present

## 2020-10-23 DIAGNOSIS — N2581 Secondary hyperparathyroidism of renal origin: Secondary | ICD-10-CM | POA: Diagnosis not present

## 2020-10-23 DIAGNOSIS — D509 Iron deficiency anemia, unspecified: Secondary | ICD-10-CM | POA: Diagnosis not present

## 2020-10-23 NOTE — Progress Notes (Signed)
Internal Medicine Clinic Attending  I saw and evaluated the patient.  I personally confirmed the key portions of the history and exam documented by Dr. Jinwala and I reviewed pertinent patient test results.  The assessment, diagnosis, and plan were formulated together and I agree with the documentation in the resident's note.  

## 2020-10-23 NOTE — Progress Notes (Signed)
Patient called.  Left message for patient to call back. Normal chest x-ray findings.

## 2020-10-25 DIAGNOSIS — Z992 Dependence on renal dialysis: Secondary | ICD-10-CM | POA: Diagnosis not present

## 2020-10-25 DIAGNOSIS — N186 End stage renal disease: Secondary | ICD-10-CM | POA: Diagnosis not present

## 2020-10-25 DIAGNOSIS — N2581 Secondary hyperparathyroidism of renal origin: Secondary | ICD-10-CM | POA: Diagnosis not present

## 2020-10-25 DIAGNOSIS — E875 Hyperkalemia: Secondary | ICD-10-CM | POA: Diagnosis not present

## 2020-10-25 DIAGNOSIS — D509 Iron deficiency anemia, unspecified: Secondary | ICD-10-CM | POA: Diagnosis not present

## 2020-10-27 DIAGNOSIS — D509 Iron deficiency anemia, unspecified: Secondary | ICD-10-CM | POA: Diagnosis not present

## 2020-10-27 DIAGNOSIS — N2581 Secondary hyperparathyroidism of renal origin: Secondary | ICD-10-CM | POA: Diagnosis not present

## 2020-10-27 DIAGNOSIS — Z992 Dependence on renal dialysis: Secondary | ICD-10-CM | POA: Diagnosis not present

## 2020-10-27 DIAGNOSIS — E875 Hyperkalemia: Secondary | ICD-10-CM | POA: Diagnosis not present

## 2020-10-27 DIAGNOSIS — N186 End stage renal disease: Secondary | ICD-10-CM | POA: Diagnosis not present

## 2020-10-30 DIAGNOSIS — D509 Iron deficiency anemia, unspecified: Secondary | ICD-10-CM | POA: Diagnosis not present

## 2020-10-30 DIAGNOSIS — N2581 Secondary hyperparathyroidism of renal origin: Secondary | ICD-10-CM | POA: Diagnosis not present

## 2020-10-30 DIAGNOSIS — E875 Hyperkalemia: Secondary | ICD-10-CM | POA: Diagnosis not present

## 2020-10-30 DIAGNOSIS — Z992 Dependence on renal dialysis: Secondary | ICD-10-CM | POA: Diagnosis not present

## 2020-10-30 DIAGNOSIS — N186 End stage renal disease: Secondary | ICD-10-CM | POA: Diagnosis not present

## 2020-11-01 DIAGNOSIS — Z992 Dependence on renal dialysis: Secondary | ICD-10-CM | POA: Diagnosis not present

## 2020-11-01 DIAGNOSIS — D509 Iron deficiency anemia, unspecified: Secondary | ICD-10-CM | POA: Diagnosis not present

## 2020-11-01 DIAGNOSIS — N186 End stage renal disease: Secondary | ICD-10-CM | POA: Diagnosis not present

## 2020-11-01 DIAGNOSIS — E875 Hyperkalemia: Secondary | ICD-10-CM | POA: Diagnosis not present

## 2020-11-01 DIAGNOSIS — N2581 Secondary hyperparathyroidism of renal origin: Secondary | ICD-10-CM | POA: Diagnosis not present

## 2020-11-05 DIAGNOSIS — D509 Iron deficiency anemia, unspecified: Secondary | ICD-10-CM | POA: Diagnosis not present

## 2020-11-05 DIAGNOSIS — Z992 Dependence on renal dialysis: Secondary | ICD-10-CM | POA: Diagnosis not present

## 2020-11-05 DIAGNOSIS — E875 Hyperkalemia: Secondary | ICD-10-CM | POA: Diagnosis not present

## 2020-11-05 DIAGNOSIS — N2581 Secondary hyperparathyroidism of renal origin: Secondary | ICD-10-CM | POA: Diagnosis not present

## 2020-11-05 DIAGNOSIS — N186 End stage renal disease: Secondary | ICD-10-CM | POA: Diagnosis not present

## 2020-11-07 DIAGNOSIS — Z992 Dependence on renal dialysis: Secondary | ICD-10-CM | POA: Diagnosis not present

## 2020-11-07 DIAGNOSIS — N2581 Secondary hyperparathyroidism of renal origin: Secondary | ICD-10-CM | POA: Diagnosis not present

## 2020-11-07 DIAGNOSIS — N186 End stage renal disease: Secondary | ICD-10-CM | POA: Diagnosis not present

## 2020-11-07 DIAGNOSIS — E875 Hyperkalemia: Secondary | ICD-10-CM | POA: Diagnosis not present

## 2020-11-07 DIAGNOSIS — D509 Iron deficiency anemia, unspecified: Secondary | ICD-10-CM | POA: Diagnosis not present

## 2020-11-10 DIAGNOSIS — N186 End stage renal disease: Secondary | ICD-10-CM | POA: Diagnosis not present

## 2020-11-10 DIAGNOSIS — E875 Hyperkalemia: Secondary | ICD-10-CM | POA: Diagnosis not present

## 2020-11-10 DIAGNOSIS — Z992 Dependence on renal dialysis: Secondary | ICD-10-CM | POA: Diagnosis not present

## 2020-11-10 DIAGNOSIS — D509 Iron deficiency anemia, unspecified: Secondary | ICD-10-CM | POA: Diagnosis not present

## 2020-11-10 DIAGNOSIS — N2581 Secondary hyperparathyroidism of renal origin: Secondary | ICD-10-CM | POA: Diagnosis not present

## 2020-11-13 DIAGNOSIS — Z992 Dependence on renal dialysis: Secondary | ICD-10-CM | POA: Diagnosis not present

## 2020-11-13 DIAGNOSIS — E875 Hyperkalemia: Secondary | ICD-10-CM | POA: Diagnosis not present

## 2020-11-13 DIAGNOSIS — D509 Iron deficiency anemia, unspecified: Secondary | ICD-10-CM | POA: Diagnosis not present

## 2020-11-13 DIAGNOSIS — N2581 Secondary hyperparathyroidism of renal origin: Secondary | ICD-10-CM | POA: Diagnosis not present

## 2020-11-13 DIAGNOSIS — N186 End stage renal disease: Secondary | ICD-10-CM | POA: Diagnosis not present

## 2020-11-15 DIAGNOSIS — Z992 Dependence on renal dialysis: Secondary | ICD-10-CM | POA: Diagnosis not present

## 2020-11-15 DIAGNOSIS — N186 End stage renal disease: Secondary | ICD-10-CM | POA: Diagnosis not present

## 2020-11-15 DIAGNOSIS — N2581 Secondary hyperparathyroidism of renal origin: Secondary | ICD-10-CM | POA: Diagnosis not present

## 2020-11-15 DIAGNOSIS — E875 Hyperkalemia: Secondary | ICD-10-CM | POA: Diagnosis not present

## 2020-11-15 DIAGNOSIS — I120 Hypertensive chronic kidney disease with stage 5 chronic kidney disease or end stage renal disease: Secondary | ICD-10-CM | POA: Diagnosis not present

## 2020-11-16 ENCOUNTER — Other Ambulatory Visit: Payer: Self-pay

## 2020-11-16 DIAGNOSIS — Z79891 Long term (current) use of opiate analgesic: Secondary | ICD-10-CM

## 2020-11-16 DIAGNOSIS — G2581 Restless legs syndrome: Secondary | ICD-10-CM

## 2020-11-16 DIAGNOSIS — Q613 Polycystic kidney, unspecified: Secondary | ICD-10-CM

## 2020-11-16 MED ORDER — OXYCODONE-ACETAMINOPHEN 10-325 MG PO TABS: 1.0000 | ORAL_TABLET | Freq: Three times a day (TID) | ORAL | 0 refills | Status: DC | PRN

## 2020-11-16 MED ORDER — ROPINIROLE HCL 2 MG PO TABS: 2.0000 mg | ORAL_TABLET | Freq: Every day | ORAL | 3 refills | Status: DC
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
## 2020-11-16 NOTE — Telephone Encounter (Signed)
rOPINIRole (REQUIP) 2 MG tablet(Expired  oxyCODONE-acetaminophen (PERCOCET) 10-325 MG tablet, REFILL REQUEST @  [id number] - [place], [place] - [address] Phone:  [phone]  Fax:  [phone]

## 2020-11-17 DIAGNOSIS — Z992 Dependence on renal dialysis: Secondary | ICD-10-CM | POA: Diagnosis not present

## 2020-11-17 DIAGNOSIS — E875 Hyperkalemia: Secondary | ICD-10-CM | POA: Diagnosis not present

## 2020-11-17 DIAGNOSIS — N2581 Secondary hyperparathyroidism of renal origin: Secondary | ICD-10-CM | POA: Diagnosis not present

## 2020-11-17 DIAGNOSIS — N186 End stage renal disease: Secondary | ICD-10-CM | POA: Diagnosis not present

## 2020-11-20 DIAGNOSIS — N5201 Erectile dysfunction due to arterial insufficiency: Secondary | ICD-10-CM | POA: Diagnosis not present

## 2020-11-20 DIAGNOSIS — E875 Hyperkalemia: Secondary | ICD-10-CM | POA: Diagnosis not present

## 2020-11-20 DIAGNOSIS — N186 End stage renal disease: Secondary | ICD-10-CM | POA: Diagnosis not present

## 2020-11-20 DIAGNOSIS — Z992 Dependence on renal dialysis: Secondary | ICD-10-CM | POA: Diagnosis not present

## 2020-11-20 DIAGNOSIS — N2581 Secondary hyperparathyroidism of renal origin: Secondary | ICD-10-CM | POA: Diagnosis not present

## 2020-11-21 ENCOUNTER — Telehealth: Payer: Self-pay | Admitting: *Deleted

## 2020-11-21 ENCOUNTER — Encounter: Payer: Medicare Other | Admitting: Internal Medicine

## 2020-11-21 NOTE — Telephone Encounter (Signed)
Call to patient about missed appointment.  Message left on voicemail to call the Clinics to reschedule if needed.  Sander Nephew, RN 11/21/2020 2:53 AM.
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
## 2020-11-22 DIAGNOSIS — N2581 Secondary hyperparathyroidism of renal origin: Secondary | ICD-10-CM | POA: Diagnosis not present

## 2020-11-22 DIAGNOSIS — N186 End stage renal disease: Secondary | ICD-10-CM | POA: Diagnosis not present

## 2020-11-22 DIAGNOSIS — Z992 Dependence on renal dialysis: Secondary | ICD-10-CM | POA: Diagnosis not present

## 2020-11-22 DIAGNOSIS — E875 Hyperkalemia: Secondary | ICD-10-CM | POA: Diagnosis not present

## 2020-11-23 ENCOUNTER — Ambulatory Visit (INDEPENDENT_AMBULATORY_CARE_PROVIDER_SITE_OTHER): Payer: Medicare Other | Admitting: Internal Medicine

## 2020-11-23 VITALS — BP 92/58 | HR 106 | Temp 97.3°F | Wt 182.6 lb

## 2020-11-23 DIAGNOSIS — R0609 Other forms of dyspnea: Secondary | ICD-10-CM

## 2020-11-23 DIAGNOSIS — R0600 Dyspnea, unspecified: Secondary | ICD-10-CM

## 2020-11-23 DIAGNOSIS — Z720 Tobacco use: Secondary | ICD-10-CM | POA: Diagnosis not present

## 2020-11-23 DIAGNOSIS — N186 End stage renal disease: Secondary | ICD-10-CM | POA: Diagnosis not present

## 2020-11-23 DIAGNOSIS — U071 COVID-19: Secondary | ICD-10-CM | POA: Diagnosis not present

## 2020-11-23 DIAGNOSIS — I5022 Chronic systolic (congestive) heart failure: Secondary | ICD-10-CM

## 2020-11-23 DIAGNOSIS — U099 Post covid-19 condition, unspecified: Secondary | ICD-10-CM | POA: Diagnosis not present

## 2020-11-23 DIAGNOSIS — I959 Hypotension, unspecified: Secondary | ICD-10-CM | POA: Diagnosis not present

## 2020-11-23 MED ORDER — MIDODRINE HCL 5 MG PO TABS: 5.0000 mg | ORAL_TABLET | Freq: Three times a day (TID) | ORAL | 2 refills | Status: DC
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
## 2020-11-23 NOTE — Patient Instructions (Signed)
Thank you for allowing [place] to provide your care today. Today we discussed your low blood pressure, heart failure, and shortness of breath.     I have ordered the following labs for you:    I will call if any are abnormal.    Today we made the following changes to your medications:   Please START taking  Midodrine 5 mg - take one tablet prior to dialysis    Please make sure to follow-up with cardiology: [place]  [phone]    I have ordered pulmonary function tests. Please follow-up once you have completed these.   Please call the internal medicine center clinic if you have any questions or concerns, we may be able to help and keep you from a long and expensive emergency room wait. Our clinic and after hours phone number is [phone], the best time to call is Monday through Friday 9 am to 4 pm but there is always someone available 24/7 if you have an emergency. If you need medication refills please notify your pharmacy one week in advance and they will send [place] a request.

## 2020-11-24 ENCOUNTER — Encounter: Payer: Self-pay | Admitting: Internal Medicine

## 2020-11-24 DIAGNOSIS — E875 Hyperkalemia: Secondary | ICD-10-CM | POA: Diagnosis not present

## 2020-11-24 DIAGNOSIS — N2581 Secondary hyperparathyroidism of renal origin: Secondary | ICD-10-CM | POA: Diagnosis not present

## 2020-11-24 DIAGNOSIS — Z992 Dependence on renal dialysis: Secondary | ICD-10-CM | POA: Diagnosis not present

## 2020-11-24 DIAGNOSIS — N186 End stage renal disease: Secondary | ICD-10-CM | POA: Diagnosis not present

## 2020-11-24 NOTE — Assessment & Plan Note (Signed)
Covid earlier this year. Has had dyspnea on exertion since covid, making it difficult to get around and do things around the house. He smoked for many years but quit just prior to getting covid. Has an albuterol inhaler which helps slightly.  Last seen one month ago with cxr and labs, cxr showed enlarged lung volumes.   - order PFTs - cont. Albuterol inhaler

## 2020-11-24 NOTE — Assessment & Plan Note (Signed)
Blood pressure has been low for years. He has always been asymptomatic. He does have dyspnea with exertion that has been worse since he had covid several months ago. He has hemodialysis MWF and normally this is not an issue but recently their rules change and he cannot complete dialysis if his systolic blood pressure drops below 100. For this reason he was unable to complete the proper UF at his last session.   - start midodrine 5 mg three times per week with HD - f/u with nephrology

## 2020-11-24 NOTE — Assessment & Plan Note (Signed)
Previously followed by Dr. Meda Coffee in 2015. Myoview done in 2015 showed inferolateral scarring and EF of 35% secondary to likely prior MI. He was started on lisinopril low dose but this was stopped due to symptomatic hypotension with HD. Repeat echo showed EF of 25%, beta blocker was to be started but it is unclear if he was taking this, I do not see any follow-up cardiology notes. He was referred back to cardiology several times but appears to never have made appointment, referral was cancelled for no-show.  Echo in 2019 showed EF of 35% with moderate mitral regurge. Again seen by cardiology 12/2019 with recommendations not to start Acei unless determined by nephrology. Had plans for repeat echo with f/u after if any changes or in one year. His echo was not done until 04/2020 and showed EF of 25% with severe mitral regurge.  He has SOB with exertion which has been present since covid earlier this year. He denies chest pain, palpitations, weakness or dizziness.  He is compensated on exam, no LE edema and good perfusion in upper and lower extremities.  Patient states he is unaware he had heart failure. We had a long discussion today about what this meant and entailed and the importance of following up with his heart doctors.    - f/u with cardiology after echo as planned

## 2020-11-24 NOTE — Assessment & Plan Note (Deleted)
Covid earlier this year. Has had dyspnea on exertion since covid, making it difficult to get around and do things around the house. He smoked for many years but quit just prior to getting covid. Has an albuterol inhaler which helps slightly.   - order PFTs

## 2020-11-27 DIAGNOSIS — N186 End stage renal disease: Secondary | ICD-10-CM | POA: Diagnosis not present

## 2020-11-27 DIAGNOSIS — Z992 Dependence on renal dialysis: Secondary | ICD-10-CM | POA: Diagnosis not present

## 2020-11-27 DIAGNOSIS — E875 Hyperkalemia: Secondary | ICD-10-CM | POA: Diagnosis not present

## 2020-11-27 DIAGNOSIS — N2581 Secondary hyperparathyroidism of renal origin: Secondary | ICD-10-CM | POA: Diagnosis not present

## 2020-11-27 NOTE — Progress Notes (Signed)
Internal Medicine Clinic Attending  Case discussed with Dr. Seawell  At the time of the visit.  We reviewed the resident's history and exam and pertinent patient test results.  I agree with the assessment, diagnosis, and plan of care documented in the resident's note.  

## 2020-11-29 ENCOUNTER — Encounter: Payer: Self-pay | Admitting: *Deleted

## 2020-11-29 DIAGNOSIS — N186 End stage renal disease: Secondary | ICD-10-CM | POA: Diagnosis not present

## 2020-11-29 DIAGNOSIS — Z992 Dependence on renal dialysis: Secondary | ICD-10-CM | POA: Diagnosis not present

## 2020-11-29 DIAGNOSIS — E875 Hyperkalemia: Secondary | ICD-10-CM | POA: Diagnosis not present

## 2020-11-29 DIAGNOSIS — N2581 Secondary hyperparathyroidism of renal origin: Secondary | ICD-10-CM | POA: Diagnosis not present

## 2020-12-01 DIAGNOSIS — N186 End stage renal disease: Secondary | ICD-10-CM | POA: Diagnosis not present

## 2020-12-01 DIAGNOSIS — N2581 Secondary hyperparathyroidism of renal origin: Secondary | ICD-10-CM | POA: Diagnosis not present

## 2020-12-01 DIAGNOSIS — E875 Hyperkalemia: Secondary | ICD-10-CM | POA: Diagnosis not present

## 2020-12-01 DIAGNOSIS — Z992 Dependence on renal dialysis: Secondary | ICD-10-CM | POA: Diagnosis not present

## 2020-12-04 ENCOUNTER — Encounter: Payer: Self-pay | Admitting: General Practice

## 2020-12-04 DIAGNOSIS — N186 End stage renal disease: Secondary | ICD-10-CM | POA: Diagnosis not present

## 2020-12-04 DIAGNOSIS — Z992 Dependence on renal dialysis: Secondary | ICD-10-CM | POA: Diagnosis not present

## 2020-12-04 DIAGNOSIS — N2581 Secondary hyperparathyroidism of renal origin: Secondary | ICD-10-CM | POA: Diagnosis not present

## 2020-12-04 DIAGNOSIS — E875 Hyperkalemia: Secondary | ICD-10-CM | POA: Diagnosis not present

## 2020-12-06 DIAGNOSIS — E875 Hyperkalemia: Secondary | ICD-10-CM | POA: Diagnosis not present

## 2020-12-06 DIAGNOSIS — N186 End stage renal disease: Secondary | ICD-10-CM | POA: Diagnosis not present

## 2020-12-06 DIAGNOSIS — N2581 Secondary hyperparathyroidism of renal origin: Secondary | ICD-10-CM | POA: Diagnosis not present

## 2020-12-06 DIAGNOSIS — Z992 Dependence on renal dialysis: Secondary | ICD-10-CM | POA: Diagnosis not present

## 2020-12-10 DIAGNOSIS — N2581 Secondary hyperparathyroidism of renal origin: Secondary | ICD-10-CM | POA: Diagnosis not present

## 2020-12-10 DIAGNOSIS — Z992 Dependence on renal dialysis: Secondary | ICD-10-CM | POA: Diagnosis not present

## 2020-12-10 DIAGNOSIS — N186 End stage renal disease: Secondary | ICD-10-CM | POA: Diagnosis not present

## 2020-12-10 DIAGNOSIS — E875 Hyperkalemia: Secondary | ICD-10-CM | POA: Diagnosis not present

## 2020-12-11 DIAGNOSIS — N2581 Secondary hyperparathyroidism of renal origin: Secondary | ICD-10-CM | POA: Diagnosis not present

## 2020-12-11 DIAGNOSIS — N186 End stage renal disease: Secondary | ICD-10-CM | POA: Diagnosis not present

## 2020-12-11 DIAGNOSIS — E875 Hyperkalemia: Secondary | ICD-10-CM | POA: Diagnosis not present

## 2020-12-11 DIAGNOSIS — Z992 Dependence on renal dialysis: Secondary | ICD-10-CM | POA: Diagnosis not present

## 2020-12-13 DIAGNOSIS — N2581 Secondary hyperparathyroidism of renal origin: Secondary | ICD-10-CM | POA: Diagnosis not present

## 2020-12-13 DIAGNOSIS — Z992 Dependence on renal dialysis: Secondary | ICD-10-CM | POA: Diagnosis not present

## 2020-12-13 DIAGNOSIS — E875 Hyperkalemia: Secondary | ICD-10-CM | POA: Diagnosis not present

## 2020-12-13 DIAGNOSIS — N186 End stage renal disease: Secondary | ICD-10-CM | POA: Diagnosis not present

## 2020-12-15 DIAGNOSIS — N186 End stage renal disease: Secondary | ICD-10-CM | POA: Diagnosis not present

## 2020-12-15 DIAGNOSIS — E875 Hyperkalemia: Secondary | ICD-10-CM | POA: Diagnosis not present

## 2020-12-15 DIAGNOSIS — Z992 Dependence on renal dialysis: Secondary | ICD-10-CM | POA: Diagnosis not present

## 2020-12-15 DIAGNOSIS — N2581 Secondary hyperparathyroidism of renal origin: Secondary | ICD-10-CM | POA: Diagnosis not present

## 2020-12-16 DIAGNOSIS — Z992 Dependence on renal dialysis: Secondary | ICD-10-CM | POA: Diagnosis not present

## 2020-12-16 DIAGNOSIS — I120 Hypertensive chronic kidney disease with stage 5 chronic kidney disease or end stage renal disease: Secondary | ICD-10-CM | POA: Diagnosis not present

## 2020-12-16 DIAGNOSIS — N186 End stage renal disease: Secondary | ICD-10-CM | POA: Diagnosis not present

## 2020-12-18 ENCOUNTER — Other Ambulatory Visit: Payer: Self-pay

## 2020-12-18 DIAGNOSIS — N186 End stage renal disease: Secondary | ICD-10-CM | POA: Diagnosis not present

## 2020-12-18 DIAGNOSIS — D631 Anemia in chronic kidney disease: Secondary | ICD-10-CM | POA: Diagnosis not present

## 2020-12-18 DIAGNOSIS — Z992 Dependence on renal dialysis: Secondary | ICD-10-CM | POA: Diagnosis not present

## 2020-12-18 DIAGNOSIS — D509 Iron deficiency anemia, unspecified: Secondary | ICD-10-CM | POA: Diagnosis not present

## 2020-12-18 DIAGNOSIS — E875 Hyperkalemia: Secondary | ICD-10-CM | POA: Diagnosis not present

## 2020-12-18 DIAGNOSIS — Z79891 Long term (current) use of opiate analgesic: Secondary | ICD-10-CM

## 2020-12-18 DIAGNOSIS — N2581 Secondary hyperparathyroidism of renal origin: Secondary | ICD-10-CM | POA: Diagnosis not present

## 2020-12-18 NOTE — Telephone Encounter (Signed)
  Menthol (RICOLA W/ECHINACEA) 3.5 MG LOZG   oxyCODONE-acetaminophen (PERCOCET) 10-325 MG tablet, REFILL REQUEST @  [facility] #[id number] - [place], [place] - [address] Phone:  [phone]  Fax:  [phone]

## 2020-12-19 MED ORDER — RICOLA W/ECHINACEA 3.5 MG MT LOZG
1.0000 | LOZENGE | OROMUCOSAL | 1 refills | Status: DC | PRN
Start: 2020-12-19 — End: 2021-07-17

## 2020-12-19 MED ORDER — OXYCODONE-ACETAMINOPHEN 10-325 MG PO TABS: 1.0000 | ORAL_TABLET | Freq: Three times a day (TID) | ORAL | 0 refills | Status: DC | PRN

## 2020-12-20 DIAGNOSIS — N2581 Secondary hyperparathyroidism of renal origin: Secondary | ICD-10-CM | POA: Diagnosis not present

## 2020-12-20 DIAGNOSIS — D509 Iron deficiency anemia, unspecified: Secondary | ICD-10-CM | POA: Diagnosis not present

## 2020-12-20 DIAGNOSIS — D631 Anemia in chronic kidney disease: Secondary | ICD-10-CM | POA: Diagnosis not present

## 2020-12-20 DIAGNOSIS — N186 End stage renal disease: Secondary | ICD-10-CM | POA: Diagnosis not present

## 2020-12-20 DIAGNOSIS — Z992 Dependence on renal dialysis: Secondary | ICD-10-CM | POA: Diagnosis not present

## 2020-12-20 DIAGNOSIS — E875 Hyperkalemia: Secondary | ICD-10-CM | POA: Diagnosis not present

## 2020-12-22 DIAGNOSIS — N186 End stage renal disease: Secondary | ICD-10-CM | POA: Diagnosis not present

## 2020-12-22 DIAGNOSIS — N2581 Secondary hyperparathyroidism of renal origin: Secondary | ICD-10-CM | POA: Diagnosis not present

## 2020-12-22 DIAGNOSIS — Z992 Dependence on renal dialysis: Secondary | ICD-10-CM | POA: Diagnosis not present

## 2020-12-22 DIAGNOSIS — E875 Hyperkalemia: Secondary | ICD-10-CM | POA: Diagnosis not present

## 2020-12-22 DIAGNOSIS — D631 Anemia in chronic kidney disease: Secondary | ICD-10-CM | POA: Diagnosis not present

## 2020-12-22 DIAGNOSIS — D509 Iron deficiency anemia, unspecified: Secondary | ICD-10-CM | POA: Diagnosis not present

## 2020-12-25 DIAGNOSIS — N2581 Secondary hyperparathyroidism of renal origin: Secondary | ICD-10-CM | POA: Diagnosis not present

## 2020-12-25 DIAGNOSIS — E875 Hyperkalemia: Secondary | ICD-10-CM | POA: Diagnosis not present

## 2020-12-25 DIAGNOSIS — D631 Anemia in chronic kidney disease: Secondary | ICD-10-CM | POA: Diagnosis not present

## 2020-12-25 DIAGNOSIS — N186 End stage renal disease: Secondary | ICD-10-CM | POA: Diagnosis not present

## 2020-12-25 DIAGNOSIS — D509 Iron deficiency anemia, unspecified: Secondary | ICD-10-CM | POA: Diagnosis not present

## 2020-12-25 DIAGNOSIS — Z992 Dependence on renal dialysis: Secondary | ICD-10-CM | POA: Diagnosis not present

## 2020-12-27 DIAGNOSIS — N2581 Secondary hyperparathyroidism of renal origin: Secondary | ICD-10-CM | POA: Diagnosis not present

## 2020-12-27 DIAGNOSIS — Z992 Dependence on renal dialysis: Secondary | ICD-10-CM | POA: Diagnosis not present

## 2020-12-27 DIAGNOSIS — E875 Hyperkalemia: Secondary | ICD-10-CM | POA: Diagnosis not present

## 2020-12-27 DIAGNOSIS — D631 Anemia in chronic kidney disease: Secondary | ICD-10-CM | POA: Diagnosis not present

## 2020-12-27 DIAGNOSIS — D509 Iron deficiency anemia, unspecified: Secondary | ICD-10-CM | POA: Diagnosis not present

## 2020-12-27 DIAGNOSIS — N186 End stage renal disease: Secondary | ICD-10-CM | POA: Diagnosis not present

## 2020-12-29 DIAGNOSIS — N186 End stage renal disease: Secondary | ICD-10-CM | POA: Diagnosis not present

## 2020-12-29 DIAGNOSIS — D631 Anemia in chronic kidney disease: Secondary | ICD-10-CM | POA: Diagnosis not present

## 2020-12-29 DIAGNOSIS — N2581 Secondary hyperparathyroidism of renal origin: Secondary | ICD-10-CM | POA: Diagnosis not present

## 2020-12-29 DIAGNOSIS — Z992 Dependence on renal dialysis: Secondary | ICD-10-CM | POA: Diagnosis not present

## 2020-12-29 DIAGNOSIS — D509 Iron deficiency anemia, unspecified: Secondary | ICD-10-CM | POA: Diagnosis not present

## 2020-12-29 DIAGNOSIS — E875 Hyperkalemia: Secondary | ICD-10-CM | POA: Diagnosis not present

## 2020-12-29 NOTE — Addendum Note (Signed)
Addended by: Hulan Fray on: 12/29/2020 10:45 AM   Modules accepted: Orders

## 2021-01-04 ENCOUNTER — Observation Stay (HOSPITAL_COMMUNITY)
Admission: EM | Admit: 2021-01-04 | Discharge: 2021-01-04 | Disposition: A | Discharge status: Home / Self Care | Payer: Medicare Other | Attending: Internal Medicine | Admitting: Internal Medicine

## 2021-01-04 ENCOUNTER — Emergency Department (HOSPITAL_COMMUNITY): Payer: Medicare Other

## 2021-01-04 DIAGNOSIS — E8770 Fluid overload, unspecified: Secondary | ICD-10-CM | POA: Diagnosis not present

## 2021-01-04 DIAGNOSIS — R0600 Dyspnea, unspecified: Secondary | ICD-10-CM | POA: Diagnosis not present

## 2021-01-04 DIAGNOSIS — E875 Hyperkalemia: Secondary | ICD-10-CM | POA: Diagnosis not present

## 2021-01-04 DIAGNOSIS — Z7901 Long term (current) use of anticoagulants: Secondary | ICD-10-CM | POA: Insufficient documentation

## 2021-01-04 DIAGNOSIS — J449 Chronic obstructive pulmonary disease, unspecified: Secondary | ICD-10-CM | POA: Diagnosis not present

## 2021-01-04 DIAGNOSIS — Z992 Dependence on renal dialysis: Secondary | ICD-10-CM | POA: Diagnosis not present

## 2021-01-04 DIAGNOSIS — M545 Low back pain, unspecified: Secondary | ICD-10-CM

## 2021-01-04 DIAGNOSIS — F32A Depression, unspecified: Secondary | ICD-10-CM | POA: Insufficient documentation

## 2021-01-04 DIAGNOSIS — B20 Human immunodeficiency virus [HIV] disease: Secondary | ICD-10-CM | POA: Diagnosis present

## 2021-01-04 DIAGNOSIS — N186 End stage renal disease: Secondary | ICD-10-CM | POA: Diagnosis present

## 2021-01-04 DIAGNOSIS — Z9115 Patient's noncompliance with renal dialysis: Secondary | ICD-10-CM | POA: Diagnosis not present

## 2021-01-04 DIAGNOSIS — K219 Gastro-esophageal reflux disease without esophagitis: Secondary | ICD-10-CM | POA: Diagnosis not present

## 2021-01-04 DIAGNOSIS — R531 Weakness: Secondary | ICD-10-CM | POA: Diagnosis not present

## 2021-01-04 DIAGNOSIS — R609 Edema, unspecified: Secondary | ICD-10-CM | POA: Diagnosis not present

## 2021-01-04 DIAGNOSIS — Z79899 Other long term (current) drug therapy: Secondary | ICD-10-CM | POA: Insufficient documentation

## 2021-01-04 DIAGNOSIS — G8929 Other chronic pain: Secondary | ICD-10-CM

## 2021-01-04 DIAGNOSIS — Z86711 Personal history of pulmonary embolism: Secondary | ICD-10-CM | POA: Diagnosis not present

## 2021-01-04 DIAGNOSIS — Z87891 Personal history of nicotine dependence: Secondary | ICD-10-CM | POA: Diagnosis not present

## 2021-01-04 DIAGNOSIS — Z20822 Contact with and (suspected) exposure to covid-19: Secondary | ICD-10-CM | POA: Diagnosis not present

## 2021-01-04 DIAGNOSIS — R140 Abdominal distension (gaseous): Secondary | ICD-10-CM | POA: Diagnosis not present

## 2021-01-04 DIAGNOSIS — R0602 Shortness of breath: Secondary | ICD-10-CM | POA: Diagnosis not present

## 2021-01-04 DIAGNOSIS — I959 Hypotension, unspecified: Secondary | ICD-10-CM | POA: Diagnosis not present

## 2021-01-04 DIAGNOSIS — G588 Other specified mononeuropathies: Secondary | ICD-10-CM | POA: Diagnosis not present

## 2021-01-04 DIAGNOSIS — I5022 Chronic systolic (congestive) heart failure: Secondary | ICD-10-CM | POA: Insufficient documentation

## 2021-01-04 DIAGNOSIS — I517 Cardiomegaly: Secondary | ICD-10-CM | POA: Diagnosis not present

## 2021-01-04 LAB — CBC WITH DIFFERENTIAL/PLATELET
Abs Immature Granulocytes: 0 10*3/uL (ref 0.00–0.07)
Basophils Absolute: 0 10*3/uL (ref 0.0–0.1)
Basophils Relative: 1 %
Eosinophils Absolute: 0 10*3/uL (ref 0.0–0.5)
Eosinophils Relative: 1 %
HCT: 35.5 % — ABNORMAL LOW (ref 39.0–52.0)
Hemoglobin: 12.2 g/dL — ABNORMAL LOW (ref 13.0–17.0)
Immature Granulocytes: 0 %
Lymphocytes Relative: 35 %
Lymphs Abs: 1.5 10*3/uL (ref 0.7–4.0)
MCH: 36 pg — ABNORMAL HIGH (ref 26.0–34.0)
MCHC: 34.4 g/dL (ref 30.0–36.0)
MCV: 104.7 fL — ABNORMAL HIGH (ref 80.0–100.0)
Monocytes Absolute: 0.5 10*3/uL (ref 0.1–1.0)
Monocytes Relative: 12 %
Neutro Abs: 2.2 10*3/uL (ref 1.7–7.7)
Neutrophils Relative %: 51 %
Platelets: 127 10*3/uL — ABNORMAL LOW (ref 150–400)
RBC: 3.39 MIL/uL — ABNORMAL LOW (ref 4.22–5.81)
RDW: 16.6 % — ABNORMAL HIGH (ref 11.5–15.5)
WBC: 4.2 10*3/uL (ref 4.0–10.5)
nRBC: 0 % (ref 0.0–0.2)

## 2021-01-04 LAB — I-STAT CHEM 8, ED
BUN: >130 mg/dL — ABNORMAL HIGH (ref 6–20)
Calcium, Ion: 0.84 mmol/L — CL (ref 1.15–1.40)
Chloride: 93 mmol/L — ABNORMAL LOW (ref 98–111)
Creatinine, Ser: 17.5 mg/dL — ABNORMAL HIGH (ref 0.61–1.24)
Glucose, Bld: 104 mg/dL — ABNORMAL HIGH (ref 70–99)
HCT: 37 % — ABNORMAL LOW (ref 39.0–52.0)
Hemoglobin: 12.6 g/dL — ABNORMAL LOW (ref 13.0–17.0)
Potassium: 7.2 mmol/L (ref 3.5–5.1)
Sodium: 131 mmol/L — ABNORMAL LOW (ref 135–145)
TCO2: 25 mmol/L (ref 22–32)

## 2021-01-04 LAB — RENAL FUNCTION PANEL
Albumin: 3.6 g/dL (ref 3.5–5.0)
Anion gap: 27 — ABNORMAL HIGH (ref 5–15)
BUN: 142 mg/dL — ABNORMAL HIGH (ref 6–20)
CO2: 21 mmol/L — ABNORMAL LOW (ref 22–32)
Calcium: 8.2 mg/dL — ABNORMAL LOW (ref 8.9–10.3)
Chloride: 89 mmol/L — ABNORMAL LOW (ref 98–111)
Creatinine, Ser: 17.39 mg/dL — ABNORMAL HIGH (ref 0.61–1.24)
GFR, Estimated: 3 mL/min — ABNORMAL LOW (ref >60)
Glucose, Bld: 96 mg/dL (ref 70–99)
Phosphorus: >30 mg/dL — ABNORMAL HIGH (ref 2.5–4.6)
Potassium: 7.4 mmol/L (ref 3.5–5.1)
Sodium: 137 mmol/L (ref 135–145)

## 2021-01-04 LAB — BASIC METABOLIC PANEL
Anion gap: 27 — ABNORMAL HIGH (ref 5–15)
BUN: 139 mg/dL — ABNORMAL HIGH (ref 6–20)
CO2: 21 mmol/L — ABNORMAL LOW (ref 22–32)
Calcium: 8.3 mg/dL — ABNORMAL LOW (ref 8.9–10.3)
Chloride: 87 mmol/L — ABNORMAL LOW (ref 98–111)
Creatinine, Ser: 17.34 mg/dL — ABNORMAL HIGH (ref 0.61–1.24)
GFR, Estimated: 3 mL/min — ABNORMAL LOW (ref >60)
Glucose, Bld: 108 mg/dL — ABNORMAL HIGH (ref 70–99)
Potassium: >7.5 mmol/L (ref 3.5–5.1)
Sodium: 135 mmol/L (ref 135–145)

## 2021-01-04 LAB — RESP PANEL BY RT-PCR (FLU A&B, COVID) ARPGX2
Influenza A by PCR: NEGATIVE
Influenza B by PCR: NEGATIVE
SARS Coronavirus 2 by RT PCR: NEGATIVE

## 2021-01-04 LAB — HEPATITIS B SURFACE ANTIGEN: Hepatitis B Surface Ag: NONREACTIVE

## 2021-01-04 LAB — CBG MONITORING, ED: Glucose-Capillary: 108 mg/dL — ABNORMAL HIGH (ref 70–99)

## 2021-01-04 LAB — PROTIME-INR
INR: 1.6 — ABNORMAL HIGH (ref 0.8–1.2)
Prothrombin Time: 18.7 seconds — ABNORMAL HIGH (ref 11.4–15.2)

## 2021-01-04 MED ORDER — MIDODRINE HCL 5 MG PO TABS: 5.0000 mg | ORAL_TABLET | ORAL | Status: DC

## 2021-01-04 MED ORDER — MIDODRINE HCL 5 MG PO TABS
5.0000 mg | ORAL_TABLET | Freq: Three times a day (TID) | ORAL | Status: DC
  Administered 2021-01-04: 5 mg via ORAL
  Filled 2021-01-04: qty 1

## 2021-01-04 MED ORDER — ACETAMINOPHEN 325 MG PO TABS: 650.0000 mg | ORAL_TABLET | Freq: Four times a day (QID) | ORAL | Status: DC | PRN

## 2021-01-04 MED ORDER — FLUTICASONE PROPIONATE 50 MCG/ACT NA SUSP
2.0000 | Freq: Every day | NASAL | Status: DC
  Filled 2021-01-04: qty 16

## 2021-01-04 MED ORDER — SODIUM ZIRCONIUM CYCLOSILICATE 10 G PO PACK
10.0000 g | PACK | Freq: Once | ORAL | Status: AC
  Administered 2021-01-04: 10 g via ORAL
  Filled 2021-01-04: qty 1

## 2021-01-04 MED ORDER — INSULIN ASPART 100 UNIT/ML ~~LOC~~ SOLN
5.0000 [IU] | Freq: Once | SUBCUTANEOUS | Status: AC
  Administered 2021-01-04: 5 [IU] via INTRAVENOUS

## 2021-01-04 MED ORDER — WARFARIN SODIUM 4 MG PO TABS
4.0000 mg | ORAL_TABLET | Freq: Once | ORAL | Status: AC
  Administered 2021-01-04: 4 mg via ORAL
  Filled 2021-01-04: qty 1

## 2021-01-04 MED ORDER — EMTRICITABINE-TENOFOVIR AF 200-25 MG PO TABS
1.0000 | ORAL_TABLET | Freq: Every day | ORAL | Status: DC
  Filled 2021-01-04: qty 1

## 2021-01-04 MED ORDER — HEPARIN SODIUM (PORCINE) 5000 UNIT/ML IJ SOLN: 5000.0000 [IU] | Freq: Three times a day (TID) | INTRAMUSCULAR | Status: DC

## 2021-01-04 MED ORDER — DEXTROSE 50 % IV SOLN
1.0000 | Freq: Once | INTRAVENOUS | Status: AC
  Administered 2021-01-04: 50 mL via INTRAVENOUS
  Filled 2021-01-04: qty 50

## 2021-01-04 MED ORDER — DOLUTEGRAVIR SODIUM 50 MG PO TABS
50.0000 mg | ORAL_TABLET | Freq: Every day | ORAL | Status: DC
  Filled 2021-01-04: qty 1

## 2021-01-04 MED ORDER — SODIUM CHLORIDE 0.9% FLUSH
3.0000 mL | Freq: Two times a day (BID) | INTRAVENOUS | Status: DC
  Administered 2021-01-04: 3 mL via INTRAVENOUS

## 2021-01-04 MED ORDER — OXYCODONE HCL 5 MG PO TABS
5.0000 mg | ORAL_TABLET | Freq: Three times a day (TID) | ORAL | Status: DC | PRN
  Administered 2021-01-04 (×2): 5 mg via ORAL
  Filled 2021-01-04 (×2): qty 1

## 2021-01-04 MED ORDER — FERRIC CITRATE 1 GM 210 MG(FE) PO TABS
420.0000 mg | ORAL_TABLET | Freq: Three times a day (TID) | ORAL | Status: DC
Start: 2021-01-04 — End: 2021-01-04
  Administered 2021-01-04: 420 mg via ORAL
  Filled 2021-01-04: qty 2

## 2021-01-04 MED ORDER — WARFARIN SODIUM 4 MG PO TABS: 4.0000 mg | ORAL_TABLET | Freq: Once | ORAL | 0 refills | Status: DC

## 2021-01-04 MED ORDER — WARFARIN - PHARMACIST DOSING INPATIENT: Freq: Every day | Status: DC

## 2021-01-04 MED ORDER — HEPARIN SODIUM (PORCINE) 1000 UNIT/ML IJ SOLN
INTRAMUSCULAR | Status: AC
  Filled 2021-01-04: qty 3

## 2021-01-04 MED ORDER — POLYETHYLENE GLYCOL 3350 17 G PO PACK: 17.0000 g | PACK | Freq: Every day | ORAL | Status: DC | PRN

## 2021-01-04 MED ORDER — OXYCODONE-ACETAMINOPHEN 10-325 MG PO TABS
1.0000 | ORAL_TABLET | Freq: Three times a day (TID) | ORAL | Status: DC | PRN
Start: 2021-01-04 — End: 2021-01-04

## 2021-01-04 MED ORDER — DRONABINOL 2.5 MG PO CAPS
5.0000 mg | ORAL_CAPSULE | Freq: Every day | ORAL | Status: DC
  Administered 2021-01-04: 5 mg via ORAL
  Filled 2021-01-04 (×2): qty 2

## 2021-01-04 MED ORDER — WARFARIN SODIUM 7.5 MG PO TABS: 7.5000 mg | ORAL_TABLET | Freq: Once | ORAL | Status: DC

## 2021-01-04 MED ORDER — ALBUTEROL SULFATE HFA 108 (90 BASE) MCG/ACT IN AERS
8.0000 | INHALATION_SPRAY | Freq: Once | RESPIRATORY_TRACT | Status: AC
  Administered 2021-01-04: 8 via RESPIRATORY_TRACT
  Filled 2021-01-04: qty 6.7

## 2021-01-04 MED ORDER — OXYCODONE-ACETAMINOPHEN 5-325 MG PO TABS
1.0000 | ORAL_TABLET | Freq: Three times a day (TID) | ORAL | Status: DC | PRN
  Administered 2021-01-04 (×2): 1 via ORAL
  Filled 2021-01-04 (×2): qty 1

## 2021-01-04 MED ORDER — WARFARIN SODIUM 7.5 MG PO TABS: 7.5000 mg | ORAL_TABLET | Freq: Every day | ORAL | Status: DC

## 2021-01-04 MED ORDER — DIPHENHYDRAMINE HCL 25 MG PO CAPS
50.0000 mg | ORAL_CAPSULE | Freq: Three times a day (TID) | ORAL | Status: DC | PRN
  Administered 2021-01-04 (×2): 50 mg via ORAL
  Filled 2021-01-04 (×2): qty 2

## 2021-01-04 MED ORDER — ACETAMINOPHEN 650 MG RE SUPP: 650.0000 mg | Freq: Four times a day (QID) | RECTAL | Status: DC | PRN

## 2021-01-04 MED ORDER — WARFARIN - PHYSICIAN DOSING INPATIENT: Freq: Every day | Status: DC

## 2021-01-04 MED ORDER — CALCIUM GLUCONATE 10 % IV SOLN
1.0000 g | Freq: Once | INTRAVENOUS | Status: AC
  Administered 2021-01-04: 1 g via INTRAVENOUS
  Filled 2021-01-04: qty 10

## 2021-01-04 MED ORDER — WARFARIN SODIUM 4 MG PO TABS
4.0000 mg | ORAL_TABLET | Freq: Once | ORAL | Status: DC
  Filled 2021-01-04: qty 1
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
## 2021-01-04 NOTE — ED Triage Notes (Signed)
Pt BIB EMS. Pt says that he missed dialysis on Monday and Wednesday, last dialyzed on the Jan. 14th. Pt reports increased weakness in his arms in legs+some numbness, exertional dyspnea, and tightness in abdomen. Pt reports pain in abdomen 8/10. Pt BP 128/76 P 100 99% on RA @ rest - O2 dropped to 92 with EMS when walking, placed on [place] - no longer on [place]- 16 Temp - 98.6

## 2021-01-04 NOTE — Progress Notes (Signed)
NEW ADMISSION NOTE New Admission Note:   Arrival Method: Stretcher Mental Orientation: AAOx4 Telemetry: Patient refused Assessment: Completed Skin: has small open sores on left arm, chest and back, all in different stages of healing IV: Right Forearm Pain: 7/10 Tubes: n/a Safety Measures: Safety Fall Prevention Plan has been given, discussed and signed Admission: Completed [facility] [place] Orientation: Patient has been orientated to the room, unit and staff.  Family: none present at bedside  Orders have been reviewed and implemented. Will continue to monitor the patient. Call light has been placed within reach and bed alarm has been activated.   Vira Agar, RN
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
## 2021-01-04 NOTE — Progress Notes (Signed)
Notified by EDP that pt presented after missing 2 HD treatments with SOB and weakness.   Labs revealed K > 7.5, HCO3 21, BUN 139.  He received med mgmt for hyperK: insulin/dextrose, lokelma, Calcium 1gm.  EKG with some peaking of Ts, similar though to previous.  Pt admitted under observation.  Plan for HD this AM: 3h, 1K, tight heparin, 3L UF, 400/800, AVF.  If status changes to inpatient please notify us for formal consultation.
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
## 2021-01-04 NOTE — Progress Notes (Signed)
DISCHARGE NOTE HOME RIYANSH WESTMEYER to be discharged Home per MD order. Discussed prescriptions and follow up appointments with the patient. Prescriptions given to patient; medication list explained in detail. Patient verbalized understanding.  Skin clean, dry and intact without evidence of skin break down, no evidence of skin tears noted. IV catheter discontinued intact. Site without signs and symptoms of complications. Dressing and pressure applied. Pt denies pain at the site currently. No complaints noted.  Patient free of lines, drains, and wounds.   An After Visit Summary (AVS) was printed and given to the patient. Patient escorted via wheelchair, and discharged home via private auto.  Vira Agar, RN

## 2021-01-04 NOTE — ED Notes (Signed)
Dr Heber [place] notified of potassium level

## 2021-01-04 NOTE — ED Notes (Signed)
Gave critical result to PA Quincy Carnes

## 2021-01-04 NOTE — ED Notes (Signed)
Breakfast ordered 
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
## 2021-01-04 NOTE — Procedures (Signed)
I was present at this dialysis session. I have reviewed the session itself and made appropriate changes.   Just starting. AVF 1K bath UF goal 4L.  If improved after HD, then he should be discharged to go to outpt HD tomorrow.  I have strongly counseled him that missing HD and using the hospital is unwise given current covid surge.   Filed Weights   01/04/21 0115  Weight: 83.9 kg    Recent Labs  Lab 01/04/21 0347  NA 137  K 7.4*  CL 89*  CO2 21*  GLUCOSE 96  BUN 142*  CREATININE 17.39*  CALCIUM 8.2*  PHOS >30.0*    Recent Labs  Lab 01/04/21 0115 01/04/21 0147  WBC 4.2  --   NEUTROABS 2.2  --   HGB 12.2* 12.6*  HCT 35.5* 37.0*  MCV 104.7*  --   PLT 127*  --     Scheduled Meds: . dolutegravir  50 mg Oral QHS  . dronabinol  5 mg Oral QAC lunch  . emtricitabine-tenofovir AF  1 tablet Oral QHS  . fluticasone  2 spray Each Nare Daily  . midodrine  5 mg Oral TID WC  . sodium chloride flush  3 mL Intravenous Q12H  . warfarin  7.5 mg Oral ONCE-1600  . Warfarin - Pharmacist Dosing Inpatient   Does not apply q1600   Continuous Infusions: PRN Meds:.acetaminophen **OR** acetaminophen, diphenhydrAMINE, oxyCODONE-acetaminophen **AND** oxyCODONE, polyethylene glycol   Pearson Grippe  MD 01/04/2021, 8:16 AM

## 2021-01-04 NOTE — Discharge Instructions (Addendum)
Mr. Schnipke,  It was a pleasure taking care of you in the hospital. You were admitted for symptoms of weakness, shortness of breath, and itching from missing 2 hemodialysis sessions. Your potassium was dangerously high. It is very important that you maintain your HD schedule. You are scheduled for your next HD session tomorrow, 01/05/21. Try your best to adhere to a low potassium diet (see handout attached).  We also discussed your warfarin which you had stopped taking. Based on your INR level, we would like you to take warfarin 4 mg (1 tablet) daily and send your self-tested INR results to Dr. Elie Confer daily. He will help you monitor your INR level and adjust your dosing accordingly.  Follow-up with your PCP, Dr. Allyson Sabal, as directed.  Take care!

## 2021-01-04 NOTE — Plan of Care (Signed)

## 2021-01-04 NOTE — ED Notes (Signed)
Pt states he is allergic to adhesive from electordes and can not keep electrodes on. EDP notified EKG captured.

## 2021-01-08 DIAGNOSIS — E875 Hyperkalemia: Secondary | ICD-10-CM | POA: Diagnosis not present

## 2021-01-08 DIAGNOSIS — N186 End stage renal disease: Secondary | ICD-10-CM | POA: Diagnosis not present

## 2021-01-08 DIAGNOSIS — N2581 Secondary hyperparathyroidism of renal origin: Secondary | ICD-10-CM | POA: Diagnosis not present

## 2021-01-08 DIAGNOSIS — Z992 Dependence on renal dialysis: Secondary | ICD-10-CM | POA: Diagnosis not present

## 2021-01-08 DIAGNOSIS — D631 Anemia in chronic kidney disease: Secondary | ICD-10-CM | POA: Diagnosis not present

## 2021-01-08 DIAGNOSIS — D509 Iron deficiency anemia, unspecified: Secondary | ICD-10-CM | POA: Diagnosis not present

## 2021-01-10 DIAGNOSIS — N2581 Secondary hyperparathyroidism of renal origin: Secondary | ICD-10-CM | POA: Diagnosis not present

## 2021-01-10 DIAGNOSIS — E875 Hyperkalemia: Secondary | ICD-10-CM | POA: Diagnosis not present

## 2021-01-10 DIAGNOSIS — D509 Iron deficiency anemia, unspecified: Secondary | ICD-10-CM | POA: Diagnosis not present

## 2021-01-10 DIAGNOSIS — D631 Anemia in chronic kidney disease: Secondary | ICD-10-CM | POA: Diagnosis not present

## 2021-01-10 DIAGNOSIS — Z992 Dependence on renal dialysis: Secondary | ICD-10-CM | POA: Diagnosis not present

## 2021-01-10 DIAGNOSIS — N186 End stage renal disease: Secondary | ICD-10-CM | POA: Diagnosis not present

## 2021-01-12 DIAGNOSIS — N2581 Secondary hyperparathyroidism of renal origin: Secondary | ICD-10-CM | POA: Diagnosis not present

## 2021-01-12 DIAGNOSIS — N186 End stage renal disease: Secondary | ICD-10-CM | POA: Diagnosis not present

## 2021-01-12 DIAGNOSIS — D509 Iron deficiency anemia, unspecified: Secondary | ICD-10-CM | POA: Diagnosis not present

## 2021-01-12 DIAGNOSIS — E875 Hyperkalemia: Secondary | ICD-10-CM | POA: Diagnosis not present

## 2021-01-12 DIAGNOSIS — Z992 Dependence on renal dialysis: Secondary | ICD-10-CM | POA: Diagnosis not present

## 2021-01-12 DIAGNOSIS — D631 Anemia in chronic kidney disease: Secondary | ICD-10-CM | POA: Diagnosis not present

## 2021-01-13 ENCOUNTER — Other Ambulatory Visit: Payer: Self-pay | Admitting: Internal Medicine

## 2021-01-13 DIAGNOSIS — B20 Human immunodeficiency virus [HIV] disease: Secondary | ICD-10-CM

## 2021-01-15 ENCOUNTER — Telehealth: Payer: Self-pay

## 2021-01-15 DIAGNOSIS — E875 Hyperkalemia: Secondary | ICD-10-CM | POA: Diagnosis not present

## 2021-01-15 DIAGNOSIS — D509 Iron deficiency anemia, unspecified: Secondary | ICD-10-CM | POA: Diagnosis not present

## 2021-01-15 DIAGNOSIS — N186 End stage renal disease: Secondary | ICD-10-CM | POA: Diagnosis not present

## 2021-01-15 DIAGNOSIS — Z992 Dependence on renal dialysis: Secondary | ICD-10-CM | POA: Diagnosis not present

## 2021-01-15 DIAGNOSIS — N2581 Secondary hyperparathyroidism of renal origin: Secondary | ICD-10-CM | POA: Diagnosis not present

## 2021-01-15 DIAGNOSIS — D631 Anemia in chronic kidney disease: Secondary | ICD-10-CM | POA: Diagnosis not present

## 2021-01-15 NOTE — Telephone Encounter (Signed)
Reached out to patient due to overdue annual visit. Patient states this is a "bad time" and will give the office a call back tomorrow to schedule office visit with labs.  Refill denied at this time.  Brian [name]

## 2021-01-16 DIAGNOSIS — I120 Hypertensive chronic kidney disease with stage 5 chronic kidney disease or end stage renal disease: Secondary | ICD-10-CM | POA: Diagnosis not present

## 2021-01-16 DIAGNOSIS — Z992 Dependence on renal dialysis: Secondary | ICD-10-CM | POA: Diagnosis not present

## 2021-01-16 DIAGNOSIS — N186 End stage renal disease: Secondary | ICD-10-CM | POA: Diagnosis not present

## 2021-01-17 DIAGNOSIS — Z992 Dependence on renal dialysis: Secondary | ICD-10-CM | POA: Diagnosis not present

## 2021-01-17 DIAGNOSIS — E875 Hyperkalemia: Secondary | ICD-10-CM | POA: Diagnosis not present

## 2021-01-17 DIAGNOSIS — N186 End stage renal disease: Secondary | ICD-10-CM | POA: Diagnosis not present

## 2021-01-17 DIAGNOSIS — N2581 Secondary hyperparathyroidism of renal origin: Secondary | ICD-10-CM | POA: Diagnosis not present

## 2021-01-18 ENCOUNTER — Other Ambulatory Visit: Payer: Self-pay

## 2021-01-18 DIAGNOSIS — Z79891 Long term (current) use of opiate analgesic: Secondary | ICD-10-CM

## 2021-01-18 NOTE — Telephone Encounter (Signed)
Pt is requesting his oxyCODONE-acetaminophen (PERCOCET) 10-325 MG tablet and also his dronabinol (MARINOL) 5 MG capsule  Sent to  [facility], [address] Phone:  [phone]  Fax:  [phone]

## 2021-01-18 NOTE — Telephone Encounter (Signed)
Received TC from patient who is asking for refills on Oxycodone and Marinol.  Also wanting to know if he is due for a f/u visit, states he has an appt on 01/26/21, but if he doesn't need to f/u he will cancel. RN instructed pt to keep this appt as it was recommended he f/u 1 month after his LOV.  He was also hospitalized on 1/20.  He agreed. Will send refill request to PCP. SChaplin, RN,BSN

## 2021-01-19 DIAGNOSIS — N186 End stage renal disease: Secondary | ICD-10-CM | POA: Diagnosis not present

## 2021-01-19 DIAGNOSIS — N2581 Secondary hyperparathyroidism of renal origin: Secondary | ICD-10-CM | POA: Diagnosis not present

## 2021-01-19 DIAGNOSIS — Z992 Dependence on renal dialysis: Secondary | ICD-10-CM | POA: Diagnosis not present

## 2021-01-19 DIAGNOSIS — E875 Hyperkalemia: Secondary | ICD-10-CM | POA: Diagnosis not present

## 2021-01-19 MED ORDER — OXYCODONE-ACETAMINOPHEN 10-325 MG PO TABS
1.0000 | ORAL_TABLET | Freq: Three times a day (TID) | ORAL | 0 refills | Status: DC | PRN
Start: 2021-01-19 — End: 2021-02-14

## 2021-01-19 MED ORDER — DRONABINOL 5 MG PO CAPS: 5.0000 mg | ORAL_CAPSULE | Freq: Every day | ORAL | 2 refills | Status: DC

## 2021-01-19 NOTE — Telephone Encounter (Signed)
Please call pt back about meds.  

## 2021-01-22 DIAGNOSIS — Z992 Dependence on renal dialysis: Secondary | ICD-10-CM | POA: Diagnosis not present

## 2021-01-22 DIAGNOSIS — E875 Hyperkalemia: Secondary | ICD-10-CM | POA: Diagnosis not present

## 2021-01-22 DIAGNOSIS — N186 End stage renal disease: Secondary | ICD-10-CM | POA: Diagnosis not present

## 2021-01-22 DIAGNOSIS — N2581 Secondary hyperparathyroidism of renal origin: Secondary | ICD-10-CM | POA: Diagnosis not present

## 2021-01-25 DIAGNOSIS — N186 End stage renal disease: Secondary | ICD-10-CM | POA: Diagnosis not present

## 2021-01-25 DIAGNOSIS — E875 Hyperkalemia: Secondary | ICD-10-CM | POA: Diagnosis not present

## 2021-01-25 DIAGNOSIS — N2581 Secondary hyperparathyroidism of renal origin: Secondary | ICD-10-CM | POA: Diagnosis not present

## 2021-01-25 DIAGNOSIS — Z992 Dependence on renal dialysis: Secondary | ICD-10-CM | POA: Diagnosis not present

## 2021-01-26 ENCOUNTER — Other Ambulatory Visit: Payer: Self-pay

## 2021-01-26 ENCOUNTER — Encounter: Payer: Self-pay | Admitting: Student

## 2021-01-26 ENCOUNTER — Ambulatory Visit (INDEPENDENT_AMBULATORY_CARE_PROVIDER_SITE_OTHER): Payer: Medicare Other | Admitting: Student

## 2021-01-26 DIAGNOSIS — Z992 Dependence on renal dialysis: Secondary | ICD-10-CM | POA: Diagnosis not present

## 2021-01-26 DIAGNOSIS — N186 End stage renal disease: Secondary | ICD-10-CM | POA: Diagnosis not present

## 2021-01-26 DIAGNOSIS — N2581 Secondary hyperparathyroidism of renal origin: Secondary | ICD-10-CM | POA: Diagnosis not present

## 2021-01-26 DIAGNOSIS — E875 Hyperkalemia: Secondary | ICD-10-CM | POA: Diagnosis not present

## 2021-01-26 MED ORDER — ALBUTEROL SULFATE HFA 108 (90 BASE) MCG/ACT IN AERS: INHALATION_SPRAY | RESPIRATORY_TRACT | 6 refills | Status: AC

## 2021-01-26 NOTE — Assessment & Plan Note (Addendum)
Patient was recently hospitalized for hyperkalemia from 2 missed HD sessions. He came in to the [facility] today for hospital f/u. He reports that he missed his Wednesday HD session due to transportation issues, but he has since resolved this issue. The dialysis center was able to schedule him in on Thursday (yesterday) so he attended HD yesterday and he is scheduled to go again today. Discussed the importance of HD for him and risks of missing those sessions. He confirms understanding. 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 I will not check a BMP today to assess for hyperkalemia as patient attended HD yesterday and is scheduled to go again today.  Plan: -strict adherence to HD MWF
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
## 2021-01-26 NOTE — Patient Instructions (Signed)
Brian [name],   It was a pleasure seeing you in the clinic today.   We discussed the following today:  1. I have refilled you inhaler.  2. Please make sure that you regularly go to your hemodialysis sessions.   3. I will see you again in 3 months!  Please call our clinic at [phone] if you have any questions or concerns. The best time to call is Monday-Friday from 9am-4pm, but there is someone available 24/7 at the same number. If you need medication refills, please notify your pharmacy one week in advance and they will send [place] a request.   Thank you for letting us take part in your care. We look forward to seeing you next time!

## 2021-01-26 NOTE — Progress Notes (Signed)
Internal Medicine Clinic Attending  Case discussed with Dr. Jinwala  At the time of the visit.  We reviewed the resident's history and exam and pertinent patient test results.  I agree with the assessment, diagnosis, and plan of care documented in the resident's note.  

## 2021-01-29 DIAGNOSIS — E875 Hyperkalemia: Secondary | ICD-10-CM | POA: Diagnosis not present

## 2021-01-29 DIAGNOSIS — Z992 Dependence on renal dialysis: Secondary | ICD-10-CM | POA: Diagnosis not present

## 2021-01-29 DIAGNOSIS — N186 End stage renal disease: Secondary | ICD-10-CM | POA: Diagnosis not present

## 2021-01-29 DIAGNOSIS — N2581 Secondary hyperparathyroidism of renal origin: Secondary | ICD-10-CM | POA: Diagnosis not present

## 2021-01-30 ENCOUNTER — Other Ambulatory Visit: Payer: Medicare Other

## 2021-01-31 DIAGNOSIS — Z992 Dependence on renal dialysis: Secondary | ICD-10-CM | POA: Diagnosis not present

## 2021-01-31 DIAGNOSIS — N186 End stage renal disease: Secondary | ICD-10-CM | POA: Diagnosis not present

## 2021-01-31 DIAGNOSIS — N2581 Secondary hyperparathyroidism of renal origin: Secondary | ICD-10-CM | POA: Diagnosis not present

## 2021-01-31 DIAGNOSIS — E875 Hyperkalemia: Secondary | ICD-10-CM | POA: Diagnosis not present

## 2021-02-02 DIAGNOSIS — N186 End stage renal disease: Secondary | ICD-10-CM | POA: Diagnosis not present

## 2021-02-02 DIAGNOSIS — E875 Hyperkalemia: Secondary | ICD-10-CM | POA: Diagnosis not present

## 2021-02-02 DIAGNOSIS — Z992 Dependence on renal dialysis: Secondary | ICD-10-CM | POA: Diagnosis not present

## 2021-02-02 DIAGNOSIS — N2581 Secondary hyperparathyroidism of renal origin: Secondary | ICD-10-CM | POA: Diagnosis not present

## 2021-02-05 DIAGNOSIS — N2581 Secondary hyperparathyroidism of renal origin: Secondary | ICD-10-CM | POA: Diagnosis not present

## 2021-02-05 DIAGNOSIS — N186 End stage renal disease: Secondary | ICD-10-CM | POA: Diagnosis not present

## 2021-02-05 DIAGNOSIS — Z992 Dependence on renal dialysis: Secondary | ICD-10-CM | POA: Diagnosis not present

## 2021-02-05 DIAGNOSIS — E875 Hyperkalemia: Secondary | ICD-10-CM | POA: Diagnosis not present

## 2021-02-07 DIAGNOSIS — Z992 Dependence on renal dialysis: Secondary | ICD-10-CM | POA: Diagnosis not present

## 2021-02-07 DIAGNOSIS — E875 Hyperkalemia: Secondary | ICD-10-CM | POA: Diagnosis not present

## 2021-02-07 DIAGNOSIS — N2581 Secondary hyperparathyroidism of renal origin: Secondary | ICD-10-CM | POA: Diagnosis not present

## 2021-02-07 DIAGNOSIS — N186 End stage renal disease: Secondary | ICD-10-CM | POA: Diagnosis not present

## 2021-02-09 DIAGNOSIS — N2581 Secondary hyperparathyroidism of renal origin: Secondary | ICD-10-CM | POA: Diagnosis not present

## 2021-02-09 DIAGNOSIS — N186 End stage renal disease: Secondary | ICD-10-CM | POA: Diagnosis not present

## 2021-02-09 DIAGNOSIS — E875 Hyperkalemia: Secondary | ICD-10-CM | POA: Diagnosis not present

## 2021-02-09 DIAGNOSIS — Z992 Dependence on renal dialysis: Secondary | ICD-10-CM | POA: Diagnosis not present

## 2021-02-12 DIAGNOSIS — N186 End stage renal disease: Secondary | ICD-10-CM | POA: Diagnosis not present

## 2021-02-12 DIAGNOSIS — Z992 Dependence on renal dialysis: Secondary | ICD-10-CM | POA: Diagnosis not present

## 2021-02-12 DIAGNOSIS — N2581 Secondary hyperparathyroidism of renal origin: Secondary | ICD-10-CM | POA: Diagnosis not present

## 2021-02-12 DIAGNOSIS — E875 Hyperkalemia: Secondary | ICD-10-CM | POA: Diagnosis not present

## 2021-02-13 DIAGNOSIS — I120 Hypertensive chronic kidney disease with stage 5 chronic kidney disease or end stage renal disease: Secondary | ICD-10-CM | POA: Diagnosis not present

## 2021-02-13 DIAGNOSIS — Z992 Dependence on renal dialysis: Secondary | ICD-10-CM | POA: Diagnosis not present

## 2021-02-13 DIAGNOSIS — N186 End stage renal disease: Secondary | ICD-10-CM | POA: Diagnosis not present

## 2021-02-14 ENCOUNTER — Other Ambulatory Visit: Payer: Self-pay

## 2021-02-14 DIAGNOSIS — N186 End stage renal disease: Secondary | ICD-10-CM | POA: Diagnosis not present

## 2021-02-14 DIAGNOSIS — Z79891 Long term (current) use of opiate analgesic: Secondary | ICD-10-CM

## 2021-02-14 DIAGNOSIS — E875 Hyperkalemia: Secondary | ICD-10-CM | POA: Diagnosis not present

## 2021-02-14 DIAGNOSIS — N2581 Secondary hyperparathyroidism of renal origin: Secondary | ICD-10-CM | POA: Diagnosis not present

## 2021-02-14 DIAGNOSIS — Z992 Dependence on renal dialysis: Secondary | ICD-10-CM | POA: Diagnosis not present

## 2021-02-14 NOTE — Telephone Encounter (Signed)
Pt is requesting his oxyCODONE-acetaminophen (PERCOCET) 10-325 MG tablet and dronabinol (MARINOL) 5 MG capsule sent to  [facility], [place] AT [place] Phone:  [phone]  Fax:  [phone]

## 2021-02-14 NOTE — Telephone Encounter (Signed)
Dronabinol RX sent 01/19/21 w/ 2 refills.  Oxycodone RX also sent 01/19/21, RX states 1/3, RN does not see 2 other RX's written on this date in history.  TC to pharmacy, was told pt does not have any Oxycodone on hold. Forwarding to red team. LOV 01/26/21 Drug screen 04/25/20 NOV 04/20/21 SChaplin, RN,BSN

## 2021-02-15 MED ORDER — OXYCODONE-ACETAMINOPHEN 10-325 MG PO TABS
1.0000 | ORAL_TABLET | Freq: Three times a day (TID) | ORAL | 0 refills | Status: DC | PRN
Start: 2021-02-15 — End: 2021-03-16

## 2021-02-16 DIAGNOSIS — N186 End stage renal disease: Secondary | ICD-10-CM | POA: Diagnosis not present

## 2021-02-16 DIAGNOSIS — E875 Hyperkalemia: Secondary | ICD-10-CM | POA: Diagnosis not present

## 2021-02-16 DIAGNOSIS — N2581 Secondary hyperparathyroidism of renal origin: Secondary | ICD-10-CM | POA: Diagnosis not present

## 2021-02-16 DIAGNOSIS — Z992 Dependence on renal dialysis: Secondary | ICD-10-CM | POA: Diagnosis not present

## 2021-02-19 DIAGNOSIS — Z992 Dependence on renal dialysis: Secondary | ICD-10-CM | POA: Diagnosis not present

## 2021-02-19 DIAGNOSIS — N186 End stage renal disease: Secondary | ICD-10-CM | POA: Diagnosis not present

## 2021-02-19 DIAGNOSIS — E875 Hyperkalemia: Secondary | ICD-10-CM | POA: Diagnosis not present

## 2021-02-19 DIAGNOSIS — N2581 Secondary hyperparathyroidism of renal origin: Secondary | ICD-10-CM | POA: Diagnosis not present

## 2021-02-21 DIAGNOSIS — Z992 Dependence on renal dialysis: Secondary | ICD-10-CM | POA: Diagnosis not present

## 2021-02-21 DIAGNOSIS — E875 Hyperkalemia: Secondary | ICD-10-CM | POA: Diagnosis not present

## 2021-02-21 DIAGNOSIS — N2581 Secondary hyperparathyroidism of renal origin: Secondary | ICD-10-CM | POA: Diagnosis not present

## 2021-02-21 DIAGNOSIS — N186 End stage renal disease: Secondary | ICD-10-CM | POA: Diagnosis not present

## 2021-02-23 DIAGNOSIS — N186 End stage renal disease: Secondary | ICD-10-CM | POA: Diagnosis not present

## 2021-02-23 DIAGNOSIS — N2581 Secondary hyperparathyroidism of renal origin: Secondary | ICD-10-CM | POA: Diagnosis not present

## 2021-02-23 DIAGNOSIS — Z992 Dependence on renal dialysis: Secondary | ICD-10-CM | POA: Diagnosis not present

## 2021-02-23 DIAGNOSIS — E875 Hyperkalemia: Secondary | ICD-10-CM | POA: Diagnosis not present

## 2021-02-26 DIAGNOSIS — N2581 Secondary hyperparathyroidism of renal origin: Secondary | ICD-10-CM | POA: Diagnosis not present

## 2021-02-26 DIAGNOSIS — Z992 Dependence on renal dialysis: Secondary | ICD-10-CM | POA: Diagnosis not present

## 2021-02-26 DIAGNOSIS — N186 End stage renal disease: Secondary | ICD-10-CM | POA: Diagnosis not present

## 2021-02-26 DIAGNOSIS — E875 Hyperkalemia: Secondary | ICD-10-CM | POA: Diagnosis not present

## 2021-02-27 ENCOUNTER — Encounter: Payer: Medicare Other | Admitting: Internal Medicine

## 2021-02-28 DIAGNOSIS — E875 Hyperkalemia: Secondary | ICD-10-CM | POA: Diagnosis not present

## 2021-02-28 DIAGNOSIS — N2581 Secondary hyperparathyroidism of renal origin: Secondary | ICD-10-CM | POA: Diagnosis not present

## 2021-02-28 DIAGNOSIS — N186 End stage renal disease: Secondary | ICD-10-CM | POA: Diagnosis not present

## 2021-02-28 DIAGNOSIS — Z992 Dependence on renal dialysis: Secondary | ICD-10-CM | POA: Diagnosis not present

## 2021-03-01 ENCOUNTER — Other Ambulatory Visit: Payer: Medicare Other

## 2021-03-01 ENCOUNTER — Other Ambulatory Visit: Payer: Self-pay

## 2021-03-01 DIAGNOSIS — B20 Human immunodeficiency virus [HIV] disease: Secondary | ICD-10-CM | POA: Diagnosis not present

## 2021-03-01 DIAGNOSIS — Z113 Encounter for screening for infections with a predominantly sexual mode of transmission: Secondary | ICD-10-CM

## 2021-03-02 ENCOUNTER — Telehealth: Payer: Self-pay

## 2021-03-02 DIAGNOSIS — N2581 Secondary hyperparathyroidism of renal origin: Secondary | ICD-10-CM | POA: Diagnosis not present

## 2021-03-02 DIAGNOSIS — N186 End stage renal disease: Secondary | ICD-10-CM | POA: Diagnosis not present

## 2021-03-02 DIAGNOSIS — E875 Hyperkalemia: Secondary | ICD-10-CM | POA: Diagnosis not present

## 2021-03-02 DIAGNOSIS — Z992 Dependence on renal dialysis: Secondary | ICD-10-CM | POA: Diagnosis not present

## 2021-03-02 LAB — T-HELPER CELL (CD4) - (RCID CLINIC ONLY)
CD4 % Helper T Cell: 45 % (ref 33–65)
CD4 T Cell Abs: 728 /uL (ref 400–1790)

## 2021-03-02 NOTE — Telephone Encounter (Signed)
Thanks Dori  I think he is esrd on dialysis. So no worry

## 2021-03-02 NOTE — Telephone Encounter (Signed)
Received critical lab values from [facility] for patient's Creatinine. Cr was 11.36 on 03/01/2021. Labs are in patient's chart. Patient has ESRD and is on dialysis. Notifiying provider per policy.   Marytza Grandpre Lorita Officer, RN

## 2021-03-04 LAB — COMPLETE METABOLIC PANEL WITH GFR
AG Ratio: 1.6 (calc) (ref 1.0–2.5)
ALT: 12 U/L (ref 9–46)
AST: 22 U/L (ref 10–40)
Albumin: 4.7 g/dL (ref 3.6–5.1)
Alkaline phosphatase (APISO): 78 U/L (ref 36–130)
BUN/Creatinine Ratio: 5 (calc) — ABNORMAL LOW (ref 6–22)
BUN: 53 mg/dL — ABNORMAL HIGH (ref 7–25)
CO2: 31 mmol/L (ref 20–32)
Calcium: 9.5 mg/dL (ref 8.6–10.3)
Chloride: 88 mmol/L — ABNORMAL LOW (ref 98–110)
Creat: 11.36 mg/dL — ABNORMAL HIGH (ref 0.60–1.35)
GFR, Est African American: 6 mL/min/{1.73_m2} — ABNORMAL LOW (ref >=60)
GFR, Est Non African American: 5 mL/min/{1.73_m2} — ABNORMAL LOW (ref >=60)
Globulin: 3 g/dL (calc) (ref 1.9–3.7)
Glucose, Bld: 101 mg/dL — ABNORMAL HIGH (ref 65–99)
Potassium: 5.6 mmol/L — ABNORMAL HIGH (ref 3.5–5.3)
Sodium: 136 mmol/L (ref 135–146)
Total Bilirubin: 0.9 mg/dL (ref 0.2–1.2)
Total Protein: 7.7 g/dL (ref 6.1–8.1)

## 2021-03-04 LAB — CBC WITH DIFFERENTIAL/PLATELET
Absolute Monocytes: 667 cells/uL (ref 200–950)
Basophils Absolute: 30 cells/uL (ref 0–200)
Basophils Relative: 0.7 %
Eosinophils Absolute: 60 cells/uL (ref 15–500)
Eosinophils Relative: 1.4 %
HCT: 52.1 % — ABNORMAL HIGH (ref 38.5–50.0)
Hemoglobin: 17.6 g/dL — ABNORMAL HIGH (ref 13.2–17.1)
Lymphs Abs: 1703 cells/uL (ref 850–3900)
MCH: 34.4 pg — ABNORMAL HIGH (ref 27.0–33.0)
MCHC: 33.8 g/dL (ref 32.0–36.0)
MCV: 102 fL — ABNORMAL HIGH (ref 80.0–100.0)
MPV: 11.3 fL (ref 7.5–12.5)
Monocytes Relative: 15.5 %
Neutro Abs: 1840 cells/uL (ref 1500–7800)
Neutrophils Relative %: 42.8 %
Platelets: 124 10*3/uL — ABNORMAL LOW (ref 140–400)
RBC: 5.11 10*6/uL (ref 4.20–5.80)
RDW: 15.6 % — ABNORMAL HIGH (ref 11.0–15.0)
Total Lymphocyte: 39.6 %
WBC: 4.3 10*3/uL (ref 3.8–10.8)

## 2021-03-04 LAB — HIV-1 RNA QUANT-NO REFLEX-BLD
HIV 1 RNA Quant: <20 Copies/mL — ABNORMAL HIGH
HIV-1 RNA Quant, Log: <1.3 Log cps/mL — ABNORMAL HIGH

## 2021-03-04 LAB — RPR: RPR Ser Ql: NONREACTIVE

## 2021-03-05 DIAGNOSIS — Z992 Dependence on renal dialysis: Secondary | ICD-10-CM | POA: Diagnosis not present

## 2021-03-05 DIAGNOSIS — N2581 Secondary hyperparathyroidism of renal origin: Secondary | ICD-10-CM | POA: Diagnosis not present

## 2021-03-05 DIAGNOSIS — N186 End stage renal disease: Secondary | ICD-10-CM | POA: Diagnosis not present

## 2021-03-05 DIAGNOSIS — E875 Hyperkalemia: Secondary | ICD-10-CM | POA: Diagnosis not present

## 2021-03-07 NOTE — Telephone Encounter (Signed)
right

## 2021-03-09 DIAGNOSIS — N186 End stage renal disease: Secondary | ICD-10-CM | POA: Diagnosis not present

## 2021-03-09 DIAGNOSIS — Z992 Dependence on renal dialysis: Secondary | ICD-10-CM | POA: Diagnosis not present

## 2021-03-09 DIAGNOSIS — N2581 Secondary hyperparathyroidism of renal origin: Secondary | ICD-10-CM | POA: Diagnosis not present

## 2021-03-09 DIAGNOSIS — E875 Hyperkalemia: Secondary | ICD-10-CM | POA: Diagnosis not present

## 2021-03-12 DIAGNOSIS — N2581 Secondary hyperparathyroidism of renal origin: Secondary | ICD-10-CM | POA: Diagnosis not present

## 2021-03-12 DIAGNOSIS — E875 Hyperkalemia: Secondary | ICD-10-CM | POA: Diagnosis not present

## 2021-03-12 DIAGNOSIS — N186 End stage renal disease: Secondary | ICD-10-CM | POA: Diagnosis not present

## 2021-03-12 DIAGNOSIS — Z992 Dependence on renal dialysis: Secondary | ICD-10-CM | POA: Diagnosis not present

## 2021-03-15 DIAGNOSIS — N2581 Secondary hyperparathyroidism of renal origin: Secondary | ICD-10-CM | POA: Diagnosis not present

## 2021-03-15 DIAGNOSIS — Z992 Dependence on renal dialysis: Secondary | ICD-10-CM | POA: Diagnosis not present

## 2021-03-15 DIAGNOSIS — E875 Hyperkalemia: Secondary | ICD-10-CM | POA: Diagnosis not present

## 2021-03-15 DIAGNOSIS — N186 End stage renal disease: Secondary | ICD-10-CM | POA: Diagnosis not present

## 2021-03-16 ENCOUNTER — Other Ambulatory Visit: Payer: Self-pay | Admitting: Student

## 2021-03-16 DIAGNOSIS — N2581 Secondary hyperparathyroidism of renal origin: Secondary | ICD-10-CM | POA: Diagnosis not present

## 2021-03-16 DIAGNOSIS — Z79891 Long term (current) use of opiate analgesic: Secondary | ICD-10-CM

## 2021-03-16 DIAGNOSIS — I120 Hypertensive chronic kidney disease with stage 5 chronic kidney disease or end stage renal disease: Secondary | ICD-10-CM | POA: Diagnosis not present

## 2021-03-16 DIAGNOSIS — E875 Hyperkalemia: Secondary | ICD-10-CM | POA: Diagnosis not present

## 2021-03-16 DIAGNOSIS — Z992 Dependence on renal dialysis: Secondary | ICD-10-CM | POA: Diagnosis not present

## 2021-03-16 DIAGNOSIS — N186 End stage renal disease: Secondary | ICD-10-CM | POA: Diagnosis not present

## 2021-03-16 NOTE — Telephone Encounter (Signed)
MEDICATION REFILL  dronabinol (MARINOL) 5 MG capsule  oxyCODONE-acetaminophen (PERCOCET) 10-325 MG tablet  [facility] [id number] [place] [address] [place] Phone:  [phone]  Fax:  [phone]

## 2021-03-16 NOTE — Telephone Encounter (Signed)
Dronabinol sent on 01/19/21 #30 with 2 RF's (should have 1 refill left)  Oxycodone sent 3/3 (RX note states 1/3, call to pharmacy and no RX's on hold) LOV 01/26/21 NOV 04/18/21 Drug screen 04/25/20

## 2021-03-19 MED ORDER — OXYCODONE-ACETAMINOPHEN 10-325 MG PO TABS
1.0000 | ORAL_TABLET | Freq: Three times a day (TID) | ORAL | 0 refills | Status: DC | PRN
Start: 2021-03-19 — End: 2021-04-18

## 2021-03-21 DIAGNOSIS — N2581 Secondary hyperparathyroidism of renal origin: Secondary | ICD-10-CM | POA: Diagnosis not present

## 2021-03-21 DIAGNOSIS — Z992 Dependence on renal dialysis: Secondary | ICD-10-CM | POA: Diagnosis not present

## 2021-03-21 DIAGNOSIS — N186 End stage renal disease: Secondary | ICD-10-CM | POA: Diagnosis not present

## 2021-03-21 DIAGNOSIS — E875 Hyperkalemia: Secondary | ICD-10-CM | POA: Diagnosis not present

## 2021-03-23 DIAGNOSIS — E875 Hyperkalemia: Secondary | ICD-10-CM | POA: Diagnosis not present

## 2021-03-23 DIAGNOSIS — N2581 Secondary hyperparathyroidism of renal origin: Secondary | ICD-10-CM | POA: Diagnosis not present

## 2021-03-23 DIAGNOSIS — N186 End stage renal disease: Secondary | ICD-10-CM | POA: Diagnosis not present

## 2021-03-23 DIAGNOSIS — Z992 Dependence on renal dialysis: Secondary | ICD-10-CM | POA: Diagnosis not present

## 2021-03-26 DIAGNOSIS — N2581 Secondary hyperparathyroidism of renal origin: Secondary | ICD-10-CM | POA: Diagnosis not present

## 2021-03-26 DIAGNOSIS — Z992 Dependence on renal dialysis: Secondary | ICD-10-CM | POA: Diagnosis not present

## 2021-03-26 DIAGNOSIS — E875 Hyperkalemia: Secondary | ICD-10-CM | POA: Diagnosis not present

## 2021-03-26 DIAGNOSIS — N186 End stage renal disease: Secondary | ICD-10-CM | POA: Diagnosis not present

## 2021-03-28 ENCOUNTER — Telehealth (INDEPENDENT_AMBULATORY_CARE_PROVIDER_SITE_OTHER): Payer: Medicare Other | Admitting: Internal Medicine

## 2021-03-28 ENCOUNTER — Other Ambulatory Visit: Payer: Self-pay

## 2021-03-28 DIAGNOSIS — B20 Human immunodeficiency virus [HIV] disease: Secondary | ICD-10-CM | POA: Diagnosis not present

## 2021-03-28 DIAGNOSIS — Z113 Encounter for screening for infections with a predominantly sexual mode of transmission: Secondary | ICD-10-CM

## 2021-03-28 DIAGNOSIS — N2581 Secondary hyperparathyroidism of renal origin: Secondary | ICD-10-CM | POA: Diagnosis not present

## 2021-03-28 DIAGNOSIS — Z992 Dependence on renal dialysis: Secondary | ICD-10-CM | POA: Diagnosis not present

## 2021-03-28 DIAGNOSIS — N186 End stage renal disease: Secondary | ICD-10-CM | POA: Diagnosis not present

## 2021-03-28 DIAGNOSIS — E875 Hyperkalemia: Secondary | ICD-10-CM | POA: Diagnosis not present

## 2021-03-29 ENCOUNTER — Encounter: Payer: Self-pay | Admitting: Internal Medicine

## 2021-03-29 NOTE — Progress Notes (Signed)
   Subjective:    Patient ID: Brian [name], male    DOB: 06/26/1976, 45 y.o.   MRN: [id number]  HPI I connected with  [name] on 03/29/21 by telephone and verified that I am speaking with the correct person using two identifiers.   I discussed the limitations of evaluation and management by telemedicine. The patient expressed understanding and agreed to proceed.  Location: Patient - home Physician - clinic  Duration of visit: 17 minutes  HPI: visit is for follow up of HIV He has a history of ESRD on intermittent hemodialysis and on Tivicay and Descovy. His CD4 is 728, viral load < 20.  He has had no issues getting, taking or tolerating his medication.  He has no complaints today.  No new medical issues.     Review of Systems  Constitutional: Negative for fatigue.  Gastrointestinal: Negative for diarrhea and nausea.  Skin: Negative for rash.       Objective:   Physical Exam Neurological:     Mental Status: He is alert.  Psychiatric:        Mood and Affect: Mood normal.           Assessment & Plan:

## 2021-03-29 NOTE — Assessment & Plan Note (Signed)
Screened negative 

## 2021-03-29 NOTE — Assessment & Plan Note (Signed)
He continues to do well on his current regimen and will continue the same.  No concerns and he will rtc in 1 year.

## 2021-03-30 DIAGNOSIS — E875 Hyperkalemia: Secondary | ICD-10-CM | POA: Diagnosis not present

## 2021-03-30 DIAGNOSIS — N186 End stage renal disease: Secondary | ICD-10-CM | POA: Diagnosis not present

## 2021-03-30 DIAGNOSIS — N2581 Secondary hyperparathyroidism of renal origin: Secondary | ICD-10-CM | POA: Diagnosis not present

## 2021-03-30 DIAGNOSIS — Z992 Dependence on renal dialysis: Secondary | ICD-10-CM | POA: Diagnosis not present

## 2021-04-04 DIAGNOSIS — Z992 Dependence on renal dialysis: Secondary | ICD-10-CM | POA: Diagnosis not present

## 2021-04-04 DIAGNOSIS — N2581 Secondary hyperparathyroidism of renal origin: Secondary | ICD-10-CM | POA: Diagnosis not present

## 2021-04-04 DIAGNOSIS — E875 Hyperkalemia: Secondary | ICD-10-CM | POA: Diagnosis not present

## 2021-04-04 DIAGNOSIS — N186 End stage renal disease: Secondary | ICD-10-CM | POA: Diagnosis not present

## 2021-04-06 DIAGNOSIS — Z992 Dependence on renal dialysis: Secondary | ICD-10-CM | POA: Diagnosis not present

## 2021-04-06 DIAGNOSIS — E875 Hyperkalemia: Secondary | ICD-10-CM | POA: Diagnosis not present

## 2021-04-06 DIAGNOSIS — N186 End stage renal disease: Secondary | ICD-10-CM | POA: Diagnosis not present

## 2021-04-06 DIAGNOSIS — N2581 Secondary hyperparathyroidism of renal origin: Secondary | ICD-10-CM | POA: Diagnosis not present

## 2021-04-09 DIAGNOSIS — E875 Hyperkalemia: Secondary | ICD-10-CM | POA: Diagnosis not present

## 2021-04-09 DIAGNOSIS — Z992 Dependence on renal dialysis: Secondary | ICD-10-CM | POA: Diagnosis not present

## 2021-04-09 DIAGNOSIS — N2581 Secondary hyperparathyroidism of renal origin: Secondary | ICD-10-CM | POA: Diagnosis not present

## 2021-04-09 DIAGNOSIS — N186 End stage renal disease: Secondary | ICD-10-CM | POA: Diagnosis not present

## 2021-04-10 ENCOUNTER — Other Ambulatory Visit: Payer: Self-pay | Admitting: Internal Medicine

## 2021-04-10 DIAGNOSIS — B20 Human immunodeficiency virus [HIV] disease: Secondary | ICD-10-CM

## 2021-04-11 DIAGNOSIS — Z992 Dependence on renal dialysis: Secondary | ICD-10-CM | POA: Diagnosis not present

## 2021-04-11 DIAGNOSIS — E875 Hyperkalemia: Secondary | ICD-10-CM | POA: Diagnosis not present

## 2021-04-11 DIAGNOSIS — N186 End stage renal disease: Secondary | ICD-10-CM | POA: Diagnosis not present

## 2021-04-11 DIAGNOSIS — N2581 Secondary hyperparathyroidism of renal origin: Secondary | ICD-10-CM | POA: Diagnosis not present

## 2021-04-13 DIAGNOSIS — Z992 Dependence on renal dialysis: Secondary | ICD-10-CM | POA: Diagnosis not present

## 2021-04-13 DIAGNOSIS — E875 Hyperkalemia: Secondary | ICD-10-CM | POA: Diagnosis not present

## 2021-04-13 DIAGNOSIS — N2581 Secondary hyperparathyroidism of renal origin: Secondary | ICD-10-CM | POA: Diagnosis not present

## 2021-04-13 DIAGNOSIS — N186 End stage renal disease: Secondary | ICD-10-CM | POA: Diagnosis not present

## 2021-04-15 DIAGNOSIS — N186 End stage renal disease: Secondary | ICD-10-CM | POA: Diagnosis not present

## 2021-04-15 DIAGNOSIS — I120 Hypertensive chronic kidney disease with stage 5 chronic kidney disease or end stage renal disease: Secondary | ICD-10-CM | POA: Diagnosis not present

## 2021-04-15 DIAGNOSIS — Z992 Dependence on renal dialysis: Secondary | ICD-10-CM | POA: Diagnosis not present

## 2021-04-17 DIAGNOSIS — N2581 Secondary hyperparathyroidism of renal origin: Secondary | ICD-10-CM | POA: Diagnosis not present

## 2021-04-17 DIAGNOSIS — Z992 Dependence on renal dialysis: Secondary | ICD-10-CM | POA: Diagnosis not present

## 2021-04-17 DIAGNOSIS — E875 Hyperkalemia: Secondary | ICD-10-CM | POA: Diagnosis not present

## 2021-04-17 DIAGNOSIS — N186 End stage renal disease: Secondary | ICD-10-CM | POA: Diagnosis not present

## 2021-04-18 ENCOUNTER — Telehealth: Payer: Self-pay

## 2021-04-18 ENCOUNTER — Other Ambulatory Visit: Payer: Self-pay

## 2021-04-18 ENCOUNTER — Encounter: Payer: Medicare Other | Admitting: Student

## 2021-04-18 DIAGNOSIS — E875 Hyperkalemia: Secondary | ICD-10-CM | POA: Diagnosis not present

## 2021-04-18 DIAGNOSIS — Z79891 Long term (current) use of opiate analgesic: Secondary | ICD-10-CM

## 2021-04-18 DIAGNOSIS — N186 End stage renal disease: Secondary | ICD-10-CM | POA: Diagnosis not present

## 2021-04-18 DIAGNOSIS — N2581 Secondary hyperparathyroidism of renal origin: Secondary | ICD-10-CM | POA: Diagnosis not present

## 2021-04-18 DIAGNOSIS — Z992 Dependence on renal dialysis: Secondary | ICD-10-CM | POA: Diagnosis not present

## 2021-04-18 NOTE — Telephone Encounter (Signed)
Last rx written 03/19/21. Last OV 01/26/21. Next OV 05/01/21. UDS 02/29/16.

## 2021-04-18 NOTE — Telephone Encounter (Signed)
Pt is requesting hisoxyCODONE-acetaminophen (PERCOCET) 10-325 MG tablet sent to   [facility], [address] AT [place] Phone:  [phone]  Fax:  [phone]      ( pt appt for Friday the 6th was canceled  And he was given today 04/17/21 which he didn't know about ... he is out of medicine  )

## 2021-04-19 MED ORDER — OXYCODONE-ACETAMINOPHEN 10-325 MG PO TABS: 1.0000 | ORAL_TABLET | Freq: Three times a day (TID) | ORAL | 0 refills | Status: DC | PRN

## 2021-04-20 ENCOUNTER — Encounter: Payer: Medicare Other | Admitting: Student

## 2021-04-20 DIAGNOSIS — Z992 Dependence on renal dialysis: Secondary | ICD-10-CM | POA: Diagnosis not present

## 2021-04-20 DIAGNOSIS — N186 End stage renal disease: Secondary | ICD-10-CM | POA: Diagnosis not present

## 2021-04-20 DIAGNOSIS — N2581 Secondary hyperparathyroidism of renal origin: Secondary | ICD-10-CM | POA: Diagnosis not present

## 2021-04-20 DIAGNOSIS — E875 Hyperkalemia: Secondary | ICD-10-CM | POA: Diagnosis not present

## 2021-04-23 DIAGNOSIS — E875 Hyperkalemia: Secondary | ICD-10-CM | POA: Diagnosis not present

## 2021-04-23 DIAGNOSIS — Z992 Dependence on renal dialysis: Secondary | ICD-10-CM | POA: Diagnosis not present

## 2021-04-23 DIAGNOSIS — N186 End stage renal disease: Secondary | ICD-10-CM | POA: Diagnosis not present

## 2021-04-23 DIAGNOSIS — N2581 Secondary hyperparathyroidism of renal origin: Secondary | ICD-10-CM | POA: Diagnosis not present

## 2021-04-25 DIAGNOSIS — N2581 Secondary hyperparathyroidism of renal origin: Secondary | ICD-10-CM | POA: Diagnosis not present

## 2021-04-25 DIAGNOSIS — N186 End stage renal disease: Secondary | ICD-10-CM | POA: Diagnosis not present

## 2021-04-25 DIAGNOSIS — E875 Hyperkalemia: Secondary | ICD-10-CM | POA: Diagnosis not present

## 2021-04-25 DIAGNOSIS — Z992 Dependence on renal dialysis: Secondary | ICD-10-CM | POA: Diagnosis not present

## 2021-04-29 ENCOUNTER — Encounter: Payer: Self-pay | Admitting: *Deleted

## 2021-04-29 NOTE — Progress Notes (Signed)

## 2021-04-30 DIAGNOSIS — N2581 Secondary hyperparathyroidism of renal origin: Secondary | ICD-10-CM | POA: Diagnosis not present

## 2021-04-30 DIAGNOSIS — E875 Hyperkalemia: Secondary | ICD-10-CM | POA: Diagnosis not present

## 2021-04-30 DIAGNOSIS — N186 End stage renal disease: Secondary | ICD-10-CM | POA: Diagnosis not present

## 2021-04-30 DIAGNOSIS — Z992 Dependence on renal dialysis: Secondary | ICD-10-CM | POA: Diagnosis not present

## 2021-04-30 NOTE — Progress Notes (Signed)
Things That May Be Affecting Your Health:  Alcohol  Hearing loss  Pain    Depression  Home Safety  Sexual Health   Diabetes  Lack of physical activity x Stress   Difficulty with daily activities  Loneliness  Tiredness   Drug use x Medicines  Tobacco use   Falls  Motor Vehicle Safety  Weight  x Food choices  Oral Health  Other    YOUR PERSONALIZED HEALTH PLAN : 1. Schedule your next subsequent Medicare Wellness visit in one year 2. Attend all of your regular appointments to address your medical issues 3. Complete the preventative screenings and services   Annual Wellness Visit   Medicare Covered Preventative Screenings and [place] Men and Women Who How Often Need? Date of Last Service Action  Abdominal Aortic Aneurysm Adults with AAA risk factors Once      Alcohol Misuse and Counseling All Adults Screening once a year if no alcohol misuse. Counseling up to 4 face to face sessions.     Bone Density Measurement  Adults at risk for osteoporosis Once every 2 yrs      Lipid Panel Z13.6 All adults without CV disease Once every 5 yrs       Colorectal Cancer   Stool sample or  Colonoscopy All adults 69 and older   Once every year  Every 10 years x       Depression All Adults Once a year  Today   Diabetes Screening Blood glucose, post glucose load, or GTT Z13.1  All adults at risk  Pre-diabetics  Once per year  Twice per year      Diabetes  Self-Management Training All adults Diabetics 10 hrs first year; 2 hours subsequent years. Requires Copay     Glaucoma  Diabetics  Family history of glaucoma  African Americans 22 yrs +  Hispanic Americans 16 yrs + Annually - requires coppay      Hepatitis C Z72.89 or F19.20  High Risk for HCV  Born between 1945 and 1965  Annually  Once      HIV Z11.4 All adults based on risk  Annually btw ages 32 & 64 regardless of risk  Annually > 65 yrs if at increased risk      Lung Cancer Screening  Asymptomatic adults aged 40-77 with 30 pack yr history and current smoker OR quit within the last 15 yrs Annually Must have counseling and shared decision making documentation before first screen      Medical Nutrition Therapy Adults with   Diabetes  Renal disease  Kidney transplant within past 3 yrs 3 hours first year; 2 hours subsequent years     Obesity and Counseling All adults Screening once a year Counseling if BMI 30 or higher  Today   Tobacco Use Counseling Adults who use tobacco  Up to 8 visits in one year     Vaccines Z23  Hepatitis B  Influenza   Pneumonia  Adults   Once  Once every flu season  Two different vaccines separated by one year     Next Annual Wellness Visit People with Medicare Every year  Today     Services & Screenings Women Who How Often Need  Date of Last Service Action  Mammogram  Z12.31 Women over 64 One baseline ages 59-39. Annually ager 40 yrs+      Pap tests All women Annually if high risk. Every 2 yrs for normal risk women  Screening for cervical cancer with   Pap (Z01.419 nl or Z01.411abnl) &  HPV Z11.51 Women aged 30 to 3 Once every 5 yrs     Screening pelvic and breast exams All women Annually if high risk. Every 2 yrs for normal risk women     Sexually Transmitted Diseases  Chlamydia  Gonorrhea  Syphilis All at risk adults Annually for non pregnant females at increased risk         [place] Men Who How Ofter Need  Date of Last Service Action  Prostate Cancer - DRE & PSA Men over 50 Annually.  DRE might require a copay.        Sexually Transmitted Diseases  Syphilis All at risk adults Annually for men at increased risk      Health Maintenance List Health Maintenance  Topic Date Due  . COLONOSCOPY (Pts 45-46yr Insurance coverage will need to be confirmed)  Never done  . COVID-19 Vaccine (3 - Moderna risk 4-dose series) 04/28/2020  . INFLUENZA VACCINE  07/16/2021  . TETANUS/TDAP  03/21/2026   . PNEUMOCOCCAL POLYSACCHARIDE VACCINE AGE 90-64 HIGH RISK  Completed  . Hepatitis C Screening  Completed  . HIV Screening  Completed  . HPV VACCINES  Aged Out

## 2021-05-01 ENCOUNTER — Other Ambulatory Visit: Payer: Self-pay | Admitting: Internal Medicine

## 2021-05-01 ENCOUNTER — Ambulatory Visit (INDEPENDENT_AMBULATORY_CARE_PROVIDER_SITE_OTHER): Payer: Medicare Other | Admitting: Student

## 2021-05-01 ENCOUNTER — Encounter: Payer: Self-pay | Admitting: Student

## 2021-05-01 DIAGNOSIS — E46 Unspecified protein-calorie malnutrition: Secondary | ICD-10-CM | POA: Diagnosis not present

## 2021-05-01 MED ORDER — DRONABINOL 5 MG PO CAPS: 5.0000 mg | ORAL_CAPSULE | Freq: Two times a day (BID) | ORAL | 2 refills | Status: DC

## 2021-05-01 NOTE — Assessment & Plan Note (Signed)
Brian [name] presents with numerous subacute lesions on bilateral arms, back, and one on right side of nose (along with chronic semi-healing lesions in the same areas) that are all consistent with calciphylaxis.  These are likely caused by long-term hemodialysis for ESRD secondary to polycystic kidney disease.  Prior lab values show high normal calcium levels and severely elevated phosphorus levels.  He is currently on a phosphate binder [name]) per nephrology.  Calciphylaxis, unfortunately, is a poor prognostic indicator for further long-term HD tolerance and typically denotes <50% chance for 1-year survival. As of right now, there is no evidence-based treatment for calciphylaxis and the only benefit seen (albeit minor) is with shorter daily dialysis sessions instead of tri-weekly.

## 2021-05-01 NOTE — Assessment & Plan Note (Signed)
Brian [name] reports loss of appetite for the past few weeks.  He was previously doing well on Marinol 5 mg once daily.  States that history from steak and shrimp scampi, but he has no appetite after just a few bites.  He is asking if he can go up his Marinol dosing to help with his appetite.  PDMP reviewed and is appropriate.  Given history of HIV-associated malnutrition, will increase Marinol to 5 mg twice daily.  Plan: -increased marinol to '5mg'$  with lunch and with dinner
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
## 2021-05-01 NOTE — Patient Instructions (Signed)
Brian [name],  It was a pleasure seeing you in the clinic today.   1. I have increased your marinol to twice daily to help with your appetite.  2. Please speak with your Nephrologist about possibly switching your phosphate binder if you are unable to tolerate it.  3. Please come back in 3 months.  Please call our clinic at [phone] if you have any questions or concerns. The best time to call is Monday-Friday from 9am-4pm, but there is someone available 24/7 at the same number. If you need medication refills, please notify your pharmacy one week in advance and they will send [place] a request.   Thank you for letting us take part in your care. We look forward to seeing you next time!

## 2021-05-02 DIAGNOSIS — N186 End stage renal disease: Secondary | ICD-10-CM | POA: Diagnosis not present

## 2021-05-02 DIAGNOSIS — E875 Hyperkalemia: Secondary | ICD-10-CM | POA: Diagnosis not present

## 2021-05-02 DIAGNOSIS — N2581 Secondary hyperparathyroidism of renal origin: Secondary | ICD-10-CM | POA: Diagnosis not present

## 2021-05-02 DIAGNOSIS — Z992 Dependence on renal dialysis: Secondary | ICD-10-CM | POA: Diagnosis not present

## 2021-05-04 NOTE — Progress Notes (Signed)
Internal Medicine Clinic Attending  Case discussed with Dr. Jinwala  At the time of the visit.  We reviewed the resident's history and exam and pertinent patient test results.  I agree with the assessment, diagnosis, and plan of care documented in the resident's note.  

## 2021-05-07 DIAGNOSIS — N186 End stage renal disease: Secondary | ICD-10-CM | POA: Diagnosis not present

## 2021-05-07 DIAGNOSIS — E875 Hyperkalemia: Secondary | ICD-10-CM | POA: Diagnosis not present

## 2021-05-07 DIAGNOSIS — N2581 Secondary hyperparathyroidism of renal origin: Secondary | ICD-10-CM | POA: Diagnosis not present

## 2021-05-07 DIAGNOSIS — Z992 Dependence on renal dialysis: Secondary | ICD-10-CM | POA: Diagnosis not present

## 2021-05-11 DIAGNOSIS — N186 End stage renal disease: Secondary | ICD-10-CM | POA: Diagnosis not present

## 2021-05-11 DIAGNOSIS — E875 Hyperkalemia: Secondary | ICD-10-CM | POA: Diagnosis not present

## 2021-05-11 DIAGNOSIS — N2581 Secondary hyperparathyroidism of renal origin: Secondary | ICD-10-CM | POA: Diagnosis not present

## 2021-05-11 DIAGNOSIS — Z992 Dependence on renal dialysis: Secondary | ICD-10-CM | POA: Diagnosis not present

## 2021-05-14 DIAGNOSIS — N186 End stage renal disease: Secondary | ICD-10-CM | POA: Diagnosis not present

## 2021-05-14 DIAGNOSIS — N2581 Secondary hyperparathyroidism of renal origin: Secondary | ICD-10-CM | POA: Diagnosis not present

## 2021-05-14 DIAGNOSIS — Z992 Dependence on renal dialysis: Secondary | ICD-10-CM | POA: Diagnosis not present

## 2021-05-14 DIAGNOSIS — E875 Hyperkalemia: Secondary | ICD-10-CM | POA: Diagnosis not present

## 2021-05-16 DIAGNOSIS — N2581 Secondary hyperparathyroidism of renal origin: Secondary | ICD-10-CM | POA: Diagnosis not present

## 2021-05-16 DIAGNOSIS — E875 Hyperkalemia: Secondary | ICD-10-CM | POA: Diagnosis not present

## 2021-05-16 DIAGNOSIS — N186 End stage renal disease: Secondary | ICD-10-CM | POA: Diagnosis not present

## 2021-05-16 DIAGNOSIS — Z992 Dependence on renal dialysis: Secondary | ICD-10-CM | POA: Diagnosis not present

## 2021-05-16 DIAGNOSIS — I120 Hypertensive chronic kidney disease with stage 5 chronic kidney disease or end stage renal disease: Secondary | ICD-10-CM | POA: Diagnosis not present

## 2021-05-18 ENCOUNTER — Other Ambulatory Visit: Payer: Self-pay | Admitting: Student

## 2021-05-18 DIAGNOSIS — Z992 Dependence on renal dialysis: Secondary | ICD-10-CM | POA: Diagnosis not present

## 2021-05-18 DIAGNOSIS — Z79891 Long term (current) use of opiate analgesic: Secondary | ICD-10-CM

## 2021-05-18 DIAGNOSIS — E875 Hyperkalemia: Secondary | ICD-10-CM | POA: Diagnosis not present

## 2021-05-18 DIAGNOSIS — N2581 Secondary hyperparathyroidism of renal origin: Secondary | ICD-10-CM | POA: Diagnosis not present

## 2021-05-18 DIAGNOSIS — N186 End stage renal disease: Secondary | ICD-10-CM | POA: Diagnosis not present

## 2021-05-18 NOTE — Telephone Encounter (Signed)
3 Rxs sent 04/19/2021. Left message on patient's VM to call pharmacy for refill and call [facility] back for any difficulty obtaining.

## 2021-05-18 NOTE — Telephone Encounter (Signed)
MED REFILL REQUEST  oxyCODONE-acetaminophen (PERCOCET) 10-325 MG tablet   [facility] [id number] - [place], [place] - [address] Phone:  [phone]  Fax:  [phone]

## 2021-05-20 ENCOUNTER — Emergency Department (HOSPITAL_COMMUNITY): Payer: Medicare Other

## 2021-05-20 ENCOUNTER — Encounter (HOSPITAL_COMMUNITY): Payer: Self-pay | Admitting: Emergency Medicine

## 2021-05-20 ENCOUNTER — Other Ambulatory Visit: Payer: Self-pay

## 2021-05-20 ENCOUNTER — Emergency Department (HOSPITAL_COMMUNITY)
Admission: EM | Admit: 2021-05-20 | Discharge: 2021-05-21 | Disposition: A | Discharge status: Home / Self Care | Payer: Medicare Other | Attending: Emergency Medicine | Admitting: Emergency Medicine

## 2021-05-20 DIAGNOSIS — I132 Hypertensive heart and chronic kidney disease with heart failure and with stage 5 chronic kidney disease, or end stage renal disease: Secondary | ICD-10-CM | POA: Insufficient documentation

## 2021-05-20 DIAGNOSIS — Z79899 Other long term (current) drug therapy: Secondary | ICD-10-CM | POA: Diagnosis not present

## 2021-05-20 DIAGNOSIS — M549 Dorsalgia, unspecified: Secondary | ICD-10-CM | POA: Diagnosis not present

## 2021-05-20 DIAGNOSIS — Z21 Asymptomatic human immunodeficiency virus [HIV] infection status: Secondary | ICD-10-CM | POA: Insufficient documentation

## 2021-05-20 DIAGNOSIS — Z7951 Long term (current) use of inhaled steroids: Secondary | ICD-10-CM | POA: Insufficient documentation

## 2021-05-20 DIAGNOSIS — Z87891 Personal history of nicotine dependence: Secondary | ICD-10-CM | POA: Insufficient documentation

## 2021-05-20 DIAGNOSIS — Z8616 Personal history of COVID-19: Secondary | ICD-10-CM | POA: Diagnosis not present

## 2021-05-20 DIAGNOSIS — Q446 Cystic disease of liver: Secondary | ICD-10-CM | POA: Diagnosis not present

## 2021-05-20 DIAGNOSIS — J449 Chronic obstructive pulmonary disease, unspecified: Secondary | ICD-10-CM | POA: Diagnosis not present

## 2021-05-20 DIAGNOSIS — S3992XA Unspecified injury of lower back, initial encounter: Secondary | ICD-10-CM | POA: Diagnosis present

## 2021-05-20 DIAGNOSIS — N186 End stage renal disease: Secondary | ICD-10-CM | POA: Diagnosis not present

## 2021-05-20 DIAGNOSIS — I5022 Chronic systolic (congestive) heart failure: Secondary | ICD-10-CM | POA: Insufficient documentation

## 2021-05-20 DIAGNOSIS — S39012A Strain of muscle, fascia and tendon of lower back, initial encounter: Secondary | ICD-10-CM | POA: Diagnosis not present

## 2021-05-20 DIAGNOSIS — R109 Unspecified abdominal pain: Secondary | ICD-10-CM | POA: Diagnosis not present

## 2021-05-20 DIAGNOSIS — W502XXA Accidental twist by another person, initial encounter: Secondary | ICD-10-CM | POA: Diagnosis not present

## 2021-05-20 DIAGNOSIS — R0781 Pleurodynia: Secondary | ICD-10-CM | POA: Insufficient documentation

## 2021-05-20 DIAGNOSIS — I517 Cardiomegaly: Secondary | ICD-10-CM | POA: Diagnosis not present

## 2021-05-20 DIAGNOSIS — N2881 Hypertrophy of kidney: Secondary | ICD-10-CM | POA: Diagnosis not present

## 2021-05-20 DIAGNOSIS — Q612 Polycystic kidney, adult type: Secondary | ICD-10-CM | POA: Diagnosis not present

## 2021-05-20 DIAGNOSIS — Z992 Dependence on renal dialysis: Secondary | ICD-10-CM | POA: Insufficient documentation

## 2021-05-20 DIAGNOSIS — M954 Acquired deformity of chest and rib: Secondary | ICD-10-CM | POA: Diagnosis not present

## 2021-05-20 DIAGNOSIS — R531 Weakness: Secondary | ICD-10-CM | POA: Diagnosis not present

## 2021-05-20 MED ORDER — OXYCODONE-ACETAMINOPHEN 5-325 MG PO TABS
2.0000 | ORAL_TABLET | Freq: Once | ORAL | Status: AC
  Administered 2021-05-20: 2 via ORAL
  Filled 2021-05-20: qty 2

## 2021-05-20 NOTE — ED Provider Notes (Signed)
Emergency Medicine Provider Triage Evaluation Note  Brian [name] , a 45 y.o. male  was evaluated in triage.  Pt complains of pain in his right sided back.  No fevers, cough or shortness of breath.  Doesn't make urine.  Review of Systems  Positive: Pain in flank/back Negative: Fevers, cough, shortness of breath  Physical Exam  BP 132/85 (BP Location: Right Arm)   Pulse (!) 104   Temp 98.6 F (37 C) (Oral)   Resp 18   SpO2 98%  Gen:   Awake, no distress   Resp:  Normal effort  MSK:   Moves extremities without difficulty  Other:  No contusion, rash in area of pain.   Medical Decision Making  Medically screening exam initiated at 4:45 PM.  Appropriate orders placed.  Brian [name] was informed that the remainder of the evaluation will be completed by another provider, this initial triage assessment does not replace that evaluation, and the importance of remaining in the ED until their evaluation is complete.     Lorin Glass, [place] 05/20/21 1648    Valarie Merino, MD 05/20/21 [phone]

## 2021-05-20 NOTE — ED Triage Notes (Signed)
Pt woke up at noon with R sided back/flank pain.  Dialysis on Friday.  States he may have slept on his dog's ball. Denies fever/chills.

## 2021-05-20 NOTE — ED Notes (Signed)
Patient transported to CT 

## 2021-05-21 DIAGNOSIS — S39012A Strain of muscle, fascia and tendon of lower back, initial encounter: Secondary | ICD-10-CM | POA: Diagnosis not present

## 2021-05-21 LAB — I-STAT CHEM 8, ED
BUN: 86 mg/dL — ABNORMAL HIGH (ref 6–20)
Calcium, Ion: 1.09 mmol/L — ABNORMAL LOW (ref 1.15–1.40)
Chloride: 95 mmol/L — ABNORMAL LOW (ref 98–111)
Creatinine, Ser: 11.7 mg/dL — ABNORMAL HIGH (ref 0.61–1.24)
Glucose, Bld: 104 mg/dL — ABNORMAL HIGH (ref 70–99)
HCT: 39 % (ref 39.0–52.0)
Hemoglobin: 13.3 g/dL (ref 13.0–17.0)
Potassium: 4.8 mmol/L (ref 3.5–5.1)
Sodium: 133 mmol/L — ABNORMAL LOW (ref 135–145)
TCO2: 25 mmol/L (ref 22–32)

## 2021-05-21 MED ORDER — METHOCARBAMOL 500 MG PO TABS: 500.0000 mg | ORAL_TABLET | Freq: Two times a day (BID) | ORAL | 0 refills | Status: DC | PRN

## 2021-05-21 MED ORDER — OXYCODONE-ACETAMINOPHEN 10-325 MG PO TABS: 1.0000 | ORAL_TABLET | Freq: Three times a day (TID) | ORAL | 0 refills | Status: DC | PRN
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
## 2021-05-21 NOTE — Telephone Encounter (Signed)
Pt calling back to F/u with his Refill Request  oxyCODONE-acetaminophen (PERCOCET) 10-325 MG tablet  [facility] [id number] - [place], [place] - [address]

## 2021-05-21 NOTE — Telephone Encounter (Signed)
I called [facility] - there are no more refills on Oxycodone. Last rx written 04/19/21. Last OV 05/01/21. Next OV has not been scheduled. UDS 02/29/16.

## 2021-05-21 NOTE — ED Notes (Signed)
Returned from CT.

## 2021-05-23 DIAGNOSIS — E875 Hyperkalemia: Secondary | ICD-10-CM | POA: Diagnosis not present

## 2021-05-23 DIAGNOSIS — N186 End stage renal disease: Secondary | ICD-10-CM | POA: Diagnosis not present

## 2021-05-23 DIAGNOSIS — N2581 Secondary hyperparathyroidism of renal origin: Secondary | ICD-10-CM | POA: Diagnosis not present

## 2021-05-23 DIAGNOSIS — Z992 Dependence on renal dialysis: Secondary | ICD-10-CM | POA: Diagnosis not present

## 2021-05-25 DIAGNOSIS — E875 Hyperkalemia: Secondary | ICD-10-CM | POA: Diagnosis not present

## 2021-05-25 DIAGNOSIS — N186 End stage renal disease: Secondary | ICD-10-CM | POA: Diagnosis not present

## 2021-05-25 DIAGNOSIS — Z992 Dependence on renal dialysis: Secondary | ICD-10-CM | POA: Diagnosis not present

## 2021-05-25 DIAGNOSIS — N2581 Secondary hyperparathyroidism of renal origin: Secondary | ICD-10-CM | POA: Diagnosis not present

## 2021-05-28 DIAGNOSIS — N186 End stage renal disease: Secondary | ICD-10-CM | POA: Diagnosis not present

## 2021-05-28 DIAGNOSIS — N2581 Secondary hyperparathyroidism of renal origin: Secondary | ICD-10-CM | POA: Diagnosis not present

## 2021-05-28 DIAGNOSIS — E875 Hyperkalemia: Secondary | ICD-10-CM | POA: Diagnosis not present

## 2021-05-28 DIAGNOSIS — Z992 Dependence on renal dialysis: Secondary | ICD-10-CM | POA: Diagnosis not present

## 2021-05-30 DIAGNOSIS — N186 End stage renal disease: Secondary | ICD-10-CM | POA: Diagnosis not present

## 2021-05-30 DIAGNOSIS — N2581 Secondary hyperparathyroidism of renal origin: Secondary | ICD-10-CM | POA: Diagnosis not present

## 2021-05-30 DIAGNOSIS — E875 Hyperkalemia: Secondary | ICD-10-CM | POA: Diagnosis not present

## 2021-05-30 DIAGNOSIS — Z992 Dependence on renal dialysis: Secondary | ICD-10-CM | POA: Diagnosis not present

## 2021-06-01 DIAGNOSIS — N186 End stage renal disease: Secondary | ICD-10-CM | POA: Diagnosis not present

## 2021-06-01 DIAGNOSIS — E875 Hyperkalemia: Secondary | ICD-10-CM | POA: Diagnosis not present

## 2021-06-01 DIAGNOSIS — N2581 Secondary hyperparathyroidism of renal origin: Secondary | ICD-10-CM | POA: Diagnosis not present

## 2021-06-01 DIAGNOSIS — Z992 Dependence on renal dialysis: Secondary | ICD-10-CM | POA: Diagnosis not present

## 2021-06-04 DIAGNOSIS — E875 Hyperkalemia: Secondary | ICD-10-CM | POA: Diagnosis not present

## 2021-06-04 DIAGNOSIS — Z992 Dependence on renal dialysis: Secondary | ICD-10-CM | POA: Diagnosis not present

## 2021-06-04 DIAGNOSIS — N2581 Secondary hyperparathyroidism of renal origin: Secondary | ICD-10-CM | POA: Diagnosis not present

## 2021-06-04 DIAGNOSIS — N186 End stage renal disease: Secondary | ICD-10-CM | POA: Diagnosis not present

## 2021-06-08 DIAGNOSIS — N2581 Secondary hyperparathyroidism of renal origin: Secondary | ICD-10-CM | POA: Diagnosis not present

## 2021-06-08 DIAGNOSIS — Z992 Dependence on renal dialysis: Secondary | ICD-10-CM | POA: Diagnosis not present

## 2021-06-08 DIAGNOSIS — N186 End stage renal disease: Secondary | ICD-10-CM | POA: Diagnosis not present

## 2021-06-08 DIAGNOSIS — E875 Hyperkalemia: Secondary | ICD-10-CM | POA: Diagnosis not present

## 2021-06-11 DIAGNOSIS — Z992 Dependence on renal dialysis: Secondary | ICD-10-CM | POA: Diagnosis not present

## 2021-06-11 DIAGNOSIS — N186 End stage renal disease: Secondary | ICD-10-CM | POA: Diagnosis not present

## 2021-06-11 DIAGNOSIS — N2581 Secondary hyperparathyroidism of renal origin: Secondary | ICD-10-CM | POA: Diagnosis not present

## 2021-06-11 DIAGNOSIS — E875 Hyperkalemia: Secondary | ICD-10-CM | POA: Diagnosis not present

## 2021-06-13 DIAGNOSIS — E875 Hyperkalemia: Secondary | ICD-10-CM | POA: Diagnosis not present

## 2021-06-13 DIAGNOSIS — N2581 Secondary hyperparathyroidism of renal origin: Secondary | ICD-10-CM | POA: Diagnosis not present

## 2021-06-13 DIAGNOSIS — Z992 Dependence on renal dialysis: Secondary | ICD-10-CM | POA: Diagnosis not present

## 2021-06-13 DIAGNOSIS — N186 End stage renal disease: Secondary | ICD-10-CM | POA: Diagnosis not present

## 2021-06-15 DIAGNOSIS — I120 Hypertensive chronic kidney disease with stage 5 chronic kidney disease or end stage renal disease: Secondary | ICD-10-CM | POA: Diagnosis not present

## 2021-06-15 DIAGNOSIS — N2581 Secondary hyperparathyroidism of renal origin: Secondary | ICD-10-CM | POA: Diagnosis not present

## 2021-06-15 DIAGNOSIS — D509 Iron deficiency anemia, unspecified: Secondary | ICD-10-CM | POA: Diagnosis not present

## 2021-06-15 DIAGNOSIS — Z992 Dependence on renal dialysis: Secondary | ICD-10-CM | POA: Diagnosis not present

## 2021-06-15 DIAGNOSIS — E875 Hyperkalemia: Secondary | ICD-10-CM | POA: Diagnosis not present

## 2021-06-15 DIAGNOSIS — N186 End stage renal disease: Secondary | ICD-10-CM | POA: Diagnosis not present

## 2021-06-18 DIAGNOSIS — Z992 Dependence on renal dialysis: Secondary | ICD-10-CM | POA: Diagnosis not present

## 2021-06-18 DIAGNOSIS — D509 Iron deficiency anemia, unspecified: Secondary | ICD-10-CM | POA: Diagnosis not present

## 2021-06-18 DIAGNOSIS — N2581 Secondary hyperparathyroidism of renal origin: Secondary | ICD-10-CM | POA: Diagnosis not present

## 2021-06-18 DIAGNOSIS — N186 End stage renal disease: Secondary | ICD-10-CM | POA: Diagnosis not present

## 2021-06-18 DIAGNOSIS — E875 Hyperkalemia: Secondary | ICD-10-CM | POA: Diagnosis not present

## 2021-06-19 ENCOUNTER — Encounter: Payer: Self-pay | Admitting: *Deleted

## 2021-06-20 ENCOUNTER — Other Ambulatory Visit: Payer: Self-pay | Admitting: Student

## 2021-06-20 DIAGNOSIS — Z79891 Long term (current) use of opiate analgesic: Secondary | ICD-10-CM

## 2021-06-20 MED ORDER — OXYCODONE-ACETAMINOPHEN 10-325 MG PO TABS: 1.0000 | ORAL_TABLET | Freq: Three times a day (TID) | ORAL | 0 refills | Status: DC | PRN

## 2021-06-20 NOTE — Telephone Encounter (Signed)
Refill Request  Pt states he is completely out.   oxyCODONE-acetaminophen (PERCOCET) 10-325 MG tablet   [facility] [id number] - [place], [place] - [address] (Ph: [phone])

## 2021-06-20 NOTE — Telephone Encounter (Signed)
Last serum drug screen one year ago. Will need at next visit. Unable to obtain UDS as patient no longer makes urine (ESRD).

## 2021-06-22 DIAGNOSIS — D509 Iron deficiency anemia, unspecified: Secondary | ICD-10-CM | POA: Diagnosis not present

## 2021-06-22 DIAGNOSIS — N186 End stage renal disease: Secondary | ICD-10-CM | POA: Diagnosis not present

## 2021-06-22 DIAGNOSIS — Z992 Dependence on renal dialysis: Secondary | ICD-10-CM | POA: Diagnosis not present

## 2021-06-22 DIAGNOSIS — E875 Hyperkalemia: Secondary | ICD-10-CM | POA: Diagnosis not present

## 2021-06-22 DIAGNOSIS — N2581 Secondary hyperparathyroidism of renal origin: Secondary | ICD-10-CM | POA: Diagnosis not present

## 2021-06-25 DIAGNOSIS — D509 Iron deficiency anemia, unspecified: Secondary | ICD-10-CM | POA: Diagnosis not present

## 2021-06-25 DIAGNOSIS — Z992 Dependence on renal dialysis: Secondary | ICD-10-CM | POA: Diagnosis not present

## 2021-06-25 DIAGNOSIS — E875 Hyperkalemia: Secondary | ICD-10-CM | POA: Diagnosis not present

## 2021-06-25 DIAGNOSIS — N2581 Secondary hyperparathyroidism of renal origin: Secondary | ICD-10-CM | POA: Diagnosis not present

## 2021-06-25 DIAGNOSIS — N186 End stage renal disease: Secondary | ICD-10-CM | POA: Diagnosis not present

## 2021-06-27 DIAGNOSIS — N186 End stage renal disease: Secondary | ICD-10-CM | POA: Diagnosis not present

## 2021-06-27 DIAGNOSIS — Z992 Dependence on renal dialysis: Secondary | ICD-10-CM | POA: Diagnosis not present

## 2021-06-27 DIAGNOSIS — N2581 Secondary hyperparathyroidism of renal origin: Secondary | ICD-10-CM | POA: Diagnosis not present

## 2021-06-27 DIAGNOSIS — D509 Iron deficiency anemia, unspecified: Secondary | ICD-10-CM | POA: Diagnosis not present

## 2021-06-27 DIAGNOSIS — E875 Hyperkalemia: Secondary | ICD-10-CM | POA: Diagnosis not present

## 2021-06-29 DIAGNOSIS — N2581 Secondary hyperparathyroidism of renal origin: Secondary | ICD-10-CM | POA: Diagnosis not present

## 2021-06-29 DIAGNOSIS — N186 End stage renal disease: Secondary | ICD-10-CM | POA: Diagnosis not present

## 2021-06-29 DIAGNOSIS — Z992 Dependence on renal dialysis: Secondary | ICD-10-CM | POA: Diagnosis not present

## 2021-06-29 DIAGNOSIS — E875 Hyperkalemia: Secondary | ICD-10-CM | POA: Diagnosis not present

## 2021-06-29 DIAGNOSIS — D509 Iron deficiency anemia, unspecified: Secondary | ICD-10-CM | POA: Diagnosis not present

## 2021-07-02 DIAGNOSIS — N186 End stage renal disease: Secondary | ICD-10-CM | POA: Diagnosis not present

## 2021-07-02 DIAGNOSIS — N2581 Secondary hyperparathyroidism of renal origin: Secondary | ICD-10-CM | POA: Diagnosis not present

## 2021-07-02 DIAGNOSIS — E875 Hyperkalemia: Secondary | ICD-10-CM | POA: Diagnosis not present

## 2021-07-02 DIAGNOSIS — Z992 Dependence on renal dialysis: Secondary | ICD-10-CM | POA: Diagnosis not present

## 2021-07-02 DIAGNOSIS — D509 Iron deficiency anemia, unspecified: Secondary | ICD-10-CM | POA: Diagnosis not present

## 2021-07-06 DIAGNOSIS — N186 End stage renal disease: Secondary | ICD-10-CM | POA: Diagnosis not present

## 2021-07-06 DIAGNOSIS — E875 Hyperkalemia: Secondary | ICD-10-CM | POA: Diagnosis not present

## 2021-07-06 DIAGNOSIS — D509 Iron deficiency anemia, unspecified: Secondary | ICD-10-CM | POA: Diagnosis not present

## 2021-07-06 DIAGNOSIS — N2581 Secondary hyperparathyroidism of renal origin: Secondary | ICD-10-CM | POA: Diagnosis not present

## 2021-07-06 DIAGNOSIS — Z992 Dependence on renal dialysis: Secondary | ICD-10-CM | POA: Diagnosis not present

## 2021-07-09 DIAGNOSIS — N186 End stage renal disease: Secondary | ICD-10-CM | POA: Diagnosis not present

## 2021-07-09 DIAGNOSIS — E875 Hyperkalemia: Secondary | ICD-10-CM | POA: Diagnosis not present

## 2021-07-09 DIAGNOSIS — D509 Iron deficiency anemia, unspecified: Secondary | ICD-10-CM | POA: Diagnosis not present

## 2021-07-09 DIAGNOSIS — N2581 Secondary hyperparathyroidism of renal origin: Secondary | ICD-10-CM | POA: Diagnosis not present

## 2021-07-09 DIAGNOSIS — Z992 Dependence on renal dialysis: Secondary | ICD-10-CM | POA: Diagnosis not present

## 2021-07-10 ENCOUNTER — Encounter: Payer: Medicare Other | Admitting: Student

## 2021-07-12 DIAGNOSIS — E875 Hyperkalemia: Secondary | ICD-10-CM | POA: Diagnosis not present

## 2021-07-12 DIAGNOSIS — N186 End stage renal disease: Secondary | ICD-10-CM | POA: Diagnosis not present

## 2021-07-12 DIAGNOSIS — D509 Iron deficiency anemia, unspecified: Secondary | ICD-10-CM | POA: Diagnosis not present

## 2021-07-12 DIAGNOSIS — N2581 Secondary hyperparathyroidism of renal origin: Secondary | ICD-10-CM | POA: Diagnosis not present

## 2021-07-12 DIAGNOSIS — Z992 Dependence on renal dialysis: Secondary | ICD-10-CM | POA: Diagnosis not present

## 2021-07-13 DIAGNOSIS — N2581 Secondary hyperparathyroidism of renal origin: Secondary | ICD-10-CM | POA: Diagnosis not present

## 2021-07-13 DIAGNOSIS — D509 Iron deficiency anemia, unspecified: Secondary | ICD-10-CM | POA: Diagnosis not present

## 2021-07-13 DIAGNOSIS — E875 Hyperkalemia: Secondary | ICD-10-CM | POA: Diagnosis not present

## 2021-07-13 DIAGNOSIS — N186 End stage renal disease: Secondary | ICD-10-CM | POA: Diagnosis not present

## 2021-07-13 DIAGNOSIS — Z992 Dependence on renal dialysis: Secondary | ICD-10-CM | POA: Diagnosis not present

## 2021-07-16 DIAGNOSIS — D509 Iron deficiency anemia, unspecified: Secondary | ICD-10-CM | POA: Diagnosis not present

## 2021-07-16 DIAGNOSIS — N2581 Secondary hyperparathyroidism of renal origin: Secondary | ICD-10-CM | POA: Diagnosis not present

## 2021-07-16 DIAGNOSIS — E875 Hyperkalemia: Secondary | ICD-10-CM | POA: Diagnosis not present

## 2021-07-16 DIAGNOSIS — Z992 Dependence on renal dialysis: Secondary | ICD-10-CM | POA: Diagnosis not present

## 2021-07-16 DIAGNOSIS — N186 End stage renal disease: Secondary | ICD-10-CM | POA: Diagnosis not present

## 2021-07-17 ENCOUNTER — Other Ambulatory Visit: Payer: Self-pay

## 2021-07-17 ENCOUNTER — Encounter: Payer: Self-pay | Admitting: Internal Medicine

## 2021-07-17 ENCOUNTER — Ambulatory Visit (INDEPENDENT_AMBULATORY_CARE_PROVIDER_SITE_OTHER): Payer: Medicare Other | Admitting: Internal Medicine

## 2021-07-17 VITALS — BP 128/84 | HR 114 | Temp 97.8°F | Ht 75.0 in | Wt 186.2 lb

## 2021-07-17 DIAGNOSIS — Q613 Polycystic kidney, unspecified: Secondary | ICD-10-CM | POA: Diagnosis not present

## 2021-07-17 DIAGNOSIS — I712 Thoracic aortic aneurysm, without rupture, unspecified: Secondary | ICD-10-CM

## 2021-07-17 DIAGNOSIS — E46 Unspecified protein-calorie malnutrition: Secondary | ICD-10-CM

## 2021-07-17 DIAGNOSIS — Z79891 Long term (current) use of opiate analgesic: Secondary | ICD-10-CM

## 2021-07-17 DIAGNOSIS — F1190 Opioid use, unspecified, uncomplicated: Secondary | ICD-10-CM | POA: Diagnosis not present

## 2021-07-17 DIAGNOSIS — G8929 Other chronic pain: Secondary | ICD-10-CM | POA: Diagnosis not present

## 2021-07-17 MED ORDER — OXYCODONE-ACETAMINOPHEN 10-325 MG PO TABS: 1.0000 | ORAL_TABLET | Freq: Three times a day (TID) | ORAL | 0 refills | Status: DC | PRN

## 2021-07-17 MED ORDER — DRONABINOL 5 MG PO CAPS: 5.0000 mg | ORAL_CAPSULE | Freq: Two times a day (BID) | ORAL | 1 refills | Status: AC

## 2021-07-17 NOTE — Patient Instructions (Signed)
Thank you for trusting me with your care. To recap, today we discussed the following:  Refilled prescriptions below - oxyCODONE-acetaminophen (PERCOCET) 10-325 MG tablet; Take 1 tablet by mouth every 8 (eight) hours as needed for pain.  Dispense: 90 tablet; Refill: 0 - [facility]Antidepr,UR  - dronabinol (MARINOL) 5 MG capsule; Take 1 capsule (5 mg total) by mouth 2 (two) times daily before lunch and supper.  Dispense: 180 capsule; Refill: 1
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
## 2021-07-18 ENCOUNTER — Encounter: Payer: Self-pay | Admitting: Internal Medicine

## 2021-07-18 DIAGNOSIS — F1190 Opioid use, unspecified, uncomplicated: Secondary | ICD-10-CM | POA: Insufficient documentation

## 2021-07-18 NOTE — Assessment & Plan Note (Signed)
Echo can misdiagnose aortic aneurysm. I discussed with patient repeat imaging has been negative for aortic aneurysm. Do not recommend yearly imaging at this time.

## 2021-07-18 NOTE — Assessment & Plan Note (Signed)
Patient is being treated off label for HIV-malnutrition. This is typically used in AIDS patients, but has shown benefit for this patient. He has gained 5 pounds since his last visit, BMI 23. He also is prescribed opioid pain medications which could cause sedation.  He would like to increase the dose, but given he is showing improvement at this dose I recommended continuing current regimen.

## 2021-07-18 NOTE — Progress Notes (Signed)
Internal Medicine Clinic Attending  Case discussed with Dr. Steen  At the time of the visit.  We reviewed the resident's history and exam and pertinent patient test results.  I agree with the assessment, diagnosis, and plan of care documented in the resident's note.  
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
## 2021-07-20 DIAGNOSIS — N186 End stage renal disease: Secondary | ICD-10-CM | POA: Diagnosis not present

## 2021-07-20 DIAGNOSIS — Z992 Dependence on renal dialysis: Secondary | ICD-10-CM | POA: Diagnosis not present

## 2021-07-20 DIAGNOSIS — D509 Iron deficiency anemia, unspecified: Secondary | ICD-10-CM | POA: Diagnosis not present

## 2021-07-20 DIAGNOSIS — N2581 Secondary hyperparathyroidism of renal origin: Secondary | ICD-10-CM | POA: Diagnosis not present

## 2021-07-20 DIAGNOSIS — E875 Hyperkalemia: Secondary | ICD-10-CM | POA: Diagnosis not present

## 2021-07-23 DIAGNOSIS — E875 Hyperkalemia: Secondary | ICD-10-CM | POA: Diagnosis not present

## 2021-07-23 DIAGNOSIS — N2581 Secondary hyperparathyroidism of renal origin: Secondary | ICD-10-CM | POA: Diagnosis not present

## 2021-07-23 DIAGNOSIS — D509 Iron deficiency anemia, unspecified: Secondary | ICD-10-CM | POA: Diagnosis not present

## 2021-07-23 DIAGNOSIS — N186 End stage renal disease: Secondary | ICD-10-CM | POA: Diagnosis not present

## 2021-07-23 DIAGNOSIS — Z992 Dependence on renal dialysis: Secondary | ICD-10-CM | POA: Diagnosis not present

## 2021-07-25 DIAGNOSIS — N2581 Secondary hyperparathyroidism of renal origin: Secondary | ICD-10-CM | POA: Diagnosis not present

## 2021-07-25 DIAGNOSIS — E875 Hyperkalemia: Secondary | ICD-10-CM | POA: Diagnosis not present

## 2021-07-25 DIAGNOSIS — Z992 Dependence on renal dialysis: Secondary | ICD-10-CM | POA: Diagnosis not present

## 2021-07-25 DIAGNOSIS — N186 End stage renal disease: Secondary | ICD-10-CM | POA: Diagnosis not present

## 2021-07-25 DIAGNOSIS — D509 Iron deficiency anemia, unspecified: Secondary | ICD-10-CM | POA: Diagnosis not present

## 2021-07-31 ENCOUNTER — Encounter: Payer: Medicare Other | Admitting: Internal Medicine

## 2021-08-01 DIAGNOSIS — E875 Hyperkalemia: Secondary | ICD-10-CM | POA: Diagnosis not present

## 2021-08-01 DIAGNOSIS — N2581 Secondary hyperparathyroidism of renal origin: Secondary | ICD-10-CM | POA: Diagnosis not present

## 2021-08-01 DIAGNOSIS — Z992 Dependence on renal dialysis: Secondary | ICD-10-CM | POA: Diagnosis not present

## 2021-08-01 DIAGNOSIS — N186 End stage renal disease: Secondary | ICD-10-CM | POA: Diagnosis not present

## 2021-08-01 DIAGNOSIS — D509 Iron deficiency anemia, unspecified: Secondary | ICD-10-CM | POA: Diagnosis not present

## 2021-08-03 DIAGNOSIS — D509 Iron deficiency anemia, unspecified: Secondary | ICD-10-CM | POA: Diagnosis not present

## 2021-08-03 DIAGNOSIS — Z992 Dependence on renal dialysis: Secondary | ICD-10-CM | POA: Diagnosis not present

## 2021-08-03 DIAGNOSIS — E875 Hyperkalemia: Secondary | ICD-10-CM | POA: Diagnosis not present

## 2021-08-03 DIAGNOSIS — N186 End stage renal disease: Secondary | ICD-10-CM | POA: Diagnosis not present

## 2021-08-03 DIAGNOSIS — N2581 Secondary hyperparathyroidism of renal origin: Secondary | ICD-10-CM | POA: Diagnosis not present

## 2021-08-06 DIAGNOSIS — D509 Iron deficiency anemia, unspecified: Secondary | ICD-10-CM | POA: Diagnosis not present

## 2021-08-06 DIAGNOSIS — N2581 Secondary hyperparathyroidism of renal origin: Secondary | ICD-10-CM | POA: Diagnosis not present

## 2021-08-06 DIAGNOSIS — Z992 Dependence on renal dialysis: Secondary | ICD-10-CM | POA: Diagnosis not present

## 2021-08-06 DIAGNOSIS — E875 Hyperkalemia: Secondary | ICD-10-CM | POA: Diagnosis not present

## 2021-08-06 DIAGNOSIS — N186 End stage renal disease: Secondary | ICD-10-CM | POA: Diagnosis not present

## 2021-08-08 DIAGNOSIS — Z992 Dependence on renal dialysis: Secondary | ICD-10-CM | POA: Diagnosis not present

## 2021-08-08 DIAGNOSIS — N186 End stage renal disease: Secondary | ICD-10-CM | POA: Diagnosis not present

## 2021-08-08 DIAGNOSIS — D509 Iron deficiency anemia, unspecified: Secondary | ICD-10-CM | POA: Diagnosis not present

## 2021-08-08 DIAGNOSIS — E875 Hyperkalemia: Secondary | ICD-10-CM | POA: Diagnosis not present

## 2021-08-08 DIAGNOSIS — N2581 Secondary hyperparathyroidism of renal origin: Secondary | ICD-10-CM | POA: Diagnosis not present

## 2021-08-13 DIAGNOSIS — E875 Hyperkalemia: Secondary | ICD-10-CM | POA: Diagnosis not present

## 2021-08-13 DIAGNOSIS — N186 End stage renal disease: Secondary | ICD-10-CM | POA: Diagnosis not present

## 2021-08-13 DIAGNOSIS — N2581 Secondary hyperparathyroidism of renal origin: Secondary | ICD-10-CM | POA: Diagnosis not present

## 2021-08-13 DIAGNOSIS — Z992 Dependence on renal dialysis: Secondary | ICD-10-CM | POA: Diagnosis not present

## 2021-08-13 DIAGNOSIS — D509 Iron deficiency anemia, unspecified: Secondary | ICD-10-CM | POA: Diagnosis not present

## 2021-08-15 DIAGNOSIS — N2581 Secondary hyperparathyroidism of renal origin: Secondary | ICD-10-CM | POA: Diagnosis not present

## 2021-08-15 DIAGNOSIS — N186 End stage renal disease: Secondary | ICD-10-CM | POA: Diagnosis not present

## 2021-08-15 DIAGNOSIS — E875 Hyperkalemia: Secondary | ICD-10-CM | POA: Diagnosis not present

## 2021-08-15 DIAGNOSIS — Z992 Dependence on renal dialysis: Secondary | ICD-10-CM | POA: Diagnosis not present

## 2021-08-15 DIAGNOSIS — D509 Iron deficiency anemia, unspecified: Secondary | ICD-10-CM | POA: Diagnosis not present

## 2021-08-16 DIAGNOSIS — I120 Hypertensive chronic kidney disease with stage 5 chronic kidney disease or end stage renal disease: Secondary | ICD-10-CM | POA: Diagnosis not present

## 2021-08-16 DIAGNOSIS — Z992 Dependence on renal dialysis: Secondary | ICD-10-CM | POA: Diagnosis not present

## 2021-08-16 DIAGNOSIS — N186 End stage renal disease: Secondary | ICD-10-CM | POA: Diagnosis not present

## 2021-08-17 DIAGNOSIS — D509 Iron deficiency anemia, unspecified: Secondary | ICD-10-CM | POA: Diagnosis not present

## 2021-08-17 DIAGNOSIS — N186 End stage renal disease: Secondary | ICD-10-CM | POA: Diagnosis not present

## 2021-08-17 DIAGNOSIS — Z992 Dependence on renal dialysis: Secondary | ICD-10-CM | POA: Diagnosis not present

## 2021-08-17 DIAGNOSIS — N2581 Secondary hyperparathyroidism of renal origin: Secondary | ICD-10-CM | POA: Diagnosis not present

## 2021-08-17 DIAGNOSIS — E875 Hyperkalemia: Secondary | ICD-10-CM | POA: Diagnosis not present

## 2021-08-17 DIAGNOSIS — D631 Anemia in chronic kidney disease: Secondary | ICD-10-CM | POA: Diagnosis not present

## 2021-08-20 DIAGNOSIS — N2581 Secondary hyperparathyroidism of renal origin: Secondary | ICD-10-CM | POA: Diagnosis not present

## 2021-08-20 DIAGNOSIS — E875 Hyperkalemia: Secondary | ICD-10-CM | POA: Diagnosis not present

## 2021-08-20 DIAGNOSIS — D509 Iron deficiency anemia, unspecified: Secondary | ICD-10-CM | POA: Diagnosis not present

## 2021-08-20 DIAGNOSIS — D631 Anemia in chronic kidney disease: Secondary | ICD-10-CM | POA: Diagnosis not present

## 2021-08-20 DIAGNOSIS — N186 End stage renal disease: Secondary | ICD-10-CM | POA: Diagnosis not present

## 2021-08-20 DIAGNOSIS — Z992 Dependence on renal dialysis: Secondary | ICD-10-CM | POA: Diagnosis not present

## 2021-08-21 ENCOUNTER — Other Ambulatory Visit: Payer: Self-pay

## 2021-08-21 DIAGNOSIS — Z79891 Long term (current) use of opiate analgesic: Secondary | ICD-10-CM

## 2021-08-21 NOTE — Telephone Encounter (Signed)
oxyCODONE-acetaminophen (PERCOCET) 10-325 MG tablet, refill request @ [id number] - [place], [place] - [address]. 

## 2021-08-22 MED ORDER — OXYCODONE-ACETAMINOPHEN 10-325 MG PO TABS: 1.0000 | ORAL_TABLET | Freq: Three times a day (TID) | ORAL | 0 refills | Status: DC | PRN

## 2021-08-22 NOTE — Telephone Encounter (Signed)
Last rx written  07/20/21. Last OV 07/17/21. Next OV- has not been scheduled. UDS 02/29/16.

## 2021-08-24 DIAGNOSIS — N2581 Secondary hyperparathyroidism of renal origin: Secondary | ICD-10-CM | POA: Diagnosis not present

## 2021-08-24 DIAGNOSIS — N186 End stage renal disease: Secondary | ICD-10-CM | POA: Diagnosis not present

## 2021-08-24 DIAGNOSIS — Z992 Dependence on renal dialysis: Secondary | ICD-10-CM | POA: Diagnosis not present

## 2021-08-24 DIAGNOSIS — D631 Anemia in chronic kidney disease: Secondary | ICD-10-CM | POA: Diagnosis not present

## 2021-08-24 DIAGNOSIS — D509 Iron deficiency anemia, unspecified: Secondary | ICD-10-CM | POA: Diagnosis not present

## 2021-08-24 DIAGNOSIS — E875 Hyperkalemia: Secondary | ICD-10-CM | POA: Diagnosis not present

## 2021-08-27 DIAGNOSIS — E875 Hyperkalemia: Secondary | ICD-10-CM | POA: Diagnosis not present

## 2021-08-27 DIAGNOSIS — D631 Anemia in chronic kidney disease: Secondary | ICD-10-CM | POA: Diagnosis not present

## 2021-08-27 DIAGNOSIS — Z992 Dependence on renal dialysis: Secondary | ICD-10-CM | POA: Diagnosis not present

## 2021-08-27 DIAGNOSIS — N2581 Secondary hyperparathyroidism of renal origin: Secondary | ICD-10-CM | POA: Diagnosis not present

## 2021-08-27 DIAGNOSIS — N186 End stage renal disease: Secondary | ICD-10-CM | POA: Diagnosis not present

## 2021-08-27 DIAGNOSIS — D509 Iron deficiency anemia, unspecified: Secondary | ICD-10-CM | POA: Diagnosis not present

## 2021-08-31 DIAGNOSIS — Z992 Dependence on renal dialysis: Secondary | ICD-10-CM | POA: Diagnosis not present

## 2021-08-31 DIAGNOSIS — N2581 Secondary hyperparathyroidism of renal origin: Secondary | ICD-10-CM | POA: Diagnosis not present

## 2021-08-31 DIAGNOSIS — E875 Hyperkalemia: Secondary | ICD-10-CM | POA: Diagnosis not present

## 2021-08-31 DIAGNOSIS — N186 End stage renal disease: Secondary | ICD-10-CM | POA: Diagnosis not present

## 2021-08-31 DIAGNOSIS — D509 Iron deficiency anemia, unspecified: Secondary | ICD-10-CM | POA: Diagnosis not present

## 2021-08-31 DIAGNOSIS — D631 Anemia in chronic kidney disease: Secondary | ICD-10-CM | POA: Diagnosis not present

## 2021-09-03 DIAGNOSIS — Z992 Dependence on renal dialysis: Secondary | ICD-10-CM | POA: Diagnosis not present

## 2021-09-03 DIAGNOSIS — N2581 Secondary hyperparathyroidism of renal origin: Secondary | ICD-10-CM | POA: Diagnosis not present

## 2021-09-03 DIAGNOSIS — N186 End stage renal disease: Secondary | ICD-10-CM | POA: Diagnosis not present

## 2021-09-03 DIAGNOSIS — D631 Anemia in chronic kidney disease: Secondary | ICD-10-CM | POA: Diagnosis not present

## 2021-09-06 DIAGNOSIS — Z992 Dependence on renal dialysis: Secondary | ICD-10-CM | POA: Diagnosis not present

## 2021-09-06 DIAGNOSIS — D631 Anemia in chronic kidney disease: Secondary | ICD-10-CM | POA: Diagnosis not present

## 2021-09-06 DIAGNOSIS — N186 End stage renal disease: Secondary | ICD-10-CM | POA: Diagnosis not present

## 2021-09-06 DIAGNOSIS — N2581 Secondary hyperparathyroidism of renal origin: Secondary | ICD-10-CM | POA: Diagnosis not present

## 2021-09-10 DIAGNOSIS — N186 End stage renal disease: Secondary | ICD-10-CM | POA: Diagnosis not present

## 2021-09-10 DIAGNOSIS — Z992 Dependence on renal dialysis: Secondary | ICD-10-CM | POA: Diagnosis not present

## 2021-09-10 DIAGNOSIS — E875 Hyperkalemia: Secondary | ICD-10-CM | POA: Diagnosis not present

## 2021-09-10 DIAGNOSIS — N2581 Secondary hyperparathyroidism of renal origin: Secondary | ICD-10-CM | POA: Diagnosis not present

## 2021-09-10 DIAGNOSIS — D509 Iron deficiency anemia, unspecified: Secondary | ICD-10-CM | POA: Diagnosis not present

## 2021-09-10 DIAGNOSIS — D631 Anemia in chronic kidney disease: Secondary | ICD-10-CM | POA: Diagnosis not present

## 2021-09-12 DIAGNOSIS — E875 Hyperkalemia: Secondary | ICD-10-CM | POA: Diagnosis not present

## 2021-09-12 DIAGNOSIS — D509 Iron deficiency anemia, unspecified: Secondary | ICD-10-CM | POA: Diagnosis not present

## 2021-09-12 DIAGNOSIS — Z992 Dependence on renal dialysis: Secondary | ICD-10-CM | POA: Diagnosis not present

## 2021-09-12 DIAGNOSIS — N186 End stage renal disease: Secondary | ICD-10-CM | POA: Diagnosis not present

## 2021-09-12 DIAGNOSIS — D631 Anemia in chronic kidney disease: Secondary | ICD-10-CM | POA: Diagnosis not present

## 2021-09-12 DIAGNOSIS — N2581 Secondary hyperparathyroidism of renal origin: Secondary | ICD-10-CM | POA: Diagnosis not present

## 2021-09-15 DIAGNOSIS — Z992 Dependence on renal dialysis: Secondary | ICD-10-CM | POA: Diagnosis not present

## 2021-09-15 DIAGNOSIS — N186 End stage renal disease: Secondary | ICD-10-CM | POA: Diagnosis not present

## 2021-09-15 DIAGNOSIS — I120 Hypertensive chronic kidney disease with stage 5 chronic kidney disease or end stage renal disease: Secondary | ICD-10-CM | POA: Diagnosis not present

## 2021-09-17 ENCOUNTER — Other Ambulatory Visit: Payer: Self-pay

## 2021-09-17 DIAGNOSIS — D509 Iron deficiency anemia, unspecified: Secondary | ICD-10-CM | POA: Diagnosis not present

## 2021-09-17 DIAGNOSIS — N2581 Secondary hyperparathyroidism of renal origin: Secondary | ICD-10-CM | POA: Diagnosis not present

## 2021-09-17 DIAGNOSIS — Z79891 Long term (current) use of opiate analgesic: Secondary | ICD-10-CM

## 2021-09-17 DIAGNOSIS — E875 Hyperkalemia: Secondary | ICD-10-CM | POA: Diagnosis not present

## 2021-09-17 DIAGNOSIS — Z23 Encounter for immunization: Secondary | ICD-10-CM | POA: Diagnosis not present

## 2021-09-17 DIAGNOSIS — N186 End stage renal disease: Secondary | ICD-10-CM | POA: Diagnosis not present

## 2021-09-17 DIAGNOSIS — Z992 Dependence on renal dialysis: Secondary | ICD-10-CM | POA: Diagnosis not present

## 2021-09-17 NOTE — Telephone Encounter (Signed)
oxyCODONE-acetaminophen (PERCOCET) 10-325 MG tablet, REFILL REQUEST @ [id number] - [place], [place] - [address] AT [address]. 

## 2021-09-18 MED ORDER — OXYCODONE-ACETAMINOPHEN 10-325 MG PO TABS: 1.0000 | ORAL_TABLET | Freq: Three times a day (TID) | ORAL | 0 refills | Status: DC | PRN

## 2021-09-19 DIAGNOSIS — Z992 Dependence on renal dialysis: Secondary | ICD-10-CM | POA: Diagnosis not present

## 2021-09-19 DIAGNOSIS — N2581 Secondary hyperparathyroidism of renal origin: Secondary | ICD-10-CM | POA: Diagnosis not present

## 2021-09-19 DIAGNOSIS — E875 Hyperkalemia: Secondary | ICD-10-CM | POA: Diagnosis not present

## 2021-09-19 DIAGNOSIS — N186 End stage renal disease: Secondary | ICD-10-CM | POA: Diagnosis not present

## 2021-09-19 DIAGNOSIS — D509 Iron deficiency anemia, unspecified: Secondary | ICD-10-CM | POA: Diagnosis not present

## 2021-09-19 DIAGNOSIS — Z23 Encounter for immunization: Secondary | ICD-10-CM | POA: Diagnosis not present

## 2021-09-22 DIAGNOSIS — Z992 Dependence on renal dialysis: Secondary | ICD-10-CM | POA: Diagnosis not present

## 2021-09-22 DIAGNOSIS — N186 End stage renal disease: Secondary | ICD-10-CM | POA: Diagnosis not present

## 2021-09-22 DIAGNOSIS — D509 Iron deficiency anemia, unspecified: Secondary | ICD-10-CM | POA: Diagnosis not present

## 2021-09-22 DIAGNOSIS — N2581 Secondary hyperparathyroidism of renal origin: Secondary | ICD-10-CM | POA: Diagnosis not present

## 2021-09-22 DIAGNOSIS — E875 Hyperkalemia: Secondary | ICD-10-CM | POA: Diagnosis not present

## 2021-09-22 DIAGNOSIS — Z23 Encounter for immunization: Secondary | ICD-10-CM | POA: Diagnosis not present

## 2021-09-26 DIAGNOSIS — E875 Hyperkalemia: Secondary | ICD-10-CM | POA: Diagnosis not present

## 2021-09-26 DIAGNOSIS — N2581 Secondary hyperparathyroidism of renal origin: Secondary | ICD-10-CM | POA: Diagnosis not present

## 2021-09-26 DIAGNOSIS — N186 End stage renal disease: Secondary | ICD-10-CM | POA: Diagnosis not present

## 2021-09-26 DIAGNOSIS — D509 Iron deficiency anemia, unspecified: Secondary | ICD-10-CM | POA: Diagnosis not present

## 2021-09-26 DIAGNOSIS — Z992 Dependence on renal dialysis: Secondary | ICD-10-CM | POA: Diagnosis not present

## 2021-09-26 DIAGNOSIS — Z23 Encounter for immunization: Secondary | ICD-10-CM | POA: Diagnosis not present

## 2021-09-28 DIAGNOSIS — Z23 Encounter for immunization: Secondary | ICD-10-CM | POA: Diagnosis not present

## 2021-09-28 DIAGNOSIS — Z992 Dependence on renal dialysis: Secondary | ICD-10-CM | POA: Diagnosis not present

## 2021-09-28 DIAGNOSIS — E875 Hyperkalemia: Secondary | ICD-10-CM | POA: Diagnosis not present

## 2021-09-28 DIAGNOSIS — N186 End stage renal disease: Secondary | ICD-10-CM | POA: Diagnosis not present

## 2021-09-28 DIAGNOSIS — N2581 Secondary hyperparathyroidism of renal origin: Secondary | ICD-10-CM | POA: Diagnosis not present

## 2021-09-28 DIAGNOSIS — D509 Iron deficiency anemia, unspecified: Secondary | ICD-10-CM | POA: Diagnosis not present

## 2021-10-01 DIAGNOSIS — E875 Hyperkalemia: Secondary | ICD-10-CM | POA: Diagnosis not present

## 2021-10-01 DIAGNOSIS — D509 Iron deficiency anemia, unspecified: Secondary | ICD-10-CM | POA: Diagnosis not present

## 2021-10-01 DIAGNOSIS — N186 End stage renal disease: Secondary | ICD-10-CM | POA: Diagnosis not present

## 2021-10-01 DIAGNOSIS — Z23 Encounter for immunization: Secondary | ICD-10-CM | POA: Diagnosis not present

## 2021-10-01 DIAGNOSIS — N2581 Secondary hyperparathyroidism of renal origin: Secondary | ICD-10-CM | POA: Diagnosis not present

## 2021-10-01 DIAGNOSIS — Z992 Dependence on renal dialysis: Secondary | ICD-10-CM | POA: Diagnosis not present

## 2021-10-02 ENCOUNTER — Encounter: Payer: Self-pay | Admitting: Student

## 2021-10-02 ENCOUNTER — Other Ambulatory Visit: Payer: Self-pay

## 2021-10-02 ENCOUNTER — Ambulatory Visit (INDEPENDENT_AMBULATORY_CARE_PROVIDER_SITE_OTHER): Payer: Medicare Other | Admitting: Student

## 2021-10-02 DIAGNOSIS — N186 End stage renal disease: Secondary | ICD-10-CM

## 2021-10-02 DIAGNOSIS — G8929 Other chronic pain: Secondary | ICD-10-CM | POA: Diagnosis not present

## 2021-10-02 DIAGNOSIS — E46 Unspecified protein-calorie malnutrition: Secondary | ICD-10-CM

## 2021-10-02 NOTE — Assessment & Plan Note (Signed)
Patient with history of chronic pain 2/2 polycystic kidney and liver disease. He has been on a stable dose of percocet 10-'325mg'$  q8h which has been working well for him. PDMP reviewed and appropriate. He does not need refills at this time, but it is appropriate to refill his medications once needed.

## 2021-10-02 NOTE — Assessment & Plan Note (Signed)
Patient with history of ESRD on HD MWF 2/2 PCKD. He typically does well in terms of adherence during spring, summer, and fall seasons but has difficulties during wintertime. His preferred mode of transportation is via motorcycle, which becomes difficult to use during wintertime. He typically has transportation provided for him, but notes frustration with excessive wait times to and from dialysis sessions. He had a string of hospitalizations for volume overload 2/2 missed HD sessions last wintertime so I counseled him on adherence throughout wintertime this year. He notes that he will try a different method of transportation that may work better for him despite costing him some money. I encouraged him to do so as each missed HD session and hospitalization takes a toll on his health.  Plan: -ensure adherence to HD

## 2021-10-02 NOTE — Patient Instructions (Addendum)
[name],  It was a pleasure seeing you in the clinic today.   Please continue with going to dialysis regularly. If transportation becomes an issue, please give our clinic a call and we will try our best to help with this. I am hoping that your appetite improves with dialysis decreasing your dry weight. We will continue to work on your appetite at future visits. Please let us know a few days in advance when you need a refill on your pain medication.  Please call our clinic at [phone] if you have any questions or concerns. The best time to call is Monday-Friday from 9am-4pm, but there is someone available 24/7 at the same number. If you need medication refills, please notify your pharmacy one week in advance and they will send [place] a request.   Thank you for letting us take part in your care. We look forward to seeing you next time!

## 2021-10-02 NOTE — Assessment & Plan Note (Signed)
Patient with history of HIV-associated malnutrition which we have been managing for the past year. He has required steady increases in dosage of dronabinol. He did find benefit after initial increase of dronabinol to '5mg'$  daily but not so much after increase to '5mg'$  BID. States his appetite has not increased much since BID dosing. He still enjoys to eat and will feel hungry, but once he starts to eat, his appetite becomes minimal. He attributes this to not believing he is at his true dry weight with dialysis. Mentions that he spoke with his nephrologist about decreasing his dry weight as he still has some ankle swelling after dialysis, which per patient is to be implemented at next dialysis visit (will decrease dry weight by 1kg). He is hopeful that his appetite will increase with this.  Discussed that there may be not be any added benefit with increasing dose of dronabinol given he had no improvement during recent dose increase. Will need to continue to record weights at subsequent visits and ensure either improvement or stagnation of current weight. If continuing to lose weight, consider increasing dronabinol dose or frequency although this may not provide much added benefit.  Plan: -continue dronabinol '5mg'$  BID -monitor weights at subsequent visits
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
## 2021-10-03 DIAGNOSIS — N2581 Secondary hyperparathyroidism of renal origin: Secondary | ICD-10-CM | POA: Diagnosis not present

## 2021-10-03 DIAGNOSIS — D509 Iron deficiency anemia, unspecified: Secondary | ICD-10-CM | POA: Diagnosis not present

## 2021-10-03 DIAGNOSIS — Z23 Encounter for immunization: Secondary | ICD-10-CM | POA: Diagnosis not present

## 2021-10-03 DIAGNOSIS — E875 Hyperkalemia: Secondary | ICD-10-CM | POA: Diagnosis not present

## 2021-10-03 DIAGNOSIS — N186 End stage renal disease: Secondary | ICD-10-CM | POA: Diagnosis not present

## 2021-10-03 DIAGNOSIS — Z992 Dependence on renal dialysis: Secondary | ICD-10-CM | POA: Diagnosis not present

## 2021-10-03 NOTE — Progress Notes (Signed)
Internal Medicine Clinic Attending  Case discussed with Dr. Jinwala  At the time of the visit.  We reviewed the resident's history and exam and pertinent patient test results.  I agree with the assessment, diagnosis, and plan of care documented in the resident's note.  

## 2021-10-08 DIAGNOSIS — Z23 Encounter for immunization: Secondary | ICD-10-CM | POA: Diagnosis not present

## 2021-10-08 DIAGNOSIS — Z992 Dependence on renal dialysis: Secondary | ICD-10-CM | POA: Diagnosis not present

## 2021-10-08 DIAGNOSIS — N186 End stage renal disease: Secondary | ICD-10-CM | POA: Diagnosis not present

## 2021-10-08 DIAGNOSIS — N2581 Secondary hyperparathyroidism of renal origin: Secondary | ICD-10-CM | POA: Diagnosis not present

## 2021-10-08 DIAGNOSIS — D509 Iron deficiency anemia, unspecified: Secondary | ICD-10-CM | POA: Diagnosis not present

## 2021-10-08 DIAGNOSIS — E875 Hyperkalemia: Secondary | ICD-10-CM | POA: Diagnosis not present

## 2021-10-12 DIAGNOSIS — Z992 Dependence on renal dialysis: Secondary | ICD-10-CM | POA: Diagnosis not present

## 2021-10-12 DIAGNOSIS — Z23 Encounter for immunization: Secondary | ICD-10-CM | POA: Diagnosis not present

## 2021-10-12 DIAGNOSIS — N186 End stage renal disease: Secondary | ICD-10-CM | POA: Diagnosis not present

## 2021-10-12 DIAGNOSIS — D509 Iron deficiency anemia, unspecified: Secondary | ICD-10-CM | POA: Diagnosis not present

## 2021-10-12 DIAGNOSIS — N2581 Secondary hyperparathyroidism of renal origin: Secondary | ICD-10-CM | POA: Diagnosis not present

## 2021-10-12 DIAGNOSIS — E875 Hyperkalemia: Secondary | ICD-10-CM | POA: Diagnosis not present

## 2021-10-15 DIAGNOSIS — Z992 Dependence on renal dialysis: Secondary | ICD-10-CM | POA: Diagnosis not present

## 2021-10-15 DIAGNOSIS — E875 Hyperkalemia: Secondary | ICD-10-CM | POA: Diagnosis not present

## 2021-10-15 DIAGNOSIS — N186 End stage renal disease: Secondary | ICD-10-CM | POA: Diagnosis not present

## 2021-10-15 DIAGNOSIS — Z23 Encounter for immunization: Secondary | ICD-10-CM | POA: Diagnosis not present

## 2021-10-15 DIAGNOSIS — N2581 Secondary hyperparathyroidism of renal origin: Secondary | ICD-10-CM | POA: Diagnosis not present

## 2021-10-15 DIAGNOSIS — D509 Iron deficiency anemia, unspecified: Secondary | ICD-10-CM | POA: Diagnosis not present

## 2021-10-16 ENCOUNTER — Other Ambulatory Visit: Payer: Self-pay

## 2021-10-16 DIAGNOSIS — N186 End stage renal disease: Secondary | ICD-10-CM | POA: Diagnosis not present

## 2021-10-16 DIAGNOSIS — I120 Hypertensive chronic kidney disease with stage 5 chronic kidney disease or end stage renal disease: Secondary | ICD-10-CM | POA: Diagnosis not present

## 2021-10-16 DIAGNOSIS — Z992 Dependence on renal dialysis: Secondary | ICD-10-CM | POA: Diagnosis not present

## 2021-10-16 DIAGNOSIS — Z79891 Long term (current) use of opiate analgesic: Secondary | ICD-10-CM

## 2021-10-16 NOTE — Telephone Encounter (Signed)
oxyCODONE-acetaminophen (PERCOCET) 10-325 MG tablet, refill request @ [id number] - [place], [place] - [address] AT [address]. 

## 2021-10-19 DIAGNOSIS — Z992 Dependence on renal dialysis: Secondary | ICD-10-CM | POA: Diagnosis not present

## 2021-10-19 DIAGNOSIS — D509 Iron deficiency anemia, unspecified: Secondary | ICD-10-CM | POA: Diagnosis not present

## 2021-10-19 DIAGNOSIS — E875 Hyperkalemia: Secondary | ICD-10-CM | POA: Diagnosis not present

## 2021-10-19 DIAGNOSIS — N2581 Secondary hyperparathyroidism of renal origin: Secondary | ICD-10-CM | POA: Diagnosis not present

## 2021-10-19 DIAGNOSIS — N186 End stage renal disease: Secondary | ICD-10-CM | POA: Diagnosis not present

## 2021-10-19 MED ORDER — OXYCODONE-ACETAMINOPHEN 10-325 MG PO TABS: 1.0000 | ORAL_TABLET | Freq: Three times a day (TID) | ORAL | 0 refills | Status: DC | PRN

## 2021-10-19 NOTE — Telephone Encounter (Signed)
Refilled

## 2021-10-19 NOTE — Telephone Encounter (Signed)
Incoming call from pt  States he called in refill request on 11/01, but hasnt heard anything Will have "team" review request as office will be closing soon and pt will be out of meds

## 2021-10-22 DIAGNOSIS — D509 Iron deficiency anemia, unspecified: Secondary | ICD-10-CM | POA: Diagnosis not present

## 2021-10-22 DIAGNOSIS — E875 Hyperkalemia: Secondary | ICD-10-CM | POA: Diagnosis not present

## 2021-10-22 DIAGNOSIS — N186 End stage renal disease: Secondary | ICD-10-CM | POA: Diagnosis not present

## 2021-10-22 DIAGNOSIS — Z992 Dependence on renal dialysis: Secondary | ICD-10-CM | POA: Diagnosis not present

## 2021-10-22 DIAGNOSIS — N2581 Secondary hyperparathyroidism of renal origin: Secondary | ICD-10-CM | POA: Diagnosis not present

## 2021-10-24 DIAGNOSIS — D509 Iron deficiency anemia, unspecified: Secondary | ICD-10-CM | POA: Diagnosis not present

## 2021-10-24 DIAGNOSIS — Z992 Dependence on renal dialysis: Secondary | ICD-10-CM | POA: Diagnosis not present

## 2021-10-24 DIAGNOSIS — N186 End stage renal disease: Secondary | ICD-10-CM | POA: Diagnosis not present

## 2021-10-24 DIAGNOSIS — N2581 Secondary hyperparathyroidism of renal origin: Secondary | ICD-10-CM | POA: Diagnosis not present

## 2021-10-24 DIAGNOSIS — E875 Hyperkalemia: Secondary | ICD-10-CM | POA: Diagnosis not present

## 2021-10-26 DIAGNOSIS — E875 Hyperkalemia: Secondary | ICD-10-CM | POA: Diagnosis not present

## 2021-10-26 DIAGNOSIS — N2581 Secondary hyperparathyroidism of renal origin: Secondary | ICD-10-CM | POA: Diagnosis not present

## 2021-10-26 DIAGNOSIS — Z992 Dependence on renal dialysis: Secondary | ICD-10-CM | POA: Diagnosis not present

## 2021-10-26 DIAGNOSIS — D509 Iron deficiency anemia, unspecified: Secondary | ICD-10-CM | POA: Diagnosis not present

## 2021-10-26 DIAGNOSIS — N186 End stage renal disease: Secondary | ICD-10-CM | POA: Diagnosis not present

## 2021-10-29 DIAGNOSIS — D509 Iron deficiency anemia, unspecified: Secondary | ICD-10-CM | POA: Diagnosis not present

## 2021-10-29 DIAGNOSIS — E875 Hyperkalemia: Secondary | ICD-10-CM | POA: Diagnosis not present

## 2021-10-29 DIAGNOSIS — N2581 Secondary hyperparathyroidism of renal origin: Secondary | ICD-10-CM | POA: Diagnosis not present

## 2021-10-29 DIAGNOSIS — N186 End stage renal disease: Secondary | ICD-10-CM | POA: Diagnosis not present

## 2021-10-29 DIAGNOSIS — Z992 Dependence on renal dialysis: Secondary | ICD-10-CM | POA: Diagnosis not present

## 2021-10-31 DIAGNOSIS — D509 Iron deficiency anemia, unspecified: Secondary | ICD-10-CM | POA: Diagnosis not present

## 2021-10-31 DIAGNOSIS — E875 Hyperkalemia: Secondary | ICD-10-CM | POA: Diagnosis not present

## 2021-10-31 DIAGNOSIS — N2581 Secondary hyperparathyroidism of renal origin: Secondary | ICD-10-CM | POA: Diagnosis not present

## 2021-10-31 DIAGNOSIS — Z992 Dependence on renal dialysis: Secondary | ICD-10-CM | POA: Diagnosis not present

## 2021-10-31 DIAGNOSIS — N186 End stage renal disease: Secondary | ICD-10-CM | POA: Diagnosis not present

## 2021-11-02 DIAGNOSIS — Z992 Dependence on renal dialysis: Secondary | ICD-10-CM | POA: Diagnosis not present

## 2021-11-02 DIAGNOSIS — D509 Iron deficiency anemia, unspecified: Secondary | ICD-10-CM | POA: Diagnosis not present

## 2021-11-02 DIAGNOSIS — N2581 Secondary hyperparathyroidism of renal origin: Secondary | ICD-10-CM | POA: Diagnosis not present

## 2021-11-02 DIAGNOSIS — E875 Hyperkalemia: Secondary | ICD-10-CM | POA: Diagnosis not present

## 2021-11-02 DIAGNOSIS — N186 End stage renal disease: Secondary | ICD-10-CM | POA: Diagnosis not present

## 2021-11-05 DIAGNOSIS — E875 Hyperkalemia: Secondary | ICD-10-CM | POA: Diagnosis not present

## 2021-11-05 DIAGNOSIS — Z992 Dependence on renal dialysis: Secondary | ICD-10-CM | POA: Diagnosis not present

## 2021-11-05 DIAGNOSIS — N186 End stage renal disease: Secondary | ICD-10-CM | POA: Diagnosis not present

## 2021-11-05 DIAGNOSIS — N2581 Secondary hyperparathyroidism of renal origin: Secondary | ICD-10-CM | POA: Diagnosis not present

## 2021-11-05 DIAGNOSIS — D509 Iron deficiency anemia, unspecified: Secondary | ICD-10-CM | POA: Diagnosis not present

## 2021-11-10 DIAGNOSIS — Z992 Dependence on renal dialysis: Secondary | ICD-10-CM | POA: Diagnosis not present

## 2021-11-10 DIAGNOSIS — N186 End stage renal disease: Secondary | ICD-10-CM | POA: Diagnosis not present

## 2021-11-10 DIAGNOSIS — N2581 Secondary hyperparathyroidism of renal origin: Secondary | ICD-10-CM | POA: Diagnosis not present

## 2021-11-10 DIAGNOSIS — D509 Iron deficiency anemia, unspecified: Secondary | ICD-10-CM | POA: Diagnosis not present

## 2021-11-10 DIAGNOSIS — E875 Hyperkalemia: Secondary | ICD-10-CM | POA: Diagnosis not present

## 2021-11-12 DIAGNOSIS — N2581 Secondary hyperparathyroidism of renal origin: Secondary | ICD-10-CM | POA: Diagnosis not present

## 2021-11-12 DIAGNOSIS — D509 Iron deficiency anemia, unspecified: Secondary | ICD-10-CM | POA: Diagnosis not present

## 2021-11-12 DIAGNOSIS — Z992 Dependence on renal dialysis: Secondary | ICD-10-CM | POA: Diagnosis not present

## 2021-11-12 DIAGNOSIS — E875 Hyperkalemia: Secondary | ICD-10-CM | POA: Diagnosis not present

## 2021-11-12 DIAGNOSIS — N186 End stage renal disease: Secondary | ICD-10-CM | POA: Diagnosis not present

## 2021-11-13 ENCOUNTER — Other Ambulatory Visit: Payer: Self-pay | Admitting: Internal Medicine

## 2021-11-13 DIAGNOSIS — B20 Human immunodeficiency virus [HIV] disease: Secondary | ICD-10-CM

## 2021-11-15 ENCOUNTER — Other Ambulatory Visit: Payer: Self-pay

## 2021-11-15 DIAGNOSIS — Z79891 Long term (current) use of opiate analgesic: Secondary | ICD-10-CM

## 2021-11-16 MED ORDER — OXYCODONE-ACETAMINOPHEN 10-325 MG PO TABS: 1.0000 | ORAL_TABLET | Freq: Three times a day (TID) | ORAL | 0 refills | Status: AC | PRN

## 2021-12-16 DIAGNOSIS — 419620001 Death: Secondary | SNOMED CT | POA: Diagnosis not present

## 2021-12-16 DEATH — deceased

## 2022-03-18 ENCOUNTER — Telehealth: Payer: Self-pay | Admitting: *Deleted

## 2022-03-18 NOTE — Chronic Care Management (AMB) (Signed)
?  Care Management  ? ?Outreach Note ? ?03/18/2022 ?Name: Brian [name] MRN: [id number] DOB: October 23, 1976 ? ?Referred by: [name], MD ?Reason for referral : Care Coordination (Outreach to schedule with RNCM ) ? ? ?An unsuccessful telephone outreach was attempted today. The patient was referred to the case management team for assistance with care management and care coordination.  ? ?Follow Up Plan:  ?A HIPAA compliant phone message was left for the patient providing contact information and requesting a return call.  ?The care management team will reach out to the patient again over the next 7 days.  ?If patient returns call to provider office, please advise to call [place]* at [phone].* ? ?Laverda Sorenson  ?Care Guide, Embedded Care Coordination ?[place]  Care Management  ?Direct Dial: [phone] ? ?

## 2022-03-20 NOTE — Chronic Care Management (AMB) (Signed)
?  Care Management  ? ?Outreach Note ? ?03/20/2022 ?Name: Brian [name] MRN: [id number] DOB: 02-02-1976 ? ?Referred by: [name], MD ?Reason for referral : Care Coordination (Outreach to schedule with RNCM ) ? ? ?An unsuccessful telephone outreach was attempted today. The patient was referred to the case management team for assistance with care management and care coordination.  ? ?Follow Up Plan:  ?A HIPAA compliant phone message was left for the patient providing contact information and requesting a return call.  ?The care management team will reach out to the patient again over the next 14 days.  ?If patient returns call to provider office, please advise to call [place] * at [phone].* ? ?Laverda Sorenson  ?Care Guide, Embedded Care Coordination ?[place]  Care Management  ?Direct Dial: [phone] ? ?

## 2022-03-28 ENCOUNTER — Telehealth: Payer: Self-pay

## 2022-03-28 NOTE — Telephone Encounter (Signed)
Called patient to offer appointment, no answer and unable to leave message.  ? ?Beryle Flock, RN ? ?

## 2022-04-02 NOTE — Chronic Care Management (AMB) (Signed)
?  Care Management  ? ?Outreach Note ? ?04/02/2022 ?Name: Brian [name] MRN: [id number] DOB: [birth year] ? ? ?Reason for referral : Care Coordination (Outreach to schedule with RNCM ) ? ? ?Third unsuccessful telephone outreach was attempted today. The patient was referred to the case management team for assistance with care management and care coordination. The patient's primary care provider has been notified of our unsuccessful attempts to make or maintain contact with the patient. The care management team is pleased to engage with this patient at any time in the future should he/she be interested in assistance from the care management team.  ? ?Follow Up Plan:  ?If patient returns call to provider office, please advise to call [place] at [phone]. ? ?Laverda Sorenson  ?Care Guide, Embedded Care Coordination ?[place]  Care Management  ?Direct Dial: [phone] ? ?

## 2022-09-03 ENCOUNTER — Telehealth: Payer: Self-pay

## 2022-09-03 NOTE — Patient Outreach (Signed)
  Care Coordination   09/03/2022 Name: UKIAH TRAWICK MRN: [id number] DOB: [birth year]   Care Coordination Outreach Attempts:  An unsuccessful telephone outreach was attempted today to offer the patient information about available care coordination services as a benefit of their health plan.   Follow Up Plan:  Additional outreach attempts will be made to offer the patient care coordination information and services.   Encounter Outcome:  No Answer  Care Coordination Interventions Activated:  No   Care Coordination Interventions:  No, not indicated    Johnney Killian, RN, BSN, CCM Care Management Coordinator [facility] Phone: [phone]: [phone]
